# Patient Record
Sex: Male | Born: 1960 | Race: White | Hispanic: No | Smoking: Never smoker
Health system: Southern US, Community
[De-identification: ages and names within clinical notes are randomized; demographics above are authoritative.]

## PROBLEM LIST (undated history)

## (undated) DIAGNOSIS — I10 Essential (primary) hypertension: Secondary | ICD-10-CM

## (undated) DIAGNOSIS — D509 Iron deficiency anemia, unspecified: Secondary | ICD-10-CM

## (undated) DIAGNOSIS — A4902 Methicillin resistant Staphylococcus aureus infection, unspecified site: Secondary | ICD-10-CM

## (undated) DIAGNOSIS — R7303 Prediabetes: Secondary | ICD-10-CM

## (undated) DIAGNOSIS — M1990 Unspecified osteoarthritis, unspecified site: Secondary | ICD-10-CM

## (undated) HISTORY — PX: COLONOSCOPY: SHX174

## (undated) MED FILL — Dexamethasone Sodium Phosphate Inj 100 MG/10ML: INTRAMUSCULAR | Qty: 1 | Status: AC

---

## 2011-12-06 ENCOUNTER — Emergency Department (HOSPITAL_COMMUNITY)
Admission: EM | Admit: 2011-12-06 | Discharge: 2011-12-06 | Disposition: A | Discharge status: Home / Self Care | Payer: BC Managed Care – PPO | Attending: Emergency Medicine | Admitting: Emergency Medicine

## 2011-12-06 ENCOUNTER — Encounter (HOSPITAL_COMMUNITY): Payer: Self-pay | Admitting: *Deleted

## 2011-12-06 ENCOUNTER — Emergency Department (HOSPITAL_COMMUNITY): Payer: BC Managed Care – PPO

## 2011-12-06 DIAGNOSIS — IMO0002 Reserved for concepts with insufficient information to code with codable children: Secondary | ICD-10-CM | POA: Insufficient documentation

## 2011-12-06 DIAGNOSIS — Y929 Unspecified place or not applicable: Secondary | ICD-10-CM | POA: Insufficient documentation

## 2011-12-06 DIAGNOSIS — I10 Essential (primary) hypertension: Secondary | ICD-10-CM | POA: Insufficient documentation

## 2011-12-06 DIAGNOSIS — Y939 Activity, unspecified: Secondary | ICD-10-CM | POA: Insufficient documentation

## 2011-12-06 DIAGNOSIS — S43409A Unspecified sprain of unspecified shoulder joint, initial encounter: Secondary | ICD-10-CM

## 2011-12-06 DIAGNOSIS — R296 Repeated falls: Secondary | ICD-10-CM | POA: Insufficient documentation

## 2011-12-06 HISTORY — DX: Essential (primary) hypertension: I10

## 2011-12-06 MED ORDER — IBUPROFEN 800 MG PO TABS: 800.0000 mg | ORAL_TABLET | Freq: Three times a day (TID) | ORAL | Status: DC

## 2011-12-06 MED ORDER — OXYCODONE-ACETAMINOPHEN 5-325 MG PO TABS
2.0000 | ORAL_TABLET | Freq: Once | ORAL | Status: AC
  Administered 2011-12-06: 2 via ORAL
  Filled 2011-12-06: qty 2

## 2011-12-06 MED ORDER — OXYCODONE-ACETAMINOPHEN 5-325 MG PO TABS: 1.0000 | ORAL_TABLET | ORAL | Status: AC | PRN

## 2011-12-06 MED ORDER — OXYCODONE-ACETAMINOPHEN 5-325 MG PO TABS: ORAL_TABLET | ORAL | Status: DC

## 2011-12-06 NOTE — ED Notes (Signed)
Pt states tripped and fell, landed on rt shoulder, has pain radiating up, states unable to move arm.

## 2011-12-07 NOTE — ED Provider Notes (Signed)
Medical screening examination/treatment/procedure(s) were performed by non-physician practitioner and as supervising physician I was immediately available for consultation/collaboration.   Smaran Gaus L Shanta Dorvil, MD 12/07/11 2321 

## 2011-12-07 NOTE — ED Provider Notes (Signed)
History     CSN: [id number]  Arrival date & time 12/06/11  2001   First MD Initiated Contact with Patient 12/06/11 2024      Chief Complaint  Patient presents with  . Shoulder Injury    (Consider location/radiation/quality/duration/timing/severity/associated sxs/prior treatment) HPI Comments: Patient c/o right shoulder pain that began after a fall, landed on the shoulder.  Patient states he is unable to extend the right arm .  He denies numbness weakness, neck or chest pain.    Patient is a 51 y.o. male presenting with shoulder injury. The history is provided by the patient.  Shoulder Injury This is a new problem. The current episode started today. The problem occurs constantly. The problem has been unchanged. Associated symptoms include arthralgias. Pertinent negatives include no abdominal pain, chest pain, chills, fever, headaches, joint swelling, nausea, neck pain, numbness, rash, sore throat, swollen glands or weakness. Exacerbated by: movement or palpation. He has tried nothing for the symptoms. The treatment provided no relief.    Past Medical History  Diagnosis Date  . Hypertension     History reviewed. No pertinent past surgical history.  History reviewed. No pertinent family history.  History  Substance Use Topics  . Smoking status: Never Smoker   . Smokeless tobacco: Not on file  . Alcohol Use: No      Review of Systems  Constitutional: Negative for fever and chills.  HENT: Negative for sore throat and neck pain.   Respiratory: Negative for shortness of breath.   Cardiovascular: Negative for chest pain.  Gastrointestinal: Negative for nausea and abdominal pain.  Genitourinary: Negative for dysuria and difficulty urinating.  Musculoskeletal: Positive for arthralgias. Negative for back pain and joint swelling.  Skin: Negative for color change, rash and wound.  Neurological: Negative for weakness, numbness and headaches.  All other systems reviewed and are  negative.    Allergies  Codeine  Home Medications   Current Outpatient Rx  Name Route Sig Dispense Refill  . IBUPROFEN 800 MG PO TABS Oral Take 1 tablet (800 mg total) by mouth 3 (three) times daily. With food 21 tablet 0  . OXYCODONE-ACETAMINOPHEN 5-325 MG PO TABS Oral Take 1 tablet by mouth every 4 (four) hours as needed for pain. 6 tablet 0    Dispense as pre-pack  . OXYCODONE-ACETAMINOPHEN 5-325 MG PO TABS  Take one-two tabs po q 4-6 hrs prn pain 20 tablet 0    BP 169/90  Pulse 74  Temp 98.4 F (36.9 C) (Oral)  Resp 18  Ht 5\' 11"  (1.803 m)  Wt 279 lb (126.554 kg)  BMI 38.91 kg/m2  SpO2 98%  Physical Exam  Nursing note and vitals reviewed. Constitutional: He is oriented to person, place, and time. He appears well-developed and well-nourished. No distress.  HENT:  Head: Normocephalic and atraumatic.  Neck: Normal range of motion, full passive range of motion without pain and phonation normal. Neck supple. No thyromegaly present.  Cardiovascular: Normal rate, regular rhythm, normal heart sounds and intact distal pulses.   No murmur heard. Pulmonary/Chest: Effort normal and breath sounds normal. No respiratory distress. He exhibits no tenderness.  Musculoskeletal: He exhibits tenderness. He exhibits no edema.       Right shoulder: He exhibits decreased range of motion, tenderness, bony tenderness and pain. He exhibits no swelling, no effusion, no crepitus, no deformity, no laceration, no spasm, normal pulse and normal strength.       Arms:      ttp of the anterior right  shoulder.  Pain with abduction of the right arm and unable to fully abduct or rotate the shoulder due to level of pain.  Radial pulse is brisk, distal sensation intact, CR< 2 sec.  No abrasions, edema or deformity of the joint. Pt has full ROM of the elbow and wrist, grip strength is strong and equal bilaterally  Lymphadenopathy:    He has no cervical adenopathy.  Neurological: He is alert and oriented to  person, place, and time. He exhibits normal muscle tone. Coordination normal.  Skin: Skin is warm and dry.    ED Course  Procedures (including critical care time)  Labs Reviewed - No data to display Dg Shoulder Right  12/06/2011  *RADIOLOGY REPORT*  Clinical Data: Right shoulder pain/injury  RIGHT SHOULDER - 2+ VIEW  Comparison: None.  Findings: No fracture or dislocation is seen.  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  The joint spaces are preserved.  The visualized soft tissues are unremarkable.  Visualized right lung is clear.  IMPRESSION: No fracture or dislocation is seen.   Original Report Authenticated By: Charline Bills, M.D.      1. Shoulder sprain       MDM    Sling applied by nursing staff, pain improved, remains NV intact  Concerning for rotator cuff injury.  Pt agrees to close ortho f/u.  Referral given for Dr. Romeo Apple.  Prescribed: Percocet #20 ibuprofen     Alsha Meland L. [place], [place] 12/07/11 1637

## 2011-12-09 MED FILL — Oxycodone w/ Acetaminophen Tab 5-325 MG: ORAL | Qty: 6 | Status: AC

## 2013-06-18 ENCOUNTER — Encounter (HOSPITAL_COMMUNITY): Payer: Self-pay | Admitting: Emergency Medicine

## 2013-06-18 ENCOUNTER — Emergency Department (HOSPITAL_COMMUNITY)
Admission: EM | Admit: 2013-06-18 | Discharge: 2013-06-18 | Disposition: A | Discharge status: Home / Self Care | Payer: BC Managed Care – PPO | Attending: Emergency Medicine | Admitting: Emergency Medicine

## 2013-06-18 DIAGNOSIS — Y9389 Activity, other specified: Secondary | ICD-10-CM | POA: Insufficient documentation

## 2013-06-18 DIAGNOSIS — S335XXA Sprain of ligaments of lumbar spine, initial encounter: Secondary | ICD-10-CM | POA: Insufficient documentation

## 2013-06-18 DIAGNOSIS — I10 Essential (primary) hypertension: Secondary | ICD-10-CM | POA: Insufficient documentation

## 2013-06-18 DIAGNOSIS — X500XXA Overexertion from strenuous movement or load, initial encounter: Secondary | ICD-10-CM | POA: Insufficient documentation

## 2013-06-18 DIAGNOSIS — Y929 Unspecified place or not applicable: Secondary | ICD-10-CM | POA: Insufficient documentation

## 2013-06-18 DIAGNOSIS — Z791 Long term (current) use of non-steroidal anti-inflammatories (NSAID): Secondary | ICD-10-CM | POA: Insufficient documentation

## 2013-06-18 DIAGNOSIS — Z79899 Other long term (current) drug therapy: Secondary | ICD-10-CM | POA: Insufficient documentation

## 2013-06-18 DIAGNOSIS — R209 Unspecified disturbances of skin sensation: Secondary | ICD-10-CM | POA: Insufficient documentation

## 2013-06-18 DIAGNOSIS — S39012A Strain of muscle, fascia and tendon of lower back, initial encounter: Secondary | ICD-10-CM

## 2013-06-18 DIAGNOSIS — X503XXA Overexertion from repetitive movements, initial encounter: Secondary | ICD-10-CM | POA: Insufficient documentation

## 2013-06-18 MED ORDER — IBUPROFEN 800 MG PO TABS: 800.0000 mg | ORAL_TABLET | Freq: Three times a day (TID) | ORAL | Status: DC

## 2013-06-18 MED ORDER — METHOCARBAMOL 500 MG PO TABS: 1000.0000 mg | ORAL_TABLET | Freq: Three times a day (TID) | ORAL | Status: DC

## 2013-06-18 NOTE — ED Notes (Signed)
Patient given discharge instruction, verbalized understand. Patient ambulatory out of the department.  

## 2013-06-18 NOTE — Discharge Instructions (Signed)
Lumbosacral Strain Lumbosacral strain is a strain of any of the parts that make up your lumbosacral vertebrae. Your lumbosacral vertebrae are the bones that make up the lower third of your backbone. Your lumbosacral vertebrae are held together by muscles and tough, fibrous tissue (ligaments).  CAUSES  A sudden blow to your back can cause lumbosacral strain. Also, anything that causes an excessive stretch of the muscles in the low back can cause this strain. This is typically seen when people exert themselves strenuously, fall, lift heavy objects, bend, or crouch repeatedly. RISK FACTORS  Physically demanding work.  Participation in pushing or pulling sports or sports that require sudden twist of the back (tennis, golf, baseball).  Weight lifting.  Excessive lower back curvature.  Forward-tilted pelvis.  Weak back or abdominal muscles or both.  Tight hamstrings. SIGNS AND SYMPTOMS  Lumbosacral strain may cause pain in the area of your injury or pain that moves (radiates) down your leg.  DIAGNOSIS Your health care provider can often diagnose lumbosacral strain through a physical exam. In some cases, you may need tests such as X-ray exams.  TREATMENT  Treatment for your lower back injury depends on many factors that your clinician will have to evaluate. However, most treatment will include the use of anti-inflammatory medicines. HOME CARE INSTRUCTIONS   Avoid hard physical activities (tennis, racquetball, waterskiing) if you are not in proper physical condition for it. This may aggravate or create problems.  If you have a back problem, avoid sports requiring sudden body movements. Swimming and walking are generally safer activities.  Maintain good posture.  Maintain a healthy weight.  For acute conditions, you may put ice on the injured area.  Put ice in a plastic bag.  Place a towel between your skin and the bag.  Leave the ice on for 20 minutes, 2 3 times a day.  When the  low back starts healing, stretching and strengthening exercises may be recommended. SEEK MEDICAL CARE IF:  Your back pain is getting worse.  You experience severe back pain not relieved with medicines. SEEK IMMEDIATE MEDICAL CARE IF:   You have numbness, tingling, weakness, or problems with the use of your arms or legs.  There is a change in bowel or bladder control.  You have increasing pain in any area of the body, including your belly (abdomen).  You notice shortness of breath, dizziness, or feel faint.  You feel sick to your stomach (nauseous), are throwing up (vomiting), or become sweaty.  You notice discoloration of your toes or legs, or your feet get very cold. MAKE SURE YOU:   Understand these instructions.  Will watch your condition.  Will get help right away if you are not doing well or get worse. Document Released: 11/07/2004 Document Revised: 11/18/2012 Document Reviewed: 09/16/2012 [employer] Patient Information 2014 [employer].  

## 2013-06-18 NOTE — ED Notes (Signed)
Low back pain for 2 days, does a lot of lifting at work

## 2013-06-20 NOTE — ED Provider Notes (Signed)
Medical screening examination/treatment/procedure(s) were performed by non-physician practitioner and as supervising physician I was immediately available for consultation/collaboration.   EKG Interpretation None        Maudry Diego, MD 06/20/13 2132

## 2013-06-20 NOTE — ED Provider Notes (Signed)
CSN: [id number]     Arrival date & time 06/18/13  1855 History   First MD Initiated Contact with Patient 06/18/13 2038     Chief Complaint  Patient presents with  . Back Pain     (Consider location/radiation/quality/duration/timing/severity/associated sxs/prior Treatment) Patient is a 53 y.o. male presenting with back pain. The history is provided by the patient.  Back Pain Location:  Lumbar spine Quality:  Aching and stiffness Stiffness is present:  All day Radiates to:  Does not radiate Pain severity:  Moderate Pain is:  Same all the time Onset quality:  Gradual Duration:  2 days Timing:  Constant Progression:  Unchanged Chronicity:  New Context: lifting heavy objects and twisting   Relieved by:  Nothing Worsened by:  Bending, twisting, standing and sitting Ineffective treatments:  OTC medications Associated symptoms: paresthesias   Associated symptoms: no abdominal pain, no abdominal swelling, no bladder incontinence, no bowel incontinence, no chest pain, no dysuria, no fever, no headaches, no leg pain, no numbness, no pelvic pain, no perianal numbness, no tingling and no weakness     Past Medical History  Diagnosis Date  . Hypertension    History reviewed. No pertinent past surgical history. History reviewed. No pertinent family history. History  Substance Use Topics  . Smoking status: Never Smoker   . Smokeless tobacco: Not on file  . Alcohol Use: No    Review of Systems  Constitutional: Negative for fever.  Respiratory: Negative for shortness of breath.   Cardiovascular: Negative for chest pain.  Gastrointestinal: Negative for vomiting, abdominal pain, constipation and bowel incontinence.  Genitourinary: Negative for bladder incontinence, dysuria, hematuria, flank pain, decreased urine volume, difficulty urinating and pelvic pain.       No perineal numbness or incontinence of urine or feces  Musculoskeletal: Positive for back pain. Negative for joint swelling.   Skin: Negative for rash.  Neurological: Positive for paresthesias. Negative for tingling, weakness, numbness and headaches.  All other systems reviewed and are negative.     Allergies  Codeine  Home Medications   Prior to Admission medications   Medication Sig Start Date End Date Taking? Authorizing Provider  ibuprofen (ADVIL,MOTRIN) 800 MG tablet Take 1 tablet (800 mg total) by mouth 3 (three) times daily. With food 12/06/11   Breonna Gafford L. Jayzon Taras, PA-C  ibuprofen (ADVIL,MOTRIN) 800 MG tablet Take 1 tablet (800 mg total) by mouth 3 (three) times daily. 06/18/13   Markos Theil L. Vestal Markin, PA-C  methocarbamol (ROBAXIN) 500 MG tablet Take 2 tablets (1,000 mg total) by mouth 3 (three) times daily. 06/18/13   Devonia Farro L. Latia Mataya, PA-C  oxyCODONE-acetaminophen (PERCOCET/ROXICET) 5-325 MG per tablet Take one-two tabs po q 4-6 hrs prn pain 12/06/11   Murdock Jellison L. Arcelia Pals, PA-C   BP 169/85  Pulse 95  Temp(Src) 99.4 F (37.4 C) (Oral)  Resp 18  Ht 5\' 11"  (1.803 m)  Wt 265 lb (120.203 kg)  BMI 36.98 kg/m2  SpO2 96% Physical Exam  Nursing note and vitals reviewed. Constitutional: He is oriented to person, place, and time. He appears well-developed and well-nourished. No distress.  HENT:  Head: Normocephalic and atraumatic.  Neck: Normal range of motion. Neck supple.  Cardiovascular: Normal rate, regular rhythm, normal heart sounds and intact distal pulses.   No murmur heard. Pulmonary/Chest: Effort normal and breath sounds normal. No respiratory distress.  Abdominal: Soft. He exhibits no distension. There is no tenderness. There is no rebound and no guarding.  Musculoskeletal: He exhibits tenderness. He exhibits no edema.  Lumbar back: He exhibits tenderness and pain. He exhibits normal range of motion, no swelling, no deformity, no laceration and normal pulse.  Diffuse ttp of the lumbar paraspinal muscles.  No spinal tenderness.  DP pulses are brisk and symmetrical.  Distal sensation intact.  Hip  Flexors/Extensors are intact.  Pt has normal strength against resistance of bilateral lower extremities.     Neurological: He is alert and oriented to person, place, and time. He has normal strength. No sensory deficit. He exhibits normal muscle tone. Coordination and gait normal.  Reflex Scores:      Patellar reflexes are 2+ on the right side and 2+ on the left side.      Achilles reflexes are 2+ on the right side and 2+ on the left side. Skin: Skin is warm and dry. No rash noted.    ED Course  Procedures (including critical care time) Labs Review Labs Reviewed - No data to display  Imaging Review No results found.   EKG Interpretation None      MDM   Final diagnoses:  Lumbar strain     Pt is well appearing, no focal neuro deficits.  Ambulates with a steady gait.  No concerning sx's for emergent neurological or infectious process.  He agrees to symptomatic treatment with percocet, robaxin and ibuprofen   Hawraa Stambaugh L. Vanessa Chatham, PA-C 06/20/13 2001

## 2014-01-20 ENCOUNTER — Emergency Department (HOSPITAL_COMMUNITY)
Admission: EM | Admit: 2014-01-20 | Discharge: 2014-01-20 | Disposition: A | Discharge status: Home / Self Care | Payer: BC Managed Care – PPO | Attending: Emergency Medicine | Admitting: Emergency Medicine

## 2014-01-20 ENCOUNTER — Encounter (HOSPITAL_COMMUNITY): Payer: Self-pay | Admitting: Emergency Medicine

## 2014-01-20 DIAGNOSIS — J029 Acute pharyngitis, unspecified: Secondary | ICD-10-CM | POA: Diagnosis present

## 2014-01-20 DIAGNOSIS — I10 Essential (primary) hypertension: Secondary | ICD-10-CM | POA: Insufficient documentation

## 2014-01-20 DIAGNOSIS — Z79899 Other long term (current) drug therapy: Secondary | ICD-10-CM | POA: Insufficient documentation

## 2014-01-20 DIAGNOSIS — J069 Acute upper respiratory infection, unspecified: Secondary | ICD-10-CM | POA: Diagnosis not present

## 2014-01-20 MED ORDER — AMOXICILLIN 500 MG PO CAPS: 500.0000 mg | ORAL_CAPSULE | Freq: Three times a day (TID) | ORAL | Status: DC

## 2014-01-20 MED ORDER — PROMETHAZINE-PHENYLEPHRINE 6.25-5 MG/5ML PO SYRP: 10.0000 mL | ORAL_SOLUTION | Freq: Four times a day (QID) | ORAL | Status: DC | PRN

## 2014-01-20 MED ORDER — PREDNISONE 10 MG PO TABS: 20.0000 mg | ORAL_TABLET | Freq: Every day | ORAL | Status: DC

## 2014-01-20 NOTE — Discharge Instructions (Signed)
Please use salt water gargles 3 or 4 times daily. Please use ibuprofen every 6 hours. Please use Amoxil and prednisone daily with food. Use promethazine cough medication every 6 hours as needed for cough and congestion. This medication may cause drowsiness, please use with caution. Pharyngitis Pharyngitis is a sore throat (pharynx). There is redness, pain, and swelling of your throat. HOME CARE   Drink enough fluids to keep your pee (urine) clear or pale yellow.  Only take medicine as told by your doctor.  You may get sick again if you do not take medicine as told. Finish your medicines, even if you start to feel better.  Do not take aspirin.  Rest.  Rinse your mouth (gargle) with salt water ( tsp of salt per 1 qt of water) every 1-2 hours. This will help the pain.  If you are not at risk for choking, you can suck on hard candy or sore throat lozenges. GET HELP IF:  You have large, tender lumps on your neck.  You have a rash.  You cough up green, yellow-brown, or bloody spit. GET HELP RIGHT AWAY IF:   You have a stiff neck.  You drool or cannot swallow liquids.  You throw up (vomit) or are not able to keep medicine or liquids down.  You have very bad pain that does not go away with medicine.  You have problems breathing (not from a stuffy nose). MAKE SURE YOU:   Understand these instructions.  Will watch your condition.  Will get help right away if you are not doing well or get worse. Document Released: 07/17/2007 Document Revised: 11/18/2012 Document Reviewed: 10/05/2012 [facility] Patient Information 2015 [place], [place]. This information is not intended to replace advice given to you by your health care provider. Make sure you discuss any questions you have with your health care provider.  Upper Respiratory Infection, Adult An upper respiratory infection (URI) is also sometimes known as the common cold. The upper respiratory tract includes the nose, sinuses, throat,  trachea, and bronchi. Bronchi are the airways leading to the lungs. Most people improve within 1 week, but symptoms can last up to 2 weeks. A residual cough may last even longer.  CAUSES Many different viruses can infect the tissues lining the upper respiratory tract. The tissues become irritated and inflamed and often become very moist. Mucus production is also common. A cold is contagious. You can easily spread the virus to others by oral contact. This includes kissing, sharing a glass, coughing, or sneezing. Touching your mouth or nose and then touching a surface, which is then touched by another person, can also spread the virus. SYMPTOMS  Symptoms typically develop 1 to 3 days after you come in contact with a cold virus. Symptoms vary from person to person. They may include:  Runny nose.  Sneezing.  Nasal congestion.  Sinus irritation.  Sore throat.  Loss of voice (laryngitis).  Cough.  Fatigue.  Muscle aches.  Loss of appetite.  Headache.  Low-grade fever. DIAGNOSIS  You might diagnose your own cold based on familiar symptoms, since most people get a cold 2 to 3 times a year. Your caregiver can confirm this based on your exam. Most importantly, your caregiver can check that your symptoms are not due to another disease such as strep throat, sinusitis, pneumonia, asthma, or epiglottitis. Blood tests, throat tests, and X-rays are not necessary to diagnose a common cold, but they may sometimes be helpful in excluding other more serious diseases. Your caregiver  will decide if any further tests are required. RISKS AND COMPLICATIONS  You may be at risk for a more severe case of the common cold if you smoke cigarettes, have chronic heart disease (such as heart failure) or lung disease (such as asthma), or if you have a weakened immune system. The very young and very old are also at risk for more serious infections. Bacterial sinusitis, middle ear infections, and bacterial pneumonia can  complicate the common cold. The common cold can worsen asthma and chronic obstructive pulmonary disease (COPD). Sometimes, these complications can require emergency medical care and may be life-threatening. PREVENTION  The best way to protect against getting a cold is to practice good hygiene. Avoid oral or hand contact with people with cold symptoms. Wash your hands often if contact occurs. There is no clear evidence that vitamin C, vitamin E, echinacea, or exercise reduces the chance of developing a cold. However, it is always recommended to get plenty of rest and practice good nutrition. TREATMENT  Treatment is directed at relieving symptoms. There is no cure. Antibiotics are not effective, because the infection is caused by a virus, not by bacteria. Treatment may include:  Increased fluid intake. Sports drinks offer valuable electrolytes, sugars, and fluids.  Breathing heated mist or steam (vaporizer or shower).  Eating chicken soup or other clear broths, and maintaining good nutrition.  Getting plenty of rest.  Using gargles or lozenges for comfort.  Controlling fevers with ibuprofen or acetaminophen as directed by your caregiver.  Increasing usage of your inhaler if you have asthma. Zinc gel and zinc lozenges, taken in the first 24 hours of the common cold, can shorten the duration and lessen the severity of symptoms. Pain medicines may help with fever, muscle aches, and throat pain. A variety of non-prescription medicines are available to treat congestion and runny nose. Your caregiver can make recommendations and may suggest nasal or lung inhalers for other symptoms.  HOME CARE INSTRUCTIONS   Only take over-the-counter or prescription medicines for pain, discomfort, or fever as directed by your caregiver.  Use a warm mist humidifier or inhale steam from a shower to increase air moisture. This may keep secretions moist and make it easier to breathe.  Drink enough water and fluids to  keep your urine clear or pale yellow.  Rest as needed.  Return to work when your temperature has returned to normal or as your caregiver advises. You may need to stay home longer to avoid infecting others. You can also use a face mask and careful hand washing to prevent spread of the virus. SEEK MEDICAL CARE IF:   After the first few days, you feel you are getting worse rather than better.  You need your caregiver's advice about medicines to control symptoms.  You develop chills, worsening shortness of breath, or brown or red sputum. These may be signs of pneumonia.  You develop yellow or brown nasal discharge or pain in the face, especially when you bend forward. These may be signs of sinusitis.  You develop a fever, swollen neck glands, pain with swallowing, or white areas in the back of your throat. These may be signs of strep throat. SEEK IMMEDIATE MEDICAL CARE IF:   You have a fever.  You develop severe or persistent headache, ear pain, sinus pain, or chest pain.  You develop wheezing, a prolonged cough, cough up blood, or have a change in your usual mucus (if you have chronic lung disease).  You develop sore muscles or  a stiff neck. Document Released: 07/24/2000 Document Revised: 04/22/2011 Document Reviewed: 05/05/2013 [facility] Patient Information 2015 [place], [place]. This information is not intended to replace advice given to you by your health care provider. Make sure you discuss any questions you have with your health care provider.

## 2014-01-20 NOTE — ED Notes (Signed)
Pt c/o sore throat x 3 weeks, productive cough-yellow sputum.

## 2014-01-20 NOTE — ED Provider Notes (Signed)
CSN: [id number]     Arrival date & time 01/20/14  8502 History   First MD Initiated Contact with Patient 01/20/14 [phone]     Chief Complaint  Patient presents with  . Sore Throat     (Consider location/radiation/quality/duration/timing/severity/associated sxs/prior Treatment) Patient is a 53 y.o. male presenting with pharyngitis. The history is provided by the patient.  Sore Throat This is a new problem. The current episode started 1 to 4 weeks ago. The problem occurs intermittently. The problem has been gradually worsening. Associated symptoms include congestion, coughing, a rash and a sore throat. Pertinent negatives include no abdominal pain, arthralgias, chest pain, chills, fever, neck pain or vomiting. The symptoms are aggravated by swallowing. He has tried NSAIDs for the symptoms. The treatment provided no relief.    Past Medical History  Diagnosis Date  . Hypertension    History reviewed. No pertinent past surgical history. No family history on file. History  Substance Use Topics  . Smoking status: Never Smoker   . Smokeless tobacco: Not on file  . Alcohol Use: No    Review of Systems  Constitutional: Negative for fever, chills and activity change.       All ROS Neg except as noted in HPI  HENT: Positive for congestion and sore throat. Negative for nosebleeds.   Eyes: Negative for photophobia and discharge.  Respiratory: Positive for cough and wheezing. Negative for shortness of breath.   Cardiovascular: Negative for chest pain and palpitations.  Gastrointestinal: Negative for vomiting, abdominal pain and blood in stool.  Genitourinary: Negative for dysuria, frequency and hematuria.  Musculoskeletal: Negative for back pain, arthralgias and neck pain.  Skin: Positive for rash.  Neurological: Negative for dizziness, seizures and speech difficulty.  Psychiatric/Behavioral: Negative for hallucinations and confusion.      Allergies  Codeine  Home Medications   Prior  to Admission medications   Medication Sig Start Date End Date Taking? Authorizing Provider  ibuprofen (ADVIL,MOTRIN) 800 MG tablet Take 1 tablet (800 mg total) by mouth 3 (three) times daily. With food Patient not taking: Reported on 01/20/2014 12/06/11   Tammy L. Triplett, PA-C  ibuprofen (ADVIL,MOTRIN) 800 MG tablet Take 1 tablet (800 mg total) by mouth 3 (three) times daily. Patient not taking: Reported on 01/20/2014 06/18/13   Tammy L. Triplett, PA-C  methocarbamol (ROBAXIN) 500 MG tablet Take 2 tablets (1,000 mg total) by mouth 3 (three) times daily. Patient not taking: Reported on 01/20/2014 06/18/13   Tammy L. Triplett, PA-C  oxyCODONE-acetaminophen (PERCOCET/ROXICET) 5-325 MG per tablet Take one-two tabs po q 4-6 hrs prn pain Patient not taking: Reported on 01/20/2014 12/06/11   Tammy L. Triplett, PA-C   BP 187/93 mmHg  Pulse 69  Temp(Src) 98.4 F (36.9 C)  Resp 18  Ht 5\' 11"  (1.803 m)  Wt 270 lb (122.471 kg)  BMI 37.67 kg/m2  SpO2 96% Physical Exam  Constitutional: He is oriented to person, place, and time. He appears well-developed and well-nourished.  Non-toxic appearance.  HENT:  Head: Normocephalic.  Right Ear: Tympanic membrane and external ear normal.  Left Ear: Tympanic membrane and external ear normal.  Mouth/Throat: Uvula is midline. Uvula swelling present. Posterior oropharyngeal erythema present. No tonsillar abscesses.  Eyes: EOM and lids are normal. Pupils are equal, round, and reactive to light.  Neck: Normal range of motion. Neck supple. Carotid bruit is not present.  Cardiovascular: Normal rate, regular rhythm, normal heart sounds, intact distal pulses and normal pulses.   Pulmonary/Chest: Breath sounds normal. No  respiratory distress.  Abdominal: Soft. Bowel sounds are normal. There is no tenderness. There is no guarding.  Musculoskeletal: Normal range of motion.  Lymphadenopathy:       Head (right side): No submandibular adenopathy present.       Head (left  side): No submandibular adenopathy present.    He has no cervical adenopathy.  Neurological: He is alert and oriented to person, place, and time. He has normal strength. No cranial nerve deficit or sensory deficit.  Skin: Skin is warm and dry.  Psychiatric: He has a normal mood and affect. His speech is normal.  Nursing note and vitals reviewed.   ED Course  Procedures (including critical care time) Labs Review Labs Reviewed - No data to display  Imaging Review No results found.   EKG Interpretation None      MDM  Hx of sore throat for 3 weeks. Productive cough for the past week. Exam is consistent with pharyngitis and URI. Pt speaks in complete sentences. Pt can mobilize secretions without problem.  Rx for amoxil,prednisone, and promethazine VC cough medication given to the patient.   Final diagnoses:  Pharyngitis  URI (upper respiratory infection)    **I have reviewed nursing notes, vital signs, and all appropriate lab and imaging results for this patient.Lenox Ahr, PA-C 01/21/14 [place], MD 01/22/14 [phone]

## 2014-06-19 ENCOUNTER — Emergency Department (HOSPITAL_COMMUNITY)
Admission: EM | Admit: 2014-06-19 | Discharge: 2014-06-19 | Disposition: A | Discharge status: Home / Self Care | Payer: BC Managed Care – PPO | Attending: Emergency Medicine | Admitting: Emergency Medicine

## 2014-06-19 ENCOUNTER — Encounter (HOSPITAL_COMMUNITY): Payer: Self-pay | Admitting: *Deleted

## 2014-06-19 DIAGNOSIS — Z7952 Long term (current) use of systemic steroids: Secondary | ICD-10-CM | POA: Insufficient documentation

## 2014-06-19 DIAGNOSIS — Y998 Other external cause status: Secondary | ICD-10-CM | POA: Insufficient documentation

## 2014-06-19 DIAGNOSIS — Y9389 Activity, other specified: Secondary | ICD-10-CM | POA: Diagnosis not present

## 2014-06-19 DIAGNOSIS — Z792 Long term (current) use of antibiotics: Secondary | ICD-10-CM | POA: Insufficient documentation

## 2014-06-19 DIAGNOSIS — I10 Essential (primary) hypertension: Secondary | ICD-10-CM | POA: Diagnosis not present

## 2014-06-19 DIAGNOSIS — X58XXXA Exposure to other specified factors, initial encounter: Secondary | ICD-10-CM | POA: Insufficient documentation

## 2014-06-19 DIAGNOSIS — S3992XA Unspecified injury of lower back, initial encounter: Secondary | ICD-10-CM | POA: Diagnosis not present

## 2014-06-19 DIAGNOSIS — Y9289 Other specified places as the place of occurrence of the external cause: Secondary | ICD-10-CM | POA: Insufficient documentation

## 2014-06-19 DIAGNOSIS — M62830 Muscle spasm of back: Secondary | ICD-10-CM | POA: Diagnosis not present

## 2014-06-19 DIAGNOSIS — S39012A Strain of muscle, fascia and tendon of lower back, initial encounter: Secondary | ICD-10-CM

## 2014-06-19 MED ORDER — CYCLOBENZAPRINE HCL 10 MG PO TABS: 10.0000 mg | ORAL_TABLET | Freq: Three times a day (TID) | ORAL | Status: DC | PRN

## 2014-06-19 MED ORDER — OXYCODONE-ACETAMINOPHEN 5-325 MG PO TABS
2.0000 | ORAL_TABLET | Freq: Once | ORAL | Status: AC
  Administered 2014-06-19: 2 via ORAL
  Filled 2014-06-19: qty 2

## 2014-06-19 MED ORDER — CYCLOBENZAPRINE HCL 10 MG PO TABS
10.0000 mg | ORAL_TABLET | Freq: Once | ORAL | Status: AC
  Administered 2014-06-19: 10 mg via ORAL
  Filled 2014-06-19: qty 1

## 2014-06-19 MED ORDER — OXYCODONE-ACETAMINOPHEN 5-325 MG PO TABS: 1.0000 | ORAL_TABLET | ORAL | Status: DC | PRN

## 2014-06-19 NOTE — ED Provider Notes (Signed)
CSN: [id number]     Arrival date & time 06/19/14  2044 History   First MD Initiated Contact with Patient 06/19/14 2136     Chief Complaint  Patient presents with  . Back Pain     (Consider location/radiation/quality/duration/timing/severity/associated sxs/prior Treatment) HPI  Joseph Hogan is a 54 y.o. male who presents to the Emergency Department complaining of mid low back pain for 2 days.  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  He describes "aching" pain to his lower back with intermittent sharp pains that radiates into his left posterior thigh.  Pain is worse with standing upright, bending and twisting.  Pain improves slightly at rest.  He describes occasional "grabbing" pain to his lower back as well.  He denies abdominal pain, fever, chills, urine or bowel changes, numbness or weakness of the lower extremities.    Past Medical History  Diagnosis Date  . Hypertension    History reviewed. No pertinent past surgical history. History reviewed. No pertinent family history. History  Substance Use Topics  . Smoking status: Never Smoker   . Smokeless tobacco: Not on file  . Alcohol Use: No    Review of Systems  Constitutional: Negative for fever.  Respiratory: Negative for shortness of breath.   Gastrointestinal: Negative for vomiting, abdominal pain and constipation.  Genitourinary: Negative for dysuria, hematuria, flank pain, decreased urine volume and difficulty urinating.  Musculoskeletal: Positive for back pain. Negative for joint swelling.  Skin: Negative for rash.  Neurological: Negative for weakness and numbness.  All other systems reviewed and are negative.     Allergies  Codeine  Home Medications   Prior to Admission medications   Medication Sig Start Date End Date Taking? Authorizing Provider  amoxicillin (AMOXIL) 500 MG capsule Take 1 capsule (500 mg total) by mouth 3 (three)  times daily. 01/20/14   Lily Kocher, PA-C  methocarbamol (ROBAXIN) 500 MG tablet Take 2 tablets (1,000 mg total) by mouth 3 (three) times daily. Patient not taking: Reported on 01/20/2014 06/18/13   Blaise Grieshaber, PA-C  oxyCODONE-acetaminophen (PERCOCET/ROXICET) 5-325 MG per tablet Take one-two tabs po q 4-6 hrs prn pain Patient not taking: Reported on 01/20/2014 12/06/11   Karalyn Kadel, PA-C  predniSONE (DELTASONE) 10 MG tablet Take 2 tablets (20 mg total) by mouth daily. 01/20/14   Lily Kocher, PA-C  promethazine-phenylephrine (PROMETHAZINE VC) 6.25-5 MG/5ML SYRP Take 10 mLs by mouth every 6 (six) hours as needed for congestion. 01/20/14   Lily Kocher, PA-C   BP 170/88 mmHg  Pulse 64  Temp(Src) 98.2 F (36.8 C) (Oral)  Ht 5\' 11"  (1.803 m)  Wt 260 lb (117.935 kg)  BMI 36.28 kg/m2  SpO2 95% Physical Exam  Constitutional: He is oriented to person, place, and time. He appears well-developed and well-nourished. No distress.  HENT:  Head: Normocephalic and atraumatic.  Neck: Normal range of motion. Neck supple.  Cardiovascular: Normal rate, regular rhythm, normal heart sounds and intact distal pulses.   No murmur heard. Pulmonary/Chest: Effort normal and breath sounds normal. No respiratory distress.  Abdominal: Soft. He exhibits no distension. There is no tenderness.  Musculoskeletal: He exhibits tenderness. He exhibits no edema.       Lumbar back: He exhibits tenderness and pain. He exhibits normal range of motion, no swelling, no deformity, no laceration and normal pulse.  Diffuse ttp of the bilateral lumbar paraspinal muscles.  No spinal tenderness.  DP pulses  are brisk and symmetrical.  Distal sensation intact.  Hip Flexors/Extensors are intact.  Pt has 5/5 strength against resistance of bilateral lower extremities.     Neurological: He is alert and oriented to person, place, and time. He has normal strength. No sensory deficit. He exhibits normal muscle tone. Coordination and  gait normal.  Reflex Scores:      Patellar reflexes are 2+ on the right side and 2+ on the left side.      Achilles reflexes are 2+ on the right side and 2+ on the left side. Skin: Skin is warm and dry. No rash noted.  Nursing note and vitals reviewed.   ED Course  Procedures (including critical care time) Labs Review Labs Reviewed - No data to display  Imaging Review No results found.   EKG Interpretation None      MDM   Final diagnoses:  Lumbar strain, initial encounter  Muscle spasm of back   Pt is well appearing, non-toxic.  Ambulates with a steady gait.  No concerning sx's for emergent neurological or infectious process.  Pt agrees to symptomatic tx and close f/u with his PMD if needed.  Appears stable for d/c and he agrees to plan  Pt reviewed on the [place] narcotic database.  No recent Rx's filed.  Kem Parkinson, PA-C 06/20/14 1207  Nat Christen, MD 06/20/14 1452

## 2014-06-19 NOTE — ED Notes (Signed)
Pt c/o lower back pain and migraine x 2 days

## 2014-06-19 NOTE — Discharge Instructions (Signed)

## 2014-09-06 ENCOUNTER — Emergency Department (HOSPITAL_COMMUNITY)
Admission: EM | Admit: 2014-09-06 | Discharge: 2014-09-06 | Disposition: A | Discharge status: Home / Self Care | Payer: BC Managed Care – PPO | Attending: Emergency Medicine | Admitting: Emergency Medicine

## 2014-09-06 ENCOUNTER — Encounter (HOSPITAL_COMMUNITY): Payer: Self-pay

## 2014-09-06 DIAGNOSIS — L02414 Cutaneous abscess of left upper limb: Secondary | ICD-10-CM | POA: Diagnosis not present

## 2014-09-06 DIAGNOSIS — Z792 Long term (current) use of antibiotics: Secondary | ICD-10-CM | POA: Insufficient documentation

## 2014-09-06 DIAGNOSIS — R110 Nausea: Secondary | ICD-10-CM | POA: Diagnosis not present

## 2014-09-06 DIAGNOSIS — L02213 Cutaneous abscess of chest wall: Secondary | ICD-10-CM | POA: Diagnosis not present

## 2014-09-06 DIAGNOSIS — L02413 Cutaneous abscess of right upper limb: Secondary | ICD-10-CM | POA: Diagnosis not present

## 2014-09-06 DIAGNOSIS — Z7952 Long term (current) use of systemic steroids: Secondary | ICD-10-CM | POA: Insufficient documentation

## 2014-09-06 DIAGNOSIS — I10 Essential (primary) hypertension: Secondary | ICD-10-CM | POA: Insufficient documentation

## 2014-09-06 DIAGNOSIS — L0291 Cutaneous abscess, unspecified: Secondary | ICD-10-CM

## 2014-09-06 DIAGNOSIS — R5383 Other fatigue: Secondary | ICD-10-CM | POA: Insufficient documentation

## 2014-09-06 DIAGNOSIS — L02411 Cutaneous abscess of right axilla: Secondary | ICD-10-CM | POA: Diagnosis not present

## 2014-09-06 MED ORDER — HYDROCODONE-ACETAMINOPHEN 5-325 MG PO TABS: ORAL_TABLET | ORAL | Status: DC

## 2014-09-06 MED ORDER — SULFAMETHOXAZOLE-TRIMETHOPRIM 800-160 MG PO TABS
1.0000 | ORAL_TABLET | Freq: Once | ORAL | Status: AC
  Administered 2014-09-06: 1 via ORAL
  Filled 2014-09-06: qty 1

## 2014-09-06 MED ORDER — SULFAMETHOXAZOLE-TRIMETHOPRIM 800-160 MG PO TABS: 1.0000 | ORAL_TABLET | Freq: Two times a day (BID) | ORAL | Status: AC

## 2014-09-06 MED ORDER — TRAMADOL HCL 50 MG PO TABS
50.0000 mg | ORAL_TABLET | Freq: Once | ORAL | Status: AC
Start: 2014-09-06 — End: 2014-09-06
  Administered 2014-09-06: 50 mg via ORAL
  Filled 2014-09-06: qty 1

## 2014-09-06 MED ORDER — MUPIROCIN 2 % EX OINT: TOPICAL_OINTMENT | CUTANEOUS | Status: DC

## 2014-09-06 NOTE — ED Notes (Signed)
Pt reports multiple abscesses to both arm and under r arm x 4 weeks.

## 2014-09-06 NOTE — Discharge Instructions (Signed)
Abscess °An abscess (boil or furuncle) is an infected area on or under the skin. This area is filled with yellowish-white fluid (pus) and other material (debris). °HOME CARE  °· Only take medicines as told by your doctor. °· If you were given antibiotic medicine, take it as directed. Finish the medicine even if you start to feel better. °· If gauze is used, follow your doctor's directions for changing the gauze. °· To avoid spreading the infection: °¨ Keep your abscess covered with a bandage. °¨ Wash your hands well. °¨ Do not share personal care items, towels, or whirlpools with others. °¨ Avoid skin contact with others. °· Keep your skin and clothes clean around the abscess. °· Keep all doctor visits as told. °GET HELP RIGHT AWAY IF:  °· You have more pain, puffiness (swelling), or redness in the wound site. °· You have more fluid or blood coming from the wound site. °· You have muscle aches, chills, or you feel sick. °· You have a fever. °MAKE SURE YOU:  °· Understand these instructions. °· Will watch your condition. °· Will get help right away if you are not doing well or get worse. °Document Released: 07/17/2007 Document Revised: 07/30/2011 Document Reviewed: 04/12/2011 °Exit[employer]® Patient Information ©2015 [employer]. This information is not intended to replace advice given to you by your health care provider. Make sure you discuss any questions you have with your health care provider. ° °

## 2014-09-07 NOTE — ED Provider Notes (Signed)
CSN: [id number]     Arrival date & time 09/06/14  1719 History   First MD Initiated Contact with Patient 09/06/14 1733     Chief Complaint  Patient presents with  . Abscess     (Consider location/radiation/quality/duration/timing/severity/associated sxs/prior Treatment) HPI  Joseph Hogan is a 54 y.o. male who presents to the Emergency Department complaining of multiple abscesses to his chest and bilateral forearms.  He states this is a recurring problem and the areas will typically begin to spontaneously drain and heal then begin in another location.  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  He also complains of generalized fatigue and nausea.  He denies abd pain, fever, vomiting, edema or tick bites.  He has tried multiple home therapies without relief.  He denies diabetes or immunocomprised state  Past Medical History  Diagnosis Date  . Hypertension    History reviewed. No pertinent past surgical history. No family history on file. History  Substance Use Topics  . Smoking status: Never Smoker   . Smokeless tobacco: Not on file  . Alcohol Use: No    Review of Systems  Constitutional: Positive for fatigue. Negative for fever and chills.  Gastrointestinal: Positive for nausea. Negative for vomiting.  Musculoskeletal: Negative for joint swelling and arthralgias.  Skin: Positive for color change.       Abscess   Neurological: Negative for dizziness, weakness and numbness.  Hematological: Negative for adenopathy.  All other systems reviewed and are negative.     Allergies  Codeine  Home Medications   Prior to Admission medications   Medication Sig Start Date End Date Taking? Authorizing Provider  amoxicillin (AMOXIL) 500 MG capsule Take 1 capsule (500 mg total) by mouth 3 (three) times daily. 01/20/14   Lily Kocher, PA-C  cyclobenzaprine (FLEXERIL) 10 MG tablet Take 1 tablet (10 mg total) by mouth 3  (three) times daily as needed. 06/19/14   Krysti Hickling, PA-C  HYDROcodone-acetaminophen (NORCO/VICODIN) 5-325 MG per tablet Take one-two tabs po q 4-6 hrs prn pain 09/06/14   Gerene Nedd, PA-C  methocarbamol (ROBAXIN) 500 MG tablet Take 2 tablets (1,000 mg total) by mouth 3 (three) times daily. Patient not taking: Reported on 01/20/2014 06/18/13   Tonisha Silvey, PA-C  mupirocin ointment (BACTROBAN) 2 % Apply to the affected areas TID x 10 days 09/06/14   Arlow Spiers, PA-C  oxyCODONE-acetaminophen (PERCOCET/ROXICET) 5-325 MG per tablet Take 1 tablet by mouth every 4 (four) hours as needed. 06/19/14   Bret Vanessen, PA-C  predniSONE (DELTASONE) 10 MG tablet Take 2 tablets (20 mg total) by mouth daily. 01/20/14   Lily Kocher, PA-C  promethazine-phenylephrine (PROMETHAZINE VC) 6.25-5 MG/5ML SYRP Take 10 mLs by mouth every 6 (six) hours as needed for congestion. 01/20/14   Lily Kocher, PA-C  sulfamethoxazole-trimethoprim (BACTRIM DS,SEPTRA DS) 800-160 MG per tablet Take 1 tablet by mouth 2 (two) times daily. For 10 days 09/06/14 09/13/14  Aneth Schlagel, PA-C   BP 172/89 mmHg  Pulse 65  Temp(Src) 98.6 F (37 C) (Oral)  Resp 20  Ht 5\' 11"  (1.803 m)  Wt 265 lb (120.203 kg)  BMI 36.98 kg/m2  SpO2 97% Physical Exam  Constitutional: He is oriented to person, place, and time. He appears well-developed and well-nourished. No distress.  HENT:  Head: Normocephalic and atraumatic.  Cardiovascular: Normal rate, regular rhythm and normal heart sounds.   No murmur heard. Pulmonary/Chest: Effort normal and breath sounds  normal. No respiratory distress.  Neurological: He is alert and oriented to person, place, and time. He exhibits normal muscle tone. Coordination normal.  Skin: Skin is warm and dry. There is erythema.  Multiple small abscesses in various stages of healing to the chest and bilateral forearms with one 2 cm fluctuant abscess to the right axilla.  No active drainage  Nursing note and vitals  reviewed.   ED Course  Procedures (including critical care time) Labs Review Labs Reviewed  CULTURE, ROUTINE-ABSCESS    Imaging Review No results found.   EKG Interpretation None       Small pustule to the right forearm was cleaned with alcohol pads, then de-roofed using an 18g needle, abscess culture obtained  MDM   Final diagnoses:  Abscess of multiple sites    abscess culture pending.  Sx's likely related to MRSA.  Pt is well appearing, non-toxic.  Vitals stable.  He has multiple pustules to the chest and bilateral forearms in various stages of healing   2 cm abscess to right axilla that I recommended I&D, but patient prefers to try compresses and antibiotics.  I have advised him of risks of worsening sx's and he verbalized understanding, he agrees to return in 2-3 days if not improving.       Kem Parkinson, PA-C 09/07/14 1249  Veryl Speak, MD 09/07/14 2206

## 2014-09-09 ENCOUNTER — Telehealth (HOSPITAL_BASED_OUTPATIENT_CLINIC_OR_DEPARTMENT_OTHER): Payer: Self-pay

## 2014-09-11 LAB — CULTURE, ROUTINE-ABSCESS: Gram Stain: NONE SEEN

## 2014-09-12 ENCOUNTER — Telehealth (HOSPITAL_COMMUNITY): Payer: Self-pay

## 2014-09-13 ENCOUNTER — Telehealth: Payer: Self-pay | Admitting: *Deleted

## 2014-09-13 ENCOUNTER — Telehealth (HOSPITAL_COMMUNITY): Payer: Self-pay

## 2014-09-13 NOTE — ED Notes (Signed)
(+)  wound culture, treated with Sulfamethoxazole, OK per Andres Shad, Pharm

## 2014-09-14 ENCOUNTER — Telehealth (HOSPITAL_COMMUNITY): Payer: Self-pay

## 2014-09-14 NOTE — Telephone Encounter (Signed)
Unable to reach  X 3.  Letter sent to [facility] address.

## 2014-09-26 ENCOUNTER — Emergency Department (HOSPITAL_COMMUNITY)
Admission: EM | Admit: 2014-09-26 | Discharge: 2014-09-26 | Disposition: A | Discharge status: Home / Self Care | Payer: BC Managed Care – PPO | Attending: Emergency Medicine | Admitting: Emergency Medicine

## 2014-09-26 ENCOUNTER — Encounter (HOSPITAL_COMMUNITY): Payer: Self-pay | Admitting: Emergency Medicine

## 2014-09-26 DIAGNOSIS — Z8614 Personal history of Methicillin resistant Staphylococcus aureus infection: Secondary | ICD-10-CM | POA: Diagnosis not present

## 2014-09-26 DIAGNOSIS — L02211 Cutaneous abscess of abdominal wall: Secondary | ICD-10-CM | POA: Insufficient documentation

## 2014-09-26 DIAGNOSIS — I10 Essential (primary) hypertension: Secondary | ICD-10-CM | POA: Insufficient documentation

## 2014-09-26 HISTORY — DX: Methicillin resistant Staphylococcus aureus infection, unspecified site: A49.02

## 2014-09-26 LAB — COMPREHENSIVE METABOLIC PANEL
ALK PHOS: 49 U/L (ref 38–126)
ALT: 24 U/L (ref 17–63)
AST: 22 U/L (ref 15–41)
Albumin: 4.5 g/dL (ref 3.5–5.0)
Anion gap: 9 (ref 5–15)
BUN: 12 mg/dL (ref 6–20)
CALCIUM: 9.1 mg/dL (ref 8.9–10.3)
CO2: 26 mmol/L (ref 22–32)
CREATININE: 0.78 mg/dL (ref 0.61–1.24)
Chloride: 106 mmol/L (ref 101–111)
Glucose, Bld: 99 mg/dL (ref 65–99)
Potassium: 3.7 mmol/L (ref 3.5–5.1)
Sodium: 141 mmol/L (ref 135–145)
Total Bilirubin: 0.8 mg/dL (ref 0.3–1.2)
Total Protein: 7.6 g/dL (ref 6.5–8.1)

## 2014-09-26 LAB — CBC WITH DIFFERENTIAL/PLATELET
Basophils Absolute: 0 10*3/uL (ref 0.0–0.1)
Basophils Relative: 0 % (ref 0–1)
Eosinophils Absolute: 0 10*3/uL (ref 0.0–0.7)
Eosinophils Relative: 0 % (ref 0–5)
HEMATOCRIT: 40.3 % (ref 39.0–52.0)
HEMOGLOBIN: 13.7 g/dL (ref 13.0–17.0)
LYMPHS ABS: 1.2 10*3/uL (ref 0.7–4.0)
LYMPHS PCT: 18 % (ref 12–46)
MCH: 29.6 pg (ref 26.0–34.0)
MCHC: 34 g/dL (ref 30.0–36.0)
MCV: 87 fL (ref 78.0–100.0)
Monocytes Absolute: 0.5 10*3/uL (ref 0.1–1.0)
Monocytes Relative: 7 % (ref 3–12)
NEUTROS ABS: 5 10*3/uL (ref 1.7–7.7)
NEUTROS PCT: 75 % (ref 43–77)
Platelets: 200 10*3/uL (ref 150–400)
RBC: 4.63 MIL/uL (ref 4.22–5.81)
RDW: 12.5 % (ref 11.5–15.5)
WBC: 6.7 10*3/uL (ref 4.0–10.5)

## 2014-09-26 MED ORDER — SULFAMETHOXAZOLE-TRIMETHOPRIM 800-160 MG PO TABS: 1.0000 | ORAL_TABLET | Freq: Two times a day (BID) | ORAL | Status: AC

## 2014-09-26 MED ORDER — OXYCODONE-ACETAMINOPHEN 5-325 MG PO TABS: 1.0000 | ORAL_TABLET | ORAL | Status: DC | PRN

## 2014-09-26 MED ORDER — MUPIROCIN 2 % EX OINT: TOPICAL_OINTMENT | CUTANEOUS | Status: DC

## 2014-09-26 NOTE — ED Notes (Addendum)
Abscess to left upper abd x 3 days. States has h/s mrsa. Pt c/o "generally feeling bad"/nausea with this abscess. Pt also c/o some cp.

## 2014-09-26 NOTE — ED Notes (Signed)
MD at bedside. 

## 2014-09-26 NOTE — Discharge Instructions (Signed)
MRSA FAQs  What is MRSA?  Staphylococcus aureus (pronounced staff-ill-oh-KOK-us AW-ree-us), or "Staph" is a very common germ that about 1 out of every 3 people have on their skin or in their nose. This germ does not cause any problems for most people who have it on their skin. But sometimes it can cause serious infections such as skin or wound infections, pneumonia, or infections of the blood.   Antibiotics are given to kill Staph germs when they cause infections. Some Staph are resistant, meaning they cannot be killed by some antibiotics. "Methicillin-resistant Staphylococcus aureus" or "MRSA" is a type of Staph that is resistant to some of the antibiotics that are often used to treat Staph infections.  Who is most likely to get an MRSA infection?  In the hospital, people who are more likely to get an MRSA infection are people who:  · have other health conditions making them sick  · have been in the hospital or a nursing home  · have been treated with antibiotics.  People who are healthy and who have not been in the hospital or a nursing home can also get MRSA infections. These infections usually involve the skin. More information about this type of MRSA infection, known as "community-associated MRSA" infection, is available from the Centers for Disease Control and Prevention (CDC). http://www.cdc.gov/mrsa  How do I get an MRSA infection?  People who have MRSA germs on their skin or who are infected with MRSA may be able to spread the germ to other people. MRSA can be passed on to bed linens, bed rails, bathroom fixtures, and medical equipment. It can spread to other people on contaminated equipment and on the hands of doctors, nurses, other healthcare providers and visitors.  Can MRSA infections be treated?  Yes, there are antibiotics that can kill MRSA germs. Some patients with MRSA abscesses may need surgery to drain the infection. Your healthcare provider will determine which treatments are best for you.  What  are some of the things that hospitals are doing to prevent MRSA infections?  To prevent MRSA infections, doctors, nurses and other healthcare providers:  · Clean their hands with soap and water or an alcohol-based hand rub before and after caring for every patient.  · Carefully clean hospital rooms and medical equipment.  · Use Contact Precautions when caring for patients with MRSA. Contact Precautions mean:  ¨ Whenever possible, patients with MRSA will have a single room or will share a room only with someone else who also has MRSA.  ¨ Healthcare providers will put on gloves and wear a gown over their clothing while taking care of patients with MRSA.  ¨ Visitors may also be asked to wear a gown and gloves.  ¨ When leaving the room, hospital providers and visitors remove their gown and gloves and clean their hands.  ¨ Patients on Contact Precautions are asked to stay in their hospital rooms as much as possible. They should not go to common areas, such as the gift shop or cafeteria. They may go to other areas of the hospital for treatments and tests.  · May test some patients to see if they have MRSA on their skin. This test involves rubbing a cotton-tipped swab in the patient's nostrils or on the skin.  What can I do to help prevent MRSA infections?  In the hospital  · Make sure that all doctors, nurses, and other healthcare providers clean their hands with soap and water or an alcohol-based hand rub before   you have wounds or an intravascular device (such as a catheter or dialysis port) make sure that you know how to take care of them. Can my friends and family get MRSA when they visit me? The chance of getting MRSA while visiting a person who has MRSA is very low. To decrease the chance of getting MRSA your family and friends should:  Clean their hands before they enter your room and  when they leave.  Ask a healthcare provider if they need to wear protective gowns and gloves when they visit you. What do I need to do when I go home from the hospital? To prevent another MRSA infection and to prevent spreading MRSA to others:  Keep taking any antibiotics prescribed by your doctor. Don't take half-doses or stop before you complete your prescribed course.  Clean your hands often, especially before and after changing your wound dressing or bandage.  People who live with you should clean their hands often as well.  Keep any wounds clean and change bandages as instructed until healed.  Avoid sharing personal items such as towels or razors.  Wash and dry your clothes and bed linens in the warmest temperatures recommended on the labels.  Tell your healthcare providers that you have MRSA. This includes home health nurses and aides, therapists, and personnel in doctors' offices.  Your doctor may have more instructions for you. If you have questions, please ask your doctor or nurse. Developed and co-sponsored by [employer] for [place] [phone]); Infectious Diseases Society of [place] (IDSA); [place]; Association for Professionals in Infection Control and Epidemiology (APIC); Centers for Disease Control and Prevention (CDC); and [employer]. Document Released: 02/02/2013 Document Reviewed: 02/02/2013 [facility] Patient Information 2015 [place], [place]. This information is not intended to replace advice given to you by your health care provider. Make sure you discuss any questions you have with your health care provider.

## 2014-09-26 NOTE — ED Provider Notes (Signed)
CSN: [id number]     Arrival date & time 09/26/14  1536 History   First MD Initiated Contact with Patient 09/26/14 1710     Chief Complaint  Patient presents with  . Abscess      HPI Abscess to left upper abd x 3 days. States has h/s mrsa. Pt c/o "generally feeling bad" with this abscess.  Past Medical History  Diagnosis Date  . Hypertension   . MRSA (methicillin resistant Staphylococcus aureus)    History reviewed. No pertinent past surgical history. No family history on file. Social History  Substance Use Topics  . Smoking status: Never Smoker   . Smokeless tobacco: None  . Alcohol Use: No    Review of Systems  Constitutional: Negative for fever and chills.  All other systems reviewed and are negative.     Allergies  Codeine  Home Medications   Prior to Admission medications   Medication Sig Start Date End Date Taking? Authorizing Provider  mupirocin ointment (BACTROBAN) 2 % Apply to the nasal turbinates as directed BID x 5-10 days 09/26/14   Leonard Schwartz, MD  oxyCODONE-acetaminophen (PERCOCET/ROXICET) 5-325 MG per tablet Take 1 tablet by mouth every 4 (four) hours as needed. 09/26/14   Leonard Schwartz, MD  sulfamethoxazole-trimethoprim (BACTRIM DS,SEPTRA DS) 800-160 MG per tablet Take 1 tablet by mouth 2 (two) times daily. 09/26/14 10/03/14  Leonard Schwartz, MD   BP 173/88 mmHg  Pulse 67  Temp(Src) 98.1 F (36.7 C) (Oral)  Resp 18  Ht 5\' 11"  (1.803 m)  Wt 265 lb (120.203 kg)  BMI 36.98 kg/m2  SpO2 98% Physical Exam  Constitutional: He is oriented to person, place, and time. He appears well-developed and well-nourished. No distress.  HENT:  Head: Normocephalic and atraumatic.  Eyes: Pupils are equal, round, and reactive to light.  Neck: Normal range of motion.  Cardiovascular: Normal rate and intact distal pulses.   Pulmonary/Chest: No respiratory distress.  Abdominal: Normal appearance. He exhibits no distension.    Musculoskeletal: Normal range of motion.    Neurological: He is alert and oriented to person, place, and time. No cranial nerve deficit.  Skin: Skin is warm and dry. No rash noted.  Psychiatric: He has a normal mood and affect. His behavior is normal.  Nursing note and vitals reviewed.   ED Course  Procedures (including critical care time) Labs Review Labs Reviewed  CBC WITH DIFFERENTIAL/PLATELET  COMPREHENSIVE METABOLIC PANEL    Imaging Review No results found. I, [place], personally reviewed and evaluated these images and lab results as part of my medical decision-making.   EKG Interpretation   Date/Time:  Monday September 26 2014 15:53:24 EDT Ventricular Rate:  86 PR Interval:  162 QRS Duration: 90 QT Interval:  372 QTC Calculation: 445 R Axis:   -28 Text Interpretation:  Normal sinus rhythm Possible Left atrial enlargement  Borderline ECG Confirmed by Milia Warth  MD, Johnell Bas ([id number]) on 09/26/2014  5:10:39 PM      MDM   Final diagnoses:  Cutaneous abscess of abdominal wall        Leonard Schwartz, MD 09/26/14 1902

## 2014-10-03 ENCOUNTER — Telehealth (HOSPITAL_COMMUNITY): Payer: Self-pay

## 2014-10-03 NOTE — Telephone Encounter (Signed)
Unable to contact pt by mail or telephone. Unable to communicate lab results or treatment changes. 

## 2014-11-20 ENCOUNTER — Emergency Department (HOSPITAL_COMMUNITY)
Admission: EM | Admit: 2014-11-20 | Discharge: 2014-11-20 | Disposition: A | Discharge status: Home / Self Care | Payer: BC Managed Care – PPO | Attending: Emergency Medicine | Admitting: Emergency Medicine

## 2014-11-20 ENCOUNTER — Emergency Department (HOSPITAL_COMMUNITY): Payer: BC Managed Care – PPO

## 2014-11-20 ENCOUNTER — Encounter (HOSPITAL_COMMUNITY): Payer: Self-pay | Admitting: Emergency Medicine

## 2014-11-20 DIAGNOSIS — R42 Dizziness and giddiness: Secondary | ICD-10-CM | POA: Insufficient documentation

## 2014-11-20 DIAGNOSIS — R002 Palpitations: Secondary | ICD-10-CM

## 2014-11-20 DIAGNOSIS — R079 Chest pain, unspecified: Secondary | ICD-10-CM | POA: Insufficient documentation

## 2014-11-20 DIAGNOSIS — Z8614 Personal history of Methicillin resistant Staphylococcus aureus infection: Secondary | ICD-10-CM | POA: Insufficient documentation

## 2014-11-20 DIAGNOSIS — I10 Essential (primary) hypertension: Secondary | ICD-10-CM | POA: Insufficient documentation

## 2014-11-20 DIAGNOSIS — R51 Headache: Secondary | ICD-10-CM | POA: Insufficient documentation

## 2014-11-20 LAB — COMPREHENSIVE METABOLIC PANEL
ALT: 31 U/L (ref 17–63)
AST: 21 U/L (ref 15–41)
Albumin: 4.4 g/dL (ref 3.5–5.0)
Alkaline Phosphatase: 40 U/L (ref 38–126)
Anion gap: 8 (ref 5–15)
BUN: 17 mg/dL (ref 6–20)
CHLORIDE: 104 mmol/L (ref 101–111)
CO2: 26 mmol/L (ref 22–32)
Calcium: 8.8 mg/dL — ABNORMAL LOW (ref 8.9–10.3)
Creatinine, Ser: 0.66 mg/dL (ref 0.61–1.24)
GFR calc Af Amer: >60 mL/min (ref >60)
Glucose, Bld: 131 mg/dL — ABNORMAL HIGH (ref 65–99)
POTASSIUM: 3.9 mmol/L (ref 3.5–5.1)
SODIUM: 138 mmol/L (ref 135–145)
Total Bilirubin: 1 mg/dL (ref 0.3–1.2)
Total Protein: 7.5 g/dL (ref 6.5–8.1)

## 2014-11-20 LAB — CBC WITH DIFFERENTIAL/PLATELET
BASOS ABS: 0 10*3/uL (ref 0.0–0.1)
BASOS PCT: 0 %
EOS ABS: 0.1 10*3/uL (ref 0.0–0.7)
EOS PCT: 1 %
HCT: 43.1 % (ref 39.0–52.0)
Hemoglobin: 15.2 g/dL (ref 13.0–17.0)
LYMPHS PCT: 30 %
Lymphs Abs: 1.7 10*3/uL (ref 0.7–4.0)
MCH: 30.4 pg (ref 26.0–34.0)
MCHC: 35.3 g/dL (ref 30.0–36.0)
MCV: 86.2 fL (ref 78.0–100.0)
MONO ABS: 0.6 10*3/uL (ref 0.1–1.0)
Monocytes Relative: 10 %
Neutro Abs: 3.4 10*3/uL (ref 1.7–7.7)
Neutrophils Relative %: 59 %
PLATELETS: 189 10*3/uL (ref 150–400)
RBC: 5 MIL/uL (ref 4.22–5.81)
RDW: 12.6 % (ref 11.5–15.5)
WBC: 5.7 10*3/uL (ref 4.0–10.5)

## 2014-11-20 LAB — I-STAT TROPONIN, ED: TROPONIN I, POC: 0.01 ng/mL (ref 0.00–0.08)

## 2014-11-20 MED ORDER — OXYCODONE-ACETAMINOPHEN 5-325 MG PO TABS
1.0000 | ORAL_TABLET | Freq: Once | ORAL | Status: AC
  Administered 2014-11-20: 1 via ORAL
  Filled 2014-11-20: qty 1

## 2014-11-20 MED ORDER — ASPIRIN 325 MG PO TABS
325.0000 mg | ORAL_TABLET | Freq: Once | ORAL | Status: AC
  Administered 2014-11-20: 325 mg via ORAL
  Filled 2014-11-20: qty 1

## 2014-11-20 NOTE — ED Notes (Signed)
Family reports pt has been under a lot of stress and depression for past month due to recent divorce.

## 2014-11-20 NOTE — ED Provider Notes (Signed)
CSN: [id number]     Arrival date & time 11/20/14  2694 History  By signing my name below, I, Joseph Hogan, attest that this documentation has been prepared under the direction and in the presence of Milton Ferguson, MD. Electronically Signed: Hilda Hogan, ED Scribe. 11/20/2014. 8:46 AM.    Chief Complaint  Patient presents with  . Chest Pain  . Dizziness     Patient is a 54 y.o. male presenting with chest pain and dizziness. The history is provided by the patient. No language interpreter was used.  Chest Pain Pain location:  Unable to specify Pain radiates to:  Does not radiate Pain radiates to the back: no   Timing:  Intermittent Progression:  Unchanged Chronicity:  New Associated symptoms: dizziness, headache and palpitations   Associated symptoms: no abdominal pain, no back pain, no cough and no fatigue   Dizziness Associated symptoms: chest pain, headaches and palpitations   Associated symptoms: no diarrhea    HPI Comments: Joseph Hogan is a 54 y.o. male who presents to the Emergency Department complaining of intermittent, recurrent chest pain with associated palpitations, headache and dizziness that has been present for about a week. Pt states that his intermittent palpitations have been present for a week, which is longer than his dizziness and headache have been present. Pt states he just began taking blood pressure medication prescribed by his PCP two days ago. Prior to taking his blood pressure medication, he had palpitations, and the past two days once he took his medication, he began to have dizziness and a headache associated with his palpitations. Pt states he did not take his blood pressure medication this morning. Pt describes his heart beats as "fluttering". Pt describes his dizziness as more of a lightheaded feeling. Pt denies syncope and any other symptoms at this time.    Past Medical History  Diagnosis Date  . Hypertension   . MRSA (methicillin resistant  Staphylococcus aureus)    History reviewed. No pertinent past surgical history. History reviewed. No pertinent family history. Social History  Substance Use Topics  . Smoking status: Never Smoker   . Smokeless tobacco: None  . Alcohol Use: No    Review of Systems  Constitutional: Negative for appetite change and fatigue.  HENT: Negative for congestion, ear discharge and sinus pressure.   Eyes: Negative for discharge.  Respiratory: Negative for cough.   Cardiovascular: Positive for chest pain and palpitations.  Gastrointestinal: Negative for abdominal pain and diarrhea.  Genitourinary: Negative for frequency and hematuria.  Musculoskeletal: Negative for back pain.  Skin: Negative for rash.  Neurological: Positive for dizziness, light-headedness and headaches. Negative for seizures and syncope.  Psychiatric/Behavioral: Negative for hallucinations.      Allergies  Codeine  Home Medications   Prior to Admission medications   Medication Sig Start Date End Date Taking? Authorizing Provider  mupirocin ointment (BACTROBAN) 2 % Apply to the nasal turbinates as directed BID x 5-10 days 09/26/14   Leonard Schwartz, MD  oxyCODONE-acetaminophen (PERCOCET/ROXICET) 5-325 MG per tablet Take 1 tablet by mouth every 4 (four) hours as needed. 09/26/14   Leonard Schwartz, MD   Pulse 59  Temp(Src) 98.4 F (36.9 C) (Oral)  Resp 17  Ht 5\' 11"  (1.803 m)  Wt 255 lb (115.667 kg)  BMI 35.58 kg/m2  SpO2 99% Physical Exam  Constitutional: He is oriented to person, place, and time. He appears well-developed.  HENT:  Head: Normocephalic.  Eyes: Conjunctivae and EOM are normal. No scleral icterus.  Neck: Neck supple. No thyromegaly present.  Cardiovascular: Normal rate and regular rhythm.  Exam reveals no gallop and no friction rub.   No murmur heard. Pulmonary/Chest: No stridor. He has no wheezes. He has no rales. He exhibits no tenderness.  Abdominal: He exhibits no distension. There is no  tenderness. There is no rebound.  Musculoskeletal: Normal range of motion. He exhibits no edema.  Lymphadenopathy:    He has no cervical adenopathy.  Neurological: He is oriented to person, place, and time. He exhibits normal muscle tone. Coordination normal.  Skin: No rash noted. No erythema.  Psychiatric: He has a normal mood and affect. His behavior is normal.    ED Course  Procedures (including critical care time)  DIAGNOSTIC STUDIES: Oxygen Saturation is 99% on room air, normal by my interpretation.    COORDINATION OF CARE: 9:01 AM Discussed treatment plan with pt at bedside and pt agreed to plan.   Labs Review Labs Reviewed - No data to display  Imaging Review No results found. I have personally reviewed and evaluated these images and lab results as part of my medical decision-making.   EKG Interpretation   Date/Time:  Sunday November 20 2014 08:30:42 EDT Ventricular Rate:  62 PR Interval:  166 QRS Duration: 97 QT Interval:  399 QTC Calculation: 405 R Axis:   -34 Text Interpretation:  Sinus rhythm Left axis deviation Borderline T  abnormalities, lateral leads Confirmed by Sherrine Salberg  MD, Senai Ramnath ([id number]) on  11/20/2014 11:31:18 AM      MDM   Final diagnoses:  None    Patient presents with dizziness that started recently after he started his blood pressure medicine. He also presents with some palpitations that has been going on prior to taking his blood pressure medicine. Patient's EKG and labs and chest x-ray are unremarkable patient is not orthostatic. CT scan of the head shows a small lesion unsure if it is a stroke. Radiology suggests MRI. MRI not available here today. Patient feels better at discharge. I have instructed him to stop taking his blood pressure medicine for right now. Start a baby aspirin a day. He is to follow-up tomorrow to get an MRI of his brain. The results should be discussed with him in the emergency department tomorrow. If the MRI is negative he  can follow-up with his family doctor for the palpitations. And he can follow-up to discuss his blood pressure medicine. If the MRI shows he does have a recent stroke. he will need further workup  The chart was scribed for me under my direct supervision.  I personally performed the history, physical, and medical decision making and all procedures in the evaluation of this patient.Milton Ferguson, MD 11/20/14 [phone]

## 2014-11-20 NOTE — ED Notes (Addendum)
Pt C/O chest pain accompanied by dizziness and H/A x2 days. Pt reports pain comes and goes quickly in the right center chest area. Pt denies pain movement and sob. Pt diagnosed with hypertension 1 week ago and now is taking lisinopril

## 2014-11-20 NOTE — Discharge Instructions (Signed)
Take one baby aspirin a day.   Follow up tomorrow at the radiology department for mri brain,  Return to ER to discuss the results

## 2014-11-22 ENCOUNTER — Ambulatory Visit (HOSPITAL_COMMUNITY)
Admission: RE | Admit: 2014-11-22 | Discharge: 2014-11-22 | Disposition: A | Discharge status: Home / Self Care | Payer: Self-pay | Source: Ambulatory Visit | Attending: Emergency Medicine | Admitting: Emergency Medicine

## 2014-11-22 DIAGNOSIS — M542 Cervicalgia: Secondary | ICD-10-CM | POA: Insufficient documentation

## 2014-11-22 DIAGNOSIS — R51 Headache: Secondary | ICD-10-CM | POA: Insufficient documentation

## 2014-11-22 NOTE — ED Provider Notes (Signed)
Disc MRI brain results with patient.  He has primary care follow up.  Nat Christen, MD 11/22/14 [phone]

## 2015-01-10 ENCOUNTER — Ambulatory Visit: Payer: Self-pay | Admitting: Physician Assistant

## 2015-01-10 ENCOUNTER — Encounter: Payer: Self-pay | Admitting: Physician Assistant

## 2015-01-10 VITALS — BP 150/80 | HR 67 | Temp 98.1°F | Ht 69.0 in | Wt 256.9 lb

## 2015-01-10 DIAGNOSIS — I10 Essential (primary) hypertension: Secondary | ICD-10-CM

## 2015-01-10 DIAGNOSIS — L0291 Cutaneous abscess, unspecified: Secondary | ICD-10-CM

## 2015-01-10 DIAGNOSIS — Z131 Encounter for screening for diabetes mellitus: Secondary | ICD-10-CM

## 2015-01-10 DIAGNOSIS — E669 Obesity, unspecified: Secondary | ICD-10-CM

## 2015-01-10 LAB — GLUCOSE, POCT (MANUAL RESULT ENTRY): POC GLUCOSE: 151 mg/dL — AB (ref 70–99)

## 2015-01-10 MED ORDER — LISINOPRIL-HYDROCHLOROTHIAZIDE 20-12.5 MG PO TABS: 1.0000 | ORAL_TABLET | Freq: Every day | ORAL | Status: DC

## 2015-01-10 NOTE — Patient Instructions (Signed)
Warm compresses to early abscess on leg.   Return to office if worsens

## 2015-01-10 NOTE — Progress Notes (Signed)
BP 150/80 mmHg  Pulse 67  Temp(Src) 98.1 F (36.7 C)  Ht 5\' 9"  (1.753 m)  Wt 256 lb 14.4 oz (116.529 kg)  BMI 37.92 kg/m2  SpO2 95%   Subjective:    Patient ID: Joseph Hogan, male    DOB: [birth year], 54 y.o.   MRN: [id number]  HPI: Joseph Hogan is a 55 y.o. male presenting on 01/10/2015 for New Patient (Initial Visit)   HPI   Pt recently lost his insurance.  He was previously followed by Dr Wende Neighbors.  Pt states bump on R lower leg for past few days that he is concerned about.  He has had abscesses in the past. He says that he has never had to have I&D.  Relevant past medical, surgical, family and social history reviewed and updated as indicated. Interim medical history since our last visit reviewed. Allergies and medications reviewed and updated.  Current outpatient prescriptions:  .  Ascorbic Acid (VITAMIN C PO), Take 1 tablet by mouth daily., Disp: , Rfl:  .  Cholecalciferol (VITAMIN D PO), Take 1 tablet by mouth daily., Disp: , Rfl:  .  GARLIC PO, Take 1 tablet by mouth daily., Disp: , Rfl:  .  lisinopril-hydrochlorothiazide (PRINZIDE,ZESTORETIC) 10-12.5 MG tablet, Take 1 tablet by mouth daily., Disp: , Rfl:    Review of Systems  Constitutional: Negative for fever, chills, diaphoresis, appetite change, fatigue and unexpected weight change.  HENT: Positive for dental problem. Negative for congestion, drooling, ear pain, facial swelling, hearing loss, mouth sores, sneezing, sore throat, trouble swallowing and voice change.   Eyes: Negative for pain, discharge, redness, itching and visual disturbance.  Respiratory: Negative for cough, choking, shortness of breath and wheezing.   Cardiovascular: Negative for chest pain, palpitations and leg swelling.  Gastrointestinal: Negative for vomiting, abdominal pain, diarrhea, constipation and blood in stool.  Endocrine: Negative for cold intolerance, heat intolerance and polydipsia.  Genitourinary: Negative for dysuria,  hematuria and decreased urine volume.  Musculoskeletal: Negative for back pain, arthralgias and gait problem.  Skin: Negative for rash.  Allergic/Immunologic: Negative for environmental allergies.  Neurological: Negative for seizures, syncope, light-headedness and headaches.  Hematological: Negative for adenopathy.  Psychiatric/Behavioral: Negative for suicidal ideas, dysphoric mood and agitation. The patient is not nervous/anxious.     Per HPI unless specifically indicated above     Objective:    BP 150/80 mmHg  Pulse 67  Temp(Src) 98.1 F (36.7 C)  Ht 5\' 9"  (1.753 m)  Wt 256 lb 14.4 oz (116.529 kg)  BMI 37.92 kg/m2  SpO2 95%  Wt Readings from Last 3 Encounters:  01/10/15 256 lb 14.4 oz (116.529 kg)  11/20/14 255 lb (115.667 kg)  09/26/14 265 lb (120.203 kg)    Physical Exam  Constitutional: He is oriented to person, place, and time. He appears well-developed and well-nourished.  HENT:  Head: Normocephalic and atraumatic.  Mouth/Throat: Oropharynx is clear and moist. No oropharyngeal exudate.  Eyes: Conjunctivae and EOM are normal. Pupils are equal, round, and reactive to light.  Neck: Neck supple. No thyromegaly present.  Cardiovascular: Normal rate and regular rhythm.   Pulmonary/Chest: Effort normal and breath sounds normal. He has no wheezes. He has no rales.  Abdominal: Soft. Bowel sounds are normal. He exhibits no mass. There is no tenderness.  obese  Musculoskeletal: He exhibits no edema.  Lymphadenopathy:    He has no cervical adenopathy.  Neurological: He is alert and oriented to person, place, and time.  Skin: Skin is warm  and dry. Lesion noted. No rash noted.     Small bump lateral aspect of R lower leg. Bump is reddish and lightly raised. There is no fluctuance.    Psychiatric: He has a normal mood and affect. His behavior is normal. Thought content normal.  Vitals reviewed.   Results for orders placed or performed in visit on 01/10/15  POCT Glucose  (CBG)  Result Value Ref Range   POC Glucose 151 (A) 70 - 99 mg/dl      Assessment & Plan:   Encounter Diagnoses  Name Primary?  . Essential hypertension, benign Yes  . Screening for diabetes mellitus   . Abscess   . Obesity, unspecified      -Requested most recent labs from Dr Juel Burrow office.  If more are needed, will contact pt to go to lab -increase bp med -recommended pt to use warm compresses to early abscess on lower leg.  Pt is counseled to RTO if abscess worsens. No I&D needed at this point -f/u 1 mo to recheck bp

## 2015-02-09 ENCOUNTER — Ambulatory Visit: Payer: Self-pay | Admitting: Physician Assistant

## 2015-03-03 ENCOUNTER — Encounter: Payer: Self-pay | Admitting: Physician Assistant

## 2015-03-07 ENCOUNTER — Ambulatory Visit: Payer: Self-pay | Admitting: Physician Assistant

## 2015-03-07 ENCOUNTER — Encounter: Payer: Self-pay | Admitting: Physician Assistant

## 2015-03-07 VITALS — BP 154/80 | HR 66 | Temp 98.4°F | Ht 69.0 in | Wt 264.2 lb

## 2015-03-07 DIAGNOSIS — Z125 Encounter for screening for malignant neoplasm of prostate: Secondary | ICD-10-CM

## 2015-03-07 DIAGNOSIS — Z131 Encounter for screening for diabetes mellitus: Secondary | ICD-10-CM

## 2015-03-07 DIAGNOSIS — Z22322 Carrier or suspected carrier of Methicillin resistant Staphylococcus aureus: Secondary | ICD-10-CM

## 2015-03-07 DIAGNOSIS — E669 Obesity, unspecified: Secondary | ICD-10-CM

## 2015-03-07 DIAGNOSIS — I10 Essential (primary) hypertension: Secondary | ICD-10-CM

## 2015-03-07 DIAGNOSIS — Z13220 Encounter for screening for lipoid disorders: Secondary | ICD-10-CM

## 2015-03-07 MED ORDER — METOPROLOL TARTRATE 50 MG PO TABS: 50.0000 mg | ORAL_TABLET | Freq: Two times a day (BID) | ORAL | Status: DC

## 2015-03-07 MED ORDER — MUPIROCIN 2 % EX OINT: 1.0000 "application " | TOPICAL_OINTMENT | Freq: Two times a day (BID) | CUTANEOUS | Status: DC

## 2015-03-07 NOTE — Patient Instructions (Signed)
Continue lisinopril/hctz Add metoprolol Get blood drawn at [facility] in nose as discussed

## 2015-03-07 NOTE — Progress Notes (Signed)
BP 154/80 mmHg  Pulse 66  Temp(Src) 98.4 F (36.9 C)  Ht 5\' 9"  (1.753 m)  Wt 264 lb 3.2 oz (119.84 kg)  BMI 39.00 kg/m2  SpO2 94%   Subjective:    Patient ID: Joseph Hogan, male    DOB: 02/12/1960, 55 y.o.   MRN: [id number]  HPI: Joseph Hogan is a 55 y.o. male presenting on 03/07/2015 for Hypertension   HPI   Pt c/o anxiety about having MRSA  Pt states he checks his bp at home- states usually in the 150's occasionally in the upper 140's.  He denies CP and HA.    Never got labs as requested from Dr Juel Burrow office  Relevant past medical, surgical, family and social history reviewed and updated as indicated. Interim medical history since our last visit reviewed. Allergies and medications reviewed and updated.   Current outpatient prescriptions:  .  Ascorbic Acid (VITAMIN C PO), Take 1 tablet by mouth daily., Disp: , Rfl:  .  Cholecalciferol (VITAMIN D PO), Take 1 tablet by mouth daily., Disp: , Rfl:  .  GARLIC PO, Take 1 tablet by mouth daily., Disp: , Rfl:  .  lisinopril-hydrochlorothiazide (ZESTORETIC) 20-12.5 MG tablet, Take 1 tablet by mouth daily., Disp: 30 tablet, Rfl: 3 .  Multiple Vitamin (MULTI VITAMIN PO), Take by mouth daily., Disp: , Rfl:    Review of Systems  Constitutional: Negative for fever, chills, diaphoresis, appetite change, fatigue and unexpected weight change.  HENT: Positive for dental problem. Negative for congestion, drooling, ear pain, facial swelling, hearing loss, mouth sores, sneezing, sore throat, trouble swallowing and voice change.   Eyes: Negative for pain, discharge, redness, itching and visual disturbance.  Respiratory: Negative for cough, choking, shortness of breath and wheezing.   Cardiovascular: Negative for chest pain, palpitations and leg swelling.  Gastrointestinal: Negative for vomiting, abdominal pain, diarrhea, constipation and blood in stool.  Endocrine: Negative for cold intolerance, heat intolerance and polydipsia.   Genitourinary: Negative for dysuria, hematuria and decreased urine volume.  Musculoskeletal: Negative for back pain, arthralgias and gait problem.  Skin: Negative for rash.  Allergic/Immunologic: Negative for environmental allergies.  Neurological: Negative for seizures, syncope, light-headedness and headaches.  Hematological: Negative for adenopathy.  Psychiatric/Behavioral: Negative for suicidal ideas, dysphoric mood and agitation. The patient is nervous/anxious.     Per HPI unless specifically indicated above     Objective:    BP 154/80 mmHg  Pulse 66  Temp(Src) 98.4 F (36.9 C)  Ht 5\' 9"  (1.753 m)  Wt 264 lb 3.2 oz (119.84 kg)  BMI 39.00 kg/m2  SpO2 94%  Wt Readings from Last 3 Encounters:  03/07/15 264 lb 3.2 oz (119.84 kg)  01/10/15 256 lb 14.4 oz (116.529 kg)  11/20/14 255 lb (115.667 kg)    Physical Exam  Constitutional: He is oriented to person, place, and time. He appears well-developed and well-nourished.  HENT:  Head: Normocephalic and atraumatic.  Neck: Neck supple.  Cardiovascular: Normal rate and regular rhythm.   Pulmonary/Chest: Effort normal and breath sounds normal. He has no wheezes.  Musculoskeletal: He exhibits no edema.  Lymphadenopathy:    He has no cervical adenopathy.  Neurological: He is alert and oriented to person, place, and time.  Skin: Skin is warm and dry.  Small area L ant torso- no redness, no fluctuance. 0.5 cm area with thickened skin- looks like old scar tissue.  Psychiatric: He has a normal mood and affect. His behavior is normal.  Vitals reviewed.  Assessment & Plan:   Encounter Diagnoses  Name Primary?  . Essential hypertension, benign Yes  . Screening cholesterol level   . Screening for prostate cancer   . Screening for diabetes mellitus   . MRSA carrier   . Obesity, unspecified     -Continue lisinopril/hctz -Add metoprolol -Get blood drawn at [facility] in nose as discussed for mrsa  carrier -F/u 1 mo to recheck bp and review labs

## 2015-03-11 ENCOUNTER — Emergency Department (HOSPITAL_COMMUNITY)
Admission: EM | Admit: 2015-03-11 | Discharge: 2015-03-11 | Disposition: A | Discharge status: Home / Self Care | Payer: BC Managed Care – PPO | Attending: Emergency Medicine | Admitting: Emergency Medicine

## 2015-03-11 ENCOUNTER — Encounter (HOSPITAL_COMMUNITY): Payer: Self-pay | Admitting: Emergency Medicine

## 2015-03-11 DIAGNOSIS — Y93E5 Activity, floor mopping and cleaning: Secondary | ICD-10-CM | POA: Insufficient documentation

## 2015-03-11 DIAGNOSIS — W57XXXA Bitten or stung by nonvenomous insect and other nonvenomous arthropods, initial encounter: Secondary | ICD-10-CM | POA: Insufficient documentation

## 2015-03-11 DIAGNOSIS — Z79899 Other long term (current) drug therapy: Secondary | ICD-10-CM | POA: Insufficient documentation

## 2015-03-11 DIAGNOSIS — S20469A Insect bite (nonvenomous) of unspecified back wall of thorax, initial encounter: Secondary | ICD-10-CM | POA: Insufficient documentation

## 2015-03-11 DIAGNOSIS — Y92038 Other place in apartment as the place of occurrence of the external cause: Secondary | ICD-10-CM | POA: Insufficient documentation

## 2015-03-11 DIAGNOSIS — Y990 Civilian activity done for income or pay: Secondary | ICD-10-CM | POA: Insufficient documentation

## 2015-03-11 DIAGNOSIS — S80862A Insect bite (nonvenomous), left lower leg, initial encounter: Secondary | ICD-10-CM | POA: Insufficient documentation

## 2015-03-11 DIAGNOSIS — S80861A Insect bite (nonvenomous), right lower leg, initial encounter: Secondary | ICD-10-CM | POA: Insufficient documentation

## 2015-03-11 DIAGNOSIS — I10 Essential (primary) hypertension: Secondary | ICD-10-CM | POA: Insufficient documentation

## 2015-03-11 DIAGNOSIS — S1086XA Insect bite of other specified part of neck, initial encounter: Secondary | ICD-10-CM | POA: Insufficient documentation

## 2015-03-11 DIAGNOSIS — Z792 Long term (current) use of antibiotics: Secondary | ICD-10-CM | POA: Insufficient documentation

## 2015-03-11 DIAGNOSIS — Z8614 Personal history of Methicillin resistant Staphylococcus aureus infection: Secondary | ICD-10-CM | POA: Insufficient documentation

## 2015-03-11 MED ORDER — HYDROXYZINE HCL 25 MG PO TABS: 25.0000 mg | ORAL_TABLET | Freq: Four times a day (QID) | ORAL | Status: DC | PRN

## 2015-03-11 NOTE — ED Notes (Signed)
Patient c/o multiple ?bug bites on legs, arms, and back. Per patient area itch. Patient states he was cleaning out an extremely dirty apartment and feeling like "something was biting him." Per patient never saw anything.

## 2015-03-11 NOTE — ED Notes (Signed)
PA Julie Idol at bedside. 

## 2015-03-11 NOTE — ED Provider Notes (Signed)
CSN: [id number]     Arrival date & time 03/11/15  1654 History   First MD Initiated Contact with Patient 03/11/15 1713     Chief Complaint  Patient presents with  . Insect Bite     (Consider location/radiation/quality/duration/timing/severity/associated sxs/prior Treatment) The history is provided by the patient.   Joseph Hogan is a 55 y.o. male presenting with multiple itchy red lesions which he believes are insect bites, but has a history of mrsa and wants to rule out infection as the source.  He was cleaning out a very dirty apartment yesterday and during this job started feeling like something was biting him, but never saw any insects.  He has found no alleviators for his itching and resists taking benadryl as it makes him very sleepy.  Soaking his hands in hot water briefly helped the bites on his hand. There has been no drainage from the lesions.  He reports undergoing complete mrsa treatment including nasal antibiotics last year.      Past Medical History  Diagnosis Date  . Hypertension   . MRSA (methicillin resistant Staphylococcus aureus)    History reviewed. No pertinent past surgical history. Family History  Problem Relation Age of Onset  . Stroke Mother   . Hypertension Mother   . Heart disease Father   . Alzheimer's disease Father   . Hypertension Brother   . Alcohol abuse Brother   . Cancer Maternal Aunt   . Heart disease Maternal Aunt   . Cancer Maternal Uncle   . Heart disease Maternal Uncle   . Cancer Paternal Aunt   . Heart disease Paternal Aunt   . Cancer Paternal Uncle   . Heart disease Paternal Uncle   . Heart disease Paternal Grandmother   . Alzheimer's disease Paternal Grandfather    Social History  Substance Use Topics  . Smoking status: Never Smoker   . Smokeless tobacco: Never Used  . Alcohol Use: No    Review of Systems  Constitutional: Negative for fever and chills.  Respiratory: Negative for shortness of breath and wheezing.   Skin:  Positive for rash.  Neurological: Negative for numbness.      Allergies  Codeine  Home Medications   Prior to Admission medications   Medication Sig Start Date End Date Taking? Authorizing Provider  Ascorbic Acid (VITAMIN C PO) Take 1 tablet by mouth daily.    Historical Provider, MD  Cholecalciferol (VITAMIN D PO) Take 1 tablet by mouth daily.    Historical Provider, MD  GARLIC PO Take 1 tablet by mouth daily.    Historical Provider, MD  hydrOXYzine (ATARAX/VISTARIL) 25 MG tablet Take 1-2 tablets (25-50 mg total) by mouth every 6 (six) hours as needed for itching. 03/11/15   Evalee Jefferson, PA-C  lisinopril-hydrochlorothiazide (ZESTORETIC) 20-12.5 MG tablet Take 1 tablet by mouth daily. 01/10/15   Soyla Dryer, PA-C  metoprolol (LOPRESSOR) 50 MG tablet Take 1 tablet (50 mg total) by mouth 2 (two) times daily. 03/07/15   Soyla Dryer, PA-C  Multiple Vitamin (MULTI VITAMIN PO) Take by mouth daily.    Historical Provider, MD  mupirocin ointment (BACTROBAN) 2 % Place 1 application into the nose 2 (two) times daily. 03/07/15   Soyla Dryer, PA-C   BP 144/75 mmHg  Pulse 58  Temp(Src) 98.5 F (36.9 C) (Tympanic)  Resp 16  Ht 5\' 11"  (1.803 m)  Wt 119.75 kg  BMI 36.84 kg/m2  SpO2 99% Physical Exam  Constitutional: He appears well-developed and well-nourished.  HENT:  Head: Atraumatic.  Neck: Normal range of motion.  Cardiovascular:  Pulses equal bilaterally  Musculoskeletal: He exhibits no tenderness.  Neurological: He is alert. He has normal strength. He displays normal reflexes. No sensory deficit.  Skin: Skin is warm and dry. Rash noted. Rash is papular.  Scattered raised erythematous papules on back, arms, legs and neck c/w insect bites.  No pustules, no drainage.  Psychiatric: He has a normal mood and affect.    ED Course  Procedures (including critical care time) Labs Review Labs Reviewed - No data to display  Imaging Review No results found. I have personally  reviewed and evaluated these images and lab results as part of my medical decision-making.   EKG Interpretation None      MDM   Final diagnoses:  Multiple insect bites    Atarax, otc anti itch cream.  Prn f/u anticipated.    Evalee Jefferson, PA-C 03/11/15 [place], MD 03/12/15 [phone]

## 2015-04-06 ENCOUNTER — Ambulatory Visit: Payer: Self-pay | Admitting: Physician Assistant

## 2015-04-08 ENCOUNTER — Other Ambulatory Visit: Payer: Self-pay | Admitting: Physician Assistant

## 2015-04-08 LAB — COMPLETE METABOLIC PANEL WITH GFR
ALBUMIN: 4.1 g/dL (ref 3.6–5.1)
ALT: 33 U/L (ref 9–46)
AST: 17 U/L (ref 10–35)
Alkaline Phosphatase: 35 U/L — ABNORMAL LOW (ref 40–115)
BILIRUBIN TOTAL: 0.7 mg/dL (ref 0.2–1.2)
BUN: 14 mg/dL (ref 7–25)
CALCIUM: 8.9 mg/dL (ref 8.6–10.3)
CHLORIDE: 104 mmol/L (ref 98–110)
CO2: 23 mmol/L (ref 20–31)
CREATININE: 0.77 mg/dL (ref 0.70–1.33)
GFR, Est Non African American: >89 mL/min (ref >=60)
Glucose, Bld: 123 mg/dL — ABNORMAL HIGH (ref 65–99)
Potassium: 4.1 mmol/L (ref 3.5–5.3)
Sodium: 139 mmol/L (ref 135–146)
TOTAL PROTEIN: 6.4 g/dL (ref 6.1–8.1)

## 2015-04-08 LAB — LIPID PANEL
CHOLESTEROL: 169 mg/dL (ref 125–200)
HDL: 37 mg/dL — ABNORMAL LOW (ref >=40)
LDL Cholesterol: 109 mg/dL (ref <130)
TRIGLYCERIDES: 113 mg/dL (ref <150)
Total CHOL/HDL Ratio: 4.6 Ratio (ref <=5.0)
VLDL: 23 mg/dL (ref <30)

## 2015-04-08 LAB — HEMOGLOBIN A1C
Hgb A1c MFr Bld: 6.2 % — ABNORMAL HIGH (ref <5.7)
MEAN PLASMA GLUCOSE: 131 mg/dL — AB (ref <117)

## 2015-04-10 LAB — PSA: PSA: 1.43 ng/mL (ref <=4.00)

## 2015-04-11 ENCOUNTER — Ambulatory Visit: Payer: Self-pay | Admitting: Physician Assistant

## 2015-04-11 ENCOUNTER — Encounter: Payer: Self-pay | Admitting: Physician Assistant

## 2015-04-11 VITALS — BP 128/76 | HR 58 | Temp 97.9°F | Ht 69.0 in | Wt 264.4 lb

## 2015-04-11 DIAGNOSIS — R7303 Prediabetes: Secondary | ICD-10-CM

## 2015-04-11 DIAGNOSIS — E669 Obesity, unspecified: Secondary | ICD-10-CM

## 2015-04-11 DIAGNOSIS — E119 Type 2 diabetes mellitus without complications: Secondary | ICD-10-CM | POA: Insufficient documentation

## 2015-04-11 DIAGNOSIS — I10 Essential (primary) hypertension: Secondary | ICD-10-CM

## 2015-04-11 MED ORDER — METOPROLOL TARTRATE 25 MG PO TABS: 25.0000 mg | ORAL_TABLET | Freq: Two times a day (BID) | ORAL | Status: DC

## 2015-04-11 NOTE — Patient Instructions (Signed)
Prediabetes Eating Plan Prediabetes--also called impaired glucose tolerance or impaired fasting glucose--is a condition that causes blood sugar (blood glucose) levels to be higher than normal. Following a healthy diet can help to keep prediabetes under control. It can also help to lower the risk of type 2 diabetes and heart disease, which are increased in people who have prediabetes. Along with regular exercise, a healthy diet:  Promotes weight loss.  Helps to control blood sugar levels.  Helps to improve the way that the body uses insulin. WHAT DO I NEED TO KNOW ABOUT THIS EATING PLAN?  Use the glycemic index (GI) to plan your meals. The index tells you how quickly a food will raise your blood sugar. Choose low-GI foods. These foods take a longer time to raise blood sugar.  Pay close attention to the amount of carbohydrates in the food that you eat. Carbohydrates increase blood sugar levels.  Keep track of how many calories you take in. Eating the right amount of calories will help you to achieve a healthy weight. Losing about 7 percent of your starting weight can help to prevent type 2 diabetes.  You may want to follow a Mediterranean diet. This diet includes a lot of vegetables, lean meats or fish, whole grains, fruits, and healthy oils and fats. WHAT FOODS CAN I EAT? Grains Whole grains, such as whole-wheat or whole-grain breads, crackers, cereals, and pasta. Unsweetened oatmeal. Bulgur. Barley. Quinoa. Brown rice. Corn or whole-wheat flour tortillas or taco shells. Vegetables Lettuce. Spinach. Peas. Beets. Cauliflower. Cabbage. Broccoli. Carrots. Tomatoes. Squash. Eggplant. Herbs. Peppers. Onions. Cucumbers. Brussels sprouts. Fruits Berries. Bananas. Apples. Oranges. Grapes. Papaya. Mango. Pomegranate. Kiwi. Grapefruit. Cherries. Meats and Other Protein Sources Seafood. Lean meats, such as chicken and turkey or lean cuts of pork and beef. Tofu. Eggs. Nuts. Beans. Dairy Low-fat or  fat-free dairy products, such as yogurt, cottage cheese, and cheese. Beverages Water. Tea. Coffee. Sugar-free or diet soda. Seltzer water. Milk. Milk alternatives, such as soy or almond milk. Condiments Mustard. Relish. Low-fat, low-sugar ketchup. Low-fat, low-sugar barbecue sauce. Low-fat or fat-free mayonnaise. Sweets and Desserts Sugar-free or low-fat pudding. Sugar-free or low-fat ice cream and other frozen treats. Fats and Oils Avocado. Walnuts. Olive oil. The items listed above may not be a complete list of recommended foods or beverages. Contact your dietitian for more options.  WHAT FOODS ARE NOT RECOMMENDED? Grains Refined white flour and flour products, such as bread, pasta, snack foods, and cereals. Beverages Sweetened drinks, such as sweet iced tea and soda. Sweets and Desserts Baked goods, such as cake, cupcakes, pastries, cookies, and cheesecake. The items listed above may not be a complete list of foods and beverages to avoid. Contact your dietitian for more information.   This information is not intended to replace advice given to you by your health care provider. Make sure you discuss any questions you have with your health care provider.   Document Released: 06/14/2014 Document Reviewed: 06/14/2014 Elsevier Interactive Patient Education 2016 [employer]  

## 2015-04-11 NOTE — Progress Notes (Signed)
BP 128/76 mmHg  Pulse 58  Temp(Src) 97.9 F (36.6 C)  Ht 5\' 9"  (1.753 m)  Wt 264 lb 6.4 oz (119.931 kg)  BMI 39.03 kg/m2  SpO2 97%   Subjective:    Patient ID: Joseph Hogan, male    DOB: [birth year], 55 y.o.   MRN: [id number]  HPI: Joseph Hogan is a 55 y.o. 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 Interim medical history since our last visit reviewed. Allergies and medications reviewed and updated.  Current outpatient prescriptions:  .  Ascorbic Acid (VITAMIN C PO), Take 1 tablet by mouth daily., Disp: , Rfl:  .  Cholecalciferol (VITAMIN D PO), Take 1 tablet by mouth daily., Disp: , Rfl:  .  GARLIC PO, Take 1 tablet by mouth daily., Disp: , Rfl:  .  lisinopril-hydrochlorothiazide (ZESTORETIC) 20-12.5 MG tablet, Take 1 tablet by mouth daily., Disp: 30 tablet, Rfl: 3 .  metoprolol (LOPRESSOR) 50 MG tablet, Take 1 tablet (50 mg total) by mouth 2 (two) times daily., Disp: 60 tablet, Rfl: 3 .  Multiple Vitamin (MULTI VITAMIN PO), Take by mouth daily., Disp: , Rfl:  .  mupirocin ointment (BACTROBAN) 2 %, Place 1 application into the nose 2 (two) times daily., Disp: 22 g, Rfl: 0   Review of Systems  Constitutional: Negative for fever, chills, diaphoresis, appetite change, fatigue and unexpected weight change.  HENT: Positive for dental problem. Negative for congestion, drooling, ear pain, facial swelling, hearing loss, mouth sores, sneezing, sore throat, trouble swallowing and voice change.   Eyes: Negative for pain, discharge, redness, itching and visual disturbance.  Respiratory: Negative for cough, choking, shortness of breath and wheezing.   Cardiovascular: Negative for chest pain, palpitations and leg swelling.  Gastrointestinal: Negative for vomiting, abdominal pain, diarrhea,  constipation and blood in stool.  Endocrine: Negative for cold intolerance, heat intolerance and polydipsia.  Genitourinary: Negative for dysuria, hematuria and decreased urine volume.  Musculoskeletal: Negative for back pain, arthralgias and gait problem.  Skin: Negative for rash.  Allergic/Immunologic: Negative for environmental allergies.  Neurological: Negative for seizures, syncope, light-headedness and headaches.  Hematological: Negative for adenopathy.  Psychiatric/Behavioral: Negative for suicidal ideas, dysphoric mood and agitation. The patient is nervous/anxious.     Per HPI unless specifically indicated above     Objective:    BP 128/76 mmHg  Pulse 58  Temp(Src) 97.9 F (36.6 C)  Ht 5\' 9"  (1.753 m)  Wt 264 lb 6.4 oz (119.931 kg)  BMI 39.03 kg/m2  SpO2 97%  Wt Readings from Last 3 Encounters:  04/11/15 264 lb 6.4 oz (119.931 kg)  03/11/15 264 lb (119.75 kg)  03/07/15 264 lb 3.2 oz (119.84 kg)    Physical Exam  Constitutional: He is oriented to person, place, and time. He appears well-developed and well-nourished.  HENT:  Head: Normocephalic and atraumatic.  Neck: Neck supple.  Cardiovascular: Normal rate and regular rhythm.   Pulmonary/Chest: Effort normal and breath sounds normal. He has no wheezes.  Abdominal: Soft. Bowel sounds are normal. There is no hepatosplenomegaly. There is no tenderness.  obese  Musculoskeletal: He exhibits no edema.  Lymphadenopathy:    He has no cervical adenopathy.  Neurological: He is alert and oriented to person, place, and time.  Skin: Skin is warm and dry.  Psychiatric:  He has a normal mood and affect. His behavior is normal.  Vitals reviewed.   Results for orders placed or performed in visit on 04/08/15  COMPLETE METABOLIC PANEL WITH GFR  Result Value Ref Range   Sodium 139 135 - 146 mmol/L   Potassium 4.1 3.5 - 5.3 mmol/L   Chloride 104 98 - 110 mmol/L   CO2 23 20 - 31 mmol/L   Glucose, Bld 123 (H) 65 - 99 mg/dL    BUN 14 7 - 25 mg/dL   Creat 0.77 0.70 - 1.33 mg/dL   Total Bilirubin 0.7 0.2 - 1.2 mg/dL   Alkaline Phosphatase 35 (L) 40 - 115 U/L   AST 17 10 - 35 U/L   ALT 33 9 - 46 U/L   Total Protein 6.4 6.1 - 8.1 g/dL   Albumin 4.1 3.6 - 5.1 g/dL   Calcium 8.9 8.6 - 10.3 mg/dL   GFR, Est African American >89 >=60 mL/min   GFR, Est Non African American >89 >=60 mL/min  Lipid panel  Result Value Ref Range   Cholesterol 169 125 - 200 mg/dL   Triglycerides 113 <150 mg/dL   HDL 37 (L) >=40 mg/dL   Total CHOL/HDL Ratio 4.6 <=5.0 Ratio   VLDL 23 <30 mg/dL   LDL Cholesterol 109 <130 mg/dL  Hemoglobin A1c  Result Value Ref Range   Hgb A1c MFr Bld 6.2 (H) <5.7 %   Mean Plasma Glucose 131 (H) <117 mg/dL  PSA  Result Value Ref Range   PSA 1.43 <=4.00 ng/mL      Assessment & Plan:   Encounter Diagnoses  Name Primary?  . Essential hypertension, benign Yes  . Prediabetes   . Obesity, unspecified     -Reviewed labs with pt -Discussed that Needs metoprolol bid.  Decrease to 25mg  and take it bid. Pt states understanding -Discussed and counseled on prediabetes- recommend diet and exercise. Gave handouts on prediabetes and prediabetes diet. -f/u 3 months for bp.  Discussed with pt that we will recheck his a1c in one year.

## 2015-04-20 ENCOUNTER — Other Ambulatory Visit: Payer: Self-pay | Admitting: Physician Assistant

## 2015-04-20 MED ORDER — LISINOPRIL-HYDROCHLOROTHIAZIDE 20-12.5 MG PO TABS: 1.0000 | ORAL_TABLET | Freq: Every day | ORAL | Status: DC

## 2015-04-27 ENCOUNTER — Encounter: Payer: Self-pay | Admitting: Physician Assistant

## 2015-04-27 ENCOUNTER — Ambulatory Visit: Payer: Self-pay | Admitting: Physician Assistant

## 2015-04-27 VITALS — BP 148/84 | HR 52 | Temp 97.9°F | Ht 69.0 in | Wt 266.6 lb

## 2015-04-27 DIAGNOSIS — R05 Cough: Secondary | ICD-10-CM

## 2015-04-27 DIAGNOSIS — R059 Cough, unspecified: Secondary | ICD-10-CM

## 2015-04-27 DIAGNOSIS — I10 Essential (primary) hypertension: Secondary | ICD-10-CM

## 2015-04-27 MED ORDER — METOPROLOL TARTRATE 50 MG PO TABS: 50.0000 mg | ORAL_TABLET | Freq: Two times a day (BID) | ORAL | Status: DC

## 2015-04-27 NOTE — Progress Notes (Signed)
BP 148/84 mmHg  Pulse 52  Temp(Src) 97.9 F (36.6 C)  Ht 5\' 9"  (1.753 m)  Wt 266 lb 9.6 oz (120.929 kg)  BMI 39.35 kg/m2  SpO2 96%   Subjective:    Patient ID: Joseph Hogan, male    DOB: 05/05/1960, 55 y.o.   MRN: [id number]  HPI: Joseph Hogan is a 55 y.o. male presenting on 04/27/2015 for Cough   HPI Chief Complaint  Patient presents with  . Cough    pt believes it is due to lisinopril. pt states last time he took lisinopril was sunday and his cough has improved    Relevant past medical, surgical, family and social history reviewed and updated as indicated. Interim medical history since our last visit reviewed. Allergies and medications reviewed and updated.   Current outpatient prescriptions:  .  Ascorbic Acid (VITAMIN C PO), Take 1 tablet by mouth daily., Disp: , Rfl:  .  B Complex Vitamins (VITAMIN B COMPLEX PO), Take 1 tablet by mouth daily., Disp: , Rfl:  .  Cholecalciferol (VITAMIN D PO), Take 1 tablet by mouth daily., Disp: , Rfl:  .  GARLIC PO, Take 1 tablet by mouth daily., Disp: , Rfl:  .  metoprolol tartrate (LOPRESSOR) 25 MG tablet, Take 1 tablet (25 mg total) by mouth 2 (two) times daily., Disp: 60 tablet, Rfl: 3 .  Multiple Vitamin (MULTI VITAMIN PO), Take by mouth daily., Disp: , Rfl:  .  mupirocin ointment (BACTROBAN) 2 %, Place 1 application into the nose 2 (two) times daily., Disp: 22 g, Rfl: 0 .  lisinopril-hydrochlorothiazide (ZESTORETIC) 20-12.5 MG tablet, Take 1 tablet by mouth daily. (Patient not taking: Reported on 04/27/2015), Disp: 30 tablet, Rfl: 3   Review of Systems  Constitutional: Negative for fever, chills, diaphoresis, appetite change, fatigue and unexpected weight change.  HENT: Negative for congestion, dental problem, drooling, ear pain, facial swelling, hearing loss, mouth sores, sneezing, sore throat, trouble swallowing and voice change.   Eyes: Negative for pain, discharge, redness, itching and visual disturbance.  Respiratory:  Positive for cough. Negative for choking, shortness of breath and wheezing.   Cardiovascular: Negative for chest pain, palpitations and leg swelling.  Gastrointestinal: Negative for vomiting, abdominal pain, diarrhea, constipation and blood in stool.  Endocrine: Negative for cold intolerance, heat intolerance and polydipsia.  Genitourinary: Negative for dysuria, hematuria and decreased urine volume.  Musculoskeletal: Negative for back pain, arthralgias and gait problem.  Skin: Negative for rash.  Allergic/Immunologic: Negative for environmental allergies.  Neurological: Negative for seizures, syncope, light-headedness and headaches.  Hematological: Negative for adenopathy.  Psychiatric/Behavioral: Negative for suicidal ideas, dysphoric mood and agitation. The patient is not nervous/anxious.     Per HPI unless specifically indicated above     Objective:    BP 148/84 mmHg  Pulse 52  Temp(Src) 97.9 F (36.6 C)  Ht 5\' 9"  (1.753 m)  Wt 266 lb 9.6 oz (120.929 kg)  BMI 39.35 kg/m2  SpO2 96%  Wt Readings from Last 3 Encounters:  04/27/15 266 lb 9.6 oz (120.929 kg)  04/11/15 264 lb 6.4 oz (119.931 kg)  03/11/15 264 lb (119.75 kg)    Physical Exam  Constitutional: He is oriented to person, place, and time. He appears well-developed and well-nourished.  HENT:  Head: Normocephalic and atraumatic.  Neck: Neck supple.  Cardiovascular: Normal rate and regular rhythm.   Pulmonary/Chest: Effort normal and breath sounds normal. He has no wheezes.  Abdominal: Soft. Bowel sounds are normal. There is no  hepatosplenomegaly. There is no tenderness.  Musculoskeletal: He exhibits no edema.  Lymphadenopathy:    He has no cervical adenopathy.  Neurological: He is alert and oriented to person, place, and time.  Skin: Skin is warm and dry.  Psychiatric: He has a normal mood and affect. His behavior is normal.  Vitals reviewed.       Assessment & Plan:     Encounter Diagnoses  Name Primary?   . Essential hypertension, benign Yes  . Cough    Stop the lisinopril.  rx metoprolol.  F/u 71month to check bp

## 2015-05-30 ENCOUNTER — Ambulatory Visit: Payer: Self-pay | Admitting: Physician Assistant

## 2015-05-30 ENCOUNTER — Encounter: Payer: Self-pay | Admitting: Physician Assistant

## 2015-05-30 VITALS — BP 128/64 | HR 45 | Temp 97.7°F | Ht 69.0 in | Wt 262.8 lb

## 2015-05-30 DIAGNOSIS — M25561 Pain in right knee: Secondary | ICD-10-CM

## 2015-05-30 DIAGNOSIS — M25562 Pain in left knee: Secondary | ICD-10-CM

## 2015-05-30 DIAGNOSIS — R001 Bradycardia, unspecified: Secondary | ICD-10-CM

## 2015-05-30 DIAGNOSIS — I10 Essential (primary) hypertension: Secondary | ICD-10-CM

## 2015-05-30 NOTE — Progress Notes (Signed)
BP 128/64 mmHg  Pulse 45  Temp(Src) 97.7 F (36.5 C)  Ht 5\' 9"  (1.753 m)  Wt 262 lb 12.8 oz (119.205 kg)  BMI 38.79 kg/m2  SpO2 97%   Subjective:    Patient ID: Joseph Hogan, male    DOB: 1960-07-13, 55 y.o.   MRN: [id number]  HPI: Joseph Hogan is a 55 y.o. male presenting on 05/30/2015 for Hypertension   HPI   just started taking celery powder and olive leaf extract from [facility] this week  Pt states completely cough is gone.  Denies dizziness, not light-headed.  Pt c/o both knees hurting  Relevant past medical, surgical, family and social history reviewed and updated as indicated. Interim medical history since our last visit reviewed. Allergies and medications reviewed and updated.   Current outpatient prescriptions:  .  Ascorbic Acid (VITAMIN C PO), Take 1 tablet by mouth daily., Disp: , Rfl:  .  B Complex Vitamins (VITAMIN B COMPLEX PO), Take 1 tablet by mouth daily., Disp: , Rfl:  .  Cholecalciferol (VITAMIN D PO), Take 1 tablet by mouth daily., Disp: , Rfl:  .  GARLIC PO, Take 1 tablet by mouth daily., Disp: , Rfl:  .  metoprolol (LOPRESSOR) 50 MG tablet, Take 1 tablet (50 mg total) by mouth 2 (two) times daily., Disp: 60 tablet, Rfl: 1 .  Multiple Vitamin (MULTI VITAMIN PO), Take by mouth daily., Disp: , Rfl:  .  mupirocin ointment (BACTROBAN) 2 %, Place 1 application into the nose 2 (two) times daily., Disp: 22 g, Rfl: 0 .  vitamin E 200 UNIT capsule, Take 200 Units by mouth daily., Disp: , Rfl:    Review of Systems  Constitutional: Negative for fever, chills, diaphoresis, appetite change, fatigue and unexpected weight change.  HENT: Negative for congestion, dental problem, drooling, ear pain, facial swelling, hearing loss, mouth sores, sneezing, sore throat, trouble swallowing and voice change.   Eyes: Negative for pain, discharge, redness, itching and visual disturbance.  Respiratory: Negative for cough, choking, shortness of breath and wheezing.    Cardiovascular: Negative for chest pain, palpitations and leg swelling.  Gastrointestinal: Negative for vomiting, abdominal pain, diarrhea, constipation and blood in stool.  Endocrine: Negative for cold intolerance, heat intolerance and polydipsia.  Genitourinary: Negative for dysuria, hematuria and decreased urine volume.  Musculoskeletal: Positive for arthralgias and gait problem. Negative for back pain.  Skin: Negative for rash.  Allergic/Immunologic: Negative for environmental allergies.  Neurological: Negative for seizures, syncope, light-headedness and headaches.  Hematological: Negative for adenopathy.  Psychiatric/Behavioral: Negative for suicidal ideas, dysphoric mood and agitation. The patient is not nervous/anxious.     Per HPI unless specifically indicated above     Objective:    BP 128/64 mmHg  Pulse 45  Temp(Src) 97.7 F (36.5 C)  Ht 5\' 9"  (1.753 m)  Wt 262 lb 12.8 oz (119.205 kg)  BMI 38.79 kg/m2  SpO2 97%  Wt Readings from Last 3 Encounters:  05/30/15 262 lb 12.8 oz (119.205 kg)  04/27/15 266 lb 9.6 oz (120.929 kg)  04/11/15 264 lb 6.4 oz (119.931 kg)    Physical Exam  Constitutional: He is oriented to person, place, and time. He appears well-developed and well-nourished.  HENT:  Head: Normocephalic and atraumatic.  Neck: Neck supple.  Cardiovascular: Normal rate and regular rhythm.   Pulmonary/Chest: Effort normal and breath sounds normal. He has no wheezes.  Abdominal: Soft. Bowel sounds are normal. There is no hepatosplenomegaly. There is no tenderness.  Musculoskeletal: He exhibits no edema.       Right knee: He exhibits normal range of motion, no swelling, no effusion and no LCL laxity. Tenderness found.       Left knee: He exhibits normal range of motion, no swelling, no effusion and no LCL laxity. Tenderness found.  Lymphadenopathy:    He has no cervical adenopathy.  Neurological: He is alert and oriented to person, place, and time.  Skin: Skin is  warm and dry.  Psychiatric: He has a normal mood and affect. His behavior is normal.  Vitals reviewed.       Assessment & Plan:   Encounter Diagnoses  Name Primary?  . Essential hypertension, benign Yes  . Bradycardia   . Arthralgia of both knees     F/u 1 month recheck pulse/bp, knees.  Order xrays, gave [employer] discount application

## 2015-06-01 ENCOUNTER — Ambulatory Visit (HOSPITAL_COMMUNITY)
Admission: RE | Admit: 2015-06-01 | Discharge: 2015-06-01 | Disposition: A | Discharge status: Home / Self Care | Payer: BC Managed Care – PPO | Source: Ambulatory Visit | Attending: Physician Assistant | Admitting: Physician Assistant

## 2015-06-01 DIAGNOSIS — M25561 Pain in right knee: Secondary | ICD-10-CM | POA: Insufficient documentation

## 2015-06-01 DIAGNOSIS — M1711 Unilateral primary osteoarthritis, right knee: Secondary | ICD-10-CM | POA: Insufficient documentation

## 2015-06-01 DIAGNOSIS — M25562 Pain in left knee: Secondary | ICD-10-CM | POA: Insufficient documentation

## 2015-06-04 ENCOUNTER — Other Ambulatory Visit: Payer: Self-pay | Admitting: Physician Assistant

## 2015-06-04 DIAGNOSIS — M170 Bilateral primary osteoarthritis of knee: Secondary | ICD-10-CM

## 2015-06-04 DIAGNOSIS — M25561 Pain in right knee: Secondary | ICD-10-CM

## 2015-06-04 DIAGNOSIS — M25562 Pain in left knee: Principal | ICD-10-CM

## 2015-06-05 ENCOUNTER — Telehealth: Payer: Self-pay | Admitting: Student

## 2015-06-05 NOTE — Telephone Encounter (Signed)
-----   Message from Soyla Dryer, [place] sent at 06/04/2015 12:19 PM EDT ----- Please let pt know that xray shows significant arthritis both knees.  He needs to let us know when he gets approved for [employer] discount so we can get him appointment with orthopedics. Referral ordered.

## 2015-06-05 NOTE — Telephone Encounter (Signed)
Pt called and was requesting appt for his knee pain. Pt was informed of xray results. Pt was informed that PA ordered a referral to orthopedics and he should turn in [employer] discount application ASAP. Pt understood. Pt requested something for pain. PA advised he take OTC aleve or ibuprofen. Pt understood. Pt stated he would turn application in tomorrow (06-06-15)

## 2015-06-16 ENCOUNTER — Other Ambulatory Visit: Payer: Self-pay | Admitting: Physician Assistant

## 2015-06-16 ENCOUNTER — Telehealth: Payer: Self-pay | Admitting: Student

## 2015-06-16 MED ORDER — DICLOFENAC SODIUM 75 MG PO TBEC: 75.0000 mg | DELAYED_RELEASE_TABLET | Freq: Two times a day (BID) | ORAL | Status: DC | PRN

## 2015-06-16 NOTE — Telephone Encounter (Signed)
-----   Message from Soyla Dryer, [place] sent at 06/15/2015  4:28 PM EDT ----- We can call in some diclofenac for his pain until he can get in with orthopedics.  He is to avoid ibuprofen and aleve while taking this medication.  He is okay to take tylenol while on it however.  I will send rx when you let me know what pharmacy. thanks

## 2015-06-16 NOTE — Telephone Encounter (Signed)
Pt called and left voicemail on office's answering machine c/o severe pain from arthritis, and only being able to walk for parts of the day.   Pt stated he had tried taking ibuprofen and aleve as was recommended on 06-05-15 by PA, but neither hav been effective. Pt was requesting something else to relive pain.  Pt was called today 06-16-15 and was informed that PA would call him in some diclofenac for pain until he could get scheduled with orthopedics. Pt was advised to avoid taking ibuprofen and aleve while taking this med, but could however take with tylenol. Pt requested Rx to be sent to [facility]. Pt was advised to call the pharmacy to see if Rx had been sent considering that today is Friday (office is closed on Fridays) and it may not get done until the office reopens on Monday. Pt understood.

## 2015-06-29 ENCOUNTER — Encounter: Payer: Self-pay | Admitting: Physician Assistant

## 2015-06-29 ENCOUNTER — Ambulatory Visit: Payer: Self-pay | Admitting: Physician Assistant

## 2015-06-29 VITALS — BP 142/86 | HR 55 | Temp 97.9°F | Ht 69.0 in | Wt 267.2 lb

## 2015-06-29 DIAGNOSIS — M25562 Pain in left knee: Principal | ICD-10-CM

## 2015-06-29 DIAGNOSIS — I10 Essential (primary) hypertension: Secondary | ICD-10-CM

## 2015-06-29 DIAGNOSIS — M170 Bilateral primary osteoarthritis of knee: Secondary | ICD-10-CM

## 2015-06-29 DIAGNOSIS — M1990 Unspecified osteoarthritis, unspecified site: Secondary | ICD-10-CM | POA: Insufficient documentation

## 2015-06-29 DIAGNOSIS — M25561 Pain in right knee: Secondary | ICD-10-CM | POA: Insufficient documentation

## 2015-06-29 NOTE — Progress Notes (Signed)
BP 142/86 mmHg  Pulse 55  Temp(Src) 97.9 F (36.6 C)  Ht 5\' 9"  (1.753 m)  Wt 267 lb 3.2 oz (121.201 kg)  BMI 39.44 kg/m2  SpO2 94%   Subjective:    Patient ID: XAIDEN LEARN, male    DOB: Feb 04, 1961, 55 y.o.   MRN: [id number]  HPI: JAVARI NORDINE is a 55 y.o. male presenting on 57/18/2017 for Hypertension and Joint Swelling   HPI   Pt says he turned in his [employer] discount application in April  Pt states the diclofenac is working great for his knee pain  Pt says he is feeling great  Relevant past medical, surgical, family and social history reviewed and updated as indicated. Interim medical history since our last visit reviewed. Allergies and medications reviewed and updated.   Current outpatient prescriptions:  .  Ascorbic Acid (VITAMIN C PO), Take 1 tablet by mouth daily., Disp: , Rfl:  .  B Complex Vitamins (VITAMIN B COMPLEX PO), Take 1 tablet by mouth daily., Disp: , Rfl:  .  Cholecalciferol (VITAMIN D PO), Take 1 tablet by mouth daily., Disp: , Rfl:  .  diclofenac (VOLTAREN) 75 MG EC tablet, Take 1 tablet (75 mg total) by mouth every 12 (twelve) hours as needed (pain)., Disp: 30 tablet, Rfl: 1 .  GARLIC PO, Take 1 tablet by mouth daily., Disp: , Rfl:  .  metoprolol (LOPRESSOR) 50 MG tablet, Take 1 tablet (50 mg total) by mouth 2 (two) times daily., Disp: 60 tablet, Rfl: 1 .  Multiple Vitamin (MULTI VITAMIN PO), Take by mouth daily., Disp: , Rfl:  .  mupirocin ointment (BACTROBAN) 2 %, Place 1 application into the nose 2 (two) times daily., Disp: 22 g, Rfl: 0 .  vitamin E 200 UNIT capsule, Take 200 Units by mouth daily., Disp: , Rfl:    Review of Systems  Constitutional: Negative for fever, chills, diaphoresis, appetite change, fatigue and unexpected weight change.  HENT: Positive for dental problem. Negative for congestion, drooling, ear pain, facial swelling, hearing loss, mouth sores, sneezing, sore throat, trouble swallowing and voice change.   Eyes: Negative for  pain, discharge, redness, itching and visual disturbance.  Respiratory: Negative for cough, choking, shortness of breath and wheezing.   Cardiovascular: Negative for chest pain, palpitations and leg swelling.  Gastrointestinal: Negative for vomiting, abdominal pain, diarrhea, constipation and blood in stool.  Endocrine: Negative for cold intolerance, heat intolerance and polydipsia.  Genitourinary: Negative for dysuria, hematuria and decreased urine volume.  Musculoskeletal: Negative for back pain, arthralgias and gait problem.  Skin: Negative for rash.  Allergic/Immunologic: Negative for environmental allergies.  Neurological: Negative for seizures, syncope, light-headedness and headaches.  Hematological: Negative for adenopathy.  Psychiatric/Behavioral: Negative for suicidal ideas, dysphoric mood and agitation. The patient is not nervous/anxious.     Per HPI unless specifically indicated above     Objective:    BP 142/86 mmHg  Pulse 55  Temp(Src) 97.9 F (36.6 C)  Ht 5\' 9"  (1.753 m)  Wt 267 lb 3.2 oz (121.201 kg)  BMI 39.44 kg/m2  SpO2 94%  Wt Readings from Last 3 Encounters:  06/29/15 267 lb 3.2 oz (121.201 kg)  05/30/15 262 lb 12.8 oz (119.205 kg)  04/27/15 266 lb 9.6 oz (120.929 kg)    Physical Exam  Constitutional: He is oriented to person, place, and time. He appears well-developed and well-nourished.  HENT:  Head: Normocephalic and atraumatic.  Neck: Neck supple.  Cardiovascular: Normal rate and regular rhythm.  Pulmonary/Chest: Effort normal and breath sounds normal. He has no wheezes.  Abdominal: Soft. Bowel sounds are normal. There is no hepatosplenomegaly. There is no tenderness.  Musculoskeletal: He exhibits no edema.  Lymphadenopathy:    He has no cervical adenopathy.  Neurological: He is alert and oriented to person, place, and time.  Skin: Skin is warm and dry.  Psychiatric: He has a normal mood and affect. His behavior is normal.  Vitals  reviewed.       Assessment & Plan:    Encounter Diagnoses  Name Primary?  . Bilateral knee pain Yes  . Osteoarthritis of both knees, unspecified osteoarthritis type   . Essential hypertension, benign     -Pt awaiting appointment with orthopedics for his knees.  He will continue to use diclofenac prn.  Discussed risks of diclofenac and encouraged him to take it with food and only if needed -pulse better today but bp is also higher.  No med changes today -f/u 1 month to make sure pt has gotten in to see orthopedist

## 2015-07-11 ENCOUNTER — Ambulatory Visit: Payer: Self-pay | Admitting: Physician Assistant

## 2015-08-03 ENCOUNTER — Encounter: Payer: Self-pay | Admitting: Physician Assistant

## 2015-08-03 ENCOUNTER — Ambulatory Visit: Payer: Self-pay | Admitting: Physician Assistant

## 2015-08-03 VITALS — BP 140/82 | HR 52 | Temp 97.9°F | Ht 69.0 in | Wt 269.0 lb

## 2015-08-03 DIAGNOSIS — M25561 Pain in right knee: Secondary | ICD-10-CM

## 2015-08-03 DIAGNOSIS — M25562 Pain in left knee: Secondary | ICD-10-CM

## 2015-08-03 DIAGNOSIS — I10 Essential (primary) hypertension: Secondary | ICD-10-CM

## 2015-08-03 NOTE — Progress Notes (Signed)
BP 140/82 mmHg  Pulse 52  Temp(Src) 97.9 F (36.6 C)  Ht 5\' 9"  (1.753 m)  Wt 269 lb (122.018 kg)  BMI 39.71 kg/m2  SpO2 96%   Subjective:    Patient ID: Joseph Hogan, male    DOB: 01-Aug-1960, 55 y.o.   MRN: [id number]  HPI: Joseph Hogan is a 55 y.o. male presenting on 08/03/2015 for Hypertension and Knee Pain   HPI   Pt still with knee pain. He hasn't heard anything on his [facility] application yet.  Relevant past medical, surgical, family and social history reviewed and updated as indicated. Interim medical history since our last visit reviewed. Allergies and medications reviewed and updated.   Current outpatient prescriptions:  .  Ascorbic Acid (VITAMIN C PO), Take 1 tablet by mouth daily., Disp: , Rfl:  .  B Complex Vitamins (VITAMIN B COMPLEX PO), Take 1 tablet by mouth daily., Disp: , Rfl:  .  Cholecalciferol (VITAMIN D PO), Take 1 tablet by mouth daily., Disp: , Rfl:  .  diclofenac (VOLTAREN) 75 MG EC tablet, Take 1 tablet (75 mg total) by mouth every 12 (twelve) hours as needed (pain)., Disp: 30 tablet, Rfl: 1 .  GARLIC PO, Take 1 tablet by mouth daily., Disp: , Rfl:  .  metoprolol (LOPRESSOR) 50 MG tablet, Take 1 tablet (50 mg total) by mouth 2 (two) times daily., Disp: 60 tablet, Rfl: 1 .  Multiple Vitamin (MULTI VITAMIN PO), Take by mouth daily., Disp: , Rfl:  .  mupirocin ointment (BACTROBAN) 2 %, Place 1 application into the nose 2 (two) times daily., Disp: 22 g, Rfl: 0 .  vitamin E 200 UNIT capsule, Take 200 Units by mouth daily., Disp: , Rfl:    Review of Systems  Constitutional: Negative for fever, chills, diaphoresis, appetite change, fatigue and unexpected weight change.  HENT: Negative for congestion, dental problem, drooling, ear pain, facial swelling, hearing loss, mouth sores, sneezing, sore throat, trouble swallowing and voice change.   Eyes: Negative for pain, discharge, redness, itching and visual disturbance.  Respiratory: Negative for cough,  choking, shortness of breath and wheezing.   Cardiovascular: Negative for chest pain, palpitations and leg swelling.  Gastrointestinal: Negative for vomiting, abdominal pain, diarrhea, constipation and blood in stool.  Endocrine: Negative for cold intolerance, heat intolerance and polydipsia.  Genitourinary: Negative for dysuria, hematuria and decreased urine volume.  Musculoskeletal: Positive for arthralgias and gait problem. Negative for back pain.  Skin: Negative for rash.  Allergic/Immunologic: Negative for environmental allergies.  Neurological: Negative for seizures, syncope, light-headedness and headaches.  Hematological: Negative for adenopathy.  Psychiatric/Behavioral: Negative for suicidal ideas, dysphoric mood and agitation. The patient is not nervous/anxious.     Per HPI unless specifically indicated above     Objective:    BP 140/82 mmHg  Pulse 52  Temp(Src) 97.9 F (36.6 C)  Ht 5\' 9"  (1.753 m)  Wt 269 lb (122.018 kg)  BMI 39.71 kg/m2  SpO2 96%  Wt Readings from Last 3 Encounters:  08/03/15 269 lb (122.018 kg)  06/29/15 267 lb 3.2 oz (121.201 kg)  05/30/15 262 lb 12.8 oz (119.205 kg)    Physical Exam  Constitutional: He is oriented to person, place, and time. He appears well-developed and well-nourished.  HENT:  Head: Normocephalic and atraumatic.  Neck: Neck supple.  Cardiovascular: Normal rate and regular rhythm.   Pulmonary/Chest: Effort normal and breath sounds normal. He has no wheezes.  Musculoskeletal: He exhibits no edema.  Lymphadenopathy:  He has no cervical adenopathy.  Neurological: He is alert and oriented to person, place, and time.  Skin: Skin is warm and dry.  Psychiatric: He has a normal mood and affect. His behavior is normal.  Vitals reviewed.       Assessment & Plan:   Encounter Diagnoses  Name Primary?  . Essential hypertension, benign Yes  . Bilateral knee pain      Discussed [facility]. Nurse heard from [place] that  they started processing last week. Pt should get letter soon. He is to notify us when he gets it so we can get him in with orthopedics  Monitor bp. No changes today  F/u 1 month.  RTO sooner prn

## 2015-08-24 ENCOUNTER — Encounter: Payer: Self-pay | Admitting: Physician Assistant

## 2015-08-24 ENCOUNTER — Ambulatory Visit: Payer: Self-pay | Admitting: Physician Assistant

## 2015-08-24 VITALS — BP 144/68 | HR 56 | Temp 97.3°F | Ht 69.0 in | Wt 273.2 lb

## 2015-08-24 DIAGNOSIS — M25562 Pain in left knee: Secondary | ICD-10-CM

## 2015-08-24 DIAGNOSIS — M25561 Pain in right knee: Secondary | ICD-10-CM

## 2015-08-24 DIAGNOSIS — I10 Essential (primary) hypertension: Secondary | ICD-10-CM

## 2015-08-24 MED ORDER — MUPIROCIN 2 % EX OINT: 1.0000 "application " | TOPICAL_OINTMENT | Freq: Two times a day (BID) | CUTANEOUS | Status: DC

## 2015-08-24 NOTE — Progress Notes (Signed)
BP 144/68 mmHg  Pulse 56  Temp(Src) 97.3 F (36.3 C)  Ht 5\' 9"  (1.753 m)  Wt 273 lb 3.2 oz (123.923 kg)  BMI 40.33 kg/m2  SpO2 94%   Subjective:    Patient ID: Joseph Hogan, male    DOB: 03/11/1960, 55 y.o.   MRN: [id number]  HPI: Joseph Hogan is a 55 y.o. male presenting on 08/24/2015 for Hypertension and Knee Pain   HPI   Pt hasn't heard from ortho yet.  He has contacted them several times but hasn't gotten an appt yet.  Pt turned in his [facility] discount application several months ago- May he thinks.  He is doing well except for his knees.  Relevant past medical, surgical, family and social history reviewed and updated as indicated. Interim medical history since our last visit reviewed. Allergies and medications reviewed and updated.  Current outpatient prescriptions:  .  Ascorbic Acid (VITAMIN C PO), Take 1 tablet by mouth daily., Disp: , Rfl:  .  B Complex Vitamins (VITAMIN B COMPLEX PO), Take 1 tablet by mouth daily., Disp: , Rfl:  .  Cholecalciferol (VITAMIN D PO), Take 1 tablet by mouth daily., Disp: , Rfl:  .  diclofenac (VOLTAREN) 75 MG EC tablet, Take 1 tablet (75 mg total) by mouth every 12 (twelve) hours as needed (pain)., Disp: 30 tablet, Rfl: 1 .  GARLIC PO, Take 1 tablet by mouth daily., Disp: , Rfl:  .  metoprolol (LOPRESSOR) 50 MG tablet, Take 1 tablet (50 mg total) by mouth 2 (two) times daily., Disp: 60 tablet, Rfl: 1 .  Multiple Vitamin (MULTI VITAMIN PO), Take by mouth daily., Disp: , Rfl:  .  mupirocin ointment (BACTROBAN) 2 %, Place 1 application into the nose 2 (two) times daily., Disp: 22 g, Rfl: 0 .  vitamin E 200 UNIT capsule, Take 200 Units by mouth daily., Disp: , Rfl:    Review of Systems  Constitutional: Negative for fever, chills, diaphoresis, appetite change, fatigue and unexpected weight change.  HENT: Negative for congestion, dental problem, drooling, ear pain, facial swelling, hearing loss, mouth sores, sneezing, sore throat, trouble  swallowing and voice change.   Eyes: Negative for pain, discharge, redness, itching and visual disturbance.  Respiratory: Negative for cough, choking, shortness of breath and wheezing.   Cardiovascular: Negative for chest pain, palpitations and leg swelling.  Gastrointestinal: Negative for vomiting, abdominal pain, diarrhea, constipation and blood in stool.  Endocrine: Negative for cold intolerance, heat intolerance and polydipsia.  Genitourinary: Negative for dysuria, hematuria and decreased urine volume.  Musculoskeletal: Positive for arthralgias and gait problem. Negative for back pain.  Skin: Negative for rash.  Allergic/Immunologic: Negative for environmental allergies.  Neurological: Negative for seizures, syncope, light-headedness and headaches.  Hematological: Negative for adenopathy.  Psychiatric/Behavioral: Negative for suicidal ideas, dysphoric mood and agitation. The patient is not nervous/anxious.     Per HPI unless specifically indicated above     Objective:    BP 144/68 mmHg  Pulse 56  Temp(Src) 97.3 F (36.3 C)  Ht 5\' 9"  (1.753 m)  Wt 273 lb 3.2 oz (123.923 kg)  BMI 40.33 kg/m2  SpO2 94%  Wt Readings from Last 3 Encounters:  08/24/15 273 lb 3.2 oz (123.923 kg)  08/03/15 269 lb (122.018 kg)  06/29/15 267 lb 3.2 oz (121.201 kg)    Physical Exam  Constitutional: He is oriented to person, place, and time. He appears well-developed and well-nourished.  HENT:  Head: Normocephalic and atraumatic.  Neck: Neck  supple.  Cardiovascular: Normal rate and regular rhythm.   Pulmonary/Chest: Effort normal and breath sounds normal. He has no wheezes.  Abdominal: Soft. Bowel sounds are normal. There is no hepatosplenomegaly. There is no tenderness.  Musculoskeletal: He exhibits no edema.  Lymphadenopathy:    He has no cervical adenopathy.  Neurological: He is alert and oriented to person, place, and time.  Skin: Skin is warm and dry.  Psychiatric: He has a normal mood and  affect. His behavior is normal.  Vitals reviewed.       Assessment & Plan:   Encounter Diagnoses  Name Primary?  . Essential hypertension, benign Yes  . Bilateral knee pain      -We will call ortho office. Nurse called and got pt appointment scheduled -No change in bp med- needs knee fixed- improvement in pain likely improve bp -F/u 6 wk. RTO sooner prn

## 2015-08-31 ENCOUNTER — Ambulatory Visit: Payer: Self-pay | Admitting: Physician Assistant

## 2015-09-11 ENCOUNTER — Ambulatory Visit: Payer: Self-pay | Admitting: Orthopedic Surgery

## 2015-09-20 ENCOUNTER — Encounter: Payer: Self-pay | Admitting: Orthopedic Surgery

## 2015-09-20 ENCOUNTER — Ambulatory Visit (INDEPENDENT_AMBULATORY_CARE_PROVIDER_SITE_OTHER): Payer: Self-pay | Admitting: Orthopedic Surgery

## 2015-09-20 VITALS — BP 133/75 | HR 56 | Ht 70.0 in | Wt 272.6 lb

## 2015-09-20 DIAGNOSIS — M170 Bilateral primary osteoarthritis of knee: Secondary | ICD-10-CM

## 2015-09-20 NOTE — Patient Instructions (Addendum)
Brace   Take medication every day x 1 month    Total Knee Replacement Total knee replacement is a procedure to replace your knee joint with an artificial knee joint (prosthetic knee joint). The purpose of this surgery is to reduce pain and improve your knee function. LET [facility] CARE PROVIDER KNOW ABOUT:   Any allergies you have.  All medicines you are taking, including vitamins, herbs, eye drops, creams, and over-the-counter medicines.  Previous problems you or members of your family have had with the use of anesthetics.  Any blood disorders you have.  Previous surgeries you have had.  Medical conditions you have. RISKS AND COMPLICATIONS  Generally, total knee replacement is a safe procedure. However, problems can occur, including:  Loss of range of motion of the knee or instability of the knee.  Loosening of the prosthesis.  Infection.  Persistent pain. BEFORE THE PROCEDURE   Plan to have someone take you home after the procedure.  Do not eat or drink anything after midnight on the night before the procedure or as directed by your health care provider.  Ask your health care provider about:  Changing or stopping your regular medicines. This is especially important if you are taking diabetes medicines or blood thinners.  Taking medicines such as aspirin and ibuprofen. These medicines can thin your blood. Do not take these medicines before your procedure if your health care provider asks you not to.  Ask your health care provider about how your surgical site will be marked or identified.  You may be given antibiotic medicines to help prevent infection. PROCEDURE   To reduce your risk of infection:  Your health care team will wash or sanitize their hands.  Your skin will be washed with soap.  An IV tube will be inserted into one of your veins. You will be given one or more of the following:  A medicine that makes you drowsy (sedative).  A medicine that makes  you fall asleep (general anesthetic).  A medicine injected into your spine that numbs your body below the waist (spinal anesthetic).  A medicine to block feeling in your leg (nerve block) to help ease pain after surgery.  An incision will be made in your knee. Your surgeon will take out any damaged cartilage and bone by sawing off the damaged surfaces.  The surgeon will then put a new metal liner over the sawed-off portion of your thigh bone (femur) and a plastic liner over the sawed-off portion of one of the bones of your lower leg (tibia). This is to restore alignment and function to your knee. A plastic piece is often used to restore the surface of your knee cap. AFTER THE PROCEDURE   You will stay in a recovery area until your medicines wear off.  You may have drainage tubes to drain excess fluid from your knee. These tubes attach to a device that removes these fluids.  Once you are awake, stable, and taking fluids well, you will be taken to your hospital room.  You may be directed to take actions to help prevent blood clots. These may include:  Walking shortly after surgery, with someone assisting you. Moving around after surgery helps to improve blood flow.  Wearing compression stockings or using different types of devices.  You will receive physical therapy as prescribed by your health care provider.   This information is not intended to replace advice given to you by your health care provider. Make sure you discuss any  questions you have with your health care provider.   Document Released: 05/06/2000 Document Revised: 10/19/2014 Document Reviewed: 03/10/2011 Elsevier Interactive Patient Education [employer].

## 2015-09-20 NOTE — Progress Notes (Signed)
Chief Complaint  Patient presents with  . 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 He is currently on diclofenac 75 mg twice a day which she takes when he thinks he's not have a bad day. He has not had any other treatment such as injection therapy surgery or weight loss.  He complains of constant pain with increased burning and aching on the medial side of the right and left knee with occasional locking catching and giving way on the left side. He is artery had his x-rays today show grade 4 arthritis of the medial compartment with compensatory bone changes of cyst formation sclerosis and malalignment with varus alignment noted.  Review of Systems  Musculoskeletal: Positive for joint pain.  Neurological: Positive for tingling.  All other systems reviewed and are negative.   Past Medical History:  Diagnosis Date  . Hypertension   . MRSA (methicillin resistant Staphylococcus aureus)     History reviewed. No pertinent surgical history.  No history of surgery reported by the patient   Family History  Problem Relation Age of Onset  . Stroke Mother   . Hypertension Mother   . Heart disease Father   . Alzheimer's disease Father   . Hypertension Brother   . Alcohol abuse Brother   . Cancer Maternal Aunt   . Heart disease Maternal Aunt   . Cancer Maternal Uncle   . Heart disease Maternal Uncle   . Cancer Paternal Aunt   . Heart disease Paternal Aunt   . Cancer Paternal Uncle   . Heart disease Paternal Uncle   . Heart disease Paternal Grandmother   . Alzheimer's disease Paternal Grandfather    Social History  Substance Use Topics  . Smoking status: Never Smoker  . Smokeless tobacco: Never Used   . Alcohol use No    Current Outpatient Prescriptions:  .  Ascorbic Acid (VITAMIN C PO), Take 1 tablet by mouth daily., Disp: , Rfl:  .  B Complex Vitamins (VITAMIN B COMPLEX PO), Take 1 tablet by mouth daily., Disp: , Rfl:  .  Cholecalciferol (VITAMIN D PO), Take 1 tablet by mouth daily., Disp: , Rfl:  .  diclofenac (VOLTAREN) 75 MG EC tablet, Take 1 tablet (75 mg total) by mouth every 12 (twelve) hours as needed (pain)., Disp: 30 tablet, Rfl: 1 .  GARLIC PO, Take 1 tablet by mouth daily., Disp: , Rfl:  .  metoprolol (LOPRESSOR) 50 MG tablet, Take 1 tablet (50 mg total) by mouth 2 (two) times daily., Disp: 60 tablet, Rfl: 1 .  Multiple Vitamin (MULTI VITAMIN PO), Take by mouth daily., Disp: , Rfl:  .  mupirocin ointment (BACTROBAN) 2 %, Place 1 application into the nose 2 (two) times daily., Disp: 22 g, Rfl: 0 .  vitamin E 200 UNIT capsule, Take 200 Units by mouth daily., Disp: , Rfl:   BP 133/75   Pulse (!) 56   Ht 5\' 10"  (1.778 m)   Wt 272 lb 9.6 oz (123.7 kg)   BMI 39.11 kg/m   Physical Exam  Constitutional: He is oriented to person, place, and time. He appears well-developed and well-nourished. No distress.  Cardiovascular: Normal rate and  intact distal pulses.   Neurological: He is alert and oriented to person, place, and time.  Skin: Skin is warm and dry. No rash noted. He is not diaphoretic. No erythema. No pallor.  Psychiatric: He has a normal mood and affect. His behavior is normal. Judgment and thought content normal.    Ortho Exam Left knee medial joint line tenderness knee flexion measured 110 with a goniometer. Is 5 flexion contracture. All ligaments were stable. Muscle tone and strength are normal. There are no skin lesions. Had mild peripheral edema and normal sensation in the left leg  Right knee had a 10 flexion contracture and 112 12 range of motion by goniometer with no evidence of ligament instability. Muscle tone and strength was normal no atrophy. There  were no skin lesions. There is mild peripheral edema as well on this side and sensation in the leg was normal  ASSESSMENT: My personal interpretation of the images:  Right knee shows a varus knee with bone-on-bone changes of the medial side peripheral osteophytes are noted medial and lateral tibia and femur with subchondral sclerosis and cyst formation  We see a similar picture on the left side  Diagnosis osteoarthritis bilateral knees   PLAN  Advices to try taking the medicine daily Brace left knee for the instability Try weight loss Return 1 month if no improvement or options at that point are cortisone injection or knee replacement    Arther Abbott, MD 09/20/2015 3:05 PM

## 2015-09-30 ENCOUNTER — Other Ambulatory Visit: Payer: Self-pay | Admitting: Physician Assistant

## 2015-09-30 MED ORDER — DICLOFENAC SODIUM 75 MG PO TBEC: 75.0000 mg | DELAYED_RELEASE_TABLET | Freq: Two times a day (BID) | ORAL | 1 refills | Status: DC | PRN

## 2015-10-18 ENCOUNTER — Ambulatory Visit: Payer: Self-pay | Admitting: Physician Assistant

## 2015-10-25 ENCOUNTER — Ambulatory Visit: Payer: Self-pay | Admitting: Orthopedic Surgery

## 2015-11-01 ENCOUNTER — Encounter: Payer: Self-pay | Admitting: Physician Assistant

## 2015-11-01 ENCOUNTER — Ambulatory Visit: Payer: Self-pay | Admitting: Physician Assistant

## 2015-11-01 VITALS — BP 142/86 | HR 54 | Temp 97.3°F | Ht 69.0 in | Wt 280.0 lb

## 2015-11-01 DIAGNOSIS — I10 Essential (primary) hypertension: Secondary | ICD-10-CM

## 2015-11-01 DIAGNOSIS — M25561 Pain in right knee: Secondary | ICD-10-CM

## 2015-11-01 DIAGNOSIS — M25562 Pain in left knee: Secondary | ICD-10-CM

## 2015-11-01 MED ORDER — HYDROCHLOROTHIAZIDE 12.5 MG PO CAPS: 12.5000 mg | ORAL_CAPSULE | Freq: Every day | ORAL | 3 refills | Status: DC

## 2015-11-01 NOTE — Progress Notes (Signed)
BP (!) 156/86 (BP Location: Left Arm, Patient Position: Sitting, Cuff Size: Large)   Pulse (!) 54   Temp 97.3 F (36.3 C) (Other (Comment))   Ht 5\' 9"  (1.753 m)   Wt 280 lb (127 kg)   SpO2 98%   BMI 41.35 kg/m    Subjective:    Patient ID: Joseph Hogan, male    DOB: 1960-03-22, 55 y.o.   MRN: [id number]  HPI: BAYLAN RETTERER is a 55 y.o. male presenting on 11/01/2015 for Hypertension and Knee Pain (bilateral)   HPI   Pt went to orthopedist.  He says he is having problems with his [facility].    He says the diclofenac isn't handling his knee pain any longer.   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 Says is better in afternoon when knee pain decreases- usually runs 144-ish.  Recheck today 142/86  Relevant past medical, surgical, family and social history reviewed and updated as indicated. Interim medical history since our last visit reviewed. Allergies and medications reviewed and updated.   Current Outpatient Prescriptions:  .  Ascorbic Acid (VITAMIN C PO), Take 1 tablet by mouth daily., Disp: , Rfl:  .  B Complex Vitamins (VITAMIN B COMPLEX PO), Take 1 tablet by mouth daily., Disp: , Rfl:  .  Cholecalciferol (VITAMIN D PO), Take 1 tablet by mouth daily., Disp: , Rfl:  .  diclofenac (VOLTAREN) 75 MG EC tablet, Take 1 tablet (75 mg total) by mouth every 12 (twelve) hours as needed (pain)., Disp: 30 tablet, Rfl: 1 .  GARLIC PO, Take 1 tablet by mouth daily., Disp: , Rfl:  .  metoprolol (LOPRESSOR) 50 MG tablet, Take 1 tablet (50 mg total) by mouth 2 (two) times daily., Disp: 60 tablet, Rfl: 1 .  Multiple Vitamin (MULTI VITAMIN PO), Take by mouth daily., Disp: , Rfl:  .  mupirocin ointment (BACTROBAN) 2 %, Place 1 application into the nose 2 (two) times daily., Disp: 22 g, Rfl: 0 .  vitamin E 200 UNIT capsule, Take 200 Units by mouth daily., Disp: , Rfl:    Review of Systems  Constitutional: Negative for appetite change, chills,  diaphoresis, fatigue, fever and unexpected weight change.  HENT: Negative for congestion, dental problem, drooling, ear pain, facial swelling, hearing loss, mouth sores, sneezing, sore throat, trouble swallowing and voice change.   Eyes: Negative for pain, discharge, redness, itching and visual disturbance.  Respiratory: Negative for cough, choking, shortness of breath and wheezing.   Cardiovascular: Negative for chest pain, palpitations and leg swelling.  Gastrointestinal: Negative for abdominal pain, blood in stool, constipation, diarrhea and vomiting.  Endocrine: Negative for cold intolerance, heat intolerance and polydipsia.  Genitourinary: Negative for decreased urine volume, dysuria and hematuria.  Musculoskeletal: Positive for arthralgias. Negative for back pain and gait problem.  Skin: Negative for rash.  Allergic/Immunologic: Negative for environmental allergies.  Neurological: Negative for seizures, syncope, light-headedness and headaches.  Hematological: Negative for adenopathy.  Psychiatric/Behavioral: Negative for agitation, dysphoric mood and suicidal ideas. The patient is not nervous/anxious.     Per HPI unless specifically indicated above     Objective:    BP (!) 156/86 (BP Location: Left Arm, Patient Position: Sitting, Cuff Size: Large)   Pulse (!) 54   Temp 97.3 F (36.3 C) (Other (Comment))   Ht 5\' 9"  (1.753 m)   Wt 280 lb (127 kg)   SpO2 98%   BMI 41.35 kg/m   Wt  Readings from Last 3 Encounters:  11/01/15 280 lb (127 kg)  09/20/15 272 lb 9.6 oz (123.7 kg)  08/24/15 273 lb 3.2 oz (123.9 kg)    Physical Exam  Constitutional: He is oriented to person, place, and time. He appears well-developed and well-nourished.  HENT:  Head: Normocephalic and atraumatic.  Neck: Neck supple.  Cardiovascular: Normal rate and regular rhythm.   Pulmonary/Chest: Effort normal and breath sounds normal. He has no wheezes.  Abdominal: Soft. Bowel sounds are normal. There is no  hepatosplenomegaly. There is no tenderness.  Musculoskeletal: He exhibits no edema.  Lymphadenopathy:    He has no cervical adenopathy.  Neurological: He is alert and oriented to person, place, and time.  Skin: Skin is warm and dry.  Psychiatric: He has a normal mood and affect. His behavior is normal.  Vitals reviewed.       Assessment & Plan:   Encounter Diagnoses  Name Primary?  . Essential hypertension, benign Yes  . Bilateral knee pain   . Morbid obesity, unspecified obesity type ([place])     -counseled pt on checking on [facility] -Add hctz 12.5mg  daily for BP -Check bmp- today -F/u 1 month recheck bp

## 2015-11-29 ENCOUNTER — Ambulatory Visit: Payer: Self-pay | Admitting: Physician Assistant

## 2015-11-30 ENCOUNTER — Other Ambulatory Visit: Payer: Self-pay | Admitting: Physician Assistant

## 2015-12-07 ENCOUNTER — Encounter: Payer: Self-pay | Admitting: Physician Assistant

## 2015-12-12 LAB — BASIC METABOLIC PANEL
BUN: 16 mg/dL (ref 7–25)
CHLORIDE: 102 mmol/L (ref 98–110)
CO2: 26 mmol/L (ref 20–31)
Calcium: 9 mg/dL (ref 8.6–10.3)
Creat: 0.78 mg/dL (ref 0.70–1.33)
Glucose, Bld: 101 mg/dL — ABNORMAL HIGH (ref 65–99)
POTASSIUM: 4.5 mmol/L (ref 3.5–5.3)
Sodium: 137 mmol/L (ref 135–146)

## 2016-01-01 ENCOUNTER — Other Ambulatory Visit: Payer: Self-pay | Admitting: Physician Assistant

## 2016-01-05 ENCOUNTER — Other Ambulatory Visit: Payer: Self-pay | Admitting: Physician Assistant

## 2016-01-10 ENCOUNTER — Ambulatory Visit: Payer: Self-pay | Admitting: Physician Assistant

## 2016-01-10 ENCOUNTER — Encounter: Payer: Self-pay | Admitting: Physician Assistant

## 2016-01-10 VITALS — BP 144/86 | HR 47 | Temp 97.9°F | Ht 69.0 in | Wt 279.0 lb

## 2016-01-10 DIAGNOSIS — I10 Essential (primary) hypertension: Secondary | ICD-10-CM

## 2016-01-10 DIAGNOSIS — M170 Bilateral primary osteoarthritis of knee: Secondary | ICD-10-CM

## 2016-01-10 MED ORDER — METOPROLOL TARTRATE 50 MG PO TABS: 50.0000 mg | ORAL_TABLET | Freq: Two times a day (BID) | ORAL | 2 refills | Status: DC

## 2016-01-10 MED ORDER — AMLODIPINE BESYLATE 5 MG PO TABS: 5.0000 mg | ORAL_TABLET | Freq: Every day | ORAL | 1 refills | Status: DC

## 2016-01-10 NOTE — Progress Notes (Signed)
BP (!) 144/86 (BP Location: Left Arm, Patient Position: Sitting, Cuff Size: Large)   Pulse (!) 47   Temp 97.9 F (36.6 C)   Ht 5\' 9"  (1.753 m)   Wt 279 lb (126.6 kg)   SpO2 97%   BMI 41.20 kg/m    Subjective:    Patient ID: Joseph Hogan, male    DOB: [birth year], 55 y.o.   MRN: [id number]  HPI: Joseph Hogan is a 55 y.o. male presenting on 01/10/2016 for Follow-up (pt stopped taking HCTZ due to it making him feel nauseous. pt self d/c 2 weeks ago)   HPI   Pt in today for recheck bp.  Stays 128-130 when not in pain.  He has "papers"   He has pain in knees and L thumbs.  Throbs and can't move it.   Diclofenac helps a lot   Pt turned in [employer] application in september- pt says he has turned in 3 times.    Relevant past medical, surgical, family and social history reviewed and updated as indicated. Interim medical history since our last visit reviewed. Allergies and medications reviewed and updated.   Current Outpatient Prescriptions:  .  Ascorbic Acid (VITAMIN C PO), Take 1 tablet by mouth daily., Disp: , Rfl:  .  B Complex Vitamins (VITAMIN B COMPLEX PO), Take 1 tablet by mouth daily., Disp: , Rfl:  .  Cholecalciferol (VITAMIN D PO), Take 1 tablet by mouth daily., Disp: , Rfl:  .  diclofenac (VOLTAREN) 75 MG EC tablet, Take 1 tablet (75 mg total) by mouth every 12 (twelve) hours as needed (pain)., Disp: 30 tablet, Rfl: 1 .  GARLIC PO, Take 1 tablet by mouth daily., Disp: , Rfl:  .  metoprolol (LOPRESSOR) 50 MG tablet, TAKE ONE TABLET BY MOUTH TWICE DAILY, Disp: 60 tablet, Rfl: 0 .  Multiple Vitamin (MULTI VITAMIN PO), Take by mouth daily., Disp: , Rfl:  .  mupirocin ointment (BACTROBAN) 2 %, Place 1 application into the nose 2 (two) times daily., Disp: 22 g, Rfl: 0 .  vitamin E 200 UNIT capsule, Take 200 Units by mouth daily., Disp: , Rfl:  .  hydrochlorothiazide (MICROZIDE) 12.5 MG capsule, Take 1 capsule (12.5 mg total) by mouth daily. (Patient not taking: Reported  on 01/10/2016), Disp: 30 capsule, Rfl: 3   Review of Systems  Constitutional: Negative for appetite change, chills, diaphoresis, fatigue, fever and unexpected weight change.  HENT: Negative for congestion, dental problem, drooling, ear pain, facial swelling, hearing loss, mouth sores, sneezing, sore throat, trouble swallowing and voice change.   Eyes: Negative for pain, discharge, redness, itching and visual disturbance.  Respiratory: Negative for cough, choking, shortness of breath and wheezing.   Cardiovascular: Negative for chest pain, palpitations and leg swelling.  Gastrointestinal: Negative for abdominal pain, blood in stool, constipation, diarrhea and vomiting.  Endocrine: Negative for cold intolerance, heat intolerance and polydipsia.  Genitourinary: Negative for decreased urine volume, dysuria and hematuria.  Musculoskeletal: Positive for arthralgias and gait problem. Negative for back pain.  Skin: Negative for rash.  Allergic/Immunologic: Negative for environmental allergies.  Neurological: Negative for seizures, syncope, light-headedness and headaches.  Hematological: Negative for adenopathy.  Psychiatric/Behavioral: Negative for agitation, dysphoric mood and suicidal ideas. The patient is not nervous/anxious.     Per HPI unless specifically indicated above     Objective:    BP (!) 144/86 (BP Location: Left Arm, Patient Position: Sitting, Cuff Size: Large)   Pulse (!) 47   Temp 97.9  F (36.6 C)   Ht 5\' 9"  (1.753 m)   Wt 279 lb (126.6 kg)   SpO2 97%   BMI 41.20 kg/m   Wt Readings from Last 3 Encounters:  01/10/16 279 lb (126.6 kg)  11/01/15 280 lb (127 kg)  09/20/15 272 lb 9.6 oz (123.7 kg)    Physical Exam  Constitutional: He is oriented to person, place, and time. He appears well-developed and well-nourished.  HENT:  Head: Normocephalic and atraumatic.  Neck: Neck supple.  Cardiovascular: Normal rate and regular rhythm.   Pulmonary/Chest: Effort normal and  breath sounds normal. He has no wheezes.  Abdominal: Soft. Bowel sounds are normal. There is no hepatosplenomegaly. There is no tenderness.  Musculoskeletal: He exhibits no edema.       Right knee: He exhibits no swelling.       Left knee: He exhibits no swelling.  Lymphadenopathy:    He has no cervical adenopathy.  Neurological: He is alert and oriented to person, place, and time.  Skin: Skin is warm and dry.  Psychiatric: He has a normal mood and affect. His behavior is normal.  Vitals reviewed.     Results for orders placed or performed in visit on [id number]  Basic Metabolic Panel (BMET)  Result Value Ref Range   Sodium 137 135 - 146 mmol/L   Potassium 4.5 3.5 - 5.3 mmol/L   Chloride 102 98 - 110 mmol/L   CO2 26 20 - 31 mmol/L   Glucose, Bld 101 (H) 65 - 99 mg/dL   BUN 16 7 - 25 mg/dL   Creat 0.78 0.70 - 1.33 mg/dL   Calcium 9.0 8.6 - 10.3 mg/dL      Assessment & Plan:   Encounter Diagnoses  Name Primary?  . Essential hypertension, benign Yes  . Osteoarthritis of both knees, unspecified osteoarthritis type   . Morbid obesity, unspecified obesity type ([place])      -add amlodipine for bp -reviewed labs with pt -Explained knee xrays/terminology to pt -will check on [employer] application since he needs to return to orthopedics for definitive treatment of his knees -he is working on weight loss to help in the even that he gets knees replaced -follow up one month to recheck blood pressure   (The duration of this appointment visit was 25 minutes of face-to-face time with the patient.  Greater than 50% of this time was spent in counseling, explanation of diagnosis, planning of further management, and coordination of care.)

## 2016-01-23 ENCOUNTER — Telehealth: Payer: Self-pay

## 2016-01-23 NOTE — Telephone Encounter (Signed)
Returned call to Client who had called inquiring about dental services. Client did not answer so message left.

## 2016-02-09 ENCOUNTER — Encounter (HOSPITAL_COMMUNITY): Payer: Self-pay | Admitting: Emergency Medicine

## 2016-02-09 ENCOUNTER — Emergency Department (HOSPITAL_COMMUNITY): Payer: Self-pay

## 2016-02-09 ENCOUNTER — Emergency Department (HOSPITAL_COMMUNITY)
Admission: EM | Admit: 2016-02-09 | Discharge: 2016-02-09 | Disposition: A | Discharge status: Home / Self Care | Payer: Self-pay | Attending: Emergency Medicine | Admitting: Emergency Medicine

## 2016-02-09 DIAGNOSIS — Z79899 Other long term (current) drug therapy: Secondary | ICD-10-CM | POA: Insufficient documentation

## 2016-02-09 DIAGNOSIS — R002 Palpitations: Secondary | ICD-10-CM

## 2016-02-09 DIAGNOSIS — F419 Anxiety disorder, unspecified: Secondary | ICD-10-CM | POA: Insufficient documentation

## 2016-02-09 DIAGNOSIS — I10 Essential (primary) hypertension: Secondary | ICD-10-CM | POA: Insufficient documentation

## 2016-02-09 LAB — CBC
HEMATOCRIT: 40.6 % (ref 39.0–52.0)
HEMOGLOBIN: 14 g/dL (ref 13.0–17.0)
MCH: 30.4 pg (ref 26.0–34.0)
MCHC: 34.5 g/dL (ref 30.0–36.0)
MCV: 88.3 fL (ref 78.0–100.0)
Platelets: 197 10*3/uL (ref 150–400)
RBC: 4.6 MIL/uL (ref 4.22–5.81)
RDW: 12.4 % (ref 11.5–15.5)
WBC: 6.4 10*3/uL (ref 4.0–10.5)

## 2016-02-09 LAB — BASIC METABOLIC PANEL
ANION GAP: 7 (ref 5–15)
BUN: 15 mg/dL (ref 6–20)
CALCIUM: 8.7 mg/dL — AB (ref 8.9–10.3)
CHLORIDE: 102 mmol/L (ref 101–111)
CO2: 25 mmol/L (ref 22–32)
Creatinine, Ser: 0.66 mg/dL (ref 0.61–1.24)
GFR calc Af Amer: >60 mL/min (ref >60)
GFR calc non Af Amer: >60 mL/min (ref >60)
GLUCOSE: 113 mg/dL — AB (ref 65–99)
Potassium: 3.5 mmol/L (ref 3.5–5.1)
Sodium: 134 mmol/L — ABNORMAL LOW (ref 135–145)

## 2016-02-09 LAB — TROPONIN I

## 2016-02-09 MED ORDER — ALPRAZOLAM 0.25 MG PO TABS: 0.2500 mg | ORAL_TABLET | Freq: Three times a day (TID) | ORAL | 0 refills | Status: DC | PRN

## 2016-02-09 NOTE — ED Notes (Signed)
Pt verbalized understanding of no driving within 4 hours of taking anxiety medication

## 2016-02-09 NOTE — ED Triage Notes (Signed)
Pt states he has been having a fluttering sensation without pain for the last three days.  Received a shock from a 220V live line last week at work.  Was evaluated then.

## 2016-02-09 NOTE — ED Notes (Signed)
ED Provider at bedside. 

## 2016-02-09 NOTE — ED Notes (Signed)
Patient transported to X-ray 

## 2016-02-09 NOTE — ED Provider Notes (Signed)
[facility] Provider Note   CSN: [id number] Arrival date & time: 02/09/16  1855     History   Chief Complaint Chief Complaint  Patient presents with  . Palpitations    HPI Joseph Hogan is a 55 y.o. male.  HPI Patient states he's had increased stress at work over the last few days and is experiencing palpitations, anxiety, difficulty sleeping, decreased appetite and intermittent crying. Denies any chest pain or shortness of breath. No fever or chills. No new lower extremity swelling or pain. No previous history of mental illness. Recently started on amlodipine. Patient states he's been compliant with medications other than he has not taken his metoprolol this evening.  Past Medical History:  Diagnosis Date  . Hypertension   . MRSA (methicillin resistant Staphylococcus aureus)     Patient Active Problem List   Diagnosis Date Noted  . Bilateral knee pain 06/29/2015  . 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 Prediabetes 04/11/2015  . Obesity, unspecified 01/12/2015  . Essential hypertension, benign 01/10/2015    History reviewed. No pertinent surgical history.     Home Medications    Prior to Admission medications   Medication Sig Start Date End Date Taking? Authorizing Provider  amLODipine (NORVASC) 5 MG tablet Take 1 tablet (5 mg total) by mouth daily. 01/10/16  Yes Soyla Dryer, PA-C  Ascorbic Acid (VITAMIN C PO) Take 1 tablet by mouth daily.   Yes Historical Provider, MD  B Complex Vitamins (VITAMIN B COMPLEX PO) Take 1 tablet by mouth daily.   Yes Historical Provider, MD  cholecalciferol (VITAMIN D) 1000 units tablet Take 1,000 Units by mouth daily.   Yes Historical Provider, MD  GARLIC PO Take 1 tablet by mouth daily.   Yes Historical Provider, MD  metoprolol (LOPRESSOR) 50 MG tablet Take 1 tablet (50 mg total) by mouth 2 (two) times daily. 01/10/16  Yes Soyla Dryer, PA-C  Multiple Vitamin (MULTIVITAMIN WITH MINERALS) TABS tablet Take 1 tablet by mouth  daily.   Yes Historical Provider, MD  OVER THE COUNTER MEDICATION Take 1 capsule by mouth daily. BEET ROOT   Yes Historical Provider, MD  vitamin E 200 UNIT capsule Take 200 Units by mouth daily.   Yes Historical Provider, MD  ALPRAZolam (XANAX) 0.25 MG tablet Take 1 tablet (0.25 mg total) by mouth 3 (three) times daily as needed for anxiety. 02/09/16   Julianne [facility], MD    Family History Family History  Problem Relation Age of Onset  . Stroke Mother   . Hypertension Mother   . Heart disease Father   . Alzheimer's disease Father   . Hypertension Brother   . Alcohol abuse Brother   . Cancer Maternal Aunt   . Heart disease Maternal Aunt   . Cancer Maternal Uncle   . Heart disease Maternal Uncle   . Cancer Paternal Aunt   . Heart disease Paternal Aunt   . Cancer Paternal Uncle   . Heart disease Paternal Uncle   . Heart disease Paternal Grandmother   . Alzheimer's disease Paternal Grandfather     Social History Social History  Substance Use Topics  . Smoking status: Never Smoker  . Smokeless tobacco: Never Used  . Alcohol use No     Allergies   Codeine and Lisinopril   Review of Systems Review of Systems  Constitutional: Positive for appetite change. Negative for chills and fever.  HENT: Negative for sore throat and trouble swallowing.   Eyes: Negative for visual disturbance.  Respiratory: Negative for  cough, shortness of breath and wheezing.   Cardiovascular: Positive for palpitations. Negative for chest pain and leg swelling.  Gastrointestinal: Negative for abdominal pain, diarrhea, nausea and vomiting.  Musculoskeletal: Negative for back pain, myalgias, neck pain and neck stiffness.  Skin: Negative for rash and wound.  Neurological: Negative for dizziness, syncope, weakness, light-headedness, numbness and headaches.  Psychiatric/Behavioral: Positive for sleep disturbance. The patient is nervous/anxious.   All other systems reviewed and are  negative.    Physical Exam Updated Vital Signs BP 154/82   Pulse 62   Temp 97.6 F (36.4 C) (Oral)   Resp 15   Ht 5\' 10"  (1.778 m)   Wt 270 lb (122.5 kg)   SpO2 97%   BMI 38.74 kg/m   Physical Exam  Constitutional: He is oriented to person, place, and time. He appears well-developed and well-nourished. No distress.  HENT:  Head: Normocephalic and atraumatic.  Mouth/Throat: Oropharynx is clear and moist. No oropharyngeal exudate.  Eyes: EOM are normal. Pupils are equal, round, and reactive to light.  Neck: Normal range of motion. Neck supple. No thyromegaly present.  Cardiovascular: Normal rate and regular rhythm.  Exam reveals no gallop and no friction rub.   No murmur heard. Pulmonary/Chest: Effort normal and breath sounds normal. No respiratory distress. He has no wheezes. He has no rales. He exhibits no tenderness.  Abdominal: Soft. Bowel sounds are normal. There is no tenderness. There is no rebound and no guarding.  Musculoskeletal: Normal range of motion. He exhibits no edema or tenderness.  No lower extremity swelling, asymmetry or tenderness.  Neurological: He is alert and oriented to person, place, and time.  Moving all extremity is without deficit. Sensation intact. No tremor noted.  Skin: Skin is warm and dry. Capillary refill takes less than 2 seconds. No rash noted. He is not diaphoretic. No erythema.  Psychiatric:  Mildly anxious  Nursing note and vitals reviewed.    ED Treatments / Results  Labs (all labs ordered are listed, but only abnormal results are displayed) Labs Reviewed  BASIC METABOLIC PANEL - Abnormal; Notable for the following:       Result Value   Sodium 134 (*)    Glucose, Bld 113 (*)    Calcium 8.7 (*)    All other components within normal limits  CBC  TROPONIN I    EKG  EKG Interpretation  Date/Time:  Friday February 09 2016 19:00:33 EST Ventricular Rate:  72 PR Interval:  168 QRS Duration: 106 QT Interval:  402 QTC  Calculation: 440 R Axis:   -31 Text Interpretation:  Normal sinus rhythm Left axis deviation Abnormal ECG Confirmed by Lita Mains  MD, Chasya Zenz ([id number]) on 02/09/2016 7:40:10 PM       Radiology Dg Chest 2 View  Result Date: 02/09/2016 CLINICAL DATA:  Palpitations started yesterday. Patient was electrocuted by touching a wire at work last week. History of hypertension, MRSA. EXAM: CHEST  2 VIEW COMPARISON:  11/20/2014 FINDINGS: The heart size and mediastinal contours are within normal limits. Both lungs are clear. The visualized skeletal structures are unremarkable. IMPRESSION: No active cardiopulmonary disease. Electronically Signed   By: Nolon Nations M.D.   On: 02/09/2016 19:53    Procedures Procedures (including critical care time)  Medications Ordered in ED Medications - No data to display   Initial Impression / Assessment and Plan / ED Course  I have reviewed the triage vital signs and the nursing notes.  Pertinent labs & imaging results that were available during  my care of the patient were reviewed by me and considered in my medical decision making (see chart for details).  Clinical Course    Normal sinus rhythm while in the emergency department. Laboratory workup is normal. Symptoms likely related to stress and anxiety. Given small supply of Xanax and advised to follow-up with his primary physician for possible psychiatry referral should his symptoms persist.   Final Clinical Impressions(s) / ED Diagnoses   Final diagnoses:  Anxiety  Palpitations    New Prescriptions Discharge Medication List as of 02/09/2016  9:14 PM    START taking these medications   Details  ALPRAZolam (XANAX) 0.25 MG tablet Take 1 tablet (0.25 mg total) by mouth 3 (three) times daily as needed for anxiety., Starting Fri 02/09/2016, Print         Julianne [facility], MD 02/09/16 2140

## 2016-02-13 ENCOUNTER — Encounter: Payer: Self-pay | Admitting: Physician Assistant

## 2016-02-13 ENCOUNTER — Ambulatory Visit: Payer: Self-pay | Admitting: Physician Assistant

## 2016-02-13 VITALS — BP 142/74 | HR 59 | Temp 99.3°F | Ht 69.0 in | Wt 278.8 lb

## 2016-02-13 DIAGNOSIS — J111 Influenza due to unidentified influenza virus with other respiratory manifestations: Secondary | ICD-10-CM

## 2016-02-13 DIAGNOSIS — F419 Anxiety disorder, unspecified: Secondary | ICD-10-CM

## 2016-02-13 DIAGNOSIS — I10 Essential (primary) hypertension: Secondary | ICD-10-CM

## 2016-02-13 DIAGNOSIS — R109 Unspecified abdominal pain: Secondary | ICD-10-CM

## 2016-02-13 DIAGNOSIS — R69 Illness, unspecified: Secondary | ICD-10-CM

## 2016-02-13 LAB — POCT URINALYSIS DIPSTICK
GLUCOSE UA: NEGATIVE
Leukocytes, UA: NEGATIVE
Nitrite, UA: NEGATIVE
Protein, UA: 30
UROBILINOGEN UA: 0.2
pH, UA: 5.5

## 2016-02-13 MED ORDER — AMLODIPINE BESYLATE 10 MG PO TABS: 10.0000 mg | ORAL_TABLET | Freq: Every day | ORAL | 1 refills | Status: DC

## 2016-02-13 NOTE — Patient Instructions (Signed)

## 2016-02-13 NOTE — Progress Notes (Signed)
BP (!) 142/74   Pulse (!) 59   Temp 99.3 F (37.4 C)   Ht 5\' 9"  (1.753 m)   Wt 278 lb 12 oz (126.4 kg)   SpO2 96%   BMI 41.16 kg/m    Subjective:    Patient ID: Joseph Hogan, male    DOB: [birth year], 58 y.o.   MRN: [id number]  HPI: Joseph Hogan is a 56 y.o. male presenting on 02/13/2016 for Hypertension   HPI   Pt is hurting today- left side of his back.  Pt states it started about 2 hours ago.   No dysuria.   No hx kidney stone.   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   Pt says he got shocked at work on 01/29/16.    He says he is all good from that   He says he is very stressed.  Says there is stress at work - there is one person who is creating a lot of problems.    Pt says he knew it was anxiety when he went to the ER on 02/09/16.  He says he is still having a little bit of the anxiety.  He says he was told to go to therapy but he hasn't called anywhere yet b/c everywhere has been closed for the holidays.   He says he has only used a little bit of the benzo that he was given because it makes him very sleepy and unmotivated.  Pt feeling nausea for 2 day and sick.  He says he just feels bad all over.  No emesis or diarrhea.  Not eating due to no appetite.  He does have some cough that started last night.  Also wheezing.   No EA or ST.   Pt is a never smoker.  His 35 yo son is at home "quite sick" with suspected flu.  Relevant past medical, surgical, family and social history reviewed and updated as indicated. Interim medical history since our last visit reviewed. Allergies and medications reviewed and updated.   Current Outpatient Prescriptions:  .  ALPRAZolam (XANAX) 0.25 MG tablet, Take 1 tablet (0.25 mg total) by mouth 3 (three) times daily as needed for anxiety., Disp: 10 tablet, Rfl: 0 .  amLODipine (NORVASC) 5 MG tablet, Take 1 tablet (5 mg total) by mouth daily., Disp: 30 tablet, Rfl: 1 .  Ascorbic Acid (VITAMIN C PO), Take 1 tablet by mouth  daily., Disp: , Rfl:  .  B Complex Vitamins (VITAMIN B COMPLEX PO), Take 1 tablet by mouth daily., Disp: , Rfl:  .  cholecalciferol (VITAMIN D) 1000 units tablet, Take 1,000 Units by mouth daily., Disp: , Rfl:  .  diclofenac (VOLTAREN) 75 MG EC tablet, Take 75 mg by mouth 2 (two) times daily as needed for mild pain., Disp: , Rfl:  .  GARLIC PO, Take 1 tablet by mouth daily., Disp: , Rfl:  .  metoprolol (LOPRESSOR) 50 MG tablet, Take 1 tablet (50 mg total) by mouth 2 (two) times daily., Disp: 60 tablet, Rfl: 2 .  Multiple Vitamin (MULTIVITAMIN WITH MINERALS) TABS tablet, Take 1 tablet by mouth daily., Disp: , Rfl:  .  OVER THE COUNTER MEDICATION, Take 1 capsule by mouth daily. BEET ROOT, Disp: , Rfl:  .  vitamin E 200 UNIT capsule, Take 200 Units by mouth daily., Disp: , Rfl:    Review of Systems  Constitutional: Positive for appetite change.  Respiratory: Positive for cough.   Gastrointestinal: Positive for abdominal  pain.  Musculoskeletal: Positive for back pain.  Psychiatric/Behavioral: The patient is nervous/anxious.     Per HPI unless specifically indicated above     Objective:    BP (!) 142/74   Pulse (!) 59   Temp 99.3 F (37.4 C)   Ht 5\' 9"  (1.753 m)   Wt 278 lb 12 oz (126.4 kg)   SpO2 96%   BMI 41.16 kg/m   Wt Readings from Last 3 Encounters:  02/13/16 278 lb 12 oz (126.4 kg)  02/09/16 270 lb (122.5 kg)  01/10/16 279 lb (126.6 kg)    Physical Exam  Constitutional: He is oriented to person, place, and time. He appears well-developed and well-nourished.  HENT:  Head: Normocephalic and atraumatic.  Right Ear: Hearing, tympanic membrane, external ear and ear canal normal.  Left Ear: Hearing, tympanic membrane, external ear and ear canal normal.  Nose: Nose normal.  Mouth/Throat: Uvula is midline and oropharynx is clear and moist. No uvula swelling. No oropharyngeal exudate, posterior oropharyngeal edema, posterior oropharyngeal erythema or tonsillar abscesses.  Neck:  Neck supple.  Cardiovascular: Normal rate and regular rhythm.   Pulmonary/Chest: Effort normal and breath sounds normal. He has no wheezes.  Abdominal: Soft. Bowel sounds are normal. He exhibits no distension. There is no hepatosplenomegaly. There is no tenderness. There is no rigidity, no rebound, no guarding and no CVA tenderness.  Musculoskeletal: He exhibits no edema.  Lymphadenopathy:    He has no cervical adenopathy.  Neurological: He is alert and oriented to person, place, and time.  Skin: Skin is warm and dry.  Psychiatric: He has a normal mood and affect. His behavior is normal.  Vitals reviewed.  UA reviewed     Assessment & Plan:   Encounter Diagnoses  Name Primary?  . Essential hypertension, benign Yes  . Influenza-like illness   . Anxiety   . Flank pain      -Increase amlodipine for blood pressure -discussed with pt. Likely flu- rest fluids, ibu or apap prn (no IBU with diclofenac). gaven handout on flu and gave work note to be out 3 days. -will have Check on [employer].  She did and says he was only approved for one visit so she gave him a new application to complete and turn in -encouraged pt to Call for counseling- gave cards for [facility] -F/u 1 month to recheck BP.  RTO sooner prn

## 2016-02-14 ENCOUNTER — Ambulatory Visit: Payer: Self-pay | Admitting: Physician Assistant

## 2016-02-22 ENCOUNTER — Encounter (HOSPITAL_COMMUNITY): Payer: Self-pay | Admitting: Emergency Medicine

## 2016-02-22 ENCOUNTER — Emergency Department (HOSPITAL_COMMUNITY): Payer: Self-pay

## 2016-02-22 ENCOUNTER — Ambulatory Visit: Payer: Self-pay | Admitting: Physician Assistant

## 2016-02-22 ENCOUNTER — Encounter: Payer: Self-pay | Admitting: Physician Assistant

## 2016-02-22 ENCOUNTER — Emergency Department (HOSPITAL_COMMUNITY)
Admission: EM | Admit: 2016-02-22 | Discharge: 2016-02-22 | Disposition: A | Discharge status: Home / Self Care | Payer: Self-pay | Attending: Emergency Medicine | Admitting: Emergency Medicine

## 2016-02-22 VITALS — BP 170/82 | HR 61 | Temp 98.1°F | Ht 69.0 in | Wt 275.5 lb

## 2016-02-22 DIAGNOSIS — R002 Palpitations: Secondary | ICD-10-CM

## 2016-02-22 DIAGNOSIS — R001 Bradycardia, unspecified: Secondary | ICD-10-CM | POA: Insufficient documentation

## 2016-02-22 DIAGNOSIS — R9431 Abnormal electrocardiogram [ECG] [EKG]: Secondary | ICD-10-CM

## 2016-02-22 DIAGNOSIS — I10 Essential (primary) hypertension: Secondary | ICD-10-CM | POA: Insufficient documentation

## 2016-02-22 DIAGNOSIS — Z79899 Other long term (current) drug therapy: Secondary | ICD-10-CM | POA: Insufficient documentation

## 2016-02-22 LAB — COMPREHENSIVE METABOLIC PANEL
ALT: 47 U/L (ref 17–63)
AST: 31 U/L (ref 15–41)
Albumin: 4.3 g/dL (ref 3.5–5.0)
Alkaline Phosphatase: 44 U/L (ref 38–126)
Anion gap: 9 (ref 5–15)
BUN: 13 mg/dL (ref 6–20)
CHLORIDE: 103 mmol/L (ref 101–111)
CO2: 24 mmol/L (ref 22–32)
Calcium: 8.8 mg/dL — ABNORMAL LOW (ref 8.9–10.3)
Creatinine, Ser: 0.67 mg/dL (ref 0.61–1.24)
Glucose, Bld: 109 mg/dL — ABNORMAL HIGH (ref 65–99)
POTASSIUM: 3.7 mmol/L (ref 3.5–5.1)
Sodium: 136 mmol/L (ref 135–145)
Total Bilirubin: 0.9 mg/dL (ref 0.3–1.2)
Total Protein: 7.6 g/dL (ref 6.5–8.1)

## 2016-02-22 LAB — CBC
HEMATOCRIT: 41 % (ref 39.0–52.0)
Hemoglobin: 14.4 g/dL (ref 13.0–17.0)
MCH: 30.4 pg (ref 26.0–34.0)
MCHC: 35.1 g/dL (ref 30.0–36.0)
MCV: 86.5 fL (ref 78.0–100.0)
Platelets: 253 10*3/uL (ref 150–400)
RBC: 4.74 MIL/uL (ref 4.22–5.81)
RDW: 12.1 % (ref 11.5–15.5)
WBC: 7 10*3/uL (ref 4.0–10.5)

## 2016-02-22 LAB — TROPONIN I: Troponin I: <0.03 ng/mL (ref <0.03)

## 2016-02-22 MED ORDER — ASPIRIN 81 MG PO CHEW
405.0000 mg | CHEWABLE_TABLET | Freq: Once | ORAL | Status: AC
  Administered 2016-02-22: 405 mg via ORAL

## 2016-02-22 NOTE — ED Triage Notes (Signed)
Patient states he was sent by PCP for abnormal EKG. Denies chest pain. States he has had intermittent palpitations and anxiety and states today symptoms started this morning at work.

## 2016-02-22 NOTE — Discharge Instructions (Signed)
The testing today in the ER confirms that you DO have an abnormal EKG but the other testing was normal - you MUST see the cardiologist - I have called them and they will call you to arrange follow up in the office.  You are to return immediately to the ER for severe or worsening palpitations / chest pain / shortness of breath or swelling of the legs.

## 2016-02-22 NOTE — ED Provider Notes (Signed)
[name]   CSN: [id number] Arrival date & time: 02/22/16  1431     History   Chief Complaint Chief Complaint  Patient presents with  . Palpitations    HPI Joseph Hogan is a 56 y.o. male.  HPI  The patient is a 56 year old male, he has a known history of high blood pressure and has been taking metoprolol, recently started on amlodipine as well. He states that approximately one month ago he had an accident at work where he was shocked by a high-voltage cable, he had some palpitations but this resolved quickly. He then reports over the last couple of weeks he has had several incidents where he has had an anxiety provoking event which caused him to have some palpitations which were short-lived.He went to the urgent care, he also came to the emergency department. He had during this process noted to be hypertensive and was given additional medications which is why he is now taking amlodipine as well as metoprolol. He reports that he did not sleep well last night because of an anxiety provoking event yesterday at work, this occurred again today at work and he reports that he became very upset, he had significant palpitations again and went to the urgent care again. The EKG there was abnormal and had changed since prior EKG and he was referred to the emergency department. He denies any symptoms at this time including no chest pain or palpitations. Specifically the patient has no history of exertional symptoms whatsoever, no lower strumming swelling, no history of coronary disease, anginal symptoms or exertional problems. He does not smoke cigarettes, drink alcohol or use any drugs of abuse  Past Medical History:  Diagnosis Date  . Hypertension   . MRSA (methicillin resistant Staphylococcus aureus)     Patient Active Problem List   Diagnosis Date Noted  . Bilateral knee pain 06/29/2015  . 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 Prediabetes 04/11/2015  . Obesity,  unspecified 01/12/2015  . Essential hypertension, benign 01/10/2015    History reviewed. No pertinent surgical history.    Home Medications    Prior to Admission medications   Medication Sig Start Date End Date Taking? Authorizing Provider  amLODipine (NORVASC) 10 MG tablet Take 1 tablet (10 mg total) by mouth daily. 02/13/16  Yes Soyla Dryer, PA-C  Ascorbic Acid (VITAMIN C PO) Take 1 tablet by mouth daily.   Yes Historical Provider, MD  B Complex Vitamins (VITAMIN B COMPLEX PO) Take 1 tablet by mouth daily.   Yes Historical Provider, MD  cholecalciferol (VITAMIN D) 1000 units tablet Take 1,000 Units by mouth daily.   Yes Historical Provider, MD  diclofenac (VOLTAREN) 75 MG EC tablet Take 75 mg by mouth 2 (two) times daily as needed for mild pain.   Yes Historical Provider, MD  GARLIC PO Take 1 tablet by mouth daily.   Yes Historical Provider, MD  metoprolol (LOPRESSOR) 50 MG tablet Take 1 tablet (50 mg total) by mouth 2 (two) times daily. 01/10/16  Yes Soyla Dryer, PA-C  Multiple Vitamin (MULTIVITAMIN WITH MINERALS) TABS tablet Take 1 tablet by mouth daily.   Yes Historical Provider, MD  OVER THE COUNTER MEDICATION Take 1 capsule by mouth daily. BEET ROOT   Yes Historical Provider, MD  vitamin E 200 UNIT capsule Take 200 Units by mouth daily.   Yes Historical Provider, MD    Family History Family History  Problem Relation Age of Onset  . Stroke Mother   . Hypertension Mother   .  Heart disease Father   . Alzheimer's disease Father   . Hypertension Brother   . Alcohol abuse Brother   . Cancer Maternal Aunt   . Heart disease Maternal Aunt   . Cancer Maternal Uncle   . Heart disease Maternal Uncle   . Cancer Paternal Aunt   . Heart disease Paternal Aunt   . Cancer Paternal Uncle   . Heart disease Paternal Uncle   . Heart disease Paternal Grandmother   . Alzheimer's disease Paternal Grandfather     Social History Social History  Substance Use Topics  . Smoking  status: Never Smoker  . Smokeless tobacco: Never Used  . Alcohol use No     Allergies   Codeine and Lisinopril   Review of Systems Review of Systems  All other systems reviewed and are negative.    Physical Exam Updated Vital Signs BP 140/82   Pulse (!) 51   Temp 98.1 F (36.7 C) (Oral)   Resp 22   Ht 5\' 10"  (1.778 m)   Wt 270 lb (122.5 kg)   SpO2 98%   BMI 38.74 kg/m   Physical Exam  Constitutional: He appears well-developed and well-nourished. No distress.  HENT:  Head: Normocephalic and atraumatic.  Mouth/Throat: Oropharynx is clear and moist. No oropharyngeal exudate.  Eyes: Conjunctivae and EOM are normal. Pupils are equal, round, and reactive to light. Right eye exhibits no discharge. Left eye exhibits no discharge. No scleral icterus.  Neck: Normal range of motion. Neck supple. No JVD present. No thyromegaly present.  Cardiovascular: Regular rhythm, normal heart sounds and intact distal pulses.  Exam reveals no gallop and no friction rub.   No murmur heard. Bradycardic to 45-50 bpm, strong pulses at the radial arteries, no JVD  Pulmonary/Chest: Effort normal and breath sounds normal. No respiratory distress. He has no wheezes. He has no rales.  Abdominal: Soft. Bowel sounds are normal. He exhibits no distension and no mass. There is no tenderness.  Musculoskeletal: Normal range of motion. He exhibits no edema or tenderness.  Lymphadenopathy:    He has no cervical adenopathy.  Neurological: He is alert. Coordination normal.  Skin: Skin is warm and dry. No rash noted. No erythema.  Psychiatric: He has a normal mood and affect. His behavior is normal.  Nursing note and vitals reviewed.    ED Treatments / Results  Labs (all labs ordered are listed, but only abnormal results are displayed) Labs Reviewed  COMPREHENSIVE METABOLIC PANEL - Abnormal; Notable for the following:       Result Value   Glucose, Bld 109 (*)    Calcium 8.8 (*)    All other components  within normal limits  CBC  TROPONIN I    EKG  EKG Interpretation  Date/Time:  Thursday February 22 2016 14:33:09 EST Ventricular Rate:  58 PR Interval:  176 QRS Duration: 106 QT Interval:  414 QTC Calculation: 406 R Axis:     Text Interpretation:  Sinus bradycardia Nonspecific T wave abnormality Abnormal ECG Since last tracing rate slower and T wave abnormality now prsent Confirmed by Sabra Heck  MD, Tekela Garguilo ([id number]) on 02/22/2016 5:25:25 PM       Radiology Dg Chest 2 View  Result Date: 02/22/2016 CLINICAL DATA:  Heart palpitations. EXAM: CHEST  2 VIEW COMPARISON:  February 09, 2016 FINDINGS: The heart size and mediastinal contours are within normal limits. There is no focal infiltrate, pulmonary edema, or pleural effusion. Degenerative joint changes of the spine are noted. IMPRESSION: No active  cardiopulmonary disease. Electronically Signed   By: Abelardo Diesel M.D.   On: 02/22/2016 14:50    Procedures Procedures (including critical care time)  Medications Ordered in ED Medications - No data to display   Initial Impression / Assessment and Plan / ED Course  I have reviewed the triage vital signs and the nursing notes.  Pertinent labs & imaging results that were available during my care of the patient were reviewed by me and considered in my medical decision making (see chart for details).  Clinical Course      lab results are unremarkable, there is no electrolyte disturbance, his EKG does show some T-wave inversions, this is new and changed from prior EKG performed 2 weeks ago where there were normal upright T waves. Will d/w Caridiolgy.  D/w caridology - they will arrange follow up.  Final Clinical Impressions(s) / ED Diagnoses   Final diagnoses:  Palpitations    New Prescriptions Current Discharge Medication List       Noemi Chapel, MD 02/22/16 [phone]

## 2016-02-22 NOTE — Progress Notes (Signed)
BP (!) 170/82 (BP Location: Left Arm, Patient Position: Sitting, Cuff Size: Normal)   Pulse 61   Temp 98.1 F (36.7 C)   Ht 5\' 9"  (1.753 m)   Wt 275 lb 8 oz (125 kg)   SpO2 98%   BMI 40.68 kg/m    Subjective:    Patient ID: Joseph Hogan, male    DOB: [birth year], 58 y.o.   MRN: [id number]  HPI: Joseph Hogan is a 56 y.o. male presenting on 02/22/2016 for Palpitations   HPI  Pt states "shit hit fan at work" as related last week stress at work.   Pt complains of palpitations that started today.  He believes this related to the stress of work.    BP much higher today than at OV last week.  Pt denies chest pain, SOB.  Relevant past medical, surgical, family and social history reviewed and updated as indicated. Interim medical history since our last visit reviewed. Allergies and medications reviewed and updated.   Current Outpatient Prescriptions:  .  amLODipine (NORVASC) 10 MG tablet, Take 1 tablet (10 mg total) by mouth daily., Disp: 30 tablet, Rfl: 1 .  metoprolol (LOPRESSOR) 50 MG tablet, Take 1 tablet (50 mg total) by mouth 2 (two) times daily., Disp: 60 tablet, Rfl: 2 .  ALPRAZolam (XANAX) 0.25 MG tablet, Take 1 tablet (0.25 mg total) by mouth 3 (three) times daily as needed for anxiety. (Patient not taking: Reported on 02/22/2016), Disp: 10 tablet, Rfl: 0 .  Ascorbic Acid (VITAMIN C PO), Take 1 tablet by mouth daily., Disp: , Rfl:  .  B Complex Vitamins (VITAMIN B COMPLEX PO), Take 1 tablet by mouth daily., Disp: , Rfl:  .  cholecalciferol (VITAMIN D) 1000 units tablet, Take 1,000 Units by mouth daily., Disp: , Rfl:  .  diclofenac (VOLTAREN) 75 MG EC tablet, Take 75 mg by mouth 2 (two) times daily as needed for mild pain., Disp: , Rfl:  .  GARLIC PO, Take 1 tablet by mouth daily., Disp: , Rfl:  .  Multiple Vitamin (MULTIVITAMIN WITH MINERALS) TABS tablet, Take 1 tablet by mouth daily., Disp: , Rfl:  .  OVER THE COUNTER MEDICATION, Take 1 capsule by mouth daily. BEET ROOT,  Disp: , Rfl:  .  vitamin E 200 UNIT capsule, Take 200 Units by mouth daily., Disp: , Rfl:   Review of Systems  Constitutional: Positive for appetite change. Negative for chills, diaphoresis, fatigue, fever and unexpected weight change.  HENT: Negative for congestion, dental problem, drooling, ear pain, facial swelling, hearing loss, mouth sores, sneezing, sore throat, trouble swallowing and voice change.   Eyes: Negative for pain, discharge, redness, itching and visual disturbance.  Respiratory: Negative for cough, choking, shortness of breath and wheezing.   Cardiovascular: Negative for chest pain, palpitations and leg swelling.  Gastrointestinal: Negative for abdominal pain, blood in stool, constipation, diarrhea and vomiting.  Endocrine: Negative for cold intolerance, heat intolerance and polydipsia.  Genitourinary: Negative for decreased urine volume, dysuria and hematuria.  Musculoskeletal: Negative for arthralgias, back pain and gait problem.  Skin: Negative for rash.  Allergic/Immunologic: Negative for environmental allergies.  Neurological: Negative for seizures, syncope, light-headedness and headaches.  Hematological: Negative for adenopathy.  Psychiatric/Behavioral: Negative for agitation, dysphoric mood and suicidal ideas. The patient is nervous/anxious.     Per HPI unless specifically indicated above     Objective:    BP (!) 170/82 (BP Location: Left Arm, Patient Position: Sitting, Cuff Size: Normal)   Pulse  61   Temp 98.1 F (36.7 C)   Ht 5\' 9"  (1.753 m)   Wt 275 lb 8 oz (125 kg)   SpO2 98%   BMI 40.68 kg/m   Wt Readings from Last 3 Encounters:  02/22/16 275 lb 8 oz (125 kg)  02/13/16 278 lb 12 oz (126.4 kg)  02/09/16 270 lb (122.5 kg)    Physical Exam  Constitutional: He is oriented to person, place, and time. He appears well-developed and well-nourished.  HENT:  Head: Normocephalic and atraumatic.  Neck: Neck supple.  Cardiovascular: Normal rate and regular  rhythm.   Pulmonary/Chest: Effort normal and breath sounds normal. He has no wheezes.  Abdominal: Soft. Bowel sounds are normal. There is no hepatosplenomegaly. There is no tenderness.  Musculoskeletal: He exhibits no edema.  Lymphadenopathy:    He has no cervical adenopathy.  Neurological: He is alert and oriented to person, place, and time.  Skin: Skin is warm and dry.  Psychiatric: He has a normal mood and affect. His behavior is normal.  Vitals reviewed.  EKG-  Sinus bradycardia at 55 bpm.  t wave inversion lateral leads which is new compared with EKG from last week  2:26pm asa 81mg  5 chewable PO    Assessment & Plan:    Encounter Diagnoses  Name Primary?  . Palpitations Yes  . Essential hypertension, benign   . Abnormal EKG     -Discussed with pt that due to his elevated BP and his new EKG changes, he should go To ER where a more complete evaluation can be done.  Pt is given aspirin before leaving the office to go to the ER.   -pt has regular OV scheduled for later this month

## 2016-03-06 ENCOUNTER — Other Ambulatory Visit: Payer: Self-pay | Admitting: Physician Assistant

## 2016-03-12 ENCOUNTER — Ambulatory Visit: Payer: Self-pay | Admitting: Physician Assistant

## 2016-03-13 ENCOUNTER — Ambulatory Visit: Payer: Self-pay | Admitting: Physician Assistant

## 2016-03-13 ENCOUNTER — Encounter: Payer: Self-pay | Admitting: Physician Assistant

## 2016-03-13 VITALS — BP 128/64 | HR 46 | Temp 97.7°F | Ht 69.0 in | Wt 275.0 lb

## 2016-03-13 DIAGNOSIS — R9431 Abnormal electrocardiogram [ECG] [EKG]: Secondary | ICD-10-CM

## 2016-03-13 DIAGNOSIS — I10 Essential (primary) hypertension: Secondary | ICD-10-CM

## 2016-03-13 NOTE — Progress Notes (Signed)
BP 128/64 (BP Location: Left Arm, Patient Position: Sitting, Cuff Size: Large)   Pulse (!) 46   Temp 97.7 F (36.5 C)   Ht 5\' 9"  (1.753 m)   Wt 275 lb (124.7 kg)   SpO2 95%   BMI 40.61 kg/m    Subjective:    Patient ID: Joseph Hogan, male    DOB: 1960/05/22, 56 y.o.   MRN: [id number]  HPI: Joseph Hogan is a 56 y.o. male presenting on 03/13/2016 for Hypertension   HPI   Pt seen here 02/22/16.  Pt was  sent to ER for palpitations and new EKG changes.  ER evaluatied and discussed with cardiology. Cardiology was to arrange f/u.   Did have event about a month ago received shock by high voltage line while at work.   Pt has been checking bp at home and says it has mostly stayed in te 130s.  Pt states problems at work all better now so much less stress.  Pt has joined a gym and goal is to lose 25 pounds.  He says he rode on the stationary bike and had no chest pains.  Relevant past medical, surgical, family and social history reviewed and updated as indicated. Interim medical history since our last visit reviewed. Allergies and medications reviewed and updated.   Current Outpatient Prescriptions:  .  amLODipine (NORVASC) 10 MG tablet, Take 1 tablet (10 mg total) by mouth daily., Disp: 30 tablet, Rfl: 1 .  Ascorbic Acid (VITAMIN C PO), Take 1 tablet by mouth daily., Disp: , Rfl:  .  B Complex Vitamins (VITAMIN B COMPLEX PO), Take 1 tablet by mouth daily., Disp: , Rfl:  .  cholecalciferol (VITAMIN D) 1000 units tablet, Take 1,000 Units by mouth daily., Disp: , Rfl:  .  diclofenac (VOLTAREN) 75 MG EC tablet, TAKE ONE TABLET BY MOUTH EVERY 12 HOURS AS NEEDED FOR PAIN, Disp: 30 tablet, Rfl: 1 .  GARLIC PO, Take 1 tablet by mouth daily., Disp: , Rfl:  .  metoprolol (LOPRESSOR) 50 MG tablet, Take 1 tablet (50 mg total) by mouth 2 (two) times daily., Disp: 60 tablet, Rfl: 2 .  Multiple Vitamin (MULTIVITAMIN WITH MINERALS) TABS tablet, Take 1 tablet by mouth daily., Disp: , Rfl:  .  OVER  THE COUNTER MEDICATION, Take 1 capsule by mouth daily. BEET ROOT, Disp: , Rfl:  .  vitamin E 200 UNIT capsule, Take 200 Units by mouth daily., Disp: , Rfl:    Review of Systems  Constitutional: Negative for appetite change, chills, diaphoresis, fatigue, fever and unexpected weight change.  HENT: Negative for congestion, dental problem, drooling, ear pain, facial swelling, hearing loss, mouth sores, sneezing, sore throat, trouble swallowing and voice change.   Eyes: Negative for pain, discharge, redness, itching and visual disturbance.  Respiratory: Negative for cough, choking, shortness of breath and wheezing.   Cardiovascular: Negative for chest pain, palpitations and leg swelling.  Gastrointestinal: Negative for abdominal pain, blood in stool, constipation, diarrhea and vomiting.  Endocrine: Negative for cold intolerance, heat intolerance and polydipsia.  Genitourinary: Negative for decreased urine volume, dysuria and hematuria.  Musculoskeletal: Negative for arthralgias, back pain and gait problem.  Skin: Negative for rash.  Allergic/Immunologic: Negative for environmental allergies.  Neurological: Negative for seizures, syncope, light-headedness and headaches.  Hematological: Negative for adenopathy.  Psychiatric/Behavioral: Negative for agitation, dysphoric mood and suicidal ideas. The patient is not nervous/anxious.     Per HPI unless specifically indicated above     Objective:  BP 128/64 (BP Location: Left Arm, Patient Position: Sitting, Cuff Size: Large)   Pulse (!) 46   Temp 97.7 F (36.5 C)   Ht 5\' 9"  (1.753 m)   Wt 275 lb (124.7 kg)   SpO2 95%   BMI 40.61 kg/m   Wt Readings from Last 3 Encounters:  03/13/16 275 lb (124.7 kg)  02/22/16 270 lb (122.5 kg)  02/22/16 275 lb 8 oz (125 kg)    Physical Exam  Constitutional: He is oriented to person, place, and time. He appears well-developed and well-nourished.  HENT:  Head: Normocephalic and atraumatic.  Neck: Neck  supple.  Cardiovascular: Regular rhythm.  Bradycardia present.   Pulmonary/Chest: Effort normal and breath sounds normal. He has no wheezes.  Abdominal: Soft. Bowel sounds are normal. There is no hepatosplenomegaly. There is no tenderness.  Musculoskeletal: He exhibits no edema.  Lymphadenopathy:    He has no cervical adenopathy.  Neurological: He is alert and oriented to person, place, and time.  Skin: Skin is warm and dry.  Psychiatric: He has a normal mood and affect. His behavior is normal.  Vitals reviewed.    EKG-  Sinus bradycardia.  New T waves still present.  Will refer to cardiology.    Results for orders placed or performed during the hospital encounter of 02/22/16  Comprehensive metabolic panel  Result Value Ref Range   Sodium 136 135 - 145 mmol/L   Potassium 3.7 3.5 - 5.1 mmol/L   Chloride 103 101 - 111 mmol/L   CO2 24 22 - 32 mmol/L   Glucose, Bld 109 (H) 65 - 99 mg/dL   BUN 13 6 - 20 mg/dL   Creatinine, Ser 0.67 0.61 - 1.24 mg/dL   Calcium 8.8 (L) 8.9 - 10.3 mg/dL   Total Protein 7.6 6.5 - 8.1 g/dL   Albumin 4.3 3.5 - 5.0 g/dL   AST 31 15 - 41 U/L   ALT 47 17 - 63 U/L   Alkaline Phosphatase 44 38 - 126 U/L   Total Bilirubin 0.9 0.3 - 1.2 mg/dL   GFR calc non Af Amer >60 >60 mL/min   GFR calc Af Amer >60 >60 mL/min   Anion gap 9 5 - 15  CBC  Result Value Ref Range   WBC 7.0 4.0 - 10.5 K/uL   RBC 4.74 4.22 - 5.81 MIL/uL   Hemoglobin 14.4 13.0 - 17.0 g/dL   HCT 41.0 39.0 - 52.0 %   MCV 86.5 78.0 - 100.0 fL   MCH 30.4 26.0 - 34.0 pg   MCHC 35.1 30.0 - 36.0 g/dL   RDW 12.1 11.5 - 15.5 %   Platelets 253 150 - 400 K/uL  Troponin I  Result Value Ref Range   Troponin I <0.03 <0.03 ng/mL      Assessment & Plan:   Encounter Diagnoses  Name Primary?  . Essential hypertension, benign Yes  . Abnormal EKG      -Refer cardiology -Gave [facility] application -pt to Continue current meds -F/u 6 weeks.  RTO sooner for any problems

## 2016-04-09 ENCOUNTER — Encounter: Payer: Self-pay | Admitting: Cardiology

## 2016-04-14 ENCOUNTER — Other Ambulatory Visit: Payer: Self-pay | Admitting: Physician Assistant

## 2016-04-16 ENCOUNTER — Other Ambulatory Visit: Payer: Self-pay | Admitting: Physician Assistant

## 2016-04-17 ENCOUNTER — Encounter: Payer: Self-pay | Admitting: Cardiovascular Disease

## 2016-04-24 ENCOUNTER — Ambulatory Visit: Payer: Self-pay | Admitting: Physician Assistant

## 2016-05-01 ENCOUNTER — Ambulatory Visit: Payer: Self-pay | Admitting: Physician Assistant

## 2016-05-01 ENCOUNTER — Encounter: Payer: Self-pay | Admitting: Physician Assistant

## 2016-05-01 ENCOUNTER — Other Ambulatory Visit: Payer: Self-pay | Admitting: Physician Assistant

## 2016-05-01 VITALS — BP 152/88 | HR 58 | Temp 97.9°F | Ht 69.0 in | Wt 274.8 lb

## 2016-05-01 DIAGNOSIS — F419 Anxiety disorder, unspecified: Secondary | ICD-10-CM

## 2016-05-01 DIAGNOSIS — Z125 Encounter for screening for malignant neoplasm of prostate: Secondary | ICD-10-CM

## 2016-05-01 DIAGNOSIS — Z1211 Encounter for screening for malignant neoplasm of colon: Secondary | ICD-10-CM

## 2016-05-01 DIAGNOSIS — R9431 Abnormal electrocardiogram [ECG] [EKG]: Secondary | ICD-10-CM

## 2016-05-01 DIAGNOSIS — R002 Palpitations: Secondary | ICD-10-CM

## 2016-05-01 DIAGNOSIS — I10 Essential (primary) hypertension: Secondary | ICD-10-CM

## 2016-05-01 DIAGNOSIS — R7303 Prediabetes: Secondary | ICD-10-CM

## 2016-05-01 NOTE — Progress Notes (Signed)
BP (!) 150/74 (BP Location: Left Arm, Patient Position: Sitting, Cuff Size: Normal)   Pulse (!) 58   Temp 97.9 F (36.6 C)   Ht 5\' 9"  (1.753 m)   Wt 274 lb 12 oz (124.6 kg)   SpO2 97%   BMI 40.57 kg/m    Subjective:    Patient ID: Joseph Hogan, male    DOB: [birth year], 56 y.o.   MRN: [id number]  HPI: Joseph Hogan is a 56 y.o. male presenting on 05/01/2016 for Follow-up   HPI   States bp at home usually 130/60.  He says he is stressed aabout the [facility] [employer] problems (having trouble b/c he turned in his paperwork and now he can't get in touch with anyone because the position has been vacated)  He says the diclofenac doesn't work any more so he is using ice and IBU which helps some.   Pt states he gets the palps whenever he is feeling stressed.    Pt states he was on some anxeity/depression med about 2 years ago.  Pharmacy states it was zoloft.  Pt states he didn't like it- it made him stop eating and not leave the house.    Pt feels fine with climbing 3 flights of stairs at work- no CP or SOB.   Relevant past medical, surgical, family and social history reviewed and updated as indicated. Interim medical history since our last visit reviewed. Allergies and medications reviewed and updated.   Current Outpatient Prescriptions:  .  amLODipine (NORVASC) 10 MG tablet, TAKE 1 TABLET BY MOUTH DAILY, Disp: 30 tablet, Rfl: 3 .  Ascorbic Acid (VITAMIN C PO), Take 1 tablet by mouth daily., Disp: , Rfl:  .  B Complex Vitamins (VITAMIN B COMPLEX PO), Take 1 tablet by mouth daily., Disp: , Rfl:  .  cholecalciferol (VITAMIN D) 1000 units tablet, Take 1,000 Units by mouth daily., Disp: , Rfl:  .  GARLIC PO, Take 1 tablet by mouth daily., Disp: , Rfl:  .  ibuprofen (ADVIL,MOTRIN) 200 MG tablet, Take 200 mg by mouth every 6 (six) hours as needed., Disp: , Rfl:  .  metoprolol (LOPRESSOR) 50 MG tablet, TAKE ONE TABLET BY MOUTH TWICE DAILY, Disp: 60 tablet, Rfl: 2 .  Multiple Vitamin  (MULTIVITAMIN WITH MINERALS) TABS tablet, Take 1 tablet by mouth daily., Disp: , Rfl:  .  OVER THE COUNTER MEDICATION, Take 1 capsule by mouth daily. BEET ROOT, Disp: , Rfl:  .  vitamin E 200 UNIT capsule, Take 200 Units by mouth daily., Disp: , Rfl:  .  diclofenac (VOLTAREN) 75 MG EC tablet, TAKE ONE TABLET BY MOUTH EVERY 12 HOURS AS NEEDED FOR PAIN (Patient not taking: Reported on 05/01/2016), Disp: 30 tablet, Rfl: 1   Review of Systems  Constitutional: Negative for appetite change, chills, diaphoresis, fatigue, fever and unexpected weight change.  HENT: Negative for congestion, dental problem, drooling, ear pain, facial swelling, hearing loss, mouth sores, sneezing, sore throat, trouble swallowing and voice change.   Eyes: Negative for pain, discharge, redness, itching and visual disturbance.  Respiratory: Negative for cough, choking, shortness of breath and wheezing.   Cardiovascular: Negative for chest pain, palpitations and leg swelling.  Gastrointestinal: Negative for abdominal pain, blood in stool, constipation, diarrhea and vomiting.  Endocrine: Negative for cold intolerance, heat intolerance and polydipsia.  Genitourinary: Negative for decreased urine volume, dysuria and hematuria.  Musculoskeletal: Positive for arthralgias. Negative for back pain and gait problem.  Skin: Negative for rash.  Allergic/Immunologic:  Negative for environmental allergies.  Neurological: Negative for seizures, syncope, light-headedness and headaches.  Hematological: Negative for adenopathy.  Psychiatric/Behavioral: Negative for agitation, dysphoric mood and suicidal ideas. The patient is not nervous/anxious.     Per HPI unless specifically indicated above     Objective:    BP (!) 150/74 (BP Location: Left Arm, Patient Position: Sitting, Cuff Size: Normal)   Pulse (!) 58   Temp 97.9 F (36.6 C)   Ht 5\' 9"  (1.753 m)   Wt 274 lb 12 oz (124.6 kg)   SpO2 97%   BMI 40.57 kg/m   Wt Readings from Last  3 Encounters:  05/01/16 274 lb 12 oz (124.6 kg)  03/13/16 275 lb (124.7 kg)  02/22/16 270 lb (122.5 kg)    Physical Exam  Constitutional: He is oriented to person, place, and time. He appears well-developed and well-nourished.  HENT:  Head: Normocephalic and atraumatic.  Neck: Neck supple.  Cardiovascular: Normal rate and regular rhythm.   Pulmonary/Chest: Effort normal and breath sounds normal. He has no wheezes.  Abdominal: Soft. Bowel sounds are normal. There is no hepatosplenomegaly. There is no tenderness.  Musculoskeletal: He exhibits no edema.  Lymphadenopathy:    He has no cervical adenopathy.  Neurological: He is alert and oriented to person, place, and time.  Skin: Skin is warm and dry.  Psychiatric: He has a normal mood and affect. His behavior is normal.  Vitals reviewed.        Assessment & Plan:   Encounter Diagnoses  Name Primary?  . Essential hypertension, benign Yes  . Abnormal EKG   . Palpitations   . Anxiety   . Morbid obesity, unspecified obesity type ([place])   . Special screening for malignant neoplasm of colon   . Screening for prostate cancer   . Prediabetes      -Counseled pt on stress reduction  -Continue current medications -he will continue to monitor bp at home -pt given iFOBT for colon cancer screening -pt given [facility] Customer Service number so he can get assistance with his [facility] [employer] application.  His referral to cardiology is already in the system -follow up 6 weeks.  RTO sooner for bp elevations (higher than 130) or new symptoms

## 2016-05-01 NOTE — Patient Instructions (Addendum)
Customer Service [phone] 

## 2016-05-09 LAB — IFOBT (OCCULT BLOOD): IFOBT: NEGATIVE

## 2016-06-04 ENCOUNTER — Other Ambulatory Visit: Payer: Self-pay | Admitting: Physician Assistant

## 2016-06-04 DIAGNOSIS — R7303 Prediabetes: Secondary | ICD-10-CM

## 2016-06-04 DIAGNOSIS — I10 Essential (primary) hypertension: Secondary | ICD-10-CM

## 2016-06-04 DIAGNOSIS — Z125 Encounter for screening for malignant neoplasm of prostate: Secondary | ICD-10-CM

## 2016-06-04 DIAGNOSIS — Z1159 Encounter for screening for other viral diseases: Secondary | ICD-10-CM

## 2016-06-05 LAB — BASIC METABOLIC PANEL
BUN: 11 mg/dL (ref 7–25)
CALCIUM: 9.1 mg/dL (ref 8.6–10.3)
CO2: 23 mmol/L (ref 20–31)
CREATININE: 0.77 mg/dL (ref 0.70–1.33)
Chloride: 104 mmol/L (ref 98–110)
Glucose, Bld: 111 mg/dL — ABNORMAL HIGH (ref 65–99)
Potassium: 4.2 mmol/L (ref 3.5–5.3)
Sodium: 138 mmol/L (ref 135–146)

## 2016-06-06 LAB — HEMOGLOBIN A1C
HEMOGLOBIN A1C: 6.6 % — AB (ref <5.7)
MEAN PLASMA GLUCOSE: 143 mg/dL

## 2016-06-06 LAB — HEPATITIS C ANTIBODY: HCV Ab: NEGATIVE

## 2016-06-06 LAB — PSA: PSA: 0.8 ng/mL (ref <=4.0)

## 2016-06-12 ENCOUNTER — Ambulatory Visit: Payer: Self-pay | Admitting: Physician Assistant

## 2016-06-13 ENCOUNTER — Encounter: Payer: Self-pay | Admitting: Physician Assistant

## 2016-06-13 ENCOUNTER — Ambulatory Visit: Payer: Self-pay | Admitting: Physician Assistant

## 2016-06-13 VITALS — BP 140/70 | HR 48 | Temp 97.9°F | Ht 69.0 in | Wt 273.2 lb

## 2016-06-13 DIAGNOSIS — M170 Bilateral primary osteoarthritis of knee: Secondary | ICD-10-CM

## 2016-06-13 DIAGNOSIS — E118 Type 2 diabetes mellitus with unspecified complications: Secondary | ICD-10-CM

## 2016-06-13 DIAGNOSIS — I10 Essential (primary) hypertension: Secondary | ICD-10-CM

## 2016-06-13 DIAGNOSIS — R002 Palpitations: Secondary | ICD-10-CM

## 2016-06-13 MED ORDER — HYDROCHLOROTHIAZIDE 12.5 MG PO CAPS: 12.5000 mg | ORAL_CAPSULE | Freq: Every day | ORAL | 3 refills | Status: DC

## 2016-06-13 MED ORDER — METFORMIN HCL ER 500 MG PO TB24: 500.0000 mg | ORAL_TABLET | Freq: Every day | ORAL | 3 refills | Status: DC

## 2016-06-13 NOTE — Progress Notes (Signed)
BP 140/70 (BP Location: Left Arm, Patient Position: Sitting, Cuff Size: Normal)   Pulse (!) 48   Temp 97.9 F (36.6 C)   Ht 5\' 9"  (1.753 m)   Wt 273 lb 4 oz (123.9 kg)   SpO2 96%   BMI 40.35 kg/m    Subjective:    Patient ID: Joseph Hogan, male    DOB: 1960-07-31, 56 y.o.   MRN: [id number]  HPI: Joseph Hogan is a 56 y.o. male presenting on 06/13/2016 for Hypertension   HPI   Pt still strying to get to [facility] so he can get knee replacement  Relevant past medical, surgical, family and social history reviewed and updated as indicated. Interim medical history since our last visit reviewed. Allergies and medications reviewed and updated.  Review of Systems  Constitutional: Negative for appetite change, chills, diaphoresis, fatigue, fever and unexpected weight change.  HENT: Negative for congestion, dental problem, drooling, ear pain, facial swelling, hearing loss, mouth sores, sneezing, sore throat, trouble swallowing and voice change.   Eyes: Negative for pain, discharge, redness, itching and visual disturbance.  Respiratory: Negative for cough, choking, shortness of breath and wheezing.   Cardiovascular: Negative for chest pain, palpitations and leg swelling.  Gastrointestinal: Negative for abdominal pain, blood in stool, constipation, diarrhea and vomiting.  Endocrine: Negative for cold intolerance, heat intolerance and polydipsia.  Genitourinary: Negative for decreased urine volume, dysuria and hematuria.  Musculoskeletal: Positive for arthralgias. Negative for back pain and gait problem.  Skin: Negative for rash.  Allergic/Immunologic: Negative for environmental allergies.  Neurological: Negative for seizures, syncope, light-headedness and headaches.  Hematological: Negative for adenopathy.  Psychiatric/Behavioral: Negative for agitation, dysphoric mood and suicidal ideas. The patient is not nervous/anxious.     Per HPI unless specifically indicated above      Objective:    BP 140/70 (BP Location: Left Arm, Patient Position: Sitting, Cuff Size: Normal)   Pulse (!) 48   Temp 97.9 F (36.6 C)   Ht 5\' 9"  (1.753 m)   Wt 273 lb 4 oz (123.9 kg)   SpO2 96%   BMI 40.35 kg/m   Wt Readings from Last 3 Encounters:  06/13/16 273 lb 4 oz (123.9 kg)  05/01/16 274 lb 12 oz (124.6 kg)  03/13/16 275 lb (124.7 kg)    Physical Exam  Constitutional: He is oriented to person, place, and time. He appears well-developed and well-nourished.  HENT:  Head: Normocephalic and atraumatic.  Neck: Neck supple.  Cardiovascular: Normal rate and regular rhythm.   Pulmonary/Chest: Effort normal and breath sounds normal. He has no wheezes.  Abdominal: Soft. Bowel sounds are normal. There is no hepatosplenomegaly. There is no tenderness.  Musculoskeletal: He exhibits no edema.  Lymphadenopathy:    He has no cervical adenopathy.  Neurological: He is alert and oriented to person, place, and time.  Skin: Skin is warm and dry.  Psychiatric: He has a normal mood and affect. His behavior is normal.  Vitals reviewed.   Results for orders placed or performed in visit on 06/04/16  HgB A1c  Result Value Ref Range   Hgb A1c MFr Bld 6.6 (H) <5.7 %   Mean Plasma Glucose 143 mg/dL  PSA  Result Value Ref Range   PSA 0.8 <=4.0 ng/mL  Basic Metabolic Panel (BMET)  Result Value Ref Range   Sodium 138 135 - 146 mmol/L   Potassium 4.2 3.5 - 5.3 mmol/L   Chloride 104 98 - 110 mmol/L   CO2 23  20 - 31 mmol/L   Glucose, Bld 111 (H) 65 - 99 mg/dL   BUN 11 7 - 25 mg/dL   Creat 0.77 0.70 - 1.33 mg/dL   Calcium 9.1 8.6 - 10.3 mg/dL  Hepatitis C Antibody  Result Value Ref Range   HCV Ab NEGATIVE NEGATIVE      Assessment & Plan:   Encounter Diagnoses  Name Primary?  . Essential hypertension, benign Yes  . Palpitations   . Morbid obesity, unspecified obesity type ([place])   . Controlled diabetes mellitus type 2 with complications, unspecified whether long term insulin use ([place])    . Osteoarthritis of both knees, unspecified osteoarthritis type      -reviewed labs with pt -pt to Go to DM educ class. Start metformin. Counseled on diabetic diet and gave handout -Cut back metoprolol to 25mg  twice daily due to bradycardia -Continue amlodipine 10mg  daily -Start hctz 12.5mg  daily -counseled pt to get his [facility] in order so he can get back to orthopedics.  He is given phone number for customer service -follow up one month to recheck the blood pressure, do foot exam

## 2016-06-13 NOTE — Patient Instructions (Addendum)
Go to DM education class. Start metformin Cut back metoprolol to 25mg  twice daily Continue amlodipine 10mg  daily Start hctz 12.5mg  daily [facility]- [phone]       Diabetes Mellitus and Food It is important for you to manage your blood sugar (glucose) level. Your blood glucose level can be greatly affected by what you eat. Eating healthier foods in the appropriate amounts throughout the day at about the same time each day will help you control your blood glucose level. It can also help slow or prevent worsening of your diabetes mellitus. Healthy eating may even help you improve the level of your blood pressure and reach or maintain a healthy weight. General recommendations for healthful eating and cooking habits include:  Eating meals and snacks regularly. Avoid going long periods of time without eating to lose weight.  Eating a diet that consists mainly of plant-based foods, such as fruits, vegetables, nuts, legumes, and whole grains.  Using low-heat cooking methods, such as baking, instead of high-heat cooking methods, such as deep frying. Work with your dietitian to make sure you understand how to use the Nutrition Facts information on food labels. How can food affect me? Carbohydrates  Carbohydrates affect your blood glucose level more than any other type of food. Your dietitian will help you determine how many carbohydrates to eat at each meal and teach you how to count carbohydrates. Counting carbohydrates is important to keep your blood glucose at a healthy level, especially if you are using insulin or taking certain medicines for diabetes mellitus. Alcohol  Alcohol can cause sudden decreases in blood glucose (hypoglycemia), especially if you use insulin or take certain medicines for diabetes mellitus. Hypoglycemia can be a life-threatening condition. Symptoms of hypoglycemia (sleepiness, dizziness, and disorientation) are similar to symptoms of having too much  alcohol. If your health care provider has given you approval to drink alcohol, do so in moderation and use the following guidelines:  Women should not have more than one drink per day, and men should not have more than two drinks per day. One drink is equal to:  12 oz of beer.  5 oz of wine.  1 oz of hard liquor.  Do not drink on an empty stomach.  Keep yourself hydrated. Have water, diet soda, or unsweetened iced tea.  Regular soda, juice, and other mixers might contain a lot of carbohydrates and should be counted. What foods are not recommended? As you make food choices, it is important to remember that all foods are not the same. Some foods have fewer nutrients per serving than other foods, even though they might have the same number of calories or carbohydrates. It is difficult to get your body what it needs when you eat foods with fewer nutrients. Examples of foods that you should avoid that are high in calories and carbohydrates but low in nutrients include:  Trans fats (most processed foods list trans fats on the Nutrition Facts label).  Regular soda.  Juice.  Candy.  Sweets, such as cake, pie, doughnuts, and cookies.  Fried foods. What foods can I eat? Eat nutrient-rich foods, which will nourish your body and keep you healthy. The food you should eat also will depend on several factors, including:  The calories you need.  The medicines you take.  Your weight.  Your blood glucose level.  Your blood pressure level.  Your cholesterol level. You should eat a variety of foods, including:  Protein.  Lean cuts of meat.  Proteins low in  saturated fats, such as fish, egg whites, and beans. Avoid processed meats.  Fruits and vegetables.  Fruits and vegetables that may help control blood glucose levels, such as apples, mangoes, and yams.  Dairy products.  Choose fat-free or low-fat dairy products, such as milk, yogurt, and cheese.  Grains, bread, pasta, and  rice.  Choose whole grain products, such as multigrain bread, whole oats, and brown rice. These foods may help control blood pressure.  Fats.  Foods containing healthful fats, such as nuts, avocado, olive oil, canola oil, and fish. Does everyone with diabetes mellitus have the same meal plan? Because every person with diabetes mellitus is different, there is not one meal plan that works for everyone. It is very important that you meet with a dietitian who will help you create a meal plan that is just right for you. This information is not intended to replace advice given to you by your health care provider. Make sure you discuss any questions you have with your health care provider. Document Released: 10/25/2004 Document Revised: 07/06/2015 Document Reviewed: 12/25/2012 Elsevier Interactive Patient Education  2017 [employer].

## 2016-06-17 ENCOUNTER — Other Ambulatory Visit: Payer: Self-pay | Admitting: Physician Assistant

## 2016-06-18 ENCOUNTER — Other Ambulatory Visit: Payer: Self-pay | Admitting: Physician Assistant

## 2016-06-18 MED ORDER — AMLODIPINE BESYLATE 10 MG PO TABS
10.0000 mg | ORAL_TABLET | Freq: Every day | ORAL | 0 refills | Status: DC
Start: 2016-06-18 — End: 2016-09-17

## 2016-06-30 ENCOUNTER — Encounter (HOSPITAL_COMMUNITY): Payer: Self-pay | Admitting: Emergency Medicine

## 2016-06-30 ENCOUNTER — Emergency Department (HOSPITAL_COMMUNITY)
Admission: EM | Admit: 2016-06-30 | Discharge: 2016-06-30 | Disposition: A | Discharge status: Home / Self Care | Payer: Self-pay | Attending: Emergency Medicine | Admitting: Emergency Medicine

## 2016-06-30 DIAGNOSIS — Z7984 Long term (current) use of oral hypoglycemic drugs: Secondary | ICD-10-CM | POA: Insufficient documentation

## 2016-06-30 DIAGNOSIS — Z79899 Other long term (current) drug therapy: Secondary | ICD-10-CM | POA: Insufficient documentation

## 2016-06-30 DIAGNOSIS — L299 Pruritus, unspecified: Secondary | ICD-10-CM | POA: Insufficient documentation

## 2016-06-30 DIAGNOSIS — I10 Essential (primary) hypertension: Secondary | ICD-10-CM | POA: Insufficient documentation

## 2016-06-30 MED ORDER — TRIAMCINOLONE ACETONIDE 0.1 % EX CREA: 1.0000 "application " | TOPICAL_CREAM | Freq: Two times a day (BID) | CUTANEOUS | 0 refills | Status: DC

## 2016-06-30 MED ORDER — DEXAMETHASONE SODIUM PHOSPHATE 4 MG/ML IJ SOLN
8.0000 mg | Freq: Once | INTRAMUSCULAR | Status: AC
  Administered 2016-06-30: 8 mg via INTRAMUSCULAR
  Filled 2016-06-30: qty 2

## 2016-06-30 MED ORDER — DEXAMETHASONE 4 MG PO TABS: 4.0000 mg | ORAL_TABLET | Freq: Two times a day (BID) | ORAL | 0 refills | Status: DC

## 2016-06-30 MED ORDER — HYDROXYZINE PAMOATE 25 MG PO CAPS: 25.0000 mg | ORAL_CAPSULE | Freq: Four times a day (QID) | ORAL | 1 refills | Status: DC | PRN

## 2016-06-30 NOTE — ED Provider Notes (Signed)
[facility] DEPT Provider Note   CSN: [id number] Arrival date & time: 06/30/16  0814     History   Chief Complaint Chief Complaint  Patient presents with  . Pruritis    HPI Joseph Hogan is a 56 y.o. male.  Patient is a 56 year old male who presents to the emergency department with a complaint of itching.  The patient states that he was in [place] helping a relative, when he later found out that this relative had scabies. When he came back to [place] his children had to be treated for scabies, and one of them for secondary infection. He now has itching and several areas but has not seen a rash. He states the itching is not responding to Benadryl, and Most of the night. No fever or chills reported. No nausea vomiting reported. The patient denies any exposure to any new chemicals, foods, or clothing.      Past Medical History:  Diagnosis Date  . Hypertension   . MRSA (methicillin resistant Staphylococcus aureus)     Patient Active Problem List   Diagnosis Date Noted  . Bilateral knee pain 06/29/2015  . 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 Prediabetes 04/11/2015  . Obesity, unspecified 01/12/2015  . Essential hypertension, benign 01/10/2015    History reviewed. No pertinent surgical history.     Home Medications    Prior to Admission medications   Medication Sig Start Date End Date Taking? Authorizing Provider  amLODipine (NORVASC) 10 MG tablet Take 1 tablet (10 mg total) by mouth daily. 06/18/16   Soyla Dryer, PA-C  Ascorbic Acid (VITAMIN C PO) Take 1 tablet by mouth daily.    [provider]  B Complex Vitamins (VITAMIN B COMPLEX PO) Take 1 tablet by mouth daily.    [provider]  cholecalciferol (VITAMIN D) 1000 units tablet Take 1,000 Units by mouth daily.    [provider]  dexamethasone (DECADRON) 4 MG tablet Take 1 tablet (4 mg total) by mouth 2 (two) times daily with a meal. 06/30/16   Lily Kocher, PA-C    diclofenac (VOLTAREN) 75 MG EC tablet TAKE ONE TABLET BY MOUTH EVERY 12 HOURS AS NEEDED FOR PAIN Patient not taking: Reported on 05/01/2016 03/06/16   Soyla Dryer, PA-C  GARLIC PO Take 1 tablet by mouth daily.    [provider]  hydrochlorothiazide (MICROZIDE) 12.5 MG capsule Take 1 capsule (12.5 mg total) by mouth daily. 06/13/16   Soyla Dryer, PA-C  hydrOXYzine (VISTARIL) 25 MG capsule Take 1 capsule (25 mg total) by mouth every 6 (six) hours as needed for itching. 06/30/16   Lily Kocher, PA-C  ibuprofen (ADVIL,MOTRIN) 200 MG tablet Take 200 mg by mouth every 6 (six) hours as needed.    [provider]  metFORMIN (GLUCOPHAGE-XR) 500 MG 24 hr tablet Take 1 tablet (500 mg total) by mouth daily with breakfast. 06/13/16   Soyla Dryer, PA-C  metoprolol (LOPRESSOR) 50 MG tablet TAKE ONE TABLET BY MOUTH TWICE DAILY 04/15/16   Soyla Dryer, PA-C  Multiple Vitamin (MULTIVITAMIN WITH MINERALS) TABS tablet Take 1 tablet by mouth daily.    [provider]  OVER THE COUNTER MEDICATION Take 1 capsule by mouth daily. BEET ROOT    [provider]  triamcinolone cream (KENALOG) 0.1 % Apply 1 application topically 2 (two) times daily. 06/30/16   Lily Kocher, PA-C  vitamin E 200 UNIT capsule Take 200 Units by mouth daily.    [provider]    Family History Family  History  Problem Relation Age of Onset  . Stroke Mother   . Hypertension Mother   . Heart disease Father   . Alzheimer's disease Father   . Hypertension Brother   . Alcohol abuse Brother   . Cancer Maternal Aunt   . Heart disease Maternal Aunt   . Cancer Maternal Uncle   . Heart disease Maternal Uncle   . Cancer Paternal Aunt   . Heart disease Paternal Aunt   . Cancer Paternal Uncle   . Heart disease Paternal Uncle   . Heart disease Paternal Grandmother   . Alzheimer's disease Paternal Grandfather     Social History Social History  Substance Use Topics  . Smoking status:  Never Smoker  . Smokeless tobacco: Never Used  . Alcohol use No     Allergies   Codeine and Lisinopril   Review of Systems Review of Systems  Constitutional: Negative for activity change.       All ROS Neg except as noted in HPI  HENT: Negative for nosebleeds.   Eyes: Negative for photophobia and discharge.  Respiratory: Negative for cough, shortness of breath and wheezing.   Cardiovascular: Negative for chest pain and palpitations.  Gastrointestinal: Negative for abdominal pain and blood in stool.  Genitourinary: Negative for dysuria, frequency and hematuria.  Musculoskeletal: Negative for arthralgias, back pain and neck pain.  Skin: Negative.        itching  Neurological: Negative for dizziness, seizures and speech difficulty.  Psychiatric/Behavioral: Negative for confusion and hallucinations.     Physical Exam Updated Vital Signs BP (!) 159/80 (BP Location: Right Arm)   Pulse (!) 51   Temp 98.1 F (36.7 C) (Oral)   Resp 18   Ht 5\' 11"  (1.803 m)   Wt 271 lb (122.9 kg)   SpO2 100%   BMI 37.80 kg/m   Physical Exam  Constitutional: He is oriented to person, place, and time. He appears well-developed and well-nourished.  Non-toxic appearance.  HENT:  Head: Normocephalic.  Right Ear: Tympanic membrane and external ear normal.  Left Ear: Tympanic membrane and external ear normal.  Eyes: EOM and lids are normal. Pupils are equal, round, and reactive to light.  Neck: Normal range of motion. Neck supple. Carotid bruit is not present.  Cardiovascular: Normal rate, regular rhythm, normal heart sounds, intact distal pulses and normal pulses.   Pulmonary/Chest: Breath sounds normal. No respiratory distress.  Abdominal: Soft. Bowel sounds are normal. There is no tenderness. There is no guarding.  Musculoskeletal: Normal range of motion.  Lymphadenopathy:       Head (right side): No submandibular adenopathy present.       Head (left side): No submandibular adenopathy  present.    He has no cervical adenopathy.  Neurological: He is alert and oriented to person, place, and time. He has normal strength. No cranial nerve deficit or sensory deficit.  Skin: Skin is warm and dry.  Multiple scratched areas on the upper and lower extremities and abd. No actual rash noted.  Psychiatric: He has a normal mood and affect. His speech is normal.  Nursing note and vitals reviewed.    ED Treatments / Results  Labs (all labs ordered are listed, but only abnormal results are displayed) Labs Reviewed - No data to display  EKG  EKG Interpretation None       Radiology No results found.  Procedures Procedures (including critical care time)  Medications Ordered in ED Medications  dexamethasone (DECADRON) injection 8 mg (8 mg  Intramuscular Given 06/30/16 0900)     Initial Impression / Assessment and Plan / ED Course  I have reviewed the triage vital signs and the nursing notes.  Pertinent labs & imaging results that were available during my care of the patient were reviewed by me and considered in my medical decision making (see chart for details).       Final Clinical Impressions(s) / ED Diagnoses Pt reports exposure to scabies. 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 No actual rash noted.  Pt doing extra cleaning for mites and bed bugs. Rx for vistaril, triamcinolone, and decadron given to the patient. Pt to see Mr. McElroy for additional evaluation if not improving.   Final diagnoses:  Pruritus    New Prescriptions New Prescriptions   DEXAMETHASONE (DECADRON) 4 MG TABLET    Take 1 tablet (4 mg total) by mouth 2 (two) times daily with a meal.   HYDROXYZINE (VISTARIL) 25 MG CAPSULE    Take 1 capsule (25 mg total) by mouth every 6 (six) hours as needed for itching.   TRIAMCINOLONE CREAM (KENALOG) 0.1 %    Apply 1 application topically 2 (two) times daily.     Lily Kocher, PA-C 06/30/16 [id number]    Fredia Sorrow, MD 06/30/16  [phone]

## 2016-06-30 NOTE — Discharge Instructions (Signed)
Please use decadron 2 times daily with food. Use vistaril every 6 hours for itching. This medication may cause drowsiness. Please do not drink, drive, or participate in activity that requires concentration while taking this medication. Use triamcinolone 2 or 3 times daily to itching areas.

## 2016-06-30 NOTE — ED Triage Notes (Signed)
Pt reports being exposed to scabies while at a relatives house.  Itching but no rash.

## 2016-07-17 ENCOUNTER — Ambulatory Visit: Payer: Self-pay | Admitting: Physician Assistant

## 2016-07-18 ENCOUNTER — Ambulatory Visit: Payer: Self-pay | Admitting: Physician Assistant

## 2016-08-07 ENCOUNTER — Encounter (HOSPITAL_COMMUNITY): Payer: Self-pay | Admitting: Emergency Medicine

## 2016-08-07 ENCOUNTER — Emergency Department (HOSPITAL_COMMUNITY)
Admission: EM | Admit: 2016-08-07 | Discharge: 2016-08-07 | Disposition: A | Discharge status: Home Health | Payer: Self-pay | Attending: Emergency Medicine | Admitting: Emergency Medicine

## 2016-08-07 DIAGNOSIS — Z79899 Other long term (current) drug therapy: Secondary | ICD-10-CM | POA: Insufficient documentation

## 2016-08-07 DIAGNOSIS — R21 Rash and other nonspecific skin eruption: Secondary | ICD-10-CM | POA: Insufficient documentation

## 2016-08-07 DIAGNOSIS — I10 Essential (primary) hypertension: Secondary | ICD-10-CM | POA: Insufficient documentation

## 2016-08-07 DIAGNOSIS — R001 Bradycardia, unspecified: Secondary | ICD-10-CM | POA: Insufficient documentation

## 2016-08-07 MED ORDER — TRIAMCINOLONE ACETONIDE 0.1 % EX CREA: 1.0000 "application " | TOPICAL_CREAM | Freq: Two times a day (BID) | CUTANEOUS | 1 refills | Status: DC

## 2016-08-07 MED ORDER — DEXAMETHASONE 4 MG PO TABS: 4.0000 mg | ORAL_TABLET | Freq: Two times a day (BID) | ORAL | 0 refills | Status: DC

## 2016-08-07 MED ORDER — CETIRIZINE HCL 10 MG PO TABS: 10.0000 mg | ORAL_TABLET | Freq: Every day | ORAL | 1 refills | Status: DC

## 2016-08-07 NOTE — ED Provider Notes (Signed)
[facility] DEPT Provider Note   CSN: [id number] Arrival date & time: 08/07/16  1604     History   Chief Complaint Chief Complaint  Patient presents with  . Rash    HPI Joseph Hogan is a 56 y.o. male.  Patient is a 56 year old male who presents to the emergency department with a complaint of a rash.  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 He has been noticing some itching for a few weeks now on. The problem is getting worse, and bothering his ability to rest, particularly at night. The patient states that his apartment complex has been treated by a pest control agency. They have not found any problems with bedbugs. He states that he has sprained his own apartment, and he has not seen any bedbugs. He states that he is concerned for possible scabies. It is of note that the patient had a similar rash approximately a month ago. He states that the medications given only helped for a few days. He has not had any fever or chills to be reported. He presents now for evaluation and assistance.      Past Medical History:  Diagnosis Date  . Hypertension   . MRSA (methicillin resistant Staphylococcus aureus)     Patient Active Problem List   Diagnosis Date Noted  . Bilateral knee pain 06/29/2015  . 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 Prediabetes 04/11/2015  . Obesity, unspecified 01/12/2015  . Essential hypertension, benign 01/10/2015    History reviewed. No pertinent surgical history.     Home Medications    Prior to Admission medications   Medication Sig Start Date End Date Taking? Authorizing Provider  amLODipine (NORVASC) 10 MG tablet Take 1 tablet (10 mg total) by mouth daily. 06/18/16   Soyla Dryer, PA-C  Ascorbic Acid (VITAMIN C PO) Take 1 tablet by mouth daily.    [provider]  B Complex Vitamins (VITAMIN B COMPLEX PO) Take 1 tablet by mouth daily.    [provider]  cholecalciferol (VITAMIN D) 1000 units tablet Take  1,000 Units by mouth daily.    [provider]  dexamethasone (DECADRON) 4 MG tablet Take 1 tablet (4 mg total) by mouth 2 (two) times daily with a meal. 06/30/16   Lily Kocher, PA-C  diclofenac (VOLTAREN) 75 MG EC tablet TAKE ONE TABLET BY MOUTH EVERY 12 HOURS AS NEEDED FOR PAIN Patient not taking: Reported on 05/01/2016 03/06/16   Soyla Dryer, PA-C  GARLIC PO Take 1 tablet by mouth daily.    [provider]  hydrochlorothiazide (MICROZIDE) 12.5 MG capsule Take 1 capsule (12.5 mg total) by mouth daily. 06/13/16   Soyla Dryer, PA-C  hydrOXYzine (VISTARIL) 25 MG capsule Take 1 capsule (25 mg total) by mouth every 6 (six) hours as needed for itching. 06/30/16   Lily Kocher, PA-C  ibuprofen (ADVIL,MOTRIN) 200 MG tablet Take 200 mg by mouth every 6 (six) hours as needed.    [provider]  metFORMIN (GLUCOPHAGE-XR) 500 MG 24 hr tablet Take 1 tablet (500 mg total) by mouth daily with breakfast. 06/13/16   Soyla Dryer, PA-C  metoprolol (LOPRESSOR) 50 MG tablet TAKE ONE TABLET BY MOUTH TWICE DAILY 04/15/16   Soyla Dryer, PA-C  Multiple Vitamin (MULTIVITAMIN WITH MINERALS) TABS tablet Take 1 tablet by mouth daily.    [provider]  OVER THE COUNTER MEDICATION Take 1 capsule by mouth daily. BEET ROOT    [provider]  triamcinolone cream (KENALOG) 0.1 %  Apply 1 application topically 2 (two) times daily. 06/30/16   Lily Kocher, PA-C  vitamin E 200 UNIT capsule Take 200 Units by mouth daily.    [provider]    Family History Family History  Problem Relation Age of Onset  . Stroke Mother   . Hypertension Mother   . Heart disease Father   . Alzheimer's disease Father   . Hypertension Brother   . Alcohol abuse Brother   . Cancer Maternal Aunt   . Heart disease Maternal Aunt   . Cancer Maternal Uncle   . Heart disease Maternal Uncle   . Cancer Paternal Aunt   . Heart disease Paternal Aunt   . Cancer Paternal Uncle   .  Heart disease Paternal Uncle   . Heart disease Paternal Grandmother   . Alzheimer's disease Paternal Grandfather     Social History Social History  Substance Use Topics  . Smoking status: Never Smoker  . Smokeless tobacco: Never Used  . Alcohol use No     Allergies   Codeine and Lisinopril   Review of Systems Review of Systems  Constitutional: Negative for activity change and appetite change.  HENT: Negative for congestion, ear discharge, ear pain, facial swelling, nosebleeds, rhinorrhea, sneezing and tinnitus.   Eyes: Negative for photophobia, pain and discharge.  Respiratory: Negative for cough, choking, shortness of breath and wheezing.   Cardiovascular: Negative for chest pain, palpitations and leg swelling.  Gastrointestinal: Negative for abdominal pain, blood in stool, constipation, diarrhea, nausea and vomiting.  Genitourinary: Negative for difficulty urinating, dysuria, flank pain, frequency and hematuria.  Musculoskeletal: Negative for back pain, gait problem, myalgias and neck pain.  Skin: Positive for rash. Negative for color change and wound.       itching  Neurological: Negative for dizziness, seizures, syncope, facial asymmetry, speech difficulty, weakness and numbness.  Hematological: Negative for adenopathy. Does not bruise/bleed easily.  Psychiatric/Behavioral: Negative for agitation, confusion, hallucinations, self-injury and suicidal ideas. The patient is not nervous/anxious.      Physical Exam Updated Vital Signs BP 134/87   Pulse (!) 49   Temp 98.3 F (36.8 C) (Oral)   Resp 17   SpO2 100%   Physical Exam  Constitutional: He appears well-developed and well-nourished. No distress.  HENT:  Head: Normocephalic and atraumatic.  Right Ear: External ear normal.  Left Ear: External ear normal.  Eyes: Conjunctivae are normal. Right eye exhibits no discharge. Left eye exhibits no discharge. No scleral icterus.  Neck: Neck supple. No tracheal deviation  present.  Cardiovascular: Regular rhythm and intact distal pulses.  Bradycardia present.   Pulmonary/Chest: Effort normal and breath sounds normal. No stridor. No respiratory distress. He has no wheezes. He has no rales.  Abdominal: Soft. Bowel sounds are normal. He exhibits no distension. There is no tenderness. There is no rebound and no guarding.  Musculoskeletal: He exhibits no edema or tenderness.  Neurological: He is alert. He has normal strength. No cranial nerve deficit (no facial droop, extraocular movements intact, no slurred speech) or sensory deficit. He exhibits normal muscle tone. He displays no seizure activity. Coordination normal.  Skin: Skin is warm and dry. No rash noted.  There are dry skin areas at the web spaces of the hands. They're dry skin areas on the outer surface of the elbows and that the anterior portion of the knees.  There are small red raised areas on the arms, the abdomen, and the lower legs. No red streaks appreciated. No drainage appreciated. No  hot areas appreciated.  Psychiatric: He has a normal mood and affect.  Nursing note and vitals reviewed.    ED Treatments / Results  Labs (all labs ordered are listed, but only abnormal results are displayed) Labs Reviewed - No data to display  EKG  EKG Interpretation None       Radiology No results found.  Procedures Procedures (including critical care time)  Medications Ordered in ED Medications - No data to display   Initial Impression / Assessment and Plan / ED Course  I have reviewed the triage vital signs and the nursing notes.  Pertinent labs & imaging results that were available during my care of the patient were reviewed by me and considered in my medical decision making (see chart for details).       Final Clinical Impressions(s) / ED Diagnoses MDM Vital signs reviewed. Patient has a rash at multiple sites. He states that he has sprained his apartment for bedbugs and other mites and  past. He works in an apartment complex and he states that this area has been treated by a he does not recall any new changes in his soaps, or dryer sheets, detergents, etc.  At this point we will treat the patient for his symptoms. Prescription for Zyrtec, Decadron, and triamcinolone cream given to the patient. Patient is referred to dermatology for assistance with this issue.    Final diagnoses:  Rash and nonspecific skin eruption    New Prescriptions Discharge Medication List as of 08/07/2016  5:06 PM    START taking these medications   Details  cetirizine (ZYRTEC) 10 MG tablet Take 1 tablet (10 mg total) by mouth daily., Starting Wed 08/07/2016, Print         Lily Kocher, PA-C 08/07/16 1719    Fredia Sorrow, MD 08/11/16 [phone]

## 2016-08-07 NOTE — Discharge Instructions (Signed)
Please see Mrs. Register or member of her team concerning your dermatology issue soon as possible. Apply triamcinolone to all areas of rash 2 times daily. Use Zyrtec daily for itching. May use Benadryl at bedtime as needed for continued itching. Use Decadron 2 times daily with food.

## 2016-08-07 NOTE — ED Triage Notes (Signed)
Pt cleans hotel rooms for a living. Pt states he thinks he has scabies.l pt has some rashy areas noted to lower legs.

## 2016-09-06 ENCOUNTER — Emergency Department (HOSPITAL_COMMUNITY)
Admission: EM | Admit: 2016-09-06 | Discharge: 2016-09-06 | Disposition: A | Discharge status: Home / Self Care | Payer: Self-pay | Attending: Emergency Medicine | Admitting: Emergency Medicine

## 2016-09-06 ENCOUNTER — Encounter (HOSPITAL_COMMUNITY): Payer: Self-pay | Admitting: *Deleted

## 2016-09-06 DIAGNOSIS — Y929 Unspecified place or not applicable: Secondary | ICD-10-CM | POA: Insufficient documentation

## 2016-09-06 DIAGNOSIS — Z7984 Long term (current) use of oral hypoglycemic drugs: Secondary | ICD-10-CM | POA: Insufficient documentation

## 2016-09-06 DIAGNOSIS — T63461A Toxic effect of venom of wasps, accidental (unintentional), initial encounter: Secondary | ICD-10-CM | POA: Insufficient documentation

## 2016-09-06 DIAGNOSIS — Z79899 Other long term (current) drug therapy: Secondary | ICD-10-CM | POA: Insufficient documentation

## 2016-09-06 DIAGNOSIS — Y999 Unspecified external cause status: Secondary | ICD-10-CM | POA: Insufficient documentation

## 2016-09-06 DIAGNOSIS — L538 Other specified erythematous conditions: Secondary | ICD-10-CM | POA: Insufficient documentation

## 2016-09-06 DIAGNOSIS — I10 Essential (primary) hypertension: Secondary | ICD-10-CM | POA: Insufficient documentation

## 2016-09-06 DIAGNOSIS — Y9389 Activity, other specified: Secondary | ICD-10-CM | POA: Insufficient documentation

## 2016-09-06 MED ORDER — IBUPROFEN 800 MG PO TABS: 800.0000 mg | ORAL_TABLET | Freq: Three times a day (TID) | ORAL | 0 refills | Status: DC

## 2016-09-06 MED ORDER — HYDROCODONE-ACETAMINOPHEN 5-325 MG PO TABS
1.0000 | ORAL_TABLET | Freq: Once | ORAL | Status: AC
  Administered 2016-09-06: 1 via ORAL
  Filled 2016-09-06: qty 1

## 2016-09-06 MED ORDER — HYDROCODONE-ACETAMINOPHEN 5-325 MG PO TABS: 1.0000 | ORAL_TABLET | Freq: Four times a day (QID) | ORAL | 0 refills | Status: DC | PRN

## 2016-09-06 MED ORDER — IBUPROFEN 800 MG PO TABS
800.0000 mg | ORAL_TABLET | Freq: Once | ORAL | Status: AC
  Administered 2016-09-06: 800 mg via ORAL
  Filled 2016-09-06: qty 1

## 2016-09-06 NOTE — Discharge Instructions (Signed)
You may want to try either spray benadryl or [place] anti itch cream (or the stores generic version) for itch relief.  Ice packs can also help with pain and swelling and itching. You may take the hydrocodone prescribed for pain relief.  This will make you drowsy - do not drive within 4 hours of taking this medication.

## 2016-09-06 NOTE — ED Triage Notes (Signed)
Pt reports sticking his hand in a wasp's nest yesterday and had several bites on his right hand and 1 on his neck. Pt reports today he was stung on his right wrist. Pt also c/o headache.

## 2016-09-06 NOTE — ED Notes (Signed)
Pt took 1 25mg  benadryl this morning without relief.

## 2016-09-07 NOTE — ED Provider Notes (Signed)
[facility] DEPT Provider Note   CSN: [id number] Arrival date & time: 09/06/16  [place]     History   Chief Complaint Chief Complaint  Patient presents with  . Insect Bite    HPI Joseph Hogan is a 56 y.o. male presenting with complaint of persistent pain in his right hand after being stung by multiple wasps yesterday.  He reached beneath a shelf, unaware there was a wasp nest hanging, disturbed the insects and sustained at least 4 stings on his right dorsal hand, wrist and and another on his right lateral neck.  He denies sob, cp,  mouth or throat swelling, wheezing or any other respiratory complaint. He has applied ice, used elevation, benadryl but continues to have increased pain.  He obtained little sleep last night due to pain and woke with a generalized headache today. He denies n/v, dizziness, focal weakness.  The history is provided by the patient.    Past Medical History:  Diagnosis Date  . Hypertension   . MRSA (methicillin resistant Staphylococcus aureus)     Patient Active Problem List   Diagnosis Date Noted  . Bilateral knee pain 06/29/2015  . 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 Prediabetes 04/11/2015  . Obesity, unspecified 01/12/2015  . Essential hypertension, benign 01/10/2015    History reviewed. No pertinent surgical history.     Home Medications    Prior to Admission medications   Medication Sig Start Date End Date Taking? Authorizing Provider  amLODipine (NORVASC) 10 MG tablet Take 1 tablet (10 mg total) by mouth daily. 06/18/16   Soyla Dryer, PA-C  Ascorbic Acid (VITAMIN C PO) Take 1 tablet by mouth daily.    [provider]  B Complex Vitamins (VITAMIN B COMPLEX PO) Take 1 tablet by mouth daily.    [provider]  cetirizine (ZYRTEC) 10 MG tablet Take 1 tablet (10 mg total) by mouth daily. 08/07/16   Lily Kocher, PA-C  cholecalciferol (VITAMIN D) 1000 units tablet Take 1,000 Units by mouth daily.    [provider]  dexamethasone (DECADRON) 4 MG tablet Take 1 tablet (4 mg total) by mouth 2 (two) times daily with a meal. 08/07/16   Lily Kocher, PA-C  diclofenac (VOLTAREN) 75 MG EC tablet TAKE ONE TABLET BY MOUTH EVERY 12 HOURS AS NEEDED FOR PAIN Patient not taking: Reported on 05/01/2016 03/06/16   Soyla Dryer, PA-C  GARLIC PO Take 1 tablet by mouth daily.    [provider]  hydrochlorothiazide (MICROZIDE) 12.5 MG capsule Take 1 capsule (12.5 mg total) by mouth daily. 06/13/16   Soyla Dryer, PA-C  HYDROcodone-acetaminophen (NORCO/VICODIN) 5-325 MG tablet Take 1 tablet by mouth every 6 (six) hours as needed for severe pain. 09/06/16   Evalee Jefferson, PA-C  hydrOXYzine (VISTARIL) 25 MG capsule Take 1 capsule (25 mg total) by mouth every 6 (six) hours as needed for itching. 06/30/16   Lily Kocher, PA-C  ibuprofen (ADVIL,MOTRIN) 800 MG tablet Take 1 tablet (800 mg total) by mouth 3 (three) times daily. 09/06/16   Evalee Jefferson, PA-C  metFORMIN (GLUCOPHAGE-XR) 500 MG 24 hr tablet Take 1 tablet (500 mg total) by mouth daily with breakfast. 06/13/16   Soyla Dryer, PA-C  metoprolol (LOPRESSOR) 50 MG tablet TAKE ONE TABLET BY MOUTH TWICE DAILY 04/15/16   Soyla Dryer, PA-C  Multiple Vitamin (MULTIVITAMIN WITH MINERALS) TABS tablet Take 1 tablet by mouth daily.    [provider]  OVER THE COUNTER MEDICATION Take 1 capsule by mouth daily. BEET ROOT  [provider]  triamcinolone cream (KENALOG) 0.1 % Apply 1 application topically 2 (two) times daily. 08/07/16   Lily Kocher, PA-C  vitamin E 200 UNIT capsule Take 200 Units by mouth daily.    [provider]    Family History Family History  Problem Relation Age of Onset  . Stroke Mother   . Hypertension Mother   . Heart disease Father   . Alzheimer's disease Father   . Hypertension Brother   . Alcohol abuse Brother   . Cancer Maternal Aunt   . Heart disease Maternal Aunt   . Cancer Maternal Uncle   . Heart  disease Maternal Uncle   . Cancer Paternal Aunt   . Heart disease Paternal Aunt   . Cancer Paternal Uncle   . Heart disease Paternal Uncle   . Heart disease Paternal Grandmother   . Alzheimer's disease Paternal Grandfather     Social History Social History  Substance Use Topics  . Smoking status: Never Smoker  . Smokeless tobacco: Never Used  . Alcohol use No     Allergies   Codeine and Lisinopril   Review of Systems Review of Systems  Constitutional: Negative for chills and fever.  HENT: Negative.  Negative for facial swelling.   Respiratory: Negative for shortness of breath, wheezing and stridor.   Cardiovascular: Negative for chest pain.  Skin: Positive for color change.  Neurological: Positive for headaches. Negative for weakness and numbness.     Physical Exam Updated Vital Signs BP (!) 179/99   Pulse (!) 54   Temp 98.1 F (36.7 C) (Oral)   Resp 18   Ht 5\' 11"  (1.803 m)   Wt 122.9 kg (271 lb)   SpO2 97%   BMI 37.80 kg/m   Physical Exam  Constitutional: He appears well-developed and well-nourished.  HENT:  Head: Normocephalic and atraumatic.  Eyes: Conjunctivae are normal.  Neck: Normal range of motion.  Cardiovascular: Normal rate, regular rhythm, normal heart sounds and intact distal pulses.   Pulmonary/Chest: Effort normal and breath sounds normal. No stridor. He has no wheezes.  Musculoskeletal: Normal range of motion.  Neurological: He is alert. No cranial nerve deficit or sensory deficit.  Skin: Skin is warm and dry. There is erythema.  Erythema and edema localized to right dorsal hand. No wounds present. Distal sensation intact, less than 2 sec distal cap refill.  Psychiatric: He has a normal mood and affect.  Nursing note and vitals reviewed.    ED Treatments / Results  Labs (all labs ordered are listed, but only abnormal results are displayed) Labs Reviewed - No data to display  EKG  EKG Interpretation None       Radiology No  results found.  Procedures Procedures (including critical care time)  Medications Ordered in ED Medications  ibuprofen (ADVIL,MOTRIN) tablet 800 mg (800 mg Oral Given 09/06/16 2029)  HYDROcodone-acetaminophen (NORCO/VICODIN) 5-325 MG per tablet 1 tablet (1 tablet Oral Given 09/06/16 2029)     Initial Impression / Assessment and Plan / ED Course  I have reviewed the triage vital signs and the nursing notes.  Pertinent labs & imaging results that were available during my care of the patient were reviewed by me and considered in my medical decision making (see chart for details).     Pt advised continued benadryl, but might consider spray benadryl given increased sedation if taken with hydrocodone, ice, elevation. Hydrocodone,dose given here. No signs of anaphylactic reaction. BP is elevated today, discussed need for recheck  bp once pain is improved.  Final Clinical Impressions(s) / ED Diagnoses   Final diagnoses:  Wasp sting, accidental or unintentional, initial encounter    New Prescriptions Discharge Medication List as of 09/06/2016  8:30 PM    START taking these medications   Details  HYDROcodone-acetaminophen (NORCO/VICODIN) 5-325 MG tablet Take 1 tablet by mouth every 6 (six) hours as needed for severe pain., Starting Fri 09/06/2016, Print         Evalee Jefferson, PA-C 09/07/16 [place]    Nat Christen, MD 09/12/16 1344

## 2016-09-17 ENCOUNTER — Other Ambulatory Visit: Payer: Self-pay | Admitting: Physician Assistant

## 2016-11-11 ENCOUNTER — Encounter (HOSPITAL_COMMUNITY): Payer: Self-pay | Admitting: Emergency Medicine

## 2016-11-11 ENCOUNTER — Emergency Department (HOSPITAL_COMMUNITY)
Admission: EM | Admit: 2016-11-11 | Discharge: 2016-11-11 | Disposition: A | Discharge status: Home / Self Care | Payer: Medicaid Other | Attending: Emergency Medicine | Admitting: Emergency Medicine

## 2016-11-11 DIAGNOSIS — K0889 Other specified disorders of teeth and supporting structures: Secondary | ICD-10-CM | POA: Diagnosis present

## 2016-11-11 DIAGNOSIS — K047 Periapical abscess without sinus: Secondary | ICD-10-CM | POA: Insufficient documentation

## 2016-11-11 DIAGNOSIS — I10 Essential (primary) hypertension: Secondary | ICD-10-CM | POA: Insufficient documentation

## 2016-11-11 DIAGNOSIS — Z79899 Other long term (current) drug therapy: Secondary | ICD-10-CM | POA: Insufficient documentation

## 2016-11-11 DIAGNOSIS — Z7984 Long term (current) use of oral hypoglycemic drugs: Secondary | ICD-10-CM | POA: Diagnosis not present

## 2016-11-11 MED ORDER — TRAMADOL HCL 50 MG PO TABS: 50.0000 mg | ORAL_TABLET | Freq: Four times a day (QID) | ORAL | 0 refills | Status: DC | PRN

## 2016-11-11 MED ORDER — TRAMADOL HCL 50 MG PO TABS
50.0000 mg | ORAL_TABLET | Freq: Once | ORAL | Status: AC
  Administered 2016-11-11: 50 mg via ORAL
  Filled 2016-11-11: qty 1

## 2016-11-11 MED ORDER — AMOXICILLIN 500 MG PO CAPS: 500.0000 mg | ORAL_CAPSULE | Freq: Three times a day (TID) | ORAL | 0 refills | Status: DC

## 2016-11-11 MED ORDER — AMOXICILLIN 250 MG PO CAPS
500.0000 mg | ORAL_CAPSULE | Freq: Once | ORAL | Status: AC
  Administered 2016-11-11: 500 mg via ORAL
  Filled 2016-11-11: qty 2

## 2016-11-11 NOTE — ED Triage Notes (Signed)
C/o toothache, right lower x 4 days. Dentist stated he needed an abx before seeing him. Nad. No obvious swelling noted.

## 2016-11-11 NOTE — ED Notes (Signed)
Has not taken bp meds today

## 2016-11-11 NOTE — Discharge Instructions (Signed)
Complete your entire course of antibiotics as prescribed.  You  may use the tramadol for pain relief but do not drive within 4 hours of taking as this will make you drowsy.  Avoid applying heat or ice to this abscess area which can worsen your symptoms.  You may use warm salt water swish and spit treatment or half peroxide and water swish and spit after meals to keep this area clean as discussed.  Call the dentist listed above for further management of your symptoms.

## 2016-11-12 NOTE — ED Provider Notes (Signed)
[facility] DEPT Provider Note   CSN: [id number] Arrival date & time: 11/11/16  1043     History   Chief Complaint Chief Complaint  Patient presents with  . Dental Pain    HPI Joseph Hogan is a 56 y.o. male presenting with a several day history of dental pain and gingival swelling.   The patient has a history of decay in the tooth involved which has recently started to cause increased  pain.  There has been no fevers, chills, nausea or vomiting, also no complaint of difficulty swallowing, although chewing makes pain worse. he is arranging care with Dr. Andree Elk for extraction of this tooth but was advised to come in for antibiotics.  He has used salt water swish and spit tx without relief of symptoms.    .  The history is provided by the patient.    Past Medical History:  Diagnosis Date  . Hypertension   . MRSA (methicillin resistant Staphylococcus aureus)     Patient Active Problem List   Diagnosis Date Noted  . Bilateral knee pain 06/29/2015  . 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 Prediabetes 04/11/2015  . Obesity, unspecified 01/12/2015  . Essential hypertension, benign 01/10/2015    History reviewed. No pertinent surgical history.     Home Medications    Prior to Admission medications   Medication Sig Start Date End Date Taking? Authorizing Provider  amLODipine (NORVASC) 10 MG tablet TAKE 1 TABLET BY MOUTH EVERY DAY 09/17/16   Soyla Dryer, PA-C  amoxicillin (AMOXIL) 500 MG capsule Take 1 capsule (500 mg total) by mouth 3 (three) times daily. 11/11/16   Evalee Jefferson, PA-C  Ascorbic Acid (VITAMIN C PO) Take 1 tablet by mouth daily.    [provider]  B Complex Vitamins (VITAMIN B COMPLEX PO) Take 1 tablet by mouth daily.    [provider]  cetirizine (ZYRTEC) 10 MG tablet Take 1 tablet (10 mg total) by mouth daily. 08/07/16   Lily Kocher, PA-C  cholecalciferol (VITAMIN D) 1000 units tablet Take 1,000 Units by mouth daily.    [provider]  dexamethasone (DECADRON) 4 MG tablet Take 1 tablet (4 mg total) by mouth 2 (two) times daily with a meal. 08/07/16   Lily Kocher, PA-C  diclofenac (VOLTAREN) 75 MG EC tablet TAKE ONE TABLET BY MOUTH EVERY 12 HOURS AS NEEDED FOR PAIN Patient not taking: Reported on 05/01/2016 03/06/16   Soyla Dryer, PA-C  GARLIC PO Take 1 tablet by mouth daily.    [provider]  hydrochlorothiazide (MICROZIDE) 12.5 MG capsule Take 1 capsule (12.5 mg total) by mouth daily. 06/13/16   Soyla Dryer, PA-C  HYDROcodone-acetaminophen (NORCO/VICODIN) 5-325 MG tablet Take 1 tablet by mouth every 6 (six) hours as needed for severe pain. 09/06/16   Evalee Jefferson, PA-C  hydrOXYzine (VISTARIL) 25 MG capsule Take 1 capsule (25 mg total) by mouth every 6 (six) hours as needed for itching. 06/30/16   Lily Kocher, PA-C  ibuprofen (ADVIL,MOTRIN) 800 MG tablet Take 1 tablet (800 mg total) by mouth 3 (three) times daily. 09/06/16   Evalee Jefferson, PA-C  metFORMIN (GLUCOPHAGE-XR) 500 MG 24 hr tablet Take 1 tablet (500 mg total) by mouth daily with breakfast. 06/13/16   Soyla Dryer, PA-C  metoprolol (LOPRESSOR) 50 MG tablet TAKE ONE TABLET BY MOUTH TWICE DAILY 04/15/16   Soyla Dryer, PA-C  Multiple Vitamin (MULTIVITAMIN WITH MINERALS) TABS tablet Take 1 tablet by mouth daily.    [provider]  OVER THE  COUNTER MEDICATION Take 1 capsule by mouth daily. BEET ROOT    [provider]  traMADol (ULTRAM) 50 MG tablet Take 1 tablet (50 mg total) by mouth every 6 (six) hours as needed. 11/11/16   Evalee Jefferson, PA-C  triamcinolone cream (KENALOG) 0.1 % Apply 1 application topically 2 (two) times daily. 08/07/16   Lily Kocher, PA-C  vitamin E 200 UNIT capsule Take 200 Units by mouth daily.    [provider]    Family History Family History  Problem Relation Age of Onset  . Stroke Mother   . Hypertension Mother   . Heart disease Father   . Alzheimer's disease Father   .  Hypertension Brother   . Alcohol abuse Brother   . Cancer Maternal Aunt   . Heart disease Maternal Aunt   . Cancer Maternal Uncle   . Heart disease Maternal Uncle   . Cancer Paternal Aunt   . Heart disease Paternal Aunt   . Cancer Paternal Uncle   . Heart disease Paternal Uncle   . Heart disease Paternal Grandmother   . Alzheimer's disease Paternal Grandfather     Social History Social History  Substance Use Topics  . Smoking status: Never Smoker  . Smokeless tobacco: Never Used  . Alcohol use No     Allergies   Codeine and Lisinopril   Review of Systems Review of Systems  Constitutional: Negative for fever.  HENT: Positive for dental problem. Negative for facial swelling and sore throat.   Respiratory: Negative for shortness of breath.   Musculoskeletal: Negative for neck pain and neck stiffness.     Physical Exam Updated Vital Signs BP (!) 170/89 (BP Location: Left Arm)   Pulse 64   Temp 98 F (36.7 C) (Oral)   Resp 17   Ht 5\' 11"  (1.803 m)   Wt 120.2 kg (265 lb)   SpO2 99%   BMI 36.96 kg/m   Physical Exam  Constitutional: He is oriented to person, place, and time. He appears well-developed and well-nourished. No distress.  HENT:  Head: Normocephalic and atraumatic.  Right Ear: Tympanic membrane and external ear normal.  Left Ear: Tympanic membrane and external ear normal.  Mouth/Throat: Oropharynx is clear and moist and mucous membranes are normal. No oral lesions. No trismus in the jaw. Dental abscesses present.    Eyes: Conjunctivae are normal.  Neck: Normal range of motion. Neck supple.  Cardiovascular: Normal rate and normal heart sounds.   Pulmonary/Chest: Effort normal.  Abdominal: He exhibits no distension.  Musculoskeletal: Normal range of motion.  Lymphadenopathy:    He has no cervical adenopathy.  Neurological: He is alert and oriented to person, place, and time.  Skin: Skin is warm and dry. No erythema.  Psychiatric: He has a normal  mood and affect.     ED Treatments / Results  Labs (all labs ordered are listed, but only abnormal results are displayed) Labs Reviewed - No data to display  EKG  EKG Interpretation None       Radiology No results found.  Procedures Procedures (including critical care time)  Medications Ordered in ED Medications  amoxicillin (AMOXIL) capsule 500 mg (500 mg Oral Given 11/11/16 1212)  traMADol (ULTRAM) tablet 50 mg (50 mg Oral Given 11/11/16 1212)     Initial Impression / Assessment and Plan / ED Course  I have reviewed the triage vital signs and the nursing notes.  Pertinent labs & imaging results that were available during my care of the  patient were reviewed by me and considered in my medical decision making (see chart for details).     No acute findings of drainage abscess pocket, no sig mouth or throat edema, no suggestion of sublingual space infection.  Final Clinical Impressions(s) / ED Diagnoses   Final diagnoses:  Dental abscess    New Prescriptions Discharge Medication List as of 11/11/2016 12:09 PM    START taking these medications   Details  amoxicillin (AMOXIL) 500 MG capsule Take 1 capsule (500 mg total) by mouth 3 (three) times daily., Starting Mon 11/11/2016, Print    traMADol (ULTRAM) 50 MG tablet Take 1 tablet (50 mg total) by mouth every 6 (six) hours as needed., Starting Mon 11/11/2016, Print         Evalee Jefferson, PA-C 11/12/16 1144    Mesner, Corene Cornea, MD 11/12/16 1658

## 2016-11-18 ENCOUNTER — Encounter: Payer: Self-pay | Admitting: Physician Assistant

## 2016-11-18 ENCOUNTER — Ambulatory Visit: Payer: Self-pay | Admitting: Physician Assistant

## 2016-11-18 VITALS — BP 170/110 | HR 64 | Temp 97.7°F

## 2016-11-18 DIAGNOSIS — M170 Bilateral primary osteoarthritis of knee: Secondary | ICD-10-CM

## 2016-11-18 DIAGNOSIS — G8929 Other chronic pain: Secondary | ICD-10-CM

## 2016-11-18 DIAGNOSIS — E119 Type 2 diabetes mellitus without complications: Secondary | ICD-10-CM

## 2016-11-18 DIAGNOSIS — F329 Major depressive disorder, single episode, unspecified: Secondary | ICD-10-CM

## 2016-11-18 DIAGNOSIS — Z9119 Patient's noncompliance with other medical treatment and regimen: Secondary | ICD-10-CM

## 2016-11-18 DIAGNOSIS — I10 Essential (primary) hypertension: Secondary | ICD-10-CM

## 2016-11-18 DIAGNOSIS — M25562 Pain in left knee: Secondary | ICD-10-CM

## 2016-11-18 DIAGNOSIS — M25561 Pain in right knee: Secondary | ICD-10-CM

## 2016-11-18 DIAGNOSIS — F32A Depression, unspecified: Secondary | ICD-10-CM

## 2016-11-18 DIAGNOSIS — Z91199 Patient's noncompliance with other medical treatment and regimen due to unspecified reason: Secondary | ICD-10-CM

## 2016-11-18 MED ORDER — METFORMIN HCL ER 500 MG PO TB24: 500.0000 mg | ORAL_TABLET | Freq: Every day | ORAL | 1 refills | Status: DC

## 2016-11-18 MED ORDER — HYDROCHLOROTHIAZIDE 12.5 MG PO CAPS: 12.5000 mg | ORAL_CAPSULE | Freq: Every day | ORAL | 1 refills | Status: DC

## 2016-11-18 MED ORDER — METOPROLOL TARTRATE 50 MG PO TABS: 50.0000 mg | ORAL_TABLET | Freq: Two times a day (BID) | ORAL | 1 refills | Status: DC

## 2016-11-18 MED ORDER — AMLODIPINE BESYLATE 5 MG PO TABS: 5.0000 mg | ORAL_TABLET | Freq: Every day | ORAL | 1 refills | Status: DC

## 2016-11-18 NOTE — Progress Notes (Signed)
There were no vitals taken for this visit.   Subjective:    Patient ID: Joseph Hogan, male    DOB: 09-Nov-1960, 56 y.o.   MRN: [id number]  HPI: Joseph Hogan is a 56 y.o. male presenting on 11/18/2016 for No chief complaint on file.   HPI   Pt was due for follow up early June but he has cancelled multiple appt since that time.   Pt says he was depressed.  He is no longer working.  He says he was depressed about his knees hurting.   He says depresion not as bad now as it was a few months ago. He didn't call [facility] but he went there in the past.  Pt Went to orthopedist aug 2017 and needs knee replacement.  He [facility] now at 60[facility]   Relevant past medical, surgical, family and social history reviewed and updated as indicated. Interim medical history since our last visit reviewed. Allergies and medications reviewed and updated.  CURRENT MEDS: amoxil  Review of Systems  Constitutional: Negative for appetite change, chills, diaphoresis, fatigue, fever and unexpected weight change.  HENT: Positive for dental problem. Negative for congestion, drooling, ear pain, facial swelling, hearing loss, mouth sores, sneezing, sore throat, trouble swallowing and voice change.   Eyes: Negative for pain, discharge, redness, itching and visual disturbance.  Respiratory: Negative for cough, choking, shortness of breath and wheezing.   Cardiovascular: Negative for chest pain, palpitations and leg swelling.  Gastrointestinal: Negative for abdominal pain, blood in stool, constipation, diarrhea and vomiting.  Endocrine: Negative for cold intolerance, heat intolerance and polydipsia.  Genitourinary: Negative for decreased urine volume, dysuria and hematuria.  Musculoskeletal: Positive for arthralgias and gait problem. Negative for back pain.  Skin: Negative for rash.  Allergic/Immunologic: Negative for environmental allergies.  Neurological: Negative for seizures, syncope, light-headedness and  headaches.  Hematological: Negative for adenopathy.  Psychiatric/Behavioral: Positive for dysphoric mood. Negative for agitation and suicidal ideas. The patient is nervous/anxious.     Per HPI unless specifically indicated above     Objective:    There were no vitals taken for this visit.  Wt Readings from Last 3 Encounters:  11/11/16 265 lb (120.2 kg)  09/06/16 271 lb (122.9 kg)  06/30/16 271 lb (122.9 kg)    Physical Exam  Constitutional: He is oriented to person, place, and time. He appears well-developed and well-nourished.  HENT:  Head: Normocephalic and atraumatic.  Neck: Neck supple.  Cardiovascular: Normal rate and regular rhythm.   Pulmonary/Chest: Effort normal and breath sounds normal. He has no wheezes.  Abdominal: Soft. Bowel sounds are normal. There is no hepatosplenomegaly. There is no tenderness.  Musculoskeletal: He exhibits no edema.  Lymphadenopathy:    He has no cervical adenopathy.  Neurological: He is alert and oriented to person, place, and time.  Skin: Skin is warm and dry.  Psychiatric: He has a normal mood and affect. His behavior is normal.  Vitals reviewed.       Assessment & Plan:    Encounter Diagnoses  Name Primary?  . Essential hypertension, benign Yes  . Diabetes mellitus without complication ([place])   . Personal history of noncompliance with medical treatment, presenting hazards to health   . Depression, unspecified depression type   . Chronic pain of both knees   . Osteoarthritis of both knees, unspecified osteoarthritis type     -counseled pt on need to keep appointments to improve his health -will Re-refer to orthopedist for discuss knee replacement  options. -pt to get Fasting labs tomorrow morning -Pt to call [facility] for depression -renewed Rx -pt will follow up 1 month.  RTO sooner prn

## 2016-11-19 ENCOUNTER — Other Ambulatory Visit (HOSPITAL_COMMUNITY)
Admission: RE | Admit: 2016-11-19 | Discharge: 2016-11-19 | Disposition: A | Discharge status: Home / Self Care | Payer: Self-pay | Source: Ambulatory Visit | Attending: Physician Assistant | Admitting: Physician Assistant

## 2016-11-19 DIAGNOSIS — I10 Essential (primary) hypertension: Secondary | ICD-10-CM | POA: Insufficient documentation

## 2016-11-19 DIAGNOSIS — E119 Type 2 diabetes mellitus without complications: Secondary | ICD-10-CM | POA: Insufficient documentation

## 2016-11-19 LAB — COMPREHENSIVE METABOLIC PANEL
ALK PHOS: 53 U/L (ref 38–126)
ALT: 36 U/L (ref 17–63)
AST: 24 U/L (ref 15–41)
Albumin: 4.4 g/dL (ref 3.5–5.0)
Anion gap: 9 (ref 5–15)
BILIRUBIN TOTAL: 0.6 mg/dL (ref 0.3–1.2)
BUN: 17 mg/dL (ref 6–20)
CALCIUM: 8.9 mg/dL (ref 8.9–10.3)
CO2: 25 mmol/L (ref 22–32)
CREATININE: 0.78 mg/dL (ref 0.61–1.24)
Chloride: 102 mmol/L (ref 101–111)
Glucose, Bld: 158 mg/dL — ABNORMAL HIGH (ref 65–99)
Potassium: 4 mmol/L (ref 3.5–5.1)
Sodium: 136 mmol/L (ref 135–145)
Total Protein: 7.5 g/dL (ref 6.5–8.1)

## 2016-11-19 LAB — LIPID PANEL
CHOLESTEROL: 195 mg/dL (ref 0–200)
HDL: 36 mg/dL — ABNORMAL LOW (ref >40)
LDL Cholesterol: 130 mg/dL — ABNORMAL HIGH (ref 0–99)
Total CHOL/HDL Ratio: 5.4 RATIO
Triglycerides: 145 mg/dL (ref <150)
VLDL: 29 mg/dL (ref 0–40)

## 2016-11-19 LAB — HEMOGLOBIN A1C
Hgb A1c MFr Bld: 6.4 % — ABNORMAL HIGH (ref 4.8–5.6)
Mean Plasma Glucose: 136.98 mg/dL

## 2016-11-25 ENCOUNTER — Ambulatory Visit: Payer: Self-pay | Admitting: Physician Assistant

## 2016-12-11 ENCOUNTER — Ambulatory Visit: Payer: Self-pay | Admitting: Orthopedic Surgery

## 2016-12-23 ENCOUNTER — Encounter: Payer: Self-pay | Admitting: Physician Assistant

## 2016-12-23 ENCOUNTER — Ambulatory Visit: Payer: Self-pay | Admitting: Physician Assistant

## 2016-12-23 VITALS — BP 126/80 | HR 47 | Temp 97.7°F | Ht 69.0 in | Wt 274.8 lb

## 2016-12-23 DIAGNOSIS — M170 Bilateral primary osteoarthritis of knee: Secondary | ICD-10-CM

## 2016-12-23 DIAGNOSIS — R001 Bradycardia, unspecified: Secondary | ICD-10-CM

## 2016-12-23 DIAGNOSIS — E785 Hyperlipidemia, unspecified: Secondary | ICD-10-CM | POA: Insufficient documentation

## 2016-12-23 DIAGNOSIS — E119 Type 2 diabetes mellitus without complications: Secondary | ICD-10-CM

## 2016-12-23 DIAGNOSIS — F419 Anxiety disorder, unspecified: Secondary | ICD-10-CM

## 2016-12-23 DIAGNOSIS — I10 Essential (primary) hypertension: Secondary | ICD-10-CM

## 2016-12-23 MED ORDER — METOPROLOL TARTRATE 25 MG PO TABS: 25.0000 mg | ORAL_TABLET | Freq: Two times a day (BID) | ORAL | 1 refills | Status: DC

## 2016-12-23 MED ORDER — AMLODIPINE BESYLATE 10 MG PO TABS: 10.0000 mg | ORAL_TABLET | Freq: Every day | ORAL | 1 refills | Status: DC

## 2016-12-23 MED ORDER — SIMVASTATIN 20 MG PO TABS: 20.0000 mg | ORAL_TABLET | Freq: Every day | ORAL | 4 refills | Status: DC

## 2016-12-23 NOTE — Progress Notes (Signed)
BP 126/80 (BP Location: Left Arm, Patient Position: Sitting, Cuff Size: Normal)   Pulse (!) 47   Temp 97.7 F (36.5 C)   Ht 5\' 9"  (1.753 m)   Wt 274 lb 12 oz (124.6 kg)   SpO2 95%   BMI 40.57 kg/m    Subjective:    Patient ID: Joseph Hogan, male    DOB: [birth year], 56 y.o.   MRN: [id number]  HPI: Joseph Hogan is a 56 y.o. male presenting on 12/23/2016 for Follow-up   HPI  Pt is having more anxiety now than depression.  He did not call [facility] but is planning to.   He feels tired and other than that just pains in the legs/knees.   Pt has [facility] 75%.  He has appt with orthopedics on Wednesday.   Relevant past medical, surgical, family and social history reviewed and updated as indicated. Interim medical history since our last visit reviewed. Allergies and medications reviewed and updated.   Current Outpatient Medications:  .  amLODipine (NORVASC) 5 MG tablet, Take 1 tablet (5 mg total) by mouth daily., Disp: 30 tablet, Rfl: 1 .  cholecalciferol (VITAMIN D) 1000 units tablet, Take 1,000 Units by mouth daily., Disp: , Rfl:  .  hydrochlorothiazide (MICROZIDE) 12.5 MG capsule, Take 1 capsule (12.5 mg total) by mouth daily., Disp: 30 capsule, Rfl: 1 .  ibuprofen (ADVIL,MOTRIN) 800 MG tablet, Take 1 tablet (800 mg total) by mouth 3 (three) times daily., Disp: 21 tablet, Rfl: 0 .  metFORMIN (GLUCOPHAGE-XR) 500 MG 24 hr tablet, Take 1 tablet (500 mg total) by mouth daily with breakfast., Disp: 30 tablet, Rfl: 1 .  metoprolol tartrate (LOPRESSOR) 50 MG tablet, Take 1 tablet (50 mg total) by mouth 2 (two) times daily., Disp: 60 tablet, Rfl: 1 .  Multiple Vitamin (MULTIVITAMIN WITH MINERALS) TABS tablet, Take 1 tablet by mouth daily., Disp: , Rfl:    Review of Systems  Constitutional: Negative for appetite change, chills, diaphoresis, fatigue, fever and unexpected weight change.  HENT: Negative for congestion, dental problem, drooling, ear pain, facial swelling, hearing loss,  mouth sores, sneezing, sore throat, trouble swallowing and voice change.   Eyes: Negative for pain, discharge, redness, itching and visual disturbance.  Respiratory: Negative for cough, choking, shortness of breath and wheezing.   Cardiovascular: Negative for chest pain, palpitations and leg swelling.  Gastrointestinal: Negative for abdominal pain, blood in stool, constipation, diarrhea and vomiting.  Endocrine: Negative for cold intolerance, heat intolerance and polydipsia.  Genitourinary: Negative for decreased urine volume, dysuria and hematuria.  Musculoskeletal: Positive for arthralgias and gait problem. Negative for back pain.  Skin: Negative for rash.  Allergic/Immunologic: Negative for environmental allergies.  Neurological: Negative for seizures, syncope, light-headedness and headaches.  Hematological: Negative for adenopathy.  Psychiatric/Behavioral: Negative for agitation, dysphoric mood and suicidal ideas. The patient is nervous/anxious.     Per HPI unless specifically indicated above     Objective:    BP 126/80 (BP Location: Left Arm, Patient Position: Sitting, Cuff Size: Normal)   Pulse (!) 47   Temp 97.7 F (36.5 C)   Ht 5\' 9"  (1.753 m)   Wt 274 lb 12 oz (124.6 kg)   SpO2 95%   BMI 40.57 kg/m   Wt Readings from Last 3 Encounters:  12/23/16 274 lb 12 oz (124.6 kg)  11/11/16 265 lb (120.2 kg)  09/06/16 271 lb (122.9 kg)    Physical Exam  Constitutional: He is oriented to person, place, and time.  He appears well-developed and well-nourished.  HENT:  Head: Normocephalic and atraumatic.  Neck: Neck supple.  Cardiovascular: Normal rate and regular rhythm.  Pulmonary/Chest: Effort normal and breath sounds normal. He has no wheezes.  Abdominal: Soft. Bowel sounds are normal. There is no hepatosplenomegaly. There is no tenderness.  Musculoskeletal: He exhibits no edema.  Lymphadenopathy:    He has no cervical adenopathy.  Neurological: He is alert and oriented to  person, place, and time.  Skin: Skin is warm and dry.  Psychiatric: He has a normal mood and affect. His behavior is normal.  Vitals reviewed.   Results for orders placed or performed during the hospital encounter of 11/19/16  Hemoglobin A1c  Result Value Ref Range   Hgb A1c MFr Bld 6.4 (H) 4.8 - 5.6 %   Mean Plasma Glucose 136.98 mg/dL  Comprehensive metabolic panel  Result Value Ref Range   Sodium 136 135 - 145 mmol/L   Potassium 4.0 3.5 - 5.1 mmol/L   Chloride 102 101 - 111 mmol/L   CO2 25 22 - 32 mmol/L   Glucose, Bld 158 (H) 65 - 99 mg/dL   BUN 17 6 - 20 mg/dL   Creatinine, Ser 0.78 0.61 - 1.24 mg/dL   Calcium 8.9 8.9 - 10.3 mg/dL   Total Protein 7.5 6.5 - 8.1 g/dL   Albumin 4.4 3.5 - 5.0 g/dL   AST 24 15 - 41 U/L   ALT 36 17 - 63 U/L   Alkaline Phosphatase 53 38 - 126 U/L   Total Bilirubin 0.6 0.3 - 1.2 mg/dL   GFR calc non Af Amer >60 >60 mL/min   GFR calc Af Amer >60 >60 mL/min   Anion gap 9 5 - 15  Lipid panel  Result Value Ref Range   Cholesterol 195 0 - 200 mg/dL   Triglycerides 145 <150 mg/dL   HDL 36 (L) >40 mg/dL   Total CHOL/HDL Ratio 5.4 RATIO   VLDL 29 0 - 40 mg/dL   LDL Cholesterol 130 (H) 0 - 99 mg/dL      Assessment & Plan:   Encounter Diagnoses  Name Primary?  . Essential hypertension, benign Yes  . Diabetes mellitus without complication ([place])   . Hyperlipidemia, unspecified hyperlipidemia type   . Anxiety   . Bradycardia   . Osteoarthritis of both knees, unspecified osteoarthritis type   . Morbid obesity, unspecified obesity type ([place])     -reviewed labs with pt -will reduce metoprolol to 25mg  bid and increase amlodipine to 10mg  due to bradycardia. -rx simvastatin for lipids and counseled on low-fat diet -pt urged to go to orthopedics on Wednesday -encouraged pt to contact [facility] for counseling -pt to follow up 1 month to recheck bp and pulse on new medication regimine.  RTO sooner prn

## 2016-12-25 ENCOUNTER — Other Ambulatory Visit (HOSPITAL_COMMUNITY)
Admission: RE | Admit: 2016-12-25 | Discharge: 2016-12-25 | Disposition: A | Discharge status: Home / Self Care | Payer: Self-pay | Source: Ambulatory Visit | Attending: Physician Assistant | Admitting: Physician Assistant

## 2016-12-25 ENCOUNTER — Ambulatory Visit: Payer: Self-pay | Admitting: Orthopedic Surgery

## 2016-12-25 ENCOUNTER — Inpatient Hospital Stay (HOSPITAL_COMMUNITY): Admit: 2016-12-25 | Payer: Self-pay

## 2016-12-25 DIAGNOSIS — R69 Illness, unspecified: Secondary | ICD-10-CM | POA: Insufficient documentation

## 2016-12-26 LAB — MICROALBUMIN, URINE: MICROALB UR: 95.8 ug/mL — AB

## 2017-01-03 ENCOUNTER — Other Ambulatory Visit: Payer: Self-pay

## 2017-01-03 ENCOUNTER — Emergency Department (HOSPITAL_COMMUNITY)
Admission: EM | Admit: 2017-01-03 | Discharge: 2017-01-03 | Disposition: A | Discharge status: Home / Self Care | Payer: Medicaid Other | Attending: Emergency Medicine | Admitting: Emergency Medicine

## 2017-01-03 ENCOUNTER — Encounter (HOSPITAL_COMMUNITY): Payer: Self-pay | Admitting: Emergency Medicine

## 2017-01-03 DIAGNOSIS — K0889 Other specified disorders of teeth and supporting structures: Secondary | ICD-10-CM

## 2017-01-03 DIAGNOSIS — Z7984 Long term (current) use of oral hypoglycemic drugs: Secondary | ICD-10-CM | POA: Insufficient documentation

## 2017-01-03 DIAGNOSIS — Z79899 Other long term (current) drug therapy: Secondary | ICD-10-CM | POA: Insufficient documentation

## 2017-01-03 DIAGNOSIS — I10 Essential (primary) hypertension: Secondary | ICD-10-CM | POA: Insufficient documentation

## 2017-01-03 DIAGNOSIS — E119 Type 2 diabetes mellitus without complications: Secondary | ICD-10-CM | POA: Insufficient documentation

## 2017-01-03 MED ORDER — CLINDAMYCIN HCL 150 MG PO CAPS
300.0000 mg | ORAL_CAPSULE | Freq: Once | ORAL | Status: AC
  Administered 2017-01-03: 300 mg via ORAL
  Filled 2017-01-03: qty 2

## 2017-01-03 MED ORDER — CLINDAMYCIN HCL 150 MG PO CAPS: 300.0000 mg | ORAL_CAPSULE | Freq: Four times a day (QID) | ORAL | 0 refills | Status: DC

## 2017-01-03 MED ORDER — TRAMADOL HCL 50 MG PO TABS
50.0000 mg | ORAL_TABLET | Freq: Once | ORAL | Status: AC
  Administered 2017-01-03: 50 mg via ORAL
  Filled 2017-01-03: qty 1

## 2017-01-03 MED ORDER — TRAMADOL HCL 50 MG PO TABS: 50.0000 mg | ORAL_TABLET | Freq: Four times a day (QID) | ORAL | 0 refills | Status: DC | PRN

## 2017-01-03 NOTE — Discharge Instructions (Signed)
Follow up with your dentist as soon as possible.

## 2017-01-03 NOTE — ED Triage Notes (Signed)
Pt c/o right side dental pain x 4 days. Nad.

## 2017-01-03 NOTE — ED Provider Notes (Signed)
[facility] EMERGENCY DEPARTMENT Provider Note   CSN: [id number] Arrival date & time: 01/03/17  1717     History   Chief Complaint Chief Complaint  Patient presents with  . Dental Pain    HPI Joseph Hogan is a 56 y.o. male.  HPI  Joseph Hogan is a 56 y.o. male who presents to the Emergency Department complaining of dental pain for 4 days.  Describes a throbbing, constant pain to his right lower teeth.  Pain radiates to his chin.  Pain worse with chewing.  Denies facial swelling, fever, neck pain, difficulty swallowing or breathing.  Has tried OTC pain relievers without relief.   Past Medical History:  Diagnosis Date  . Hypertension   . MRSA (methicillin resistant Staphylococcus aureus)     Patient Active Problem List   Diagnosis Date Noted  . Hyperlipidemia 12/23/2016  . Bilateral knee pain 06/29/2015  . 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 Diabetes mellitus without complication ([place]) 99/24/2683  . Morbid obesity ([place]) 01/12/2015  . Essential hypertension, benign 01/10/2015    History reviewed. No pertinent surgical history.     Home Medications    Prior to Admission medications   Medication Sig Start Date End Date Taking? Authorizing Provider  amLODipine (NORVASC) 10 MG tablet Take 1 tablet (10 mg total) daily by mouth. 12/23/16   Soyla Dryer, PA-C  cholecalciferol (VITAMIN D) 1000 units tablet Take 1,000 Units by mouth daily.    [provider]  hydrochlorothiazide (MICROZIDE) 12.5 MG capsule Take 1 capsule (12.5 mg total) by mouth daily. 11/18/16   Soyla Dryer, PA-C  ibuprofen (ADVIL,MOTRIN) 800 MG tablet Take 1 tablet (800 mg total) by mouth 3 (three) times daily. 09/06/16   Evalee Jefferson, PA-C  metFORMIN (GLUCOPHAGE-XR) 500 MG 24 hr tablet Take 1 tablet (500 mg total) by mouth daily with breakfast. 11/18/16   Soyla Dryer, PA-C  metoprolol tartrate (LOPRESSOR) 25 MG tablet Take 1 tablet (25 mg total) 2 (two) times daily by mouth.  12/23/16   Soyla Dryer, PA-C  Multiple Vitamin (MULTIVITAMIN WITH MINERALS) TABS tablet Take 1 tablet by mouth daily.    [provider]  simvastatin (ZOCOR) 20 MG tablet Take 1 tablet (20 mg total) at bedtime by mouth. 12/23/16   Soyla Dryer, PA-C    Family History Family History  Problem Relation Age of Onset  . Stroke Mother   . Hypertension Mother   . Heart disease Father   . Alzheimer's disease Father   . Hypertension Brother   . Alcohol abuse Brother   . Cancer Maternal Aunt   . Heart disease Maternal Aunt   . Cancer Maternal Uncle   . Heart disease Maternal Uncle   . Cancer Paternal Aunt   . Heart disease Paternal Aunt   . Cancer Paternal Uncle   . Heart disease Paternal Uncle   . Heart disease Paternal Grandmother   . Alzheimer's disease Paternal Grandfather     Social History Social History   Tobacco Use  . Smoking status: Never Smoker  . Smokeless tobacco: Never Used  Substance Use Topics  . Alcohol use: No  . Drug use: No     Allergies   Codeine and Lisinopril   Review of Systems Review of Systems  Constitutional: Negative for appetite change and fever.  HENT: Positive for dental problem. Negative for congestion, facial swelling, sore throat and trouble swallowing.   Eyes: Negative for pain and visual disturbance.  Musculoskeletal: Negative for neck pain and neck stiffness.  Neurological: Negative for dizziness, facial asymmetry and headaches.  Hematological: Negative for adenopathy.  All other systems reviewed and are negative.    Physical Exam Updated Vital Signs BP (!) 150/83 (BP Location: Right Arm)   Pulse 74   Temp 98.4 F (36.9 C) (Oral)   Resp 18   Ht 5\' 11"  (1.803 m)   Wt 119.7 kg (264 lb)   SpO2 97%   BMI 36.82 kg/m   Physical Exam  Constitutional: He is oriented to person, place, and time. He appears well-developed and well-nourished. No distress.  HENT:  Head: Normocephalic and atraumatic.  Right Ear:  Tympanic membrane and ear canal normal.  Left Ear: Tympanic membrane and ear canal normal.  Mouth/Throat: Uvula is midline, oropharynx is clear and moist and mucous membranes are normal. No trismus in the jaw. Dental caries present. No dental abscesses or uvula swelling.  ttp and dental caries of the right lower first molar.  No facial swelling, obvious dental abscess, trismus, or sublingual abnml.    Neck: Normal range of motion. Neck supple.  Cardiovascular: Normal rate, regular rhythm and normal heart sounds.  No murmur heard. Pulmonary/Chest: Effort normal and breath sounds normal.  Musculoskeletal: Normal range of motion.  Lymphadenopathy:    He has no cervical adenopathy.  Neurological: He is alert and oriented to person, place, and time. He exhibits normal muscle tone. Coordination normal.  Skin: Skin is warm and dry.  Nursing note and vitals reviewed.    ED Treatments / Results  Labs (all labs ordered are listed, but only abnormal results are displayed) Labs Reviewed - No data to display  EKG  EKG Interpretation None       Radiology No results found.  Procedures Procedures (including critical care time)  Medications Ordered in ED Medications - No data to display   Initial Impression / Assessment and Plan / ED Course  I have reviewed the triage vital signs and the nursing notes.  Pertinent labs & imaging results that were available during my care of the patient were reviewed by me and considered in my medical decision making (see chart for details).     Pt with recurrent dental pain.  No concerning sx's for dental abscess or Ludwig's angina.  Pt agrees to arrange dental f/u  Final Clinical Impressions(s) / ED Diagnoses   Final diagnoses:  Pain, dental    ED Discharge Orders    None       Bufford Lope 01/05/17 2224    Isla Pence, MD 01/05/17 2306

## 2017-01-22 ENCOUNTER — Ambulatory Visit: Payer: Self-pay | Admitting: Physician Assistant

## 2017-01-27 ENCOUNTER — Encounter: Payer: Self-pay | Admitting: Physician Assistant

## 2017-02-25 ENCOUNTER — Other Ambulatory Visit: Payer: Self-pay | Admitting: Physician Assistant

## 2017-05-20 ENCOUNTER — Encounter (HOSPITAL_COMMUNITY): Payer: Self-pay | Admitting: *Deleted

## 2017-05-20 ENCOUNTER — Other Ambulatory Visit: Payer: Self-pay

## 2017-05-20 ENCOUNTER — Emergency Department (HOSPITAL_COMMUNITY)
Admission: EM | Admit: 2017-05-20 | Discharge: 2017-05-20 | Disposition: A | Discharge status: Home / Self Care | Payer: Medicaid Other | Attending: Emergency Medicine | Admitting: Emergency Medicine

## 2017-05-20 DIAGNOSIS — Z7984 Long term (current) use of oral hypoglycemic drugs: Secondary | ICD-10-CM | POA: Diagnosis not present

## 2017-05-20 DIAGNOSIS — K0889 Other specified disorders of teeth and supporting structures: Secondary | ICD-10-CM | POA: Diagnosis not present

## 2017-05-20 DIAGNOSIS — E119 Type 2 diabetes mellitus without complications: Secondary | ICD-10-CM | POA: Diagnosis not present

## 2017-05-20 DIAGNOSIS — I10 Essential (primary) hypertension: Secondary | ICD-10-CM | POA: Diagnosis not present

## 2017-05-20 DIAGNOSIS — Z79899 Other long term (current) drug therapy: Secondary | ICD-10-CM | POA: Diagnosis not present

## 2017-05-20 HISTORY — DX: Prediabetes: R73.03

## 2017-05-20 MED ORDER — CLINDAMYCIN HCL 150 MG PO CAPS: 300.0000 mg | ORAL_CAPSULE | Freq: Four times a day (QID) | ORAL | 0 refills | Status: DC

## 2017-05-20 MED ORDER — TRAMADOL HCL 50 MG PO TABS: 50.0000 mg | ORAL_TABLET | Freq: Four times a day (QID) | ORAL | 0 refills | Status: DC | PRN

## 2017-05-20 NOTE — Discharge Instructions (Addendum)
Follow-up with your dentist soon.

## 2017-05-20 NOTE — ED Provider Notes (Signed)
[facility] EMERGENCY DEPARTMENT Provider Note   CSN: [id number] Arrival date & time: 05/20/17  1453     History   Chief Complaint Chief Complaint  Patient presents with  . Dental Pain    HPI GARRIE WOODIN is a 57 y.o. male.  HPI   RAYDAN SCHLABACH is a 57 y.o. male who presents to the Emergency Department complaining of left lower dental pain for 1 week.  States that he noticed mild swelling of his left lower face that began several days ago.  He describes pain that is associated with chewing.  Pain radiates from his left jaw into his ear.  He states that he contacted his dentist but cannot get an appointment until the end of the month.  He denies fever, chills, neck pain or difficulty swallowing.  He is tried multiple over-the-counter remedies and Tylenol and ibuprofen without relief.   Past Medical History:  Diagnosis Date  . Hypertension   . MRSA (methicillin resistant Staphylococcus aureus)   . Pre-diabetes     Patient Active Problem List   Diagnosis Date Noted  . Hyperlipidemia 12/23/2016  . Bilateral knee pain 06/29/2015  . 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 Diabetes mellitus without complication ([place]) 26/71/2458  . Morbid obesity ([place]) 01/12/2015  . Essential hypertension, benign 01/10/2015    History reviewed. No pertinent surgical history.      Home Medications    Prior to Admission medications   Medication Sig Start Date End Date Taking? Authorizing Provider  amLODipine (NORVASC) 10 MG tablet TAKE 1 TABLET BY MOUTH EVERY DAY 02/25/17   Soyla Dryer, PA-C  cholecalciferol (VITAMIN D) 1000 units tablet Take 1,000 Units by mouth daily.    [provider]  clindamycin (CLEOCIN) 150 MG capsule Take 2 capsules (300 mg total) by mouth 4 (four) times daily. 01/03/17   Atonya Templer, PA-C  hydrochlorothiazide (MICROZIDE) 12.5 MG capsule Take 1 capsule (12.5 mg total) by mouth daily. 11/18/16   Soyla Dryer, PA-C  ibuprofen (ADVIL,MOTRIN) 800 MG  tablet Take 1 tablet (800 mg total) by mouth 3 (three) times daily. 09/06/16   Evalee Jefferson, PA-C  metFORMIN (GLUCOPHAGE-XR) 500 MG 24 hr tablet Take 1 tablet (500 mg total) by mouth daily with breakfast. 11/18/16   Soyla Dryer, PA-C  metoprolol tartrate (LOPRESSOR) 25 MG tablet Take 1 tablet (25 mg total) 2 (two) times daily by mouth. 12/23/16   Soyla Dryer, PA-C  Multiple Vitamin (MULTIVITAMIN WITH MINERALS) TABS tablet Take 1 tablet by mouth daily.    [provider]  simvastatin (ZOCOR) 20 MG tablet Take 1 tablet (20 mg total) at bedtime by mouth. 12/23/16   Soyla Dryer, PA-C  traMADol (ULTRAM) 50 MG tablet Take 1 tablet (50 mg total) by mouth every 6 (six) hours as needed. 01/03/17   Kem Parkinson, PA-C    Family History Family History  Problem Relation Age of Onset  . Stroke Mother   . Hypertension Mother   . Heart disease Father   . Alzheimer's disease Father   . Hypertension Brother   . Alcohol abuse Brother   . Cancer Maternal Aunt   . Heart disease Maternal Aunt   . Cancer Maternal Uncle   . Heart disease Maternal Uncle   . Cancer Paternal Aunt   . Heart disease Paternal Aunt   . Cancer Paternal Uncle   . Heart disease Paternal Uncle   . Heart disease Paternal Grandmother   . Alzheimer's disease Paternal Grandfather     Social  History Social History   Tobacco Use  . Smoking status: Never Smoker  . Smokeless tobacco: Never Used  Substance Use Topics  . Alcohol use: No  . Drug use: No     Allergies   Codeine and Lisinopril   Review of Systems Review of Systems  Constitutional: Negative for appetite change and fever.  HENT: Positive for dental problem and facial swelling. Negative for congestion, sore throat and trouble swallowing.   Eyes: Negative for pain and visual disturbance.  Musculoskeletal: Negative for neck pain and neck stiffness.  Neurological: Negative for dizziness, facial asymmetry and headaches.  Hematological: Negative  for adenopathy.  All other systems reviewed and are negative.    Physical Exam Updated Vital Signs BP (!) 158/89 (BP Location: Right Arm)   Pulse 64   Temp 99.5 F (37.5 C) (Oral)   Resp 18   Ht 5\' 11"  (1.803 m)   Wt 115.7 kg (255 lb)   SpO2 96%   BMI 35.57 kg/m   Physical Exam  Constitutional: He is oriented to person, place, and time. He appears well-developed and well-nourished. No distress.  HENT:  Head: Normocephalic and atraumatic.  Right Ear: Tympanic membrane and ear canal normal.  Left Ear: Tympanic membrane and ear canal normal.  Mouth/Throat: Uvula is midline, oropharynx is clear and moist and mucous membranes are normal. No trismus in the jaw. Dental caries present. No dental abscesses or uvula swelling.  ttp and dental caries of the left lower first molar.  No facial swelling, obvious dental abscess, trismus, or sublingual abnml.    Neck: Normal range of motion. Neck supple.  Cardiovascular: Normal rate, regular rhythm and normal heart sounds.  No murmur heard. Pulmonary/Chest: Effort normal and breath sounds normal.  Musculoskeletal: Normal range of motion.  Lymphadenopathy:    He has no cervical adenopathy.  Neurological: He is alert and oriented to person, place, and time. He exhibits normal muscle tone. Coordination normal.  Skin: Skin is warm and dry.  Nursing note and vitals reviewed.    ED Treatments / Results  Labs (all labs ordered are listed, but only abnormal results are displayed) Labs Reviewed - No data to display  EKG None  Radiology No results found.  Procedures Procedures (including critical care time)  Medications Ordered in ED Medications - No data to display   Initial Impression / Assessment and Plan / ED Course  I have reviewed the triage vital signs and the nursing notes.  Pertinent labs & imaging results that were available during my care of the patient were reviewed by me and considered in my medical decision making (see  chart for details).     Controlled Substance Prescriptions [place] Controlled Substance Registry consulted? Yes.  No recent rx's on file.    Patient with left lower dental pain without obvious signs of abscess.  There is mild localized swelling along the mid left lower jaw.  Patient is otherwise well-appearing.  No concerning symptoms for Ludwig's angina.  He agrees to treatment plan with warm salt water rinses, prescription for clindamycin and tramadol for pain.  He agrees to keep his upcoming dental appointment.  Final Clinical Impressions(s) / ED Diagnoses   Final diagnoses:  Pain, dental    ED Discharge Orders    None       Kem Parkinson, PA-C 05/20/17 1731    Orlie Dakin, MD 05/20/17 2334

## 2017-05-20 NOTE — ED Triage Notes (Signed)
Pt c/o left lower dental pain that started last week. Pt has dentist appt at the end of the month. Denies fever.

## 2017-07-04 ENCOUNTER — Encounter: Payer: Self-pay | Admitting: Orthopedic Surgery

## 2017-07-28 ENCOUNTER — Other Ambulatory Visit: Payer: Self-pay | Admitting: Physician Assistant

## 2019-10-13 ENCOUNTER — Emergency Department (HOSPITAL_COMMUNITY)
Admission: EM | Admit: 2019-10-13 | Discharge: 2019-10-13 | Disposition: A | Discharge status: Home / Self Care | Payer: Medicare Other | Attending: Emergency Medicine | Admitting: Emergency Medicine

## 2019-10-13 ENCOUNTER — Other Ambulatory Visit: Payer: Self-pay

## 2019-10-13 ENCOUNTER — Encounter (HOSPITAL_COMMUNITY): Payer: Self-pay

## 2019-10-13 ENCOUNTER — Emergency Department (HOSPITAL_COMMUNITY): Payer: Medicare Other

## 2019-10-13 DIAGNOSIS — R0789 Other chest pain: Secondary | ICD-10-CM

## 2019-10-13 DIAGNOSIS — E119 Type 2 diabetes mellitus without complications: Secondary | ICD-10-CM | POA: Insufficient documentation

## 2019-10-13 DIAGNOSIS — Z7984 Long term (current) use of oral hypoglycemic drugs: Secondary | ICD-10-CM | POA: Insufficient documentation

## 2019-10-13 DIAGNOSIS — Z79899 Other long term (current) drug therapy: Secondary | ICD-10-CM | POA: Diagnosis not present

## 2019-10-13 DIAGNOSIS — I10 Essential (primary) hypertension: Secondary | ICD-10-CM | POA: Insufficient documentation

## 2019-10-13 LAB — BASIC METABOLIC PANEL
Anion gap: 11 (ref 5–15)
BUN: 17 mg/dL (ref 6–20)
CO2: 25 mmol/L (ref 22–32)
Calcium: 9.3 mg/dL (ref 8.9–10.3)
Chloride: 99 mmol/L (ref 98–111)
Creatinine, Ser: 0.65 mg/dL (ref 0.61–1.24)
GFR calc Af Amer: >60 mL/min (ref >60)
GFR calc non Af Amer: >60 mL/min (ref >60)
Glucose, Bld: 272 mg/dL — ABNORMAL HIGH (ref 70–99)
Potassium: 3.9 mmol/L (ref 3.5–5.1)
Sodium: 135 mmol/L (ref 135–145)

## 2019-10-13 LAB — CBC
HCT: 45.6 % (ref 39.0–52.0)
Hemoglobin: 15.6 g/dL (ref 13.0–17.0)
MCH: 29.9 pg (ref 26.0–34.0)
MCHC: 34.2 g/dL (ref 30.0–36.0)
MCV: 87.5 fL (ref 80.0–100.0)
Platelets: 208 10*3/uL (ref 150–400)
RBC: 5.21 MIL/uL (ref 4.22–5.81)
RDW: 11.9 % (ref 11.5–15.5)
WBC: 5.8 10*3/uL (ref 4.0–10.5)
nRBC: 0 % (ref 0.0–0.2)

## 2019-10-13 LAB — TROPONIN I (HIGH SENSITIVITY)
Troponin I (High Sensitivity): 9 ng/L (ref <18)
Troponin I (High Sensitivity): 9 ng/L (ref <18)

## 2019-10-13 NOTE — ED Triage Notes (Signed)
Pt presents to ED with complaints of left sided dull chest pain intermittent x 3 days. Pt states he has a hx of anxiety and unsure if it is related. Pt denies SOB, nausea, dizziness

## 2019-10-13 NOTE — Discharge Instructions (Signed)
Start taking a baby aspirin a day.  Follow-up with your family doctor next week for your elevated sugar.  And follow-up with cardiology in the next couple weeks

## 2019-10-13 NOTE — ED Provider Notes (Signed)
[facility] Provider Note   CSN: [id number] Arrival date & time: 10/13/19  1118     History Chief Complaint  Patient presents with  . Chest Pain    KARSTON HYLAND is a 59 y.o. male.  Patient states she had some minor chest discomfort off and on today.  Only lasting for a few minutes  The history is provided by the patient. No language interpreter was used.  Chest Pain Pain location:  L chest Pain quality: aching   Pain radiates to:  Does not radiate Pain severity:  Mild Onset quality:  Sudden Timing:  Intermittent Chronicity:  New Context: not breathing   Relieved by:  Nothing Associated symptoms: no abdominal pain, no back pain, no cough, no fatigue and no headache        Past Medical History:  Diagnosis Date  . Hypertension   . MRSA (methicillin resistant Staphylococcus aureus)   . Pre-diabetes     Patient Active Problem List   Diagnosis Date Noted  . Hyperlipidemia 12/23/2016  . Bilateral knee pain 06/29/2015  . 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 Diabetes mellitus without complication ([place]) 27/78/2423  . Morbid obesity ([place]) 01/12/2015  . Essential hypertension, benign 01/10/2015    History reviewed. No pertinent surgical history.     Family History  Problem Relation Age of Onset  . Stroke Mother   . Hypertension Mother   . Heart disease Father   . Alzheimer's disease Father   . Hypertension Brother   . Alcohol abuse Brother   . Cancer Maternal Aunt   . Heart disease Maternal Aunt   . Cancer Maternal Uncle   . Heart disease Maternal Uncle   . Cancer Paternal Aunt   . Heart disease Paternal Aunt   . Cancer Paternal Uncle   . Heart disease Paternal Uncle   . Heart disease Paternal Grandmother   . Alzheimer's disease Paternal Grandfather     Social History   Tobacco Use  . Smoking status: Never Smoker  . Smokeless tobacco: Never Used  Vaping Use  . Vaping Use: Never used  Substance Use Topics  . Alcohol use: No  .  Drug use: No    Home Medications Prior to Admission medications   Medication Sig Start Date End Date Taking? Authorizing Provider  amLODipine (NORVASC) 10 MG tablet TAKE 1 TABLET BY MOUTH EVERY DAY 02/25/17   Soyla Dryer, PA-C  cholecalciferol (VITAMIN D) 1000 units tablet Take 1,000 Units by mouth daily.    [provider]  clindamycin (CLEOCIN) 150 MG capsule Take 2 capsules (300 mg total) by mouth 4 (four) times daily. 05/20/17   Triplett, Tammy, PA-C  hydrochlorothiazide (MICROZIDE) 12.5 MG capsule Take 1 capsule (12.5 mg total) by mouth daily. 11/18/16   Soyla Dryer, PA-C  ibuprofen (ADVIL,MOTRIN) 800 MG tablet Take 1 tablet (800 mg total) by mouth 3 (three) times daily. 09/06/16   Evalee Jefferson, PA-C  metFORMIN (GLUCOPHAGE-XR) 500 MG 24 hr tablet Take 1 tablet (500 mg total) by mouth daily with breakfast. 11/18/16   Soyla Dryer, PA-C  metoprolol tartrate (LOPRESSOR) 25 MG tablet Take 1 tablet (25 mg total) 2 (two) times daily by mouth. 12/23/16   Soyla Dryer, PA-C  Multiple Vitamin (MULTIVITAMIN WITH MINERALS) TABS tablet Take 1 tablet by mouth daily.    [provider]  simvastatin (ZOCOR) 20 MG tablet Take 1 tablet (20 mg total) at bedtime by mouth. 12/23/16   Soyla Dryer, PA-C  traMADol (ULTRAM) 50 MG tablet  Take 1 tablet (50 mg total) by mouth every 6 (six) hours as needed. 05/20/17   Triplett, Tammy, PA-C    Allergies    Codeine and Lisinopril  Review of Systems   Review of Systems  Constitutional: Negative for appetite change and fatigue.  HENT: Negative for congestion, ear discharge and sinus pressure.   Eyes: Negative for discharge.  Respiratory: Negative for cough.   Cardiovascular: Positive for chest pain.  Gastrointestinal: Negative for abdominal pain and diarrhea.  Genitourinary: Negative for frequency and hematuria.  Musculoskeletal: Negative for back pain.  Skin: Negative for rash.  Neurological: Negative for seizures and headaches.   Psychiatric/Behavioral: Negative for hallucinations.    Physical Exam Updated Vital Signs BP (!) 168/88 (BP Location: Right Arm)   Pulse 72   Temp 98.5 F (36.9 C) (Oral)   Resp 18   Ht 5\' 11"  (1.803 m)   Wt 117.9 kg   SpO2 96%   BMI 36.26 kg/m   Physical Exam Vitals and nursing note reviewed.  Constitutional:      Appearance: He is well-developed.  HENT:     Head: Normocephalic.     Nose: Nose normal.  Eyes:     General: No scleral icterus.    Conjunctiva/sclera: Conjunctivae normal.  Neck:     Thyroid: No thyromegaly.  Cardiovascular:     Rate and Rhythm: Normal rate and regular rhythm.     Heart sounds: No murmur heard.  No friction rub. No gallop.   Pulmonary:     Breath sounds: No stridor. No wheezing or rales.  Chest:     Chest wall: No tenderness.  Abdominal:     General: There is no distension.     Tenderness: There is no abdominal tenderness. There is no rebound.  Musculoskeletal:        General: Normal range of motion.     Cervical back: Neck supple.  Lymphadenopathy:     Cervical: No cervical adenopathy.  Skin:    Findings: No erythema or rash.  Neurological:     Mental Status: He is oriented to person, place, and time.     Motor: No abnormal muscle tone.     Coordination: Coordination normal.  Psychiatric:        Behavior: Behavior normal.     ED Results / Procedures / Treatments   Labs (all labs ordered are listed, but only abnormal results are displayed) Labs Reviewed  BASIC METABOLIC PANEL - Abnormal; Notable for the following components:      Result Value   Glucose, Bld 272 (*)    All other components within normal limits  CBC  TROPONIN I (HIGH SENSITIVITY)  TROPONIN I (HIGH SENSITIVITY)    EKG EKG Interpretation  Date/Time:  Wednesday October 13 2019 11:56:11 EDT Ventricular Rate:  69 PR Interval:  174 QRS Duration: 104 QT Interval:  400 QTC Calculation: 428 R Axis:   -49 Text Interpretation: Normal sinus rhythm Left  anterior fascicular block Minimal voltage criteria for LVH, may be normal variant ( Cornell product ) Abnormal ECG No STEMI Confirmed by Nanda Quinton [phone]) on 10/13/2019 12:40:58 PM Also confirmed by Milton Ferguson [phone])  on 10/13/2019 4:31:10 PM   Radiology DG Chest 2 View  Result Date: 10/13/2019 CLINICAL DATA:  Chest pain EXAM: CHEST - 2 VIEW COMPARISON:  02/22/2016 FINDINGS: The heart size and mediastinal contours are within normal limits. No focal airspace consolidation, pleural effusion, or pneumothorax. The visualized skeletal structures are unremarkable. IMPRESSION: No active cardiopulmonary  disease. Electronically Signed   By: Davina Poke D.O.   On: 10/13/2019 12:35    Procedures Procedures (including critical care time)  Medications Ordered in ED Medications - No data to display  ED Course  I have reviewed the triage vital signs and the nursing notes.  Pertinent labs & imaging results that were available during my care of the patient were reviewed by me and considered in my medical decision making (see chart for details).    MDM Rules/Calculators/A&P                          Patient with atypical chest pain.  Labs unremarkable except for elevated glucose.  He will be placed on baby aspirin a day and referred to cardiology and also follow-up with primary care doctor for his elevated glucose       This patient presents to the ED for concern of chest pain, this involves an extensive number of treatment options, and is a complaint that carries with it a high risk of complications and morbidity.  The differential diagnosis includes MI.  Atypical chest pain   Lab Tests:   I Ordered, reviewed, and interpreted labs, which included CBC chemistries and troponins.  Patient troponins were normal but glucose was elevated  Medicines ordered:   I ordered medication no medicines given in the emergency department but he was told to start a baby aspirin a day  Imaging Studies  ordered:   I ordered imaging studies which included chest x-ray  I independently visualized and interpreted imaging which showed unremarkable  Additional history obtained:   Additional history obtained from record  Previous records obtained and reviewed.  Consultations Obtained:   Reevaluation:  After the interventions stated above, I reevaluated the patient and found improved  Critical Interventions:  .   Final Clinical Impression(s) / ED Diagnoses Final diagnoses:  Atypical chest pain    Rx / DC Orders ED Discharge Orders    None       Milton Ferguson, MD 10/13/19 1702

## 2020-01-09 ENCOUNTER — Emergency Department (HOSPITAL_COMMUNITY)
Admission: EM | Admit: 2020-01-09 | Discharge: 2020-01-09 | Disposition: A | Discharge status: Home / Self Care | Payer: Medicare Other | Attending: Emergency Medicine | Admitting: Emergency Medicine

## 2020-01-09 ENCOUNTER — Other Ambulatory Visit: Payer: Self-pay

## 2020-01-09 ENCOUNTER — Emergency Department (HOSPITAL_COMMUNITY): Payer: Medicare Other

## 2020-01-09 ENCOUNTER — Encounter (HOSPITAL_COMMUNITY): Payer: Self-pay

## 2020-01-09 DIAGNOSIS — S29012A Strain of muscle and tendon of back wall of thorax, initial encounter: Secondary | ICD-10-CM | POA: Diagnosis not present

## 2020-01-09 DIAGNOSIS — M542 Cervicalgia: Secondary | ICD-10-CM | POA: Diagnosis not present

## 2020-01-09 DIAGNOSIS — Z7984 Long term (current) use of oral hypoglycemic drugs: Secondary | ICD-10-CM | POA: Insufficient documentation

## 2020-01-09 DIAGNOSIS — M25512 Pain in left shoulder: Secondary | ICD-10-CM | POA: Insufficient documentation

## 2020-01-09 DIAGNOSIS — Y93J2 Activity, drum and other percussion instrument playing: Secondary | ICD-10-CM | POA: Insufficient documentation

## 2020-01-09 DIAGNOSIS — Z79899 Other long term (current) drug therapy: Secondary | ICD-10-CM | POA: Insufficient documentation

## 2020-01-09 DIAGNOSIS — S3992XA Unspecified injury of lower back, initial encounter: Secondary | ICD-10-CM | POA: Diagnosis present

## 2020-01-09 DIAGNOSIS — E785 Hyperlipidemia, unspecified: Secondary | ICD-10-CM | POA: Insufficient documentation

## 2020-01-09 DIAGNOSIS — E1169 Type 2 diabetes mellitus with other specified complication: Secondary | ICD-10-CM | POA: Diagnosis not present

## 2020-01-09 DIAGNOSIS — X503XXA Overexertion from repetitive movements, initial encounter: Secondary | ICD-10-CM | POA: Insufficient documentation

## 2020-01-09 DIAGNOSIS — I10 Essential (primary) hypertension: Secondary | ICD-10-CM | POA: Diagnosis not present

## 2020-01-09 MED ORDER — HYDROCODONE-ACETAMINOPHEN 5-325 MG PO TABS: ORAL_TABLET | ORAL | 0 refills | Status: DC

## 2020-01-09 MED ORDER — METHOCARBAMOL 500 MG PO TABS: 500.0000 mg | ORAL_TABLET | Freq: Three times a day (TID) | ORAL | 0 refills | Status: DC

## 2020-01-09 NOTE — ED Triage Notes (Signed)
Pt to er, pt ambulatory to triage, pt states that three days ago he woke up with a stiff neck/neck pain, states that it hurts to put his shirt on or move his head. States that he has never hand anything like this before, denies trauma or any lifting.

## 2020-01-09 NOTE — ED Notes (Signed)
Patient transported to X-ray 

## 2020-01-09 NOTE — Discharge Instructions (Addendum)
Alternate ice and heat to your left upper back.  Continue taking ibuprofen, 600 mg 3 times a day with food.  Follow-up with your primary care provider for recheck if not improving.  Return emergency department for any worsening symptoms.

## 2020-01-09 NOTE — ED Notes (Signed)
Pt states at times pin and needle type pain to upper back .  Has tried motrin and epsom salt baths.

## 2020-01-09 NOTE — ED Notes (Signed)
Pt verbalized understanding of no driving and to use caution within 4 hours of taking pain meds due to meds cause drowsiness 

## 2020-01-09 NOTE — ED Provider Notes (Signed)
[facility] EMERGENCY DEPARTMENT Provider Note   CSN: [id number] Arrival date & time: 01/09/20  1403     History Chief Complaint  Patient presents with  . Neck Pain    Joseph Hogan is a 59 y.o. male.  HPI      Joseph Hogan is a 59 y.o. male who presents to the Emergency Department complaining of left upper back and shoulder pain x3 days.  He states pain began after playing drums for several days.  He describes a "bee stinging" sensation along the left scapular border.  He has tried over-the-counter medications and heat without relief.  He states the pain is reproduced with he raises his left arm.  He also endorses mild tenderness of the left side of his neck.  His symptoms are intermittent and improved at rest. He denies any chest pain, shortness of breath, headaches, dizziness, jaw pain, and pain, numbness or weakness of the left upper extremity.  Past Medical History:  Diagnosis Date  . Hypertension   . MRSA (methicillin resistant Staphylococcus aureus)   . Pre-diabetes     Patient Active Problem List   Diagnosis Date Noted  . Hyperlipidemia 12/23/2016  . Bilateral knee pain 06/29/2015  . 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 Diabetes mellitus without complication ([place]) 09/98/3382  . Morbid obesity ([place]) 01/12/2015  . Essential hypertension, benign 01/10/2015    History reviewed. No pertinent surgical history.     Family History  Problem Relation Age of Onset  . Stroke Mother   . Hypertension Mother   . Heart disease Father   . Alzheimer's disease Father   . Hypertension Brother   . Alcohol abuse Brother   . Cancer Maternal Aunt   . Heart disease Maternal Aunt   . Cancer Maternal Uncle   . Heart disease Maternal Uncle   . Cancer Paternal Aunt   . Heart disease Paternal Aunt   . Cancer Paternal Uncle   . Heart disease Paternal Uncle   . Heart disease Paternal Grandmother   . Alzheimer's disease Paternal Grandfather     Social History   Tobacco Use    . Smoking status: Never Smoker  . Smokeless tobacco: Never Used  Vaping Use  . Vaping Use: Never used  Substance Use Topics  . Alcohol use: No  . Drug use: No    Home Medications Prior to Admission medications   Medication Sig Start Date End Date Taking? Authorizing Provider  amLODipine (NORVASC) 10 MG tablet TAKE 1 TABLET BY MOUTH EVERY DAY 02/25/17   Soyla Dryer, PA-C  cholecalciferol (VITAMIN D) 1000 units tablet Take 1,000 Units by mouth daily.    [provider]  clindamycin (CLEOCIN) 150 MG capsule Take 2 capsules (300 mg total) by mouth 4 (four) times daily. 05/20/17   Miriam Liles, PA-C  hydrochlorothiazide (MICROZIDE) 12.5 MG capsule Take 1 capsule (12.5 mg total) by mouth daily. 11/18/16   Soyla Dryer, PA-C  ibuprofen (ADVIL,MOTRIN) 800 MG tablet Take 1 tablet (800 mg total) by mouth 3 (three) times daily. 09/06/16   Evalee Jefferson, PA-C  metFORMIN (GLUCOPHAGE-XR) 500 MG 24 hr tablet Take 1 tablet (500 mg total) by mouth daily with breakfast. 11/18/16   Soyla Dryer, PA-C  metoprolol tartrate (LOPRESSOR) 25 MG tablet Take 1 tablet (25 mg total) 2 (two) times daily by mouth. 12/23/16   Soyla Dryer, PA-C  Multiple Vitamin (MULTIVITAMIN WITH MINERALS) TABS tablet Take 1 tablet by mouth daily.    [provider]  simvastatin (ZOCOR)  20 MG tablet Take 1 tablet (20 mg total) at bedtime by mouth. 12/23/16   Soyla Dryer, PA-C  traMADol (ULTRAM) 50 MG tablet Take 1 tablet (50 mg total) by mouth every 6 (six) hours as needed. 05/20/17   Brenyn Petrey, PA-C    Allergies    Codeine and Lisinopril  Review of Systems   Review of Systems  Constitutional: Negative for chills and fever.  Eyes: Negative for visual disturbance.  Respiratory: Negative for chest tightness and shortness of breath.   Cardiovascular: Negative for chest pain.  Gastrointestinal: Negative for abdominal pain, nausea and vomiting.  Genitourinary: Negative for difficulty urinating,  dysuria and flank pain.  Musculoskeletal: Positive for myalgias (left upper back pain) and neck pain. Negative for arthralgias and joint swelling.  Skin: Negative for color change and wound.  Neurological: Negative for dizziness, weakness, numbness and headaches.    Physical Exam Updated Vital Signs BP (!) 170/82 (BP Location: Right Arm)   Pulse (!) 56   Temp 98.3 F (36.8 C) (Oral)   Resp 18   Ht 5\' 11"  (1.803 m)   Wt 115.7 kg   SpO2 98%   BMI 35.57 kg/m   Physical Exam Vitals and nursing note reviewed.  Constitutional:      Appearance: Normal appearance.  HENT:     Head: Atraumatic.     Mouth/Throat:     Mouth: Mucous membranes are moist.  Neck:     Comments: Mild tenderness to palpation along the left cervical paraspinal muscles.  No midline tenderness.  No nuchal rigidity. Cardiovascular:     Rate and Rhythm: Normal rate and regular rhythm.     Pulses: Normal pulses.  Pulmonary:     Effort: Pulmonary effort is normal.     Breath sounds: Normal breath sounds.  Abdominal:     Palpations: Abdomen is soft.     Tenderness: There is no abdominal tenderness.  Musculoskeletal:        General: Tenderness present. No swelling or deformity.     Left shoulder: Tenderness present. No swelling, bony tenderness or crepitus. Normal range of motion. Normal strength. Normal pulse.       Arms:     Cervical back: Normal range of motion.     Comments: Patient has localized tenderness to palpation along the left trapezius and rhomboid muscles, mostly along the left scapular border.  No skin changes or midline tenderness of the spine.  Patient has full range of motion of the left shoulder.  Grip strength is strong and symmetrical  Skin:    General: Skin is warm.     Capillary Refill: Capillary refill takes less than 2 seconds.     Findings: No rash.  Neurological:     General: No focal deficit present.     Mental Status: He is alert.     Sensory: No sensory deficit.     Motor: No  weakness.     ED Results / Procedures / Treatments   Labs (all labs ordered are listed, but only abnormal results are displayed) Labs Reviewed - No data to display  EKG None  Radiology No results found.  Procedures Procedures (including critical care time)  Medications Ordered in ED Medications - No data to display  ED Course  I have reviewed the triage vital signs and the nursing notes.  Pertinent labs & imaging results that were available during my care of the patient were reviewed by me and considered in my medical decision making (see chart  for details).    MDM Rules/Calculators/A&P                          Patient here with 3-day history of left upper back pain after playing drums.  No history of fall or other trauma.  Pain is reproduced with palpation and UE resistance.  Exam is consistent with musculoskeletal injury. NV intact.    Vital signs reviewed, patient well-appearing. No distress.  He clearly has reproducible pain of the left upper back mainly within the trapezius and rhomboid muscle areas.  No concerning symptoms for cardiac process, or dissection.  No history of trauma to suggest need for imaging  Patient agreeable to tx plan, will also continue NSAID and out pt f/u.  Return precautions discussed.    Final Clinical Impression(s) / ED Diagnoses Final diagnoses:  Muscle strain of left upper back, initial encounter    Rx / DC Orders ED Discharge Orders    None       Kem Parkinson, PA-C 01/09/20 1657    Milton Ferguson, MD 01/11/20 0830

## 2020-01-14 ENCOUNTER — Other Ambulatory Visit: Payer: Self-pay

## 2020-01-14 ENCOUNTER — Ambulatory Visit
Admission: EM | Admit: 2020-01-14 | Discharge: 2020-01-14 | Disposition: A | Discharge status: Home / Self Care | Payer: Medicare Other

## 2020-01-14 DIAGNOSIS — I10 Essential (primary) hypertension: Secondary | ICD-10-CM | POA: Diagnosis not present

## 2020-01-14 MED ORDER — AMLODIPINE BESYLATE 10 MG PO TABS
10.0000 mg | ORAL_TABLET | Freq: Every day | ORAL | 0 refills | Status: DC
Start: 2020-01-14 — End: 2021-12-22

## 2020-01-14 MED ORDER — CLONIDINE HCL 0.1 MG PO TABS
0.1000 mg | ORAL_TABLET | Freq: Once | ORAL | Status: AC
  Administered 2020-01-14: 0.1 mg via ORAL

## 2020-01-14 MED ORDER — HYDROCHLOROTHIAZIDE 12.5 MG PO TABS
12.5000 mg | ORAL_TABLET | Freq: Every day | ORAL | 0 refills | Status: DC
Start: 2020-01-14 — End: 2021-12-22

## 2020-01-14 NOTE — Discharge Instructions (Signed)
Please continue to monitor blood pressure at home and keep a log Eat a well balanced diet of fruits, vegetables and lean meats.  Avoid foods high in fat and salt Drink water.  At least half your body weight in ounces Exercise for at least 30 minutes daily Continue to take Norvasc 10 mg daily as prescribed HCTZ 12.5 mg daily was added Return or go to the ED if you have any new or worsening symptoms such as vision changes, fatigue, dizziness, chest pain, shortness of breath, nausea, swelling in your hands or feet, urinary symptoms, etc..Marland Kitchen

## 2020-01-14 NOTE — ED Provider Notes (Addendum)
[facility]   [id number] 01/14/20 Arrival Time: 1137  Chief Complaint  Patient presents with  . Hypertension     SUBJECTIVE: History from: patient and family.  Joseph Hogan is a 59 y.o. male who presented to the urgent care for complaint of high blood pressure that started  today .  Hx of HTN x few years.  States blood pressure on average is 130/80.  Takes Norvasc 10 mg daily.  Does have a PCP but was unable to get an appointment today.  Denies HA, vision changes, dizziness, lightheadedness, chest pain, shortness of breath, numbness or tingling in extremities, abdominal pain, changes in bowel or bladder habits.    ROS: As per HPI.  All other pertinent ROS negative.     Past Medical History:  Diagnosis Date  . Hypertension   . MRSA (methicillin resistant Staphylococcus aureus)   . Pre-diabetes    History reviewed. No pertinent surgical history. Allergies  Allergen Reactions  . Codeine Nausea And Vomiting  . Lisinopril Cough   No current facility-administered medications on file prior to encounter.   Current Outpatient Medications on File Prior to Encounter  Medication Sig Dispense Refill  . Ascorbic Acid (VITAMIN C) 100 MG tablet Take 100 mg by mouth daily.    . cholecalciferol (VITAMIN D) 1000 units tablet Take 1,000 Units by mouth daily.    Marland Kitchen ibuprofen (ADVIL,MOTRIN) 800 MG tablet Take 1 tablet (800 mg total) by mouth 3 (three) times daily. 21 tablet 0  . methocarbamol (ROBAXIN) 500 MG tablet Take 1 tablet (500 mg total) by mouth 3 (three) times daily. 21 tablet 0  . Multiple Vitamin (MULTIVITAMIN WITH MINERALS) TABS tablet Take 1 tablet by mouth daily.    . Multiple Vitamins-Minerals (ZINC PO) Take by mouth.    Marland Kitchen VITAMIN E PO Take by mouth.    . clindamycin (CLEOCIN) 150 MG capsule Take 2 capsules (300 mg total) by mouth 4 (four) times daily. 56 capsule 0  . HYDROcodone-acetaminophen (NORCO/VICODIN) 5-325 MG tablet Take one tab po q 4 hrs prn pain 8 tablet 0    . metFORMIN (GLUCOPHAGE-XR) 500 MG 24 hr tablet Take 1 tablet (500 mg total) by mouth daily with breakfast. 30 tablet 1  . metoprolol tartrate (LOPRESSOR) 25 MG tablet Take 1 tablet (25 mg total) 2 (two) times daily by mouth. 60 tablet 1  . simvastatin (ZOCOR) 20 MG tablet Take 1 tablet (20 mg total) at bedtime by mouth. 30 tablet 4   Social History   Socioeconomic History  . Marital status: Divorced    Spouse name: Not on file  . Number of children: Not on file  . Years of education: Not on file  . Highest education level: Not on file  Occupational History  . Not on file  Tobacco Use  . Smoking status: Never Smoker  . Smokeless tobacco: Never Used  Vaping Use  . Vaping Use: Never used  Substance and Sexual Activity  . Alcohol use: No  . Drug use: No  . Sexual activity: Not on file  Other Topics Concern  . Not on file  Social History Narrative  . Not on file   Social Determinants of Health   Financial Resource Strain:   . Difficulty of Paying Living Expenses: Not on file  Food Insecurity:   . Worried About Charity fundraiser in the Last Year: Not on file  . Ran Out of Food in the Last Year: Not on file  Transportation Needs:   .  Lack of Transportation (Medical): Not on file  . Lack of Transportation (Non-Medical): Not on file  Physical Activity:   . Days of Exercise per Week: Not on file  . Minutes of Exercise per Session: Not on file  Stress:   . Feeling of Stress : Not on file  Social Connections:   . Frequency of Communication with Friends and Family: Not on file  . Frequency of Social Gatherings with Friends and Family: Not on file  . Attends Religious Services: Not on file  . Active Member of Clubs or Organizations: Not on file  . Attends Archivist Meetings: Not on file  . Marital Status: Not on file  Intimate Partner Violence:   . Fear of Current or Ex-Partner: Not on file  . Emotionally Abused: Not on file  . Physically Abused: Not on file  .  Sexually Abused: Not on file   Family History  Problem Relation Age of Onset  . Stroke Mother   . Hypertension Mother   . Heart disease Father   . Alzheimer's disease Father   . Hypertension Brother   . Alcohol abuse Brother   . Cancer Maternal Aunt   . Heart disease Maternal Aunt   . Cancer Maternal Uncle   . Heart disease Maternal Uncle   . Cancer Paternal Aunt   . Heart disease Paternal Aunt   . Cancer Paternal Uncle   . Heart disease Paternal Uncle   . Heart disease Paternal Grandmother   . Alzheimer's disease Paternal Grandfather     OBJECTIVE:  Vitals:   01/14/20 1239 01/14/20 1302  BP: (!) 178/83 (!) 167/80  Pulse: 63   Resp: 18   Temp: 97.9 F (36.6 C)   TempSrc: Oral   SpO2: 97%      Physical Exam Vitals and nursing note reviewed.  Constitutional:      General: He is not in acute distress.    Appearance: Normal appearance. He is normal weight. He is not ill-appearing, toxic-appearing or diaphoretic.  HENT:     Head: Normocephalic.     Right Ear: Tympanic membrane, ear canal and external ear normal.     Left Ear: Tympanic membrane, ear canal and external ear normal.  Neck:     Vascular: No carotid bruit.  Cardiovascular:     Rate and Rhythm: Normal rate and regular rhythm.     Pulses: Normal pulses.     Heart sounds: Normal heart sounds. No murmur heard.  No friction rub. No gallop.   Pulmonary:     Effort: Pulmonary effort is normal. No respiratory distress.     Breath sounds: Normal breath sounds. No stridor. No wheezing, rhonchi or rales.  Chest:     Chest wall: No tenderness.  Musculoskeletal:     Cervical back: Normal range of motion.  Neurological:     General: No focal deficit present.     Mental Status: He is alert and oriented to person, place, and time.     GCS: GCS eye subscore is 4. GCS verbal subscore is 5. GCS motor subscore is 6.     Cranial Nerves: Cranial nerves are intact.     Sensory: Sensation is intact.     Motor: Motor  function is intact.     Coordination: Coordination is intact.     Gait: Gait is intact.     LABS:  No results found for this or any previous visit (from the past 24 hour(s)).   ASSESSMENT & PLAN:  No diagnosis found.  Meds ordered this encounter  Medications  . cloNIDine (CATAPRES) tablet 0.1 mg  . amLODipine (NORVASC) 10 MG tablet    Sig: Take 1 tablet (10 mg total) by mouth daily.    Dispense:  30 tablet    Refill:  0  . hydrochlorothiazide (HYDRODIURIL) 12.5 MG tablet    Sig: Take 1 tablet (12.5 mg total) by mouth daily.    Dispense:  10 tablet    Refill:  0    Discharge instructions  Please continue to monitor blood pressure at home and keep a log Eat a well balanced diet of fruits, vegetables and lean meats.  Avoid foods high in fat and salt Drink water.  At least half your body weight in ounces Exercise for at least 30 minutes daily Continue to take Norvasc 10 mg daily as prescribed HCTZ 12.5 mg daily was added Return or go to the ED if you have any new or worsening symptoms such as vision changes, fatigue, dizziness, chest pain, shortness of breath, nausea, swelling in your hands or feet, urinary symptoms, etc...  Reviewed expectations re: course of current medical issues. Questions answered. Outlined signs and symptoms indicating need for more acute intervention. Patient verbalized understanding. After Visit Summary given.         Emerson Monte, FNP 01/14/20 1314    Emerson Monte, FNP 01/14/20 1324

## 2020-01-14 NOTE — ED Triage Notes (Signed)
Pt went to chiropractor today and was told his BP was 170s/80.  Was told to go to [facility] and have it checked out.  178/83 in triage.  Denies HA, CP, dizziness.

## 2020-01-26 ENCOUNTER — Other Ambulatory Visit: Payer: Self-pay | Admitting: Sports Medicine

## 2020-01-26 ENCOUNTER — Other Ambulatory Visit (HOSPITAL_COMMUNITY): Payer: Self-pay | Admitting: Family Medicine

## 2020-01-26 ENCOUNTER — Other Ambulatory Visit (HOSPITAL_COMMUNITY): Payer: Self-pay | Admitting: Sports Medicine

## 2020-01-26 DIAGNOSIS — M25512 Pain in left shoulder: Secondary | ICD-10-CM

## 2020-01-27 ENCOUNTER — Ambulatory Visit (HOSPITAL_COMMUNITY)
Admission: RE | Admit: 2020-01-27 | Discharge: 2020-01-27 | Disposition: A | Discharge status: Home / Self Care | Payer: Medicare Other | Source: Ambulatory Visit | Attending: Family Medicine | Admitting: Family Medicine

## 2020-01-27 ENCOUNTER — Other Ambulatory Visit (HOSPITAL_COMMUNITY): Payer: Self-pay | Admitting: Family Medicine

## 2020-01-27 ENCOUNTER — Other Ambulatory Visit: Payer: Self-pay

## 2020-01-27 DIAGNOSIS — M25512 Pain in left shoulder: Secondary | ICD-10-CM | POA: Diagnosis not present

## 2020-01-31 ENCOUNTER — Ambulatory Visit (HOSPITAL_COMMUNITY)
Admission: RE | Admit: 2020-01-31 | Discharge: 2020-01-31 | Disposition: A | Discharge status: Home / Self Care | Payer: Medicare Other | Source: Ambulatory Visit | Attending: Sports Medicine | Admitting: Sports Medicine

## 2020-01-31 ENCOUNTER — Other Ambulatory Visit (HOSPITAL_COMMUNITY): Payer: Self-pay | Admitting: Sports Medicine

## 2020-01-31 ENCOUNTER — Other Ambulatory Visit: Payer: Self-pay

## 2020-01-31 DIAGNOSIS — M25512 Pain in left shoulder: Secondary | ICD-10-CM | POA: Diagnosis not present

## 2020-02-15 DIAGNOSIS — M9902 Segmental and somatic dysfunction of thoracic region: Secondary | ICD-10-CM | POA: Diagnosis not present

## 2020-02-15 DIAGNOSIS — S233XXA Sprain of ligaments of thoracic spine, initial encounter: Secondary | ICD-10-CM | POA: Diagnosis not present

## 2020-02-15 DIAGNOSIS — S134XXA Sprain of ligaments of cervical spine, initial encounter: Secondary | ICD-10-CM | POA: Diagnosis not present

## 2020-02-15 DIAGNOSIS — M9901 Segmental and somatic dysfunction of cervical region: Secondary | ICD-10-CM | POA: Diagnosis not present

## 2020-02-25 DIAGNOSIS — M9902 Segmental and somatic dysfunction of thoracic region: Secondary | ICD-10-CM | POA: Diagnosis not present

## 2020-02-25 DIAGNOSIS — S233XXA Sprain of ligaments of thoracic spine, initial encounter: Secondary | ICD-10-CM | POA: Diagnosis not present

## 2020-02-25 DIAGNOSIS — M9901 Segmental and somatic dysfunction of cervical region: Secondary | ICD-10-CM | POA: Diagnosis not present

## 2020-02-25 DIAGNOSIS — S134XXA Sprain of ligaments of cervical spine, initial encounter: Secondary | ICD-10-CM | POA: Diagnosis not present

## 2020-03-09 DIAGNOSIS — S233XXA Sprain of ligaments of thoracic spine, initial encounter: Secondary | ICD-10-CM | POA: Diagnosis not present

## 2020-03-09 DIAGNOSIS — M9901 Segmental and somatic dysfunction of cervical region: Secondary | ICD-10-CM | POA: Diagnosis not present

## 2020-03-09 DIAGNOSIS — M9902 Segmental and somatic dysfunction of thoracic region: Secondary | ICD-10-CM | POA: Diagnosis not present

## 2020-03-09 DIAGNOSIS — S134XXA Sprain of ligaments of cervical spine, initial encounter: Secondary | ICD-10-CM | POA: Diagnosis not present

## 2020-03-14 DIAGNOSIS — M9901 Segmental and somatic dysfunction of cervical region: Secondary | ICD-10-CM | POA: Diagnosis not present

## 2020-03-14 DIAGNOSIS — M9902 Segmental and somatic dysfunction of thoracic region: Secondary | ICD-10-CM | POA: Diagnosis not present

## 2020-03-14 DIAGNOSIS — S134XXA Sprain of ligaments of cervical spine, initial encounter: Secondary | ICD-10-CM | POA: Diagnosis not present

## 2020-03-14 DIAGNOSIS — S233XXA Sprain of ligaments of thoracic spine, initial encounter: Secondary | ICD-10-CM | POA: Diagnosis not present

## 2020-03-30 DIAGNOSIS — M9901 Segmental and somatic dysfunction of cervical region: Secondary | ICD-10-CM | POA: Diagnosis not present

## 2020-03-30 DIAGNOSIS — S233XXA Sprain of ligaments of thoracic spine, initial encounter: Secondary | ICD-10-CM | POA: Diagnosis not present

## 2020-03-30 DIAGNOSIS — M9902 Segmental and somatic dysfunction of thoracic region: Secondary | ICD-10-CM | POA: Diagnosis not present

## 2020-03-30 DIAGNOSIS — S134XXA Sprain of ligaments of cervical spine, initial encounter: Secondary | ICD-10-CM | POA: Diagnosis not present

## 2020-04-25 DIAGNOSIS — M9901 Segmental and somatic dysfunction of cervical region: Secondary | ICD-10-CM | POA: Diagnosis not present

## 2020-04-25 DIAGNOSIS — S134XXA Sprain of ligaments of cervical spine, initial encounter: Secondary | ICD-10-CM | POA: Diagnosis not present

## 2020-04-25 DIAGNOSIS — S233XXA Sprain of ligaments of thoracic spine, initial encounter: Secondary | ICD-10-CM | POA: Diagnosis not present

## 2020-04-25 DIAGNOSIS — M9902 Segmental and somatic dysfunction of thoracic region: Secondary | ICD-10-CM | POA: Diagnosis not present

## 2020-05-03 DIAGNOSIS — S233XXA Sprain of ligaments of thoracic spine, initial encounter: Secondary | ICD-10-CM | POA: Diagnosis not present

## 2020-05-03 DIAGNOSIS — M9901 Segmental and somatic dysfunction of cervical region: Secondary | ICD-10-CM | POA: Diagnosis not present

## 2020-05-03 DIAGNOSIS — M9902 Segmental and somatic dysfunction of thoracic region: Secondary | ICD-10-CM | POA: Diagnosis not present

## 2020-05-03 DIAGNOSIS — S134XXA Sprain of ligaments of cervical spine, initial encounter: Secondary | ICD-10-CM | POA: Diagnosis not present

## 2020-05-24 DIAGNOSIS — S233XXA Sprain of ligaments of thoracic spine, initial encounter: Secondary | ICD-10-CM | POA: Diagnosis not present

## 2020-05-24 DIAGNOSIS — S134XXA Sprain of ligaments of cervical spine, initial encounter: Secondary | ICD-10-CM | POA: Diagnosis not present

## 2020-05-24 DIAGNOSIS — M9902 Segmental and somatic dysfunction of thoracic region: Secondary | ICD-10-CM | POA: Diagnosis not present

## 2020-05-24 DIAGNOSIS — M9901 Segmental and somatic dysfunction of cervical region: Secondary | ICD-10-CM | POA: Diagnosis not present

## 2020-06-05 DIAGNOSIS — S134XXA Sprain of ligaments of cervical spine, initial encounter: Secondary | ICD-10-CM | POA: Diagnosis not present

## 2020-06-05 DIAGNOSIS — M9902 Segmental and somatic dysfunction of thoracic region: Secondary | ICD-10-CM | POA: Diagnosis not present

## 2020-06-05 DIAGNOSIS — M9901 Segmental and somatic dysfunction of cervical region: Secondary | ICD-10-CM | POA: Diagnosis not present

## 2020-06-05 DIAGNOSIS — S233XXA Sprain of ligaments of thoracic spine, initial encounter: Secondary | ICD-10-CM | POA: Diagnosis not present

## 2020-06-26 DIAGNOSIS — S233XXA Sprain of ligaments of thoracic spine, initial encounter: Secondary | ICD-10-CM | POA: Diagnosis not present

## 2020-06-26 DIAGNOSIS — M9901 Segmental and somatic dysfunction of cervical region: Secondary | ICD-10-CM | POA: Diagnosis not present

## 2020-06-26 DIAGNOSIS — M9902 Segmental and somatic dysfunction of thoracic region: Secondary | ICD-10-CM | POA: Diagnosis not present

## 2020-06-26 DIAGNOSIS — S134XXA Sprain of ligaments of cervical spine, initial encounter: Secondary | ICD-10-CM | POA: Diagnosis not present

## 2020-07-17 DIAGNOSIS — M9902 Segmental and somatic dysfunction of thoracic region: Secondary | ICD-10-CM | POA: Diagnosis not present

## 2020-07-17 DIAGNOSIS — M9901 Segmental and somatic dysfunction of cervical region: Secondary | ICD-10-CM | POA: Diagnosis not present

## 2020-07-17 DIAGNOSIS — S233XXA Sprain of ligaments of thoracic spine, initial encounter: Secondary | ICD-10-CM | POA: Diagnosis not present

## 2020-07-17 DIAGNOSIS — S134XXA Sprain of ligaments of cervical spine, initial encounter: Secondary | ICD-10-CM | POA: Diagnosis not present

## 2020-08-04 DIAGNOSIS — S134XXA Sprain of ligaments of cervical spine, initial encounter: Secondary | ICD-10-CM | POA: Diagnosis not present

## 2020-08-04 DIAGNOSIS — M9902 Segmental and somatic dysfunction of thoracic region: Secondary | ICD-10-CM | POA: Diagnosis not present

## 2020-08-04 DIAGNOSIS — M9901 Segmental and somatic dysfunction of cervical region: Secondary | ICD-10-CM | POA: Diagnosis not present

## 2020-08-04 DIAGNOSIS — S233XXA Sprain of ligaments of thoracic spine, initial encounter: Secondary | ICD-10-CM | POA: Diagnosis not present

## 2020-08-28 DIAGNOSIS — S233XXA Sprain of ligaments of thoracic spine, initial encounter: Secondary | ICD-10-CM | POA: Diagnosis not present

## 2020-08-28 DIAGNOSIS — M9901 Segmental and somatic dysfunction of cervical region: Secondary | ICD-10-CM | POA: Diagnosis not present

## 2020-08-28 DIAGNOSIS — M9902 Segmental and somatic dysfunction of thoracic region: Secondary | ICD-10-CM | POA: Diagnosis not present

## 2020-08-28 DIAGNOSIS — S134XXA Sprain of ligaments of cervical spine, initial encounter: Secondary | ICD-10-CM | POA: Diagnosis not present

## 2020-09-20 DIAGNOSIS — S233XXA Sprain of ligaments of thoracic spine, initial encounter: Secondary | ICD-10-CM | POA: Diagnosis not present

## 2020-09-20 DIAGNOSIS — M9902 Segmental and somatic dysfunction of thoracic region: Secondary | ICD-10-CM | POA: Diagnosis not present

## 2020-09-20 DIAGNOSIS — S134XXA Sprain of ligaments of cervical spine, initial encounter: Secondary | ICD-10-CM | POA: Diagnosis not present

## 2020-09-20 DIAGNOSIS — M9901 Segmental and somatic dysfunction of cervical region: Secondary | ICD-10-CM | POA: Diagnosis not present

## 2020-10-30 DIAGNOSIS — S134XXA Sprain of ligaments of cervical spine, initial encounter: Secondary | ICD-10-CM | POA: Diagnosis not present

## 2020-10-30 DIAGNOSIS — M9902 Segmental and somatic dysfunction of thoracic region: Secondary | ICD-10-CM | POA: Diagnosis not present

## 2020-10-30 DIAGNOSIS — M9901 Segmental and somatic dysfunction of cervical region: Secondary | ICD-10-CM | POA: Diagnosis not present

## 2020-10-30 DIAGNOSIS — S233XXA Sprain of ligaments of thoracic spine, initial encounter: Secondary | ICD-10-CM | POA: Diagnosis not present

## 2020-11-27 ENCOUNTER — Emergency Department (HOSPITAL_COMMUNITY): Payer: Medicare HMO

## 2020-11-27 ENCOUNTER — Other Ambulatory Visit: Payer: Self-pay

## 2020-11-27 ENCOUNTER — Encounter (HOSPITAL_COMMUNITY): Payer: Self-pay | Admitting: Emergency Medicine

## 2020-11-27 ENCOUNTER — Emergency Department (HOSPITAL_COMMUNITY)
Admission: EM | Admit: 2020-11-27 | Discharge: 2020-11-27 | Disposition: A | Discharge status: Home / Self Care | Payer: Medicare HMO | Attending: Emergency Medicine | Admitting: Emergency Medicine

## 2020-11-27 DIAGNOSIS — I10 Essential (primary) hypertension: Secondary | ICD-10-CM | POA: Insufficient documentation

## 2020-11-27 DIAGNOSIS — F4322 Adjustment disorder with anxiety: Secondary | ICD-10-CM | POA: Insufficient documentation

## 2020-11-27 DIAGNOSIS — Z7984 Long term (current) use of oral hypoglycemic drugs: Secondary | ICD-10-CM | POA: Insufficient documentation

## 2020-11-27 DIAGNOSIS — F4321 Adjustment disorder with depressed mood: Secondary | ICD-10-CM | POA: Diagnosis not present

## 2020-11-27 DIAGNOSIS — E119 Type 2 diabetes mellitus without complications: Secondary | ICD-10-CM | POA: Insufficient documentation

## 2020-11-27 DIAGNOSIS — Z79899 Other long term (current) drug therapy: Secondary | ICD-10-CM | POA: Diagnosis not present

## 2020-11-27 DIAGNOSIS — F419 Anxiety disorder, unspecified: Secondary | ICD-10-CM | POA: Insufficient documentation

## 2020-11-27 DIAGNOSIS — R030 Elevated blood-pressure reading, without diagnosis of hypertension: Secondary | ICD-10-CM

## 2020-11-27 DIAGNOSIS — R0602 Shortness of breath: Secondary | ICD-10-CM | POA: Diagnosis not present

## 2020-11-27 DIAGNOSIS — R55 Syncope and collapse: Secondary | ICD-10-CM | POA: Diagnosis not present

## 2020-11-27 DIAGNOSIS — R079 Chest pain, unspecified: Secondary | ICD-10-CM | POA: Diagnosis not present

## 2020-11-27 DIAGNOSIS — R42 Dizziness and giddiness: Secondary | ICD-10-CM | POA: Diagnosis not present

## 2020-11-27 LAB — CBC WITH DIFFERENTIAL/PLATELET
Abs Immature Granulocytes: 0.03 10*3/uL (ref 0.00–0.07)
Basophils Absolute: 0.1 10*3/uL (ref 0.0–0.1)
Basophils Relative: 1 %
Eosinophils Absolute: 0.1 10*3/uL (ref 0.0–0.5)
Eosinophils Relative: 1 %
HCT: 42.8 % (ref 39.0–52.0)
Hemoglobin: 14.9 g/dL (ref 13.0–17.0)
Immature Granulocytes: 0 %
Lymphocytes Relative: 13 %
Lymphs Abs: 1.2 10*3/uL (ref 0.7–4.0)
MCH: 30.4 pg (ref 26.0–34.0)
MCHC: 34.8 g/dL (ref 30.0–36.0)
MCV: 87.3 fL (ref 80.0–100.0)
Monocytes Absolute: 0.6 10*3/uL (ref 0.1–1.0)
Monocytes Relative: 7 %
Neutro Abs: 7.3 10*3/uL (ref 1.7–7.7)
Neutrophils Relative %: 78 %
Platelets: 239 10*3/uL (ref 150–400)
RBC: 4.9 MIL/uL (ref 4.22–5.81)
RDW: 12.6 % (ref 11.5–15.5)
WBC: 9.2 10*3/uL (ref 4.0–10.5)
nRBC: 0 % (ref 0.0–0.2)

## 2020-11-27 LAB — COMPREHENSIVE METABOLIC PANEL
ALT: 29 U/L (ref 0–44)
AST: 23 U/L (ref 15–41)
Albumin: 4.6 g/dL (ref 3.5–5.0)
Alkaline Phosphatase: 54 U/L (ref 38–126)
Anion gap: 8 (ref 5–15)
BUN: 19 mg/dL (ref 8–23)
CO2: 23 mmol/L (ref 22–32)
Calcium: 9.3 mg/dL (ref 8.9–10.3)
Chloride: 106 mmol/L (ref 98–111)
Creatinine, Ser: 0.64 mg/dL (ref 0.61–1.24)
GFR, Estimated: >60 mL/min (ref >60)
Glucose, Bld: 163 mg/dL — ABNORMAL HIGH (ref 70–99)
Potassium: 3.6 mmol/L (ref 3.5–5.1)
Sodium: 137 mmol/L (ref 135–145)
Total Bilirubin: 0.7 mg/dL (ref 0.3–1.2)
Total Protein: 7.8 g/dL (ref 6.5–8.1)

## 2020-11-27 LAB — TROPONIN I (HIGH SENSITIVITY)
Troponin I (High Sensitivity): 15 ng/L (ref <18)
Troponin I (High Sensitivity): 16 ng/L (ref <18)

## 2020-11-27 LAB — LIPASE, BLOOD: Lipase: 38 U/L (ref 11–51)

## 2020-11-27 MED ORDER — HYDROXYZINE HCL 25 MG PO TABS
25.0000 mg | ORAL_TABLET | Freq: Once | ORAL | Status: AC
  Administered 2020-11-27: 25 mg via ORAL
  Filled 2020-11-27: qty 1

## 2020-11-27 MED ORDER — LORAZEPAM 0.5 MG PO TABS: 0.5000 mg | ORAL_TABLET | Freq: Three times a day (TID) | ORAL | 0 refills | Status: DC | PRN

## 2020-11-27 MED ORDER — LORAZEPAM 1 MG PO TABS
1.0000 mg | ORAL_TABLET | Freq: Once | ORAL | Status: AC
  Administered 2020-11-27: 1 mg via ORAL
  Filled 2020-11-27: qty 1

## 2020-11-27 NOTE — ED Notes (Signed)
Pt ambulatory to bathroom w/ no complaints at this time

## 2020-11-27 NOTE — ED Triage Notes (Signed)
Pt brought in by his pastor after he just received the news that his son and his sons girlfried died of a Fentanyl overdose. Pt states he became "weak in the knees and dizzy and wanted to be checked out".

## 2020-11-27 NOTE — Discharge Instructions (Addendum)
Your blood work is reassuring.  Follow-up with your primary doctor.  Take the anxiety medication as needed.  It may make you sleepy so do not take it if you are working or driving.  Return to the ED with chest pain, shortness of breath, unilateral weakness, numbness, tingling, difficulty speaking or difficulty swallowing or any other concerns.

## 2020-11-27 NOTE — ED Notes (Signed)
Pt returned from CT scan.  Pt has been ambulating in room w/o difficulty. MD aware.

## 2020-11-27 NOTE — ED Provider Notes (Signed)
[facility] EMERGENCY DEPARTMENT Provider Note   CSN: [id number] Arrival date & time: 11/27/20  0316     History Chief Complaint  Patient presents with   Panic Attack    Joseph Hogan is a 60 y.o. male.  Patient with a history of hypertension on amlodipine only presenting with weakness and dizziness.  He states he received news this evening that his son and son's girlfriend died of a fentanyl overdose around midnight.  He was feeling well prior to this.  Since getting the news, patient has had intermittent dizziness,, "weak in the knees, lightheadedness.  The symptoms come and go.  He denies any focal weakness, numbness or tingling.  No difficulty speaking or difficulty swallowing.  No chest pain or shortness of breath. Says he feels both lightheaded and room spinning dizziness that comes and goes lasting for several minutes at a time only when he is trying to talk to somebody about the news.  No weakness in his arms or legs.  No difficulty speaking or difficulty swallowing.  No headache or visual changes. No prior history of anxiety.  Was feeling fine prior to receiving the news of his son's death. States his blood pressure is normally well controlled on amlodipine.  The history is provided by the patient.      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 No pertinent surgical history.     Family History  Problem Relation Age of Onset   Stroke Mother    Hypertension Mother    Heart disease Father    Alzheimer's disease Father    Hypertension Brother    Alcohol abuse Brother     Cancer Maternal Aunt    Heart disease Maternal Aunt    Cancer Maternal Uncle    Heart disease Maternal Uncle    Cancer Paternal Aunt    Heart disease Paternal Aunt    Cancer Paternal Uncle    Heart disease Paternal Uncle    Heart disease Paternal Grandmother    Alzheimer's disease Paternal Grandfather     Social History   Tobacco Use   Smoking status: Never   Smokeless tobacco: Never  Vaping Use   Vaping Use: Never used  Substance Use Topics   Alcohol use: No   Drug use: No    Home Medications Prior to Admission medications   Medication Sig Start Date End Date Taking? Authorizing Provider  amLODipine (NORVASC) 10 MG tablet Take 1 tablet (10 mg total) by mouth daily. 01/14/20   Avegno, Darrelyn Hillock, FNP  Ascorbic Acid (VITAMIN C) 100 MG tablet Take 100 mg by mouth daily.    [provider]  cholecalciferol (VITAMIN D) 1000 units tablet Take 1,000 Units by mouth daily.    [provider]  clindamycin (CLEOCIN) 150 MG capsule Take 2 capsules (300 mg total) by mouth 4 (four) times daily. 05/20/17   Triplett, Tammy, PA-C  hydrochlorothiazide (HYDRODIURIL) 12.5 MG tablet Take 1 tablet (12.5 mg total) by mouth daily. 01/14/20   Avegno, Darrelyn Hillock, FNP  HYDROcodone-acetaminophen (NORCO/VICODIN) 5-325 MG tablet Take one tab po q 4 hrs prn pain  01/09/20   Triplett, Tammy, PA-C  ibuprofen (ADVIL,MOTRIN) 800 MG tablet Take 1 tablet (800 mg total) by mouth 3 (three) times daily. 09/06/16   Evalee Jefferson, PA-C  metFORMIN (GLUCOPHAGE-XR) 500 MG 24 hr tablet Take 1 tablet (500 mg total) by mouth daily with breakfast. 11/18/16   Soyla Dryer, PA-C  methocarbamol (ROBAXIN) 500 MG tablet Take 1 tablet (500 mg total) by mouth 3 (three) times daily. 01/09/20   Triplett, Tammy, PA-C  metoprolol tartrate (LOPRESSOR) 25 MG tablet Take 1 tablet (25 mg total) 2 (two) times daily by mouth. 12/23/16   Soyla Dryer, PA-C  Multiple Vitamin (MULTIVITAMIN WITH MINERALS) TABS tablet Take 1 tablet  by mouth daily.    [provider]  Multiple Vitamins-Minerals (ZINC PO) Take by mouth.    [provider]  simvastatin (ZOCOR) 20 MG tablet Take 1 tablet (20 mg total) at bedtime by mouth. 12/23/16   Soyla Dryer, PA-C  VITAMIN E PO Take by mouth.    [provider]    Allergies    Codeine and Lisinopril  Review of Systems   Review of Systems  Constitutional:  Negative for activity change, appetite change, fatigue and fever.  HENT:  Negative for congestion and rhinorrhea.   Respiratory:  Negative for chest tightness and shortness of breath.   Cardiovascular:  Negative for chest pain.  Gastrointestinal:  Negative for abdominal pain, nausea and vomiting.  Genitourinary:  Negative for dysuria and hematuria.  Musculoskeletal:  Negative for arthralgias and myalgias.  Skin:  Negative for rash.  Neurological:  Positive for dizziness, weakness and light-headedness. Negative for syncope.   all other systems are negative except as noted in the HPI and PMH.   Physical Exam Updated Vital Signs BP (!) 163/88 (BP Location: Right Arm)   Pulse 78   Temp 98.3 F (36.8 C) (Temporal)   Resp 20   Ht 5\' 11"  (1.803 m)   Wt 106.6 kg   SpO2 100%   BMI 32.78 kg/m   Physical Exam Vitals and nursing note reviewed.  Constitutional:      General: He is not in acute distress.    Appearance: He is well-developed.     Comments: tearful  HENT:     Head: Normocephalic and atraumatic.     Mouth/Throat:     Pharynx: No oropharyngeal exudate.  Eyes:     Conjunctiva/sclera: Conjunctivae normal.     Pupils: Pupils are equal, round, and reactive to light.  Neck:     Comments: No meningismus. Cardiovascular:     Rate and Rhythm: Normal rate and regular rhythm.     Heart sounds: Normal heart sounds. No murmur heard. Pulmonary:     Effort: Pulmonary effort is normal. No respiratory distress.     Breath sounds: Normal breath sounds.  Abdominal:     Palpations: Abdomen is  soft.     Tenderness: There is no abdominal tenderness. There is no guarding or rebound.  Musculoskeletal:        General: No tenderness. Normal range of motion.     Cervical back: Normal range of motion and neck supple.  Skin:    General: Skin is warm.  Neurological:     Mental Status: He is alert and oriented to person, place, and time.     Cranial Nerves: No cranial nerve deficit.     Motor: No abnormal muscle tone.     Coordination: Coordination normal.     Comments: CN 2-12 intact, no ataxia on  finger to nose, no nystagmus, 5/5 strength throughout, no pronator drift, Romberg negative, normal gait.  No nystagmus, no ataxia on finger-to-nose.  Psychiatric:        Behavior: Behavior normal.    ED Results / Procedures / Treatments   Labs (all labs ordered are listed, but only abnormal results are displayed) Labs Reviewed  COMPREHENSIVE METABOLIC PANEL - Abnormal; Notable for the following components:      Result Value   Glucose, Bld 163 (*)    All other components within normal limits  CBC WITH DIFFERENTIAL/PLATELET  LIPASE, BLOOD  TROPONIN I (HIGH SENSITIVITY)  TROPONIN I (HIGH SENSITIVITY)    EKG EKG Interpretation  Date/Time:  Monday November 27 2020 03:51:11 EDT Ventricular Rate:  83 PR Interval:  184 QRS Duration: 101 QT Interval:  380 QTC Calculation: 447 R Axis:   -51 Text Interpretation: Sinus rhythm Incomplete RBBB and LAFB Anteroseptal infarct, age indeterminate new RBBB Confirmed by Ezequiel Essex [phone]) on 11/27/2020 3:56:46 AM  Radiology DG Chest 2 View  Result Date: 11/27/2020 CLINICAL DATA:  The patient states feeling chest pain, SOB, and near syncope when finding out his son and his son's girlfriend passed away tonight. Hx of HTN. Not being tested for covid-19. EXAM: CHEST - 2 VIEW COMPARISON:  10/13/2019 FINDINGS: Lungs are clear. Heart size and mediastinal contours are within normal limits. No effusion. Anterior vertebral endplate spurring at  multiple levels in the mid thoracic spine. IMPRESSION: No acute cardiopulmonary disease. Electronically Signed   By: Lucrezia Europe M.D.   On: 11/27/2020 05:23   CT Head Wo Contrast  Result Date: 11/27/2020 CLINICAL DATA:  Dizziness EXAM: CT HEAD WITHOUT CONTRAST TECHNIQUE: Contiguous axial images were obtained from the base of the skull through the vertex without intravenous contrast. COMPARISON:  11/20/2014 FINDINGS: Brain: Stable hypoattenuation in right parietal white matter. No acute intracranial hemorrhage, midline shift, acute infarct, hydrocephalus, or mass. Vascular: No hyperdense vessel or unexpected calcification. Skull: Normal. Negative for fracture or focal lesion. Sinuses/Orbits: No acute finding. Other: None IMPRESSION: No acute findings. Stable right parietal white matter hypoattenuation. Electronically Signed   By: Lucrezia Europe M.D.   On: 11/27/2020 06:29    Procedures Procedures   Medications Ordered in ED Medications - No data to display  ED Course  I have reviewed the triage vital signs and the nursing notes.  Pertinent labs & imaging results that were available during my care of the patient were reviewed by me and considered in my medical decision making (see chart for details).    MDM Rules/Calculators/A&P                           Weakness and dizziness after receiving news of his son's death.  He was fine prior to this.  Neurological exam is nonfocal.  No ataxia or nystagmus.  EKG shows sinus rhythm without acute ST changes but does show new right bundle branch block.  Blood pressure elevated.  Patient states compliance with his amlodipine.  He has several reasons to have elevated blood pressure tonight states his blood pressure is normally well controlled.  He denies any chest pain or shortness of breath.  Neurological exam is nonfocal.  CT head is stable. Labs are reassuring.  Troponin negative.  Advised to keep a record of his blood pressure and follow-up with his  doctor.  Small course of Ativan given for his anxiety over the next several days.  Cautioned to not  drive or operate heavy machinery while taking this medication.  Did feel improved with Ativan in the ED.  Discussed PCP follow-up for blood pressure recheck.  Low suspicion for ACS, PE or TIA/CVA. Second troponin pending at shift change Return precautions discussed Final Clinical Impression(s) / ED Diagnoses Final diagnoses:  Anxiety  Grief reaction    Rx / DC Orders ED Discharge Orders     None        Graeme Menees, Annie Main, MD 11/27/20 [phone]

## 2020-11-27 NOTE — ED Notes (Signed)
Patient transported to X-ray 

## 2020-12-27 DIAGNOSIS — M9901 Segmental and somatic dysfunction of cervical region: Secondary | ICD-10-CM | POA: Diagnosis not present

## 2020-12-27 DIAGNOSIS — M9902 Segmental and somatic dysfunction of thoracic region: Secondary | ICD-10-CM | POA: Diagnosis not present

## 2020-12-27 DIAGNOSIS — S134XXA Sprain of ligaments of cervical spine, initial encounter: Secondary | ICD-10-CM | POA: Diagnosis not present

## 2020-12-27 DIAGNOSIS — S233XXA Sprain of ligaments of thoracic spine, initial encounter: Secondary | ICD-10-CM | POA: Diagnosis not present

## 2021-02-15 DIAGNOSIS — M9902 Segmental and somatic dysfunction of thoracic region: Secondary | ICD-10-CM | POA: Diagnosis not present

## 2021-02-15 DIAGNOSIS — S134XXA Sprain of ligaments of cervical spine, initial encounter: Secondary | ICD-10-CM | POA: Diagnosis not present

## 2021-02-15 DIAGNOSIS — M9901 Segmental and somatic dysfunction of cervical region: Secondary | ICD-10-CM | POA: Diagnosis not present

## 2021-02-15 DIAGNOSIS — S233XXA Sprain of ligaments of thoracic spine, initial encounter: Secondary | ICD-10-CM | POA: Diagnosis not present

## 2021-02-19 DIAGNOSIS — M9901 Segmental and somatic dysfunction of cervical region: Secondary | ICD-10-CM | POA: Diagnosis not present

## 2021-02-19 DIAGNOSIS — M9902 Segmental and somatic dysfunction of thoracic region: Secondary | ICD-10-CM | POA: Diagnosis not present

## 2021-02-19 DIAGNOSIS — S134XXA Sprain of ligaments of cervical spine, initial encounter: Secondary | ICD-10-CM | POA: Diagnosis not present

## 2021-02-19 DIAGNOSIS — S233XXA Sprain of ligaments of thoracic spine, initial encounter: Secondary | ICD-10-CM | POA: Diagnosis not present

## 2021-03-19 DIAGNOSIS — S134XXA Sprain of ligaments of cervical spine, initial encounter: Secondary | ICD-10-CM | POA: Diagnosis not present

## 2021-03-19 DIAGNOSIS — M9901 Segmental and somatic dysfunction of cervical region: Secondary | ICD-10-CM | POA: Diagnosis not present

## 2021-03-19 DIAGNOSIS — S233XXA Sprain of ligaments of thoracic spine, initial encounter: Secondary | ICD-10-CM | POA: Diagnosis not present

## 2021-03-19 DIAGNOSIS — M9902 Segmental and somatic dysfunction of thoracic region: Secondary | ICD-10-CM | POA: Diagnosis not present

## 2021-03-26 DIAGNOSIS — F4323 Adjustment disorder with mixed anxiety and depressed mood: Secondary | ICD-10-CM | POA: Diagnosis not present

## 2021-12-22 ENCOUNTER — Emergency Department (HOSPITAL_COMMUNITY): Payer: Medicare HMO

## 2021-12-22 ENCOUNTER — Other Ambulatory Visit: Payer: Self-pay

## 2021-12-22 ENCOUNTER — Encounter (HOSPITAL_COMMUNITY): Payer: Self-pay | Admitting: Emergency Medicine

## 2021-12-22 ENCOUNTER — Emergency Department (HOSPITAL_COMMUNITY)
Admission: EM | Admit: 2021-12-22 | Discharge: 2021-12-22 | Disposition: A | Discharge status: Home / Self Care | Payer: Medicare HMO | Attending: Emergency Medicine | Admitting: Emergency Medicine

## 2021-12-22 DIAGNOSIS — M79605 Pain in left leg: Secondary | ICD-10-CM | POA: Diagnosis not present

## 2021-12-22 DIAGNOSIS — I10 Essential (primary) hypertension: Secondary | ICD-10-CM | POA: Insufficient documentation

## 2021-12-22 DIAGNOSIS — M79652 Pain in left thigh: Secondary | ICD-10-CM | POA: Diagnosis not present

## 2021-12-22 DIAGNOSIS — M1712 Unilateral primary osteoarthritis, left knee: Secondary | ICD-10-CM | POA: Diagnosis not present

## 2021-12-22 MED ORDER — METHYLPREDNISOLONE 4 MG PO TBPK: ORAL_TABLET | ORAL | 0 refills | Status: DC

## 2021-12-22 NOTE — ED Provider Notes (Signed)
[facility] EMERGENCY DEPARTMENT Provider Note   CSN: [id number] Arrival date & time: 12/22/21  1212     History  Chief Complaint  Patient presents with   Leg Pain    Joseph Hogan is a 61 y.o. male with history of hypertension and prediabetes who presents the emergency department complaining of left thigh pain for the past 2 weeks.  Patient states that pain is made worse with movement and weightbearing, has no pain at rest.  He has been taking 400 mg of ibuprofen, and states this completely relieves his pain, but he is concerned when it returns.  History of arthritis in both knees.  Recently had a right lower leg injury, and thinks he may have been favoring the other leg while walking.  He has been having to use assistive devices for the past 2 days because of the pain.  Denies any numbness or tingling.  Denies any weakness.  Was sick with a URI several weeks ago, but has been feeling better.   Leg Pain Associated symptoms: no back pain        Home Medications Prior to Admission medications   Medication Sig Start Date End Date Taking? Authorizing Provider  Ascorbic Acid (VITAMIN C) 100 MG tablet Take 100 mg by mouth daily.   Yes [provider]  B Complex-C (B-COMPLEX WITH VITAMIN C) tablet Take 1 tablet by mouth daily.   Yes [provider]  cholecalciferol (VITAMIN D) 1000 units tablet Take 1,000 Units by mouth daily.   Yes [provider]  methylPREDNISolone (MEDROL DOSEPAK) 4 MG TBPK tablet Take per package instructions 12/22/21  Yes Carnelius Hammitt T, PA-C  Multiple Vitamin (MULTIVITAMIN WITH MINERALS) TABS tablet Take 1 tablet by mouth daily.   Yes [provider]  Multiple Vitamins-Minerals (ZINC PO) Take 1 tablet by mouth daily.   Yes [provider]  VITAMIN E PO Take 1 capsule by mouth daily.   Yes [provider]      Allergies    Codeine and Lisinopril    Review of Systems   Review of Systems   Musculoskeletal:  Positive for arthralgias. Negative for back pain.  Neurological:  Negative for weakness and numbness.  All other systems reviewed and are negative.   Physical Exam Updated Vital Signs BP (!) 173/81 (BP Location: Left Arm)   Pulse 73   Temp 97.9 F (36.6 C) (Oral)   Resp 17   Ht '5\' 11"'$  (1.803 m)   Wt 106.6 kg   SpO2 99%   BMI 32.78 kg/m  Physical Exam Vitals and nursing note reviewed.  Constitutional:      Appearance: Normal appearance.  HENT:     Head: Normocephalic and atraumatic.  Eyes:     Conjunctiva/sclera: Conjunctivae normal.  Cardiovascular:     Pulses:          Posterior tibial pulses are 2+ on the right side and 2+ on the left side.  Pulmonary:     Effort: Pulmonary effort is normal. No respiratory distress.  Musculoskeletal:     Right lower leg: No edema.     Left lower leg: No edema.     Comments: Normal range of motion of the left knee to flexion/extension, pain with left hip flexion.  No overlying skin changes to the entirety of the left leg, no increased warmth or breaks in the skin.  No calf tenderness or swelling.  Sensation intact in bilateral lower extremities.  Skin:    General:  Skin is warm and dry.     Comments: Superficial partially healed abrasion to the right shin  Neurological:     Mental Status: He is alert.  Psychiatric:        Mood and Affect: Mood normal.        Behavior: Behavior normal.     ED Results / Procedures / Treatments   Labs (all labs ordered are listed, but only abnormal results are displayed) Labs Reviewed - No data to display  EKG None  Radiology DG FEMUR MIN 2 VIEWS LEFT  Result Date: 12/22/2021 CLINICAL DATA:  Left thigh pain for 2 weeks, worse with movement and weight-bearing. EXAM: LEFT FEMUR 2 VIEWS COMPARISON:  06/01/2015, left knee radiographs. FINDINGS: No fracture.  No bone lesion. Hip joint normally spaced and aligned. Tricompartmental joint space narrowing of the knee with associated  marginal osteophytes, similar to the prior radiographs. No convincing joint effusion. Arterial vascular calcifications along the femoral artery. Soft tissues otherwise unremarkable. IMPRESSION: 1. No fracture or acute finding. 2. Moderate tricompartmental left knee osteoarthritis. Electronically Signed   By: Lajean Manes M.D.   On: 12/22/2021 13:26    Procedures Procedures    Medications Ordered in ED Medications - No data to display  ED Course/ Medical Decision Making/ A&P                           Medical Decision Making Amount and/or Complexity of Data Reviewed Radiology: ordered.  Risk Prescription drug management.   This patient is a 61 y.o. male  who presents to the ED for concern of left leg pain.   Differential diagnoses prior to evaluation: The emergent differential diagnosis includes, but is not limited to, fracture, dislocation, muscle strain, ligamentous injury, DVT, cellulitis, septic joint. This is not an exhaustive differential.   Past Medical History / Co-morbidities: hypertension and prediabetes  Physical Exam: Physical exam performed. The pertinent findings include: Hypertensive, otherwise normal vital signs.  Normal range of motion of the left knee, pain with left hip flexion.  No tenderness to palpation, no overlying skin changes.  No calf pain or swelling.  Neurovascularly intact in bilateral lower extremities.  Lab Tests/Imaging studies: I personally interpreted labs/imaging and the pertinent results include: Left femur x-ray shows tricompartmental osteoarthritis of the left knee, no other acute abnormalities. I agree with the radiologist interpretation.  Disposition: After consideration of the diagnostic results and the patients response to treatment, I feel that emergency department workup does not suggest an emergent condition requiring admission or immediate intervention beyond what has been performed at this time.  Suspect pain is related to muscle  strain in the setting of injury of the opposite leg, compounded with patient's known arthritis of the knee.  Low concern for acute inflammatory process such as bursitis, as patient has no tenderness of the hip joint itself.  Low concern for DVT, very low concern for septic joint.  The plan is: Discharge home with Medrol Dosepak and encourage orthopedic follow-up.  Explained to the patient that he can alternate different anti-inflammatories to avoid taking too much of one. The patient is safe for discharge and has been instructed to return immediately for worsening symptoms, change in symptoms or any other concerns.   Final Clinical Impression(s) / ED Diagnoses Final diagnoses:  Left leg pain    Rx / DC Orders ED Discharge Orders          Ordered    methylPREDNISolone (MEDROL  DOSEPAK) 4 MG TBPK tablet        12/22/21 1350           Portions of this report may have been transcribed using voice recognition software. Every effort was made to ensure accuracy; however, inadvertent computerized transcription errors may be present.    Estill Cotta 12/22/21 1356    Wyvonnia Dusky, MD 12/22/21 1511

## 2021-12-22 NOTE — Discharge Instructions (Signed)
You were seen in the emergency department today for leg pain.  As we discussed your x-ray showed that you have fairly significant arthritis in your left knee.  I think this coupled with the injury you had to your other leg the other day, suspect your change in walking may be contributing to some pain in the upper leg/hip.  I recommend using heat as needed, alternating between ibuprofen and tylenol, and taking the prescribed steroids I sent to your pharmacy.  I have attached the contact information for the orthopedic doctor, for you to make a follow-up appointment.  Continue to monitor how you're doing and return to the ER for new or worsening symptoms.

## 2021-12-22 NOTE — ED Notes (Signed)
Pt returned from radiology.

## 2021-12-22 NOTE — ED Triage Notes (Signed)
Pt via POV c/o left thigh pain x 2 weeks, worse with movement and weight bearing. Pt has treated at home with '400mg'$  ibuprofen with some relief. Reports hx arthritis in both knees. Pain rated 8/10 when standing and pt has a new requirement for assistive devices (cane/walker) x 2 days.

## 2021-12-26 ENCOUNTER — Telehealth: Payer: Self-pay

## 2021-12-26 NOTE — Telephone Encounter (Signed)
     Patient  visit on 11/11  at [place] you been able to follow up with your primary care physician? Yes   The patient was or was not able to obtain any needed medicine or equipment. Yes   Are there diet recommendations that you are having difficulty following? Na   Patient expresses understanding of discharge instructions and education provided has no other needs at this time.  Yes     [place], [facility], Care Management  [phone] [address], [place], [place] [address] Phone: [phone] Email: [email or web address]

## 2021-12-28 ENCOUNTER — Telehealth: Payer: Self-pay

## 2021-12-28 NOTE — Telephone Encounter (Signed)
Patient left message on vm about wanting to schedule an appointment. He wanted to know how before he could be seen, but never said his problem or which doctor I returned his call but had to leave a message on vm to call our office back.

## 2022-01-02 DIAGNOSIS — M5442 Lumbago with sciatica, left side: Secondary | ICD-10-CM | POA: Diagnosis not present

## 2022-01-02 DIAGNOSIS — M9905 Segmental and somatic dysfunction of pelvic region: Secondary | ICD-10-CM | POA: Diagnosis not present

## 2022-01-02 DIAGNOSIS — M9902 Segmental and somatic dysfunction of thoracic region: Secondary | ICD-10-CM | POA: Diagnosis not present

## 2022-01-02 DIAGNOSIS — M9903 Segmental and somatic dysfunction of lumbar region: Secondary | ICD-10-CM | POA: Diagnosis not present

## 2022-01-07 DIAGNOSIS — M9905 Segmental and somatic dysfunction of pelvic region: Secondary | ICD-10-CM | POA: Diagnosis not present

## 2022-01-07 DIAGNOSIS — M5442 Lumbago with sciatica, left side: Secondary | ICD-10-CM | POA: Diagnosis not present

## 2022-01-07 DIAGNOSIS — M9903 Segmental and somatic dysfunction of lumbar region: Secondary | ICD-10-CM | POA: Diagnosis not present

## 2022-01-07 DIAGNOSIS — M9902 Segmental and somatic dysfunction of thoracic region: Secondary | ICD-10-CM | POA: Diagnosis not present

## 2022-01-11 DIAGNOSIS — M9905 Segmental and somatic dysfunction of pelvic region: Secondary | ICD-10-CM | POA: Diagnosis not present

## 2022-01-11 DIAGNOSIS — M9903 Segmental and somatic dysfunction of lumbar region: Secondary | ICD-10-CM | POA: Diagnosis not present

## 2022-01-11 DIAGNOSIS — M5442 Lumbago with sciatica, left side: Secondary | ICD-10-CM | POA: Diagnosis not present

## 2022-01-11 DIAGNOSIS — M9902 Segmental and somatic dysfunction of thoracic region: Secondary | ICD-10-CM | POA: Diagnosis not present

## 2022-01-14 DIAGNOSIS — M9903 Segmental and somatic dysfunction of lumbar region: Secondary | ICD-10-CM | POA: Diagnosis not present

## 2022-01-14 DIAGNOSIS — M5442 Lumbago with sciatica, left side: Secondary | ICD-10-CM | POA: Diagnosis not present

## 2022-01-14 DIAGNOSIS — M9902 Segmental and somatic dysfunction of thoracic region: Secondary | ICD-10-CM | POA: Diagnosis not present

## 2022-01-14 DIAGNOSIS — M9905 Segmental and somatic dysfunction of pelvic region: Secondary | ICD-10-CM | POA: Diagnosis not present

## 2022-01-16 DIAGNOSIS — M9905 Segmental and somatic dysfunction of pelvic region: Secondary | ICD-10-CM | POA: Diagnosis not present

## 2022-01-16 DIAGNOSIS — M9903 Segmental and somatic dysfunction of lumbar region: Secondary | ICD-10-CM | POA: Diagnosis not present

## 2022-01-16 DIAGNOSIS — M5442 Lumbago with sciatica, left side: Secondary | ICD-10-CM | POA: Diagnosis not present

## 2022-01-16 DIAGNOSIS — M9902 Segmental and somatic dysfunction of thoracic region: Secondary | ICD-10-CM | POA: Diagnosis not present

## 2022-01-21 DIAGNOSIS — M5442 Lumbago with sciatica, left side: Secondary | ICD-10-CM | POA: Diagnosis not present

## 2022-01-21 DIAGNOSIS — M9903 Segmental and somatic dysfunction of lumbar region: Secondary | ICD-10-CM | POA: Diagnosis not present

## 2022-01-21 DIAGNOSIS — M9902 Segmental and somatic dysfunction of thoracic region: Secondary | ICD-10-CM | POA: Diagnosis not present

## 2022-01-21 DIAGNOSIS — M9905 Segmental and somatic dysfunction of pelvic region: Secondary | ICD-10-CM | POA: Diagnosis not present

## 2022-01-30 DIAGNOSIS — M5442 Lumbago with sciatica, left side: Secondary | ICD-10-CM | POA: Diagnosis not present

## 2022-01-30 DIAGNOSIS — M9902 Segmental and somatic dysfunction of thoracic region: Secondary | ICD-10-CM | POA: Diagnosis not present

## 2022-01-30 DIAGNOSIS — M9905 Segmental and somatic dysfunction of pelvic region: Secondary | ICD-10-CM | POA: Diagnosis not present

## 2022-01-30 DIAGNOSIS — M9903 Segmental and somatic dysfunction of lumbar region: Secondary | ICD-10-CM | POA: Diagnosis not present

## 2022-02-01 DIAGNOSIS — M9902 Segmental and somatic dysfunction of thoracic region: Secondary | ICD-10-CM | POA: Diagnosis not present

## 2022-02-01 DIAGNOSIS — M9905 Segmental and somatic dysfunction of pelvic region: Secondary | ICD-10-CM | POA: Diagnosis not present

## 2022-02-01 DIAGNOSIS — M5442 Lumbago with sciatica, left side: Secondary | ICD-10-CM | POA: Diagnosis not present

## 2022-02-01 DIAGNOSIS — M9903 Segmental and somatic dysfunction of lumbar region: Secondary | ICD-10-CM | POA: Diagnosis not present

## 2022-02-03 ENCOUNTER — Emergency Department (HOSPITAL_COMMUNITY): Payer: Medicare HMO

## 2022-02-03 ENCOUNTER — Encounter (HOSPITAL_COMMUNITY): Payer: Self-pay | Admitting: Emergency Medicine

## 2022-02-03 ENCOUNTER — Other Ambulatory Visit: Payer: Self-pay

## 2022-02-03 ENCOUNTER — Inpatient Hospital Stay (HOSPITAL_COMMUNITY)
Admission: EM | Admit: 2022-02-03 | Discharge: 2022-02-12 | DRG: 522 | Disposition: A | Discharge status: Home Health | Payer: Medicare HMO | Attending: Internal Medicine | Admitting: Internal Medicine

## 2022-02-03 DIAGNOSIS — Z811 Family history of alcohol abuse and dependence: Secondary | ICD-10-CM

## 2022-02-03 DIAGNOSIS — Z823 Family history of stroke: Secondary | ICD-10-CM | POA: Diagnosis not present

## 2022-02-03 DIAGNOSIS — E119 Type 2 diabetes mellitus without complications: Secondary | ICD-10-CM | POA: Diagnosis not present

## 2022-02-03 DIAGNOSIS — M4726 Other spondylosis with radiculopathy, lumbar region: Secondary | ICD-10-CM | POA: Diagnosis present

## 2022-02-03 DIAGNOSIS — Z6833 Body mass index (BMI) 33.0-33.9, adult: Secondary | ICD-10-CM

## 2022-02-03 DIAGNOSIS — Z471 Aftercare following joint replacement surgery: Secondary | ICD-10-CM | POA: Diagnosis not present

## 2022-02-03 DIAGNOSIS — Z820 Family history of epilepsy and other diseases of the nervous system: Secondary | ICD-10-CM

## 2022-02-03 DIAGNOSIS — Z885 Allergy status to narcotic agent status: Secondary | ICD-10-CM

## 2022-02-03 DIAGNOSIS — C801 Malignant (primary) neoplasm, unspecified: Secondary | ICD-10-CM | POA: Diagnosis not present

## 2022-02-03 DIAGNOSIS — C9000 Multiple myeloma not having achieved remission: Secondary | ICD-10-CM | POA: Diagnosis not present

## 2022-02-03 DIAGNOSIS — S72009A Fracture of unspecified part of neck of unspecified femur, initial encounter for closed fracture: Secondary | ICD-10-CM | POA: Diagnosis present

## 2022-02-03 DIAGNOSIS — Z8249 Family history of ischemic heart disease and other diseases of the circulatory system: Secondary | ICD-10-CM

## 2022-02-03 DIAGNOSIS — M25552 Pain in left hip: Secondary | ICD-10-CM | POA: Diagnosis not present

## 2022-02-03 DIAGNOSIS — S72012A Unspecified intracapsular fracture of left femur, initial encounter for closed fracture: Secondary | ICD-10-CM | POA: Diagnosis not present

## 2022-02-03 DIAGNOSIS — M84552A Pathological fracture in neoplastic disease, left femur, initial encounter for fracture: Secondary | ICD-10-CM | POA: Diagnosis not present

## 2022-02-03 DIAGNOSIS — W1809XA Striking against other object with subsequent fall, initial encounter: Secondary | ICD-10-CM | POA: Diagnosis present

## 2022-02-03 DIAGNOSIS — S8992XA Unspecified injury of left lower leg, initial encounter: Secondary | ICD-10-CM | POA: Diagnosis not present

## 2022-02-03 DIAGNOSIS — Z96642 Presence of left artificial hip joint: Secondary | ICD-10-CM | POA: Diagnosis not present

## 2022-02-03 DIAGNOSIS — Z888 Allergy status to other drugs, medicaments and biological substances status: Secondary | ICD-10-CM

## 2022-02-03 DIAGNOSIS — I10 Essential (primary) hypertension: Secondary | ICD-10-CM | POA: Diagnosis not present

## 2022-02-03 DIAGNOSIS — Z79899 Other long term (current) drug therapy: Secondary | ICD-10-CM | POA: Diagnosis not present

## 2022-02-03 DIAGNOSIS — I444 Left anterior fascicular block: Secondary | ICD-10-CM

## 2022-02-03 DIAGNOSIS — M7989 Other specified soft tissue disorders: Secondary | ICD-10-CM | POA: Diagnosis not present

## 2022-02-03 DIAGNOSIS — Y9301 Activity, walking, marching and hiking: Secondary | ICD-10-CM | POA: Diagnosis present

## 2022-02-03 DIAGNOSIS — M899 Disorder of bone, unspecified: Secondary | ICD-10-CM | POA: Diagnosis not present

## 2022-02-03 DIAGNOSIS — Z809 Family history of malignant neoplasm, unspecified: Secondary | ICD-10-CM | POA: Diagnosis not present

## 2022-02-03 DIAGNOSIS — G8911 Acute pain due to trauma: Secondary | ICD-10-CM | POA: Diagnosis not present

## 2022-02-03 DIAGNOSIS — Y92239 Unspecified place in hospital as the place of occurrence of the external cause: Secondary | ICD-10-CM | POA: Diagnosis not present

## 2022-02-03 DIAGNOSIS — S72002A Fracture of unspecified part of neck of left femur, initial encounter for closed fracture: Secondary | ICD-10-CM | POA: Diagnosis not present

## 2022-02-03 DIAGNOSIS — Z28310 Unvaccinated for covid-19: Secondary | ICD-10-CM

## 2022-02-03 DIAGNOSIS — G8929 Other chronic pain: Secondary | ICD-10-CM | POA: Diagnosis present

## 2022-02-03 DIAGNOSIS — M5416 Radiculopathy, lumbar region: Secondary | ICD-10-CM | POA: Insufficient documentation

## 2022-02-03 DIAGNOSIS — Z8614 Personal history of Methicillin resistant Staphylococcus aureus infection: Secondary | ICD-10-CM | POA: Diagnosis not present

## 2022-02-03 DIAGNOSIS — D630 Anemia in neoplastic disease: Secondary | ICD-10-CM | POA: Diagnosis not present

## 2022-02-03 DIAGNOSIS — J9811 Atelectasis: Secondary | ICD-10-CM | POA: Diagnosis not present

## 2022-02-03 DIAGNOSIS — K5903 Drug induced constipation: Secondary | ICD-10-CM | POA: Diagnosis not present

## 2022-02-03 DIAGNOSIS — E669 Obesity, unspecified: Secondary | ICD-10-CM | POA: Diagnosis present

## 2022-02-03 DIAGNOSIS — C7951 Secondary malignant neoplasm of bone: Secondary | ICD-10-CM | POA: Diagnosis not present

## 2022-02-03 DIAGNOSIS — N281 Cyst of kidney, acquired: Secondary | ICD-10-CM | POA: Diagnosis not present

## 2022-02-03 DIAGNOSIS — K3189 Other diseases of stomach and duodenum: Secondary | ICD-10-CM | POA: Diagnosis not present

## 2022-02-03 DIAGNOSIS — M79605 Pain in left leg: Secondary | ICD-10-CM | POA: Diagnosis not present

## 2022-02-03 DIAGNOSIS — M8458XA Pathological fracture in neoplastic disease, other specified site, initial encounter for fracture: Secondary | ICD-10-CM | POA: Diagnosis not present

## 2022-02-03 DIAGNOSIS — W19XXXA Unspecified fall, initial encounter: Secondary | ICD-10-CM | POA: Diagnosis not present

## 2022-02-03 DIAGNOSIS — D72829 Elevated white blood cell count, unspecified: Secondary | ICD-10-CM | POA: Diagnosis not present

## 2022-02-03 DIAGNOSIS — D649 Anemia, unspecified: Secondary | ICD-10-CM | POA: Diagnosis not present

## 2022-02-03 DIAGNOSIS — M84452A Pathological fracture, left femur, initial encounter for fracture: Secondary | ICD-10-CM | POA: Diagnosis not present

## 2022-02-03 DIAGNOSIS — T40605A Adverse effect of unspecified narcotics, initial encounter: Secondary | ICD-10-CM | POA: Diagnosis not present

## 2022-02-03 DIAGNOSIS — M87852 Other osteonecrosis, left femur: Secondary | ICD-10-CM | POA: Diagnosis not present

## 2022-02-03 HISTORY — DX: Left anterior fascicular block: I44.4

## 2022-02-03 HISTORY — DX: Unspecified osteoarthritis, unspecified site: M19.90

## 2022-02-03 LAB — CBC WITH DIFFERENTIAL/PLATELET
Abs Immature Granulocytes: 0.07 10*3/uL (ref 0.00–0.07)
Basophils Absolute: 0.1 10*3/uL (ref 0.0–0.1)
Basophils Relative: 0 %
Eosinophils Absolute: 0 10*3/uL (ref 0.0–0.5)
Eosinophils Relative: 0 %
HCT: 37.7 % — ABNORMAL LOW (ref 39.0–52.0)
Hemoglobin: 12.8 g/dL — ABNORMAL LOW (ref 13.0–17.0)
Immature Granulocytes: 1 %
Lymphocytes Relative: 5 %
Lymphs Abs: 0.6 10*3/uL — ABNORMAL LOW (ref 0.7–4.0)
MCH: 28.5 pg (ref 26.0–34.0)
MCHC: 34 g/dL (ref 30.0–36.0)
MCV: 84 fL (ref 80.0–100.0)
Monocytes Absolute: 0.6 10*3/uL (ref 0.1–1.0)
Monocytes Relative: 5 %
Neutro Abs: 11.6 10*3/uL — ABNORMAL HIGH (ref 1.7–7.7)
Neutrophils Relative %: 89 %
Platelets: 418 10*3/uL — ABNORMAL HIGH (ref 150–400)
RBC: 4.49 MIL/uL (ref 4.22–5.81)
RDW: 12.6 % (ref 11.5–15.5)
WBC: 13 10*3/uL — ABNORMAL HIGH (ref 4.0–10.5)
nRBC: 0 % (ref 0.0–0.2)

## 2022-02-03 LAB — TYPE AND SCREEN
ABO/RH(D): O POS
Antibody Screen: NEGATIVE

## 2022-02-03 LAB — BASIC METABOLIC PANEL
Anion gap: 12 (ref 5–15)
BUN: 16 mg/dL (ref 8–23)
CO2: 23 mmol/L (ref 22–32)
Calcium: 9 mg/dL (ref 8.9–10.3)
Chloride: 103 mmol/L (ref 98–111)
Creatinine, Ser: 0.82 mg/dL (ref 0.61–1.24)
GFR, Estimated: >60 mL/min (ref >60)
Glucose, Bld: 178 mg/dL — ABNORMAL HIGH (ref 70–99)
Potassium: 4 mmol/L (ref 3.5–5.1)
Sodium: 138 mmol/L (ref 135–145)

## 2022-02-03 LAB — PROTIME-INR
INR: 1.2 (ref 0.8–1.2)
Prothrombin Time: 14.6 seconds (ref 11.4–15.2)

## 2022-02-03 MED ORDER — ONDANSETRON HCL 4 MG PO TABS: 4.0000 mg | ORAL_TABLET | Freq: Four times a day (QID) | ORAL | Status: DC | PRN

## 2022-02-03 MED ORDER — FENTANYL CITRATE (PF) 100 MCG/2ML IJ SOLN
100.0000 ug | Freq: Once | INTRAMUSCULAR | Status: AC
  Administered 2022-02-03: 100 ug via INTRAVENOUS
  Filled 2022-02-03: qty 2

## 2022-02-03 MED ORDER — KETAMINE HCL 50 MG/5ML IJ SOSY
0.3000 mg/kg | PREFILLED_SYRINGE | Freq: Once | INTRAMUSCULAR | Status: AC
  Administered 2022-02-03: 33 mg via INTRAVENOUS
  Filled 2022-02-03: qty 5

## 2022-02-03 MED ORDER — CYCLOBENZAPRINE HCL 10 MG PO TABS
10.0000 mg | ORAL_TABLET | Freq: Every day | ORAL | Status: DC
  Administered 2022-02-03 – 2022-02-04 (×2): 10 mg via ORAL
  Filled 2022-02-03 (×2): qty 1

## 2022-02-03 MED ORDER — ACETAMINOPHEN 325 MG PO TABS
650.0000 mg | ORAL_TABLET | Freq: Four times a day (QID) | ORAL | Status: DC | PRN
  Administered 2022-02-04 – 2022-02-12 (×4): 650 mg via ORAL
  Filled 2022-02-03 (×4): qty 2

## 2022-02-03 MED ORDER — ACETAMINOPHEN 650 MG RE SUPP: 650.0000 mg | Freq: Four times a day (QID) | RECTAL | Status: DC | PRN

## 2022-02-03 MED ORDER — ADULT MULTIVITAMIN W/MINERALS CH
1.0000 | ORAL_TABLET | Freq: Every day | ORAL | Status: DC
  Administered 2022-02-03 – 2022-02-12 (×10): 1 via ORAL
  Filled 2022-02-03 (×10): qty 1

## 2022-02-03 MED ORDER — SENNOSIDES-DOCUSATE SODIUM 8.6-50 MG PO TABS
1.0000 | ORAL_TABLET | Freq: Every evening | ORAL | Status: DC | PRN
  Administered 2022-02-04 – 2022-02-07 (×2): 1 via ORAL
  Filled 2022-02-03 (×2): qty 1

## 2022-02-03 MED ORDER — GABAPENTIN 300 MG PO CAPS
300.0000 mg | ORAL_CAPSULE | Freq: Three times a day (TID) | ORAL | Status: DC
  Administered 2022-02-03 – 2022-02-12 (×25): 300 mg via ORAL
  Filled 2022-02-03 (×26): qty 1

## 2022-02-03 MED ORDER — VITAMIN C 100 MG PO TABS: 100.0000 mg | ORAL_TABLET | Freq: Every day | ORAL | Status: DC

## 2022-02-03 MED ORDER — VITAMIN C 500 MG PO TABS
250.0000 mg | ORAL_TABLET | Freq: Every day | ORAL | Status: DC
  Administered 2022-02-04 – 2022-02-12 (×9): 250 mg via ORAL
  Filled 2022-02-03 (×9): qty 1

## 2022-02-03 MED ORDER — HYDROMORPHONE HCL 1 MG/ML IJ SOLN
1.0000 mg | Freq: Once | INTRAMUSCULAR | Status: AC
  Administered 2022-02-03: 1 mg via INTRAVENOUS
  Filled 2022-02-03: qty 1

## 2022-02-03 MED ORDER — FENTANYL CITRATE PF 50 MCG/ML IJ SOSY
50.0000 ug | PREFILLED_SYRINGE | INTRAMUSCULAR | Status: DC | PRN
  Administered 2022-02-03 – 2022-02-06 (×6): 50 ug via INTRAVENOUS
  Filled 2022-02-03 (×7): qty 1

## 2022-02-03 MED ORDER — HYDROMORPHONE HCL 1 MG/ML IJ SOLN
1.0000 mg | INTRAMUSCULAR | Status: AC | PRN
  Administered 2022-02-03 (×2): 1 mg via INTRAVENOUS
  Filled 2022-02-03 (×2): qty 1

## 2022-02-03 MED ORDER — HEPARIN SODIUM (PORCINE) 5000 UNIT/ML IJ SOLN
5000.0000 [IU] | Freq: Three times a day (TID) | INTRAMUSCULAR | Status: DC
  Administered 2022-02-03 – 2022-02-06 (×6): 5000 [IU] via SUBCUTANEOUS
  Filled 2022-02-03 (×7): qty 1

## 2022-02-03 MED ORDER — KETOROLAC TROMETHAMINE 15 MG/ML IJ SOLN
15.0000 mg | Freq: Once | INTRAMUSCULAR | Status: AC
  Administered 2022-02-03: 15 mg via INTRAVENOUS
  Filled 2022-02-03: qty 1

## 2022-02-03 MED ORDER — CYCLOBENZAPRINE HCL 5 MG PO TABS
7.5000 mg | ORAL_TABLET | Freq: Every day | ORAL | Status: DC
  Filled 2022-02-03: qty 1.5

## 2022-02-03 MED ORDER — B COMPLEX-C PO TABS
1.0000 | ORAL_TABLET | Freq: Every day | ORAL | Status: DC
  Administered 2022-02-03 – 2022-02-12 (×10): 1 via ORAL
  Filled 2022-02-03 (×10): qty 1

## 2022-02-03 MED ORDER — ONDANSETRON HCL 4 MG/2ML IJ SOLN: 4.0000 mg | Freq: Four times a day (QID) | INTRAMUSCULAR | Status: DC | PRN

## 2022-02-03 MED ORDER — VITAMIN D 25 MCG (1000 UNIT) PO TABS
1000.0000 [IU] | ORAL_TABLET | Freq: Every day | ORAL | Status: DC
  Administered 2022-02-03 – 2022-02-12 (×10): 1000 [IU] via ORAL
  Filled 2022-02-03 (×10): qty 1

## 2022-02-03 MED ORDER — OXYCODONE HCL 5 MG PO TABS
5.0000 mg | ORAL_TABLET | ORAL | Status: DC | PRN
  Administered 2022-02-04 (×4): 5 mg via ORAL
  Filled 2022-02-03 (×4): qty 1

## 2022-02-03 NOTE — ED Triage Notes (Signed)
Patient brought in via EMS from home. Alert and oriented. Airway patent. Per patient ripped coming through door of house. Patient c/o left hip pain. Unable to assess for shortening or rotation at this time due to patients pain level and inability to tolerate straitening leg at this time.

## 2022-02-03 NOTE — H&P (Signed)
History and Physical    Patient: Joseph Hogan WIO:[id number] DOB: 1960-07-07 DOA: 02/03/2022 DOS: the patient was seen and examined on 02/03/2022 PCP: Joseph Douglas, MD  Patient coming from: Home  Chief Complaint:  Chief Complaint  Patient presents with   Fall   HPI: Joseph Hogan is a 61 y.o. male with medical history significant of arthritis, lumbar radiculopathy, hypertension, prediabetes, not taking any medications, but instead Taking natural remedies, presents to ED with a chief complaint of hip pain.  Patient reports that he has been ambulating with a walker secondary to lumbar radicular pain.  He was trying to go up into his home with a walker when he had a mechanical slip and fall.  Patient denies any chest pain, shortness of breath, dizziness prior to his fall.  He denies hitting his head.  He denies being on any blood thinners.  He did not lose consciousness.  It was a straightforward simple fall.  He had severe hip pain afterwards.  Pain goes from his groin to his knee.  This is the same distribution as his lumbar radicular pain, so he reports it is a double pain.  His pain has been very hard to control the ED.  He has had multiple doses of Dilaudid, he had a dose of fentanyl, dose of ketamine, dose of Toradol.  He reports nothing is touching his pain, and he is hypertensive, but he appears as though his pain is managed.  Patient has no other complaints at this time.  Patient does not smoke, does not drink, does not use illicit drugs.  He is not vaccinated for COVID.  Patient is full code. Review of Systems: As mentioned in the history of present illness. All other systems reviewed and are negative. Past Medical History:  Diagnosis Date   Arthritis    Hypertension    MRSA (methicillin resistant Staphylococcus aureus)    Pre-diabetes    No past surgical history on file. Social History:  reports that he has never smoked. He has never used smokeless tobacco. He reports that  he does not drink alcohol and does not use drugs.  Allergies  Allergen Reactions   Codeine Nausea And Vomiting   Lisinopril Cough    Family History  Problem Relation Age of Onset   Stroke Mother    Hypertension Mother    Heart disease Father    Alzheimer's disease Father    Hypertension Brother    Alcohol abuse Brother    Cancer Maternal Aunt    Heart disease Maternal Aunt    Cancer Maternal Uncle    Heart disease Maternal Uncle    Cancer Paternal Aunt    Heart disease Paternal Aunt    Cancer Paternal Uncle    Heart disease Paternal Uncle    Heart disease Paternal Grandmother    Alzheimer's disease Paternal Grandfather     Prior to Admission medications   Medication Sig Start Date End Date Taking? Authorizing Provider  Ascorbic Acid (VITAMIN C) 100 MG tablet Take 100 mg by mouth daily.    [provider]  B Complex-C (B-COMPLEX WITH VITAMIN C) tablet Take 1 tablet by mouth daily.    [provider]  cholecalciferol (VITAMIN D) 1000 units tablet Take 1,000 Units by mouth daily.    [provider]  methylPREDNISolone (MEDROL DOSEPAK) 4 MG TBPK tablet Take per package instructions 12/22/21   Roemhildt, Lorin T, PA-C  Multiple Vitamin (MULTIVITAMIN WITH MINERALS) TABS tablet Take 1 tablet by mouth  daily.    [provider]  Multiple Vitamins-Minerals (ZINC PO) Take 1 tablet by mouth daily.    [provider]  VITAMIN E PO Take 1 capsule by mouth daily.    [provider]    Physical Exam: Vitals:   02/03/22 1900 02/03/22 1945 02/03/22 1952 02/03/22 2030  BP: (!) 165/98 (!) 201/108  (!) 179/88  Pulse: 96 (!) 108 97 92  Resp: (!) 24 (!) '21 16 16  '$ Temp:      TempSrc:      SpO2: 95% 98% 98% 97%  Weight:      Height:       1.  General: Patient lying supine in bed,  no acute distress   2. Psychiatric: Alert and oriented x 3, mood and behavior normal for situation, pleasant and cooperative with exam   3.  Neurologic: Speech and language are normal, face is symmetric, moves all 4 extremities voluntarily, at baseline without acute deficits on limited exam   4. HEENMT:  Head is atraumatic, normocephalic, pupils reactive to light, neck is supple, trachea is midline, mucous membranes are moist   5. Respiratory : Lungs are clear to auscultation bilaterally without wheezing, rhonchi, rales, no cyanosis, no increase in work of breathing or accessory muscle use   6. Cardiovascular : Heart rate normal, rhythm is regular, no murmurs, rubs or gallops, no peripheral edema, peripheral pulses palpated   7. Gastrointestinal:  Abdomen is soft, nondistended, nontender to palpation bowel sounds active, no masses or organomegaly palpated   8. Skin:  Skin is warm, dry and intact without rashes, acute lesions, or ulcers on limited exam   9.Musculoskeletal:  Left lower extremity is flexed, externally rotated, and AB ducted  Data Reviewed: In the ED Temp 98.1, heart rate 84-97, respiratory rate 18-26, blood pressure 149/84-165/98, satting 95% Ketamine 50 mg given, multiple doses of Dilaudid given fentanyl given, Toradol given Leukocytosis 13.0, hemoglobin stable 12.8 Patient typed and screened Chemistry is unremarkable aside from a hyperglycemia at 178 EKG shows a heart rate 97, sinus rhythm, QTc 448 Ortho consulted and recommended admission to one of the [facility] campuses for surgical repair Assessment and Plan: * Hip fracture ([facility]) - Mechanical slip and - CT left hip shows acute subcapital left femoral neck fracture - Ortho consulted and recommends admission to [facility] or [facility] - N.p.o. after midnight - Patient typed and screened - Pain control is been difficult for this patient with Dilaudid, fentanyl, Toradol, ketamine not touching his pain in the ER - Will continue with as needed fentanyl and add gabapentin given the radicular pain as well as Flexeril   Leukocytosis - 13.0 - Likely secondary to hip  fracture - No signs of infection - No urinary symptoms - No respiratory symptoms - Will continue to monitor  Lumbar radiculopathy - Patient reports that he is following with a chiropractor for lumbar radiculopathy and supposed to be getting in with pain management/Ortho soon regarding this problem - Start gabapentin - Continue with above-noted pain control      Advance Care Planning:   Code Status: Full Code   Consults: Ortho  Family Communication: Daughters at bedside  Severity of Illness: The appropriate patient status for this patient is INPATIENT. Inpatient status is judged to be reasonable and necessary in order to provide the required intensity of service to ensure the patient's safety. The patient's presenting symptoms, physical exam findings, and initial radiographic and laboratory data in the context of their chronic comorbidities is felt  to place them at high risk for further clinical deterioration. Furthermore, it is not anticipated that the patient will be medically stable for discharge from the hospital within 2 midnights of admission.   * I certify that at the point of admission it is my clinical judgment that the patient will require inpatient hospital care spanning beyond 2 midnights from the point of admission due to high intensity of service, high risk for further deterioration and high frequency of surveillance required.*  Author: Rolla Plate, DO 02/03/2022 9:04 PM  For on call review [email or web address].

## 2022-02-03 NOTE — Assessment & Plan Note (Signed)
-   Patient reports that he is following with a chiropractor for lumbar radiculopathy and supposed to be getting in with pain management/Ortho soon regarding this problem - Start gabapentin - Continue with above-noted pain control

## 2022-02-03 NOTE — ED Provider Notes (Signed)
[facility] EMERGENCY DEPARTMENT Provider Note   CSN: [id number] Arrival date & time: 02/03/22  1527     History  Chief Complaint  Patient presents with   Joseph Hogan is a 61 y.o. male.  HPI 61 year old male with a history of chronic back pain who uses a walker presents after he tripped with his walker.  Now has left hip pain.  Unable to walk and the pain is currently severe.  No numbness or weakness.  Did not hit his head.  No blood thinner use.  No new back pain or abdominal pain.  Home Medications Prior to Admission medications   Medication Sig Start Date End Date Taking? Authorizing Provider  Ascorbic Acid (VITAMIN C) 100 MG tablet Take 100 mg by mouth daily.    [provider]  B Complex-C (B-COMPLEX WITH VITAMIN C) tablet Take 1 tablet by mouth daily.    [provider]  cholecalciferol (VITAMIN D) 1000 units tablet Take 1,000 Units by mouth daily.    [provider]  methylPREDNISolone (MEDROL DOSEPAK) 4 MG TBPK tablet Take per package instructions 12/22/21   Roemhildt, Lorin T, PA-C  Multiple Vitamin (MULTIVITAMIN WITH MINERALS) TABS tablet Take 1 tablet by mouth daily.    [provider]  Multiple Vitamins-Minerals (ZINC PO) Take 1 tablet by mouth daily.    [provider]  VITAMIN E PO Take 1 capsule by mouth daily.    [provider]      Allergies    Codeine and Lisinopril    Review of Systems   Review of Systems  Gastrointestinal:  Negative for abdominal pain.  Musculoskeletal:  Positive for arthralgias. Negative for back pain.  Neurological:  Negative for weakness and numbness.    Physical Exam Updated Vital Signs BP (!) 179/88   Pulse 92   Temp 98.1 F (36.7 C) (Oral)   Resp 16   Ht '5\' 11"'$  (1.803 m)   Wt 108.9 kg   SpO2 97%   BMI 33.47 kg/m  Physical Exam Vitals and nursing note reviewed.  Constitutional:      Appearance: He is well-developed.  HENT:     Head: Normocephalic and  atraumatic.  Cardiovascular:     Rate and Rhythm: Normal rate and regular rhythm.     Pulses:          Dorsalis pedis pulses are 2+ on the left side.  Pulmonary:     Effort: Pulmonary effort is normal.     Breath sounds: Normal breath sounds.  Abdominal:     General: There is no distension.     Palpations: Abdomen is soft.     Tenderness: There is no abdominal tenderness.  Musculoskeletal:     Left hip: Deformity and tenderness present. Decreased range of motion.     Left upper leg: No tenderness.     Left knee: No swelling. No tenderness.     Comments: Normal strength/sensation in left foot  Skin:    General: Skin is warm and dry.  Neurological:     Mental Status: He is alert.     ED Results / Procedures / Treatments   Labs (all labs ordered are listed, but only abnormal results are displayed) Labs Reviewed  BASIC METABOLIC PANEL - Abnormal; Notable for the following components:      Result Value   Glucose, Bld 178 (*)    All other components within normal limits  CBC WITH DIFFERENTIAL/PLATELET - Abnormal; Notable for  the following components:   WBC 13.0 (*)    Hemoglobin 12.8 (*)    HCT 37.7 (*)    Platelets 418 (*)    Neutro Abs 11.6 (*)    Lymphs Abs 0.6 (*)    All other components within normal limits  PROTIME-INR  TYPE AND SCREEN    EKG EKG Interpretation  Date/Time:  Sunday February 03 2022 18:10:03 EST Ventricular Rate:  97 PR Interval:  174 QRS Duration: 102 QT Interval:  352 QTC Calculation: 448 R Axis:   -64 Text Interpretation: Sinus rhythm Probable left atrial enlargement Left anterior fascicular block Abnormal R-wave progression, late transition Confirmed by Sherwood Gambler [phone]) on 02/03/2022 6:44:29 PM  Radiology DG Hip Unilat W or Wo Pelvis 2-3 Views Left  Result Date: 02/03/2022 CLINICAL DATA:  Left hip pain after fall EXAM: DG HIP (WITH OR WITHOUT PELVIS) 2-3V LEFT COMPARISON:  12/22/2021 FINDINGS: Acute displaced subcapital left  femoral neck fracture. The distal femoral shaft is displaced superiorly and laterally. The left femoral head remains seated within the left acetabulum. IMPRESSION: Acute subcapital left femoral neck fracture. Electronically Signed   By: Placido Sou M.D.   On: 02/03/2022 17:31    Procedures Procedures    Medications Ordered in ED Medications  HYDROmorphone (DILAUDID) injection 1 mg (1 mg Intravenous Given 02/03/22 1804)  HYDROmorphone (DILAUDID) injection 1 mg (1 mg Intravenous Given 02/03/22 1546)  fentaNYL (SUBLIMAZE) injection 100 mcg (100 mcg Intravenous Given 02/03/22 1623)  HYDROmorphone (DILAUDID) injection 1 mg (1 mg Intravenous Given 02/03/22 1653)  ketorolac (TORADOL) 15 MG/ML injection 15 mg (15 mg Intravenous Given 02/03/22 1840)  ketamine 50 mg in normal saline 5 mL (10 mg/mL) syringe (33 mg Intravenous Given 02/03/22 1939)    ED Course/ Medical Decision Making/ A&P                           Medical Decision Making Amount and/or Complexity of Data Reviewed Labs: ordered.    Details: Mild hyperglycemia.  Leukocytosis is probably reactive.  Mild anemia. Radiology: ordered and independent interpretation performed.    Details: Closed left hip fracture. ECG/medicine tests: ordered and independent interpretation performed.    Details: No acute ischemia  Risk Prescription drug management. Decision regarding hospitalization.   Patient is neurovascular intact.  He is having a fair amount of pain and so was given multiple different doses of Dilaudid as well as Toradol and then later some IV ketamine.  Discussed case with Ortho on-call, Dr. Kathaleen Bury.  No surgery tonight and his will be a little bit more of a complex case and so orthopedics will need to be reconsulted in the morning to help assist with which provider will do the case.  Keep n.p.o. after midnight.  He will still need to go to either [facility] or [facility].  Discussed with hospitalist, Dr. Clearence Ped for  admission.        Final Clinical Impression(s) / ED Diagnoses Final diagnoses:  Closed fracture of left hip, initial encounter [facility])    Rx / DC Orders ED Discharge Orders     None         Sherwood Gambler, MD 02/03/22 2041

## 2022-02-03 NOTE — Assessment & Plan Note (Signed)
-   13.0 - Likely secondary to hip fracture - No signs of infection - No urinary symptoms - No respiratory symptoms - Will continue to monitor

## 2022-02-03 NOTE — Assessment & Plan Note (Signed)
-   Mechanical slip and - CT left hip shows acute subcapital left femoral neck fracture - Ortho consulted and recommends admission to [facility] or [facility] - N.p.o. after midnight - Patient typed and screened - Pain control is been difficult for this patient with Dilaudid, fentanyl, Toradol, ketamine not touching his pain in the ER - Will continue with as needed fentanyl and add gabapentin given the radicular pain as well as Flexeril

## 2022-02-04 ENCOUNTER — Inpatient Hospital Stay (HOSPITAL_COMMUNITY): Payer: Medicare HMO

## 2022-02-04 DIAGNOSIS — S72002A Fracture of unspecified part of neck of left femur, initial encounter for closed fracture: Secondary | ICD-10-CM | POA: Diagnosis not present

## 2022-02-04 LAB — COMPREHENSIVE METABOLIC PANEL
ALT: 18 U/L (ref 0–44)
AST: 24 U/L (ref 15–41)
Albumin: 3.2 g/dL — ABNORMAL LOW (ref 3.5–5.0)
Alkaline Phosphatase: 100 U/L (ref 38–126)
Anion gap: 8 (ref 5–15)
BUN: 17 mg/dL (ref 8–23)
CO2: 24 mmol/L (ref 22–32)
Calcium: 8.8 mg/dL — ABNORMAL LOW (ref 8.9–10.3)
Chloride: 102 mmol/L (ref 98–111)
Creatinine, Ser: 0.79 mg/dL (ref 0.61–1.24)
GFR, Estimated: >60 mL/min (ref >60)
Glucose, Bld: 145 mg/dL — ABNORMAL HIGH (ref 70–99)
Potassium: 4.1 mmol/L (ref 3.5–5.1)
Sodium: 134 mmol/L — ABNORMAL LOW (ref 135–145)
Total Bilirubin: 0.3 mg/dL (ref 0.3–1.2)
Total Protein: 6.8 g/dL (ref 6.5–8.1)

## 2022-02-04 LAB — CBC WITH DIFFERENTIAL/PLATELET
Abs Immature Granulocytes: 0.04 10*3/uL (ref 0.00–0.07)
Basophils Absolute: 0 10*3/uL (ref 0.0–0.1)
Basophils Relative: 0 %
Eosinophils Absolute: 0 10*3/uL (ref 0.0–0.5)
Eosinophils Relative: 0 %
HCT: 35 % — ABNORMAL LOW (ref 39.0–52.0)
Hemoglobin: 11.9 g/dL — ABNORMAL LOW (ref 13.0–17.0)
Immature Granulocytes: 1 %
Lymphocytes Relative: 8 %
Lymphs Abs: 0.6 10*3/uL — ABNORMAL LOW (ref 0.7–4.0)
MCH: 28.3 pg (ref 26.0–34.0)
MCHC: 34 g/dL (ref 30.0–36.0)
MCV: 83.1 fL (ref 80.0–100.0)
Monocytes Absolute: 0.9 10*3/uL (ref 0.1–1.0)
Monocytes Relative: 11 %
Neutro Abs: 6.7 10*3/uL (ref 1.7–7.7)
Neutrophils Relative %: 80 %
Platelets: 360 10*3/uL (ref 150–400)
RBC: 4.21 MIL/uL — ABNORMAL LOW (ref 4.22–5.81)
RDW: 12.5 % (ref 11.5–15.5)
WBC: 8.3 10*3/uL (ref 4.0–10.5)
nRBC: 0 % (ref 0.0–0.2)

## 2022-02-04 LAB — HIV ANTIBODY (ROUTINE TESTING W REFLEX): HIV Screen 4th Generation wRfx: NONREACTIVE

## 2022-02-04 LAB — MAGNESIUM: Magnesium: 2 mg/dL (ref 1.7–2.4)

## 2022-02-04 LAB — TSH: TSH: 2.026 u[IU]/mL (ref 0.350–4.500)

## 2022-02-04 MED ORDER — OXYCODONE HCL 5 MG PO TABS
10.0000 mg | ORAL_TABLET | Freq: Once | ORAL | Status: AC
  Administered 2022-02-04: 10 mg via ORAL
  Filled 2022-02-04: qty 2

## 2022-02-04 MED ORDER — PROCHLORPERAZINE EDISYLATE 10 MG/2ML IJ SOLN
5.0000 mg | Freq: Once | INTRAMUSCULAR | Status: AC
  Administered 2022-02-04: 5 mg via INTRAVENOUS
  Filled 2022-02-04: qty 2

## 2022-02-04 MED ORDER — MENTHOL 3 MG MT LOZG
1.0000 | LOZENGE | OROMUCOSAL | Status: DC | PRN
  Administered 2022-02-05 – 2022-02-12 (×2): 3 mg via ORAL
  Filled 2022-02-04 (×3): qty 9

## 2022-02-04 MED ORDER — SODIUM CHLORIDE 0.9 % IV SOLN: INTRAVENOUS | Status: AC

## 2022-02-04 MED ORDER — OXYCODONE HCL 5 MG PO TABS
5.0000 mg | ORAL_TABLET | ORAL | Status: DC | PRN
  Administered 2022-02-04: 5 mg via ORAL
  Administered 2022-02-05 (×2): 10 mg via ORAL
  Filled 2022-02-04: qty 1
  Filled 2022-02-04 (×2): qty 2

## 2022-02-04 NOTE — Consult Note (Signed)
Patient ID: Joseph Hogan MRN: [id number] DOB/AGE: [birth year] 61 y.o.  Admit date: 02/03/2022  Admission Diagnoses:  Principal Problem:   Hip fracture [facility]) Active Problems:   Lumbar radiculopathy   Leukocytosis   HPI: Ortho consult for acute displaced subcapital left femoral neck fracture sustained 02/03/22. Per patient, he had some antecedent groin/thigh pain which he attributed to lumbar radiculopathy. He visits chiropractor regularly for the same.  PMH notable for lumbar radiculopathy, hypertension, prediabetes.  The patient is an independent community ambulator without assistive device.  Past Medical History: Past Medical History:  Diagnosis Date   Arthritis    Hypertension    MRSA (methicillin resistant Staphylococcus aureus)    Pre-diabetes     Surgical History: No past surgical history on file.  Family History: Family History  Problem Relation Age of Onset   Stroke Mother    Hypertension Mother    Heart disease Father    Alzheimer's disease Father    Hypertension Brother    Alcohol abuse Brother    Cancer Maternal Aunt    Heart disease Maternal Aunt    Cancer Maternal Uncle    Heart disease Maternal Uncle    Cancer Paternal Aunt    Heart disease Paternal Aunt    Cancer Paternal Uncle    Heart disease Paternal Uncle    Heart disease Paternal Grandmother    Alzheimer's disease Paternal Grandfather     Social History: Social History   Socioeconomic History   Marital status: Divorced    Spouse name: Not on file   Number of children: Not on file   Years of education: Not on file   Highest education level: Not on file  Occupational History   Not on file  Tobacco Use   Smoking status: Never   Smokeless tobacco: Never  Vaping Use   Vaping Use: Never used  Substance and Sexual Activity   Alcohol use: No   Drug use: No   Sexual activity: Not on file  Other Topics Concern   Not on file  Social History Narrative   Not on file   Social  Determinants of Health   Financial Resource Strain: Not on file  Food Insecurity: No Food Insecurity (02/04/2022)   Hunger Vital Sign    Worried About Running Out of Food in the Last Year: Never true    Ran Out of Food in the Last Year: Never true  Transportation Needs: No Transportation Needs (02/04/2022)   PRAPARE - Hydrologist (Medical): No    Lack of Transportation (Non-Medical): No  Physical Activity: Not on file  Stress: Not on file  Social Connections: Not on file  Intimate Partner Violence: Not At Risk (02/04/2022)   Humiliation, Afraid, Rape, and Kick questionnaire    Fear of Current or Ex-Partner: No    Emotionally Abused: No    Physically Abused: No    Sexually Abused: No    Allergies: Codeine and Lisinopril  Medications: I have reviewed the patient's current medications.  Vital Signs: Patient Vitals for the past 24 hrs:  BP Temp Temp src Pulse Resp SpO2 Height Weight  02/04/22 0810 (!) 161/91 98.7 F (37.1 C) Oral 85 18 97 % -- --  02/04/22 0000 (!) 153/88 98 F (36.7 C) Oral 95 16 -- -- --  02/03/22 2234 -- 98.4 F (36.9 C) Oral -- 19 -- -- --  02/03/22 2230 (!) 177/97 -- -- (!) 101 -- 94 % -- --  02/03/22 2130 (!) 171/101 -- -- 92 19 100 % -- --  02/03/22 2030 (!) 179/88 -- -- 92 16 97 % -- --  02/03/22 1952 -- -- -- 97 16 98 % -- --  02/03/22 1945 (!) 201/108 -- -- (!) 108 (!) 21 98 % -- --  02/03/22 1900 (!) 165/98 -- -- 96 (!) 24 95 % -- --  02/03/22 1810 (!) 158/97 -- -- 97 (!) 26 100 % -- --  02/03/22 1640 (!) 149/84 -- -- 85 18 90 % -- --  02/03/22 1540 (!) 162/89 98.1 F (36.7 C) Oral 89 18 99 % -- --  02/03/22 1531 -- -- -- -- -- -- '5\' 11"'$  (1.803 m) 108.9 kg    Radiology: DG Hip Unilat W or Wo Pelvis 2-3 Views Left  Result Date: 02/03/2022 CLINICAL DATA:  Left hip pain after fall EXAM: DG HIP (WITH OR WITHOUT PELVIS) 2-3V LEFT COMPARISON:  12/22/2021 FINDINGS: Acute displaced subcapital left femoral neck  fracture. The distal femoral shaft is displaced superiorly and laterally. The left femoral head remains seated within the left acetabulum. IMPRESSION: Acute subcapital left femoral neck fracture. Electronically Signed   By: Placido Sou M.D.   On: 02/03/2022 17:31    Labs: Recent Labs    02/03/22 1821 02/04/22 0316  WBC 13.0* 8.3  RBC 4.49 4.21*  HCT 37.7* 35.0*  PLT 418* 360   Recent Labs    02/03/22 1821 02/04/22 0316  NA 138 134*  K 4.0 4.1  CL 103 102  CO2 23 24  BUN 16 17  CREATININE 0.82 0.79  GLUCOSE 178* 145*  CALCIUM 9.0 8.8*   Recent Labs    02/03/22 1821  INR 1.2    Review of Systems: ROS as detailed in HPI  Physical Exam: Body mass index is 33.47 kg/m.  Physical Exam  Gen: AAOx3, NAD Comfortable at rest  Left Lower Extremity: Skin intact Shortened, ER+ TTP over left hip ADF/APF/EHL 5/5 SILT throughout DP, PT 2+ to palp CR < 2s   Assessment and Plan: Ortho consult for acute displaced subcapital left femoral neck fracture sustained 02/03/22  -history, exam and imaging reviewed at length with patient -he will require left hip arthroplasty, will review with joints colleagues to determine OR timing -please page anesthesia for nerve block -preop clearance per primary team -PT/OT postop  Armond Hang, MD Orthopaedic Surgeon [facility] [phone]  The risks and benefits were presented and reviewed. The risks due to hardware failure/irritation, new/persistent infection, stiffness, nerve/vessel/tendon injury, nonunion/malunion, wound healing issues, allograft usage, development of arthritis, failure of this surgery, possibility of delayed definitive surgery, need for further surgery, thromboembolic events, anesthesia/medical complications, amputation, death among others were discussed. The patient acknowledged the explanation, agreed to proceed with the plan.

## 2022-02-04 NOTE — Progress Notes (Signed)
Reviewed CT scan images and results.  Pt has lytic lesions in the area of the fracture and the superior pubic ramus with enlarged lymph nodes.  I informed the patient of the CT results and explained the possibility of malignancy and the need for workup.  Spoke with Dr. Marthenia Rolling who will order appropriate CTs and lab work.  Pt will still need surgical treatment of the fracture.  Timing to be determined in the coming days.  OK for pt to eat today.  Pls keep NPO after midnight and hold blood thinners.

## 2022-02-04 NOTE — Progress Notes (Signed)
PROGRESS NOTE    Joseph Hogan  OJJ:[id number] DOB: 1960-08-21 DOA: 02/03/2022 PCP: Leonie Douglas, MD  Outpatient Specialists:     Brief Narrative:  As per H&P done on admission: "Joseph Hogan is a 61 y.o. male with medical history significant of arthritis, lumbar radiculopathy, hypertension, prediabetes, not taking any medications, but instead Taking natural remedies, presents to ED with a chief complaint of hip pain.  Patient reports that he has been ambulating with a walker secondary to lumbar radicular pain.  He was trying to go up into his home with a walker when he had a mechanical slip and fall.  Patient denies any chest pain, shortness of breath, dizziness prior to his fall.  He denies hitting his head.  He denies being on any blood thinners.  He did not lose consciousness.  It was a straightforward simple fall.  He had severe hip pain afterwards.  Pain goes from his groin to his knee.  This is the same distribution as his lumbar radicular pain, so he reports it is a double pain.  His pain has been very hard to control the ED.  He has had multiple doses of Dilaudid, he had a dose of fentanyl, dose of ketamine, dose of Toradol.  He reports nothing is touching his pain, and he is hypertensive, but he appears as though his pain is managed.  Patient has no other complaints at this time.   Patient does not smoke, does not drink, does not use illicit drugs.  He is not vaccinated for COVID.  Patient is full code".  CT scan of the left hip revealed: Acute pathologic fracture of the left femoral neck with displacement and angulation, with underlying lytic lesions in the femoral head and neck.   Additional lytic destructive lesion of the left parasymphyseal pubic bone extending throughout the superior pubic ramus to the puboacetabular junction, with pathologic fracture of the superior pubic ramus. Adjacent hypoattenuation in the pelvic component of the obturator internus muscle,  suspicious for tumor involvement. Probable small lytic lesion in the inferior pubic ramus.   Prominent left external iliac lymph nodes measuring up to 1.2 cm.   Recommend malignancy workup including SPEP/UPEP and CT of the chest, abdomen, and pelvis.  02/04/2022: Patient seen.  No new complaints.  Available records reviewed.  I also discussed with the orthopedic surgery team.  Orthopedic surgery plans to proceed with surgery.  Patient will be worked up for possible metastatic disease.  Will have low threshold to consult the oncology team.   Assessment & Plan:   Principal Problem:   Hip fracture [facility]) Active Problems:   Lumbar radiculopathy   Leukocytosis   Hip fracture (HCC) - Mechanical slip and - CT left hip shows acute subcapital left femoral neck fracture - Ortho consulted and recommends admission to [facility] or [facility] - N.p.o. after midnight - Patient typed and screened - Pain control is been difficult for this patient with Dilaudid, fentanyl, Toradol, ketamine not touching his pain in the ER - Will continue with as needed fentanyl and add gabapentin given the radicular pain as well as Flexeril 02/04/2022: Orthopedic team plans to proceed with surgery.  Further management as per orthopedic team.     Leukocytosis - 13.0 - Likely secondary to hip fracture - No signs of infection - No urinary symptoms - No respiratory symptoms - Will continue to monitor 02/04/2022: Leukocytosis has resolved.  WBCs 8.3 today.   Lumbar radiculopathy - Patient reports that he is following with  a chiropractor for lumbar radiculopathy and supposed to be getting in with pain management/Ortho soon regarding this problem - Start gabapentin - Continue with above-noted pain control   Anemia: -Normocytic. -Hemoglobin today is 11.9 g/dL.   Lytic bone lesions: -Check PSA, SPEP, UPEP, serum free light chains. -CT chest, abdomen and pelvis with contrast. -Rule out metastatic disease to the  bone.   DVT prophylaxis: Subcutaneous heparin Code Status: Full code Family Communication:  Disposition Plan: This will depend on hospital course   Consultants:  Orthopedic consult. Low threshold to consult the oncology team.    Procedures:  None follow-up.  Antimicrobials:  None   Subjective: Patient continues to report pain, though, improved.  Objective: Vitals:   02/03/22 2130 02/03/22 2230 02/03/22 2234 02/04/22 0000  BP: (!) 171/101 (!) 177/97  (!) 153/88  Pulse: 92 (!) 101  95  Resp: '19  19 16  '$ Temp:   98.4 F (36.9 C) 98 F (36.7 C)  TempSrc:   Oral Oral  SpO2: 100% 94%    Weight:      Height:       No intake or output data in the 24 hours ending 02/04/22 0750 Filed Weights   02/03/22 1531  Weight: 108.9 kg    Examination:  General exam: Appears calm and comfortable.  Patient is obese.  Patient is pale. Respiratory system: Clear to auscultation.  Cardiovascular system: S1 & S2 heard, systolic murmur with increased intensity of S2 component of the heart sound. Gastrointestinal system: Abdomen is obese, soft and nontender.   Central nervous system: Alert and oriented. No focal neurological deficits. Extremities: Fullness of the ankle.  Data Reviewed: I have personally reviewed following labs and imaging studies  CBC: Recent Labs  Lab 02/03/22 1821 02/04/22 0316  WBC 13.0* 8.3  NEUTROABS 11.6* 6.7  HGB 12.8* 11.9*  HCT 37.7* 35.0*  MCV 84.0 83.1  PLT 418* 989   Basic Metabolic Panel: Recent Labs  Lab 02/03/22 1821 02/04/22 0316  NA 138 134*  K 4.0 4.1  CL 103 102  CO2 23 24  GLUCOSE 178* 145*  BUN 16 17  CREATININE 0.82 0.79  CALCIUM 9.0 8.8*  MG  --  2.0   GFR: Estimated Creatinine Clearance: 118.6 mL/min (by C-G formula based on SCr of 0.79 mg/dL). Liver Function Tests: Recent Labs  Lab 02/04/22 0316  AST 24  ALT 18  ALKPHOS 100  BILITOT 0.3  PROT 6.8  ALBUMIN 3.2*   No results for input(s): "LIPASE", "AMYLASE" in the  last 168 hours. No results for input(s): "AMMONIA" in the last 168 hours. Coagulation Profile: Recent Labs  Lab 02/03/22 1821  INR 1.2   Cardiac Enzymes: No results for input(s): "CKTOTAL", "CKMB", "CKMBINDEX", "TROPONINI" in the last 168 hours. BNP (last 3 results) No results for input(s): "PROBNP" in the last 8760 hours. HbA1C: No results for input(s): "HGBA1C" in the last 72 hours. CBG: No results for input(s): "GLUCAP" in the last 168 hours. Lipid Profile: No results for input(s): "CHOL", "HDL", "LDLCALC", "TRIG", "CHOLHDL", "LDLDIRECT" in the last 72 hours. Thyroid Function Tests: Recent Labs    02/04/22 0316  TSH 2.026   Anemia Panel: No results for input(s): "VITAMINB12", "FOLATE", "FERRITIN", "TIBC", "IRON", "RETICCTPCT" in the last 72 hours. Urine analysis:    Component Value Date/Time   BILIRUBINUR SMALL 02/13/2016 1437   PROTEINUR 30 02/13/2016 1437   UROBILINOGEN 0.2 02/13/2016 1437   NITRITE NEG 02/13/2016 1437   LEUKOCYTESUR Negative 02/13/2016 1437  Sepsis Labs: '@LABRCNTIP'$ (procalcitonin:4,lacticidven:4)  )No results found for this or any previous visit (from the past 240 hour(s)).       Radiology Studies: DG Hip Unilat W or Wo Pelvis 2-3 Views Left  Result Date: 02/03/2022 CLINICAL DATA:  Left hip pain after fall EXAM: DG HIP (WITH OR WITHOUT PELVIS) 2-3V LEFT COMPARISON:  12/22/2021 FINDINGS: Acute displaced subcapital left femoral neck fracture. The distal femoral shaft is displaced superiorly and laterally. The left femoral head remains seated within the left acetabulum. IMPRESSION: Acute subcapital left femoral neck fracture. Electronically Signed   By: Placido Sou M.D.   On: 02/03/2022 17:31        Scheduled Meds:  ascorbic acid  250 mg Oral Daily   B-complex with vitamin C  1 tablet Oral Daily   cholecalciferol  1,000 Units Oral Daily   cyclobenzaprine  10 mg Oral QHS   gabapentin  300 mg Oral TID   heparin  5,000 Units  Subcutaneous Q8H   multivitamin with minerals  1 tablet Oral Daily   Continuous Infusions:   LOS: 1 day    Time spent: 55 minutes.    Dana Allan, MD  [facility] Pager #: [phone] 7PM-7AM contact night coverage as above

## 2022-02-04 NOTE — Progress Notes (Signed)
Spoke to MD about pts pain Pt states fentanyl does not work for him. MD gave one time order for '10mg'$  oxy in addition to his current PRNs. No other changes made. Will continue to monitor

## 2022-02-05 ENCOUNTER — Inpatient Hospital Stay (HOSPITAL_COMMUNITY): Payer: Medicare HMO | Admitting: Anesthesiology

## 2022-02-05 ENCOUNTER — Inpatient Hospital Stay (HOSPITAL_COMMUNITY): Payer: Medicare HMO

## 2022-02-05 DIAGNOSIS — S72002A Fracture of unspecified part of neck of left femur, initial encounter for closed fracture: Secondary | ICD-10-CM | POA: Diagnosis not present

## 2022-02-05 MED ORDER — TRANEXAMIC ACID-NACL 1000-0.7 MG/100ML-% IV SOLN
1000.0000 mg | INTRAVENOUS | Status: DC
  Filled 2022-02-05: qty 100

## 2022-02-05 MED ORDER — CYCLOBENZAPRINE HCL 10 MG PO TABS
10.0000 mg | ORAL_TABLET | Freq: Three times a day (TID) | ORAL | Status: DC | PRN
  Administered 2022-02-05 – 2022-02-06 (×3): 10 mg via ORAL
  Filled 2022-02-05 (×3): qty 1

## 2022-02-05 MED ORDER — IOHEXOL 9 MG/ML PO SOLN
500.0000 mL | ORAL | Status: AC
  Administered 2022-02-05 (×2): 500 mL via ORAL

## 2022-02-05 MED ORDER — ROPIVACAINE HCL 5 MG/ML IJ SOLN
INTRAMUSCULAR | Status: DC | PRN
  Administered 2022-02-05: 30 mL via PERINEURAL

## 2022-02-05 MED ORDER — OXYCODONE HCL 5 MG PO TABS
10.0000 mg | ORAL_TABLET | ORAL | Status: DC | PRN
  Administered 2022-02-05 – 2022-02-06 (×4): 20 mg via ORAL
  Administered 2022-02-06: 15 mg via ORAL
  Administered 2022-02-06: 20 mg via ORAL
  Administered 2022-02-07: 15 mg via ORAL
  Filled 2022-02-05: qty 3
  Filled 2022-02-05: qty 4
  Filled 2022-02-05: qty 3
  Filled 2022-02-05 (×4): qty 4

## 2022-02-05 MED ORDER — FENTANYL CITRATE (PF) 100 MCG/2ML IJ SOLN
INTRAMUSCULAR | Status: AC
  Administered 2022-02-05: 50 ug
  Filled 2022-02-05: qty 2

## 2022-02-05 MED ORDER — IOHEXOL 350 MG/ML SOLN
75.0000 mL | Freq: Once | INTRAVENOUS | Status: AC | PRN
  Administered 2022-02-05: 75 mL via INTRAVENOUS

## 2022-02-05 MED ORDER — CEFAZOLIN SODIUM-DEXTROSE 2-4 GM/100ML-% IV SOLN
2.0000 g | INTRAVENOUS | Status: AC
  Administered 2022-02-06: 2 g via INTRAVENOUS
  Filled 2022-02-05: qty 100

## 2022-02-05 MED ORDER — POVIDONE-IODINE 10 % EX SWAB
2.0000 | Freq: Once | CUTANEOUS | Status: AC
  Administered 2022-02-06: 2 via TOPICAL

## 2022-02-05 MED ORDER — MIDAZOLAM HCL 2 MG/2ML IJ SOLN
INTRAMUSCULAR | Status: AC
  Administered 2022-02-05: 1 mg
  Filled 2022-02-05: qty 2

## 2022-02-05 MED ORDER — CLONIDINE HCL (ANALGESIA) 100 MCG/ML EP SOLN
EPIDURAL | Status: DC | PRN
  Administered 2022-02-05: 50 ug

## 2022-02-05 MED ORDER — CHLORHEXIDINE GLUCONATE 4 % EX LIQD: 60.0000 mL | Freq: Once | CUTANEOUS | Status: DC

## 2022-02-05 NOTE — Progress Notes (Signed)
Spoke with Maudie Mercury to Set up transport for patient to come to [facility] for surgery tomorrow via [facility]. Pt to arrive to short stay by 1345 for a  scheduled start time of  1615.

## 2022-02-05 NOTE — Progress Notes (Signed)
Gave report to [place] from [place] about pt for [employer]. [facility] should be here between 1230-1pm to pick him up.

## 2022-02-05 NOTE — H&P (View-Only) (Signed)
ORTHOPAEDIC CONSULTATION  REQUESTING PHYSICIAN: Bonnell Public, MD  Chief Complaint: Left hip fracture  HPI: Joseph Hogan is a 61 y.o. male who has been having progressively worsening pain in the left groin and knee for the last 5 to 6 weeks.  He was being treated by his chiropractor.  2 days ago sustained a fall while walking in his home.  Unable to ambulate.  Was brought to the emergency room where imaging demonstrated a displaced femoral neck fracture.  He reports that he has been using a walker for the past 3 weeks.  He reports that greater than 2 months ago did not have any pain or problems with the hip.  He does report a history of chronic back pain however.  Denies distal numbness and tingling.  Denies pain in other joints or extremities at this time. Past Medical History:  Diagnosis Date   Arthritis    Hypertension    MRSA (methicillin resistant Staphylococcus aureus)    Pre-diabetes    No past surgical history on file. Social History   Socioeconomic History   Marital status: Divorced    Spouse name: Not on file   Number of children: Not on file   Years of education: Not on file   Highest education level: Not on file  Occupational History   Not on file  Tobacco Use   Smoking status: Never   Smokeless tobacco: Never  Vaping Use   Vaping Use: Never used  Substance and Sexual Activity   Alcohol use: No   Drug use: No   Sexual activity: Not on file  Other Topics Concern   Not on file  Social History Narrative   Not on file   Social Determinants of Health   Financial Resource Strain: Not on file  Food Insecurity: No Food Insecurity (02/04/2022)   Hunger Vital Sign    Worried About Running Out of Food in the Last Year: Never true    Ran Out of Food in the Last Year: Never true  Transportation Needs: No Transportation Needs (02/04/2022)   PRAPARE - Hydrologist (Medical): No    Lack of Transportation (Non-Medical): No  Physical  Activity: Not on file  Stress: Not on file  Social Connections: Not on file   Family History  Problem Relation Age of Onset   Stroke Mother    Hypertension Mother    Heart disease Father    Alzheimer's disease Father    Hypertension Brother    Alcohol abuse Brother    Cancer Maternal Aunt    Heart disease Maternal Aunt    Cancer Maternal Uncle    Heart disease Maternal Uncle    Cancer Paternal Aunt    Heart disease Paternal Aunt    Cancer Paternal Uncle    Heart disease Paternal Uncle    Heart disease Paternal Grandmother    Alzheimer's disease Paternal Grandfather    Allergies  Allergen Reactions   Codeine Nausea And Vomiting   Lisinopril Cough     Positive ROS: All other systems have been reviewed and were otherwise negative with the exception of those mentioned in the HPI and as above.  Physical Exam: General: Alert, no acute distress Cardiovascular: No pedal edema Respiratory: No cyanosis, no use of accessory musculature Skin: No lesions in the area of chief complaint Neurologic: Sensation intact distally Psychiatric: Patient is competent for consent with normal mood and affect  MUSCULOSKELETAL:  LLE No traumatic wounds, ecchymosis, or rash  Nontender  Hip ROM deferred given known fx  No knee or ankle effusion  Sens DPN, SPN, TN intact  Motor EHL, ext, flex 5/5  DP 2+, PT 2+, No significant edema  RLE No traumatic wounds, ecchymosis, or rash  Nontender  No groin pain with log roll  No knee or ankle effusion  Knee stable to varus/ valgus stress  Sens DPN, SPN, TN intact  Motor EHL, ext, flex 5/5  DP 2+, PT 2+, No significant edema    IMAGING: X-rays and CT scan were reviewed demonstrate displaced left femoral neck fracture.  Small lytic lesions in the femoral neck.  Assessment: Principal Problem:   Hip fracture (HCC) Active Problems:   Lumbar radiculopathy   Leukocytosis  Left hip pathologic femoral neck fracture  Plan: Discussed with the  patient that his symptoms and findings are concerning for pathologic fracture.  Awaiting input from oncology.  Given the fracture ultimately will need a total hip arthroplasty.  Risk benefits of follow-up arthroplasty surgery were discussed with the patient at bedside and he is agreeable to proceeding with surgery.    [name], MD  Contact information:   [id number] 7am-5pm epic message Dr. Zachery Dakins, or call office for patient follow up: [phone] After hours and holidays please check [email or web address] for group call information for [facility]

## 2022-02-05 NOTE — Consult Note (Signed)
ORTHOPAEDIC CONSULTATION  REQUESTING PHYSICIAN: Bonnell Public, MD  Chief Complaint: Left hip fracture  HPI: Joseph Hogan is a 61 y.o. male who has been having progressively worsening pain in the left groin and knee for the last 5 to 6 weeks.  He was being treated by his chiropractor.  2 days ago sustained a fall while walking in his home.  Unable to ambulate.  Was brought to the emergency room where imaging demonstrated a displaced femoral neck fracture.  He reports that he has been using a walker for the past 3 weeks.  He reports that greater than 2 months ago did not have any pain or problems with the hip.  He does report a history of chronic back pain however.  Denies distal numbness and tingling.  Denies pain in other joints or extremities at this time. Past Medical History:  Diagnosis Date   Arthritis    Hypertension    MRSA (methicillin resistant Staphylococcus aureus)    Pre-diabetes    No past surgical history on file. Social History   Socioeconomic History   Marital status: Divorced    Spouse name: Not on file   Number of children: Not on file   Years of education: Not on file   Highest education level: Not on file  Occupational History   Not on file  Tobacco Use   Smoking status: Never   Smokeless tobacco: Never  Vaping Use   Vaping Use: Never used  Substance and Sexual Activity   Alcohol use: No   Drug use: No   Sexual activity: Not on file  Other Topics Concern   Not on file  Social History Narrative   Not on file   Social Determinants of Health   Financial Resource Strain: Not on file  Food Insecurity: No Food Insecurity (02/04/2022)   Hunger Vital Sign    Worried About Running Out of Food in the Last Year: Never true    Ran Out of Food in the Last Year: Never true  Transportation Needs: No Transportation Needs (02/04/2022)   PRAPARE - Hydrologist (Medical): No    Lack of Transportation (Non-Medical): No  Physical  Activity: Not on file  Stress: Not on file  Social Connections: Not on file   Family History  Problem Relation Age of Onset   Stroke Mother    Hypertension Mother    Heart disease Father    Alzheimer's disease Father    Hypertension Brother    Alcohol abuse Brother    Cancer Maternal Aunt    Heart disease Maternal Aunt    Cancer Maternal Uncle    Heart disease Maternal Uncle    Cancer Paternal Aunt    Heart disease Paternal Aunt    Cancer Paternal Uncle    Heart disease Paternal Uncle    Heart disease Paternal Grandmother    Alzheimer's disease Paternal Grandfather    Allergies  Allergen Reactions   Codeine Nausea And Vomiting   Lisinopril Cough     Positive ROS: All other systems have been reviewed and were otherwise negative with the exception of those mentioned in the HPI and as above.  Physical Exam: General: Alert, no acute distress Cardiovascular: No pedal edema Respiratory: No cyanosis, no use of accessory musculature Skin: No lesions in the area of chief complaint Neurologic: Sensation intact distally Psychiatric: Patient is competent for consent with normal mood and affect  MUSCULOSKELETAL:  LLE No traumatic wounds, ecchymosis, or rash  Nontender  Hip ROM deferred given known fx  No knee or ankle effusion  Sens DPN, SPN, TN intact  Motor EHL, ext, flex 5/5  DP 2+, PT 2+, No significant edema  RLE No traumatic wounds, ecchymosis, or rash  Nontender  No groin pain with log roll  No knee or ankle effusion  Knee stable to varus/ valgus stress  Sens DPN, SPN, TN intact  Motor EHL, ext, flex 5/5  DP 2+, PT 2+, No significant edema    IMAGING: X-rays and CT scan were reviewed demonstrate displaced left femoral neck fracture.  Small lytic lesions in the femoral neck.  Assessment: Principal Problem:   Hip fracture (HCC) Active Problems:   Lumbar radiculopathy   Leukocytosis  Left hip pathologic femoral neck fracture  Plan: Discussed with the  patient that his symptoms and findings are concerning for pathologic fracture.  Awaiting input from oncology.  Given the fracture ultimately will need a total hip arthroplasty.  Risk benefits of follow-up arthroplasty surgery were discussed with the patient at bedside and he is agreeable to proceeding with surgery.    [name], MD  Contact information:   [id number] 7am-5pm epic message Dr. Zachery Dakins, or call office for patient follow up: [phone] After hours and holidays please check [email or web address] for group call information for [facility]

## 2022-02-05 NOTE — Progress Notes (Signed)
PACU reported pt having a fever of 101.1 Gave tylenol on his return to unit from the nerve block . MD informed

## 2022-02-05 NOTE — Anesthesia Procedure Notes (Signed)
Anesthesia Regional Block: Peng block   Pre-Anesthetic Checklist: , timeout performed,  Correct Patient, Correct Site, Correct Laterality,  Correct Procedure, Correct Position, site marked,  Risks and benefits discussed,  Surgical consent,  Pre-op evaluation,  At surgeon's request and post-op pain management  Laterality: Left  Prep: chloraprep       Needles:  Injection technique: Single-shot  Needle Type: Echogenic Needle     Needle Length: 9cm  Needle Gauge: 21     Additional Needles:   Procedures:,,,, ultrasound used (permanent image in chart),,    Narrative:  Start time: 02/05/2022 6:00 PM End time: 02/05/2022 6:06 PM Injection made incrementally with aspirations every 5 mL.  Performed by: Personally  Anesthesiologist: Suzette Battiest, MD

## 2022-02-05 NOTE — Anesthesia Pain Management Evaluation Note (Signed)
  Anesthesia Pain Consult Note  Patient: Joseph Hogan, 61 y.o., male  Consult Requested by: Bonnell Public, MD  Reason for Consult: hip fracture  Level of Consciousness: alert  Pain: moderate   Last Vitals:  Vitals:   02/05/22 0842 02/05/22 1318  BP: (!) 164/82 (!) 156/80  Pulse: 91 98  Resp:  18  Temp: 37.7 C 37.2 C  SpO2: 91% 94%    Plan: Peripheral nerve block for pain control  Consent:Risks of procedure as well as the alternatives and risks of each were explained to the (patient/caregiver).  Consent for procedure obtained.  Allergies  Allergen Reactions   Codeine Nausea And Vomiting   Lisinopril Cough    Physical exam: PULM normal and clear to auscultation  CARDIO Heart sounds are normal.  Regular rate and rhythm without murmur, gallop or rub.  OTHER    I have reviewed the patient's medications listed below.  ascorbic acid  250 mg Oral Daily   B-complex with vitamin C  1 tablet Oral Daily   chlorhexidine  60 mL Topical Once   cholecalciferol  1,000 Units Oral Daily   fentaNYL       gabapentin  300 mg Oral TID   heparin  5,000 Units Subcutaneous Q8H   midazolam       multivitamin with minerals  1 tablet Oral Daily   povidone-iodine  2 Application Topical Once    [START ON 02/06/2022]  ceFAZolin (ANCEF) IV     tranexamic acid     acetaminophen **OR** acetaminophen, cyclobenzaprine, fentaNYL, fentaNYL (SUBLIMAZE) injection, menthol-cetylpyridinium, midazolam, ondansetron **OR** ondansetron (ZOFRAN) IV, oxyCODONE, senna-docusate  Past Medical History:  Diagnosis Date   Arthritis    Hypertension    MRSA (methicillin resistant Staphylococcus aureus)    Pre-diabetes    No past surgical history on file.  reports that he has never smoked. He has never used smokeless tobacco. He reports that he does not drink alcohol and does not use drugs.    Tiajuana Amass 02/05/2022

## 2022-02-05 NOTE — Consult Note (Signed)
Hematology/Oncology Consult Note  Clinical Summary: [name] is a 61 year old male who presented on 02/03/2022 with left hip pain and was found to have lytic lesions and concern for a pathological fracture.   Reason for Consult: Pathological fracture/lytic bone lesions.   HPI: [name] is a 61 year old male with medical history significant for hypertension, type 2 diabetes, morbid obesity, and chronic back pain who presents for evaluation of left hip pain following a fall.  The patient presented to the emergency department on 02/03/2022 after he tripped on his walker and developed left hip pain.  He was unable to walk due to the severity of the pain.  In the emergency department he underwent an x-ray which showed an acute subcapital left femoral neck fracture.  CT scan of the left hip was performed on 02/04/2022 which showed acute pathological fracture of the left femoral neck with displacement and angulation with underlying lytic lesions in the femoral neck and head.  There is also a lytic obstructive lesion of the left pubic bone extending to the superior pubic ramus.  There are also prominent iliac lymph nodes measuring up to 1.2 cm.  Due to concern for these findings oncology was consulted for further evaluation and management.  On exam today [name] reports that he tripped over his rug on 02/03/2022 which caused his fall.  He reports he fell straight on his left hip and developed immediate pain.  He was not able to ambulate and had to be taken to the hospital via ambulance.  He reports his pain is currently under good control.  He notes he had been otherwise at his baseline level of health until he had this fall.  He had no recent weight loss, fevers, chills, sweats, nausea, vomiting or diarrhea.  He notes that he is a never smoker and has no prior history of malignancy.  A full 10 point ROS is otherwise negative.  O:  Vitals:   02/04/22 2000 02/05/22 0842  BP: (!) 159/82  (!) 164/82  Pulse: 93 91  Resp:    Temp: (!) 100.7 F (38.2 C) 99.9 F (37.7 C)  SpO2: 92% 91%      Latest Ref Rng & Units 02/04/2022    3:16 AM 02/03/2022    6:21 PM 11/27/2020    3:50 AM  CMP  Glucose 70 - 99 mg/dL 145  178  163   BUN 8 - 23 mg/dL _0 Creatinine 0.61 - 1.24 mg/dL 0.79  0.82  0.64   Sodium 135 - 145 mmol/L 134  138  137   Potassium 3.5 - 5.1 mmol/L 4.1  4.0  3.6   Chloride 98 - 111 mmol/L 102  103  106   CO2 22 - 32 mmol/L _1 Calcium 8.9 - 10.3 mg/dL 8.8  9.0  9.3   Total Protein 6.5 - 8.1 g/dL 6.8   7.8   Total Bilirubin 0.3 - 1.2 mg/dL 0.3   0.7   Alkaline Phos 38 - 126 U/L 100   54   AST 15 - 41 U/L 24   23   ALT 0 - 44 U/L 18   29       Latest Ref Rng & Units 02/04/2022    3:16 AM 02/03/2022    6:21 PM 11/27/2020    3:50 AM  CBC  WBC 4.0 - 10.5 K/uL 8.3  13.0  9.2   Hemoglobin 13.0 - 17.0 g/dL 11.9  12.8  14.9   Hematocrit 39.0 - 52.0 % 35.0  37.7  42.8   Platelets 150 - 400 K/uL 360  418  239       GENERAL: well appearing middle aged Caucasian male in NAD  SKIN: skin color, texture, turgor are normal, no rashes or significant lesions EYES: conjunctiva are pink and non-injected, sclera clear LUNGS: clear to auscultation and percussion with normal breathing effort HEART: regular rate & rhythm and no murmurs and no lower extremity edema Musculoskeletal: no cyanosis of digits and no clubbing  PSYCH: alert & oriented x 3, fluent speech NEURO: no focal motor/sensory deficits  Assessment/Plan:  [name] is a 61 year old male who presented on 02/03/2022 with left hip pain and was found to have lytic lesions and concern for a pathological fracture.   At this time findings are concerning for malignancy, either multiple myeloma with lytic lesions or metastatic spread of a solid tumor.  CT chest abdomen pelvis did not reveal a clear primary.  Most likely etiologies include multiple myeloma or prostate cancer at this time.   We have ordered initial serological studies and tissue for pathological review will be obtained during his hip surgery.  The patient voiced understanding of our findings so far and the plan moving forward.  #Pathological Left Femoral Fracture # Lytic Lesions of the Bone # Lymphadenopathy --will order PSA and SPEP/SFLC to evaluate for possible cause of lytic lesions --if feasible during hip surgery please obtain tissue from pathological fracture. Management of fracture per primary team.  --agree with CT C/A/P W contrast to assess for a primary --Oncology will continue to follow. Workup can continue in outpatient setting, our workup is not a barrier to discharge.    Ledell Peoples, MD Department of Hematology/Oncology [place] [facility] Phone: [phone] Pager: [phone] Email: [email or web address]

## 2022-02-05 NOTE — Progress Notes (Signed)
PROGRESS NOTE    Joseph Hogan  ZOX:[id number] DOB: 10-22-1960 DOA: 02/03/2022 PCP: Leonie Douglas, MD  Outpatient Specialists:     Brief Narrative:  Patient is a 61 year old male male with medical history significant of arthritis, lumbar radiculopathy, hypertension, prediabetes, not taking any medications, but instead Taking natural remedies.  Patient was admitted with left hip pain following a mechanical fall..  Apparently, patient was ambulating with a walker secondary to lumbar radicular pain.  Patient was trying to go up into his home with a walker when he had a mechanical slip and fall.  Patient's pain was very difficult to control the ED. patient required multiple doses of Dilaudid, a dose of fentanyl, dose of ketamine, dose of Toradol.  Blood pressure was also significantly elevated.  CT scan of the left hip done revealed acute pathological fracture of the left femoral neck with displacement and angulation, with underlying lytic lesions in the femoral head and neck.  Patient is also being worked up for possible metastasis to the bones.  CT scan of the chest, abdomen and pelvis is pending.  I suspect this CT scan was not done due to significant pain.  Myeloma panel is pending (currently follow-up ordered workup).  Please have low threshold to consult oncology team.  Orthopedic team is directing patient's pain management.  At some point earlier today, orthopedic team suggested possible nerve block by anesthesia.  Patient was ambulatory prior to the fall.  No reported chest pain or dyspnea on exertion prior to the fall.  Orthopedic team has advised the patient to be transferred to [facility] for possible surgery tomorrow, Wednesday, February 06, 2022.  CT scan of the left hip revealed: Acute pathologic fracture of the left femoral neck with displacement and angulation, with underlying lytic lesions in the femoral head and neck.   Additional lytic destructive lesion of the left  parasymphyseal pubic bone extending throughout the superior pubic ramus to the puboacetabular junction, with pathologic fracture of the superior pubic ramus. Adjacent hypoattenuation in the pelvic component of the obturator internus muscle, suspicious for tumor involvement. Probable small lytic lesion in the inferior pubic ramus.   Prominent left external iliac lymph nodes measuring up to 1.2 cm.   Recommend malignancy workup including SPEP/UPEP and CT of the chest, abdomen, and pelvis.  02/05/2022: Patient seen.  Patient continues to report pain.   Assessment & Plan:   Principal Problem:   Hip fracture ([place]) Active Problems:   Lumbar radiculopathy   Leukocytosis   Hip fracture ([facility]) - Mechanical slip and - CT left hip shows acute subcapital left femoral neck fracture, as well as, lytic bone lesions in the pelvic area. - Ortho consulted. - Patient be transferred to [facility] for possible surgery tomorrow, 02/06/2022.   -Continue to optimize pain control.  Orthopedic team is managing.   -Orthopedic team has recommended possible nerve block.   -Workup for likely metastatic bone disease is also in progress.   Leukocytosis - Initially 13. -Leukocytosis has resolved.  Repeat WBC was 8.   -No constitutional symptoms.   Lumbar radiculopathy - Apparently, chiropractor has been managing patient's lumbar radiculopathy.  A -Continue gabapentin. -Continue to optimize pain control.   Anemia: -Normocytic. -Last hemoglobin was 11.9 g/dL.     Lytic bone lesions: -Worrisome for metastatic bone disease. -Follow myeloma panel and PSA.   -Follow CT chest, abdomen and pelvis with contrast.   DVT prophylaxis: Subcutaneous heparin Code Status: Full code Family Communication:  Disposition Plan:  This will depend on hospital course   Consultants:  Orthopedic consult. Low threshold to consult the oncology team.    Procedures:  None follow-up.  Antimicrobials:   None   Subjective: Patient continues to report pain (left hip region).    Objective: Vitals:   02/04/22 0810 02/04/22 2000 02/05/22 0842 02/05/22 1318  BP: (!) 161/91 (!) 159/82 (!) 164/82 (!) 156/80  Pulse: 85 93 91 98  Resp: 18   18  Temp: 98.7 F (37.1 C) (!) 100.7 F (38.2 C) 99.9 F (37.7 C) 99 F (37.2 C)  TempSrc: Oral Oral Oral Oral  SpO2: 97% 92% 91% 94%  Weight:      Height:       No intake or output data in the 24 hours ending 02/05/22 1700 Filed Weights   02/03/22 1531  Weight: 108.9 kg    Examination:  General exam: Appears calm and comfortable.  Patient is obese.  Patient is pale. Respiratory system: Clear to auscultation.  Cardiovascular system: S1 & S2 heard, systolic murmur with increased intensity of S2 component of the heart sound. Gastrointestinal system: Abdomen is obese, soft and nontender.   Central nervous system: Alert and oriented. No focal neurological deficits. Extremities: Fullness of the ankle.  Data Reviewed: I have personally reviewed following labs and imaging studies  CBC: Recent Labs  Lab 02/03/22 1821 02/04/22 0316  WBC 13.0* 8.3  NEUTROABS 11.6* 6.7  HGB 12.8* 11.9*  HCT 37.7* 35.0*  MCV 84.0 83.1  PLT 418* 161    Basic Metabolic Panel: Recent Labs  Lab 02/03/22 1821 02/04/22 0316  NA 138 134*  K 4.0 4.1  CL 103 102  CO2 23 24  GLUCOSE 178* 145*  BUN 16 17  CREATININE 0.82 0.79  CALCIUM 9.0 8.8*  MG  --  2.0    GFR: Estimated Creatinine Clearance: 118.6 mL/min (by C-G formula based on SCr of 0.79 mg/dL). Liver Function Tests: Recent Labs  Lab 02/04/22 0316  AST 24  ALT 18  ALKPHOS 100  BILITOT 0.3  PROT 6.8  ALBUMIN 3.2*    No results for input(s): "LIPASE", "AMYLASE" in the last 168 hours. No results for input(s): "AMMONIA" in the last 168 hours. Coagulation Profile: Recent Labs  Lab 02/03/22 1821  INR 1.2    Cardiac Enzymes: No results for input(s): "CKTOTAL", "CKMB", "CKMBINDEX",  "TROPONINI" in the last 168 hours. BNP (last 3 results) No results for input(s): "PROBNP" in the last 8760 hours. HbA1C: No results for input(s): "HGBA1C" in the last 72 hours. CBG: No results for input(s): "GLUCAP" in the last 168 hours. Lipid Profile: No results for input(s): "CHOL", "HDL", "LDLCALC", "TRIG", "CHOLHDL", "LDLDIRECT" in the last 72 hours. Thyroid Function Tests: Recent Labs    02/04/22 0316  TSH 2.026    Anemia Panel: No results for input(s): "VITAMINB12", "FOLATE", "FERRITIN", "TIBC", "IRON", "RETICCTPCT" in the last 72 hours. Urine analysis:    Component Value Date/Time   BILIRUBINUR SMALL 02/13/2016 1437   PROTEINUR 30 02/13/2016 1437   UROBILINOGEN 0.2 02/13/2016 1437   NITRITE NEG 02/13/2016 1437   LEUKOCYTESUR Negative 02/13/2016 1437   Sepsis Labs: '@LABRCNTIP'$ (procalcitonin:4,lacticidven:4)  )No results found for this or any previous visit (from the past 240 hour(s)).       Radiology Studies: CT HIP LEFT WO CONTRAST  Result Date: 02/04/2022 CLINICAL DATA:  Hip trauma, fracture suspected, xray done EXAM: CT OF THE LEFT HIP WITHOUT CONTRAST TECHNIQUE: Multidetector CT imaging of the left hip was performed according  to the standard protocol. Multiplanar CT image reconstructions were also generated. RADIATION DOSE REDUCTION: This exam was performed according to the departmental dose-optimization program which includes automated exposure control, adjustment of the mA and/or kV according to patient size and/or use of iterative reconstruction technique. COMPARISON:  Hip radiograph 02/03/2022 FINDINGS: Bones/Joint/Cartilage There is an acute displaced subcapital left femoral neck fracture with 9 mm displacement and angulation. There are underlying lucencies at the fracture site and cortical thinning of the anterior posterior aspect of the femoral head. Additionally, there is a lytic destructive lesion of the left parasymphyseal pubic bone with pathologic  fracture of the superior pubic ramus. Lytic lesion appears to extend throughout the superior pubic ramus to the puboacetabular junction (series 5, image 39). Possible small lesion in the left inferior pubic ramus (series 5, image 64) and in the posterior acetabulum (series 5, image 28). Ligaments Suboptimally assessed by CT. Muscles and Tendons There is hypoattenuation in the obturator internus muscle, pelvic component, the more anterior of which may be reflective of tumor involvement or posttraumatic sequelae. Soft tissues Left hip joint effusion. Soft tissue swelling along the hip. No focal fluid collection. Sigmoid diverticulosis noted. Prominent left external iliac lymph node measuring 1.2 cm (series 5, image 12). Other external iliac lymph node measures 1.2 cm (series 5, image 15). IMPRESSION: Acute pathologic fracture of the left femoral neck with displacement and angulation, with underlying lytic lesions in the femoral head and neck. Additional lytic destructive lesion of the left parasymphyseal pubic bone extending throughout the superior pubic ramus to the puboacetabular junction, with pathologic fracture of the superior pubic ramus. Adjacent hypoattenuation in the pelvic component of the obturator internus muscle, suspicious for tumor involvement. Probable small lytic lesion in the inferior pubic ramus. Prominent left external iliac lymph nodes measuring up to 1.2 cm. Recommend malignancy workup including SPEP/UPEP and CT of the chest, abdomen, and pelvis. These results were called by telephone at the time of interpretation on 02/04/2022 at 10:49 am to provider Mechele Claude, PA-C, who verbally acknowledged these results. Electronically Signed   By: Maurine Simmering M.D.   On: 02/04/2022 10:56   DG Hip Unilat W or Wo Pelvis 2-3 Views Left  Result Date: 02/03/2022 CLINICAL DATA:  Left hip pain after fall EXAM: DG HIP (WITH OR WITHOUT PELVIS) 2-3V LEFT COMPARISON:  12/22/2021 FINDINGS: Acute displaced  subcapital left femoral neck fracture. The distal femoral shaft is displaced superiorly and laterally. The left femoral head remains seated within the left acetabulum. IMPRESSION: Acute subcapital left femoral neck fracture. Electronically Signed   By: Placido Sou M.D.   On: 02/03/2022 17:31        Scheduled Meds:  ascorbic acid  250 mg Oral Daily   B-complex with vitamin C  1 tablet Oral Daily   chlorhexidine  60 mL Topical Once   cholecalciferol  1,000 Units Oral Daily   gabapentin  300 mg Oral TID   heparin  5,000 Units Subcutaneous Q8H   multivitamin with minerals  1 tablet Oral Daily   povidone-iodine  2 Application Topical Once   Continuous Infusions:  [START ON 02/06/2022]  ceFAZolin (ANCEF) IV     tranexamic acid       LOS: 2 days    Time spent: 55 minutes.    Dana Allan, MD  [facility] Pager #: [phone] 7PM-7AM contact night coverage as above

## 2022-02-05 NOTE — Progress Notes (Signed)
Subjective:     Patient reports pain as moderate. He has not had nerve block yet. His CT left hip notable for lytic lesions in the area of the fracture and the superior pubic ramus with enlarged lymph nodes which is consistent with his antecedent hip pain. He is getting a more comprehensive workup for systemic disease by primary team. CT bony pelvis and left femur ordered as well for preoperative planning.  Objective:   VITALS:  Temp:  [99.9 F (37.7 C)-100.7 F (38.2 C)] 99.9 F (37.7 C) (12/26 0842) Pulse Rate:  [91-93] 91 (12/26 0842) BP: (159-164)/(82) 164/82 (12/26 0842) SpO2:  [91 %-92 %] 91 % (12/26 0842)  Physical Exam  Gen: AAOx3, NAD Comfortable at rest  Left Lower Extremity: Skin intact Shortened, ER+ TTP over left hip ADF/APF/EHL 5/5 SILT throughout DP, PT 2+ to palp CR < 2s   LABS Recent Labs    02/03/22 1821 02/04/22 0316  HGB 12.8* 11.9*  WBC 13.0* 8.3  PLT 418* 360   Recent Labs    02/03/22 1821 02/04/22 0316  NA 138 134*  K 4.0 4.1  CL 103 102  CO2 23 24  BUN 16 17  CREATININE 0.82 0.79  GLUCOSE 178* 145*   Recent Labs    02/03/22 1821  INR 1.2     Assessment/Plan: Ortho consult for acute displaced subcapital left femoral neck fracture sustained 02/03/22, with imaging concerning for pathologic fx   -history, exam and imaging reviewed at length with patient -he will require left hip arthroplasty, will review with joints colleagues to determine OR timing - likely 12/27 with Dr. Zachery Dakins -please page anesthesia for nerve block to be done asap -preop clearance/workup per primary team -CT bony pelvis and CT left femur for preop planning -PT/OT postop  Joseph Hogan 02/05/2022, 11:25 AM

## 2022-02-06 ENCOUNTER — Other Ambulatory Visit: Payer: Self-pay

## 2022-02-06 ENCOUNTER — Inpatient Hospital Stay (HOSPITAL_COMMUNITY): Admission: RE | Admit: 2022-02-06 | Payer: Medicare HMO | Source: Home / Self Care | Admitting: Orthopedic Surgery

## 2022-02-06 ENCOUNTER — Encounter (HOSPITAL_COMMUNITY)
Admission: EM | Disposition: A | Discharge status: Home Health | Payer: Self-pay | Source: Home / Self Care | Attending: Internal Medicine

## 2022-02-06 ENCOUNTER — Inpatient Hospital Stay (HOSPITAL_COMMUNITY): Payer: Medicare HMO

## 2022-02-06 ENCOUNTER — Inpatient Hospital Stay (HOSPITAL_COMMUNITY): Payer: Medicare HMO | Admitting: Anesthesiology

## 2022-02-06 ENCOUNTER — Encounter (HOSPITAL_COMMUNITY): Payer: Self-pay | Admitting: Family Medicine

## 2022-02-06 DIAGNOSIS — M899 Disorder of bone, unspecified: Secondary | ICD-10-CM

## 2022-02-06 DIAGNOSIS — M84452A Pathological fracture, left femur, initial encounter for fracture: Secondary | ICD-10-CM | POA: Diagnosis not present

## 2022-02-06 DIAGNOSIS — I10 Essential (primary) hypertension: Secondary | ICD-10-CM

## 2022-02-06 DIAGNOSIS — D649 Anemia, unspecified: Secondary | ICD-10-CM

## 2022-02-06 DIAGNOSIS — S72002A Fracture of unspecified part of neck of left femur, initial encounter for closed fracture: Secondary | ICD-10-CM | POA: Diagnosis not present

## 2022-02-06 DIAGNOSIS — E119 Type 2 diabetes mellitus without complications: Secondary | ICD-10-CM

## 2022-02-06 HISTORY — PX: TOTAL HIP ARTHROPLASTY: SHX124

## 2022-02-06 LAB — KAPPA/LAMBDA LIGHT CHAINS
Kappa free light chain: 28.6 mg/L — ABNORMAL HIGH (ref 3.3–19.4)
Kappa, lambda light chain ratio: 1.48 (ref 0.26–1.65)
Lambda free light chains: 19.3 mg/L (ref 5.7–26.3)

## 2022-02-06 LAB — CBC WITH DIFFERENTIAL/PLATELET
Abs Immature Granulocytes: 0.08 10*3/uL — ABNORMAL HIGH (ref 0.00–0.07)
Basophils Absolute: 0.1 10*3/uL (ref 0.0–0.1)
Basophils Relative: 1 %
Eosinophils Absolute: 0.1 10*3/uL (ref 0.0–0.5)
Eosinophils Relative: 1 %
HCT: 38.8 % — ABNORMAL LOW (ref 39.0–52.0)
Hemoglobin: 12.9 g/dL — ABNORMAL LOW (ref 13.0–17.0)
Immature Granulocytes: 1 %
Lymphocytes Relative: 8 %
Lymphs Abs: 0.9 10*3/uL (ref 0.7–4.0)
MCH: 28.3 pg (ref 26.0–34.0)
MCHC: 33.2 g/dL (ref 30.0–36.0)
MCV: 85.1 fL (ref 80.0–100.0)
Monocytes Absolute: 1.3 10*3/uL — ABNORMAL HIGH (ref 0.1–1.0)
Monocytes Relative: 12 %
Neutro Abs: 8.7 10*3/uL — ABNORMAL HIGH (ref 1.7–7.7)
Neutrophils Relative %: 77 %
Platelets: 372 10*3/uL (ref 150–400)
RBC: 4.56 MIL/uL (ref 4.22–5.81)
RDW: 12.6 % (ref 11.5–15.5)
WBC: 11.2 10*3/uL — ABNORMAL HIGH (ref 4.0–10.5)
nRBC: 0 % (ref 0.0–0.2)

## 2022-02-06 LAB — RENAL FUNCTION PANEL
Albumin: 2.9 g/dL — ABNORMAL LOW (ref 3.5–5.0)
Anion gap: 10 (ref 5–15)
BUN: 22 mg/dL (ref 8–23)
CO2: 25 mmol/L (ref 22–32)
Calcium: 8.8 mg/dL — ABNORMAL LOW (ref 8.9–10.3)
Chloride: 100 mmol/L (ref 98–111)
Creatinine, Ser: 1.07 mg/dL (ref 0.61–1.24)
GFR, Estimated: >60 mL/min (ref >60)
Glucose, Bld: 131 mg/dL — ABNORMAL HIGH (ref 70–99)
Phosphorus: 5.3 mg/dL — ABNORMAL HIGH (ref 2.5–4.6)
Potassium: 4.2 mmol/L (ref 3.5–5.1)
Sodium: 135 mmol/L (ref 135–145)

## 2022-02-06 LAB — TYPE AND SCREEN
ABO/RH(D): O POS
Antibody Screen: NEGATIVE

## 2022-02-06 LAB — PSA (REFLEX TO FREE) (SERIAL): Prostate Specific Ag, Serum: 1.8 ng/mL (ref 0.0–4.0)

## 2022-02-06 LAB — MRSA NEXT GEN BY PCR, NASAL: MRSA by PCR Next Gen: NOT DETECTED

## 2022-02-06 LAB — MAGNESIUM: Magnesium: 1.9 mg/dL (ref 1.7–2.4)

## 2022-02-06 SURGERY — ARTHROPLASTY, HIP, TOTAL,POSTERIOR APPROACH
Anesthesia: General | Site: Hip | Laterality: Left

## 2022-02-06 MED ORDER — ONDANSETRON HCL 4 MG/2ML IJ SOLN: 4.0000 mg | Freq: Once | INTRAMUSCULAR | Status: DC | PRN

## 2022-02-06 MED ORDER — ONDANSETRON HCL 4 MG/2ML IJ SOLN
INTRAMUSCULAR | Status: DC | PRN
  Administered 2022-02-06: 4 mg via INTRAVENOUS

## 2022-02-06 MED ORDER — CEFAZOLIN SODIUM-DEXTROSE 2-4 GM/100ML-% IV SOLN
2.0000 g | Freq: Three times a day (TID) | INTRAVENOUS | Status: AC
  Administered 2022-02-07 (×2): 2 g via INTRAVENOUS
  Filled 2022-02-06 (×2): qty 100

## 2022-02-06 MED ORDER — ACETAMINOPHEN 10 MG/ML IV SOLN
INTRAVENOUS | Status: DC | PRN
  Administered 2022-02-06: 1000 mg via INTRAVENOUS

## 2022-02-06 MED ORDER — MIDAZOLAM HCL 2 MG/2ML IJ SOLN
INTRAMUSCULAR | Status: DC | PRN
  Administered 2022-02-06: 2 mg via INTRAVENOUS

## 2022-02-06 MED ORDER — ISOPROPYL ALCOHOL 70 % SOLN
Status: DC | PRN
  Administered 2022-02-06: 1 via TOPICAL

## 2022-02-06 MED ORDER — PROPOFOL 10 MG/ML IV BOLUS
INTRAVENOUS | Status: DC | PRN
  Administered 2022-02-06: 180 mg via INTRAVENOUS

## 2022-02-06 MED ORDER — DEXAMETHASONE SODIUM PHOSPHATE 10 MG/ML IJ SOLN
INTRAMUSCULAR | Status: AC
  Filled 2022-02-06: qty 1

## 2022-02-06 MED ORDER — SUGAMMADEX SODIUM 200 MG/2ML IV SOLN
INTRAVENOUS | Status: DC | PRN
  Administered 2022-02-06: 200 mg via INTRAVENOUS

## 2022-02-06 MED ORDER — SODIUM CHLORIDE (PF) 0.9 % IJ SOLN
INTRAMUSCULAR | Status: AC
  Filled 2022-02-06: qty 10

## 2022-02-06 MED ORDER — 0.9 % SODIUM CHLORIDE (POUR BTL) OPTIME
TOPICAL | Status: DC | PRN
  Administered 2022-02-06: 1000 mL

## 2022-02-06 MED ORDER — SODIUM CHLORIDE 0.9 % IR SOLN
Status: DC | PRN
  Administered 2022-02-06: 1000 mL

## 2022-02-06 MED ORDER — LACTATED RINGERS IV SOLN: INTRAVENOUS | Status: DC

## 2022-02-06 MED ORDER — ISOPROPYL ALCOHOL 70 % SOLN
Status: AC
  Filled 2022-02-06: qty 480

## 2022-02-06 MED ORDER — FENTANYL CITRATE (PF) 100 MCG/2ML IJ SOLN
INTRAMUSCULAR | Status: AC
  Filled 2022-02-06: qty 2

## 2022-02-06 MED ORDER — ACETAMINOPHEN 10 MG/ML IV SOLN
INTRAVENOUS | Status: AC
  Filled 2022-02-06: qty 100

## 2022-02-06 MED ORDER — MIDAZOLAM HCL 2 MG/2ML IJ SOLN
INTRAMUSCULAR | Status: AC
  Filled 2022-02-06: qty 2

## 2022-02-06 MED ORDER — SODIUM CHLORIDE (PF) 0.9 % IJ SOLN
INTRAMUSCULAR | Status: DC | PRN
  Administered 2022-02-06: 60 mL

## 2022-02-06 MED ORDER — FENTANYL CITRATE PF 50 MCG/ML IJ SOSY
100.0000 ug | PREFILLED_SYRINGE | Freq: Once | INTRAMUSCULAR | Status: AC
  Administered 2022-02-06: 100 ug via INTRAVENOUS
  Filled 2022-02-06: qty 2

## 2022-02-06 MED ORDER — ACETAMINOPHEN 10 MG/ML IV SOLN: 1000.0000 mg | Freq: Once | INTRAVENOUS | Status: DC | PRN

## 2022-02-06 MED ORDER — FENTANYL CITRATE (PF) 100 MCG/2ML IJ SOLN
INTRAMUSCULAR | Status: DC | PRN
  Administered 2022-02-06: 50 ug via INTRAVENOUS
  Administered 2022-02-06: 100 ug via INTRAVENOUS
  Administered 2022-02-06: 50 ug via INTRAVENOUS
  Administered 2022-02-06 (×3): 100 ug via INTRAVENOUS
  Administered 2022-02-06 (×2): 50 ug via INTRAVENOUS

## 2022-02-06 MED ORDER — CHLORHEXIDINE GLUCONATE 0.12 % MT SOLN
15.0000 mL | Freq: Once | OROMUCOSAL | Status: AC
  Administered 2022-02-06: 15 mL via OROMUCOSAL

## 2022-02-06 MED ORDER — OXYCODONE HCL 5 MG PO TABS: 5.0000 mg | ORAL_TABLET | Freq: Once | ORAL | Status: DC | PRN

## 2022-02-06 MED ORDER — OXYCODONE HCL 5 MG/5ML PO SOLN: 5.0000 mg | Freq: Once | ORAL | Status: DC | PRN

## 2022-02-06 MED ORDER — WATER FOR IRRIGATION, STERILE IR SOLN
Status: DC | PRN
  Administered 2022-02-06: 2000 mL

## 2022-02-06 MED ORDER — HYDROMORPHONE HCL 1 MG/ML IJ SOLN: 0.2500 mg | INTRAMUSCULAR | Status: DC | PRN

## 2022-02-06 MED ORDER — ROCURONIUM BROMIDE 10 MG/ML (PF) SYRINGE
PREFILLED_SYRINGE | INTRAVENOUS | Status: AC
  Filled 2022-02-06: qty 10

## 2022-02-06 MED ORDER — HYDROMORPHONE HCL 1 MG/ML IJ SOLN
INTRAMUSCULAR | Status: AC
  Filled 2022-02-06: qty 2

## 2022-02-06 MED ORDER — SODIUM CHLORIDE (PF) 0.9 % IJ SOLN
INTRAMUSCULAR | Status: AC
  Filled 2022-02-06: qty 50

## 2022-02-06 MED ORDER — ASPIRIN 81 MG PO TBEC
81.0000 mg | DELAYED_RELEASE_TABLET | Freq: Two times a day (BID) | ORAL | Status: DC
  Administered 2022-02-07 – 2022-02-12 (×11): 81 mg via ORAL
  Filled 2022-02-06 (×11): qty 1

## 2022-02-06 MED ORDER — ONDANSETRON HCL 4 MG/2ML IJ SOLN
INTRAMUSCULAR | Status: AC
  Filled 2022-02-06: qty 2

## 2022-02-06 MED ORDER — BUPIVACAINE LIPOSOME 1.3 % IJ SUSP
INTRAMUSCULAR | Status: DC | PRN
  Administered 2022-02-06: 20 mL

## 2022-02-06 MED ORDER — ROCURONIUM BROMIDE 10 MG/ML (PF) SYRINGE
PREFILLED_SYRINGE | INTRAVENOUS | Status: DC | PRN
  Administered 2022-02-06: 80 mg via INTRAVENOUS
  Administered 2022-02-06: 20 mg via INTRAVENOUS

## 2022-02-06 MED ORDER — FENTANYL CITRATE (PF) 250 MCG/5ML IJ SOLN
INTRAMUSCULAR | Status: AC
  Filled 2022-02-06: qty 5

## 2022-02-06 MED ORDER — LIDOCAINE HCL (PF) 2 % IJ SOLN
INTRAMUSCULAR | Status: AC
  Filled 2022-02-06: qty 5

## 2022-02-06 MED ORDER — TRANEXAMIC ACID-NACL 1000-0.7 MG/100ML-% IV SOLN
INTRAVENOUS | Status: DC | PRN
  Administered 2022-02-06: 1000 mg via INTRAVENOUS

## 2022-02-06 MED ORDER — PROPOFOL 10 MG/ML IV BOLUS
INTRAVENOUS | Status: AC
  Filled 2022-02-06: qty 20

## 2022-02-06 MED ORDER — DEXAMETHASONE SODIUM PHOSPHATE 10 MG/ML IJ SOLN
INTRAMUSCULAR | Status: DC | PRN
  Administered 2022-02-06: 5 mg via INTRAVENOUS

## 2022-02-06 MED ORDER — BUPIVACAINE LIPOSOME 1.3 % IJ SUSP
INTRAMUSCULAR | Status: AC
  Filled 2022-02-06: qty 20

## 2022-02-06 MED ORDER — LIDOCAINE HCL (CARDIAC) PF 100 MG/5ML IV SOSY
PREFILLED_SYRINGE | INTRAVENOUS | Status: DC | PRN
  Administered 2022-02-06: 100 mg via INTRAVENOUS

## 2022-02-06 SURGICAL SUPPLY — 66 items
BAG COUNTER SPONGE SURGICOUNT (BAG) IMPLANT
BAG DECANTER FOR FLEXI CONT (MISCELLANEOUS) ×1 IMPLANT
BAG ZIPLOCK 12X15 (MISCELLANEOUS) ×1 IMPLANT
BLADE SAW SAG 25X90X1.19 (BLADE) ×1 IMPLANT
CHLORAPREP W/TINT 26 (MISCELLANEOUS) ×2 IMPLANT
COVER SURGICAL LIGHT HANDLE (MISCELLANEOUS) ×1 IMPLANT
DERMABOND ADVANCED .7 DNX12 (GAUZE/BANDAGES/DRESSINGS) ×1 IMPLANT
DRAPE HIP W/POCKET STRL (MISCELLANEOUS) ×1 IMPLANT
DRAPE INCISE IOBAN 66X45 STRL (DRAPES) ×1 IMPLANT
DRAPE INCISE IOBAN 85X60 (DRAPES) ×1 IMPLANT
DRAPE POUCH INSTRU U-SHP 10X18 (DRAPES) ×1 IMPLANT
DRAPE SHEET LG 3/4 BI-LAMINATE (DRAPES) ×3 IMPLANT
DRAPE SURG 17X11 SM STRL (DRAPES) ×1 IMPLANT
DRAPE U-SHAPE 47X51 STRL (DRAPES) ×2 IMPLANT
DRESSING AQUACEL AG SP 3.5X10 (GAUZE/BANDAGES/DRESSINGS) ×1 IMPLANT
DRSG AQUACEL AG ADV 3.5X10 (GAUZE/BANDAGES/DRESSINGS) IMPLANT
DRSG AQUACEL AG SP 3.5X10 (GAUZE/BANDAGES/DRESSINGS) ×1
ELECT BLADE TIP CTD 4 INCH (ELECTRODE) ×1 IMPLANT
ELECT REM PT RETURN 15FT ADLT (MISCELLANEOUS) ×1 IMPLANT
GLOVE BIO SURGEON STRL SZ 6.5 (GLOVE) ×2 IMPLANT
GLOVE BIOGEL PI IND STRL 6.5 (GLOVE) ×1 IMPLANT
GLOVE BIOGEL PI IND STRL 8 (GLOVE) ×1 IMPLANT
GLOVE SURG ORTHO 8.0 STRL STRW (GLOVE) ×2 IMPLANT
GOWN STRL REUS W/ TWL XL LVL3 (GOWN DISPOSABLE) ×2 IMPLANT
GOWN STRL REUS W/TWL XL LVL3 (GOWN DISPOSABLE) ×2
HANDPIECE INTERPULSE COAX TIP (DISPOSABLE)
HEAD CERAMIC FEMORAL 36MM (Head) IMPLANT
HOLDER FOLEY CATH W/STRAP (MISCELLANEOUS) ×1 IMPLANT
HOOD PEEL AWAY T7 (MISCELLANEOUS) ×3 IMPLANT
INSERT 0 DEG POLY  36 F (Miscellaneous) ×1 IMPLANT
INSERT 0 DEG POLY 36 F (Miscellaneous) IMPLANT
JET LAVAGE IRRISEPT WOUND (IRRIGATION / IRRIGATOR)
KIT BASIN OR (CUSTOM PROCEDURE TRAY) ×1 IMPLANT
KIT TURNOVER KIT A (KITS) IMPLANT
LAVAGE JET IRRISEPT WOUND (IRRIGATION / IRRIGATOR) IMPLANT
MANIFOLD NEPTUNE II (INSTRUMENTS) ×1 IMPLANT
MARKER SKIN DUAL TIP RULER LAB (MISCELLANEOUS) ×1 IMPLANT
NEEDLE HYPO 22GX1.5 SAFETY (NEEDLE) IMPLANT
NS IRRIG 1000ML POUR BTL (IV SOLUTION) ×1 IMPLANT
PACK TOTAL JOINT (CUSTOM PROCEDURE TRAY) ×1 IMPLANT
PRESSURIZER FEMORAL UNIV (MISCELLANEOUS) IMPLANT
PROTECTOR NERVE ULNAR (MISCELLANEOUS) ×1 IMPLANT
RETRIEVER SUT HEWSON (MISCELLANEOUS) ×1 IMPLANT
SCREW HEX LP 6.5X20 (Screw) IMPLANT
SCREW HEX LP 6.5X30 (Screw) IMPLANT
SEALER BIPOLAR AQUA 6.0 (INSTRUMENTS) ×1 IMPLANT
SET HNDPC FAN SPRY TIP SCT (DISPOSABLE) IMPLANT
SHELL TRIDENT II CLUST SZ 56MM (Shell) IMPLANT
SPIKE FLUID TRANSFER (MISCELLANEOUS) ×3 IMPLANT
STEM ACCOLADE SZ 6 (Hips) IMPLANT
SUCTION FRAZIER HANDLE 12FR (TUBING) ×1
SUCTION TUBE FRAZIER 12FR DISP (TUBING) ×1 IMPLANT
SUT BONE WAX W31G (SUTURE) ×1 IMPLANT
SUT ETHIBOND #5 BRAIDED 30INL (SUTURE) ×1 IMPLANT
SUT MNCRL AB 3-0 PS2 18 (SUTURE) ×1 IMPLANT
SUT STRATAFIX 0 PDS 27 VIOLET (SUTURE) ×1
SUT STRATAFIX PDO 1 14 VIOLET (SUTURE) ×1
SUT STRATFX PDO 1 14 VIOLET (SUTURE) ×1
SUT VIC AB 2-0 CT2 27 (SUTURE) ×2 IMPLANT
SUTURE STRATFX 0 PDS 27 VIOLET (SUTURE) ×1 IMPLANT
SUTURE STRATFX PDO 1 14 VIOLET (SUTURE) ×1 IMPLANT
SYR 20ML LL LF (SYRINGE) ×2 IMPLANT
TOWEL OR 17X26 10 PK STRL BLUE (TOWEL DISPOSABLE) ×1 IMPLANT
TRAY FOLEY MTR SLVR 16FR STAT (SET/KITS/TRAYS/PACK) ×1 IMPLANT
UNDERPAD 30X36 HEAVY ABSORB (UNDERPADS AND DIAPERS) ×1 IMPLANT
WATER STERILE IRR 1000ML POUR (IV SOLUTION) ×2 IMPLANT

## 2022-02-06 NOTE — Transfer of Care (Signed)
Immediate Anesthesia Transfer of Care Note  Patient: Joseph Hogan  Procedure(s) Performed: TOTAL HIP ARTHROPLASTY (Left: Hip)  Patient Location: PACU  Anesthesia Type:General  Level of Consciousness: drowsy  Airway & Oxygen Therapy: Patient Spontanous Breathing and Patient connected to face mask oxygen  Post-op Assessment: Report given to RN and Post -op Vital signs reviewed and stable  Post vital signs: Reviewed and stable  Last Vitals:  Vitals Value Taken Time  BP 137/86 02/06/22 1931  Temp    Pulse 75 02/06/22 1935  Resp 10 02/06/22 1935  SpO2 90 % 02/06/22 1935  Vitals shown include unvalidated device data.  Last Pain:  Vitals:   02/06/22 1527  TempSrc:   PainSc: 1       Patients Stated Pain Goal: 0 (26/20/35 5974)  Complications: No notable events documented.

## 2022-02-06 NOTE — Anesthesia Procedure Notes (Signed)
Procedure Name: Intubation Date/Time: 02/06/2022 5:10 PM  Performed by: Raenette Rover, CRNAPre-anesthesia Checklist: Patient identified, Emergency Drugs available, Suction available and Patient being monitored Patient Re-evaluated:Patient Re-evaluated prior to induction Oxygen Delivery Method: Circle system utilized Preoxygenation: Pre-oxygenation with 100% oxygen Induction Type: IV induction Ventilation: Mask ventilation without difficulty Laryngoscope Size: Mac and 4 Grade View: Grade II Tube type: Oral Tube size: 7.5 mm Number of attempts: 1 Airway Equipment and Method: Stylet Placement Confirmation: ETT inserted through vocal cords under direct vision, positive ETCO2 and breath sounds checked- equal and bilateral Secured at: 24 cm Tube secured with: Tape Dental Injury: Teeth and Oropharynx as per pre-operative assessment

## 2022-02-06 NOTE — Plan of Care (Signed)
  Problem: Education: Goal: Knowledge of General Education information will improve Description: Including pain rating scale, medication(s)/side effects and non-pharmacologic comfort measures Outcome: Progressing   Problem: Safety: Goal: Ability to remain free from injury will improve Outcome: Progressing   Problem: Education: Goal: Knowledge of the prescribed therapeutic regimen will improve Outcome: Progressing   Problem: Activity: Goal: Ability to tolerate increased activity will improve Outcome: Progressing   Problem: Pain Management: Goal: Pain level will decrease with appropriate interventions Outcome: Progressing

## 2022-02-06 NOTE — Progress Notes (Signed)
PROGRESS NOTE    Joseph Hogan  FWY:[id number] DOB: March 16, 1960 DOA: 02/03/2022 PCP: Leonie Douglas, MD  Outpatient Specialists:     Brief Narrative:  Patient is a 61 year old male male with medical history significant of arthritis, lumbar radiculopathy, hypertension, prediabetes, not taking any medications, but instead Taking natural remedies.  Patient was admitted with left hip pain following a mechanical fall..  Apparently, patient was ambulating with a walker secondary to lumbar radicular pain.  Patient was trying to go up into his home with a walker when he had a mechanical slip and fall.  Patient's pain was very difficult to control the ED. patient required multiple doses of Dilaudid, a dose of fentanyl, dose of ketamine, dose of Toradol.  Blood pressure was also significantly elevated.  CT scan of the left hip done revealed acute pathological fracture of the left femoral neck with displacement and angulation, with underlying lytic lesions in the femoral head and neck.  Patient is also being worked up for possible metastasis to the bones.  CT scan of the chest, abdomen and pelvis is pending.  I suspect this CT scan was not done due to significant pain.  Myeloma panel is pending (currently follow-up ordered workup).  Please have low threshold to consult oncology team.  Orthopedic team is directing patient's pain management.  At some point earlier today, orthopedic team suggested possible nerve block by anesthesia.  Patient was ambulatory prior to the fall.  No reported chest pain or dyspnea on exertion prior to the fall.  Orthopedic team has advised the patient to be transferred to [facility] for possible surgery tomorrow, Wednesday, February 06, 2022.  CT scan of the left hip revealed: Acute pathologic fracture of the left femoral neck with displacement and angulation, with underlying lytic lesions in the femoral head and neck.   Additional lytic destructive lesion of the left  parasymphyseal pubic bone extending throughout the superior pubic ramus to the puboacetabular junction, with pathologic fracture of the superior pubic ramus. Adjacent hypoattenuation in the pelvic component of the obturator internus muscle, suspicious for tumor involvement. Probable small lytic lesion in the inferior pubic ramus.   Prominent left external iliac lymph nodes measuring up to 1.2 cm.   Recommend malignancy workup including SPEP/UPEP and CT of the chest, abdomen, and pelvis.  02/05/2022: Patient seen.  Patient continues to report pain.  02/06/2022: Patient seen.  Patient is awaiting to be transferred to [facility] to full surgery.  Patient underwent nerve block last evening.  Awaiting myeloma workup.  Have a low threshold to consult oncology team to assist in patient's workup.  CT chest/abdomen/pelvis revealed: 1. Pathologic retroperitoneal and bilateral pelvic adenopathy, with lytic destructive lesions of the left superior and inferior pubic ramus and pubic body as well as a pathologic fracture through the left femoral neck. Pathologic fracture anteriorly in the right second rib. Deformity anteriorly in the right third rib probably from early pathologic fracture. Metastatic disease or myeloma strongly favored over multifocal osteomyelitis as a cause. 2. Mildly enlarged left supraclavicular lymph node and lower thoracic periaortic lymph node. Pathologic retroperitoneal and pelvic adenopathy. 3. 10 cm long segment of rectal wall thickening, possibly inflammatory although rectal tumor is a differential diagnostic consideration. 4. 1.1 cm exophytic lesion from the left mid kidney posterolaterally, nonspecific for complex cyst versus tumor. This could be further assessed with dedicated renal protocol MRI with and without contrast if clinically warranted, although such a small lesion is highly unlikely to be the  cause of the adenopathy and bony lesions. 5.  Bandlike atelectasis in both lungs. 6. Small type 1 hiatal hernia. 7. Lumbar spondylosis and degenerative disc disease causing generally mild multilevel impingement. 8. Sigmoid colon diverticulosis. 9. Wall thickening in the proximal stomach body, probably secondary to nondistention.       Assessment & Plan:   Principal Problem:   Hip fracture ([place]) Active Problems:   Lumbar radiculopathy   Leukocytosis   Lytic bone lesion of femur   Hip fracture ([facility]) - Mechanical slip and - CT left hip shows acute subcapital left femoral neck fracture, as well as, lytic bone lesions in the pelvic area. - Ortho consulted. - Patient will be transferred to [facility] for possible surgery on 02/06/2022.   -Continue to optimize pain control.  Orthopedic team is managing.   -S/p nerve block.     -Workup for likely metastatic bone disease is in progress.   Leukocytosis -Leukocytosis has resolved.  Repeat WBC was 8.   -No constitutional symptoms.   Lumbar radiculopathy - Apparently, chiropractor has been managing patient's lumbar radiculopathy.  A -Continue gabapentin. -Continue to optimize pain control.   Anemia: -Normocytic. -Last hemoglobin was 12.9 g/dL.     Lytic bone lesions: -Worrisome for metastatic bone disease. -Follow myeloma panel and PSA.   -Follow CT chest, abdomen and pelvis with contrast.   DVT prophylaxis: Subcutaneous heparin Code Status: Full code Family Communication:  Disposition Plan: This will depend on hospital course   Consultants:  Orthopedic consult. Low threshold to consult the oncology team.    Procedures:  None follow-up.  Antimicrobials:  None   Subjective: Left hip pain is better controlled. No other constitutional symptoms endorsed.      Objective: Vitals:   02/05/22 1815 02/05/22 2019 02/06/22 0509 02/06/22 0724  BP: (!) 143/74 (!) 164/88 (!) 170/91 (!) 150/88  Pulse:  86 92 85  Resp: '18 18 18 17  '$ Temp: (!) 101.1 F  (38.4 C) 98.2 F (36.8 C) 99.8 F (37.7 C) 98.2 F (36.8 C)  TempSrc:  Oral Oral   SpO2: 94% 93% 95% 95%  Weight:      Height:        Intake/Output Summary (Last 24 hours) at 02/06/2022 1233 Last data filed at 02/06/2022 1100 Gross per 24 hour  Intake 0 ml  Output 650 ml  Net -650 ml   Filed Weights   02/03/22 1531  Weight: 108.9 kg    Examination:  General exam: Appears calm and comfortable.  Patient is obese.  Patient is pale. Respiratory system: Clear to auscultation.  Cardiovascular system: S1 & S2 heard, systolic murmur with increased intensity of S2 component of the heart sound. Gastrointestinal system: Abdomen is obese, soft and nontender.   Central nervous system: Alert and oriented. No focal neurological deficits. Extremities: Fullness of the ankle.  Data Reviewed: I have personally reviewed following labs and imaging studies  CBC: Recent Labs  Lab 02/03/22 1821 02/04/22 0316 02/06/22 0311  WBC 13.0* 8.3 11.2*  NEUTROABS 11.6* 6.7 8.7*  HGB 12.8* 11.9* 12.9*  HCT 37.7* 35.0* 38.8*  MCV 84.0 83.1 85.1  PLT 418* 360 235    Basic Metabolic Panel: Recent Labs  Lab 02/03/22 1821 02/04/22 0316 02/06/22 0311  NA 138 134* 135  K 4.0 4.1 4.2  CL 103 102 100  CO2 '23 24 25  '$ GLUCOSE 178* 145* 131*  BUN '16 17 22  '$ CREATININE 0.82 0.79 1.07  CALCIUM 9.0 8.8* 8.8*  MG  --  2.0 1.9  PHOS  --   --  5.3*    GFR: Estimated Creatinine Clearance: 88.7 mL/min (by C-G formula based on SCr of 1.07 mg/dL). Liver Function Tests: Recent Labs  Lab 02/04/22 0316 02/06/22 0311  AST 24  --   ALT 18  --   ALKPHOS 100  --   BILITOT 0.3  --   PROT 6.8  --   ALBUMIN 3.2* 2.9*    No results for input(s): "LIPASE", "AMYLASE" in the last 168 hours. No results for input(s): "AMMONIA" in the last 168 hours. Coagulation Profile: Recent Labs  Lab 02/03/22 1821  INR 1.2    Cardiac Enzymes: No results for input(s): "CKTOTAL", "CKMB", "CKMBINDEX", "TROPONINI" in  the last 168 hours. BNP (last 3 results) No results for input(s): "PROBNP" in the last 8760 hours. HbA1C: No results for input(s): "HGBA1C" in the last 72 hours. CBG: No results for input(s): "GLUCAP" in the last 168 hours. Lipid Profile: No results for input(s): "CHOL", "HDL", "LDLCALC", "TRIG", "CHOLHDL", "LDLDIRECT" in the last 72 hours. Thyroid Function Tests: Recent Labs    02/04/22 0316  TSH 2.026    Anemia Panel: No results for input(s): "VITAMINB12", "FOLATE", "FERRITIN", "TIBC", "IRON", "RETICCTPCT" in the last 72 hours. Urine analysis:    Component Value Date/Time   BILIRUBINUR SMALL 02/13/2016 1437   PROTEINUR 30 02/13/2016 1437   UROBILINOGEN 0.2 02/13/2016 1437   NITRITE NEG 02/13/2016 1437   LEUKOCYTESUR Negative 02/13/2016 1437   Sepsis Labs: '@LABRCNTIP'$ (procalcitonin:4,lacticidven:4)  ) Recent Results (from the past 240 hour(s))  MRSA Next Gen by PCR, Nasal     Status: None   Collection Time: 02/05/22 10:42 PM   Specimen: Nasal Mucosa; Nasal Swab  Result Value Ref Range Status   MRSA by PCR Next Gen NOT DETECTED NOT DETECTED Final    Comment: (NOTE) The GeneXpert MRSA Assay (FDA approved for NASAL specimens only), is one component of a comprehensive MRSA colonization surveillance program. It is not intended to diagnose MRSA infection nor to guide or monitor treatment for MRSA infections. Test performance is not FDA approved in patients less than 44 years old. Performed at [facility], [place] [address] [place],  [address]          Radiology Studies: CT CHEST ABDOMEN PELVIS W CONTRAST  Result Date: 02/05/2022 CLINICAL DATA:  Lytic osseous metastatic lesions in the pelvis and left hip, workup for primary tumor and other metastatic disease. * Tracking Code: BO * EXAM: CT CHEST, ABDOMEN, AND PELVIS WITH CONTRAST TECHNIQUE: Multidetector CT imaging of the chest, abdomen and pelvis was performed following the standard protocol during bolus  administration of intravenous contrast. RADIATION DOSE REDUCTION: This exam was performed according to the departmental dose-optimization program which includes automated exposure control, adjustment of the mA and/or kV according to patient size and/or use of iterative reconstruction technique. CONTRAST:  46m OMNIPAQUE IOHEXOL 350 MG/ML SOLN COMPARISON:  CT left hip 02/04/2022 FINDINGS: CT CHEST FINDINGS Cardiovascular: Unremarkable Mediastinum/Nodes: Left supraclavicular node 1.2 cm in short axis, image 3 series 4. Small type 1 hiatal hernia. Lower thoracic periaortic lymph node 0.8 cm in short axis on image 51 series 4. Lungs/Pleura: Bandlike atelectasis especially dependently in both lower lobes and to a lesser degree in the lingula and posteriorly in the right upper lobe. No well-defined pulmonary nodules. Musculoskeletal: Pathologic fracture anteriorly in the fourth rib, image 52 series 6. Deformity anteriorly in the right second rib probably from early pathologic fracture, image 33 series 6.  Marrow heterogeneity in the third rib anteriorly on image 44 series 6 without visible fracture. Thoracic spondylosis. Small lucency inferiorly in the T7 vertebral body, image 80 series 8, probably a Schmorl's node. CT ABDOMEN PELVIS FINDINGS Hepatobiliary: No well-defined focal hepatic lesion identified. Gallbladder unremarkable. No biliary dilatation. Pancreas: Unremarkable Spleen: Unremarkable Adrenals/Urinary Tract: No adrenal mass identified. Hypodense right renal lesions favor cysts and measure below 20 Hounsfield units. No further imaging workup of these lesions is indicated. Urinary bladder unremarkable. 1.1 cm exophytic lesion from the left mid kidney posterolaterally internal density 34 Hounsfield units on image 89 series 7, nonspecific for complex cyst versus tumor. Stomach/Bowel: Wall thickening in the proximal stomach body is likely secondary to nondistention. Wall thickening in the rectum as on image 114  series 4 nonspecific and could be inflammatory, with tumor is a less likely differential diagnostic consideration given the fairly long segment of involvement measuring about 10 cm in length. Sigmoid colon diverticulosis. Normal appendix. Redundant transverse colon. Vascular/Lymphatic: Pathologic retroperitoneal adenopathy observed, index periaortic lymph node 1.9 cm in short axis on image 70 series 4. Right common iliac node 1.4 cm in short axis, image 89 series 4. Right external iliac adenopathy noted with index lymph node 1.6 cm in short axis, image 107 series 4. Left external iliac lymph node 1.3 cm in short axis, image 105 series 4. Reproductive: Unremarkable Other: Substantial presacral edema noted, with some trace fluid in lower paracolic gutters bilaterally. Perirectal stranding noted with multiple small perirectal lymph nodes including a 0.8 cm node on image 102 series 4. Musculoskeletal: As noted on dedicated exams, lytic destructive lesions of the left superior and inferior pubic ramus and pubic body are noted along with a pathologic fracture through the left femoral neck 1, and adjacent edema along fascia planes. Left hip joint effusion. Thickening/edema along the left obturator internus muscle. Edema tracks along the left hip adductor musculature. Lumbar spondylosis and degenerative disc disease causing generally mild multilevel impingement. IMPRESSION: 1. Pathologic retroperitoneal and bilateral pelvic adenopathy, with lytic destructive lesions of the left superior and inferior pubic ramus and pubic body as well as a pathologic fracture through the left femoral neck. Pathologic fracture anteriorly in the right second rib. Deformity anteriorly in the right third rib probably from early pathologic fracture. Metastatic disease or myeloma strongly favored over multifocal osteomyelitis as a cause. 2. Mildly enlarged left supraclavicular lymph node and lower thoracic periaortic lymph node. Pathologic  retroperitoneal and pelvic adenopathy. 3. 10 cm long segment of rectal wall thickening, possibly inflammatory although rectal tumor is a differential diagnostic consideration. 4. 1.1 cm exophytic lesion from the left mid kidney posterolaterally, nonspecific for complex cyst versus tumor. This could be further assessed with dedicated renal protocol MRI with and without contrast if clinically warranted, although such a small lesion is highly unlikely to be the cause of the adenopathy and bony lesions. 5. Bandlike atelectasis in both lungs. 6. Small type 1 hiatal hernia. 7. Lumbar spondylosis and degenerative disc disease causing generally mild multilevel impingement. 8. Sigmoid colon diverticulosis. 9. Wall thickening in the proximal stomach body, probably secondary to nondistention. Electronically Signed   By: Van Clines M.D.   On: 02/05/2022 18:36   CT FEMUR LEFT WO CONTRAST  Result Date: 02/05/2022 CLINICAL DATA:  Metastatic disease to the skeleton and left hip EXAM: CT OF THE LOWER LEFT EXTREMITY WITHOUT CONTRAST TECHNIQUE: Multidetector CT imaging of the lower left extremity was performed according to the standard protocol. RADIATION DOSE REDUCTION: This exam was  performed according to the departmental dose-optimization program which includes automated exposure control, adjustment of the mA and/or kV according to patient size and/or use of iterative reconstruction technique. COMPARISON:  Left hip CT 02/04/2022 FINDINGS: Bones/Joint/Cartilage Lytic metastatic involvement in the left superior pubic ramus, left pubic body, and extending into the anterior aspect of the right inferior pubic ramus. Cortical destruction along the medial margin of the left pubis adjacent to the pubic symphysis on image 50 of series 3, along with a pathologic fracture of the superior pubic ramus in the vicinity of the lesion Intra-articular left femoral neck fracture associated with some indistinct lucency in the femoral neck  compatible with a pathologic fracture. Varus angulation noted along with about 0.6 cm medial displacement of the femoral head fragment with respect to the distal fragment. Prominent osteoarthritis of the knee. Extensive tricompartmental spurring and loss of articular space. 8 mm free osteochondral fragment posteriorly in the left knee joint. Moderate degenerative chondral thinning in the left hip joint. No obvious lytic destructive findings in the left femur distal to the femoral neck. Ligaments Suboptimally assessed by CT. Muscles and Tendons Mild edema noted along fascia planes in the vicinity of the femoral neck fracture. Otherwise unremarkable. Soft tissues Presacral edema is partially included. Superficial femoral artery atherosclerotic vascular calcification is present. IMPRESSION: 1. 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 There is a pathologic fracture of the left superior pubic ramus adjacent to the pubic symphysis. 2. Pathologic intra-articular fracture of the left femoral neck with about 0.6 cm of medial displacement of the femoral head fragment with respect to the distal fragment. 3. 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 4. 8 mm free osteochondral fragment posteriorly in the left knee joint. 5. SFA atherosclerotic vascular calcification. 6. Presacral edema is partially included. Electronically Signed   By: Van Clines M.D.   On: 02/05/2022 18:16        Scheduled Meds:  ascorbic acid  250 mg Oral Daily   B-complex with vitamin C  1 tablet Oral Daily   chlorhexidine  60 mL Topical Once   cholecalciferol  1,000 Units Oral Daily   gabapentin  300 mg Oral TID   multivitamin with minerals  1 tablet Oral Daily   povidone-iodine  2 Application Topical Once   Continuous Infusions:   ceFAZolin (ANCEF) IV     tranexamic acid       LOS: 3 days    Time spent: 55  minutes.    Dana Allan, MD  [facility] Pager #: [phone] 7PM-7AM contact night coverage as above

## 2022-02-06 NOTE — Anesthesia Preprocedure Evaluation (Signed)
Anesthesia Evaluation  Patient identified by MRN, date of birth, ID band Patient awake    Reviewed: Allergy & Precautions, H&P , NPO status , Patient's Chart, lab work & pertinent test results  Airway Mallampati: II  TM Distance: >3 FB Neck ROM: Full    Dental no notable dental hx.    Pulmonary neg pulmonary ROS   Pulmonary exam normal breath sounds clear to auscultation       Cardiovascular hypertension, Normal cardiovascular exam Rhythm:Regular Rate:Normal  untreated   Neuro/Psych negative neurological ROS  negative psych ROS   GI/Hepatic negative GI ROS, Neg liver ROS,,,  Endo/Other  diabetes, Type 2  obesity  Renal/GU negative Renal ROS  negative genitourinary   Musculoskeletal negative musculoskeletal ROS (+)    Abdominal   Peds negative pediatric ROS (+)  Hematology  (+) Blood dyscrasia, anemia   Anesthesia Other Findings   Reproductive/Obstetrics negative OB ROS                             Anesthesia Physical Anesthesia Plan  ASA: 3  Anesthesia Plan: General   Post-op Pain Management: Toradol IV (intra-op)*   Induction: Intravenous  PONV Risk Score and Plan: 2 and Ondansetron, Dexamethasone and Treatment may vary due to age or medical condition  Airway Management Planned: Oral ETT  Additional Equipment:   Intra-op Plan:   Post-operative Plan: Extubation in OR  Informed Consent: I have reviewed the patients History and Physical, chart, labs and discussed the procedure including the risks, benefits and alternatives for the proposed anesthesia with the patient or authorized representative who has indicated his/her understanding and acceptance.     Dental advisory given  Plan Discussed with: CRNA and Surgeon  Anesthesia Plan Comments:        Anesthesia Quick Evaluation

## 2022-02-06 NOTE — Discharge Instructions (Signed)

## 2022-02-06 NOTE — Progress Notes (Signed)
Patient on telemetry.

## 2022-02-06 NOTE — Anesthesia Postprocedure Evaluation (Signed)
Anesthesia Post Note  Patient: Joseph Hogan  Procedure(s) Performed: TOTAL HIP ARTHROPLASTY (Left: Hip)     Patient location during evaluation: PACU Anesthesia Type: General Level of consciousness: awake and alert Pain management: pain level controlled Vital Signs Assessment: post-procedure vital signs reviewed and stable Respiratory status: spontaneous breathing, nonlabored ventilation, respiratory function stable and patient connected to nasal cannula oxygen Cardiovascular status: blood pressure returned to baseline and stable Postop Assessment: no apparent nausea or vomiting Anesthetic complications: no  No notable events documented.  Last Vitals:  Vitals:   02/06/22 2015 02/06/22 2030  BP: (!) 143/94 (!) 158/87  Pulse: 76 81  Resp: 16 (!) 21  Temp:    SpO2: 91% (!) 89%    Last Pain:  Vitals:   02/06/22 1930  TempSrc:   PainSc: Asleep                 Farron Watrous S

## 2022-02-06 NOTE — Interval H&P Note (Signed)
The patient has been re-examined, and the chart reviewed, and there have been no interval changes to the documented history and physical.    Plan for L THA for left pathologic femoral neck fx. Oncology following. Will send head for pathology.  The operative side was examined and the patient was confirmed to have sensation to DPN, SPN, TN intact, Motor EHL, ext, flex 5/5, and DP 2+, PT 2+, No significant edema.   The risks, benefits, and alternatives have been discussed at length with patient, and the patient is willing to proceed.  Left hip marked. Consent has been signed.

## 2022-02-06 NOTE — Care Management Important Message (Signed)
Important Message  Patient Details  Name: Joseph Hogan MRN: [id number] Date of Birth: 05-Aug-1960   Medicare Important Message Given:        Hannah Beat 02/06/2022, 11:49 AM

## 2022-02-06 NOTE — Op Note (Signed)
02/03/2022 - 02/06/2022  7:07 PM  PATIENT:  Joseph Hogan   MRN: [id number]  PRE-OPERATIVE DIAGNOSIS: Left pathologic femoral neck fracture  POST-OPERATIVE DIAGNOSIS:  same  PROCEDURE:  Procedure(s): Left total hip arthroplasty posterior approach  PREOPERATIVE INDICATIONS:    Joseph Hogan is an 61 y.o. male who has a diagnosis of pathologic left femoral neck fracture.  He has had prodromal pain for about 6 weeks.  Preop workup demonstrated lymphadenopathy and lytic lesion in the femoral neck at the site of the fracture.  Per oncology, no additional workup are needed prior to proceeding with surgery..  The risks benefits and alternatives were discussed with the patient including but not limited to the risks of nonoperative treatment, versus surgical intervention including infection, bleeding, nerve injury, periprosthetic fracture, the need for revision surgery, dislocation, leg length discrepancy, blood clots, cardiopulmonary complications, morbidity, mortality, among others, and they were willing to proceed.     OPERATIVE REPORT     SURGEON:  Charlies Constable, MD    ASSISTANT: Izola Price, RNFA, (Present throughout the entire procedure,  necessary for completion of procedure in a timely manner, assisting with retraction, instrumentation, and closure)     ANESTHESIA: General  ESTIMATED BLOOD LOSS: 619JK    COMPLICATIONS:  None.     UNIQUE ASPECTS OF THE CASE: Left femoral neck fracture and femoral head sent for pathology given pathologic nature of the fracture.  No gross abnormalities noted intraoperatively.  COMPONENTS:   Stryker Trident 256 mm acetabular shell, 6.5 hex screws x 2, Trident X.3 neutral liner, Accolade 2 with 127 degree neck angle size #6, 36+0 ceramic head Implant Name Type Inv. Item Serial No. Manufacturer Lot No. LRB No. Used Action  SHELL TRIDENT II CLUST SZ 56MM - [id number] Shell SHELL TRIDENT II CLUST SZ 56MM  [employer] [id number] Left 1 Implanted   SCREW HEX LP 6.5X20 - [id number] Screw SCREW HEX LP 6.5X20  [employer] [id number] Left 1 Implanted  INSERT 0 DEG POLY  36 F - [id number] Miscellaneous INSERT 0 DEG POLY  36 F  [employer] [id number] Left 1 Implanted  SCREW HEX LP 6.5X30 - [id number] Screw SCREW HEX LP 6.5X30  [employer] [id number] Left 1 Implanted  STEM ACCOLADE SZ 6 - [id number] Hips STEM ACCOLADE SZ 6  [employer] [id number] Left 1 Implanted  HEAD CERAMIC FEMORAL 36MM - [id number] Head HEAD CERAMIC FEMORAL 36MM  [employer] [id number] Left 1 Implanted      PROCEDURE IN DETAIL:   The patient was met in the holding area and  identified.  The appropriate hip was identified and marked at the operative site.  The patient was then transported to the OR  and  placed under anesthesia.  At that point, the patient was  placed in the lateral decubitus position with the operative side up and  secured to the operating room table  and all bony prominences padded. A subaxillary role was also placed.    The operative lower extremity was prepped from the iliac crest to the distal leg.  Sterile draping was performed.  Preoperative antibiotics, 2 gm of ancef,1 gm of Tranexamic Acid administered. Time out was performed prior to incision.      A routine posterolateral approach was utilized via sharp dissection  carried down to the subcutaneous tissue.  Gross bleeders were Bovie coagulated.  The iliotibial band was identified and incised along the length of the skin incision through the glute max fascia.  Charnley retractor was placed with  care to protect the sciatic nerve posteriorly.  With the hip internally rotated, the piriformis tendon was identified and released from the femoral insertion and tagged with a #5 Ethibond.  A capsulotomy was then performed off the femoral insertion and also tagged with a #5 Ethibond.    The femoral neck was exposed, and I resected the femoral neck distal to the femoral neck fracture. Femoral  head was removed with a corkscrew. Femoral neck and femoral head were collected and sent to pathology.    I then exposed the deep acetabulum, cleared out any tissue including the ligamentum teres.  After adequate visualization, I excised the labrum.  I then started reaming with a 48 mm reamer, first medializing to the floor of the cotyloid fossa, and then in the position of the cup aiming towards the greater sciatic notch, matching the version of the transverse acetabular ligament and tucked under the anterior wall. I reamed up to 56 mm reamer with good bony bed preparation and a 56 mm cup was chosen.  The real cup was then impacted into place.  Appropriate version and inclination was confirmed clinically matching their bony anatomy, and also with the use of the jig.  I placed 2 screws in the posterior superior quadrant to augment fixation.  A neutral liner was placed and impacted. It was confirmed to be appropriately seated and the acetabular retractors were removed.    I then prepared the proximal femur using the box cutter, Charnley awl, and then sequentially broached starting with 0 up to a size 6.  A trial broach, neck, and head was utilized, and I reduced the hip and it was found to have excellent stability.  There was no impingement with full extension and 90 degrees external rotation.  The hip was stable at the position of sleep and with 90 degrees flexion and 80 degrees of internal rotation.  Leg lengths were also clinically assessed in the lateral position and felt to be equal. Intra-Op flatplate was obtained and confirmed appropriate component positions.  Good fill of the femur with the size 6 broach.  And restoration of leg length and offset. No evidence or concern for fracture.  A final femoral prosthesis size 6 was selected. I then impacted the real femoral prosthesis into place.I again trialed and selected a 36+ 14m ball. The hip was then reduced and taken through a range of motion. There  was no impingement with full extension and 90 degrees external rotation.  The hip was stable at the position of sleep and with 90 degrees flexion and 80 degrees of internal rotation. Leg lengths were  again assessed and felt to be restored.  We then opened, and I impacted the real head ball into place.  The posterior capsule was then closed with #5 Ethibond.  The piriformis was repaired through the base of the abductor tendon using a [place] suture passer.  I then irrigated the hip copiously with normal saline pulse lavage. Periarticular injection was then performed with Exparel.   We repaired the fascia #1 barbed suture, followed by 0 barbed suture for the subcutaneous fat.  Skin was closed with 2-0 Vicryl and 3-0 Monocryl.  Dermabond and Aquacel dressing were applied. The patient was then awakened and returned to PACU in stable and satisfactory condition.  Leg lengths in the supine position were assessed and felt to be clinically equal. There were no complications.  Post op recs: WB: WBAT LLE, No formal hip precautions Abx: ancef Imaging: PACU pelvis Xray  Dressing: Aquacell, keep intact until follow up DVT prophylaxis: Aspirin 81BID starting POD1 Follow up: 2 weeks after surgery for a wound check with Dr. Zachery Dakins at [facility].  Address: [address], [place], [place] [address]  Office Phone: [phone]   Charlies Constable, MD Orthopedic Surgeon

## 2022-02-07 DIAGNOSIS — S72002A Fracture of unspecified part of neck of left femur, initial encounter for closed fracture: Secondary | ICD-10-CM | POA: Diagnosis not present

## 2022-02-07 LAB — CBC
HCT: 35 % — ABNORMAL LOW (ref 39.0–52.0)
Hemoglobin: 11.5 g/dL — ABNORMAL LOW (ref 13.0–17.0)
MCH: 28.4 pg (ref 26.0–34.0)
MCHC: 32.9 g/dL (ref 30.0–36.0)
MCV: 86.4 fL (ref 80.0–100.0)
Platelets: 427 10*3/uL — ABNORMAL HIGH (ref 150–400)
RBC: 4.05 MIL/uL — ABNORMAL LOW (ref 4.22–5.81)
RDW: 12.4 % (ref 11.5–15.5)
WBC: 12.3 10*3/uL — ABNORMAL HIGH (ref 4.0–10.5)
nRBC: 0 % (ref 0.0–0.2)

## 2022-02-07 LAB — BASIC METABOLIC PANEL
Anion gap: 10 (ref 5–15)
BUN: 31 mg/dL — ABNORMAL HIGH (ref 8–23)
CO2: 27 mmol/L (ref 22–32)
Calcium: 8.6 mg/dL — ABNORMAL LOW (ref 8.9–10.3)
Chloride: 101 mmol/L (ref 98–111)
Creatinine, Ser: 0.98 mg/dL (ref 0.61–1.24)
GFR, Estimated: >60 mL/min (ref >60)
Glucose, Bld: 209 mg/dL — ABNORMAL HIGH (ref 70–99)
Potassium: 5.1 mmol/L (ref 3.5–5.1)
Sodium: 138 mmol/L (ref 135–145)

## 2022-02-07 MED ORDER — METHOCARBAMOL 500 MG PO TABS
500.0000 mg | ORAL_TABLET | Freq: Four times a day (QID) | ORAL | Status: DC | PRN
  Administered 2022-02-07 – 2022-02-12 (×9): 500 mg via ORAL
  Filled 2022-02-07 (×9): qty 1

## 2022-02-07 MED ORDER — KETOROLAC TROMETHAMINE 15 MG/ML IJ SOLN
15.0000 mg | Freq: Three times a day (TID) | INTRAMUSCULAR | Status: AC
  Administered 2022-02-07 – 2022-02-09 (×9): 15 mg via INTRAVENOUS
  Filled 2022-02-07 (×9): qty 1

## 2022-02-07 MED ORDER — TAMSULOSIN HCL 0.4 MG PO CAPS
0.4000 mg | ORAL_CAPSULE | Freq: Every day | ORAL | Status: DC
  Administered 2022-02-07 – 2022-02-12 (×6): 0.4 mg via ORAL
  Filled 2022-02-07 (×6): qty 1

## 2022-02-07 MED ORDER — OXYCODONE HCL 5 MG PO TABS
5.0000 mg | ORAL_TABLET | ORAL | Status: DC | PRN
  Administered 2022-02-07 (×2): 15 mg via ORAL
  Administered 2022-02-08 – 2022-02-12 (×17): 10 mg via ORAL
  Filled 2022-02-07 (×3): qty 2
  Filled 2022-02-07: qty 3
  Filled 2022-02-07 (×2): qty 2
  Filled 2022-02-07: qty 3
  Filled 2022-02-07 (×3): qty 2
  Filled 2022-02-07: qty 3
  Filled 2022-02-07 (×8): qty 2

## 2022-02-07 NOTE — Progress Notes (Signed)
Physical Therapy Treatment Patient Details Name: Joseph Hogan MRN: [id number] DOB: 09-16-1960 Today's Date: 02/07/2022   History of Present Illness Pt s/p fall 02/03/22 with L hip pathologic fx and now s/p THR by posterior approach on 02/06/22.    PT Comments    Pt continues apprehensive but very cooperative.  Therex program initiated and pt up to EOB sitting, standing and step pvt to recliner.  Pt limited by c/o dizziness with position change - BP 135/77 in sitting.   Recommendations for follow up therapy are one component of a multi-disciplinary discharge planning process, led by the attending physician.  Recommendations may be updated based on patient status, additional functional criteria and insurance authorization.  Follow Up Recommendations  Home health PT     Assistance Recommended at Discharge Frequent or constant Supervision/Assistance  Patient can return home with the following A lot of help with walking and/or transfers;A little help with bathing/dressing/bathroom;Assistance with cooking/housework;Assist for transportation;Help with stairs or ramp for entrance   Equipment Recommendations  Rolling walker (2 wheels)    Recommendations for Other Services OT consult     Precautions / Restrictions Precautions Precautions: Fall;Other (comment) Precaution Comments: No formal THP Restrictions Weight Bearing Restrictions: No LLE Weight Bearing: Weight bearing as tolerated     Mobility  Bed Mobility Overal bed mobility: Needs Assistance Bed Mobility: Supine to Sit     Supine to sit: Min assist, Mod assist, HOB elevated     General bed mobility comments: increased time with use of bed rail and cues for sequence and use of R LE to self assist    Transfers Overall transfer level: Needs assistance Equipment used: Rolling walker (2 wheels) Transfers: Sit to/from Stand, Bed to chair/wheelchair/BSC Sit to Stand: Min assist, Mod assist, +2 safety/equipment, From  elevated surface   Step pivot transfers: Min assist, Mod assist, +2 physical assistance, +2 safety/equipment, From elevated surface       General transfer comment: cues for LE management and use of UEs to self assist; step pvt bed to chair    Ambulation/Gait               General Gait Details: step pvt bed to chair only   Stairs             Wheelchair Mobility    Modified Rankin (Stroke Patients Only)       Balance Overall balance assessment: Needs assistance Sitting-balance support: No upper extremity supported, Feet supported Sitting balance-Leahy Scale: Fair     Standing balance support: Bilateral upper extremity supported Standing balance-Leahy Scale: Poor                              Cognition Arousal/Alertness: Awake/alert Behavior During Therapy: WFL for tasks assessed/performed Overall Cognitive Status: Within Functional Limits for tasks assessed                                          Exercises Total Joint Exercises Ankle Circles/Pumps: AROM, Both, 15 reps, Supine Quad Sets: AROM, Both, 10 reps, Supine Heel Slides: AAROM, Left, 15 reps, Supine Hip ABduction/ADduction: AAROM, Left, 15 reps, Supine    General Comments        Pertinent Vitals/Pain Pain Assessment Pain Assessment: 0-10 Pain Score: 4  Pain Location: L hip and groin Pain Descriptors / Indicators: Aching, Grimacing, Guarding, Sore  Pain Intervention(s): Limited activity within patient's tolerance, Monitored during session, Premedicated before session, Ice applied    Home Living Family/patient expects to be discharged to:: Private residence Living Arrangements: Alone Available Help at Discharge: Friend(s);Family;Available 24 hours/day Type of Home: House Home Access: Stairs to enter Entrance Stairs-Rails: None Entrance Stairs-Number of Steps: 2+1 (pt may have ramp in place at time of dc)   Home Layout: One level Home Equipment: Standard  Walker;Rollator (4 wheels)      Prior Function            PT Goals (current goals can now be found in the care plan section) Acute Rehab PT Goals Patient Stated Goal: Regain IND PT Goal Formulation: With patient Time For Goal Achievement: 02/21/22 Potential to Achieve Goals: Good Progress towards PT goals: Progressing toward goals    Frequency    7X/week      PT Plan Current plan remains appropriate    Co-evaluation              AM-PAC PT "6 Clicks" Mobility   Outcome Measure  Help needed turning from your back to your side while in a flat bed without using bedrails?: A Lot Help needed moving from lying on your back to sitting on the side of a flat bed without using bedrails?: A Lot Help needed moving to and from a bed to a chair (including a wheelchair)?: A Lot Help needed standing up from a chair using your arms (e.g., wheelchair or bedside chair)?: A Lot Help needed to walk in hospital room?: Total Help needed climbing 3-5 steps with a railing? : Total 6 Click Score: 10    End of Session Equipment Utilized During Treatment: Gait belt Activity Tolerance: Patient tolerated treatment well;Patient limited by fatigue;Other (comment) (c/o dizziness - BP WNL) Patient left: in bed;with call bell/phone within reach Nurse Communication: Mobility status PT Visit Diagnosis: Difficulty in walking, not elsewhere classified (R26.2);History of falling (Z91.81)     Time: 0071-2197 PT Time Calculation (min) (ACUTE ONLY): 32 min  Charges:  $Therapeutic Exercise: 8-22 mins $Therapeutic Activity: 8-22 mins                     Debe Coder PT Acute Rehabilitation Services Pager [phone] Office [phone]    Fumiye Lubben 02/07/2022, 12:56 PM

## 2022-02-07 NOTE — Progress Notes (Signed)
     Subjective:  Patient reports pain as mild.  Better controlled pain than preop. Discussed intra-op findings. Plan for PT today. Denies distal n/t.  Objective:   VITALS:   Vitals:   02/06/22 2100 02/06/22 2113 02/06/22 2320 02/07/22 0600  BP: (!) 178/94 (!) 169/96 (!) 143/85 139/63  Pulse: 82 81 80 78  Resp: '19 18 17 18  '$ Temp:  98 F (36.7 C) 98 F (36.7 C) (!) 97.2 F (36.2 C)  TempSrc:  Oral Oral Oral  SpO2: 97% 100% 96% 97%  Weight:      Height:        Neurovascular intact Sensation intact distally Intact pulses distally Dorsiflexion/Plantar flexion intact Incision: dressing C/D/I Compartment soft   Lab Results  Component Value Date   WBC 11.2 (H) 02/06/2022   HGB 12.9 (L) 02/06/2022   HCT 38.8 (L) 02/06/2022   MCV 85.1 02/06/2022   PLT 372 02/06/2022   BMET    Component Value Date/Time   NA 135 02/06/2022 0311   K 4.2 02/06/2022 0311   CL 100 02/06/2022 0311   CO2 25 02/06/2022 0311   GLUCOSE 131 (H) 02/06/2022 0311   BUN 22 02/06/2022 0311   CREATININE 1.07 02/06/2022 0311   CREATININE 0.77 06/04/2016 1259   CALCIUM 8.8 (L) 02/06/2022 0311   GFRNONAA >60 02/06/2022 0311   GFRNONAA >89 04/08/2015 0837      Xray: post op xrays show THA components in good position, no adverse features  Assessment/Plan: 1 Day Post-Op   Principal Problem:   Hip fracture (HCC) Active Problems:   Lumbar radiculopathy   Leukocytosis   Lytic bone lesion of femur  S/p L THA for FN fx Left femoral head and neck sent for pathology  Post op recs: WB: WBAT LLE, No formal hip precautions Abx: ancef Imaging: PACU pelvis Xray Dressing: Aquacell, keep intact until follow up DVT prophylaxis: Aspirin 81BID starting POD1 Follow up: 2 weeks after surgery for a wound check with Dr. Z[facility] Dakins at [facility].  Address: [address] [place], [place], [place] [address]  Office Phone: [phone]   [name] 02/07/2022, 7:02 AM   Charlies Constable, MD  Contact information:   [phone] 7am-5pm epic message Dr. Zachery Dakins, or call office for patient follow up: [phone] After hours and holidays please check [email or web address] for group call information for [facility]

## 2022-02-07 NOTE — Evaluation (Signed)
Physical Therapy Evaluation Patient Details Name: Joseph Hogan MRN: [id number] DOB: Sep 21, 1960 Today's Date: 02/07/2022  History of Present Illness  Pt s/p fall 02/03/22 with L hip pathologic fx and now s/p THR by posterior approach on 02/06/22.  Clinical Impression  Pt admitted as above and presenting with functional mobility limitations 2* decreased L LE strength/ROM and post op pain.  This am, attempted to mobilize pt but with increased pain level and pt requesting additional pain meds - RN aware. Will complete eval later this am.     Recommendations for follow up therapy are one component of a multi-disciplinary discharge planning process, led by the attending physician.  Recommendations may be updated based on patient status, additional functional criteria and insurance authorization.  Follow Up Recommendations Home health PT      Assistance Recommended at Discharge Frequent or constant Supervision/Assistance  Patient can return home with the following  A lot of help with walking and/or transfers;A little help with bathing/dressing/bathroom;Assistance with cooking/housework;Assist for transportation;Help with stairs or ramp for entrance    Equipment Recommendations Rolling walker (2 wheels)  Recommendations for Other Services  OT consult    Functional Status Assessment Patient has had a recent decline in their functional status and demonstrates the ability to make significant improvements in function in a reasonable and predictable amount of time.     Precautions / Restrictions Precautions Precautions: Fall;Other (comment) Precaution Comments: No formal THP Restrictions Weight Bearing Restrictions: No LLE Weight Bearing: Weight bearing as tolerated      Mobility  Bed Mobility               General bed mobility comments: Pain increased with attempts to mobilize and pt requesting additional pain meds    Transfers                         Ambulation/Gait                  Stairs            Wheelchair Mobility    Modified Rankin (Stroke Patients Only)       Balance                                             Pertinent Vitals/Pain Pain Assessment Pain Assessment: 0-10 Pain Score: 7  Pain Location: L hip and groin Pain Descriptors / Indicators: Aching, Grimacing, Guarding, Sore Pain Intervention(s): Limited activity within patient's tolerance, Monitored during session, Patient requesting pain meds-RN notified, Ice applied    Home Living Family/patient expects to be discharged to:: Private residence Living Arrangements: Alone Available Help at Discharge: Friend(s);Family;Available 24 hours/day Type of Home: House Home Access: Stairs to enter Entrance Stairs-Rails: None Entrance Stairs-Number of Steps: 2+1 (pt may have ramp in place at time of dc)   Home Layout: One level Home Equipment: Standard Walker;Rollator (4 wheels)      Prior Function Prior Level of Function : Independent/Modified Independent             Mobility Comments: had been using rollator 2* hip pain prior to fall       Hand Dominance        Extremity/Trunk Assessment   Upper Extremity Assessment Upper Extremity Assessment: Overall WFL for tasks assessed    Lower Extremity Assessment Lower Extremity Assessment: LLE deficits/detail  Cervical / Trunk Assessment Cervical / Trunk Assessment: Normal  Communication   Communication: No difficulties  Cognition Arousal/Alertness: Awake/alert Behavior During Therapy: WFL for tasks assessed/performed Overall Cognitive Status: Within Functional Limits for tasks assessed                                          General Comments      Exercises Total Joint Exercises Ankle Circles/Pumps: AROM, Both, 15 reps, Supine   Assessment/Plan    PT Assessment Patient needs continued PT services  PT Problem List Decreased  strength;Decreased range of motion;Decreased activity tolerance;Decreased balance;Decreased mobility;Decreased knowledge of use of DME;Pain;Obesity       PT Treatment Interventions DME instruction;Gait training;Stair training;Functional mobility training;Therapeutic activities;Therapeutic exercise;Balance training;Patient/family education    PT Goals (Current goals can be found in the Care Plan section)  Acute Rehab PT Goals Patient Stated Goal: Regain IND PT Goal Formulation: With patient Time For Goal Achievement: 02/21/22 Potential to Achieve Goals: Good    Frequency 7X/week     Co-evaluation               AM-PAC PT "6 Clicks" Mobility  Outcome Measure Help needed turning from your back to your side while in a flat bed without using bedrails?: A Lot Help needed moving from lying on your back to sitting on the side of a flat bed without using bedrails?: A Lot Help needed moving to and from a bed to a chair (including a wheelchair)?: A Lot Help needed standing up from a chair using your arms (e.g., wheelchair or bedside chair)?: A Lot Help needed to walk in hospital room?: Total Help needed climbing 3-5 steps with a railing? : Total 6 Click Score: 10    End of Session   Activity Tolerance: Patient limited by pain Patient left: in bed;with call bell/phone within reach Nurse Communication: Mobility status PT Visit Diagnosis: Difficulty in walking, not elsewhere classified (R26.2);History of falling (Z91.81)    Time: 7510-2585 PT Time Calculation (min) (ACUTE ONLY): 13 min   Charges:   PT Evaluation $PT Eval Low Complexity: 1 Low          [place] Pager [phone] Office [phone]   Ambri Miltner 02/07/2022, 12:48 PM

## 2022-02-07 NOTE — Progress Notes (Signed)
PROGRESS NOTE    Joseph Hogan  QIW:[id number] DOB: 1960/11/10 DOA: 02/03/2022 PCP: Leonie Douglas, MD     Brief Narrative:  Joseph Hogan is a 61 year old male with medical history significant of arthritis, lumbar radiculopathy, hypertension, prediabetes, not taking any medications, but instead taking natural remedies.  Patient was admitted with left hip pain following a mechanical fall. Apparently, patient was ambulating with a walker secondary to lumbar radicular pain.  Patient was trying to go up into his home with a walker when he had a mechanical slip and fall.  Patient's pain was very difficult to control. In the ED. patient required multiple doses of Dilaudid, a dose of fentanyl, dose of ketamine, dose of Toradol.  Blood pressure was also significantly elevated.  CT scan of the left hip done revealed acute pathological fracture of the left femoral neck with displacement and angulation, with underlying lytic lesions in the femoral head and neck.  Patient is also being worked up for possible metastasis to the bones. Oncology was consulted for work up. Orthopedic surgery consulted for hip fracture. Patient transferred to [facility] for surgical repair, underwent left hip arthroplasty 12/27.   New events last 24 hours / Subjective: Continues to have pain in his left hip.  Much better after surgery.  Eager for PT OT.  Assessment & Plan:  Principal Problem:   Hip fracture ([place]) Active Problems:   Lumbar radiculopathy   Leukocytosis   Lytic bone lesion of femur   Left hip fracture post mechanical fall -Status post left total hip arthroplasty by Dr. Zachery Dakins  -Pain control -PT OT, weight-bear as tolerated -Aspirin 81 mg twice daily for DVT prophylaxis -Follow-up in 2 weeks outpatient  Lytic bone lesions -Appreciate Dr. [name], hematology/oncology -Follow-up outpatient for completion of workup  Lumbar radiculopathy -Gabapentin   DVT prophylaxis:  SCDs Start: 02/03/22 2043  Code  Status: Full code Family Communication: No family at bedside Disposition Plan:  Status is: Inpatient Remains inpatient appropriate because: PT OT post-op   Antimicrobials:  Anti-infectives (From admission, onward)    Start     Dose/Rate Route Frequency Ordered Stop   02/07/22 0100  ceFAZolin (ANCEF) IVPB 2g/100 mL premix        2 g 200 mL/hr over 30 Minutes Intravenous Every 8 hours 02/06/22 2130 02/07/22 0854   02/06/22 0600  ceFAZolin (ANCEF) IVPB 2g/100 mL premix        2 g 200 mL/hr over 30 Minutes Intravenous On call to O.R. 02/05/22 1347 02/06/22 1746        Objective: Vitals:   02/06/22 2320 02/07/22 0600 02/07/22 0807 02/07/22 1324  BP: (!) 143/85 139/63 (!) 152/95 (!) 151/89  Pulse: 80 78 77 85  Resp: '17 18 20 18  '$ Temp: 98 F (36.7 C) (!) 97.2 F (36.2 C) 99.3 F (37.4 C) 98.2 F (36.8 C)  TempSrc: Oral Oral Oral   SpO2: 96% 97% 92% 96%  Weight:      Height:        Intake/Output Summary (Last 24 hours) at 02/07/2022 1453 Last data filed at 02/07/2022 0900 Gross per 24 hour  Intake 3500 ml  Output 1350 ml  Net 2150 ml   Filed Weights   02/03/22 1531 02/06/22 1333  Weight: 108.9 kg 108 kg    Examination:  General exam: Appears calm and comfortable  Respiratory system: Clear to auscultation. Respiratory effort normal. No respiratory distress. No conversational dyspnea.  Cardiovascular system: S1 & S2 heard, RRR. No murmurs. No  pedal edema. Gastrointestinal system: Abdomen is nondistended, soft and nontender. Normal bowel sounds heard. Central nervous system: Alert and oriented. No focal neurological deficits. Speech clear.  Extremities: Symmetric in appearance  Skin: No rashes, lesions or ulcers on exposed skin  Psychiatry: Judgement and insight appear normal. Mood & affect appropriate.   Data Reviewed: I have personally reviewed following labs and imaging studies  CBC: Recent Labs  Lab 02/03/22 1821 02/04/22 0316 02/06/22 0311 02/07/22 0853   WBC 13.0* 8.3 11.2* 12.3*  NEUTROABS 11.6* 6.7 8.7*  --   HGB 12.8* 11.9* 12.9* 11.5*  HCT 37.7* 35.0* 38.8* 35.0*  MCV 84.0 83.1 85.1 86.4  PLT 418* 360 372 937*   Basic Metabolic Panel: Recent Labs  Lab 02/03/22 1821 02/04/22 0316 02/06/22 0311 02/07/22 0853  NA 138 134* 135 138  K 4.0 4.1 4.2 5.1  CL 103 102 100 101  CO2 '23 24 25 27  '$ GLUCOSE 178* 145* 131* 209*  BUN '16 17 22 '$ 31*  CREATININE 0.82 0.79 1.07 0.98  CALCIUM 9.0 8.8* 8.8* 8.6*  MG  --  2.0 1.9  --   PHOS  --   --  5.3*  --    GFR: Estimated Creatinine Clearance: 96.5 mL/min (by C-G formula based on SCr of 0.98 mg/dL). Liver Function Tests: Recent Labs  Lab 02/04/22 0316 02/06/22 0311  AST 24  --   ALT 18  --   ALKPHOS 100  --   BILITOT 0.3  --   PROT 6.8  --   ALBUMIN 3.2* 2.9*   No results for input(s): "LIPASE", "AMYLASE" in the last 168 hours. No results for input(s): "AMMONIA" in the last 168 hours. Coagulation Profile: Recent Labs  Lab 02/03/22 1821  INR 1.2   Cardiac Enzymes: No results for input(s): "CKTOTAL", "CKMB", "CKMBINDEX", "TROPONINI" in the last 168 hours. BNP (last 3 results) No results for input(s): "PROBNP" in the last 8760 hours. HbA1C: No results for input(s): "HGBA1C" in the last 72 hours. CBG: No results for input(s): "GLUCAP" in the last 168 hours. Lipid Profile: No results for input(s): "CHOL", "HDL", "LDLCALC", "TRIG", "CHOLHDL", "LDLDIRECT" in the last 72 hours. Thyroid Function Tests: No results for input(s): "TSH", "T4TOTAL", "FREET4", "T3FREE", "THYROIDAB" in the last 72 hours. Anemia Panel: No results for input(s): "VITAMINB12", "FOLATE", "FERRITIN", "TIBC", "IRON", "RETICCTPCT" in the last 72 hours. Sepsis Labs: No results for input(s): "PROCALCITON", "LATICACIDVEN" in the last 168 hours.  Recent Results (from the past 240 hour(s))  MRSA Next Gen by PCR, Nasal     Status: None   Collection Time: 02/05/22 10:42 PM   Specimen: Nasal Mucosa; Nasal Swab   Result Value Ref Range Status   MRSA by PCR Next Gen NOT DETECTED NOT DETECTED Final    Comment: (NOTE) The GeneXpert MRSA Assay (FDA approved for NASAL specimens only), is one component of a comprehensive MRSA colonization surveillance program. It is not intended to diagnose MRSA infection nor to guide or monitor treatment for MRSA infections. Test performance is not FDA approved in patients less than 26 years old. Performed at [facility], [place] [address] [place], [place] [address]       Radiology Studies: DG HIP UNILAT W OR W/O PELVIS 2-3 VIEWS LEFT  Result Date: 02/06/2022 CLINICAL DATA:  Status post total left hip arthroplasty EXAM: DG HIP (WITH OR WITHOUT PELVIS) 2-3V LEFT COMPARISON:  02/03/2022 FINDINGS: Postoperative change of left THA. No radiographic evidence of loosening. No acute abnormality. IMPRESSION: Expected postoperative  change of left THA. Electronically Signed   By: Placido Sou M.D.   On: 02/06/2022 20:14   DG Pelvis Portable  Result Date: 02/06/2022 CLINICAL DATA:  Left hip arthroplasty EXAM: PORTABLE PELVIS 1-2 VIEWS COMPARISON:  02/05/2022 FINDINGS: There is interval left hip arthroplasty. No fracture is seen. In the previous study, comminuted fracture was seen in the neck of left femur. Cortical margins of left pubic bone appear less distinct. A lytic lesion was seen in the same region in the previous CT. IMPRESSION: Status post left hip arthroplasty. Electronically Signed   By: Elmer Picker M.D.   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 * Tracking Code: BO * EXAM: CT CHEST, ABDOMEN, AND PELVIS WITH CONTRAST TECHNIQUE: Multidetector CT imaging of the chest, abdomen and pelvis was performed following the standard protocol during bolus administration of intravenous contrast.  RADIATION DOSE REDUCTION: This exam was performed according to the departmental dose-optimization program which includes automated exposure control, adjustment of the mA and/or kV according to patient size and/or use of iterative reconstruction technique. CONTRAST:  40m OMNIPAQUE IOHEXOL 350 MG/ML SOLN COMPARISON:  CT left hip 02/04/2022 FINDINGS: CT CHEST FINDINGS Cardiovascular: Unremarkable Mediastinum/Nodes: Left supraclavicular node 1.2 cm in short axis, image 3 series 4. Small type 1 hiatal hernia. Lower thoracic periaortic lymph node 0.8 cm in short axis on image 51 series 4. Lungs/Pleura: Bandlike atelectasis especially dependently in both lower lobes and to a lesser degree in the lingula and posteriorly in the right upper lobe. No well-defined pulmonary nodules. Musculoskeletal: Pathologic fracture anteriorly in the fourth rib, image 52 series 6. Deformity anteriorly in the right second rib probably from early pathologic fracture, image 33 series 6. Marrow heterogeneity in the third rib anteriorly on image 44 series 6 without visible fracture. Thoracic spondylosis. Small lucency inferiorly in the T7 vertebral body, image 80 series 8, probably a Schmorl's node. CT ABDOMEN PELVIS FINDINGS Hepatobiliary: No well-defined focal hepatic lesion identified. Gallbladder unremarkable. No biliary dilatation. Pancreas: Unremarkable Spleen: Unremarkable Adrenals/Urinary Tract: No adrenal mass identified. Hypodense right renal lesions favor cysts and measure below 20 Hounsfield units. No further imaging workup of these lesions is indicated. Urinary bladder unremarkable. 1.1 cm exophytic lesion from the left mid kidney posterolaterally internal density 34 Hounsfield units on image 89 series 7, nonspecific for complex cyst versus tumor. Stomach/Bowel: Wall thickening in the proximal stomach body is likely secondary to nondistention. Wall thickening in the rectum as on image 114 series 4 nonspecific and could be  inflammatory, with tumor is a less likely differential diagnostic consideration given the fairly long segment of involvement measuring about 10 cm in length. Sigmoid colon diverticulosis. Normal appendix. Redundant transverse colon. Vascular/Lymphatic: Pathologic retroperitoneal adenopathy observed, index periaortic lymph node 1.9 cm in short axis on image 70 series 4. Right common iliac node 1.4 cm in short axis, image 89 series 4. Right external iliac adenopathy noted with index lymph node 1.6 cm in short axis, image 107 series 4. Left external iliac lymph node 1.3 cm in short axis, image 105 series 4. Reproductive: Unremarkable Other: Substantial presacral edema noted, with some trace fluid in lower paracolic gutters bilaterally. Perirectal stranding noted with multiple small perirectal lymph nodes including a 0.8 cm node on image 102 series 4. Musculoskeletal: As noted on dedicated exams, lytic destructive lesions of the left  superior and inferior pubic ramus and pubic body are noted along with a pathologic fracture through the left femoral neck 1, and adjacent edema along fascia planes. Left hip joint effusion. Thickening/edema along the left obturator internus muscle. Edema tracks along the left hip adductor musculature. Lumbar spondylosis and degenerative disc disease causing generally mild multilevel impingement. IMPRESSION: 1. Pathologic retroperitoneal and bilateral pelvic adenopathy, with lytic destructive lesions of the left superior and inferior pubic ramus and pubic body as well as a pathologic fracture through the left femoral neck. Pathologic fracture anteriorly in the right second rib. Deformity anteriorly in the right third rib probably from early pathologic fracture. Metastatic disease or myeloma strongly favored over multifocal osteomyelitis as a cause. 2. Mildly enlarged left supraclavicular lymph node and lower thoracic periaortic lymph node. Pathologic retroperitoneal and pelvic adenopathy. 3.  10 cm long segment of rectal wall thickening, possibly inflammatory although rectal tumor is a differential diagnostic consideration. 4. 1.1 cm exophytic lesion from the left mid kidney posterolaterally, nonspecific for complex cyst versus tumor. This could be further assessed with dedicated renal protocol MRI with and without contrast if clinically warranted, although such a small lesion is highly unlikely to be the cause of the adenopathy and bony lesions. 5. Bandlike atelectasis in both lungs. 6. Small type 1 hiatal hernia. 7. Lumbar spondylosis and degenerative disc disease causing generally mild multilevel impingement. 8. Sigmoid colon diverticulosis. 9. Wall thickening in the proximal stomach body, probably secondary to nondistention. Electronically Signed   By: Van Clines M.D.   On: 02/05/2022 18:36   CT FEMUR LEFT WO CONTRAST  Result Date: 02/05/2022 CLINICAL DATA:  Metastatic disease to the skeleton and left hip EXAM: CT OF THE LOWER LEFT EXTREMITY WITHOUT CONTRAST TECHNIQUE: Multidetector CT imaging of the lower left extremity was performed according to the standard protocol. RADIATION DOSE REDUCTION: This exam was performed according to the departmental dose-optimization program which includes automated exposure control, adjustment of the mA and/or kV according to patient size and/or use of iterative reconstruction technique. COMPARISON:  Left hip CT 02/04/2022 FINDINGS: Bones/Joint/Cartilage Lytic metastatic involvement in the left superior pubic ramus, left pubic body, and extending into the anterior aspect of the right inferior pubic ramus. Cortical destruction along the medial margin of the left pubis adjacent to the pubic symphysis on image 50 of series 3, along with a pathologic fracture of the superior pubic ramus in the vicinity of the lesion Intra-articular left femoral neck fracture associated with some indistinct lucency in the femoral neck compatible with a pathologic fracture.  Varus angulation noted along with about 0.6 cm medial displacement of the femoral head fragment with respect to the distal fragment. Prominent osteoarthritis of the knee. Extensive tricompartmental spurring and loss of articular space. 8 mm free osteochondral fragment posteriorly in the left knee joint. Moderate degenerative chondral thinning in the left hip joint. No obvious lytic destructive findings in the left femur distal to the femoral neck. Ligaments Suboptimally assessed by CT. Muscles and Tendons Mild edema noted along fascia planes in the vicinity of the femoral neck fracture. Otherwise unremarkable. Soft tissues Presacral edema is partially included. Superficial femoral artery atherosclerotic vascular calcification is present. IMPRESSION: 1. 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 There is a pathologic fracture of the left superior pubic ramus adjacent to the pubic symphysis. 2. Pathologic intra-articular fracture of the left femoral neck with about 0.6 cm of medial displacement of  the femoral head fragment with respect to the distal fragment. 3. 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 4. 8 mm free osteochondral fragment posteriorly in the left knee joint. 5. SFA atherosclerotic vascular calcification. 6. Presacral edema is partially included. Electronically Signed   By: Van Clines M.D.   On: 02/05/2022 18:16      Scheduled Meds:  ascorbic acid  250 mg Oral Daily   aspirin EC  81 mg Oral BID   B-complex with vitamin C  1 tablet Oral Daily   cholecalciferol  1,000 Units Oral Daily   gabapentin  300 mg Oral TID   ketorolac  15 mg Intravenous Q8H   multivitamin with minerals  1 tablet Oral Daily   tamsulosin  0.4 mg Oral Daily   Continuous Infusions:   LOS: 4 days   Time spent: 20 minutes   Dessa Phi, DO [facility] 02/07/2022, 2:53 PM   Available via  Epic secure chat 7am-7pm After these hours, please refer to coverage provider listed on [email or web address]

## 2022-02-07 NOTE — Progress Notes (Signed)
Physical Therapy Treatment Patient Details Name: Joseph Hogan MRN: [id number] DOB: Aug 28, 1960 Today's Date: 02/07/2022   History of Present Illness Pt s/p fall 02/03/22 with L hip pathologic fx and now s/p THR by posterior approach on 02/06/22.    PT Comments    Pt continues very agreeable and up from chair with min assist but struggling to comprehend WB on UEs and with noted buckling at knees requiring increased assist to step pvt to sit EOB. Mod assist to return to bed.  Recommendations for follow up therapy are one component of a multi-disciplinary discharge planning process, led by the attending physician.  Recommendations may be updated based on patient status, additional functional criteria and insurance authorization.  Follow Up Recommendations  Home health PT     Assistance Recommended at Discharge Frequent or constant Supervision/Assistance  Patient can return home with the following A lot of help with walking and/or transfers;A little help with bathing/dressing/bathroom;Assistance with cooking/housework;Assist for transportation;Help with stairs or ramp for entrance   Equipment Recommendations  Rolling walker (2 wheels)    Recommendations for Other Services OT consult     Precautions / Restrictions Precautions Precautions: Fall;Other (comment) Precaution Comments: No formal THP Restrictions Weight Bearing Restrictions: No LLE Weight Bearing: Weight bearing as tolerated     Mobility  Bed Mobility Overal bed mobility: Needs Assistance Bed Mobility: Sit to Supine     Supine to sit: Min assist, Mod assist, +2 for physical assistance, +2 for safety/equipment     General bed mobility comments: increased time with cues for sequence and use of R LE and UEs to assist    Transfers Overall transfer level: Needs assistance Equipment used: Rolling walker (2 wheels) Transfers: Sit to/from Stand, Bed to chair/wheelchair/BSC Sit to Stand: Min assist, Mod assist, +2  safety/equipment, From elevated surface   Step pivot transfers: Min assist, Mod assist, +2 physical assistance, +2 safety/equipment, From elevated surface       General transfer comment: cues for LE management and use of UEs to self assist; step pvt chair to bed    Ambulation/Gait               General Gait Details: step pvt chair to bed only with noted buckling at knees and pt having difficulty comprehending WB on UEs   Stairs             Wheelchair Mobility    Modified Rankin (Stroke Patients Only)       Balance Overall balance assessment: Needs assistance Sitting-balance support: No upper extremity supported, Feet supported Sitting balance-Leahy Scale: Fair     Standing balance support: Bilateral upper extremity supported Standing balance-Leahy Scale: Poor                              Cognition Arousal/Alertness: Awake/alert Behavior During Therapy: WFL for tasks assessed/performed Overall Cognitive Status: Within Functional Limits for tasks assessed                                          Exercises Total Joint Exercises Ankle Circles/Pumps: AROM, Both, 15 reps, Supine Quad Sets: AROM, Both, 10 reps, Supine Heel Slides: AAROM, Left, 15 reps, Supine Hip ABduction/ADduction: AAROM, Left, 15 reps, Supine    General Comments        Pertinent Vitals/Pain Pain Assessment Pain Assessment: 0-10 Pain Score:  4  Pain Location: L hip and groin Pain Descriptors / Indicators: Aching, Grimacing, Guarding, Sore Pain Intervention(s): Limited activity within patient's tolerance, Monitored during session, Premedicated before session, Ice applied    Home Living Family/patient expects to be discharged to:: Private residence Living Arrangements: Alone Available Help at Discharge: Friend(s);Family;Available 24 hours/day Type of Home: House Home Access: Stairs to enter Entrance Stairs-Rails: None Entrance Stairs-Number of Steps:  2+1 (pt may have ramp in place at time of dc)   Home Layout: One level Home Equipment: Standard Walker;Rollator (4 wheels)      Prior Function            PT Goals (current goals can now be found in the care plan section) Acute Rehab PT Goals Patient Stated Goal: Regain IND PT Goal Formulation: With patient Time For Goal Achievement: 02/21/22 Potential to Achieve Goals: Good Progress towards PT goals: Progressing toward goals    Frequency    7X/week      PT Plan Current plan remains appropriate    Co-evaluation              AM-PAC PT "6 Clicks" Mobility   Outcome Measure  Help needed turning from your back to your side while in a flat bed without using bedrails?: A Lot Help needed moving from lying on your back to sitting on the side of a flat bed without using bedrails?: A Lot Help needed moving to and from a bed to a chair (including a wheelchair)?: A Lot Help needed standing up from a chair using your arms (e.g., wheelchair or bedside chair)?: A Lot Help needed to walk in hospital room?: Total Help needed climbing 3-5 steps with a railing? : Total 6 Click Score: 10    End of Session Equipment Utilized During Treatment: Gait belt Activity Tolerance: Patient limited by fatigue Patient left: in bed;with call bell/phone within reach Nurse Communication: Mobility status PT Visit Diagnosis: Difficulty in walking, not elsewhere classified (R26.2);History of falling (Z91.81)     Time: 9924-2683 PT Time Calculation (min) (ACUTE ONLY): 19 min  Charges:  $Therapeutic Exercise: 8-22 mins $Therapeutic Activity: 8-22 mins                     Debe Coder PT Acute Rehabilitation Services Pager [phone] Office [phone]    Joseph Hogan 02/07/2022, 3:59 PM

## 2022-02-07 NOTE — Care Management Important Message (Signed)
Important Message  Patient Details IM Letter given. Name: Joseph Hogan MRN: [id number] Date of Birth: 12/02/1960   Medicare Important Message Given:  Yes     Kerin Salen 02/07/2022, 2:54 PM

## 2022-02-08 ENCOUNTER — Encounter (HOSPITAL_COMMUNITY): Payer: Self-pay | Admitting: Orthopedic Surgery

## 2022-02-08 DIAGNOSIS — S72002A Fracture of unspecified part of neck of left femur, initial encounter for closed fracture: Secondary | ICD-10-CM | POA: Diagnosis not present

## 2022-02-08 DIAGNOSIS — M899 Disorder of bone, unspecified: Secondary | ICD-10-CM | POA: Diagnosis not present

## 2022-02-08 LAB — PROTEIN ELECTROPHORESIS, SERUM
A/G Ratio: 0.9 (ref 0.7–1.7)
Albumin ELP: 3 g/dL (ref 2.9–4.4)
Alpha-1-Globulin: 0.4 g/dL (ref 0.0–0.4)
Alpha-2-Globulin: 1 g/dL (ref 0.4–1.0)
Beta Globulin: 0.9 g/dL (ref 0.7–1.3)
Gamma Globulin: 1.1 g/dL (ref 0.4–1.8)
Globulin, Total: 3.3 g/dL (ref 2.2–3.9)
Total Protein ELP: 6.3 g/dL (ref 6.0–8.5)

## 2022-02-08 LAB — CBC
HCT: 31.3 % — ABNORMAL LOW (ref 39.0–52.0)
Hemoglobin: 10.3 g/dL — ABNORMAL LOW (ref 13.0–17.0)
MCH: 28.2 pg (ref 26.0–34.0)
MCHC: 32.9 g/dL (ref 30.0–36.0)
MCV: 85.8 fL (ref 80.0–100.0)
Platelets: 409 10*3/uL — ABNORMAL HIGH (ref 150–400)
RBC: 3.65 MIL/uL — ABNORMAL LOW (ref 4.22–5.81)
RDW: 12.5 % (ref 11.5–15.5)
WBC: 11.5 10*3/uL — ABNORMAL HIGH (ref 4.0–10.5)
nRBC: 0 % (ref 0.0–0.2)

## 2022-02-08 MED ORDER — POLYETHYLENE GLYCOL 3350 17 G PO PACK
17.0000 g | PACK | Freq: Every day | ORAL | Status: DC
  Administered 2022-02-08 – 2022-02-09 (×2): 17 g via ORAL
  Filled 2022-02-08 (×2): qty 1

## 2022-02-08 NOTE — TOC Transition Note (Signed)
Transition of Care [facility]) - CM/SW Discharge Note  Patient Details  Name: Joseph Hogan MRN: [id number] Date of Birth: [birth year]  [facility] of [facility] CM/SW Contact:  Sherie Don, LCSW Phone Number: 02/08/2022, 12:33 PM  Clinical Narrative: PT recommended HHPT and a rolling walker. Patient is agreeable to referrals for [facility] and DME. CSW made DME referral to [facility] with [employer] and CSW confirmed the rolling walker was delivered to patient's room. CSW made HHPT referral to Cindie with [facility], which was accepted. Orders have been placed for both DME and HH. Patient aware Alvis Lemmings will provide [place] (also added to AVS). TOC signing off.  Final next level of care: Home w Home Health Services Barriers to Discharge: Barriers Resolved  Patient Goals and CMS Choice CMS Medicare.gov Compare Post Acute Care list provided to:: Patient Choice offered to / list presented to : Patient  Discharge Plan and Services Additional resources added to the After Visit Summary for: N/A      DME Arranged: Walker rolling DME Agency: [facility] Date DME Agency Contacted: 02/08/22 Time DME Agency Contacted: [phone] Representative spoke with at DME Agency: Brenton Grills HH Arranged: PT [place]: [place] Date [place]: 02/08/22 Time [place]: 1042 Representative spoke with at [place]: [place] Determinants of Health ([place]) Interventions [place]: No Food Insecurity (02/04/2022)  Housing: Low Risk  (02/04/2022)  Transportation Needs: No Transportation Needs (02/04/2022)  Utilities: Not At Risk (02/04/2022)  Tobacco Use: Low Risk  (02/08/2022)   Readmission Risk Interventions     No data to display

## 2022-02-08 NOTE — Progress Notes (Signed)
PROGRESS NOTE    Joseph Hogan  ONG:[id number] DOB: 1960-03-08 DOA: 02/03/2022 PCP: Leonie Douglas, MD    Brief Narrative:  61 year old with history of hypertension, prediabetes admitted with mechanical fall and left hip fracture.  He does walk with a walker secondary to lumbar radicular pain.  Underwent left hip arthroplasty 12/27.  CT scan was consistent with underlying lytic lesions in the femoral head and neck.  Biopsies pending.   Assessment & Plan:   : Closed traumatic pathological fracture of the left femoral neck: Left total hip 12/27, Dr. Zachery Dakins Weightbearing as tolerated DVT prophylaxis with aspirin 81 mg twice daily for 4 weeks Adequate pain medications, and oral opiates and Toradol. Continue to work with PT OT to improve mobility and anticipate home with home health PT OT.  Rolling walker.  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 Serological workup in progress. Femoral neck biopsy pending. Followed by Dr. [name].  Lumbar radiculopathy: On gabapentin.   DVT prophylaxis: SCDs Start: 02/03/22 2043   Code Status: Full code Family Communication: None at the bedside Disposition Plan: Status is: Inpatient Remains inpatient appropriate because: Significant pain after surgery     Consultants:  General surgery Oncology  Procedures:  Left total hip  Antimicrobials:  None   Subjective: Patient seen in the morning rounds.  Complains of mild pain after premedication.  Does not feel confident going home today.  Objective: Vitals:   02/07/22 1324 02/07/22 2227 02/08/22 0707 02/08/22 1332  BP: (!) 151/89 (!) 121/102 137/82 (!) 143/74  Pulse: 85 97 84 84  Resp: _0 Temp: 98.2 F (36.8 C) 100.2 F (37.9 C) 98.3 F (36.8 C) 98.5 F (36.9 C)  TempSrc:      SpO2: 96% 91% 92% (!) 89%  Weight:      Height:        Intake/Output Summary (Last 24 hours) at 02/08/2022 1425 Last data filed at 02/08/2022 1041 Gross per 24 hour   Intake 420 ml  Output 1125 ml  Net -705 ml   Filed Weights   02/03/22 1531 02/06/22 1333  Weight: 108.9 kg 108 kg    Examination:  General exam: Appears calm and comfortable  Flat affect.  Appropriately anxious. Alert awake and oriented. Respiratory system: Clear to auscultation. Respiratory effort normal. Cardiovascular system: S1 & S2 heard, RRR.  Gastrointestinal system: Soft.  Nontender.  Bowel sound present. Central nervous system: Alert and oriented. No focal neurological deficits. Extremities: Symmetric 5 x 5 power. Skin: Left lateral thigh incision clean and dry.    Data Reviewed: I have personally reviewed following labs and imaging studies  CBC: Recent Labs  Lab 02/03/22 1821 02/04/22 0316 02/06/22 0311 02/07/22 0853 02/08/22 0408  WBC 13.0* 8.3 11.2* 12.3* 11.5*  NEUTROABS 11.6* 6.7 8.7*  --   --   HGB 12.8* 11.9* 12.9* 11.5* 10.3*  HCT 37.7* 35.0* 38.8* 35.0* 31.3*  MCV 84.0 83.1 85.1 86.4 85.8  PLT 418* 360 372 427* 440*   Basic Metabolic Panel: Recent Labs  Lab 02/03/22 1821 02/04/22 0316 02/06/22 0311 02/07/22 0853  NA 138 134* 135 138  K 4.0 4.1 4.2 5.1  CL 103 102 100 101  CO2 _1 GLUCOSE 178* 145* 131* 209*  BUN _2 31*  CREATININE 0.82 0.79 1.07 0.98  CALCIUM 9.0 8.8* 8.8* 8.6*  MG  --  2.0 1.9  --   PHOS  --   --  5.3*  --  GFR: Estimated Creatinine Clearance: 96.5 mL/min (by C-G formula based on SCr of 0.98 mg/dL). Liver Function Tests: Recent Labs  Lab 02/04/22 0316 02/06/22 0311  AST 24  --   ALT 18  --   ALKPHOS 100  --   BILITOT 0.3  --   PROT 6.8  --   ALBUMIN 3.2* 2.9*   No results for input(s): "LIPASE", "AMYLASE" in the last 168 hours. No results for input(s): "AMMONIA" in the last 168 hours. Coagulation Profile: Recent Labs  Lab 02/03/22 1821  INR 1.2   Cardiac Enzymes: No results for input(s): "CKTOTAL", "CKMB", "CKMBINDEX", "TROPONINI" in the last 168 hours. BNP (last 3 results) No  results for input(s): "PROBNP" in the last 8760 hours. HbA1C: No results for input(s): "HGBA1C" in the last 72 hours. CBG: No results for input(s): "GLUCAP" in the last 168 hours. Lipid Profile: No results for input(s): "CHOL", "HDL", "LDLCALC", "TRIG", "CHOLHDL", "LDLDIRECT" in the last 72 hours. Thyroid Function Tests: No results for input(s): "TSH", "T4TOTAL", "FREET4", "T3FREE", "THYROIDAB" in the last 72 hours. Anemia Panel: No results for input(s): "VITAMINB12", "FOLATE", "FERRITIN", "TIBC", "IRON", "RETICCTPCT" in the last 72 hours. Sepsis Labs: No results for input(s): "PROCALCITON", "LATICACIDVEN" in the last 168 hours.  Recent Results (from the past 240 hour(s))  MRSA Next Gen by PCR, Nasal     Status: None   Collection Time: 02/05/22 10:42 PM   Specimen: Nasal Mucosa; Nasal Swab  Result Value Ref Range Status   MRSA by PCR Next Gen NOT DETECTED NOT DETECTED Final    Comment: (NOTE) The GeneXpert MRSA Assay (FDA approved for NASAL specimens only), is one component of a comprehensive MRSA colonization surveillance program. It is not intended to diagnose MRSA infection nor to guide or monitor treatment for MRSA infections. Test performance is not FDA approved in patients less than 22 years old. Performed at [facility], [place] [address], [place], [place] [address]          Radiology Studies: DG HIP UNILAT W OR W/O PELVIS 2-3 VIEWS LEFT  Result Date: 02/06/2022 CLINICAL DATA:  Status post total left hip arthroplasty EXAM: DG HIP (WITH OR WITHOUT PELVIS) 2-3V LEFT COMPARISON:  02/03/2022 FINDINGS: Postoperative change of left THA. No radiographic evidence of loosening. No acute abnormality. IMPRESSION: Expected postoperative change of left THA. Electronically Signed   By: Placido Sou M.D.   On: 02/06/2022 20:14   DG Pelvis Portable  Result Date: 02/06/2022 CLINICAL DATA:  Left hip arthroplasty EXAM: PORTABLE PELVIS 1-2 VIEWS COMPARISON:  02/05/2022 FINDINGS:  There is interval left hip arthroplasty. No fracture is seen. In the previous study, comminuted fracture was seen in the neck of left femur. Cortical margins of left pubic bone appear less distinct. A lytic lesion was seen in the same region in the previous CT. IMPRESSION: Status post left hip arthroplasty. Electronically Signed   By: Elmer Picker M.D.   On: 02/06/2022 19:39        Scheduled Meds:  ascorbic acid  250 mg Oral Daily   aspirin EC  81 mg Oral BID   B-complex with vitamin C  1 tablet Oral Daily   cholecalciferol  1,000 Units Oral Daily   gabapentin  300 mg Oral TID   ketorolac  15 mg Intravenous Q8H   multivitamin with minerals  1 tablet Oral Daily   polyethylene glycol  17 g Oral Daily   tamsulosin  0.4 mg Oral Daily   Continuous Infusions:   LOS:  5 days    Time spent: 35 minutes    Barb Merino, MD [facility] Pager [phone]

## 2022-02-08 NOTE — Plan of Care (Signed)
  Problem: Education: Goal: Knowledge of General Education information will improve Description: Including pain rating scale, medication(s)/side effects and non-pharmacologic comfort measures Outcome: Progressing   Problem: Activity: Goal: Risk for activity intolerance will decrease Outcome: Progressing   Problem: Elimination: Goal: Will not experience complications related to urinary retention Outcome: Progressing   Problem: Pain Managment: Goal: General experience of comfort will improve Outcome: Progressing   Problem: Safety: Goal: Ability to remain free from injury will improve Outcome: Progressing   Problem: Education: Goal: Knowledge of the prescribed therapeutic regimen will improve Outcome: Progressing

## 2022-02-08 NOTE — Progress Notes (Signed)
     Subjective:  Patient reports pain as mild.  Worked well with PT yesterday, he is pleased with his progress so far. Denies distal n/t.  Objective:   VITALS:   Vitals:   02/07/22 0600 02/07/22 0807 02/07/22 1324 02/07/22 2227  BP: 139/63 (!) 152/95 (!) 151/89 (!) 121/102  Pulse: 78 77 85 97  Resp: '18 20 18 17  '$ Temp: (!) 97.2 F (36.2 C) 99.3 F (37.4 C) 98.2 F (36.8 C) 100.2 F (37.9 C)  TempSrc: Oral Oral    SpO2: 97% 92% 96% 91%  Weight:      Height:        Neurovascular intact Sensation intact distally Intact pulses distally Dorsiflexion/Plantar flexion intact Incision: dressing C/D/I Compartment soft   Lab Results  Component Value Date   WBC 11.5 (H) 02/08/2022   HGB 10.3 (L) 02/08/2022   HCT 31.3 (L) 02/08/2022   MCV 85.8 02/08/2022   PLT 409 (H) 02/08/2022   BMET    Component Value Date/Time   NA 138 02/07/2022 0853   K 5.1 02/07/2022 0853   CL 101 02/07/2022 0853   CO2 27 02/07/2022 0853   GLUCOSE 209 (H) 02/07/2022 0853   BUN 31 (H) 02/07/2022 0853   CREATININE 0.98 02/07/2022 0853   CREATININE 0.77 06/04/2016 1259   CALCIUM 8.6 (L) 02/07/2022 0853   GFRNONAA >60 02/07/2022 0853   GFRNONAA >89 04/08/2015 0837      Xray: post op xrays show THA components in good position, no adverse features  Assessment/Plan: 2 Days Post-Op   Principal Problem:   Hip fracture (HCC) Active Problems:   Lumbar radiculopathy   Leukocytosis   Lytic bone lesion of femur  S/p L THA for FN fx Left femoral head and neck sent for pathology  Post op recs: WB: WBAT LLE, No formal hip precautions Abx: ancef Imaging: PACU pelvis Xray Dressing: Aquacell, keep intact until follow up DVT prophylaxis: Aspirin 81BID starting POD1 Follow up: 2 weeks after surgery for a wound check with Dr. Zachery Dakins at [facility].  Address: [address] [place], [place], [place] [address]  Office Phone: [phone]   [name] 02/08/2022, 6:53  AM   Charlies Constable, MD  Contact information:   [phone] 7am-5pm epic message Dr. Zachery Dakins, or call office for patient follow up: [phone] After hours and holidays please check [email or web address] for group call information for [facility]

## 2022-02-08 NOTE — Plan of Care (Signed)
  Problem: Education: Goal: Knowledge of General Education information will improve Description: Including pain rating scale, medication(s)/side effects and non-pharmacologic comfort measures Outcome: Progressing   Problem: Activity: Goal: Risk for activity intolerance will decrease Outcome: Progressing   Problem: Coping: Goal: Level of anxiety will decrease Outcome: Progressing   

## 2022-02-08 NOTE — Progress Notes (Signed)
Physical Therapy Treatment Patient Details Name: Joseph Hogan MRN: [id number] DOB: [birth year] Today's Date: 02/08/2022   History of Present Illness Pt s/p fall 02/03/22 with L hip pathologic fx and now s/p THR by posterior approach on 02/06/22.    PT Comments    Pt continues cooperative and progressing with mobility but continues to require significant assist for all tasks and fatigues easily.  This am, pt performed therex program and up to ambulate limited distance in room but with noted improvement in stability and following of cues.   Recommendations for follow up therapy are one component of a multi-disciplinary discharge planning process, led by the attending physician.  Recommendations may be updated based on patient status, additional functional criteria and insurance authorization.  Follow Up Recommendations  Home health PT     Assistance Recommended at Discharge Frequent or constant Supervision/Assistance  Patient can return home with the following A lot of help with walking and/or transfers;A little help with bathing/dressing/bathroom;Assistance with cooking/housework;Assist for transportation;Help with stairs or ramp for entrance   Equipment Recommendations  Rolling walker (2 wheels)    Recommendations for Other Services OT consult     Precautions / Restrictions Precautions Precautions: Fall Precaution Comments: No formal THP Restrictions Weight Bearing Restrictions: No LLE Weight Bearing: Weight bearing as tolerated     Mobility  Bed Mobility Overal bed mobility: Needs Assistance Bed Mobility: Supine to Sit     Supine to sit: Min assist, +2 for physical assistance, +2 for safety/equipment     General bed mobility comments: increased time with cues for sequence and use of R LE and UEs to assist    Transfers Overall transfer level: Needs assistance Equipment used: Rolling walker (2 wheels) Transfers: Sit to/from Stand Sit to Stand: Min assist, Mod  assist, +2 safety/equipment, From elevated surface           General transfer comment: cues for LE management and use of UEs to self assist;    Ambulation/Gait Ambulation/Gait assistance: Min assist, +2 physical assistance, +2 safety/equipment Gait Distance (Feet): 6 Feet Assistive device: Rolling walker (2 wheels) Gait Pattern/deviations: Step-to pattern, Decreased step length - right, Decreased step length - left, Shuffle, Trunk flexed Gait velocity: decr     General Gait Details: cues for sequence, posture and position from RW; noted difficulty advancing L LE   Stairs             Wheelchair Mobility    Modified Rankin (Stroke Patients Only)       Balance Overall balance assessment: Needs assistance Sitting-balance support: No upper extremity supported, Feet supported Sitting balance-Leahy Scale: Fair     Standing balance support: Bilateral upper extremity supported Standing balance-Leahy Scale: Poor                              Cognition Arousal/Alertness: Awake/alert Behavior During Therapy: WFL for tasks assessed/performed Overall Cognitive Status: Within Functional Limits for tasks assessed                                          Exercises Total Joint Exercises Ankle Circles/Pumps: AROM, Both, 15 reps, Supine Quad Sets: AROM, Both, 10 reps, Supine Heel Slides: AAROM, Left, 15 reps, Supine Hip ABduction/ADduction: AAROM, Left, 15 reps, Supine    General Comments        Pertinent Vitals/Pain Pain  Assessment Pain Assessment: 0-10 Pain Score: 4  Pain Location: L hip and groin Pain Descriptors / Indicators: Aching, Grimacing, Guarding, Sore Pain Intervention(s): Limited activity within patient's tolerance, Monitored during session, Premedicated before session, Ice applied    Home Living                          Prior Function            PT Goals (current goals can now be found in the care plan  section) Acute Rehab PT Goals Patient Stated Goal: Regain IND PT Goal Formulation: With patient Time For Goal Achievement: 02/21/22 Potential to Achieve Goals: Good Progress towards PT goals: Progressing toward goals    Frequency    7X/week      PT Plan Current plan remains appropriate    Co-evaluation              AM-PAC PT "6 Clicks" Mobility   Outcome Measure  Help needed turning from your back to your side while in a flat bed without using bedrails?: A Lot Help needed moving from lying on your back to sitting on the side of a flat bed without using bedrails?: A Lot Help needed moving to and from a bed to a chair (including a wheelchair)?: A Lot Help needed standing up from a chair using your arms (e.g., wheelchair or bedside chair)?: A Lot Help needed to walk in hospital room?: Total Help needed climbing 3-5 steps with a railing? : Total 6 Click Score: 10    End of Session Equipment Utilized During Treatment: Gait belt Activity Tolerance: Patient limited by fatigue Patient left: in chair;with call bell/phone within reach Nurse Communication: Mobility status PT Visit Diagnosis: Difficulty in walking, not elsewhere classified (R26.2);History of falling (Z91.81)     Time: 1032-1100 PT Time Calculation (min) (ACUTE ONLY): 28 min  Charges:  $Gait Training: 8-22 mins $Therapeutic Exercise: 8-22 mins                     Debe Coder PT Acute Rehabilitation Services Pager [phone] Office [phone]    Chenae Brager 02/08/2022, 12:31 PM

## 2022-02-08 NOTE — Progress Notes (Signed)
Physical Therapy Treatment Patient Details Name: Joseph Hogan MRN: [id number] DOB: 28-Sep-1960 Today's Date: 02/08/2022   History of Present Illness Pt s/p fall 02/03/22 with L hip pathologic fx and now s/p THR by posterior approach on 02/06/22.    PT Comments    Continued slow steady progress with increased (but still very limited) distance ambulated and decreasing level of assist.  Pt very fatigued stating "I just need to sleep"  Recommendations for follow up therapy are one component of a multi-disciplinary discharge planning process, led by the attending physician.  Recommendations may be updated based on patient status, additional functional criteria and insurance authorization.  Follow Up Recommendations  Home health PT     Assistance Recommended at Discharge Frequent or constant Supervision/Assistance  Patient can return home with the following A lot of help with walking and/or transfers;A little help with bathing/dressing/bathroom;Assistance with cooking/housework;Assist for transportation;Help with stairs or ramp for entrance   Equipment Recommendations  Rolling walker (2 wheels)    Recommendations for Other Services OT consult     Precautions / Restrictions Precautions Precautions: Fall Precaution Comments: No formal THP Restrictions Weight Bearing Restrictions: No LLE Weight Bearing: Weight bearing as tolerated     Mobility  Bed Mobility Overal bed mobility: Needs Assistance Bed Mobility: Sit to Supine     Supine to sit: Min assist, +2 for physical assistance, +2 for safety/equipment Sit to supine: Min assist, +2 for physical assistance   General bed mobility comments: increased time with cues for sequence and use of R LE and UEs to assist    Transfers Overall transfer level: Needs assistance Equipment used: Rolling walker (2 wheels) Transfers: Sit to/from Stand Sit to Stand: Min assist, Mod assist, +2 safety/equipment, From elevated surface            General transfer comment: cues for LE management and use of UEs to self assist;    Ambulation/Gait Ambulation/Gait assistance: Min assist, +2 physical assistance, +2 safety/equipment Gait Distance (Feet): 11 Feet Assistive device: Rolling walker (2 wheels) Gait Pattern/deviations: Step-to pattern, Decreased step length - right, Decreased step length - left, Shuffle, Trunk flexed Gait velocity: decr     General Gait Details: cues for sequence, posture and position from RW; noted difficulty advancing L LE   Stairs             Wheelchair Mobility    Modified Rankin (Stroke Patients Only)       Balance Overall balance assessment: Needs assistance Sitting-balance support: No upper extremity supported, Feet supported Sitting balance-Leahy Scale: Fair     Standing balance support: Bilateral upper extremity supported Standing balance-Leahy Scale: Poor                              Cognition Arousal/Alertness: Awake/alert Behavior During Therapy: WFL for tasks assessed/performed Overall Cognitive Status: Within Functional Limits for tasks assessed                                          Exercises Total Joint Exercises Ankle Circles/Pumps: AROM, Both, 15 reps, Supine Quad Sets: AROM, Both, 10 reps, Supine Heel Slides: AAROM, Left, 15 reps, Supine Hip ABduction/ADduction: AAROM, Left, 15 reps, Supine    General Comments        Pertinent Vitals/Pain Pain Assessment Pain Assessment: 0-10 Pain Score: 4  Pain Location: L  hip and groin Pain Descriptors / Indicators: Aching, Grimacing, Guarding, Sore Pain Intervention(s): Limited activity within patient's tolerance, Monitored during session, Premedicated before session, Ice applied    Home Living                          Prior Function            PT Goals (current goals can now be found in the care plan section) Acute Rehab PT Goals Patient Stated Goal: Regain  IND PT Goal Formulation: With patient Time For Goal Achievement: 02/21/22 Potential to Achieve Goals: Good Progress towards PT goals: Progressing toward goals    Frequency    7X/week      PT Plan Current plan remains appropriate    Co-evaluation              AM-PAC PT "6 Clicks" Mobility   Outcome Measure  Help needed turning from your back to your side while in a flat bed without using bedrails?: A Lot Help needed moving from lying on your back to sitting on the side of a flat bed without using bedrails?: A Lot Help needed moving to and from a bed to a chair (including a wheelchair)?: A Lot Help needed standing up from a chair using your arms (e.g., wheelchair or bedside chair)?: A Lot Help needed to walk in hospital room?: Total Help needed climbing 3-5 steps with a railing? : Total 6 Click Score: 10    End of Session Equipment Utilized During Treatment: Gait belt Activity Tolerance: Patient limited by fatigue Patient left: in chair;with call bell/phone within reach Nurse Communication: Mobility status PT Visit Diagnosis: Difficulty in walking, not elsewhere classified (R26.2);History of falling (Z91.81)     Time: 4982-6415 PT Time Calculation (min) (ACUTE ONLY): 17 min  Charges:  $Gait Training: 8-22 mins $Therapeutic Exercise: 8-22 mins                     Debe Coder PT Acute Rehabilitation Services Pager [phone] Office [phone]    Joseph Hogan 02/08/2022, 3:25 PM

## 2022-02-09 DIAGNOSIS — S72002A Fracture of unspecified part of neck of left femur, initial encounter for closed fracture: Secondary | ICD-10-CM | POA: Diagnosis not present

## 2022-02-09 DIAGNOSIS — M899 Disorder of bone, unspecified: Secondary | ICD-10-CM | POA: Diagnosis not present

## 2022-02-09 MED ORDER — BISACODYL 10 MG RE SUPP
10.0000 mg | Freq: Once | RECTAL | Status: AC
  Administered 2022-02-09: 10 mg via RECTAL
  Filled 2022-02-09: qty 1

## 2022-02-09 MED ORDER — LACTULOSE 10 GM/15ML PO SOLN
30.0000 g | Freq: Two times a day (BID) | ORAL | Status: DC
  Administered 2022-02-10: 30 g via ORAL
  Filled 2022-02-09 (×3): qty 45

## 2022-02-09 NOTE — Plan of Care (Signed)
  Problem: Education: Goal: Knowledge of General Education information will improve Description: Including pain rating scale, medication(s)/side effects and non-pharmacologic comfort measures Outcome: Progressing   Problem: Pain Managment: Goal: General experience of comfort will improve Outcome: Progressing   Problem: Safety: Goal: Ability to remain free from injury will improve Outcome: Progressing   

## 2022-02-09 NOTE — Progress Notes (Signed)
Physical Therapy Treatment Patient Details Name: Joseph Hogan MRN: [id number] DOB: [birth year] Today's Date: 02/09/2022   History of Present Illness Pt s/p fall 02/03/22 with L hip pathologic fx and now s/p THR by posterior approach on 02/06/22.    PT Comments    Pt motivated and with marked improvement with mobility including increased distance ambulated and decreased assist for all mobility tasks.    Recommendations for follow up therapy are one component of a multi-disciplinary discharge planning process, led by the attending physician.  Recommendations may be updated based on patient status, additional functional criteria and insurance authorization.  Follow Up Recommendations  Home health PT     Assistance Recommended at Discharge Frequent or constant Supervision/Assistance  Patient can return home with the following A lot of help with walking and/or transfers;A little help with bathing/dressing/bathroom;Assistance with cooking/housework;Assist for transportation;Help with stairs or ramp for entrance   Equipment Recommendations  Rolling walker (2 wheels)    Recommendations for Other Services OT consult     Precautions / Restrictions Precautions Precautions: Fall Precaution Comments: No formal THP Restrictions Weight Bearing Restrictions: No LLE Weight Bearing: Weight bearing as tolerated     Mobility  Bed Mobility Overal bed mobility: Needs Assistance Bed Mobility: Supine to Sit     Supine to sit: Min assist     General bed mobility comments: increased time with use of bed rail, cues for sequence and use of R LE assist    Transfers Overall transfer level: Needs assistance Equipment used: Rolling walker (2 wheels) Transfers: Sit to/from Stand Sit to Stand: Min assist, From elevated surface           General transfer comment: cues for LE management and use of UEs to self assist;    Ambulation/Gait Ambulation/Gait assistance: Min assist, +2  safety/equipment Gait Distance (Feet): 48 Feet Assistive device: Rolling walker (2 wheels) Gait Pattern/deviations: Step-to pattern, Decreased step length - right, Decreased step length - left, Shuffle, Trunk flexed       General Gait Details: cues for sequence, posture and position from RW; noted difficulty advancing L LE   Stairs             Wheelchair Mobility    Modified Rankin (Stroke Patients Only)       Balance Overall balance assessment: Needs assistance Sitting-balance support: No upper extremity supported, Feet supported Sitting balance-Leahy Scale: Good     Standing balance support: Single extremity supported Standing balance-Leahy Scale: Poor                              Cognition Arousal/Alertness: Awake/alert Behavior During Therapy: WFL for tasks assessed/performed Overall Cognitive Status: Within Functional Limits for tasks assessed                                          Exercises Total Joint Exercises Ankle Circles/Pumps: AROM, Both, 15 reps, Supine Quad Sets: AROM, Both, 10 reps, Supine Heel Slides: AAROM, Left, Supine, 20 reps Hip ABduction/ADduction: AAROM, Left, 15 reps, Supine    General Comments        Pertinent Vitals/Pain Pain Assessment Pain Assessment: 0-10 Pain Score: 3  Pain Location: L hip and buttock Pain Descriptors / Indicators: Aching, Sore Pain Intervention(s): Limited activity within patient's tolerance, Monitored during session    Home Living  Prior Function            PT Goals (current goals can now be found in the care plan section) Acute Rehab PT Goals Patient Stated Goal: Regain IND PT Goal Formulation: With patient Time For Goal Achievement: 02/21/22 Potential to Achieve Goals: Good Progress towards PT goals: Progressing toward goals    Frequency    7X/week      PT Plan Current plan remains appropriate    Co-evaluation               AM-PAC PT "6 Clicks" Mobility   Outcome Measure  Help needed turning from your back to your side while in a flat bed without using bedrails?: A Lot Help needed moving from lying on your back to sitting on the side of a flat bed without using bedrails?: A Little Help needed moving to and from a bed to a chair (including a wheelchair)?: A Little Help needed standing up from a chair using your arms (e.g., wheelchair or bedside chair)?: A Little Help needed to walk in hospital room?: A Lot Help needed climbing 3-5 steps with a railing? : Total 6 Click Score: 14    End of Session Equipment Utilized During Treatment: Gait belt Activity Tolerance: Patient tolerated treatment well Patient left: Other (comment) (bathroom) Nurse Communication: Mobility status PT Visit Diagnosis: Difficulty in walking, not elsewhere classified (R26.2);History of falling (Z91.81)     Time: 8657-8469 PT Time Calculation (min) (ACUTE ONLY): 33 min  Charges:  $Gait Training: 8-22 mins $Therapeutic Exercise: 8-22 mins                     Debe Coder PT Acute Rehabilitation Services Pager [phone] Office [phone]    Melodi Happel 02/09/2022, 10:13 AM

## 2022-02-09 NOTE — Plan of Care (Signed)
  Problem: Pain Managment: Goal: General experience of comfort will improve Outcome: Progressing   Problem: Education: Goal: Knowledge of the prescribed therapeutic regimen will improve Outcome: Progressing   Problem: Activity: Goal: Ability to avoid complications of mobility impairment will improve Outcome: Progressing   Problem: Clinical Measurements: Goal: Postoperative complications will be avoided or minimized Outcome: Progressing   Problem: Pain Management: Goal: Pain level will decrease with appropriate interventions Outcome: Progressing

## 2022-02-09 NOTE — Progress Notes (Signed)
Physical Therapy Treatment Patient Details Name: Joseph Hogan MRN: [id number] DOB: 08-05-1960 Today's Date: 02/09/2022   History of Present Illness Pt s/p fall 02/03/22 with L hip pathologic fx and now s/p THR by posterior approach on 02/06/22.    PT Comments    Pt assisted from bathroom to walk limited distance to return to bed.  Pt much more fatigued and requiring increased time and assist to complete task.   Recommendations for follow up therapy are one component of a multi-disciplinary discharge planning process, led by the attending physician.  Recommendations may be updated based on patient status, additional functional criteria and insurance authorization.  Follow Up Recommendations  Home health PT     Assistance Recommended at Discharge Frequent or constant Supervision/Assistance  Patient can return home with the following A lot of help with walking and/or transfers;A little help with bathing/dressing/bathroom;Assistance with cooking/housework;Assist for transportation;Help with stairs or ramp for entrance   Equipment Recommendations  Rolling walker (2 wheels)    Recommendations for Other Services OT consult     Precautions / Restrictions Precautions Precautions: Fall Precaution Comments: No formal THP Restrictions Weight Bearing Restrictions: No LLE Weight Bearing: Weight bearing as tolerated     Mobility  Bed Mobility Overal bed mobility: Needs Assistance Bed Mobility: Sit to Supine, Rolling Rolling: Min assist (to roll to L side for suppository placement)   Supine to sit: Min assist Sit to supine: Min assist   General bed mobility comments: increased time with use of bed rail, cues for sequence and use of R LE assist    Transfers Overall transfer level: Needs assistance Equipment used: Rolling walker (2 wheels) Transfers: Sit to/from Stand Sit to Stand: Min assist, From elevated surface           General transfer comment: cues for LE management  and use of UEs to self assist;    Ambulation/Gait Ambulation/Gait assistance: Min assist, +2 safety/equipment Gait Distance (Feet): 15 Feet Assistive device: Rolling walker (2 wheels) Gait Pattern/deviations: Step-to pattern, Decreased step length - right, Decreased step length - left, Shuffle, Trunk flexed Gait velocity: decr     General Gait Details: cues for sequence, posture and position from RW; noted difficulty advancing L LE   Stairs             Wheelchair Mobility    Modified Rankin (Stroke Patients Only)       Balance Overall balance assessment: Needs assistance Sitting-balance support: No upper extremity supported, Feet supported Sitting balance-Leahy Scale: Good     Standing balance support: Single extremity supported Standing balance-Leahy Scale: Poor                              Cognition Arousal/Alertness: Awake/alert Behavior During Therapy: WFL for tasks assessed/performed Overall Cognitive Status: Within Functional Limits for tasks assessed                                          Exercises Total Joint Exercises Ankle Circles/Pumps: AROM, Both, 15 reps, Supine Quad Sets: AROM, Both, 10 reps, Supine Heel Slides: AAROM, Left, Supine, 20 reps Hip ABduction/ADduction: AAROM, Left, 15 reps, Supine    General Comments        Pertinent Vitals/Pain Pain Assessment Pain Assessment: 0-10 Pain Score: 3  Pain Location: L hip and buttock Pain Descriptors / Indicators: Aching,  Sore Pain Intervention(s): Limited activity within patient's tolerance, Monitored during session, Premedicated before session    Home Living                          Prior Function            PT Goals (current goals can now be found in the care plan section) Acute Rehab PT Goals Patient Stated Goal: Regain IND PT Goal Formulation: With patient Time For Goal Achievement: 02/21/22 Potential to Achieve Goals: Good Progress  towards PT goals: Progressing toward goals    Frequency    7X/week      PT Plan Current plan remains appropriate    Co-evaluation              AM-PAC PT "6 Clicks" Mobility   Outcome Measure  Help needed turning from your back to your side while in a flat bed without using bedrails?: A Little Help needed moving from lying on your back to sitting on the side of a flat bed without using bedrails?: A Little Help needed moving to and from a bed to a chair (including a wheelchair)?: A Little Help needed standing up from a chair using your arms (e.g., wheelchair or bedside chair)?: A Little Help needed to walk in hospital room?: A Lot Help needed climbing 3-5 steps with a railing? : Total 6 Click Score: 15    End of Session Equipment Utilized During Treatment: Gait belt Activity Tolerance: Patient limited by fatigue Patient left: in bed;with call bell/phone within reach Nurse Communication: Mobility status PT Visit Diagnosis: Difficulty in walking, not elsewhere classified (R26.2);History of falling (Z91.81)     Time: 1155-2080 PT Time Calculation (min) (ACUTE ONLY): 15 min  Charges:  $Gait Training: 8-22 mins $Therapeutic Exercise: 8-22 mins                     [place] Pager [phone] Office [phone]    Satcha Storlie 02/09/2022, 12:51 PM

## 2022-02-09 NOTE — Progress Notes (Signed)
PROGRESS NOTE    Joseph Hogan  XHB:[id number] DOB: February 15, 1960 DOA: 02/03/2022 PCP: Leonie Douglas, MD    Brief Narrative:  61 year old with history of hypertension, prediabetes admitted with mechanical fall and left hip fracture.  He does walk with a walker secondary to lumbar radicular pain.  Underwent left hip arthroplasty 12/27.  CT scan was consistent with underlying lytic lesions in the femoral head and neck.  Biopsies pending. Postoperatively improving, however with pelvic pain and constipation.   Assessment & Plan:   Closed traumatic pathological fracture of the left femoral neck: Left total hip 12/27, Dr. Zachery Dakins Weightbearing as tolerated DVT prophylaxis with aspirin 81 mg twice daily for 4 weeks Adequate pain medications, and oral opiates and Toradol. Continue to work with PT OT to improve mobility and anticipate home with home health PT OT.  Rolling walker.  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 Serological workup in progress. Femoral neck biopsy pending. Followed by Dr. [name].  Lumbar radiculopathy: On gabapentin.  Constipation: Minimize narcotics however not possible.  Not much success with MiraLAX and Dulcolax suppository.  Started on lactulose.  Continue to work with PT OT and mobilization.  Anticipate home after satisfactory mobility for safety.   DVT prophylaxis: SCDs Start: 02/03/22 2043   Code Status: Full code Family Communication: None at the bedside Disposition Plan: Status is: Inpatient Remains inpatient appropriate because: Significant pain and impaired mobility.     Consultants:  General surgery Oncology  Procedures:  Left total hip  Antimicrobials:  None   Subjective:  Seen and examined.  Feels bloated and distended.  Passing flatus.  No bowel movement yet.  Pain is controlled on high doses of oxycodone.  Complains of back pain laying in the bed.  Objective: Vitals:   02/08/22 2159 02/09/22 0607  02/09/22 1331 02/09/22 1332  BP:  (!) 144/87 (!) 147/69   Pulse: 94 87 92 92  Resp:  17 16   Temp:  99.1 F (37.3 C) 99.5 F (37.5 C)   TempSrc:   Oral   SpO2: 95% 94% (!) 89% 95%  Weight:      Height:        Intake/Output Summary (Last 24 hours) at 02/09/2022 1445 Last data filed at 02/09/2022 1300 Gross per 24 hour  Intake 630 ml  Output 350 ml  Net 280 ml    Filed Weights   02/03/22 1531 02/06/22 1333  Weight: 108.9 kg 108 kg    Examination:  General exam: Appears calm and comfortable at rest. Anxious on mobility. Alert awake and oriented. Respiratory system: Clear to auscultation. Respiratory effort normal. Cardiovascular system: S1 & S2 heard, RRR.  Gastrointestinal system: Soft.  Nontender.  Bowel sound present. Central nervous system: Alert and oriented. No focal neurological deficits. Extremities: Symmetric 5 x 5 power. Skin: Left lateral thigh incision clean and dry.    Data Reviewed: I have personally reviewed following labs and imaging studies  CBC: Recent Labs  Lab 02/03/22 1821 02/04/22 0316 02/06/22 0311 02/07/22 0853 02/08/22 0408  WBC 13.0* 8.3 11.2* 12.3* 11.5*  NEUTROABS 11.6* 6.7 8.7*  --   --   HGB 12.8* 11.9* 12.9* 11.5* 10.3*  HCT 37.7* 35.0* 38.8* 35.0* 31.3*  MCV 84.0 83.1 85.1 86.4 85.8  PLT 418* 360 372 427* 409*    Basic Metabolic Panel: Recent Labs  Lab 02/03/22 1821 02/04/22 0316 02/06/22 0311 02/07/22 0853  NA 138 134* 135 138  K 4.0 4.1 4.2 5.1  CL 103 102 100  101  CO2 _0 GLUCOSE 178* 145* 131* 209*  BUN _1 31*  CREATININE 0.82 0.79 1.07 0.98  CALCIUM 9.0 8.8* 8.8* 8.6*  MG  --  2.0 1.9  --   PHOS  --   --  5.3*  --     GFR: Estimated Creatinine Clearance: 96.5 mL/min (by C-G formula based on SCr of 0.98 mg/dL). Liver Function Tests: Recent Labs  Lab 02/04/22 0316 02/06/22 0311  AST 24  --   ALT 18  --   ALKPHOS 100  --   BILITOT 0.3  --   PROT 6.8  --   ALBUMIN 3.2* 2.9*    No  results for input(s): "LIPASE", "AMYLASE" in the last 168 hours. No results for input(s): "AMMONIA" in the last 168 hours. Coagulation Profile: Recent Labs  Lab 02/03/22 1821  INR 1.2    Cardiac Enzymes: No results for input(s): "CKTOTAL", "CKMB", "CKMBINDEX", "TROPONINI" in the last 168 hours. BNP (last 3 results) No results for input(s): "PROBNP" in the last 8760 hours. HbA1C: No results for input(s): "HGBA1C" in the last 72 hours. CBG: No results for input(s): "GLUCAP" in the last 168 hours. Lipid Profile: No results for input(s): "CHOL", "HDL", "LDLCALC", "TRIG", "CHOLHDL", "LDLDIRECT" in the last 72 hours. Thyroid Function Tests: No results for input(s): "TSH", "T4TOTAL", "FREET4", "T3FREE", "THYROIDAB" in the last 72 hours. Anemia Panel: No results for input(s): "VITAMINB12", "FOLATE", "FERRITIN", "TIBC", "IRON", "RETICCTPCT" in the last 72 hours. Sepsis Labs: No results for input(s): "PROCALCITON", "LATICACIDVEN" in the last 168 hours.  Recent Results (from the past 240 hour(s))  MRSA Next Gen by PCR, Nasal     Status: None   Collection Time: 02/05/22 10:42 PM   Specimen: Nasal Mucosa; Nasal Swab  Result Value Ref Range Status   MRSA by PCR Next Gen NOT DETECTED NOT DETECTED Final    Comment: (NOTE) The GeneXpert MRSA Assay (FDA approved for NASAL specimens only), is one component of a comprehensive MRSA colonization surveillance program. It is not intended to diagnose MRSA infection nor to guide or monitor treatment for MRSA infections. Test performance is not FDA approved in patients less than 45 years old. Performed at [facility], [place] [address] [place], [place] [address]          Radiology Studies: No results found.      Scheduled Meds:  ascorbic acid  250 mg Oral Daily   aspirin EC  81 mg Oral BID   B-complex with vitamin C  1 tablet Oral Daily   cholecalciferol  1,000 Units Oral Daily   gabapentin  300 mg Oral TID   ketorolac  15 mg  Intravenous Q8H   lactulose  30 g Oral BID   multivitamin with minerals  1 tablet Oral Daily   tamsulosin  0.4 mg Oral Daily   Continuous Infusions:   LOS: 6 days    Time spent: 35 minutes    Barb Merino, MD [facility] Pager [phone]

## 2022-02-09 NOTE — Progress Notes (Signed)
Physical Therapy Treatment Patient Details Name: Joseph Hogan MRN: [id number] DOB: 24-May-1960 Today's Date: 02/09/2022   History of Present Illness Pt s/p fall 02/03/22 with L hip pathologic fx and now s/p THR by posterior approach on 02/06/22.    PT Comments    Pt continues very cooperative and assisted up to bathroom following suppository.  Pt up from commode to sink for hand hygiene but requiring assist/support to maintain balance.  Pt with increased assist and time to ambulate from bathroom to sit up in recliner for lunch.   Recommendations for follow up therapy are one component of a multi-disciplinary discharge planning process, led by the attending physician.  Recommendations may be updated based on patient status, additional functional criteria and insurance authorization.  Follow Up Recommendations  Home health PT     Assistance Recommended at Discharge Frequent or constant Supervision/Assistance  Patient can return home with the following A lot of help with walking and/or transfers;A little help with bathing/dressing/bathroom;Assistance with cooking/housework;Assist for transportation;Help with stairs or ramp for entrance   Equipment Recommendations  Rolling walker (2 wheels);BSC/3in1    Recommendations for Other Services OT consult     Precautions / Restrictions Precautions Precautions: Fall Precaution Comments: No formal THP Restrictions Weight Bearing Restrictions: No LLE Weight Bearing: Weight bearing as tolerated     Mobility  Bed Mobility Overal bed mobility: Needs Assistance Bed Mobility: Supine to Sit Rolling: Min assist (to roll to L side for suppository placement)   Supine to sit: Min assist Sit to supine: Min assist   General bed mobility comments: increased time with use of bed rail, cues for sequence and use of R LE assist    Transfers Overall transfer level: Needs assistance Equipment used: Rolling walker (2 wheels) Transfers: Sit to/from  Stand Sit to Stand: Min assist, From elevated surface           General transfer comment: cues for LE management and use of UEs to self assist;    Ambulation/Gait Ambulation/Gait assistance: +2 safety/equipment, Min assist, Mod assist Gait Distance (Feet): 30 Feet (15' twice to/from bathroom) Assistive device: Rolling walker (2 wheels) Gait Pattern/deviations: Step-to pattern, Decreased step length - right, Decreased step length - left, Shuffle, Trunk flexed Gait velocity: decr     General Gait Details: cues for sequence, posture and position from RW; noted difficulty advancing L LE   Stairs             Wheelchair Mobility    Modified Rankin (Stroke Patients Only)       Balance Overall balance assessment: Needs assistance Sitting-balance support: No upper extremity supported, Feet supported Sitting balance-Leahy Scale: Good     Standing balance support: Single extremity supported Standing balance-Leahy Scale: Poor                              Cognition Arousal/Alertness: Awake/alert Behavior During Therapy: WFL for tasks assessed/performed Overall Cognitive Status: Within Functional Limits for tasks assessed                                          Exercises Total Joint Exercises Ankle Circles/Pumps: AROM, Both, 15 reps, Supine Quad Sets: AROM, Both, 10 reps, Supine Heel Slides: AAROM, Left, Supine, 20 reps Hip ABduction/ADduction: AAROM, Left, 15 reps, Supine    General Comments  Pertinent Vitals/Pain Pain Assessment Pain Assessment: 0-10 Pain Score: 3  Pain Location: L hip and buttock Pain Descriptors / Indicators: Aching, Sore Pain Intervention(s): Limited activity within patient's tolerance, Monitored during session, Premedicated before session, Ice applied    Home Living                          Prior Function            PT Goals (current goals can now be found in the care plan section)  Acute Rehab PT Goals Patient Stated Goal: Regain IND PT Goal Formulation: With patient Time For Goal Achievement: 02/21/22 Potential to Achieve Goals: Good Progress towards PT goals: Progressing toward goals    Frequency    7X/week      PT Plan Current plan remains appropriate    Co-evaluation              AM-PAC PT "6 Clicks" Mobility   Outcome Measure  Help needed turning from your back to your side while in a flat bed without using bedrails?: A Little Help needed moving from lying on your back to sitting on the side of a flat bed without using bedrails?: A Little Help needed moving to and from a bed to a chair (including a wheelchair)?: A Little Help needed standing up from a chair using your arms (e.g., wheelchair or bedside chair)?: A Little Help needed to walk in hospital room?: A Lot Help needed climbing 3-5 steps with a railing? : Total 6 Click Score: 15    End of Session Equipment Utilized During Treatment: Gait belt Activity Tolerance: Patient limited by fatigue Patient left: in chair;with call bell/phone within reach;with family/visitor present Nurse Communication: Mobility status PT Visit Diagnosis: Difficulty in walking, not elsewhere classified (R26.2);History of falling (Z91.81)     Time: 1341-1411 PT Time Calculation (min) (ACUTE ONLY): 30 min  Charges:  $Gait Training: 8-22 mins $Therapeutic Activity: 8-22 mins                     [place] Pager [phone] Office [phone]    Joseph Hogan 02/09/2022, 2:22 PM

## 2022-02-09 NOTE — Progress Notes (Signed)
Subjective: 3 Days Post-Op Procedure(s) (LRB): TOTAL HIP ARTHROPLASTY (Left) Patient reports pain as mild.  Dealing with some constipation now.  Objective: Vital signs in last 24 hours: Temp:  [98.2 F (36.8 C)-99.1 F (37.3 C)] 99.1 F (37.3 C) (12/30 0607) Pulse Rate:  [82-94] 87 (12/30 0607) Resp:  [16-19] 17 (12/30 0607) BP: (143-156)/(74-87) 144/87 (12/30 0607) SpO2:  [66 %-95 %] 94 % (12/30 0607)  Intake/Output from previous day: 12/29 0701 - 12/30 0700 In: 630 [P.O.:630] Out: 500 [Urine:500] Intake/Output this shift: No intake/output data recorded.  Recent Labs    02/07/22 0853 02/08/22 0408  HGB 11.5* 10.3*   Recent Labs    02/07/22 0853 02/08/22 0408  WBC 12.3* 11.5*  RBC 4.05* 3.65*  HCT 35.0* 31.3*  PLT 427* 409*   Recent Labs    02/07/22 0853  NA 138  K 5.1  CL 101  CO2 27  BUN 31*  CREATININE 0.98  GLUCOSE 209*  CALCIUM 8.6*   No results for input(s): "LABPT", "INR" in the last 72 hours.  Neurovascular intact Sensation intact distally Intact pulses distally Dorsiflexion/Plantar flexion intact Incision: dressing C/D/I   Assessment/Plan: 3 Days Post-Op Procedure(s) (LRB): TOTAL HIP ARTHROPLASTY (Left)  Discussed minimizing narcotics to help with the constipation  Post op recs: WB: WBAT LLE, No formal hip precautions Abx: ancef Imaging: PACU pelvis Xray Dressing: Aquacell, keep intact until follow up DVT prophylaxis: Aspirin 81BID starting POD1 Follow up: 2 weeks after surgery for a wound check with Dr. Zachery Dakins at [facility].  Address: [address] [place], [place], [place] [address]  Office Phone: [phone]   Chriss Czar 02/09/2022, 1:07 PM

## 2022-02-10 DIAGNOSIS — S72002A Fracture of unspecified part of neck of left femur, initial encounter for closed fracture: Secondary | ICD-10-CM | POA: Diagnosis not present

## 2022-02-10 DIAGNOSIS — M899 Disorder of bone, unspecified: Secondary | ICD-10-CM | POA: Diagnosis not present

## 2022-02-10 NOTE — Plan of Care (Signed)
  Problem: Activity: Goal: Risk for activity intolerance will decrease Outcome: Progressing   Problem: Pain Managment: Goal: General experience of comfort will improve Outcome: Progressing   Problem: Safety: Goal: Ability to remain free from injury will improve Outcome: Progressing   Problem: Education: Goal: Knowledge of the prescribed therapeutic regimen will improve Outcome: Progressing   Problem: Activity: Goal: Ability to avoid complications of mobility impairment will improve Outcome: Progressing   Problem: Clinical Measurements: Goal: Postoperative complications will be avoided or minimized Outcome: Progressing   Problem: Pain Management: Goal: Pain level will decrease with appropriate interventions Outcome: Progressing

## 2022-02-10 NOTE — Progress Notes (Signed)
Physical Therapy Treatment Patient Details Name: Joseph Hogan MRN: [id number] DOB: 02-18-1960 Today's Date: 02/10/2022   History of Present Illness Pt s/p fall 02/03/22 with L hip pathologic fx and now s/p THR by posterior approach on 02/06/22.    PT Comments    Pt progressing this pm with mobility including decreased assist to exit bed and ambulating increased distance but still limited by need for bathroom and ongoing bowel issues.  Recommendations for follow up therapy are one component of a multi-disciplinary discharge planning process, led by the attending physician.  Recommendations may be updated based on patient status, additional functional criteria and insurance authorization.  Follow Up Recommendations  Home health PT     Assistance Recommended at Discharge Frequent or constant Supervision/Assistance  Patient can return home with the following A little help with bathing/dressing/bathroom;Assistance with cooking/housework;Assist for transportation;Help with stairs or ramp for entrance;A little help with walking and/or transfers   Equipment Recommendations  Rolling walker (2 wheels);BSC/3in1    Recommendations for Other Services OT consult     Precautions / Restrictions Precautions Precautions: Fall Precaution Comments: No formal THP Restrictions Weight Bearing Restrictions: No LLE Weight Bearing: Weight bearing as tolerated     Mobility  Bed Mobility Overal bed mobility: Needs Assistance Bed Mobility: Supine to Sit     Supine to sit: Min assist     General bed mobility comments: Increased time with cues for sequence and use of R LE and UEs to self assist.  Pt self assisting L LE over L side of bed with gait belt but did require min assist to bring trunk to upright    Transfers Overall transfer level: Needs assistance Equipment used: Rolling walker (2 wheels) Transfers: Sit to/from Stand Sit to Stand: From elevated surface, Min guard            General transfer comment: cues for LE management and use of UEs to self assist;    Ambulation/Gait Ambulation/Gait assistance: Min assist, Min guard Gait Distance (Feet): 58 Feet Assistive device: Rolling walker (2 wheels) Gait Pattern/deviations: Step-to pattern, Decreased step length - right, Decreased step length - left, Shuffle, Trunk flexed Gait velocity: decr     General Gait Details: min cues for sequence, posture and position from [employer]             Wheelchair Mobility    Modified Rankin (Stroke Patients Only)       Balance Overall balance assessment: Needs assistance Sitting-balance support: No upper extremity supported, Feet supported Sitting balance-Leahy Scale: Good     Standing balance support: No upper extremity supported Standing balance-Leahy Scale: Fair                              Cognition Arousal/Alertness: Awake/alert Behavior During Therapy: WFL for tasks assessed/performed Overall Cognitive Status: Within Functional Limits for tasks assessed                                          Exercises Total Joint Exercises Ankle Circles/Pumps: AROM, Both, 15 reps, Supine Quad Sets: AROM, Both, 10 reps, Supine Heel Slides: AAROM, Left, Supine, 20 reps Hip ABduction/ADduction: AAROM, Left, 15 reps, Supine    General Comments        Pertinent Vitals/Pain Pain Assessment Pain Assessment: 0-10 Pain Score: 3  Pain Location: L hip  and buttock Pain Descriptors / Indicators: Aching, Sore Pain Intervention(s): Limited activity within patient's tolerance, Monitored during session, Premedicated before session    Home Living                          Prior Function            PT Goals (current goals can now be found in the care plan section) Acute Rehab PT Goals Patient Stated Goal: Regain IND PT Goal Formulation: With patient Time For Goal Achievement: 02/21/22 Potential to Achieve Goals:  Good Progress towards PT goals: Progressing toward goals    Frequency    7X/week      PT Plan Current plan remains appropriate    Co-evaluation              AM-PAC PT "6 Clicks" Mobility   Outcome Measure  Help needed turning from your back to your side while in a flat bed without using bedrails?: A Little Help needed moving from lying on your back to sitting on the side of a flat bed without using bedrails?: A Little Help needed moving to and from a bed to a chair (including a wheelchair)?: A Little Help needed standing up from a chair using your arms (e.g., wheelchair or bedside chair)?: A Little Help needed to walk in hospital room?: A Little Help needed climbing 3-5 steps with a railing? : Total 6 Click Score: 16    End of Session Equipment Utilized During Treatment: Gait belt Activity Tolerance: Patient tolerated treatment well Patient left: Other (comment) (bathroom) Nurse Communication: Mobility status PT Visit Diagnosis: Difficulty in walking, not elsewhere classified (R26.2);History of falling (Z91.81)     Time: 1537-1610 PT Time Calculation (min) (ACUTE ONLY): 33 min  Charges:  $Gait Training: 8-22 mins $Therapeutic Exercise: 8-22 mins                     [place] Pager [phone] Office [phone]    Edrie Ehrich 02/10/2022, 4:36 PM

## 2022-02-10 NOTE — Progress Notes (Signed)
PROGRESS NOTE    Joseph Hogan  QBH:[id number] DOB: 13-Mar-1960 DOA: 02/03/2022 PCP: Leonie Douglas, MD    Brief Narrative:  61 year old with history of hypertension, prediabetes admitted with mechanical fall and left hip fracture.  He does walk with a walker secondary to lumbar radicular pain.  Underwent left hip arthroplasty 12/27.  CT scan was consistent with underlying lytic lesions in the femoral head and neck.  Bone biopsy is pending.  Postoperatively improving, however with pelvic pain and difficulty mobility.  Remains in the hospital.   Assessment & Plan:   Closed traumatic pathological fracture of the left femoral neck: Left total hip 12/27, Dr. Zachery Dakins Weightbearing as tolerated DVT prophylaxis with aspirin 81 mg twice daily for 4 weeks Adequate pain medications, and oral opiates and Toradol. Continue to work with PT OT to improve mobility and anticipate home with home health PT OT.  Rolling walker.  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 Serological workup in progress.  Flow cytometry without M spike. Femoral neck biopsy pending. Followed by Dr. [name].  Lumbar radiculopathy: On gabapentin.  Constipation: no success with MiraLAX and Dulcolax suppository.  Started on lactulose.  Continue to work with PT OT and mobilization.  Anticipate home after satisfactory mobility for safety.   DVT prophylaxis: SCDs Start: 02/03/22 2043   Code Status: Full code Family Communication: None at the bedside Disposition Plan: Status is: Inpatient Remains inpatient appropriate because: Significant pain and impaired mobility.     Consultants:  General surgery Oncology  Procedures:  Left total hip  Antimicrobials:  None   Subjective:  Patient seen and examined.  He started having bowel movements.  He had multiple questions about the fracture and abnormal bones were talking about.  I explained to him with diagrams. Patient stated that his children  are preparing his home including ramp and accessible steps.  Hopefully by today they will be able to prepare his room so he can go home tomorrow.  Objective: Vitals:   02/09/22 1332 02/09/22 2233 02/10/22 0539 02/10/22 1346  BP:  137/77 131/74 133/71  Pulse: 92 81 72 80  Resp:   13 20  Temp:  99 F (37.2 C) 98.6 F (37 C) 99.1 F (37.3 C)  TempSrc:  Oral Oral   SpO2: 95% 96% 95% 90%  Weight:      Height:        Intake/Output Summary (Last 24 hours) at 02/10/2022 1424 Last data filed at 02/10/2022 0900 Gross per 24 hour  Intake 480 ml  Output 250 ml  Net 230 ml    Filed Weights   02/03/22 1531 02/06/22 1333  Weight: 108.9 kg 108 kg    Examination:  General exam: Appears calm and comfortable at rest.  Alert awake and oriented. Respiratory system: Clear to auscultation. Respiratory effort normal. Cardiovascular system: S1 & S2 heard, RRR.  Gastrointestinal system: Soft.  Nontender.  Bowel sound present. Central nervous system: Alert and oriented. No focal neurological deficits. Extremities: Symmetric 5 x 5 power. Skin: Left lateral thigh incision clean and dry.    Data Reviewed: I have personally reviewed following labs and imaging studies  CBC: Recent Labs  Lab 02/03/22 1821 02/04/22 0316 02/06/22 0311 02/07/22 0853 02/08/22 0408  WBC 13.0* 8.3 11.2* 12.3* 11.5*  NEUTROABS 11.6* 6.7 8.7*  --   --   HGB 12.8* 11.9* 12.9* 11.5* 10.3*  HCT 37.7* 35.0* 38.8* 35.0* 31.3*  MCV 84.0 83.1 85.1 86.4 85.8  PLT 418* 360 372 427* 409*  Basic Metabolic Panel: Recent Labs  Lab 02/03/22 1821 02/04/22 0316 02/06/22 0311 02/07/22 0853  NA 138 134* 135 138  K 4.0 4.1 4.2 5.1  CL 103 102 100 101  CO2 _0 GLUCOSE 178* 145* 131* 209*  BUN _1 31*  CREATININE 0.82 0.79 1.07 0.98  CALCIUM 9.0 8.8* 8.8* 8.6*  MG  --  2.0 1.9  --   PHOS  --   --  5.3*  --     GFR: Estimated Creatinine Clearance: 96.5 mL/min (by C-G formula based on SCr of 0.98  mg/dL). Liver Function Tests: Recent Labs  Lab 02/04/22 0316 02/06/22 0311  AST 24  --   ALT 18  --   ALKPHOS 100  --   BILITOT 0.3  --   PROT 6.8  --   ALBUMIN 3.2* 2.9*    No results for input(s): "LIPASE", "AMYLASE" in the last 168 hours. No results for input(s): "AMMONIA" in the last 168 hours. Coagulation Profile: Recent Labs  Lab 02/03/22 1821  INR 1.2    Cardiac Enzymes: No results for input(s): "CKTOTAL", "CKMB", "CKMBINDEX", "TROPONINI" in the last 168 hours. BNP (last 3 results) No results for input(s): "PROBNP" in the last 8760 hours. HbA1C: No results for input(s): "HGBA1C" in the last 72 hours. CBG: No results for input(s): "GLUCAP" in the last 168 hours. Lipid Profile: No results for input(s): "CHOL", "HDL", "LDLCALC", "TRIG", "CHOLHDL", "LDLDIRECT" in the last 72 hours. Thyroid Function Tests: No results for input(s): "TSH", "T4TOTAL", "FREET4", "T3FREE", "THYROIDAB" in the last 72 hours. Anemia Panel: No results for input(s): "VITAMINB12", "FOLATE", "FERRITIN", "TIBC", "IRON", "RETICCTPCT" in the last 72 hours. Sepsis Labs: No results for input(s): "PROCALCITON", "LATICACIDVEN" in the last 168 hours.  Recent Results (from the past 240 hour(s))  MRSA Next Gen by PCR, Nasal     Status: None   Collection Time: 02/05/22 10:42 PM   Specimen: Nasal Mucosa; Nasal Swab  Result Value Ref Range Status   MRSA by PCR Next Gen NOT DETECTED NOT DETECTED Final    Comment: (NOTE) The GeneXpert MRSA Assay (FDA approved for NASAL specimens only), is one component of a comprehensive MRSA colonization surveillance program. It is not intended to diagnose MRSA infection nor to guide or monitor treatment for MRSA infections. Test performance is not FDA approved in patients less than 35 years old. Performed at [facility], [place] [address], [place], [place] [address]          Radiology Studies: No results found.      Scheduled Meds:  ascorbic acid   250 mg Oral Daily   aspirin EC  81 mg Oral BID   B-complex with vitamin C  1 tablet Oral Daily   cholecalciferol  1,000 Units Oral Daily   gabapentin  300 mg Oral TID   lactulose  30 g Oral BID   multivitamin with minerals  1 tablet Oral Daily   tamsulosin  0.4 mg Oral Daily   Continuous Infusions:   LOS: 7 days    Time spent: 35 minutes    Barb Merino, MD [facility] Pager [phone]

## 2022-02-10 NOTE — Plan of Care (Signed)
  Problem: Education: Goal: Knowledge of General Education information will improve Description: Including pain rating scale, medication(s)/side effects and non-pharmacologic comfort measures Outcome: Progressing   Problem: Activity: Goal: Risk for activity intolerance will decrease Outcome: Progressing   Problem: Nutrition: Goal: Adequate nutrition will be maintained Outcome: Progressing   

## 2022-02-10 NOTE — Plan of Care (Signed)
Plan of care reviewed and discussed. °

## 2022-02-10 NOTE — Progress Notes (Signed)
Physical Therapy Treatment Patient Details Name: Joseph Hogan MRN: [id number] DOB: 03-10-1960 Today's Date: 02/10/2022   History of Present Illness Pt s/p fall 02/03/22 with L hip pathologic fx and now s/p THR by posterior approach on 02/06/22.    PT Comments    Pt continues very cooperative and with noted improvement in stability with ambulation this am but distance limited by need for bathroom - pt with ongoing bowel issues and states "I will need some time in here".  Recommendations for follow up therapy are one component of a multi-disciplinary discharge planning process, led by the attending physician.  Recommendations may be updated based on patient status, additional functional criteria and insurance authorization.  Follow Up Recommendations  Home health PT     Assistance Recommended at Discharge Frequent or constant Supervision/Assistance  Patient can return home with the following A lot of help with walking and/or transfers;A little help with bathing/dressing/bathroom;Assistance with cooking/housework;Assist for transportation;Help with stairs or ramp for entrance   Equipment Recommendations  Rolling walker (2 wheels);BSC/3in1    Recommendations for Other Services OT consult     Precautions / Restrictions Precautions Precautions: Fall Precaution Comments: No formal THP Restrictions Weight Bearing Restrictions: No LLE Weight Bearing: Weight bearing as tolerated     Mobility  Bed Mobility Overal bed mobility: Needs Assistance Bed Mobility: Supine to Sit     Supine to sit: Min assist     General bed mobility comments: increased time with use of bed rail, cues for sequence and use of R LE assist    Transfers Overall transfer level: Needs assistance Equipment used: Rolling walker (2 wheels) Transfers: Sit to/from Stand Sit to Stand: From elevated surface, Min guard           General transfer comment: cues for LE management and use of UEs to self  assist;    Ambulation/Gait Ambulation/Gait assistance: Min assist Gait Distance (Feet): 16 Feet Assistive device: Rolling walker (2 wheels) Gait Pattern/deviations: Step-to pattern, Decreased step length - right, Decreased step length - left, Shuffle, Trunk flexed Gait velocity: decr     General Gait Details: cues for sequence, posture and position from [employer]             Wheelchair Mobility    Modified Rankin (Stroke Patients Only)       Balance Overall balance assessment: Needs assistance Sitting-balance support: No upper extremity supported, Feet supported Sitting balance-Leahy Scale: Good     Standing balance support: Single extremity supported Standing balance-Leahy Scale: Poor                              Cognition Arousal/Alertness: Awake/alert Behavior During Therapy: WFL for tasks assessed/performed Overall Cognitive Status: Within Functional Limits for tasks assessed                                          Exercises      General Comments        Pertinent Vitals/Pain Pain Assessment Pain Assessment: 0-10 Pain Score: 3  Pain Location: L hip and buttock Pain Descriptors / Indicators: Aching, Sore Pain Intervention(s): Limited activity within patient's tolerance, Monitored during session, Premedicated before session    Home Living  Prior Function            PT Goals (current goals can now be found in the care plan section) Acute Rehab PT Goals Patient Stated Goal: Regain IND PT Goal Formulation: With patient Time For Goal Achievement: 02/21/22 Potential to Achieve Goals: Good Progress towards PT goals: Progressing toward goals    Frequency    7X/week      PT Plan Current plan remains appropriate    Co-evaluation              AM-PAC PT "6 Clicks" Mobility   Outcome Measure  Help needed turning from your back to your side while in a flat bed without  using bedrails?: A Little Help needed moving from lying on your back to sitting on the side of a flat bed without using bedrails?: A Little Help needed moving to and from a bed to a chair (including a wheelchair)?: A Little Help needed standing up from a chair using your arms (e.g., wheelchair or bedside chair)?: A Little Help needed to walk in hospital room?: A Little Help needed climbing 3-5 steps with a railing? : Total 6 Click Score: 16    End of Session Equipment Utilized During Treatment: Gait belt Activity Tolerance: Patient tolerated treatment well Patient left: Other (comment) (bathroom) Nurse Communication: Mobility status PT Visit Diagnosis: Difficulty in walking, not elsewhere classified (R26.2);History of falling (Z91.81)     Time: 3976-7341 PT Time Calculation (min) (ACUTE ONLY): 12 min  Charges:  $Gait Training: 8-22 mins                     [place] Pager [phone] Office [phone]    Joette Schmoker 02/10/2022, 4:30 PM

## 2022-02-11 DIAGNOSIS — S72002A Fracture of unspecified part of neck of left femur, initial encounter for closed fracture: Secondary | ICD-10-CM | POA: Diagnosis not present

## 2022-02-11 DIAGNOSIS — M5416 Radiculopathy, lumbar region: Secondary | ICD-10-CM | POA: Diagnosis not present

## 2022-02-11 DIAGNOSIS — M899 Disorder of bone, unspecified: Secondary | ICD-10-CM | POA: Diagnosis not present

## 2022-02-11 MED ORDER — ASPIRIN 81 MG PO TBEC: 81.0000 mg | DELAYED_RELEASE_TABLET | Freq: Two times a day (BID) | ORAL | 0 refills | Status: AC

## 2022-02-11 MED ORDER — SENNOSIDES-DOCUSATE SODIUM 8.6-50 MG PO TABS
1.0000 | ORAL_TABLET | Freq: Every day | ORAL | Status: DC
  Filled 2022-02-11: qty 1

## 2022-02-11 MED ORDER — METHOCARBAMOL 500 MG PO TABS: 500.0000 mg | ORAL_TABLET | Freq: Four times a day (QID) | ORAL | 0 refills | Status: AC | PRN

## 2022-02-11 MED ORDER — TAMSULOSIN HCL 0.4 MG PO CAPS: 0.4000 mg | ORAL_CAPSULE | Freq: Every day | ORAL | 0 refills | Status: AC

## 2022-02-11 MED ORDER — GABAPENTIN 300 MG PO CAPS: 300.0000 mg | ORAL_CAPSULE | Freq: Three times a day (TID) | ORAL | 0 refills | Status: DC

## 2022-02-11 MED ORDER — SENNOSIDES-DOCUSATE SODIUM 8.6-50 MG PO TABS: 1.0000 | ORAL_TABLET | Freq: Every day | ORAL | 0 refills | Status: AC

## 2022-02-11 MED ORDER — OXYCODONE HCL 10 MG PO TABS: 10.0000 mg | ORAL_TABLET | ORAL | 0 refills | Status: AC | PRN

## 2022-02-11 NOTE — Progress Notes (Signed)
     Subjective: Patient reports pain as mild.  Workign well with PT. Having regular BMs now. Patient is hopeful to go home today or tomorrow.  Objective:   VITALS:   Vitals:   02/10/22 0539 02/10/22 1346 02/10/22 2117 02/11/22 0433  BP: 131/74 133/71 (!) 140/72 (!) 144/58  Pulse: 72 80 80 84  Resp: '13 20 18 16  '$ Temp: 98.6 F (37 C) 99.1 F (37.3 C) (!) 97.4 F (36.3 C) 98 F (36.7 C)  TempSrc: Oral  Oral Oral  SpO2: 95% 90% 99% 99%  Weight:      Height:        Neurovascular intact Sensation intact distally Intact pulses distally Dorsiflexion/Plantar flexion intact Incision: dressing C/D/I Compartment soft   Lab Results  Component Value Date   WBC 11.5 (H) 02/08/2022   HGB 10.3 (L) 02/08/2022   HCT 31.3 (L) 02/08/2022   MCV 85.8 02/08/2022   PLT 409 (H) 02/08/2022   BMET    Component Value Date/Time   NA 138 02/07/2022 0853   K 5.1 02/07/2022 0853   CL 101 02/07/2022 0853   CO2 27 02/07/2022 0853   GLUCOSE 209 (H) 02/07/2022 0853   BUN 31 (H) 02/07/2022 0853   CREATININE 0.98 02/07/2022 0853   CREATININE 0.77 06/04/2016 1259   CALCIUM 8.6 (L) 02/07/2022 0853   GFRNONAA >60 02/07/2022 0853   GFRNONAA >89 04/08/2015 0837      Xray: post op xrays show THA components in good position, no adverse features  Assessment/Plan: 5 Days Post-Op   Principal Problem:   Hip fracture (HCC) Active Problems:   Lumbar radiculopathy   Leukocytosis   Lytic bone lesion of femur  S/p L THA for FN fx Left femoral head and neck sent for pathology  Post op recs: WB: WBAT LLE, No formal hip precautions Abx: ancef Imaging: PACU pelvis Xray Dressing: Aquacell, keep intact until follow up DVT prophylaxis: Aspirin 81BID starting POD1 Follow up: 2 weeks after surgery for a wound check with Dr. Zachery Dakins at [facility].  Address: [address] [place], [place], [place] [address]  Office Phone: [phone]   [name] 02/11/2022, 11:26  AM   Charlies Constable, MD  Contact information:   [phone] 7am-5pm epic message Dr. Zachery Dakins, or call office for patient follow up: [phone] After hours and holidays please check [email or web address] for group call information for [facility]

## 2022-02-11 NOTE — Plan of Care (Signed)
  Problem: Education: Goal: Knowledge of General Education information will improve Description: Including pain rating scale, medication(s)/side effects and non-pharmacologic comfort measures Outcome: Progressing   Problem: Health Behavior/Discharge Planning: Goal: Ability to manage health-related needs will improve Outcome: Progressing   Problem: Clinical Measurements: Goal: Ability to maintain clinical measurements within normal limits will improve Outcome: Progressing Goal: Will remain free from infection Outcome: Progressing Goal: Diagnostic test results will improve Outcome: Progressing Goal: Respiratory complications will improve Outcome: Progressing Goal: Cardiovascular complication will be avoided Outcome: Progressing   Problem: Activity: Goal: Risk for activity intolerance will decrease Outcome: Progressing   Problem: Nutrition: Goal: Adequate nutrition will be maintained Outcome: Progressing   Problem: Coping: Goal: Level of anxiety will decrease Outcome: Progressing   Problem: Pain Managment: Goal: General experience of comfort will improve Outcome: Progressing   Problem: Safety: Goal: Ability to remain free from injury will improve Outcome: Progressing   Problem: Skin Integrity: Goal: Risk for impaired skin integrity will decrease Outcome: Progressing   Problem: Education: Goal: Knowledge of the prescribed therapeutic regimen will improve Outcome: Progressing Goal: Understanding of discharge needs will improve Outcome: Progressing   Problem: Activity: Goal: Ability to avoid complications of mobility impairment will improve Outcome: Progressing Goal: Ability to tolerate increased activity will improve Outcome: Progressing   Problem: Clinical Measurements: Goal: Postoperative complications will be avoided or minimized Outcome: Progressing   Problem: Pain Management: Goal: Pain level will decrease with appropriate interventions Outcome:  Progressing   Problem: Skin Integrity: Goal: Will show signs of wound healing Outcome: Progressing

## 2022-02-11 NOTE — Progress Notes (Signed)
Physical Therapy Treatment Patient Details Name: Joseph Hogan MRN: [id number] DOB: April 22, 1960 Today's Date: 02/11/2022   History of Present Illness Pt s/p fall 02/03/22 with L hip pathologic fx and now s/p THR by posterior approach on 02/06/22.    PT Comments    POD # 5 am session General Comments: AxO x 3 pleasant but slightly "off". Required repeat instructions.  Impaired memory which he appears to be knowledgable so he likes to write everything down. Assisted OOB required increased time.  General bed mobility comments: attempted to teach pt how to use a belt to self assist LE off bed however pt struggled with coordinating and processing the concept. General transfer comment: 75% VC's on proper hand placement and tech as well as safety with turns.  Required repeat instructions.  Also required increased time. General Gait Details: 50% VC's on proper sequencing and proper walker to self distance as well as s afety with turns.  Pt slow/sluggish. Assisted to bathroom.  Left pt in bathroom with instructions to pull call light when finished. Nurse sec also informed.     Recommendations for follow up therapy are one component of a multi-disciplinary discharge planning process, led by the attending physician.  Recommendations may be updated based on patient status, additional functional criteria and insurance authorization.  Follow Up Recommendations  Home health PT     Assistance Recommended at Discharge Frequent or constant Supervision/Assistance  Patient can return home with the following A little help with bathing/dressing/bathroom;Assistance with cooking/housework;Assist for transportation;Help with stairs or ramp for entrance;A little help with walking and/or transfers   Equipment Recommendations  Rolling walker (2 wheels);BSC/3in1    Recommendations for Other Services       Precautions / Restrictions Precautions Precautions: Fall Precaution Comments: No formal  THP Restrictions Weight Bearing Restrictions: No LLE Weight Bearing: Weight bearing as tolerated     Mobility  Bed Mobility Overal bed mobility: Needs Assistance Bed Mobility: Supine to Sit     Supine to sit: Min assist     General bed mobility comments: attempted to teach pt how to use a belt to self assist LE off bed however pt struggled with coordinating and processing the concept.    Transfers Overall transfer level: Needs assistance Equipment used: Rolling walker (2 wheels) Transfers: Sit to/from Stand Sit to Stand: From elevated surface, Min guard, Min assist           General transfer comment: 75% VC's on proper hand placement and tech as well as safety with turns.  Required repeat instructions.  Also required increased time.    Ambulation/Gait Ambulation/Gait assistance: Min assist, Min guard Gait Distance (Feet): 52 Feet Assistive device: Rolling walker (2 wheels) Gait Pattern/deviations: Step-to pattern, Decreased step length - right, Decreased step length - left, Shuffle, Trunk flexed Gait velocity: decr     General Gait Details: 50% VC's on proper sequencing and proper walker to self distance as well as s afety with turns.  Pt slow/sluggish.   Stairs             Wheelchair Mobility    Modified Rankin (Stroke Patients Only)       Balance                                            Cognition Arousal/Alertness: Awake/alert Behavior During Therapy: WFL for tasks assessed/performed Overall Cognitive Status: Within  Functional Limits for tasks assessed                                 General Comments: AxO x 3 pleasant but slightly "off". Required repeat instructions.  Impaired memory which he appears to be knowledgable so he likes to write everything down.        Exercises      General Comments        Pertinent Vitals/Pain Pain Assessment Pain Assessment: 0-10 Pain Score: 7  Pain Location: L hip and  buttock Pain Descriptors / Indicators: Aching, Sore, Operative site guarding, Discomfort, Grimacing Pain Intervention(s): Monitored during session, Premedicated before session, Repositioned, Ice applied    Home Living                          Prior Function            PT Goals (current goals can now be found in the care plan section) Progress towards PT goals: Progressing toward goals    Frequency    7X/week      PT Plan Current plan remains appropriate    Co-evaluation              AM-PAC PT "6 Clicks" Mobility   Outcome Measure  Help needed turning from your back to your side while in a flat bed without using bedrails?: A Little Help needed moving from lying on your back to sitting on the side of a flat bed without using bedrails?: A Little Help needed moving to and from a bed to a chair (including a wheelchair)?: A Little Help needed standing up from a chair using your arms (e.g., wheelchair or bedside chair)?: A Little Help needed to walk in hospital room?: A Little Help needed climbing 3-5 steps with a railing? : A Lot 6 Click Score: 17    End of Session Equipment Utilized During Treatment: Gait belt Activity Tolerance: Patient limited by fatigue Patient left: Other (comment) (bathroom with instructions to pull call light/also notified NT) Nurse Communication: Mobility status PT Visit Diagnosis: Difficulty in walking, not elsewhere classified (R26.2);History of falling (Z91.81)     Time: 2458-0998 PT Time Calculation (min) (ACUTE ONLY): 32 min  Charges:  $Gait Training: 8-22 mins $Therapeutic Activity: 8-22 mins                    Rica Koyanagi  PTA Acute  Rehabilitation Services Office M-F          [phone] Weekend pager [phone]

## 2022-02-11 NOTE — Progress Notes (Signed)
Physical Therapy Treatment Patient Details Name: Joseph Hogan MRN: [id number] DOB: March 09, 1960 Today's Date: 02/11/2022   History of Present Illness Pt s/p fall 02/03/22 with L hip pathologic fx and now s/p THR by posterior approach on 02/06/22.    PT Comments    POD # 5 pm session Pt in recliner just "got back from the bathroom".  So performed/Educated on HEP.   Pt plans to D/C tomorrow.  "They are delivering a recliner chair to my house today" and "my family will be better prepared to take me home tomorrow".  Pt asking about a BSC. Will sent a secure chat to [facility].  A ramp has been installed at his house, stated pt.     Recommendations for follow up therapy are one component of a multi-disciplinary discharge planning process, led by the attending physician.  Recommendations may be updated based on patient status, additional functional criteria and insurance authorization.  Follow Up Recommendations  Home health PT     Assistance Recommended at Discharge Frequent or constant Supervision/Assistance  Patient can return home with the following A little help with bathing/dressing/bathroom;Assistance with cooking/housework;Assist for transportation;Help with stairs or ramp for entrance;A little help with walking and/or transfers   Equipment Recommendations  Rolling walker (2 wheels);BSC/3in1    Recommendations for Other Services       Precautions / Restrictions Precautions Precautions: Fall Precaution Comments: No formal THP Restrictions Weight Bearing Restrictions: No LLE Weight Bearing: Weight bearing as tolerated     Mobility        Balance                                            Cognition Arousal/Alertness: Awake/alert Behavior During Therapy: WFL for tasks assessed/performed Overall Cognitive Status: Within Functional Limits for tasks assessed                                 General Comments: AxO x 3 pleasant but slightly "off".  Required repeat instructions.  Impaired memory which he appears to be knowledgable so he likes to write everything down.        Exercises  20 reps AP 10 reps knee presses 10 reps HS 10 reps ABD/ADd 10 reps LAQ's  Verbally instructed on standing TE's following handout    General Comments        Pertinent Vitals/Pain Pain Assessment Pain Assessment: 0-10 Pain Score: 7  Pain Location: L hip and buttock Pain Descriptors / Indicators: Aching, Sore, Operative site guarding, Discomfort, Grimacing Pain Intervention(s): Monitored during session, Premedicated before session, Repositioned, Ice applied    Home Living                          Prior Function            PT Goals (current goals can now be found in the care plan section) Progress towards PT goals: Progressing toward goals    Frequency    7X/week      PT Plan Current plan remains appropriate    Co-evaluation              AM-PAC PT "6 Clicks" Mobility   Outcome Measure  Help needed turning from your back to your side while in a flat bed without using bedrails?: A  Little Help needed moving from lying on your back to sitting on the side of a flat bed without using bedrails?: A Little Help needed moving to and from a bed to a chair (including a wheelchair)?: A Little Help needed standing up from a chair using your arms (e.g., wheelchair or bedside chair)?: A Little Help needed to walk in hospital room?: A Little Help needed climbing 3-5 steps with a railing? : A Lot 6 Click Score: 17    End of Session Equipment Utilized During Treatment: Gait belt Activity Tolerance: Patient limited by fatigue Patient left: in chair;with call bell/phone within reach;with chair alarm set Nurse Communication: Mobility status PT Visit Diagnosis: Difficulty in walking, not elsewhere classified (R26.2);History of falling (Z91.81)     Time: 0102-7253 PT Time Calculation (min) (ACUTE ONLY): 19 min  Charges:    $Therapeutic Exercise: 8-22 mins                     {Zariel Capano  PTA Acute  Rehabilitation Services Office M-F          [phone] Weekend pager [phone]

## 2022-02-11 NOTE — Discharge Summary (Signed)
Physician Discharge Summary  Joseph Hogan CHE:[id number] DOB: 12-Mar-1960 DOA: 02/03/2022  PCP: Leonie Douglas, MD  Admit date: 02/03/2022 Discharge date: 02/11/2022  Admitted From: Home Disposition: Home with home health therapies  Recommendations for Outpatient Follow-up:  Follow up with PCP in 1-2 weeks Orthopedic to schedule follow-up Oncology to schedule follow-up with biopsy results  Home Health: PT/OT Equipment/Devices: Walker, bedside commode,  Discharge Condition: Stable CODE STATUS: Full code Diet recommendation: Regular diet, nutritional supplements  Discharge summary: 62 year old with history of hypertension, prediabetes admitted with mechanical fall and left hip fracture.  He does walk with a walker secondary to recently worsening lumbar radicular pain and he was seeing chiropractor for same.  Underwent left hip arthroplasty 12/27.  CT scan was consistent with underlying lytic lesions in the femoral head and neck.  Bone biopsy is pending.  Postoperatively improving, however with pelvic pain and difficulty mobility so remained in the hospital before transitioning home. Currently stabilized.  Adequate support system at home.  Closed traumatic pathological fracture of the left femoral neck: Left total hip 12/27, Dr. Zachery Dakins Weightbearing as tolerated DVT prophylaxis with aspirin 81 mg twice daily for 4 weeks Adequate pain medications, and oral opiates and prescribed.  Also should use a stool softener. Continue to work with PT OT to improve mobility and anticipate home with home health PT OT.  Rolling walker.   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 Serological workup in progress.  Flow cytometry without M spike. Femoral neck biopsy pending. Followed by Dr. [name].  Should have outpatient follow-up.   Lumbar radiculopathy: On gabapentin.  Added.  Fall precautions.  Stable for discharge with family support system.   Discharge Diagnoses:   Principal Problem:   Hip fracture [facility]) Active Problems:   Lumbar radiculopathy   Leukocytosis   Lytic bone lesion of femur    Discharge Instructions  Discharge Instructions     Diet general   Complete by: As directed    Increase activity slowly   Complete by: As directed       Allergies as of 02/11/2022       Reactions   Codeine Nausea And Vomiting   Lisinopril Cough        Medication List     TAKE these medications    aspirin EC 81 MG tablet Take 1 tablet (81 mg total) by mouth 2 (two) times daily for 28 days. Swallow whole.   B-complex with vitamin C tablet Take 1 tablet by mouth daily.   cholecalciferol 1000 units tablet Commonly known as: VITAMIN D Take 1,000 Units by mouth daily.   gabapentin 300 MG capsule Commonly known as: NEURONTIN Take 1 capsule (300 mg total) by mouth 3 (three) times daily.   ibuprofen 400 MG tablet Commonly known as: ADVIL Take 600 mg by mouth every 6 (six) hours as needed for mild pain.   methocarbamol 500 MG tablet Commonly known as: ROBAXIN Take 1 tablet (500 mg total) by mouth every 6 (six) hours as needed for muscle spasms.   multivitamin with minerals Tabs tablet Take 1 tablet by mouth daily.   Oxycodone HCl 10 MG Tabs Take 1 tablet (10 mg total) by mouth every 4 (four) hours as needed for up to 5 days for moderate pain or breakthrough pain (5m for mild pain, 150mfor moderate pain, 1538mor severe pain).   senna-docusate 8.6-50 MG tablet Commonly known as: Senokot-S Take 1 tablet by mouth at bedtime.   tamsulosin 0.4 MG Caps capsule Commonly  known as: FLOMAX Take 1 capsule (0.4 mg total) by mouth daily.   vitamin A 25000 UNIT capsule Take 25,000 Units by mouth daily.   vitamin C 100 MG tablet Take 100 mg by mouth daily.   vitamin E 180 MG (400 UNITS) capsule Take 400 Units by mouth daily.   zinc gluconate 50 MG tablet Take 50 mg by mouth daily.               Durable Medical Equipment   (From admission, onward)           Start     Ordered   02/08/22 1136  For home use only DME Walker youth  Once       Question:  Patient needs a walker to treat with the following condition  Answer:  Hip fracture ([place])   02/08/22 1136            Follow-up Information     [facility].   Specialty: Home Health Services Why: Alvis Lemmings will provide PT in the home after discharge. Contact information: [address] [place] [address] [phone]                Allergies  Allergen Reactions   Codeine Nausea And Vomiting   Lisinopril Cough    Consultations: Oncology Orthopedics   Procedures/Studies: DG HIP UNILAT W OR W/O PELVIS 2-3 VIEWS LEFT  Result Date: 02/06/2022 CLINICAL DATA:  Status post total left hip arthroplasty EXAM: DG HIP (WITH OR WITHOUT PELVIS) 2-3V LEFT COMPARISON:  02/03/2022 FINDINGS: Postoperative change of left THA. No radiographic evidence of loosening. No acute abnormality. IMPRESSION: Expected postoperative change of left THA. Electronically Signed   By: Placido Sou M.D.   On: 02/06/2022 20:14   DG Pelvis Portable  Result Date: 02/06/2022 CLINICAL DATA:  Left hip arthroplasty EXAM: PORTABLE PELVIS 1-2 VIEWS COMPARISON:  02/05/2022 FINDINGS: There is interval left hip arthroplasty. No fracture is seen. In the previous study, comminuted fracture was seen in the neck of left femur. Cortical margins of left pubic bone appear less distinct. A lytic lesion was seen in the same region in the previous CT. IMPRESSION: Status post left hip arthroplasty. Electronically Signed   By: Elmer Picker M.D.   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 * Tracking Code: BO * EXAM: CT CHEST, ABDOMEN, AND PELVIS WITH CONTRAST TECHNIQUE:  Multidetector CT imaging of the chest, abdomen and pelvis was performed following the standard protocol during bolus administration of intravenous contrast. RADIATION DOSE REDUCTION: This exam was performed according to the departmental dose-optimization program which includes automated exposure control, adjustment of the mA and/or kV according to patient size and/or use of iterative reconstruction technique. CONTRAST:  19m OMNIPAQUE IOHEXOL 350 MG/ML SOLN COMPARISON:  CT left hip 02/04/2022 FINDINGS: CT CHEST FINDINGS Cardiovascular: Unremarkable Mediastinum/Nodes: Left supraclavicular node 1.2 cm in short axis, image 3 series 4. Small type 1 hiatal hernia. Lower thoracic periaortic lymph node 0.8 cm in short axis on image 51 series 4. Lungs/Pleura: Bandlike atelectasis especially dependently in both lower lobes and to a lesser degree in the lingula and posteriorly in the right upper lobe. No well-defined pulmonary nodules. Musculoskeletal: Pathologic fracture anteriorly in the fourth rib, image 52 series 6. Deformity anteriorly in the right second rib probably from early  pathologic fracture, image 33 series 6. Marrow heterogeneity in the third rib anteriorly on image 44 series 6 without visible fracture. Thoracic spondylosis. Small lucency inferiorly in the T7 vertebral body, image 80 series 8, probably a Schmorl's node. CT ABDOMEN PELVIS FINDINGS Hepatobiliary: No well-defined focal hepatic lesion identified. Gallbladder unremarkable. No biliary dilatation. Pancreas: Unremarkable Spleen: Unremarkable Adrenals/Urinary Tract: No adrenal mass identified. Hypodense right renal lesions favor cysts and measure below 20 Hounsfield units. No further imaging workup of these lesions is indicated. Urinary bladder unremarkable. 1.1 cm exophytic lesion from the left mid kidney posterolaterally internal density 34 Hounsfield units on image 89 series 7, nonspecific for complex cyst versus tumor. Stomach/Bowel: Wall thickening  in the proximal stomach body is likely secondary to nondistention. Wall thickening in the rectum as on image 114 series 4 nonspecific and could be inflammatory, with tumor is a less likely differential diagnostic consideration given the fairly long segment of involvement measuring about 10 cm in length. Sigmoid colon diverticulosis. Normal appendix. Redundant transverse colon. Vascular/Lymphatic: Pathologic retroperitoneal adenopathy observed, index periaortic lymph node 1.9 cm in short axis on image 70 series 4. Right common iliac node 1.4 cm in short axis, image 89 series 4. Right external iliac adenopathy noted with index lymph node 1.6 cm in short axis, image 107 series 4. Left external iliac lymph node 1.3 cm in short axis, image 105 series 4. Reproductive: Unremarkable Other: Substantial presacral edema noted, with some trace fluid in lower paracolic gutters bilaterally. Perirectal stranding noted with multiple small perirectal lymph nodes including a 0.8 cm node on image 102 series 4. Musculoskeletal: As noted on dedicated exams, lytic destructive lesions of the left superior and inferior pubic ramus and pubic body are noted along with a pathologic fracture through the left femoral neck 1, and adjacent edema along fascia planes. Left hip joint effusion. Thickening/edema along the left obturator internus muscle. Edema tracks along the left hip adductor musculature. Lumbar spondylosis and degenerative disc disease causing generally mild multilevel impingement. IMPRESSION: 1. Pathologic retroperitoneal and bilateral pelvic adenopathy, with lytic destructive lesions of the left superior and inferior pubic ramus and pubic body as well as a pathologic fracture through the left femoral neck. Pathologic fracture anteriorly in the right second rib. Deformity anteriorly in the right third rib probably from early pathologic fracture. Metastatic disease or myeloma strongly favored over multifocal osteomyelitis as a  cause. 2. Mildly enlarged left supraclavicular lymph node and lower thoracic periaortic lymph node. Pathologic retroperitoneal and pelvic adenopathy. 3. 10 cm long segment of rectal wall thickening, possibly inflammatory although rectal tumor is a differential diagnostic consideration. 4. 1.1 cm exophytic lesion from the left mid kidney posterolaterally, nonspecific for complex cyst versus tumor. This could be further assessed with dedicated renal protocol MRI with and without contrast if clinically warranted, although such a small lesion is highly unlikely to be the cause of the adenopathy and bony lesions. 5. Bandlike atelectasis in both lungs. 6. Small type 1 hiatal hernia. 7. Lumbar spondylosis and degenerative disc disease causing generally mild multilevel impingement. 8. Sigmoid colon diverticulosis. 9. Wall thickening in the proximal stomach body, probably secondary to nondistention. Electronically Signed   By: Van Clines M.D.   On: 02/05/2022 18:36   CT FEMUR LEFT WO CONTRAST  Result Date: 02/05/2022 CLINICAL DATA:  Metastatic disease to the skeleton and left hip EXAM: CT OF THE LOWER LEFT EXTREMITY WITHOUT CONTRAST TECHNIQUE: Multidetector CT imaging of the lower left extremity was performed according to the standard protocol.  RADIATION DOSE REDUCTION: This exam was performed according to the departmental dose-optimization program which includes automated exposure control, adjustment of the mA and/or kV according to patient size and/or use of iterative reconstruction technique. COMPARISON:  Left hip CT 02/04/2022 FINDINGS: Bones/Joint/Cartilage Lytic metastatic involvement in the left superior pubic ramus, left pubic body, and extending into the anterior aspect of the right inferior pubic ramus. Cortical destruction along the medial margin of the left pubis adjacent to the pubic symphysis on image 50 of series 3, along with a pathologic fracture of the superior pubic ramus in the vicinity of  the lesion Intra-articular left femoral neck fracture associated with some indistinct lucency in the femoral neck compatible with a pathologic fracture. Varus angulation noted along with about 0.6 cm medial displacement of the femoral head fragment with respect to the distal fragment. Prominent osteoarthritis of the knee. Extensive tricompartmental spurring and loss of articular space. 8 mm free osteochondral fragment posteriorly in the left knee joint. Moderate degenerative chondral thinning in the left hip joint. No obvious lytic destructive findings in the left femur distal to the femoral neck. Ligaments Suboptimally assessed by CT. Muscles and Tendons Mild edema noted along fascia planes in the vicinity of the femoral neck fracture. Otherwise unremarkable. Soft tissues Presacral edema is partially included. Superficial femoral artery atherosclerotic vascular calcification is present. IMPRESSION: 1. 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 There is a pathologic fracture of the left superior pubic ramus adjacent to the pubic symphysis. 2. Pathologic intra-articular fracture of the left femoral neck with about 0.6 cm of medial displacement of the femoral head fragment with respect to the distal fragment. 3. 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 4. 8 mm free osteochondral fragment posteriorly in the left knee joint. 5. SFA atherosclerotic vascular calcification. 6. Presacral edema is partially included. Electronically Signed   By: Van Clines M.D.   On: 02/05/2022 18:16   CT HIP LEFT WO CONTRAST  Result Date: 02/04/2022 CLINICAL DATA:  Hip trauma, fracture suspected, xray done EXAM: CT OF THE LEFT HIP WITHOUT CONTRAST TECHNIQUE: Multidetector CT imaging of the left hip was performed according to the standard protocol. Multiplanar CT image reconstructions were also generated. RADIATION DOSE  REDUCTION: This exam was performed according to the departmental dose-optimization program which includes automated exposure control, adjustment of the mA and/or kV according to patient size and/or use of iterative reconstruction technique. COMPARISON:  Hip radiograph 02/03/2022 FINDINGS: Bones/Joint/Cartilage There is an acute displaced subcapital left femoral neck fracture with 9 mm displacement and angulation. There are underlying lucencies at the fracture site and cortical thinning of the anterior posterior aspect of the femoral head. Additionally, there is a lytic destructive lesion of the left parasymphyseal pubic bone with pathologic fracture of the superior pubic ramus. Lytic lesion appears to extend throughout the superior pubic ramus to the puboacetabular junction (series 5, image 39). Possible small lesion in the left inferior pubic ramus (series 5, image 64) and in the posterior acetabulum (series 5, image 28). Ligaments Suboptimally assessed by CT. Muscles and Tendons There is hypoattenuation in the obturator internus muscle, pelvic component, the more anterior of which may be reflective of tumor involvement or posttraumatic sequelae. Soft tissues Left hip joint effusion. Soft tissue swelling along the hip. No focal fluid collection. Sigmoid diverticulosis noted. Prominent left external iliac lymph node measuring 1.2 cm (series 5, image 12). Other external iliac lymph node measures  1.2 cm (series 5, image 15). IMPRESSION: Acute pathologic fracture of the left femoral neck with displacement and angulation, with underlying lytic lesions in the femoral head and neck. Additional lytic destructive lesion of the left parasymphyseal pubic bone extending throughout the superior pubic ramus to the puboacetabular junction, with pathologic fracture of the superior pubic ramus. Adjacent hypoattenuation in the pelvic component of the obturator internus muscle, suspicious for tumor involvement. Probable small lytic  lesion in the inferior pubic ramus. Prominent left external iliac lymph nodes measuring up to 1.2 cm. Recommend malignancy workup including SPEP/UPEP and CT of the chest, abdomen, and pelvis. These results were called by telephone at the time of interpretation on 02/04/2022 at 10:49 am to provider Mechele Claude, PA-C, who verbally acknowledged these results. Electronically Signed   By: Maurine Simmering M.D.   On: 02/04/2022 10:56   DG Hip Unilat W or Wo Pelvis 2-3 Views Left  Result Date: 02/03/2022 CLINICAL DATA:  Left hip pain after fall EXAM: DG HIP (WITH OR WITHOUT PELVIS) 2-3V LEFT COMPARISON:  12/22/2021 FINDINGS: Acute displaced subcapital left femoral neck fracture. The distal femoral shaft is displaced superiorly and laterally. The left femoral head remains seated within the left acetabulum. IMPRESSION: Acute subcapital left femoral neck fracture. Electronically Signed   By: Placido Sou M.D.   On: 02/03/2022 17:31   (Echo, Carotid, EGD, Colonoscopy, ERCP)    Subjective: Patient seen in the morning rounds.  Pain is controlled.  Still has some difficulty walking independent but can use her walker and walk.  Had good bowel movements after lactulose and he thinks now he can go on his own.   Discharge Exam: Vitals:   02/10/22 2117 02/11/22 0433  BP: (!) 140/72 (!) 144/58  Pulse: 80 84  Resp: 18 16  Temp: (!) 97.4 F (36.3 C) 98 F (36.7 C)  SpO2: 99% 99%   Vitals:   02/10/22 0539 02/10/22 1346 02/10/22 2117 02/11/22 0433  BP: 131/74 133/71 (!) 140/72 (!) 144/58  Pulse: 72 80 80 84  Resp: _0 Temp: 98.6 F (37 C) 99.1 F (37.3 C) (!) 97.4 F (36.3 C) 98 F (36.7 C)  TempSrc: Oral  Oral Oral  SpO2: 95% 90% 99% 99%  Weight:      Height:        General: Pt is alert, awake, not in acute distress Cardiovascular: RRR, S1/S2 +, no rubs, no gallops Respiratory: CTA bilaterally, no wheezing, no rhonchi Abdominal: Soft, NT, ND, bowel sounds + Extremities: no edema, no  cyanosis Thigh incisions clean and dry.    The results of significant diagnostics from this hospitalization (including imaging, microbiology, ancillary and laboratory) are listed below for reference.     Microbiology: Recent Results (from the past 240 hour(s))  MRSA Next Gen by PCR, Nasal     Status: None   Collection Time: 02/05/22 10:42 PM   Specimen: Nasal Mucosa; Nasal Swab  Result Value Ref Range Status   MRSA by PCR Next Gen NOT DETECTED NOT DETECTED Final    Comment: (NOTE) The GeneXpert MRSA Assay (FDA approved for NASAL specimens only), is one component of a comprehensive MRSA colonization surveillance program. It is not intended to diagnose MRSA infection nor to guide or monitor treatment for MRSA infections. Test performance is not FDA approved in patients less than 11 years old. Performed at [facility], [place] [address], [place], [place] [address]      Labs: BNP (last 3 results)  No results for input(s): "BNP" in the last 8760 hours. Basic Metabolic Panel: Recent Labs  Lab 02/06/22 0311 02/07/22 0853  NA 135 138  K 4.2 5.1  CL 100 101  CO2 25 27  GLUCOSE 131* 209*  BUN 22 31*  CREATININE 1.07 0.98  CALCIUM 8.8* 8.6*  MG 1.9  --   PHOS 5.3*  --    Liver Function Tests: Recent Labs  Lab 02/06/22 0311  ALBUMIN 2.9*   No results for input(s): "LIPASE", "AMYLASE" in the last 168 hours. No results for input(s): "AMMONIA" in the last 168 hours. CBC: Recent Labs  Lab 02/06/22 0311 02/07/22 0853 02/08/22 0408  WBC 11.2* 12.3* 11.5*  NEUTROABS 8.7*  --   --   HGB 12.9* 11.5* 10.3*  HCT 38.8* 35.0* 31.3*  MCV 85.1 86.4 85.8  PLT 372 427* 409*   Cardiac Enzymes: No results for input(s): "CKTOTAL", "CKMB", "CKMBINDEX", "TROPONINI" in the last 168 hours. BNP: Invalid input(s): "POCBNP" CBG: No results for input(s): "GLUCAP" in the last 168 hours. D-Dimer No results for input(s): "DDIMER" in the last 72 hours. Hgb A1c No results for  input(s): "HGBA1C" in the last 72 hours. Lipid Profile No results for input(s): "CHOL", "HDL", "LDLCALC", "TRIG", "CHOLHDL", "LDLDIRECT" in the last 72 hours. Thyroid function studies No results for input(s): "TSH", "T4TOTAL", "T3FREE", "THYROIDAB" in the last 72 hours.  Invalid input(s): "FREET3" Anemia work up No results for input(s): "VITAMINB12", "FOLATE", "FERRITIN", "TIBC", "IRON", "RETICCTPCT" in the last 72 hours. Urinalysis    Component Value Date/Time   BILIRUBINUR SMALL 02/13/2016 1437   PROTEINUR 30 02/13/2016 1437   UROBILINOGEN 0.2 02/13/2016 1437   NITRITE NEG 02/13/2016 1437   LEUKOCYTESUR Negative 02/13/2016 1437   Sepsis Labs Recent Labs  Lab 02/06/22 0311 02/07/22 0853 02/08/22 0408  WBC 11.2* 12.3* 11.5*   Microbiology Recent Results (from the past 240 hour(s))  MRSA Next Gen by PCR, Nasal     Status: None   Collection Time: 02/05/22 10:42 PM   Specimen: Nasal Mucosa; Nasal Swab  Result Value Ref Range Status   MRSA by PCR Next Gen NOT DETECTED NOT DETECTED Final    Comment: (NOTE) The GeneXpert MRSA Assay (FDA approved for NASAL specimens only), is one component of a comprehensive MRSA colonization surveillance program. It is not intended to diagnose MRSA infection nor to guide or monitor treatment for MRSA infections. Test performance is not FDA approved in patients less than 48 years old. Performed at [facility], [place] [address] [place], [place] [address]      Time coordinating discharge: 35 minutes  SIGNED:   Barb Merino, MD  [facility] 02/11/2022, 8:14 AM

## 2022-02-12 NOTE — Care Management (Signed)
Could not go home yesterday due to inadequate care taker support at home . Able to discharge today

## 2022-02-12 NOTE — TOC Progression Note (Signed)
Transition of Care [facility]) - Progression Note   Patient Details  Name: Joseph Hogan MRN: [id number] Date of Birth: 1961/01/08  Transition of [facility]) CM/SW [place], LCSW Phone Number: 02/12/2022, 10:54 AM  Clinical Narrative: [facility] consulted for a 3N1. Patient's insurance will not cover the 3N1 as patient does not meet criteria (e.g. limited to 1 room, cannot ambulate to bathroom), so the private pay cost is $78.56 through [employer]. Patient prefers to private pay for one in the community. TOC signing off.  Barriers to Discharge: Barriers Resolved  Expected Discharge Plan and Services Expected Discharge Date: 02/12/22               DME Arranged: Gilford Rile rolling DME Agency: [employer] Date DME Agency Contacted: 02/08/22 Time DME Agency Contacted: [phone] Representative spoke with at DME Agency: Brenton Grills HH Arranged: PT [place]: [place] Date [place]: 02/08/22 Time [place]: 1042 Representative spoke with at [place]: Central Determinants of Health ([place]) Interventions [place]: No Food Insecurity (02/04/2022)  Housing: Low Risk  (02/04/2022)  Transportation Needs: No Transportation Needs (02/04/2022)  Utilities: Not At Risk (02/04/2022)  Tobacco Use: Low Risk  (02/08/2022)   Readmission Risk Interventions     No data to display

## 2022-02-12 NOTE — Progress Notes (Signed)
Physical Therapy Treatment Patient Details Name: Joseph Hogan MRN: [id number] DOB: January 07, 1961 Today's Date: 02/12/2022   History of Present Illness Pt s/p fall 02/03/22 with L hip pathologic fx and now s/p THR by posterior approach on 02/06/22.    PT Comments    POD #6 General Comments: AxO x 3 pleasant eager to get home today. Pt decided to purchase a BSC on his own due to cost. Assisted OOB to amb in hallway went well.  General bed mobility comments: using his belt, pt was self able to hook and slide his L LE off bed with increased time.General transfer comment: 25% VC's on proper hand placement to avoid pulling up on walker.  Self able. General Gait Details: 25% VC's on proper sequencing and safety with turns.  Tolerated a functional distance.  decreased speed but safe. Then returned to room to perform some TE's following HEP handout.  Instructed on proper tech, freq as well as use of ICE.   Addressed all mobility questions, discussed appropriate activity, educated on use of ICE.  Pt ready for D/C to home.   Recommendations for follow up therapy are one component of a multi-disciplinary discharge planning process, led by the attending physician.  Recommendations may be updated based on patient status, additional functional criteria and insurance authorization.  Follow Up Recommendations  Follow physician's recommendations for discharge plan and follow up therapies     Assistance Recommended at Discharge Frequent or constant Supervision/Assistance  Patient can return home with the following A little help with bathing/dressing/bathroom;Assistance with cooking/housework;Assist for transportation;Help with stairs or ramp for entrance;A little help with walking and/or transfers   Equipment Recommendations  Rolling walker (2 wheels)    Recommendations for Other Services       Precautions / Restrictions Precautions Precautions: Fall Precaution Comments: No formal  THP Restrictions Weight Bearing Restrictions: No LLE Weight Bearing: Weight bearing as tolerated     Mobility  Bed Mobility Overal bed mobility: Needs Assistance Bed Mobility: Supine to Sit     Supine to sit: Supervision, Min guard     General bed mobility comments: using his belt, pt was self able to hook and slide his L LE off bed with increased time.    Transfers Overall transfer level: Needs assistance Equipment used: Rolling walker (2 wheels) Transfers: Sit to/from Stand Sit to Stand: Supervision, From elevated surface   Step pivot transfers: Supervision       General transfer comment: 25% VC's on proper hand placement to avoid pulling up on walker.  Self able.    Ambulation/Gait Ambulation/Gait assistance: Supervision Gait Distance (Feet): 65 Feet Assistive device: Rolling walker (2 wheels) Gait Pattern/deviations: Step-to pattern, Decreased step length - right, Decreased step length - left, Shuffle, Trunk flexed Gait velocity: decr     General Gait Details: 25% VC's on proper sequencing and safety with turns.  Tolerated a functional distance.  decreased speed but safe.   Stairs Stairs:  (has a newly built RAMP)           Engineer, building services Rankin (Stroke Patients Only)       Balance                                            Cognition Arousal/Alertness: Awake/alert Behavior During Therapy: WFL for tasks assessed/performed Overall Cognitive Status: Within Functional Limits for tasks assessed  General Comments: AxO x 3 pleasant eager to get home today. Pt decided to purchase a BSC on his own due to cost.        Exercises  Total Hip Replacement TE's following HEP Handout 10 reps ankle pumps 05 reps knee presses 05 reps heel slides 05 reps SAQ's 05 reps ABD Instructed how to use a belt loop to assist  Followed by ICE     General Comments         Pertinent Vitals/Pain Pain Assessment Pain Assessment: 0-10 Pain Score: 7  Pain Location: L hip Pain Descriptors / Indicators: Aching, Sore, Operative site guarding, Discomfort, Grimacing Pain Intervention(s): Monitored during session, Premedicated before session, Repositioned, Ice applied    Home Living                          Prior Function            PT Goals (current goals can now be found in the care plan section) Progress towards PT goals: Progressing toward goals    Frequency    7X/week      PT Plan Current plan remains appropriate    Co-evaluation              AM-PAC PT "6 Clicks" Mobility   Outcome Measure  Help needed turning from your back to your side while in a flat bed without using bedrails?: None Help needed moving from lying on your back to sitting on the side of a flat bed without using bedrails?: None Help needed moving to and from a bed to a chair (including a wheelchair)?: None Help needed standing up from a chair using your arms (e.g., wheelchair or bedside chair)?: None Help needed to walk in hospital room?: A Little Help needed climbing 3-5 steps with a railing? : A Little 6 Click Score: 22    End of Session Equipment Utilized During Treatment: Gait belt Activity Tolerance: Patient tolerated treatment well Patient left: in chair;with call bell/phone within reach;with chair alarm set Nurse Communication: Mobility status PT Visit Diagnosis: Difficulty in walking, not elsewhere classified (R26.2);History of falling (Z91.81)     Time: 1937-9024 PT Time Calculation (min) (ACUTE ONLY): 28 min  Charges:  $Gait Training: 8-22 mins $Therapeutic Exercise: 8-22 mins                     Rica Koyanagi  PTA [place] Office M-F          [phone] Weekend pager [phone]

## 2022-02-12 NOTE — Plan of Care (Signed)
Plan of care reviewed and discussed. °

## 2022-02-12 NOTE — Plan of Care (Signed)
Pt ready to DC home with family as soon as they arrive.

## 2022-02-13 LAB — MULTIPLE MYELOMA PANEL, SERUM
Albumin SerPl Elph-Mcnc: 3.1 g/dL (ref 2.9–4.4)
Albumin/Glob SerPl: 0.9 (ref 0.7–1.7)
Alpha 1: 0.5 g/dL — ABNORMAL HIGH (ref 0.0–0.4)
Alpha2 Glob SerPl Elph-Mcnc: 1.1 g/dL — ABNORMAL HIGH (ref 0.4–1.0)
B-Globulin SerPl Elph-Mcnc: 0.8 g/dL (ref 0.7–1.3)
Gamma Glob SerPl Elph-Mcnc: 1.1 g/dL (ref 0.4–1.8)
Globulin, Total: 3.5 g/dL (ref 2.2–3.9)
IgA: 227 mg/dL (ref 61–437)
IgG (Immunoglobin G), Serum: 1049 mg/dL (ref 603–1613)
IgM (Immunoglobulin M), Srm: 47 mg/dL (ref 20–172)
Total Protein ELP: 6.6 g/dL (ref 6.0–8.5)

## 2022-02-15 DIAGNOSIS — M5116 Intervertebral disc disorders with radiculopathy, lumbar region: Secondary | ICD-10-CM | POA: Diagnosis not present

## 2022-02-15 DIAGNOSIS — I10 Essential (primary) hypertension: Secondary | ICD-10-CM | POA: Diagnosis not present

## 2022-02-15 DIAGNOSIS — R7303 Prediabetes: Secondary | ICD-10-CM | POA: Diagnosis not present

## 2022-02-15 DIAGNOSIS — K5730 Diverticulosis of large intestine without perforation or abscess without bleeding: Secondary | ICD-10-CM | POA: Diagnosis not present

## 2022-02-15 DIAGNOSIS — K449 Diaphragmatic hernia without obstruction or gangrene: Secondary | ICD-10-CM | POA: Diagnosis not present

## 2022-02-15 DIAGNOSIS — D72829 Elevated white blood cell count, unspecified: Secondary | ICD-10-CM | POA: Diagnosis not present

## 2022-02-15 DIAGNOSIS — M4726 Other spondylosis with radiculopathy, lumbar region: Secondary | ICD-10-CM | POA: Diagnosis not present

## 2022-02-15 DIAGNOSIS — S72012D Unspecified intracapsular fracture of left femur, subsequent encounter for closed fracture with routine healing: Secondary | ICD-10-CM | POA: Diagnosis not present

## 2022-02-15 DIAGNOSIS — Z96642 Presence of left artificial hip joint: Secondary | ICD-10-CM | POA: Diagnosis not present

## 2022-02-15 LAB — SURGICAL PATHOLOGY

## 2022-02-18 DIAGNOSIS — R7303 Prediabetes: Secondary | ICD-10-CM | POA: Diagnosis not present

## 2022-02-18 DIAGNOSIS — I10 Essential (primary) hypertension: Secondary | ICD-10-CM | POA: Diagnosis not present

## 2022-02-18 DIAGNOSIS — K449 Diaphragmatic hernia without obstruction or gangrene: Secondary | ICD-10-CM | POA: Diagnosis not present

## 2022-02-18 DIAGNOSIS — S72012D Unspecified intracapsular fracture of left femur, subsequent encounter for closed fracture with routine healing: Secondary | ICD-10-CM | POA: Diagnosis not present

## 2022-02-18 DIAGNOSIS — K5730 Diverticulosis of large intestine without perforation or abscess without bleeding: Secondary | ICD-10-CM | POA: Diagnosis not present

## 2022-02-18 DIAGNOSIS — D72829 Elevated white blood cell count, unspecified: Secondary | ICD-10-CM | POA: Diagnosis not present

## 2022-02-18 DIAGNOSIS — M5116 Intervertebral disc disorders with radiculopathy, lumbar region: Secondary | ICD-10-CM | POA: Diagnosis not present

## 2022-02-18 DIAGNOSIS — Z96642 Presence of left artificial hip joint: Secondary | ICD-10-CM | POA: Diagnosis not present

## 2022-02-18 DIAGNOSIS — M4726 Other spondylosis with radiculopathy, lumbar region: Secondary | ICD-10-CM | POA: Diagnosis not present

## 2022-02-19 ENCOUNTER — Other Ambulatory Visit: Payer: Self-pay | Admitting: Physician Assistant

## 2022-02-19 DIAGNOSIS — M899 Disorder of bone, unspecified: Secondary | ICD-10-CM

## 2022-02-20 ENCOUNTER — Inpatient Hospital Stay: Payer: Medicare HMO | Admitting: Physician Assistant

## 2022-02-20 ENCOUNTER — Telehealth: Payer: Self-pay | Admitting: Physician Assistant

## 2022-02-20 ENCOUNTER — Inpatient Hospital Stay: Payer: Medicare HMO

## 2022-02-20 ENCOUNTER — Telehealth: Payer: Self-pay

## 2022-02-20 ENCOUNTER — Other Ambulatory Visit: Payer: Self-pay

## 2022-02-20 DIAGNOSIS — C7951 Secondary malignant neoplasm of bone: Secondary | ICD-10-CM

## 2022-02-20 MED ORDER — PLENVU 140 G PO SOLR: ORAL | 0 refills | Status: DC

## 2022-02-20 NOTE — Telephone Encounter (Signed)
Patient's daughter called to reschedule today's appt to next week. I called back the daughter since we needed to review biopsy results and diagnostic workup. 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 IHC studies suggest gastrointestinal primary so we will need PET scan and EGD/colonoscopy to confirm primary. Patient expressed understanding and requested me to call her sister, Benjamine Mola, to discuss as well. I left a voicemail for Benjamine Mola to call me back.

## 2022-02-20 NOTE — Telephone Encounter (Signed)
  Received a referral from Oncology. 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 His oncologist reached out to GI. IHC studies suggest gastrointestinal primary so they are asking for an EGD/colonoscopy.   Contacted the daughter of the patient, Joseph Hogan. She is the primary contact for the patient. Agrees to a procedure date of 02/25/22 and arrival time of 12:30 pm. Dr Silverio Decamp will do the EGD and colonoscopy. Discussed options for prep and receiving the instructions. Family is located in [place]. She is familiar with colonoscopy preps. Agrees to Plenvu prep and she will review the instructions through My Chart. Agrees to contact me with any questions or concerns. Patient is taking ASA 81 mg BID since the hip surgery to protect against DVT. No other anticoagulation. No oxygen supplementation, diet pills or allergy to soy. No history of difficult intubation that she is aware of , no history in chart. Recent intubation without incident.

## 2022-02-20 NOTE — Telephone Encounter (Signed)
Called to reschedule patient due to limited mobility. Patient r/s and will be notified.

## 2022-02-21 ENCOUNTER — Encounter: Payer: Self-pay | Admitting: Certified Registered Nurse Anesthetist

## 2022-02-21 DIAGNOSIS — S72002D Fracture of unspecified part of neck of left femur, subsequent encounter for closed fracture with routine healing: Secondary | ICD-10-CM | POA: Diagnosis not present

## 2022-02-22 ENCOUNTER — Telehealth: Payer: Self-pay

## 2022-02-22 DIAGNOSIS — S72012D Unspecified intracapsular fracture of left femur, subsequent encounter for closed fracture with routine healing: Secondary | ICD-10-CM | POA: Diagnosis not present

## 2022-02-22 DIAGNOSIS — D72829 Elevated white blood cell count, unspecified: Secondary | ICD-10-CM | POA: Diagnosis not present

## 2022-02-22 DIAGNOSIS — I10 Essential (primary) hypertension: Secondary | ICD-10-CM | POA: Diagnosis not present

## 2022-02-22 DIAGNOSIS — M4726 Other spondylosis with radiculopathy, lumbar region: Secondary | ICD-10-CM | POA: Diagnosis not present

## 2022-02-22 DIAGNOSIS — K449 Diaphragmatic hernia without obstruction or gangrene: Secondary | ICD-10-CM | POA: Diagnosis not present

## 2022-02-22 DIAGNOSIS — Z96642 Presence of left artificial hip joint: Secondary | ICD-10-CM | POA: Diagnosis not present

## 2022-02-22 DIAGNOSIS — R7303 Prediabetes: Secondary | ICD-10-CM | POA: Diagnosis not present

## 2022-02-22 DIAGNOSIS — M5116 Intervertebral disc disorders with radiculopathy, lumbar region: Secondary | ICD-10-CM | POA: Diagnosis not present

## 2022-02-22 DIAGNOSIS — K5730 Diverticulosis of large intestine without perforation or abscess without bleeding: Secondary | ICD-10-CM | POA: Diagnosis not present

## 2022-02-22 NOTE — Telephone Encounter (Signed)
He can continue ASA 81 mg and will not take Eliquis on Sunday or Monday. Thanks

## 2022-02-22 NOTE — Telephone Encounter (Signed)
Patient is advised to hold off starting Eliquis until after the procedure. He will receive instructions on the day of the procedure.

## 2022-02-22 NOTE — Telephone Encounter (Signed)
Inbound call from [facility] stating tjhat patients insurance will not cover his prep medication.  Patients insurnace is requesting Peg prep or Clenpiq be sent in. Please advise.

## 2022-02-22 NOTE — Telephone Encounter (Signed)
Spoke with Suezanne Jacquet, PT in home for the patient helping him with his physical therapy post hip replacement. Suezanne Jacquet has helped the patient access his My Chart account. The patient wants to review his instructions. Explained how to get to the instructions written in the chart. Advised to review the instructions that say "colonoscopy with Plenvu." The patient is going to start Eliquis. He is presently on ASA 81 mg BID. His colonoscopy and EGD are on 02/25/22. I have asked Dr Yetta Numbers, the orthopedic surgeon prescribing the Eliquis, if he can hold the Eliquis until after the procedures.

## 2022-02-22 NOTE — Telephone Encounter (Signed)
Spoke with pharmacy staff. Coupon from [facility] was faxed with the prescription. Pharmacy given the coupon information again. Plenvu will now cost $60 for the patient.

## 2022-02-25 ENCOUNTER — Encounter: Payer: Self-pay | Admitting: Gastroenterology

## 2022-02-25 ENCOUNTER — Ambulatory Visit (AMBULATORY_SURGERY_CENTER): Payer: Medicare HMO | Admitting: Gastroenterology

## 2022-02-25 ENCOUNTER — Telehealth: Payer: Self-pay | Admitting: *Deleted

## 2022-02-25 ENCOUNTER — Other Ambulatory Visit: Payer: Self-pay | Admitting: Physician Assistant

## 2022-02-25 VITALS — BP 137/69 | HR 68 | Temp 99.3°F | Resp 16 | Ht 71.0 in | Wt 240.0 lb

## 2022-02-25 DIAGNOSIS — D649 Anemia, unspecified: Secondary | ICD-10-CM

## 2022-02-25 DIAGNOSIS — C20 Malignant neoplasm of rectum: Secondary | ICD-10-CM

## 2022-02-25 DIAGNOSIS — C7951 Secondary malignant neoplasm of bone: Secondary | ICD-10-CM

## 2022-02-25 DIAGNOSIS — R933 Abnormal findings on diagnostic imaging of other parts of digestive tract: Secondary | ICD-10-CM

## 2022-02-25 DIAGNOSIS — K6289 Other specified diseases of anus and rectum: Secondary | ICD-10-CM | POA: Diagnosis not present

## 2022-02-25 DIAGNOSIS — C801 Malignant (primary) neoplasm, unspecified: Secondary | ICD-10-CM | POA: Diagnosis not present

## 2022-02-25 DIAGNOSIS — I10 Essential (primary) hypertension: Secondary | ICD-10-CM | POA: Diagnosis not present

## 2022-02-25 MED ORDER — SODIUM CHLORIDE 0.9 % IV SOLN: 500.0000 mL | Freq: Once | INTRAVENOUS | Status: DC

## 2022-02-25 NOTE — Op Note (Addendum)
[facility] Patient Name: Joseph Hogan Procedure Date: 02/25/2022 1:32 PM MRN: [id number] Endoscopist: Mauri Pole , MD, [id number] Age: 62 Referring MD:  Date of Birth: 12/13/1960 Gender: Male Account #: [id number] Procedure:                Colonoscopy Indications:              Suspected colorectal cancer, Abnormal CT of the GI                            tract Medicines:                Monitored Anesthesia Care Procedure:                Pre-Anesthesia Assessment:                           - Prior to the procedure, a History and Physical                            was performed, and patient medications and                            allergies were reviewed. The patient's tolerance of                            previous anesthesia was also reviewed. The risks                            and benefits of the procedure and the sedation                            options and risks were discussed with the patient.                            All questions were answered, and informed consent                            was obtained. Prior Anticoagulants: The patient has                            taken no anticoagulant or antiplatelet agents                            except for aspirin. ASA Grade Assessment: II - A                            patient with mild systemic disease. After reviewing                            the risks and benefits, the patient was deemed in                            satisfactory condition to undergo the procedure.  After obtaining informed consent, the colonoscope                            was passed under direct vision. Throughout the                            procedure, the patient's blood pressure, pulse, and                            oxygen saturations were monitored continuously. The                            Olympus Scope [id number] was introduced through the                            anus and advanced to the the  cecum, identified by                            appendiceal orifice and ileocecal valve. The                            colonoscopy was performed without difficulty. The                            patient tolerated the procedure well. The quality                            of the bowel preparation was poor. The ileocecal                            valve, appendiceal orifice, and rectum were                            photographed. Scope In: 1:45:03 PM Scope Out: 1:59:45 PM Scope Withdrawal Time: 0 hours 10 minutes 25 seconds  Total Procedure Duration: 0 hours 14 minutes 42 seconds  Findings:                 The perianal and digital rectal examinations were                            normal.                           An infiltrative, polypoid and ulcerated                            non-obstructing medium-sized mass was found in the                            rectum. The mass was non-circumferential. The mass                            measured four cm in length. Oozing was present.  This was biopsied with a cold forceps for                            histology. Area was tattooed with an injection of 2                            mL of Spot (carbon black).                           A moderate amount of stool was found in the entire                            colon, interfering with visualization. Lavage of                            the area was performed, resulting in incomplete                            clearance with continued poor visualization.                           Non-bleeding external and internal hemorrhoids were                            found during retroflexion. The hemorrhoids were                            medium-sized.                           Scattered small-mouthed diverticula were found in                            the sigmoid colon. Complications:            No immediate complications. Estimated Blood Loss:     Estimated blood loss was  minimal. Impression:               - Preparation of the colon was poor.                           - Likely malignant tumor in the rectum. Biopsied.                            Tattooed.                           - Stool in the entire examined colon.                           - Non-bleeding external and internal hemorrhoids.                           - Diverticulosis in the sigmoid colon. Recommendation:           - Patient has a contact number available for  emergencies. The signs and symptoms of potential                            delayed complications were discussed with the                            patient. Return to normal activities tomorrow.                            Written discharge instructions were provided to the                            patient.                           - Resume previous diet.                           - Continue present medications.                           - Await pathology results.                           - Repeat colonoscopy at the next available                            appointment because the bowel preparation was                            suboptimal.                           - Start Eliquis (apixaban) tomorrow. Refer to                            managing physician for further adjustment of                            therapy. Mauri Pole, MD 02/25/2022 2:12:16 PM This report has been signed electronically.

## 2022-02-25 NOTE — Progress Notes (Signed)
Pt's states no medical or surgical changes since previsit or office visit. 

## 2022-02-25 NOTE — Op Note (Signed)
[facility] Patient Name: Joseph Hogan Procedure Date: 02/25/2022 1:33 PM MRN: [id number] Endoscopist: Mauri Pole , MD, [id number] Age: 62 Referring MD:  Date of Birth: Feb 12, 1960 Gender: Male Account #: [id number] Procedure:                Upper GI endoscopy Indications:              Endoscopy to confirm suspected neoplastic lesion of                            the stomach seen on previous imaging study,                            Abnormal CT of the GI tract Medicines:                Monitored Anesthesia Care Procedure:                Pre-Anesthesia Assessment:                           - Prior to the procedure, a History and Physical                            was performed, and patient medications and                            allergies were reviewed. The patient's tolerance of                            previous anesthesia was also reviewed. The risks                            and benefits of the procedure and the sedation                            options and risks were discussed with the patient.                            All questions were answered, and informed consent                            was obtained. Prior Anticoagulants: The patient has                            taken no anticoagulant or antiplatelet agents. ASA                            Grade Assessment: II - A patient with mild systemic                            disease. After reviewing the risks and benefits,                            the patient was deemed in satisfactory condition to  undergo the procedure.                           After obtaining informed consent, the endoscope was                            passed under direct vision. Throughout the                            procedure, the patient's blood pressure, pulse, and                            oxygen saturations were monitored continuously. The                            Olympus Scope [id number] was  introduced through the                            mouth, and advanced to the second part of duodenum.                            The upper GI endoscopy was accomplished without                            difficulty. The patient tolerated the procedure                            well. Scope In: Scope Out: Findings:                 The Z-line was regular and was found 38 cm from the                            incisors.                           No gross lesions were noted in the entire esophagus.                           A 2 cm hiatal hernia was present.                           The cardia and gastric fundus were normal on                            retroflexion.                           The stomach was normal.                           The examined duodenum was normal. Complications:            No immediate complications. Estimated Blood Loss:     Estimated blood loss was minimal. Impression:               - Z-line regular, 38 cm  from the incisors.                           - No gross lesions in the entire esophagus.                           - 2 cm hiatal hernia.                           - Normal stomach.                           - Normal examined duodenum.                           - No specimens collected. Recommendation:           - Patient has a contact number available for                            emergencies. The signs and symptoms of potential                            delayed complications were discussed with the                            patient. Return to normal activities tomorrow.                            Written discharge instructions were provided to the                            patient.                           - Resume previous diet.                           - Continue present medications.                           - See the other procedure note for documentation of                            additional recommendations. Mauri Pole,  MD 02/25/2022 2:05:30 PM This report has been signed electronically.

## 2022-02-25 NOTE — Progress Notes (Signed)
1315 Robinul 0.1 mg IV given due large amount of secretions upon assessment.  MD made aware, vss  

## 2022-02-25 NOTE — Progress Notes (Deleted)
[place] [phone]   Fax:([phone]) [place] NOTE  Patient Care Team: Leonie Douglas, MD as PCP - General (Family Medicine)  ONCOLOGIC HISTORY: 02/03/2022: Initially presented to ED with left hip pain following a fall. He was unable to walk due to severity of the pain.  Xray showed acute subcapital left fermoral neck fracture 02/04/2022:CT left hip: Acute pathological fracture of the left femoral neck with displacement and angulation with underlying lytic lesions in the femoral neck and head. There is also a lytic obstructive lesion of the left pubic bone extending to the superior pubic ramus. There are also prominent iliac lymph nodes measuring up to 1.2 cm.  02/05/2022: Labs were negative for monoclonal protein and PSA level was normal 02/06/2022: Underwent left total arthroplasty.  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 IHC studies raised possibility of GI primary 02/25/2021: Underwent EGD/colonoscopy. Colonoscopy revealed infiltrative, polypoid and ulcerated non-obstructing medium sized mass found in the rectum. Mass was non-circumferential and measured 4 cm in length. Oozing was present.  Pathology pending.   CHIEF COMPLAINTS/PURPOSE OF CONSULTATION:  Unknown primary with osseous metastases.   HISTORY OF PRESENTING ILLNESS:  Joseph Hogan 62 y.o. male with medical history significant for ***  On review of the previous records ***  On exam today ***  MEDICAL HISTORY:  Past Medical History:  Diagnosis Date   Arthritis    Hypertension    MRSA (methicillin resistant Staphylococcus aureus)    Pre-diabetes     SURGICAL HISTORY: Past Surgical History:  Procedure Laterality Date   TOTAL HIP ARTHROPLASTY Left 02/06/2022   Procedure: TOTAL HIP ARTHROPLASTY;  Surgeon: [name], MD;  Location: [facility];  Service: Orthopedics;  Laterality: Left;    SOCIAL HISTORY: Social History   Socioeconomic  History   Marital status: Divorced    Spouse name: Not on file   Number of children: Not on file   Years of education: Not on file   Highest education level: Not on file  Occupational History   Not on file  Tobacco Use   Smoking status: Never   Smokeless tobacco: Never  Vaping Use   Vaping Use: Never used  Substance and Sexual Activity   Alcohol use: No   Drug use: No   Sexual activity: Not on file  Other Topics Concern   Not on file  Social History Narrative   Not on file   Social Determinants of Health   Financial Resource Strain: Not on file  Food Insecurity: No Food Insecurity (02/04/2022)   Hunger Vital Sign    Worried About Running Out of Food in the Last Year: Never true    Ran Out of Food in the Last Year: Never true  Transportation Needs: No Transportation Needs (02/04/2022)   PRAPARE - Hydrologist (Medical): No    Lack of Transportation (Non-Medical): No  Physical Activity: Not on file  Stress: Not on file  Social Connections: Not on file  Intimate Partner Violence: Not At Risk (02/04/2022)   Humiliation, Afraid, Rape, and Kick questionnaire    Fear of Current or Ex-Partner: No    Emotionally Abused: No    Physically Abused: No    Sexually Abused: No    FAMILY HISTORY: Family History  Problem Relation Age of Onset   Stroke Mother    Hypertension Mother    Heart disease Father    Alzheimer's disease Father    Hypertension Brother  Alcohol abuse Brother    Cancer Maternal Aunt    Heart disease Maternal Aunt    Cancer Maternal Uncle    Heart disease Maternal Uncle    Cancer Paternal Aunt    Heart disease Paternal Aunt    Cancer Paternal Uncle    Heart disease Paternal Uncle    Heart disease Paternal Grandmother    Alzheimer's disease Paternal Grandfather    Stomach cancer Neg Hx    Rectal cancer Neg Hx    Esophageal cancer Neg Hx    Colon cancer Neg Hx     ALLERGIES:  is allergic to codeine and  lisinopril.  MEDICATIONS:  Current Outpatient Medications  Medication Sig Dispense Refill   Ascorbic Acid (VITAMIN C) 100 MG tablet Take 100 mg by mouth daily.     aspirin EC 81 MG tablet Take 1 tablet (81 mg total) by mouth 2 (two) times daily for 28 days. Swallow whole. 56 tablet 0   B Complex-C (B-COMPLEX WITH VITAMIN C) tablet Take 1 tablet by mouth daily.     Beta Carotene (VITAMIN A) 25000 UNIT capsule Take 25,000 Units by mouth daily.     cholecalciferol (VITAMIN D) 1000 units tablet Take 1,000 Units by mouth daily.     ELIQUIS 2.5 MG TABS tablet Take 2.5 mg by mouth 2 (two) times daily. (Patient not taking: Reported on 02/25/2022)     gabapentin (NEURONTIN) 300 MG capsule Take 1 capsule (300 mg total) by mouth 3 (three) times daily. (Patient not taking: Reported on 02/25/2022) 90 capsule 0   methocarbamol (ROBAXIN) 500 MG tablet Take 1 tablet (500 mg total) by mouth every 6 (six) hours as needed for muscle spasms. (Patient not taking: Reported on 02/25/2022) 30 tablet 0   Multiple Vitamin (MULTIVITAMIN WITH MINERALS) TABS tablet Take 1 tablet by mouth daily. (Patient not taking: Reported on 02/25/2022)     oxyCODONE (OXY IR/ROXICODONE) 5 MG immediate release tablet Take 5 mg by mouth every 4 (four) hours as needed.     senna-docusate (SENOKOT-S) 8.6-50 MG tablet Take 1 tablet by mouth at bedtime. 30 tablet 0   tamsulosin (FLOMAX) 0.4 MG CAPS capsule Take 1 capsule (0.4 mg total) by mouth daily. (Patient not taking: Reported on 02/25/2022) 30 capsule 0   vitamin E 180 MG (400 UNITS) capsule Take 400 Units by mouth daily.     zinc gluconate 50 MG tablet Take 50 mg by mouth daily.     No current facility-administered medications for this visit.    REVIEW OF SYSTEMS:   Constitutional: ( - ) fevers, ( - )  chills , ( - ) night sweats Eyes: ( - ) blurriness of vision, ( - ) double vision, ( - ) watery eyes Ears, nose, mouth, throat, and face: ( - ) mucositis, ( - ) sore throat Respiratory: (  - ) cough, ( - ) dyspnea, ( - ) wheezes Cardiovascular: ( - ) palpitation, ( - ) chest discomfort, ( - ) lower extremity swelling Gastrointestinal:  ( - ) nausea, ( - ) heartburn, ( - ) change in bowel habits Skin: ( - ) abnormal skin rashes Lymphatics: ( - ) new lymphadenopathy, ( - ) easy bruising Neurological: ( - ) numbness, ( - ) tingling, ( - ) new weaknesses Behavioral/Psych: ( - ) mood change, ( - ) new changes  All other systems were reviewed with the patient and are negative.  PHYSICAL EXAMINATION: ECOG PERFORMANCE STATUS: {CHL ONC ECOG [id number]  There  were no vitals filed for this visit. There were no vitals filed for this visit.  GENERAL: well appearing *** in NAD  SKIN: skin color, texture, turgor are normal, no rashes or significant lesions EYES: conjunctiva are pink and non-injected, sclera clear OROPHARYNX: no exudate, no erythema; lips, buccal mucosa, and tongue normal  NECK: supple, non-tender LYMPH:  no palpable lymphadenopathy in the cervical, axillary or supraclavicular lymph nodes.  LUNGS: clear to auscultation and percussion with normal breathing effort HEART: regular rate & rhythm and no murmurs and no lower extremity edema ABDOMEN: soft, non-tender, non-distended, normal bowel sounds Musculoskeletal: no cyanosis of digits and no clubbing  PSYCH: alert & oriented x 3, fluent speech NEURO: no focal motor/sensory deficits  LABORATORY DATA:  I have reviewed the data as listed    Latest Ref Rng & Units 02/08/2022    4:08 AM 02/07/2022    8:53 AM 02/06/2022    3:11 AM  CBC  WBC 4.0 - 10.5 K/uL 11.5  12.3  11.2   Hemoglobin 13.0 - 17.0 g/dL 10.3  11.5  12.9   Hematocrit 39.0 - 52.0 % 31.3  35.0  38.8   Platelets 150 - 400 K/uL 409  427  372        Latest Ref Rng & Units 02/07/2022    8:53 AM 02/06/2022    3:11 AM 02/04/2022    3:16 AM  CMP  Glucose 70 - 99 mg/dL 209  131  145   BUN 8 - 23 mg/dL 31  22  17   $ Creatinine 0.61 - 1.24 mg/dL 0.98   1.07  0.79   Sodium 135 - 145 mmol/L 138  135  134   Potassium 3.5 - 5.1 mmol/L 5.1  4.2  4.1   Chloride 98 - 111 mmol/L 101  100  102   CO2 22 - 32 mmol/L 27  25  24   $ Calcium 8.9 - 10.3 mg/dL 8.6  8.8  8.8   Total Protein 6.5 - 8.1 g/dL   6.8   Total Bilirubin 0.3 - 1.2 mg/dL   0.3   Alkaline Phos 38 - 126 U/L   100   AST 15 - 41 U/L   24   ALT 0 - 44 U/L   18      PATHOLOGY: ***  BLOOD FILM: *** Review of the peripheral blood smear showed normal appearing white cells with neutrophils that were appropriately lobated and granulated. There was no predominance of bi-lobed or hyper-segmented neutrophils appreciated. No Dohle bodies were noted. There was no left shifting, immature forms or blasts noted. Lymphocytes remain normal in size without any predominance of large granular lymphocytes. Red cells show no anisopoikilocytosis, macrocytes , microcytes or polychromasia. There were no schistocytes, target cells, echinocytes, acanthocytes, dacrocytes, or stomatocytes.There was no rouleaux formation, nucleated red cells, or intra-cellular inclusions noted. The platelets are normal in size, shape, and color without any clumping evident.  RADIOGRAPHIC STUDIES: I have personally reviewed the radiological images as listed and agreed with the findings in the report. DG HIP UNILAT W OR W/O PELVIS 2-3 VIEWS LEFT  Result Date: 02/06/2022 CLINICAL DATA:  Status post total left hip arthroplasty EXAM: DG HIP (WITH OR WITHOUT PELVIS) 2-3V LEFT COMPARISON:  02/03/2022 FINDINGS: Postoperative change of left THA. No radiographic evidence of loosening. No acute abnormality. IMPRESSION: Expected postoperative change of left THA. Electronically Signed   By: Placido Sou M.D.   On: 02/06/2022 20:14   DG Pelvis Portable  Result  Date: 02/06/2022 CLINICAL DATA:  Left hip arthroplasty EXAM: PORTABLE PELVIS 1-2 VIEWS COMPARISON:  02/05/2022 FINDINGS: There is interval left hip arthroplasty. No fracture is seen.  In the previous study, comminuted fracture was seen in the neck of left femur. Cortical margins of left pubic bone appear less distinct. A lytic lesion was seen in the same region in the previous CT. IMPRESSION: Status post left hip arthroplasty. Electronically Signed   By: Elmer Picker M.D.   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 * Tracking Code: BO * EXAM: CT CHEST, ABDOMEN, AND PELVIS WITH CONTRAST TECHNIQUE: Multidetector CT imaging of the chest, abdomen and pelvis was performed following the standard protocol during bolus administration of intravenous contrast. RADIATION DOSE REDUCTION: This exam was performed according to the departmental dose-optimization program which includes automated exposure control, adjustment of the mA and/or kV according to patient size and/or use of iterative reconstruction technique. CONTRAST:  72m OMNIPAQUE IOHEXOL 350 MG/ML SOLN COMPARISON:  CT left hip 02/04/2022 FINDINGS: CT CHEST FINDINGS Cardiovascular: Unremarkable Mediastinum/Nodes: Left supraclavicular node 1.2 cm in short axis, image 3 series 4. Small type 1 hiatal hernia. Lower thoracic periaortic lymph node 0.8 cm in short axis on image 51 series 4. Lungs/Pleura: Bandlike atelectasis especially dependently in both lower lobes and to a lesser degree in the lingula and posteriorly in the right upper lobe. No well-defined pulmonary nodules. Musculoskeletal: Pathologic fracture anteriorly in the fourth rib, image 52 series 6. Deformity anteriorly in the right second rib probably from early pathologic fracture, image 33 series 6. Marrow heterogeneity in the third rib anteriorly on image 44 series 6 without visible fracture. Thoracic spondylosis. Small lucency inferiorly in the T7 vertebral body, image 80 series 8, probably a Schmorl's  node. CT ABDOMEN PELVIS FINDINGS Hepatobiliary: No well-defined focal hepatic lesion identified. Gallbladder unremarkable. No biliary dilatation. Pancreas: Unremarkable Spleen: Unremarkable Adrenals/Urinary Tract: No adrenal mass identified. Hypodense right renal lesions favor cysts and measure below 20 Hounsfield units. No further imaging workup of these lesions is indicated. Urinary bladder unremarkable. 1.1 cm exophytic lesion from the left mid kidney posterolaterally internal density 34 Hounsfield units on image 89 series 7, nonspecific for complex cyst versus tumor. Stomach/Bowel: Wall thickening in the proximal stomach body is likely secondary to nondistention. Wall thickening in the rectum as on image 114 series 4 nonspecific and could be inflammatory, with tumor is a less likely differential diagnostic consideration given the fairly long segment of involvement measuring about 10 cm in length. Sigmoid colon diverticulosis. Normal appendix. Redundant transverse colon. Vascular/Lymphatic: Pathologic retroperitoneal adenopathy observed, index periaortic lymph node 1.9 cm in short axis on image 70 series 4. Right common iliac node 1.4 cm in short axis, image 89 series 4. Right external iliac adenopathy noted with index lymph node 1.6 cm in short axis, image 107 series 4. Left external iliac lymph node 1.3 cm in short axis, image 105 series 4. Reproductive: Unremarkable Other: Substantial presacral edema noted, with some trace fluid in lower paracolic gutters bilaterally. Perirectal stranding noted with multiple small perirectal lymph nodes including a 0.8 cm node on image 102 series 4. Musculoskeletal: As noted on dedicated exams, lytic destructive lesions of the left superior and inferior pubic ramus and pubic body are noted along with a pathologic fracture through the left femoral neck 1, and adjacent edema along  fascia planes. Left hip joint effusion. Thickening/edema along the left obturator internus muscle.  Edema tracks along the left hip adductor musculature. Lumbar spondylosis and degenerative disc disease causing generally mild multilevel impingement. IMPRESSION: 1. Pathologic retroperitoneal and bilateral pelvic adenopathy, with lytic destructive lesions of the left superior and inferior pubic ramus and pubic body as well as a pathologic fracture through the left femoral neck. Pathologic fracture anteriorly in the right second rib. Deformity anteriorly in the right third rib probably from early pathologic fracture. Metastatic disease or myeloma strongly favored over multifocal osteomyelitis as a cause. 2. Mildly enlarged left supraclavicular lymph node and lower thoracic periaortic lymph node. Pathologic retroperitoneal and pelvic adenopathy. 3. 10 cm long segment of rectal wall thickening, possibly inflammatory although rectal tumor is a differential diagnostic consideration. 4. 1.1 cm exophytic lesion from the left mid kidney posterolaterally, nonspecific for complex cyst versus tumor. This could be further assessed with dedicated renal protocol MRI with and without contrast if clinically warranted, although such a small lesion is highly unlikely to be the cause of the adenopathy and bony lesions. 5. Bandlike atelectasis in both lungs. 6. Small type 1 hiatal hernia. 7. Lumbar spondylosis and degenerative disc disease causing generally mild multilevel impingement. 8. Sigmoid colon diverticulosis. 9. Wall thickening in the proximal stomach body, probably secondary to nondistention. Electronically Signed   By: Van Clines M.D.   On: 02/05/2022 18:36   CT FEMUR LEFT WO CONTRAST  Result Date: 02/05/2022 CLINICAL DATA:  Metastatic disease to the skeleton and left hip EXAM: CT OF THE LOWER LEFT EXTREMITY WITHOUT CONTRAST TECHNIQUE: Multidetector CT imaging of the lower left extremity was performed according to the standard protocol. RADIATION DOSE REDUCTION: This exam was performed according to the  departmental dose-optimization program which includes automated exposure control, adjustment of the mA and/or kV according to patient size and/or use of iterative reconstruction technique. COMPARISON:  Left hip CT 02/04/2022 FINDINGS: Bones/Joint/Cartilage Lytic metastatic involvement in the left superior pubic ramus, left pubic body, and extending into the anterior aspect of the right inferior pubic ramus. Cortical destruction along the medial margin of the left pubis adjacent to the pubic symphysis on image 50 of series 3, along with a pathologic fracture of the superior pubic ramus in the vicinity of the lesion Intra-articular left femoral neck fracture associated with some indistinct lucency in the femoral neck compatible with a pathologic fracture. Varus angulation noted along with about 0.6 cm medial displacement of the femoral head fragment with respect to the distal fragment. Prominent osteoarthritis of the knee. Extensive tricompartmental spurring and loss of articular space. 8 mm free osteochondral fragment posteriorly in the left knee joint. Moderate degenerative chondral thinning in the left hip joint. No obvious lytic destructive findings in the left femur distal to the femoral neck. Ligaments Suboptimally assessed by CT. Muscles and Tendons Mild edema noted along fascia planes in the vicinity of the femoral neck fracture. Otherwise unremarkable. Soft tissues Presacral edema is partially included. Superficial femoral artery atherosclerotic vascular calcification is present. IMPRESSION: 1. 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 There is a pathologic fracture of the left superior pubic ramus adjacent to the pubic symphysis. 2. Pathologic intra-articular fracture of the left femoral neck with about 0.6 cm of medial displacement of the femoral head fragment with respect to the distal fragment. 3. Prominent  tricompartmental osteoarthritis of the left knee. 4. 8 mm free osteochondral fragment  posteriorly in the left knee joint. 5. SFA atherosclerotic vascular calcification. 6. Presacral edema is partially included. Electronically Signed   By: Van Clines M.D.   On: 02/05/2022 18:16   CT HIP LEFT WO CONTRAST  Result Date: 02/04/2022 CLINICAL DATA:  Hip trauma, fracture suspected, xray done EXAM: CT OF THE LEFT HIP WITHOUT CONTRAST TECHNIQUE: Multidetector CT imaging of the left hip was performed according to the standard protocol. Multiplanar CT image reconstructions were also generated. RADIATION DOSE REDUCTION: This exam was performed according to the departmental dose-optimization program which includes automated exposure control, adjustment of the mA and/or kV according to patient size and/or use of iterative reconstruction technique. COMPARISON:  Hip radiograph 02/03/2022 FINDINGS: Bones/Joint/Cartilage There is an acute displaced subcapital left femoral neck fracture with 9 mm displacement and angulation. There are underlying lucencies at the fracture site and cortical thinning of the anterior posterior aspect of the femoral head. Additionally, there is a lytic destructive lesion of the left parasymphyseal pubic bone with pathologic fracture of the superior pubic ramus. Lytic lesion appears to extend throughout the superior pubic ramus to the puboacetabular junction (series 5, image 39). Possible small lesion in the left inferior pubic ramus (series 5, image 64) and in the posterior acetabulum (series 5, image 28). Ligaments Suboptimally assessed by CT. Muscles and Tendons There is hypoattenuation in the obturator internus muscle, pelvic component, the more anterior of which may be reflective of tumor involvement or posttraumatic sequelae. Soft tissues Left hip joint effusion. Soft tissue swelling along the hip. No focal fluid collection. Sigmoid diverticulosis noted. Prominent left external iliac lymph  node measuring 1.2 cm (series 5, image 12). Other external iliac lymph node measures 1.2 cm (series 5, image 15). IMPRESSION: Acute pathologic fracture of the left femoral neck with displacement and angulation, with underlying lytic lesions in the femoral head and neck. Additional lytic destructive lesion of the left parasymphyseal pubic bone extending throughout the superior pubic ramus to the puboacetabular junction, with pathologic fracture of the superior pubic ramus. Adjacent hypoattenuation in the pelvic component of the obturator internus muscle, suspicious for tumor involvement. Probable small lytic lesion in the inferior pubic ramus. Prominent left external iliac lymph nodes measuring up to 1.2 cm. Recommend malignancy workup including SPEP/UPEP and CT of the chest, abdomen, and pelvis. These results were called by telephone at the time of interpretation on 02/04/2022 at 10:49 am to provider Mechele Claude, PA-C, who verbally acknowledged these results. Electronically Signed   By: Maurine Simmering M.D.   On: 02/04/2022 10:56   DG Hip Unilat W or Wo Pelvis 2-3 Views Left  Result Date: 02/03/2022 CLINICAL DATA:  Left hip pain after fall EXAM: DG HIP (WITH OR WITHOUT PELVIS) 2-3V LEFT COMPARISON:  12/22/2021 FINDINGS: Acute displaced subcapital left femoral neck fracture. The distal femoral shaft is displaced superiorly and laterally. The left femoral head remains seated within the left acetabulum. IMPRESSION: Acute subcapital left femoral neck fracture. Electronically Signed   By: Placido Sou M.D.   On: 02/03/2022 17:31    ASSESSMENT & PLAN ***  No orders of the defined types were placed in this encounter.   All questions were answered. The patient knows to call the clinic with any problems, questions or concerns.  I have spent a total of {CHL ONC TIME VISIT - [id number] minutes of face-to-face and non-face-to-face time, preparing to see the patient, obtaining and/or reviewing separately  obtained history, performing a medically appropriate examination, counseling and educating the patient, ordering medications/tests/procedures,  referring and communicating with other health care professionals, documenting clinical information in the electronic health record, independently interpreting results and communicating results to the patient, and care coordination.   Dede Query, PA-C Department of Hematology/Oncology [place] at [facility] Phone: [phone]

## 2022-02-25 NOTE — Progress Notes (Signed)
Report given to PACU, vss 

## 2022-02-25 NOTE — Progress Notes (Signed)
1355 Fentanyl 25 mcg given for comfort and cleared by MD, vss

## 2022-02-25 NOTE — Progress Notes (Signed)
[facility] History and Physical   Primary Care Physician:  Leonie Douglas, MD   Reason for Procedure:  Metastaic malignancy of unknown primary, abnormal CT of GI tract  Plan:    EGD and colonoscopy with possible interventions as needed     HPI: Joseph Hogan is a very pleasant 62 y.o. 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  The risks and benefits as well as alternatives of endoscopic procedure(s) have been discussed and reviewed. All questions answered. The patient agrees to proceed.    Past Medical History:  Diagnosis Date   Arthritis    Hypertension    MRSA (methicillin resistant Staphylococcus aureus)    Pre-diabetes     Past Surgical History:  Procedure Laterality Date   TOTAL HIP ARTHROPLASTY Left 02/06/2022   Procedure: TOTAL HIP ARTHROPLASTY;  Surgeon: [name], MD;  Location: [facility];  Service: Orthopedics;  Laterality: Left;    Prior to Admission medications   Medication Sig Start Date End Date Taking? Authorizing Provider  PEG-KCl-NaCl-NaSulf-Na Asc-C (PLENVU) 140 g SOLR Take as instructed by gastroenterologist 02/20/22   Mauri Pole, MD  Ascorbic Acid (VITAMIN C) 100 MG tablet Take 100 mg by mouth daily.    [provider]  aspirin EC 81 MG tablet Take 1 tablet (81 mg total) by mouth 2 (two) times daily for 28 days. Swallow whole. 02/11/22 03/11/22  Barb Merino, MD  B Complex-C (B-COMPLEX WITH VITAMIN C) tablet Take 1 tablet by mouth daily.    [provider]  Beta Carotene (VITAMIN A) 25000 UNIT capsule Take 25,000 Units by mouth daily.    [provider]  cholecalciferol (VITAMIN D) 1000 units tablet Take 1,000 Units by mouth daily.    [provider]  gabapentin (NEURONTIN) 300 MG capsule Take 1 capsule (300 mg total) by mouth 3 (three) times daily. 02/11/22 03/13/22  Barb Merino, MD  ibuprofen  (ADVIL) 400 MG tablet Take 600 mg by mouth every 6 (six) hours as needed for mild pain.    [provider]  methocarbamol (ROBAXIN) 500 MG tablet Take 1 tablet (500 mg total) by mouth every 6 (six) hours as needed for muscle spasms. 02/11/22 03/13/22  Barb Merino, MD  Multiple Vitamin (MULTIVITAMIN WITH MINERALS) TABS tablet Take 1 tablet by mouth daily.    [provider]  senna-docusate (SENOKOT-S) 8.6-50 MG tablet Take 1 tablet by mouth at bedtime. 02/11/22 03/13/22  Barb Merino, MD  tamsulosin (FLOMAX) 0.4 MG CAPS capsule Take 1 capsule (0.4 mg total) by mouth daily. 02/11/22 03/13/22  Barb Merino, MD  vitamin E 180 MG (400 UNITS) capsule Take 400 Units by mouth daily.    [provider]  zinc gluconate 50 MG tablet Take 50 mg by mouth daily.    [provider]    Current Outpatient Medications  Medication Sig Dispense Refill   PEG-KCl-NaCl-NaSulf-Na Asc-C (PLENVU) 140 g SOLR Take as instructed by gastroenterologist 1 each 0   Ascorbic Acid (VITAMIN C) 100 MG tablet Take 100 mg by mouth daily.     aspirin EC 81 MG tablet Take 1 tablet (81 mg total) by mouth 2 (two) times daily for 28 days. Swallow whole. 56 tablet 0   B Complex-C (B-COMPLEX WITH VITAMIN C) tablet Take 1 tablet by mouth daily.     Beta Carotene (VITAMIN A) 25000 UNIT capsule Take 25,000 Units by mouth daily.  cholecalciferol (VITAMIN D) 1000 units tablet Take 1,000 Units by mouth daily.     gabapentin (NEURONTIN) 300 MG capsule Take 1 capsule (300 mg total) by mouth 3 (three) times daily. 90 capsule 0   ibuprofen (ADVIL) 400 MG tablet Take 600 mg by mouth every 6 (six) hours as needed for mild pain.     methocarbamol (ROBAXIN) 500 MG tablet Take 1 tablet (500 mg total) by mouth every 6 (six) hours as needed for muscle spasms. 30 tablet 0   Multiple Vitamin (MULTIVITAMIN WITH MINERALS) TABS tablet Take 1 tablet by mouth daily.     senna-docusate (SENOKOT-S) 8.6-50 MG tablet Take 1 tablet  by mouth at bedtime. 30 tablet 0   tamsulosin (FLOMAX) 0.4 MG CAPS capsule Take 1 capsule (0.4 mg total) by mouth daily. 30 capsule 0   vitamin E 180 MG (400 UNITS) capsule Take 400 Units by mouth daily.     zinc gluconate 50 MG tablet Take 50 mg by mouth daily.     No current facility-administered medications for this visit.    Allergies as of 02/25/2022 - Review Complete 02/21/2022  Allergen Reaction Noted   Codeine Nausea And Vomiting 12/06/2011   Lisinopril Cough 08/24/2015    Family History  Problem Relation Age of Onset   Stroke Mother    Hypertension Mother    Heart disease Father    Alzheimer's disease Father    Hypertension Brother    Alcohol abuse Brother    Cancer Maternal Aunt    Heart disease Maternal Aunt    Cancer Maternal Uncle    Heart disease Maternal Uncle    Cancer Paternal Aunt    Heart disease Paternal Aunt    Cancer Paternal Uncle    Heart disease Paternal Uncle    Heart disease Paternal Grandmother    Alzheimer's disease Paternal Grandfather     Social History   Socioeconomic History   Marital status: Divorced    Spouse name: Not on file   Number of children: Not on file   Years of education: Not on file   Highest education level: Not on file  Occupational History   Not on file  Tobacco Use   Smoking status: Never   Smokeless tobacco: Never  Vaping Use   Vaping Use: Never used  Substance and Sexual Activity   Alcohol use: No   Drug use: No   Sexual activity: Not on file  Other Topics Concern   Not on file  Social History Narrative   Not on file   Social Determinants of Health   Financial Resource Strain: Not on file  Food Insecurity: No Food Insecurity (02/04/2022)   Hunger Vital Sign    Worried About Running Out of Food in the Last Year: Never true    Ran Out of Food in the Last Year: Never true  Transportation Needs: No Transportation Needs (02/04/2022)   PRAPARE - Hydrologist (Medical): No     Lack of Transportation (Non-Medical): No  Physical Activity: Not on file  Stress: Not on file  Social Connections: Not on file  Intimate Partner Violence: Not At Risk (02/04/2022)   Humiliation, Afraid, Rape, and Kick questionnaire    Fear of Current or Ex-Partner: No    Emotionally Abused: No    Physically Abused: No    Sexually Abused: No    Review of Systems:  All other review of systems negative except as mentioned in the HPI.  Physical Exam:  Vital signs in last 24 hours: There were no vitals taken for this visit. General:   Alert, NAD Lungs:  Clear .   Heart:  Regular rate and rhythm Abdomen:  Soft, nontender and nondistended. Neuro/Psych:  Alert and cooperative. Normal mood and affect. A and O x 3  Reviewed labs, radiology imaging, old records and pertinent past GI work up  Patient is appropriate for planned procedure(s) and anesthesia in an ambulatory setting   K. Denzil Magnuson , MD [phone]

## 2022-02-25 NOTE — Patient Instructions (Addendum)
Recommendation: Patient has a contact number available for                            emergencies. The signs and symptoms of potential                            delayed complications were discussed with the                            patient. Return to normal activities tomorrow.                            Written discharge instructions were provided to the                            patient.                           - Resume previous diet.                           - Continue present medications.                           - Await pathology results.                           - Repeat colonoscopy at the next available                            appointment because the bowel preparation was                            suboptimal.  START ELIQUIS  (APIXABAN) TOMORROW!  REFER TO MANAGING PHYSICIAN FOR FURTHER ADJUSTMENT OF THERAPY.   YOU HAD AN ENDOSCOPIC PROCEDURE TODAY AT [facility]:   Refer to the procedure report that was given to you for any specific questions about what was found during the examination.  If the procedure report does not answer your questions, please call your gastroenterologist to clarify.  If you requested that your care partner not be given the details of your procedure findings, then the procedure report has been included in a sealed envelope for you to review at your convenience later.  YOU SHOULD EXPECT: Some feelings of bloating in the abdomen. Passage of more gas than usual.  Walking can help get rid of the air that was put into your GI tract during the procedure and reduce the bloating. If you had a lower endoscopy (such as a colonoscopy or flexible sigmoidoscopy) you may notice spotting of blood in your stool or on the toilet paper. If you underwent a bowel prep for your procedure, you may not have a normal bowel movement for a few days.  Please Note:  You might notice some irritation and congestion in your nose or some drainage.  This is from the  oxygen used during your procedure.  There is no need for concern and it should clear up in a day or so.  SYMPTOMS TO REPORT IMMEDIATELY:  Following lower endoscopy (  colonoscopy or flexible sigmoidoscopy):  Excessive amounts of blood in the stool  Significant tenderness or worsening of abdominal pains  Swelling of the abdomen that is new, acute  Fever of 100F or higher  Following upper endoscopy (EGD)  Vomiting of blood or coffee ground material  New chest pain or pain under the shoulder blades  Painful or persistently difficult swallowing  New shortness of breath  Black, tarry-looking stools  For urgent or emergent issues, a gastroenterologist can be reached at any hour by calling [phone]. Do not use MyChart messaging for urgent concerns.    DIET:  We do recommend a small meal at first, but then you may proceed to your regular diet.  Drink plenty of fluids but you should avoid alcoholic beverages for 24 hours.  ACTIVITY:  You should plan to take it easy for the rest of today and you should NOT DRIVE or use heavy machinery until tomorrow (because of the sedation medicines used during the test).    FOLLOW UP: Our staff will call the number listed on your records the next business day following your procedure.  We will call around 7:15- 8:00 am to check on you and address any questions or concerns that you may have regarding the information given to you following your procedure. If we do not reach you, we will leave a message.     If any biopsies were taken you will be contacted by phone or by letter within the next 1-3 weeks.  Please call us at [phone] if you have not heard about the biopsies in 3 weeks.    SIGNATURES/CONFIDENTIALITY: You and/or your care partner have signed paperwork which will be entered into your electronic medical record.  These signatures attest to the fact that that the information above on your After Visit Summary has been reviewed and is  understood.  Full responsibility of the confidentiality of this discharge information lies with you and/or your care-partner.

## 2022-02-25 NOTE — Progress Notes (Signed)
Called to room to assist during endoscopic procedure.  Patient ID and intended procedure confirmed with present staff. Received instructions for my participation in the procedure from the performing physician.  

## 2022-02-25 NOTE — Telephone Encounter (Signed)
Prep instructions reviewed with patient. Scheduled for repeat Colonoscopy due to poor prep with Dr. Silverio Decamp on 03/27/22 at 8:30.

## 2022-02-26 ENCOUNTER — Ambulatory Visit: Payer: Medicare HMO | Admitting: Physician Assistant

## 2022-02-26 ENCOUNTER — Inpatient Hospital Stay: Payer: Medicare HMO | Admitting: Physician Assistant

## 2022-02-26 ENCOUNTER — Other Ambulatory Visit: Payer: Medicare HMO

## 2022-02-26 ENCOUNTER — Telehealth: Payer: Self-pay

## 2022-02-26 ENCOUNTER — Inpatient Hospital Stay: Payer: Medicare HMO | Attending: Physician Assistant

## 2022-02-26 DIAGNOSIS — S72042A Displaced fracture of base of neck of left femur, initial encounter for closed fracture: Secondary | ICD-10-CM | POA: Diagnosis not present

## 2022-02-26 NOTE — Telephone Encounter (Signed)
Left message on follow up call. 

## 2022-02-27 ENCOUNTER — Telehealth: Payer: Self-pay | Admitting: Physician Assistant

## 2022-02-27 DIAGNOSIS — M5116 Intervertebral disc disorders with radiculopathy, lumbar region: Secondary | ICD-10-CM | POA: Diagnosis not present

## 2022-02-27 DIAGNOSIS — K449 Diaphragmatic hernia without obstruction or gangrene: Secondary | ICD-10-CM | POA: Diagnosis not present

## 2022-02-27 DIAGNOSIS — Z96642 Presence of left artificial hip joint: Secondary | ICD-10-CM | POA: Diagnosis not present

## 2022-02-27 DIAGNOSIS — S72012D Unspecified intracapsular fracture of left femur, subsequent encounter for closed fracture with routine healing: Secondary | ICD-10-CM | POA: Diagnosis not present

## 2022-02-27 DIAGNOSIS — R7303 Prediabetes: Secondary | ICD-10-CM | POA: Diagnosis not present

## 2022-02-27 DIAGNOSIS — K5730 Diverticulosis of large intestine without perforation or abscess without bleeding: Secondary | ICD-10-CM | POA: Diagnosis not present

## 2022-02-27 DIAGNOSIS — I10 Essential (primary) hypertension: Secondary | ICD-10-CM | POA: Diagnosis not present

## 2022-02-27 DIAGNOSIS — D72829 Elevated white blood cell count, unspecified: Secondary | ICD-10-CM | POA: Diagnosis not present

## 2022-02-27 DIAGNOSIS — M4726 Other spondylosis with radiculopathy, lumbar region: Secondary | ICD-10-CM | POA: Diagnosis not present

## 2022-02-27 NOTE — Telephone Encounter (Signed)
Called patient and patient's daughters to r/s missed appointments. Left voicemail on each number in demographics.

## 2022-02-28 ENCOUNTER — Ambulatory Visit: Payer: Medicare HMO | Admitting: Internal Medicine

## 2022-03-01 ENCOUNTER — Ambulatory Visit: Payer: Medicare HMO | Admitting: Internal Medicine

## 2022-03-01 DIAGNOSIS — D72829 Elevated white blood cell count, unspecified: Secondary | ICD-10-CM | POA: Diagnosis not present

## 2022-03-01 DIAGNOSIS — I10 Essential (primary) hypertension: Secondary | ICD-10-CM | POA: Diagnosis not present

## 2022-03-01 DIAGNOSIS — M4726 Other spondylosis with radiculopathy, lumbar region: Secondary | ICD-10-CM | POA: Diagnosis not present

## 2022-03-01 DIAGNOSIS — Z96642 Presence of left artificial hip joint: Secondary | ICD-10-CM | POA: Diagnosis not present

## 2022-03-01 DIAGNOSIS — S72042A Displaced fracture of base of neck of left femur, initial encounter for closed fracture: Secondary | ICD-10-CM | POA: Diagnosis not present

## 2022-03-01 DIAGNOSIS — K449 Diaphragmatic hernia without obstruction or gangrene: Secondary | ICD-10-CM | POA: Diagnosis not present

## 2022-03-01 DIAGNOSIS — M5116 Intervertebral disc disorders with radiculopathy, lumbar region: Secondary | ICD-10-CM | POA: Diagnosis not present

## 2022-03-01 DIAGNOSIS — K5730 Diverticulosis of large intestine without perforation or abscess without bleeding: Secondary | ICD-10-CM | POA: Diagnosis not present

## 2022-03-01 DIAGNOSIS — R7303 Prediabetes: Secondary | ICD-10-CM | POA: Diagnosis not present

## 2022-03-01 DIAGNOSIS — S72012D Unspecified intracapsular fracture of left femur, subsequent encounter for closed fracture with routine healing: Secondary | ICD-10-CM | POA: Diagnosis not present

## 2022-03-04 ENCOUNTER — Telehealth: Payer: Self-pay | Admitting: Physician Assistant

## 2022-03-04 DIAGNOSIS — S72012D Unspecified intracapsular fracture of left femur, subsequent encounter for closed fracture with routine healing: Secondary | ICD-10-CM | POA: Diagnosis not present

## 2022-03-04 DIAGNOSIS — R7303 Prediabetes: Secondary | ICD-10-CM | POA: Diagnosis not present

## 2022-03-04 DIAGNOSIS — I10 Essential (primary) hypertension: Secondary | ICD-10-CM | POA: Diagnosis not present

## 2022-03-04 DIAGNOSIS — M5116 Intervertebral disc disorders with radiculopathy, lumbar region: Secondary | ICD-10-CM | POA: Diagnosis not present

## 2022-03-04 DIAGNOSIS — K449 Diaphragmatic hernia without obstruction or gangrene: Secondary | ICD-10-CM | POA: Diagnosis not present

## 2022-03-04 DIAGNOSIS — D72829 Elevated white blood cell count, unspecified: Secondary | ICD-10-CM | POA: Diagnosis not present

## 2022-03-04 DIAGNOSIS — K5730 Diverticulosis of large intestine without perforation or abscess without bleeding: Secondary | ICD-10-CM | POA: Diagnosis not present

## 2022-03-04 DIAGNOSIS — M4726 Other spondylosis with radiculopathy, lumbar region: Secondary | ICD-10-CM | POA: Diagnosis not present

## 2022-03-04 DIAGNOSIS — Z96642 Presence of left artificial hip joint: Secondary | ICD-10-CM | POA: Diagnosis not present

## 2022-03-04 NOTE — Telephone Encounter (Signed)
Called to get patient scheduled per 1/22 secure chat. Spoke with patient's daughter who will bring him to appointment. Scheduled and will be notified.

## 2022-03-07 DIAGNOSIS — M5116 Intervertebral disc disorders with radiculopathy, lumbar region: Secondary | ICD-10-CM | POA: Diagnosis not present

## 2022-03-07 DIAGNOSIS — R7303 Prediabetes: Secondary | ICD-10-CM | POA: Diagnosis not present

## 2022-03-07 DIAGNOSIS — D72829 Elevated white blood cell count, unspecified: Secondary | ICD-10-CM | POA: Diagnosis not present

## 2022-03-07 DIAGNOSIS — K449 Diaphragmatic hernia without obstruction or gangrene: Secondary | ICD-10-CM | POA: Diagnosis not present

## 2022-03-07 DIAGNOSIS — S72012D Unspecified intracapsular fracture of left femur, subsequent encounter for closed fracture with routine healing: Secondary | ICD-10-CM | POA: Diagnosis not present

## 2022-03-07 DIAGNOSIS — K5730 Diverticulosis of large intestine without perforation or abscess without bleeding: Secondary | ICD-10-CM | POA: Diagnosis not present

## 2022-03-07 DIAGNOSIS — I10 Essential (primary) hypertension: Secondary | ICD-10-CM | POA: Diagnosis not present

## 2022-03-07 DIAGNOSIS — M4726 Other spondylosis with radiculopathy, lumbar region: Secondary | ICD-10-CM | POA: Diagnosis not present

## 2022-03-07 DIAGNOSIS — Z96642 Presence of left artificial hip joint: Secondary | ICD-10-CM | POA: Diagnosis not present

## 2022-03-13 DIAGNOSIS — D72829 Elevated white blood cell count, unspecified: Secondary | ICD-10-CM | POA: Diagnosis not present

## 2022-03-13 DIAGNOSIS — M4726 Other spondylosis with radiculopathy, lumbar region: Secondary | ICD-10-CM | POA: Diagnosis not present

## 2022-03-13 DIAGNOSIS — Z96642 Presence of left artificial hip joint: Secondary | ICD-10-CM | POA: Diagnosis not present

## 2022-03-13 DIAGNOSIS — R7303 Prediabetes: Secondary | ICD-10-CM | POA: Diagnosis not present

## 2022-03-13 DIAGNOSIS — K5730 Diverticulosis of large intestine without perforation or abscess without bleeding: Secondary | ICD-10-CM | POA: Diagnosis not present

## 2022-03-13 DIAGNOSIS — I10 Essential (primary) hypertension: Secondary | ICD-10-CM | POA: Diagnosis not present

## 2022-03-13 DIAGNOSIS — M5116 Intervertebral disc disorders with radiculopathy, lumbar region: Secondary | ICD-10-CM | POA: Diagnosis not present

## 2022-03-13 DIAGNOSIS — K449 Diaphragmatic hernia without obstruction or gangrene: Secondary | ICD-10-CM | POA: Diagnosis not present

## 2022-03-13 DIAGNOSIS — S72012D Unspecified intracapsular fracture of left femur, subsequent encounter for closed fracture with routine healing: Secondary | ICD-10-CM | POA: Diagnosis not present

## 2022-03-15 ENCOUNTER — Encounter: Payer: Self-pay | Admitting: Physician Assistant

## 2022-03-15 ENCOUNTER — Other Ambulatory Visit: Payer: Self-pay

## 2022-03-15 ENCOUNTER — Inpatient Hospital Stay (HOSPITAL_BASED_OUTPATIENT_CLINIC_OR_DEPARTMENT_OTHER): Payer: Medicare HMO | Admitting: Physician Assistant

## 2022-03-15 ENCOUNTER — Inpatient Hospital Stay: Payer: Medicare HMO | Attending: Physician Assistant

## 2022-03-15 VITALS — BP 157/94 | HR 113 | Temp 98.1°F | Resp 18 | Wt 211.4 lb

## 2022-03-15 DIAGNOSIS — Z5111 Encounter for antineoplastic chemotherapy: Secondary | ICD-10-CM | POA: Diagnosis not present

## 2022-03-15 DIAGNOSIS — C20 Malignant neoplasm of rectum: Secondary | ICD-10-CM | POA: Diagnosis not present

## 2022-03-15 DIAGNOSIS — I10 Essential (primary) hypertension: Secondary | ICD-10-CM | POA: Insufficient documentation

## 2022-03-15 DIAGNOSIS — C7951 Secondary malignant neoplasm of bone: Secondary | ICD-10-CM

## 2022-03-15 DIAGNOSIS — D75839 Thrombocytosis, unspecified: Secondary | ICD-10-CM | POA: Insufficient documentation

## 2022-03-15 DIAGNOSIS — D509 Iron deficiency anemia, unspecified: Secondary | ICD-10-CM | POA: Insufficient documentation

## 2022-03-15 DIAGNOSIS — M899 Disorder of bone, unspecified: Secondary | ICD-10-CM

## 2022-03-15 DIAGNOSIS — C772 Secondary and unspecified malignant neoplasm of intra-abdominal lymph nodes: Secondary | ICD-10-CM | POA: Diagnosis not present

## 2022-03-15 DIAGNOSIS — G893 Neoplasm related pain (acute) (chronic): Secondary | ICD-10-CM | POA: Insufficient documentation

## 2022-03-15 DIAGNOSIS — Z7901 Long term (current) use of anticoagulants: Secondary | ICD-10-CM | POA: Diagnosis not present

## 2022-03-15 DIAGNOSIS — Z79899 Other long term (current) drug therapy: Secondary | ICD-10-CM | POA: Insufficient documentation

## 2022-03-15 DIAGNOSIS — M25511 Pain in right shoulder: Secondary | ICD-10-CM | POA: Insufficient documentation

## 2022-03-15 LAB — CBC WITH DIFFERENTIAL (CANCER CENTER ONLY)
Abs Immature Granulocytes: 0.07 10*3/uL (ref 0.00–0.07)
Basophils Absolute: 0.1 10*3/uL (ref 0.0–0.1)
Basophils Relative: 1 %
Eosinophils Absolute: 0.2 10*3/uL (ref 0.0–0.5)
Eosinophils Relative: 2 %
HCT: 32 % — ABNORMAL LOW (ref 39.0–52.0)
Hemoglobin: 10.2 g/dL — ABNORMAL LOW (ref 13.0–17.0)
Immature Granulocytes: 1 %
Lymphocytes Relative: 12 %
Lymphs Abs: 1.1 10*3/uL (ref 0.7–4.0)
MCH: 26 pg (ref 26.0–34.0)
MCHC: 31.9 g/dL (ref 30.0–36.0)
MCV: 81.4 fL (ref 80.0–100.0)
Monocytes Absolute: 0.8 10*3/uL (ref 0.1–1.0)
Monocytes Relative: 8 %
Neutro Abs: 7 10*3/uL (ref 1.7–7.7)
Neutrophils Relative %: 76 %
Platelet Count: 650 10*3/uL — ABNORMAL HIGH (ref 150–400)
RBC: 3.93 MIL/uL — ABNORMAL LOW (ref 4.22–5.81)
RDW: 13.5 % (ref 11.5–15.5)
WBC Count: 9.2 10*3/uL (ref 4.0–10.5)
nRBC: 0 % (ref 0.0–0.2)

## 2022-03-15 LAB — CMP (CANCER CENTER ONLY)
ALT: 14 U/L (ref 0–44)
AST: 22 U/L (ref 15–41)
Albumin: 3.4 g/dL — ABNORMAL LOW (ref 3.5–5.0)
Alkaline Phosphatase: 180 U/L — ABNORMAL HIGH (ref 38–126)
Anion gap: 10 (ref 5–15)
BUN: 12 mg/dL (ref 8–23)
CO2: 27 mmol/L (ref 22–32)
Calcium: 9.6 mg/dL (ref 8.9–10.3)
Chloride: 100 mmol/L (ref 98–111)
Creatinine: 0.62 mg/dL (ref 0.61–1.24)
GFR, Estimated: >60 mL/min (ref >60)
Glucose, Bld: 148 mg/dL — ABNORMAL HIGH (ref 70–99)
Potassium: 4.5 mmol/L (ref 3.5–5.1)
Sodium: 137 mmol/L (ref 135–145)
Total Bilirubin: 0.5 mg/dL (ref 0.3–1.2)
Total Protein: 7.5 g/dL (ref 6.5–8.1)

## 2022-03-15 LAB — CEA (ACCESS): CEA (CHCC): 11.17 ng/mL — ABNORMAL HIGH (ref 0.00–5.00)

## 2022-03-16 MED ORDER — OXYCODONE HCL 5 MG PO TABS: 5.0000 mg | ORAL_TABLET | ORAL | 0 refills | Status: DC | PRN

## 2022-03-16 MED ORDER — PROCHLORPERAZINE MALEATE 10 MG PO TABS: 10.0000 mg | ORAL_TABLET | Freq: Four times a day (QID) | ORAL | 0 refills | Status: DC | PRN

## 2022-03-16 MED ORDER — ONDANSETRON HCL 8 MG PO TABS: 90.0000 mg | ORAL_TABLET | Freq: Three times a day (TID) | ORAL | 0 refills | Status: DC | PRN

## 2022-03-16 NOTE — Progress Notes (Unsigned)
[facility] [phone]   [phone]  PROGRESS NOTE  Patient Care Team: Leonie Douglas, MD as PCP - General (Family Medicine)  CHIEF COMPLAINTS/PURPOSE OF CONSULTATION:  Metastatic rectal carcinoma  ONCOLOGIC HISTORY: 02/03/2022-02/11/2022: Presented to ED due to left hip pain after a mechanical fall.  02/04/2022: CT left EVO:JJKKX pathologic fracture of the left femoral neck with displacement and angulation, with underlying lytic lesions in the femoral head and neck.Additional lytic destructive lesion of the left parasymphyseal pubic bone extending throughout the superior pubic ramus to the puboacetabular junction, with pathologic fracture of the superior pubic ramus. Adjacent hypoattenuation in the pelvic component of the obturator internus muscle, suspicious for tumor involvement. Probable small lytic lesion in the inferior pubic ramus.Prominent left external iliac lymph nodes measuring up to 1.2 cm. 02/05/2022: CT CAP: Pathologic retroperitoneal and bilateral pelvic adenopathy, with lytic destructive lesions of the left superior and inferior pubic ramus and pubic body as well as a pathologic fracture through the left femoral neck. Pathologic fracture anteriorly in the right second rib. Deformity anteriorly in the right third rib probably from early pathologic fracture. Metastatic disease or myeloma strongly favored over multifocal osteomyelitis as a cause. Mildly enlarged left supraclavicular lymph node and lower thoracic periaortic lymph node. Pathologic retroperitoneal and pelvic adenopathy. 10 cm long segment of rectal wall thickening.1.1 cm exophytic lesion from the left mid kidney posterolaterally, nonspecific for complex cyst versus tumor. Bandlike atelectasis in both lungs.Small type 1 hiatal hernia.Lumbar spondylosis and degenerative disc disease causing generally mild multilevel impingement. Sigmoid colon diverticulosis.Wall thickening in the  proximal stomach body, probably secondary to nondistention. 02/06/2022: Underwent left total hip arthroplasty. Pathology revealed poorly differentiated carcinoma.  SPEP/IFE did not detect monoclonal protein. PSA normal. 02/25/2022: Underwent colonoscopy that showed malignant tumor in the rectum. Pathology confirmed poorly differentiated carcinoma.   HISTORY OF PRESENTING ILLNESS:  Joseph Hogan 62 y.o. male returns for follow-up after completion of workup that confirmed metastatic rectal carcinoma.  Patient is accompanied by his son for this visit.  On exam today, Mr. Brittian reports that his strength and ambulation are slowly improving since hospital discharge.  He is undergoing physical therapy which has greatly improved his strength.  He does complain of knee pain left greater than right.  He does take oxycodone 5 mg as needed with improvement of pain.  He uses a walker to help with ambulation and is starting to use a cane under the direction of his physical therapist.  He denies any recent falls since hospital discharge.  Patient reports swelling in his lower extremities have improved.  He does have pain in his right shoulder and right side of his chest around his ribs.  Patient reports improvement of appetite but has lost approximately 30 pounds since 02/25/2022.  He denies nausea, vomiting or abdominal pain.  His bowel habits are unchanged without recurrent episodes of diarrhea or constipation.  He denies easy bruising or signs of active bleeding including hematochezia or melena.   He denies fevers, chills, night sweats, shortness of breath or cough.  He has no other complaints.  Rest of the 10 point ROS is below.  MEDICAL HISTORY:  Past Medical History:  Diagnosis Date   Arthritis    Hypertension    MRSA (methicillin resistant Staphylococcus aureus)    Pre-diabetes     SURGICAL HISTORY: Past Surgical History:  Procedure Laterality Date   TOTAL HIP ARTHROPLASTY Left 02/06/2022   Procedure:  TOTAL HIP ARTHROPLASTY;  Surgeon: [name],  MD;  Location: [facility];  Service: Orthopedics;  Laterality: Left;    SOCIAL HISTORY: Social History   Socioeconomic History   Marital status: Divorced    Spouse name: Not on file   Number of children: Not on file   Years of education: Not on file   Highest education level: Not on file  Occupational History   Not on file  Tobacco Use   Smoking status: Never   Smokeless tobacco: Never  Vaping Use   Vaping Use: Never used  Substance and Sexual Activity   Alcohol use: No   Drug use: No   Sexual activity: Not on file  Other Topics Concern   Not on file  Social History Narrative   Not on file   Social Determinants of Health   Financial Resource Strain: Not on file  Food Insecurity: No Food Insecurity (02/04/2022)   Hunger Vital Sign    Worried About Running Out of Food in the Last Year: Never true    Ran Out of Food in the Last Year: Never true  Transportation Needs: No Transportation Needs (02/04/2022)   PRAPARE - Hydrologist (Medical): No    Lack of Transportation (Non-Medical): No  Physical Activity: Not on file  Stress: Not on file  Social Connections: Not on file  Intimate Partner Violence: Not At Risk (02/04/2022)   Humiliation, Afraid, Rape, and Kick questionnaire    Fear of Current or Ex-Partner: No    Emotionally Abused: No    Physically Abused: No    Sexually Abused: No    FAMILY HISTORY: Family History  Problem Relation Age of Onset   Stroke Mother    Hypertension Mother    Heart disease Father    Alzheimer's disease Father    Hypertension Brother    Alcohol abuse Brother    Cancer Maternal Aunt    Heart disease Maternal Aunt    Cancer Maternal Uncle    Heart disease Maternal Uncle    Cancer Paternal Aunt    Heart disease Paternal Aunt    Cancer Paternal Uncle    Heart disease Paternal Uncle    Heart disease Paternal Grandmother    Alzheimer's disease Paternal  Grandfather    Stomach cancer Neg Hx    Rectal cancer Neg Hx    Esophageal cancer Neg Hx    Colon cancer Neg Hx     ALLERGIES:  is allergic to codeine and lisinopril.  MEDICATIONS:  Current Outpatient Medications  Medication Sig Dispense Refill   Ascorbic Acid (VITAMIN C) 100 MG tablet Take 100 mg by mouth daily.     B Complex-C (B-COMPLEX WITH VITAMIN C) tablet Take 1 tablet by mouth daily.     Beta Carotene (VITAMIN A) 25000 UNIT capsule Take 25,000 Units by mouth daily.     cholecalciferol (VITAMIN D) 1000 units tablet Take 1,000 Units by mouth daily.     ELIQUIS 2.5 MG TABS tablet Take 2.5 mg by mouth 2 (two) times daily.     Multiple Vitamin (MULTIVITAMIN WITH MINERALS) TABS tablet Take 1 tablet by mouth daily.     ondansetron (ZOFRAN) 8 MG tablet Take 11.5 tablets (92 mg total) by mouth every 8 (eight) hours as needed for nausea or vomiting. 90 tablet 0   prochlorperazine (COMPAZINE) 10 MG tablet Take 1 tablet (10 mg total) by mouth every 6 (six) hours as needed for nausea or vomiting. 90 tablet 0   vitamin E 180 MG (400 UNITS)  capsule Take 400 Units by mouth daily.     zinc gluconate 50 MG tablet Take 50 mg by mouth daily.     gabapentin (NEURONTIN) 300 MG capsule Take 1 capsule (300 mg total) by mouth 3 (three) times daily. (Patient not taking: Reported on 02/25/2022) 90 capsule 0   oxyCODONE (OXY IR/ROXICODONE) 5 MG immediate release tablet Take 1 tablet (5 mg total) by mouth every 4 (four) hours as needed. 90 tablet 0   No current facility-administered medications for this visit.    REVIEW OF SYSTEMS:   Constitutional: ( - ) fevers, ( - )  chills , ( - ) night sweats Eyes: ( - ) blurriness of vision, ( - ) double vision, ( - ) watery eyes Ears, nose, mouth, throat, and face: ( - ) mucositis, ( - ) sore throat Respiratory: ( - ) cough, ( - ) dyspnea, ( - ) wheezes Cardiovascular: ( - ) palpitation, ( - ) chest discomfort, ( - ) lower extremity swelling Gastrointestinal:  ( -  ) nausea, ( - ) heartburn, ( - ) change in bowel habits Skin: ( - ) abnormal skin rashes Lymphatics: ( - ) new lymphadenopathy, ( - ) easy bruising Neurological: ( - ) numbness, ( - ) tingling, ( - ) new weaknesses Behavioral/Psych: ( - ) mood change, ( - ) new changes  All other systems were reviewed with the patient and are negative.  PHYSICAL EXAMINATION: ECOG PERFORMANCE STATUS: 1 - Symptomatic but completely ambulatory  Vitals:   03/15/22 1008  BP: (!) 157/94  Pulse: (!) 113  Resp: 18  Temp: 98.1 F (36.7 C)  SpO2: 100%   Filed Weights   03/15/22 1008  Weight: 211 lb 6.4 oz (95.9 kg)    GENERAL: well appearing male in NAD  SKIN: skin color, texture, turgor are normal, no rashes or significant lesions EYES: conjunctiva are pink and non-injected, sclera clear OROPHARYNX: no exudate, no erythema; lips, buccal mucosa, and tongue normal  NECK: supple, non-tender LYMPH:  no palpable lymphadenopathy in the cervical or supraclavicular lymph nodes.  LUNGS: clear to auscultation and percussion with normal breathing effort HEART: regular rate & rhythm and no murmurs and no lower extremity edema Musculoskeletal: no cyanosis of digits and no clubbing  PSYCH: alert & oriented x 3, fluent speech NEURO: no focal motor/sensory deficits  LABORATORY DATA:  I have reviewed the data as listed    Latest Ref Rng & Units 03/15/2022    9:52 AM 02/08/2022    4:08 AM 02/07/2022    8:53 AM  CBC  WBC 4.0 - 10.5 K/uL 9.2  11.5  12.3   Hemoglobin 13.0 - 17.0 g/dL 10.2  10.3  11.5   Hematocrit 39.0 - 52.0 % 32.0  31.3  35.0   Platelets 150 - 400 K/uL 650  409  427        Latest Ref Rng & Units 03/15/2022    9:52 AM 02/07/2022    8:53 AM 02/06/2022    3:11 AM  CMP  Glucose 70 - 99 mg/dL 148  209  131   BUN 8 - 23 mg/dL '12  31  22   '$ Creatinine 0.61 - 1.24 mg/dL 0.62  0.98  1.07   Sodium 135 - 145 mmol/L 137  138  135   Potassium 3.5 - 5.1 mmol/L 4.5  5.1  4.2   Chloride 98 - 111 mmol/L  100  101  100   CO2 22 - 32 mmol/L  $'27  27  25   'r$ Calcium 8.9 - 10.3 mg/dL 9.6  8.6  8.8   Total Protein 6.5 - 8.1 g/dL 7.5     Total Bilirubin 0.3 - 1.2 mg/dL 0.5     Alkaline Phos 38 - 126 U/L 180     AST 15 - 41 U/L 22     ALT 0 - 44 U/L 14        PATHOLOGY: Rectum, biopsy POORLY DIFFERENTIATED CARCINOMA. SEE NOTE. Diagnosis Note Addendum: Immunohistochemistry shows the carcinoma is positive with MOC-31, cytokeratin 20 and CDX2. Tumor is negative with cytokeratin 7, cytokeratin 5/6, p40, TTF-1, Napsin A, prostate-specific antigen and prostein. The immunophenotype is consistent with primary colorectal adenocarcinoma.  RADIOGRAPHIC STUDIES: I have personally reviewed the radiological images as listed and agreed with the findings in the report. No results found.  ASSESSMENT & PLAN RAYLEE ADAMEC is a 62 y.o. male who presents to the clinic for a follow up for recently diagnosed rectal carcinoma.   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  --We reviewed that his cancer is incurable and mainstay treatment is chemotherapy. --Recommend chemotherapy regimen FOLFOX plus avastin q 2 weeks.  --Reviewed dose, frequency and common side effects including cytopenias, nausea, diarrhea, mucositis, cold sensitivity, bleeding risk and peripheral neuropathy.  --Plan to start chemotherapy after port placement.   #Neoplasm related pain: --Secondary to underlying bone metastases --Currently taking oxycodone 5 mg PO q 4-6 hours as needed. Refill sent.  --Consider palliative radiation if there is worsening pain.  #Supportive Care -- chemotherapy education to be scheduled  -- port placement to be scheduled.  -- zofran '8mg'$  q8H PRN and compazine '10mg'$  PO q6H for nausea -- EMLA cream for port  Orders Placed This Encounter  Procedures   IR  IMAGING GUIDED PORT INSERTION    Standing Status:   Future    Standing Expiration Date:   03/16/2023    Order Specific Question:   Reason for Exam (SYMPTOM  OR DIAGNOSIS REQUIRED)    Answer:   port for chemo    Order Specific Question:   Preferred Imaging Location?    Answer:   [facility]    All questions were answered. The patient knows to call the clinic with any problems, questions or concerns.  I have spent a total of 30 minutes minutes of face-to-face and non-face-to-face time, preparing to see the patient, performing a medically appropriate examination, counseling and educating the patient, ordering medications/tests/procedures,  documenting clinical information in the electronic health record,  and care coordination.   Dede Query, PA-C Department of Hematology/Oncology [place] at [facility] Phone: [phone]  Patient was seen with Dr. [name]  I have read the above note and personally examined the patient. I agree with the assessment and plan as noted above.  Briefly Mr. Keay is a 62 year old male initially presented to the hospital with a femur fracture and was found to have metastatic disease of unclear etiology.  Our workup has revealed with biopsy of the initial site as well as colonoscopy the patient has a metastatic rectal cancer.  At this time would recommend pursuing treatment with FOLFOX plus bevacizumab.  Today we discussed the risks and benefits of this therapy.  We also discussed the incurable nature of his metastatic disease and expected course moving forward.  The patient and his son  voiced understanding of the plan moving forward.  Will plan to have the patient back to start treatment once his port has been placed.   Ledell Peoples, MD Department of Hematology/Oncology [place] at [facility] Phone: [phone] Pager: [phone] Email: [email or web address]

## 2022-03-17 ENCOUNTER — Other Ambulatory Visit: Payer: Self-pay | Admitting: Physician Assistant

## 2022-03-18 ENCOUNTER — Telehealth: Payer: Self-pay | Admitting: Physician Assistant

## 2022-03-18 ENCOUNTER — Encounter: Payer: Self-pay | Admitting: Gastroenterology

## 2022-03-18 DIAGNOSIS — I10 Essential (primary) hypertension: Secondary | ICD-10-CM | POA: Diagnosis not present

## 2022-03-18 DIAGNOSIS — Z96642 Presence of left artificial hip joint: Secondary | ICD-10-CM | POA: Diagnosis not present

## 2022-03-18 DIAGNOSIS — K449 Diaphragmatic hernia without obstruction or gangrene: Secondary | ICD-10-CM | POA: Diagnosis not present

## 2022-03-18 DIAGNOSIS — S72012D Unspecified intracapsular fracture of left femur, subsequent encounter for closed fracture with routine healing: Secondary | ICD-10-CM | POA: Diagnosis not present

## 2022-03-18 DIAGNOSIS — D72829 Elevated white blood cell count, unspecified: Secondary | ICD-10-CM | POA: Diagnosis not present

## 2022-03-18 DIAGNOSIS — M4726 Other spondylosis with radiculopathy, lumbar region: Secondary | ICD-10-CM | POA: Diagnosis not present

## 2022-03-18 DIAGNOSIS — R7303 Prediabetes: Secondary | ICD-10-CM | POA: Diagnosis not present

## 2022-03-18 DIAGNOSIS — M5116 Intervertebral disc disorders with radiculopathy, lumbar region: Secondary | ICD-10-CM | POA: Diagnosis not present

## 2022-03-18 DIAGNOSIS — K5730 Diverticulosis of large intestine without perforation or abscess without bleeding: Secondary | ICD-10-CM | POA: Diagnosis not present

## 2022-03-18 NOTE — Telephone Encounter (Signed)
Called patient and patient's daughter to schedule patient education. Patient's daughter will call back to confirm with patient if time chosen will work.

## 2022-03-19 ENCOUNTER — Encounter: Payer: Self-pay | Admitting: Hematology and Oncology

## 2022-03-19 ENCOUNTER — Telehealth: Payer: Self-pay | Admitting: Hematology and Oncology

## 2022-03-19 ENCOUNTER — Telehealth: Payer: Self-pay

## 2022-03-19 DIAGNOSIS — C20 Malignant neoplasm of rectum: Secondary | ICD-10-CM

## 2022-03-19 HISTORY — DX: Malignant neoplasm of rectum: C20

## 2022-03-19 NOTE — Progress Notes (Signed)
START ON PATHWAY REGIMEN - Colorectal     A cycle is every 14 days:     Bevacizumab-xxxx      Oxaliplatin      Leucovorin      Fluorouracil      Fluorouracil   **Always confirm dose/schedule in your pharmacy ordering system**  Patient Characteristics: Distant Metastases, Nonsurgical Candidate, KRAS/NRAS Mutation Positive/Unknown (BRAF V600 Wild-Type/Unknown), Standard Cytotoxic Therapy, First Line Standard Cytotoxic Therapy, Bevacizumab Eligible, PS = 0,1 Tumor Location: Rectal Therapeutic Status: Distant Metastases Microsatellite/Mismatch Repair Status: Unknown BRAF Mutation Status: Awaiting Test Results KRAS/NRAS Mutation Status: Awaiting Test Results Preferred Therapy Approach: Standard Cytotoxic Therapy Standard Cytotoxic Line of Therapy: First Line Standard Cytotoxic Therapy ECOG Performance Status: 1 Bevacizumab Eligibility: Eligible Intent of Therapy: Non-Curative / Palliative Intent, Discussed with Patient

## 2022-03-19 NOTE — Telephone Encounter (Signed)
Called patient's daughter to get them to return call about port placement. Left voicemail for daughter to call back.

## 2022-03-19 NOTE — Telephone Encounter (Signed)
[employer] message received on 03/16/22 from [place] wanting to clarify directions on pt's RX for Zofran 8 mg #90 tablets.  Instructions currently say for pt to take 11.5 tablets.  Dr Burney Gauze, Dr on Call, advised pharmacy for new directions of 1-2 tablets PO every 8 hours as needed for NV

## 2022-03-20 ENCOUNTER — Other Ambulatory Visit: Payer: Self-pay

## 2022-03-20 ENCOUNTER — Telehealth: Payer: Self-pay | Admitting: Hematology and Oncology

## 2022-03-20 NOTE — Telephone Encounter (Signed)
Called patient to try and confirm new appointments. Left voicemail with appointment information and contact details if needing to reschedule.

## 2022-03-21 ENCOUNTER — Other Ambulatory Visit: Payer: Self-pay

## 2022-03-21 MED ORDER — LIDOCAINE-PRILOCAINE 2.5-2.5 % EX CREA: 1.0000 | TOPICAL_CREAM | CUTANEOUS | 0 refills | Status: DC | PRN

## 2022-03-22 ENCOUNTER — Inpatient Hospital Stay: Payer: Medicare HMO

## 2022-03-22 ENCOUNTER — Other Ambulatory Visit: Payer: Medicare HMO

## 2022-03-25 DIAGNOSIS — M4726 Other spondylosis with radiculopathy, lumbar region: Secondary | ICD-10-CM | POA: Diagnosis not present

## 2022-03-25 DIAGNOSIS — S72012D Unspecified intracapsular fracture of left femur, subsequent encounter for closed fracture with routine healing: Secondary | ICD-10-CM | POA: Diagnosis not present

## 2022-03-25 DIAGNOSIS — Z96642 Presence of left artificial hip joint: Secondary | ICD-10-CM | POA: Diagnosis not present

## 2022-03-25 DIAGNOSIS — M5116 Intervertebral disc disorders with radiculopathy, lumbar region: Secondary | ICD-10-CM | POA: Diagnosis not present

## 2022-03-25 DIAGNOSIS — D72829 Elevated white blood cell count, unspecified: Secondary | ICD-10-CM | POA: Diagnosis not present

## 2022-03-25 DIAGNOSIS — K5730 Diverticulosis of large intestine without perforation or abscess without bleeding: Secondary | ICD-10-CM | POA: Diagnosis not present

## 2022-03-25 DIAGNOSIS — K449 Diaphragmatic hernia without obstruction or gangrene: Secondary | ICD-10-CM | POA: Diagnosis not present

## 2022-03-25 DIAGNOSIS — R7303 Prediabetes: Secondary | ICD-10-CM | POA: Diagnosis not present

## 2022-03-25 DIAGNOSIS — I10 Essential (primary) hypertension: Secondary | ICD-10-CM | POA: Diagnosis not present

## 2022-03-25 NOTE — Progress Notes (Signed)
The following biosimilar Mvasi (bevacizumab-awwb) has been selected for use in this patient per insurance.  Henreitta Leber, PharmD 03/25/2022 @ 1430

## 2022-03-26 ENCOUNTER — Other Ambulatory Visit: Payer: Self-pay | Admitting: Radiology

## 2022-03-27 ENCOUNTER — Encounter: Payer: Medicare HMO | Admitting: Gastroenterology

## 2022-03-27 ENCOUNTER — Telehealth: Payer: Self-pay

## 2022-03-27 NOTE — Telephone Encounter (Signed)
T/C from pt's daughter,Elizabeth, requesting a refill for pt's Eliquis 2.5 mg One PO BID. Per daughter, this was prescribed while the patient was in the hospital and he is completely out

## 2022-03-27 NOTE — Telephone Encounter (Signed)
Pt's daughter advised and she will contact their office for medication refills.

## 2022-03-27 NOTE — Telephone Encounter (Signed)
Looks like Network engineer (Dr. Zachery Dakins at [facility]) prescribed it for DVT prophylaxis. Please request follow up with Dr. Zachery Dakins for additional refills.

## 2022-03-28 ENCOUNTER — Inpatient Hospital Stay (HOSPITAL_COMMUNITY)
Admission: RE | Admit: 2022-03-28 | Discharge: 2022-03-28 | Disposition: A | Discharge status: Home / Self Care | Payer: Medicare HMO | Source: Ambulatory Visit | Attending: Physician Assistant | Admitting: Physician Assistant

## 2022-03-28 ENCOUNTER — Ambulatory Visit (HOSPITAL_COMMUNITY): Payer: Medicare HMO

## 2022-03-29 ENCOUNTER — Other Ambulatory Visit: Payer: Self-pay | Admitting: Physician Assistant

## 2022-03-29 ENCOUNTER — Telehealth: Payer: Self-pay

## 2022-03-29 ENCOUNTER — Inpatient Hospital Stay: Payer: Medicare HMO

## 2022-03-29 ENCOUNTER — Other Ambulatory Visit: Payer: Self-pay

## 2022-03-29 DIAGNOSIS — C7951 Secondary malignant neoplasm of bone: Secondary | ICD-10-CM

## 2022-03-29 DIAGNOSIS — M25511 Pain in right shoulder: Secondary | ICD-10-CM

## 2022-03-29 NOTE — Telephone Encounter (Signed)
PC received today at 11:20 from pt daughter asking if pt can be seen while he is here for his 11:00 chemo teaching.  He has been having rt shoulder pain and his pain meds are not controlling his pain.  I spoke with the daughter and explained I did not retreive the message until his appt was over and asked if he mentioned this while in teaching and she said he did not.  She said his PT said it could be from using his walker but she wants to know if this could be cancer related.  Please advise

## 2022-04-01 ENCOUNTER — Other Ambulatory Visit: Payer: Self-pay | Admitting: Physician Assistant

## 2022-04-01 ENCOUNTER — Other Ambulatory Visit: Payer: Self-pay | Admitting: Hematology and Oncology

## 2022-04-01 ENCOUNTER — Other Ambulatory Visit: Payer: Self-pay

## 2022-04-01 MED ORDER — OXYCODONE HCL 5 MG PO TABS: 5.0000 mg | ORAL_TABLET | ORAL | 0 refills | Status: DC | PRN

## 2022-04-01 NOTE — Progress Notes (Signed)
Pharmacist Chemotherapy Monitoring - Initial Assessment    Anticipated start date: 04/08/22   The following has been reviewed per standard work regarding the patient's treatment regimen: The patient's diagnosis, treatment plan and drug doses, and organ/hematologic function Lab orders and baseline tests specific to treatment regimen  The treatment plan start date, drug sequencing, and pre-medications Prior authorization status  Patient's documented medication list, including drug-drug interaction screen and prescriptions for anti-emetics and supportive care specific to the treatment regimen The drug concentrations, fluid compatibility, administration routes, and timing of the medications to be used The patient's access for treatment and lifetime cumulative dose history, if applicable  The patient's medication allergies and previous infusion related reactions, if applicable   Changes made to treatment plan:  N/A  Follow up needed:  N/A   Joseph Hogan, Joseph Hogan, 04/01/2022  3:43 PM

## 2022-04-02 ENCOUNTER — Other Ambulatory Visit: Payer: Medicare HMO

## 2022-04-02 ENCOUNTER — Ambulatory Visit: Payer: Medicare HMO | Admitting: Hematology and Oncology

## 2022-04-02 ENCOUNTER — Ambulatory Visit: Payer: Medicare HMO

## 2022-04-02 NOTE — Telephone Encounter (Signed)
Pt's daughter advised his MRI of his rt shoulder is scheduled for 04/09/22 at [facility]

## 2022-04-04 ENCOUNTER — Other Ambulatory Visit: Payer: Self-pay | Admitting: Internal Medicine

## 2022-04-04 DIAGNOSIS — M4726 Other spondylosis with radiculopathy, lumbar region: Secondary | ICD-10-CM | POA: Diagnosis not present

## 2022-04-04 DIAGNOSIS — I10 Essential (primary) hypertension: Secondary | ICD-10-CM | POA: Diagnosis not present

## 2022-04-04 DIAGNOSIS — M5116 Intervertebral disc disorders with radiculopathy, lumbar region: Secondary | ICD-10-CM | POA: Diagnosis not present

## 2022-04-04 DIAGNOSIS — K449 Diaphragmatic hernia without obstruction or gangrene: Secondary | ICD-10-CM | POA: Diagnosis not present

## 2022-04-04 DIAGNOSIS — S72012D Unspecified intracapsular fracture of left femur, subsequent encounter for closed fracture with routine healing: Secondary | ICD-10-CM | POA: Diagnosis not present

## 2022-04-04 DIAGNOSIS — Z96642 Presence of left artificial hip joint: Secondary | ICD-10-CM | POA: Diagnosis not present

## 2022-04-04 DIAGNOSIS — K5730 Diverticulosis of large intestine without perforation or abscess without bleeding: Secondary | ICD-10-CM | POA: Diagnosis not present

## 2022-04-04 DIAGNOSIS — R7303 Prediabetes: Secondary | ICD-10-CM | POA: Diagnosis not present

## 2022-04-04 DIAGNOSIS — D72829 Elevated white blood cell count, unspecified: Secondary | ICD-10-CM | POA: Diagnosis not present

## 2022-04-05 ENCOUNTER — Ambulatory Visit (HOSPITAL_COMMUNITY)
Admission: RE | Admit: 2022-04-05 | Discharge: 2022-04-05 | Disposition: A | Discharge status: Home / Self Care | Payer: Medicare HMO | Source: Ambulatory Visit | Attending: Physician Assistant | Admitting: Physician Assistant

## 2022-04-05 ENCOUNTER — Encounter (HOSPITAL_COMMUNITY): Payer: Self-pay

## 2022-04-05 ENCOUNTER — Ambulatory Visit (HOSPITAL_COMMUNITY)
Admission: RE | Admit: 2022-04-05 | Discharge: 2022-04-05 | Disposition: A | Discharge status: Home / Self Care | Payer: Medicare HMO | Source: Ambulatory Visit

## 2022-04-05 ENCOUNTER — Other Ambulatory Visit: Payer: Self-pay

## 2022-04-05 DIAGNOSIS — C20 Malignant neoplasm of rectum: Secondary | ICD-10-CM

## 2022-04-05 DIAGNOSIS — Z452 Encounter for adjustment and management of vascular access device: Secondary | ICD-10-CM | POA: Diagnosis not present

## 2022-04-05 DIAGNOSIS — C19 Malignant neoplasm of rectosigmoid junction: Secondary | ICD-10-CM | POA: Insufficient documentation

## 2022-04-05 DIAGNOSIS — I10 Essential (primary) hypertension: Secondary | ICD-10-CM | POA: Diagnosis not present

## 2022-04-05 HISTORY — PX: IR IMAGING GUIDED PORT INSERTION: IMG5740

## 2022-04-05 MED ORDER — HEPARIN SOD (PORK) LOCK FLUSH 100 UNIT/ML IV SOLN
INTRAVENOUS | Status: AC
  Administered 2022-04-05: 500 [IU]
  Filled 2022-04-05: qty 5

## 2022-04-05 MED ORDER — LIDOCAINE HCL 1 % IJ SOLN
INTRAMUSCULAR | Status: AC
  Administered 2022-04-05: 20 mL
  Filled 2022-04-05: qty 20

## 2022-04-05 MED ORDER — MIDAZOLAM HCL 2 MG/2ML IJ SOLN
INTRAMUSCULAR | Status: AC
  Filled 2022-04-05: qty 2

## 2022-04-05 MED ORDER — SODIUM CHLORIDE 0.9 % IV SOLN: INTRAVENOUS | Status: DC

## 2022-04-05 MED ORDER — MIDAZOLAM HCL 2 MG/2ML IJ SOLN
INTRAMUSCULAR | Status: AC | PRN
  Administered 2022-04-05: 1.5 mg via INTRAVENOUS
  Administered 2022-04-05: 1 mg via INTRAVENOUS
  Administered 2022-04-05: .5 mg via INTRAVENOUS
  Administered 2022-04-05: 1 mg via INTRAVENOUS

## 2022-04-05 MED ORDER — FENTANYL CITRATE (PF) 100 MCG/2ML IJ SOLN
INTRAMUSCULAR | Status: AC
  Filled 2022-04-05: qty 2

## 2022-04-05 MED ORDER — DIPHENHYDRAMINE HCL 50 MG/ML IJ SOLN
INTRAMUSCULAR | Status: AC | PRN
  Administered 2022-04-05: 50 mg via INTRAVENOUS

## 2022-04-05 MED ORDER — DIPHENHYDRAMINE HCL 50 MG/ML IJ SOLN
INTRAMUSCULAR | Status: AC
  Filled 2022-04-05: qty 1

## 2022-04-05 MED ORDER — FENTANYL CITRATE (PF) 100 MCG/2ML IJ SOLN
INTRAMUSCULAR | Status: AC | PRN
  Administered 2022-04-05 (×2): 50 ug via INTRAVENOUS

## 2022-04-05 MED FILL — Dexamethasone Sodium Phosphate Inj 100 MG/10ML: INTRAMUSCULAR | Qty: 1 | Status: AC

## 2022-04-05 NOTE — Procedures (Signed)
Interventional Radiology Procedure Note  Procedure: Single Lumen Power Port Placement    Access:  Right IJ vein.  Findings: Catheter tip positioned at SVC/RA junction. Port is ready for immediate use.   Complications: None  EBL: < 10 mL  Recommendations:  - Ok to shower in 24 hours - Do not submerge for 7 days - Routine line care   Aaniyah Strohm T. Tracey Hermance, M.D Pager:  [phone]   

## 2022-04-05 NOTE — Progress Notes (Signed)
1020 Ice pack applied to right shoulder for pain.  Reported pain  in the right shoulder since he fell and broke his hip in December.

## 2022-04-05 NOTE — H&P (Signed)
Chief Complaint: Patient was seen in consultation today for colorectal cancer  Referring Physician(s): Lincoln Brigham  Supervising Physician: Aletta Edouard  Patient Status: [facility] - Out-pt  History of Present Illness: Joseph Hogan is a 62 y.o. male with past medical history of HTN, arthritis, recently diagnosed with colorectal carcinoma who has plans for upcoming chemotherapy. He is in need of durable venous access.  IR consulted for Port-A-Cath placement.   Joseph Hogan presents to [facility] today in his usual state of health.  He has been NPO.  He does  take blood thinners.  He denies fever, chills, nausea, vomiting, abdominal pain, shortness of breath, dysuria. He does endorse right shoulder pain which has been present for several weeks.  He is aware of the goals of the procedure and is agreeable to proceed. His daughter is available for post-procedure care and transportation today.    Past Medical History:  Diagnosis Date   Arthritis    Hypertension    MRSA (methicillin resistant Staphylococcus aureus)    Pre-diabetes    Rectal carcinoma ([place]) 03/19/2022    Past Surgical History:  Procedure Laterality Date   TOTAL HIP ARTHROPLASTY Left 02/06/2022   Procedure: TOTAL HIP ARTHROPLASTY;  Surgeon: [name], MD;  Location: [facility] ORS;  Service: Orthopedics;  Laterality: Left;    Allergies: Codeine and Lisinopril  Medications: Prior to Admission medications   Medication Sig Start Date End Date Taking? Authorizing Provider  Ascorbic Acid (VITAMIN C) 100 MG tablet Take 100 mg by mouth daily.    [provider]  B Complex-C (B-COMPLEX WITH VITAMIN C) tablet Take 1 tablet by mouth daily.    [provider]  Beta Carotene (VITAMIN A) 25000 UNIT capsule Take 25,000 Units by mouth daily.    [provider]  cholecalciferol (VITAMIN D) 1000 units tablet Take 1,000 Units by mouth daily.    [provider]  ELIQUIS 2.5 MG TABS tablet  Take 2.5 mg by mouth 2 (two) times daily. 02/21/22   [provider]  gabapentin (NEURONTIN) 300 MG capsule Take 1 capsule (300 mg total) by mouth 3 (three) times daily. Patient not taking: Reported on 02/25/2022 02/11/22 03/13/22  Barb Merino, MD  lidocaine-prilocaine (EMLA) cream Apply 1 Application topically as needed. 03/21/22   [name], MD  Multiple Vitamin (MULTIVITAMIN WITH MINERALS) TABS tablet Take 1 tablet by mouth daily.    [provider]  ondansetron (ZOFRAN) 8 MG tablet Take 11.5 tablets (92 mg total) by mouth every 8 (eight) hours as needed for nausea or vomiting. 03/16/22   Dede Query T, PA-C  oxyCODONE (OXY IR/ROXICODONE) 5 MG immediate release tablet Take 1 tablet (5 mg total) by mouth every 4 (four) hours as needed. 04/01/22   Lincoln Brigham, PA-C  prochlorperazine (COMPAZINE) 10 MG tablet Take 1 tablet (10 mg total) by mouth every 6 (six) hours as needed for nausea or vomiting. 03/16/22   Lincoln Brigham, PA-C  vitamin E 180 MG (400 UNITS) capsule Take 400 Units by mouth daily.    [provider]  zinc gluconate 50 MG tablet Take 50 mg by mouth daily.    [provider]     Family History  Problem Relation Age of Onset   Stroke Mother    Hypertension Mother    Heart disease Father    Alzheimer's disease Father    Hypertension Brother    Alcohol abuse Brother    Cancer Maternal Aunt  Heart disease Maternal Aunt    Cancer Maternal Uncle    Heart disease Maternal Uncle    Cancer Paternal Aunt    Heart disease Paternal Aunt    Cancer Paternal Uncle    Heart disease Paternal Uncle    Heart disease Paternal Grandmother    Alzheimer's disease Paternal Grandfather    Stomach cancer Neg Hx    Rectal cancer Neg Hx    Esophageal cancer Neg Hx    Colon cancer Neg Hx     Social History   Socioeconomic History   Marital status: Divorced    Spouse name: Not on file   Number of children: Not on file   Years of education: Not on  file   Highest education level: Not on file  Occupational History   Not on file  Tobacco Use   Smoking status: Never   Smokeless tobacco: Never  Vaping Use   Vaping Use: Never used  Substance and Sexual Activity   Alcohol use: No   Drug use: No   Sexual activity: Not on file  Other Topics Concern   Not on file  Social History Narrative   Not on file   Social Determinants of Health   Financial Resource Strain: Not on file  Food Insecurity: No Food Insecurity (02/04/2022)   Hunger Vital Sign    Worried About Running Out of Food in the Last Year: Never true    Ran Out of Food in the Last Year: Never true  Transportation Needs: No Transportation Needs (02/04/2022)   PRAPARE - Hydrologist (Medical): No    Lack of Transportation (Non-Medical): No  Physical Activity: Not on file  Stress: Not on file  Social Connections: Not on file     Review of Systems: A 12 point ROS discussed and pertinent positives are indicated in the HPI above.  All other systems are negative.  Review of Systems  Constitutional:  Negative for fatigue and fever.  Respiratory:  Negative for cough and shortness of breath.   Cardiovascular:  Negative for chest pain.  Gastrointestinal:  Negative for abdominal pain.  Musculoskeletal:  Negative for back pain.  Psychiatric/Behavioral:  Negative for behavioral problems and confusion.     Vital Signs: BP (!) 154/92   Pulse (!) 105   Temp 98.6 F (37 C) (Oral)   Resp 18   Ht '5\' 10"'$  (1.778 m)   Wt 211 lb (95.7 kg)   SpO2 95%   BMI 30.28 kg/m   Physical Exam Vitals and nursing note reviewed.  Constitutional:      General: He is not in acute distress.    Appearance: Normal appearance. He is normal weight. He is not ill-appearing.  HENT:     Mouth/Throat:     Mouth: Mucous membranes are moist.     Pharynx: Oropharynx is clear.  Cardiovascular:     Rate and Rhythm: Normal rate and regular rhythm.  Pulmonary:      Effort: Pulmonary effort is normal.     Breath sounds: Normal breath sounds.  Abdominal:     General: Abdomen is flat.     Palpations: Abdomen is soft.  Neurological:     General: No focal deficit present.     Mental Status: He is alert and oriented to person, place, and time. Mental status is at baseline.  Psychiatric:        Mood and Affect: Mood normal.        Behavior: Behavior  normal.        Thought Content: Thought content normal.        Judgment: Judgment normal.      MD Evaluation Airway: WNL Heart: WNL Abdomen: WNL Chest/ Lungs: WNL ASA  Classification: 3 Mallampati/Airway Score: Two   Imaging: No results found.  Labs:  CBC: Recent Labs    02/06/22 0311 02/07/22 0853 02/08/22 0408 03/15/22 0952  WBC 11.2* 12.3* 11.5* 9.2  HGB 12.9* 11.5* 10.3* 10.2*  HCT 38.8* 35.0* 31.3* 32.0*  PLT 372 427* 409* 650*    COAGS: Recent Labs    02/03/22 1821  INR 1.2    BMP: Recent Labs    02/04/22 0316 02/06/22 0311 02/07/22 0853 03/15/22 0952  NA 134* 135 138 137  K 4.1 4.2 5.1 4.5  CL 102 100 101 100  CO2 '24 25 27 27  '$ GLUCOSE 145* 131* 209* 148*  BUN 17 22 31* 12  CALCIUM 8.8* 8.8* 8.6* 9.6  CREATININE 0.79 1.07 0.98 0.62  GFRNONAA >60 >60 >60 >60    LIVER FUNCTION TESTS: Recent Labs    02/04/22 0316 02/06/22 0311 03/15/22 0952  BILITOT 0.3  --  0.5  AST 24  --  22  ALT 18  --  14  ALKPHOS 100  --  180*  PROT 6.8  --  7.5  ALBUMIN 3.2* 2.9* 3.4*    TUMOR MARKERS: Recent Labs    03/15/22 [id number]  CEA 11.17*    Assessment and Plan: Patient with past medical history of HTN, arthritis presents with complaint of colorectal carcinoma.  IR consulted for Port-A-Cath placement at the request of Dede Query, PA-C. Case reviewed by Dr. Kathlene Cote who approves patient for procedure.  Patient presents today in their usual state of health.  He has been NPO.   Risks and benefits of image guided port-a-catheter placement was discussed with the  patient including, but not limited to bleeding, infection, pneumothorax, or fibrin sheath development and need for additional procedures.  All of the patient's questions were answered, patient is agreeable to proceed. Consent signed and in chart.   Thank you for this interesting consult.  I greatly enjoyed meeting Joseph Hogan and look forward to participating in their care.  A copy of this report was sent to the requesting provider on this date.  Electronically Signed: Docia Barrier, PA 04/05/2022, 10:45 AM   I spent a total of  30 Minutes   in face to face in clinical consultation, greater than 50% of which was counseling/coordinating care for rectal carcinoma.

## 2022-04-05 NOTE — Discharge Instructions (Signed)
Discharge Instructions:   Please call Interventional Radiology clinic [phone] with any questions or concerns.  You may remove your dressing and shower tomorrow.  Do not use EMLA / Lidocaine cream for 2 weeks post Port Insertion for this will remove the surgical glue.  Moderate Conscious Sedation, Adult, Care After This sheet gives you information about how to care for yourself after your procedure. Your health care provider may also give you more specific instructions. If you have problems or questions, contact your health care provider. What can I expect after the procedure? After the procedure, it is common to have: Sleepiness for several hours. Impaired judgment for several hours. Difficulty with balance. Vomiting if you eat too soon. Follow these instructions at home: For the time period you were told by your health care provider: Rest. Do not participate in activities where you could fall or become injured. Do not drive or use machinery. Do not drink alcohol. Do not take sleeping pills or medicines that cause drowsiness. Do not make important decisions or sign legal documents. Do not take care of children on your own. Eating and drinking  Follow the diet recommended by your health care provider. Drink enough fluid to keep your urine pale yellow. If you vomit: Drink water, juice, or soup when you can drink without vomiting. Make sure you have little or no nausea before eating solid foods. General instructions Take over-the-counter and prescription medicines only as told by your health care provider. Have a responsible adult stay with you for the time you are told. It is important to have someone help care for you until you are awake and alert. Do not smoke. Keep all follow-up visits as told by your health care provider. This is important. Contact a health care provider if: You are still sleepy or having trouble with balance after 24 hours. You feel light-headed. You  keep feeling nauseous or you keep vomiting. You develop a rash. You have a fever. You have redness or swelling around the IV site. Get help right away if: You have trouble breathing. You have new-onset confusion at home. Summary After the procedure, it is common to feel sleepy, have impaired judgment, or feel nauseous if you eat too soon. Rest after you get home. Know the things you should not do after the procedure. Follow the diet recommended by your health care provider and drink enough fluid to keep your urine pale yellow. Get help right away if you have trouble breathing or new-onset confusion at home. This information is not intended to replace advice given to you by your health care provider. Make sure you discuss any questions you have with your health care provider. Document Revised: 05/28/2019 Document Reviewed: 12/24/2018 Elsevier Patient Education  [place] Insertion, Care After The following information offers guidance on how to care for yourself after your procedure. Your health care provider may also give you more specific instructions. If you have problems or questions, contact your health care provider. What can I expect after the procedure? After the procedure, it is common to have: Discomfort at the port insertion site. Bruising on the skin over the port. This should improve over 3-4 days. Follow these instructions at home: [facility] care After your port is placed, you will get a manufacturer's information card. The card has information about your port. Keep this card with you at all times. Take care of the port as told by your health care provider. Ask your health care provider if  you or a family member can get training for taking care of the port at home. A home health care nurse will be be available to help care for the port. Make sure to remember what type of port you have. Incision care     Follow instructions from your health care  provider about how to take care of your port insertion site. Make sure you: Wash your hands with soap and water for at least 20 seconds before and after you change your bandage (dressing). If soap and water are not available, use hand sanitizer. Change your dressing as told by your health care provider. Leave stitches (sutures), skin glue, or adhesive strips in place. These skin closures may need to stay in place for 2 weeks or longer. If adhesive strip edges start to loosen and curl up, you may trim the loose edges. Do not remove adhesive strips completely unless your health care provider tells you to do that. Check your port insertion site every day for signs of infection. Check for: Redness, swelling, or pain. Fluid or blood. Warmth. Pus or a bad smell. Activity Return to your normal activities as told by your health care provider. Ask your health care provider what activities are safe for you. You may have to avoid lifting. Ask your health care provider how much you can safely lift. General instructions Take over-the-counter and prescription medicines only as told by your health care provider. Do not take baths, swim, or use a hot tub until your health care provider approves. Ask your health care provider if you may take showers. You may only be allowed to take sponge baths. If you were given a sedative during the procedure, it can affect you for several hours. Do not drive or operate machinery until your health care provider says that it is safe. Wear a medical alert bracelet in case of an emergency. This will tell any health care providers that you have a port. Keep all follow-up visits. This is important. Contact a health care provider if: You cannot flush your port with saline as directed, or you cannot draw blood from the port. You have a fever or chills. You have redness, swelling, or pain around your port insertion site. You have fluid or blood coming from your port insertion  site. Your port insertion site feels warm to the touch. You have pus or a bad smell coming from the port insertion site. Get help right away if: You have chest pain or shortness of breath. You have bleeding from your port that you cannot control. These symptoms may be an emergency. Get help right away. Call 911. Do not wait to see if the symptoms will go away. Do not drive yourself to the hospital. Summary Take care of the port as told by your health care provider. Keep the manufacturer's information card with you at all times. Change your dressing as told by your health care provider. Contact a health care provider if you have a fever or chills or if you have redness, swelling, or pain around your port insertion site. Keep all follow-up visits. This information is not intended to replace advice given to you by your health care provider. Make sure you discuss any questions you have with your health care provider. Document Revised: 08/01/2020 Document Reviewed: 08/01/2020 Elsevier Patient Education  [place].

## 2022-04-08 ENCOUNTER — Emergency Department (HOSPITAL_COMMUNITY): Payer: Medicare HMO

## 2022-04-08 ENCOUNTER — Inpatient Hospital Stay: Payer: Medicare HMO

## 2022-04-08 ENCOUNTER — Encounter (HOSPITAL_COMMUNITY): Payer: Self-pay | Admitting: Emergency Medicine

## 2022-04-08 ENCOUNTER — Inpatient Hospital Stay (HOSPITAL_BASED_OUTPATIENT_CLINIC_OR_DEPARTMENT_OTHER): Payer: Medicare HMO | Admitting: Physician Assistant

## 2022-04-08 ENCOUNTER — Encounter: Payer: Self-pay | Admitting: Hematology and Oncology

## 2022-04-08 ENCOUNTER — Emergency Department (HOSPITAL_COMMUNITY)
Admission: EM | Admit: 2022-04-08 | Discharge: 2022-04-08 | Disposition: A | Discharge status: Home / Self Care | Payer: Medicare HMO | Attending: Emergency Medicine | Admitting: Emergency Medicine

## 2022-04-08 VITALS — BP 151/90 | HR 108 | Temp 98.1°F | Resp 16

## 2022-04-08 DIAGNOSIS — Z95828 Presence of other vascular implants and grafts: Secondary | ICD-10-CM | POA: Insufficient documentation

## 2022-04-08 DIAGNOSIS — M79603 Pain in arm, unspecified: Secondary | ICD-10-CM | POA: Diagnosis not present

## 2022-04-08 DIAGNOSIS — S42351A Displaced comminuted fracture of shaft of humerus, right arm, initial encounter for closed fracture: Secondary | ICD-10-CM | POA: Diagnosis not present

## 2022-04-08 DIAGNOSIS — C7951 Secondary malignant neoplasm of bone: Secondary | ICD-10-CM | POA: Diagnosis not present

## 2022-04-08 DIAGNOSIS — R000 Tachycardia, unspecified: Secondary | ICD-10-CM | POA: Diagnosis not present

## 2022-04-08 DIAGNOSIS — D649 Anemia, unspecified: Secondary | ICD-10-CM

## 2022-04-08 DIAGNOSIS — R609 Edema, unspecified: Secondary | ICD-10-CM | POA: Diagnosis not present

## 2022-04-08 DIAGNOSIS — M79621 Pain in right upper arm: Secondary | ICD-10-CM | POA: Diagnosis not present

## 2022-04-08 DIAGNOSIS — M25511 Pain in right shoulder: Secondary | ICD-10-CM | POA: Diagnosis not present

## 2022-04-08 DIAGNOSIS — D508 Other iron deficiency anemias: Secondary | ICD-10-CM

## 2022-04-08 DIAGNOSIS — C20 Malignant neoplasm of rectum: Secondary | ICD-10-CM

## 2022-04-08 DIAGNOSIS — S42301A Unspecified fracture of shaft of humerus, right arm, initial encounter for closed fracture: Secondary | ICD-10-CM | POA: Diagnosis not present

## 2022-04-08 DIAGNOSIS — S4991XA Unspecified injury of right shoulder and upper arm, initial encounter: Secondary | ICD-10-CM | POA: Diagnosis present

## 2022-04-08 DIAGNOSIS — I10 Essential (primary) hypertension: Secondary | ICD-10-CM | POA: Diagnosis not present

## 2022-04-08 DIAGNOSIS — C419 Malignant neoplasm of bone and articular cartilage, unspecified: Secondary | ICD-10-CM | POA: Diagnosis not present

## 2022-04-08 DIAGNOSIS — X58XXXA Exposure to other specified factors, initial encounter: Secondary | ICD-10-CM | POA: Insufficient documentation

## 2022-04-08 LAB — CBC WITH DIFFERENTIAL (CANCER CENTER ONLY)
Abs Immature Granulocytes: 0.07 10*3/uL (ref 0.00–0.07)
Basophils Absolute: 0.1 10*3/uL (ref 0.0–0.1)
Basophils Relative: 1 %
Eosinophils Absolute: 0.2 10*3/uL (ref 0.0–0.5)
Eosinophils Relative: 2 %
HCT: 29.5 % — ABNORMAL LOW (ref 39.0–52.0)
Hemoglobin: 9.3 g/dL — ABNORMAL LOW (ref 13.0–17.0)
Immature Granulocytes: 1 %
Lymphocytes Relative: 12 %
Lymphs Abs: 1.1 10*3/uL (ref 0.7–4.0)
MCH: 24.6 pg — ABNORMAL LOW (ref 26.0–34.0)
MCHC: 31.5 g/dL (ref 30.0–36.0)
MCV: 78 fL — ABNORMAL LOW (ref 80.0–100.0)
Monocytes Absolute: 0.8 10*3/uL (ref 0.1–1.0)
Monocytes Relative: 9 %
Neutro Abs: 6.7 10*3/uL (ref 1.7–7.7)
Neutrophils Relative %: 75 %
Platelet Count: 644 10*3/uL — ABNORMAL HIGH (ref 150–400)
RBC: 3.78 MIL/uL — ABNORMAL LOW (ref 4.22–5.81)
RDW: 14.7 % (ref 11.5–15.5)
WBC Count: 8.8 10*3/uL (ref 4.0–10.5)
nRBC: 0 % (ref 0.0–0.2)

## 2022-04-08 LAB — CMP (CANCER CENTER ONLY)
ALT: 28 U/L (ref 0–44)
AST: 23 U/L (ref 15–41)
Albumin: 3.1 g/dL — ABNORMAL LOW (ref 3.5–5.0)
Alkaline Phosphatase: 219 U/L — ABNORMAL HIGH (ref 38–126)
Anion gap: 9 (ref 5–15)
BUN: 11 mg/dL (ref 8–23)
CO2: 28 mmol/L (ref 22–32)
Calcium: 8.6 mg/dL — ABNORMAL LOW (ref 8.9–10.3)
Chloride: 97 mmol/L — ABNORMAL LOW (ref 98–111)
Creatinine: 0.49 mg/dL — ABNORMAL LOW (ref 0.61–1.24)
GFR, Estimated: >60 mL/min (ref >60)
Glucose, Bld: 171 mg/dL — ABNORMAL HIGH (ref 70–99)
Potassium: 4.3 mmol/L (ref 3.5–5.1)
Sodium: 134 mmol/L — ABNORMAL LOW (ref 135–145)
Total Bilirubin: 0.4 mg/dL (ref 0.3–1.2)
Total Protein: 7.4 g/dL (ref 6.5–8.1)

## 2022-04-08 LAB — FERRITIN: Ferritin: 580 ng/mL — ABNORMAL HIGH (ref 24–336)

## 2022-04-08 LAB — IRON AND IRON BINDING CAPACITY (CC-WL,HP ONLY)
Iron: 17 ug/dL — ABNORMAL LOW (ref 45–182)
Saturation Ratios: 9 % — ABNORMAL LOW (ref 17.9–39.5)
TIBC: 197 ug/dL — ABNORMAL LOW (ref 250–450)
UIBC: 180 ug/dL (ref 117–376)

## 2022-04-08 MED ORDER — LEUCOVORIN CALCIUM INJECTION 350 MG
400.0000 mg/m2 | Freq: Once | INTRAVENOUS | Status: AC
  Administered 2022-04-08: 876 mg via INTRAVENOUS
  Filled 2022-04-08: qty 43.8

## 2022-04-08 MED ORDER — FLUOROURACIL CHEMO INJECTION 2.5 GM/50ML
400.0000 mg/m2 | Freq: Once | INTRAVENOUS | Status: AC
  Administered 2022-04-08: 900 mg via INTRAVENOUS
  Filled 2022-04-08: qty 18

## 2022-04-08 MED ORDER — OXALIPLATIN CHEMO INJECTION 100 MG/20ML
85.0000 mg/m2 | Freq: Once | INTRAVENOUS | Status: AC
  Administered 2022-04-08: 200 mg via INTRAVENOUS
  Filled 2022-04-08: qty 40

## 2022-04-08 MED ORDER — PALONOSETRON HCL INJECTION 0.25 MG/5ML
0.2500 mg | Freq: Once | INTRAVENOUS | Status: AC
  Administered 2022-04-08: 0.25 mg via INTRAVENOUS
  Filled 2022-04-08: qty 5

## 2022-04-08 MED ORDER — LIDOCAINE-PRILOCAINE 2.5-2.5 % EX CREA: 1.0000 | TOPICAL_CREAM | CUTANEOUS | 0 refills | Status: DC | PRN

## 2022-04-08 MED ORDER — SODIUM CHLORIDE 0.9 % IV SOLN
2400.0000 mg/m2 | INTRAVENOUS | Status: DC
  Administered 2022-04-08: 5000 mg via INTRAVENOUS
  Filled 2022-04-08: qty 100

## 2022-04-08 MED ORDER — HYDROMORPHONE HCL 1 MG/ML IJ SOLN
2.0000 mg | Freq: Once | INTRAMUSCULAR | Status: AC
  Administered 2022-04-08: 2 mg via INTRAMUSCULAR
  Filled 2022-04-08: qty 2

## 2022-04-08 MED ORDER — SODIUM CHLORIDE 0.9% FLUSH
10.0000 mL | INTRAVENOUS | Status: DC | PRN
  Administered 2022-04-08: 10 mL

## 2022-04-08 MED ORDER — SODIUM CHLORIDE 0.9% FLUSH
10.0000 mL | Freq: Once | INTRAVENOUS | Status: AC
  Administered 2022-04-08: 10 mL

## 2022-04-08 MED ORDER — HEPARIN SOD (PORK) LOCK FLUSH 100 UNIT/ML IV SOLN: 500.0000 [IU] | Freq: Once | INTRAVENOUS | Status: DC | PRN

## 2022-04-08 MED ORDER — SODIUM CHLORIDE 0.9 % IV SOLN
10.0000 mg | Freq: Once | INTRAVENOUS | Status: AC
  Administered 2022-04-08: 10 mg via INTRAVENOUS
  Filled 2022-04-08: qty 10

## 2022-04-08 MED ORDER — DEXTROSE 5 % IV SOLN: Freq: Once | INTRAVENOUS | Status: AC

## 2022-04-08 NOTE — Progress Notes (Signed)
Per Terisa Starr OK to proceed with elevated HR today. HOLD Bevacizumab d/t having his PAC placed yesterday on 04/07/22.

## 2022-04-08 NOTE — ED Provider Notes (Signed)
[facility] Provider Note   CSN: [id number] Arrival date & time: 04/08/22  1726     History  No chief complaint on file.   Joseph Hogan is a 62 y.o. male.  HPI   This patient is an unfortunate 62 year old male who presents to the hospital today after developing acute onset of pain in his mid right arm after trying to turn a doorknob.  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  He then followed up with the cancer clinic and was found to have a rectal cancer on a colonoscopy.  Today was his first day of chemotherapy, he tried to turn the doorknob when he got home and felt a pop and has had significant pain since that time.  He is taking oxycodone 5 mg 3 times a day for his pain and took several earlier today but his pain is still present.  He has no numbness or weakness in the hand  Home Medications Prior to Admission medications   Medication Sig Start Date End Date Taking? Authorizing Provider  Ascorbic Acid (VITAMIN C) 100 MG tablet Take 100 mg by mouth daily.    [provider]  B Complex-C (B-COMPLEX WITH VITAMIN C) tablet Take 1 tablet by mouth daily.    [provider]  Beta Carotene (VITAMIN A) 25000 UNIT capsule Take 25,000 Units by mouth daily.    [provider]  cholecalciferol (VITAMIN D) 1000 units tablet Take 1,000 Units by mouth daily.    [provider]  ELIQUIS 2.5 MG TABS tablet Take 2.5 mg by mouth 2 (two) times daily. Patient not taking: Reported on 04/08/2022 02/21/22   [provider]  gabapentin (NEURONTIN) 300 MG capsule Take 1 capsule (300 mg total) by mouth 3 (three) times daily. Patient not taking: Reported on 02/25/2022 02/11/22 03/13/22  Barb Merino, MD  lidocaine-prilocaine (EMLA) cream Apply 1 Application topically as needed. 04/08/22   Lincoln Brigham, PA-C  Multiple Vitamin (MULTIVITAMIN WITH  MINERALS) TABS tablet Take 1 tablet by mouth daily.    [provider]  ondansetron (ZOFRAN) 8 MG tablet Take 11.5 tablets (92 mg total) by mouth every 8 (eight) hours as needed for nausea or vomiting. 03/16/22   Dede Query T, PA-C  oxyCODONE (OXY IR/ROXICODONE) 5 MG immediate release tablet Take 1 tablet (5 mg total) by mouth every 4 (four) hours as needed. 04/01/22   Lincoln Brigham, PA-C  prochlorperazine (COMPAZINE) 10 MG tablet Take 1 tablet (10 mg total) by mouth every 6 (six) hours as needed for nausea or vomiting. 03/16/22   Lincoln Brigham, PA-C  vitamin E 180 MG (400 UNITS) capsule Take 400 Units by mouth daily.    [provider]  zinc gluconate 50 MG tablet Take 50 mg by mouth daily.    [provider]      Allergies    Codeine and Lisinopril    Review of Systems   Review of Systems  All other systems reviewed and are negative.   Physical Exam Updated Vital Signs BP (!) 147/85 (BP Location: Right Arm)   Pulse (!) 116   Temp (!) 97.2 F (36.2 C) (Tympanic)   Resp 16   SpO2 98%  Physical Exam Vitals and nursing note reviewed.  Constitutional:      General: He is not in acute distress.    Appearance: He is well-developed.  HENT:  Head: Normocephalic and atraumatic.     Mouth/Throat:     Pharynx: No oropharyngeal exudate.  Eyes:     General: No scleral icterus.       Right eye: No discharge.        Left eye: No discharge.     Conjunctiva/sclera: Conjunctivae normal.     Pupils: Pupils are equal, round, and reactive to light.  Neck:     Thyroid: No thyromegaly.     Vascular: No JVD.  Cardiovascular:     Rate and Rhythm: Regular rhythm. Tachycardia present.     Heart sounds: Normal heart sounds. No murmur heard.    No friction rub. No gallop.  Pulmonary:     Effort: Pulmonary effort is normal. No respiratory distress.     Breath sounds: Normal breath sounds. No wheezing or rales.  Abdominal:     General: Bowel sounds are normal.  There is no distension.     Palpations: Abdomen is soft. There is no mass.     Tenderness: There is no abdominal tenderness.  Musculoskeletal:        General: Tenderness and deformity present.     Cervical back: Normal range of motion and neck supple.     Comments: Extremities without significant deformity except for the right upper extremity where there is tenderness in the mid right arm.  There is normal pulses and sensation distally.  He is able to move the elbow but with some pain because of the arm  Lymphadenopathy:     Cervical: No cervical adenopathy.  Skin:    General: Skin is warm and dry.     Findings: No erythema or rash.  Neurological:     Mental Status: He is alert.     Coordination: Coordination normal.  Psychiatric:        Behavior: Behavior normal.     ED Results / Procedures / Treatments   Labs (all labs ordered are listed, but only abnormal results are displayed) Labs Reviewed - No data to display  EKG None  Radiology DG Shoulder Right  Result Date: 04/08/2022 CLINICAL DATA:  Stage IV bone cancer. Pleuritic pop and opening a door. Large swollen knot area in biceps. Patient reports he has had this pain for weeks. EXAM: RIGHT SHOULDER - 2+ VIEW COMPARISON:  Right shoulder radiographs 12/06/2011 FINDINGS: Moderate glenohumeral joint space narrowing and inferior chronic degenerative osteophytosis. Mild acromioclavicular joint space narrowing and peripheral osteophytosis. Moderate lateral downsloping of the acromion. There are mottled lucencies within the proximal to mid humeral diaphysis along an approximate 9 cm length. There appears to be an oblique linear lucency extending in a superomedial to inferolateral orientation within the distal aspect of this permeative destructive lesion suggesting an acute to subacute fracture. There is mild anterior apex angulation of the fracture line on lateral view. Right chest wall porta catheter is partially visualized. IMPRESSION:  Permeative, destructive lesion within the proximal to mid right humeral shaft and associated acute to subacute pathologic fracture without significant displacement but with mild anterior apex angulation. Electronically Signed   By: Yvonne Kendall M.D.   On: 04/08/2022 18:34    Procedures Procedures    Medications Ordered in ED Medications  HYDROmorphone (DILAUDID) injection 2 mg (2 mg Intramuscular Given 04/08/22 2049)    ED Course/ Medical Decision Making/ A&P                             Medical Decision  Making Amount and/or Complexity of Data Reviewed Radiology: ordered.  Risk Prescription drug management.   I personally viewed and interpreted the x-ray of the humerus which appears to have a pathological metastatic lesion in the middle of the humerus, there does appear to be a fracture with some angulation in the middle there.  I discussed this with the patient, he is aware of the findings.  I do not see any results of the PET scan in the patient's chart.  They think that he had 1 when he was at [place] [facility] but I do not see any results.  He has follow-up with orthopedics tomorrow, he will be given intramuscular hydromorphone, he has oxycodone at home, he will follow-up with orthopedics tomorrow, he will be placed in a sling and immobilizer prior to discharge.  Patient is agreeable to the plan, family at the bedside also agreeable  I personally viewed and interpreted the x-ray which shows that the patient does have a fracture of his mid humerus.  He was immobilized given pain medication and improved.  Has follow-up with orthopedics tomorrow, stable for discharge        Final Clinical Impression(s) / ED Diagnoses Final diagnoses:  Closed fracture of shaft of right humerus, unspecified fracture morphology, initial encounter    Rx / DC Orders ED Discharge Orders     None         Noemi Chapel, MD 04/08/22 2119

## 2022-04-08 NOTE — Progress Notes (Signed)
[facility] [phone]   [phone]) [phone]  PROGRESS NOTE  Patient Care Team: Leonie Douglas, MD as PCP - General (Family Medicine)  CHIEF COMPLAINTS/PURPOSE OF CONSULTATION:  Metastatic rectal carcinoma  ONCOLOGIC HISTORY: 02/03/2022-02/11/2022: Presented to ED due to left hip pain after a mechanical fall.  02/04/2022: CT left [id number] pathologic fracture of the left femoral neck with displacement and angulation, with underlying lytic lesions in the femoral head and neck.Additional lytic destructive lesion of the left parasymphyseal pubic bone extending throughout the superior pubic ramus to the puboacetabular junction, with pathologic fracture of the superior pubic ramus. Adjacent hypoattenuation in the pelvic component of the obturator internus muscle, suspicious for tumor involvement. Probable small lytic lesion in the inferior pubic ramus.Prominent left external iliac lymph nodes measuring up to 1.2 cm. 02/05/2022: CT CAP: Pathologic retroperitoneal and bilateral pelvic adenopathy, with lytic destructive lesions of the left superior and inferior pubic ramus and pubic body as well as a pathologic fracture through the left femoral neck. Pathologic fracture anteriorly in the right second rib. Deformity anteriorly in the right third rib probably from early pathologic fracture. Metastatic disease or myeloma strongly favored over multifocal osteomyelitis as a cause. Mildly enlarged left supraclavicular lymph node and lower thoracic periaortic lymph node. Pathologic retroperitoneal and pelvic adenopathy. 10 cm long segment of rectal wall thickening.1.1 cm exophytic lesion from the left mid kidney posterolaterally, nonspecific for complex cyst versus tumor. Bandlike atelectasis in both lungs.Small type 1 hiatal hernia.Lumbar spondylosis and degenerative disc disease causing generally mild multilevel impingement. Sigmoid colon diverticulosis.Wall thickening in the  proximal stomach body, probably secondary to nondistention. 02/06/2022: Underwent left total hip arthroplasty. Pathology revealed poorly differentiated carcinoma.  SPEP/IFE did not detect monoclonal protein. PSA normal. 02/25/2022: Underwent colonoscopy that showed malignant tumor in the rectum. Pathology confirmed poorly differentiated carcinoma.  04/05/2022: Underwent port placement 04/08/2022: Started Cycle 1, Day 1 of FOLFOX (held bevacizumab due to recent port placement)  HISTORY OF PRESENTING ILLNESS:  Joseph Hogan 62 y.o. male returns for follow-up for metastatic rectal cancer. He presents today to start Cycle 1, Day 1 of FOLFOX. He is accompanied by his daughter for this visit.   On exam today, [name] reports that he completed physical therapy last week. He is still working on improving his strength and mobility. He reports neuropathic pain of his right lower extremity has greatly improved with gabapentin. He still has right shoulder pain with decreased mobility. He takes oxycodone 5-10 mg every 4 hours with improvement of right shoulder pain. He reports his appetite is stable. He denies nausea, vomiting or abdominal pain. His bowel habits are unchanged without recurrent episodes of diarrhea or constipation. He denies easy bruising or signs of active bleeding.   He denies fevers, chills, night sweats, shortness of breath or cough.  He has no other complaints.  Rest of the 10 point ROS is below.  MEDICAL HISTORY:  Past Medical History:  Diagnosis Date   Arthritis    Hypertension    MRSA (methicillin resistant Staphylococcus aureus)    Pre-diabetes    Rectal carcinoma ([place]) 03/19/2022    SURGICAL HISTORY: Past Surgical History:  Procedure Laterality Date   IR IMAGING GUIDED PORT INSERTION  04/05/2022   TOTAL HIP ARTHROPLASTY Left 02/06/2022   Procedure: TOTAL HIP ARTHROPLASTY;  Surgeon: [name], MD;  Location: [facility];  Service: Orthopedics;  Laterality: Left;     SOCIAL HISTORY: Social History   Socioeconomic History   Marital status: Divorced  Spouse name: Not on file   Number of children: Not on file   Years of education: Not on file   Highest education level: Not on file  Occupational History   Not on file  Tobacco Use   Smoking status: Never   Smokeless tobacco: Never  Vaping Use   Vaping Use: Never used  Substance and Sexual Activity   Alcohol use: No   Drug use: No   Sexual activity: Not on file  Other Topics Concern   Not on file  Social History Narrative   Not on file   Social Determinants of Health   Financial Resource Strain: Not on file  Food Insecurity: No Food Insecurity (02/04/2022)   Hunger Vital Sign    Worried About Running Out of Food in the Last Year: Never true    Ran Out of Food in the Last Year: Never true  Transportation Needs: No Transportation Needs (02/04/2022)   PRAPARE - Hydrologist (Medical): No    Lack of Transportation (Non-Medical): No  Physical Activity: Not on file  Stress: Not on file  Social Connections: Not on file  Intimate Partner Violence: Not At Risk (02/04/2022)   Humiliation, Afraid, Rape, and Kick questionnaire    Fear of Current or Ex-Partner: No    Emotionally Abused: No    Physically Abused: No    Sexually Abused: No    FAMILY HISTORY: Family History  Problem Relation Age of Onset   Stroke Mother    Hypertension Mother    Heart disease Father    Alzheimer's disease Father    Hypertension Brother    Alcohol abuse Brother    Cancer Maternal Aunt    Heart disease Maternal Aunt    Cancer Maternal Uncle    Heart disease Maternal Uncle    Cancer Paternal Aunt    Heart disease Paternal Aunt    Cancer Paternal Uncle    Heart disease Paternal Uncle    Heart disease Paternal Grandmother    Alzheimer's disease Paternal Grandfather    Stomach cancer Neg Hx    Rectal cancer Neg Hx    Esophageal cancer Neg Hx    Colon cancer Neg Hx      ALLERGIES:  is allergic to codeine and lisinopril.  MEDICATIONS:  Current Outpatient Medications  Medication Sig Dispense Refill   Ascorbic Acid (VITAMIN C) 100 MG tablet Take 100 mg by mouth daily.     B Complex-C (B-COMPLEX WITH VITAMIN C) tablet Take 1 tablet by mouth daily.     Beta Carotene (VITAMIN A) 25000 UNIT capsule Take 25,000 Units by mouth daily.     cholecalciferol (VITAMIN D) 1000 units tablet Take 1,000 Units by mouth daily.     Multiple Vitamin (MULTIVITAMIN WITH MINERALS) TABS tablet Take 1 tablet by mouth daily.     ondansetron (ZOFRAN) 8 MG tablet Take 11.5 tablets (92 mg total) by mouth every 8 (eight) hours as needed for nausea or vomiting. 90 tablet 0   oxyCODONE (OXY IR/ROXICODONE) 5 MG immediate release tablet Take 1 tablet (5 mg total) by mouth every 4 (four) hours as needed. 90 tablet 0   prochlorperazine (COMPAZINE) 10 MG tablet Take 1 tablet (10 mg total) by mouth every 6 (six) hours as needed for nausea or vomiting. 90 tablet 0   vitamin E 180 MG (400 UNITS) capsule Take 400 Units by mouth daily.     zinc gluconate 50 MG tablet Take 50 mg by mouth  daily.     ELIQUIS 2.5 MG TABS tablet Take 2.5 mg by mouth 2 (two) times daily. (Patient not taking: Reported on 04/08/2022)     gabapentin (NEURONTIN) 300 MG capsule Take 1 capsule (300 mg total) by mouth 3 (three) times daily. (Patient not taking: Reported on 02/25/2022) 90 capsule 0   lidocaine-prilocaine (EMLA) cream Apply 1 Application topically as needed. 30 g 0   No current facility-administered medications for this visit.   Facility-Administered Medications Ordered in Other Visits  Medication Dose Route Frequency Provider Last Rate Last Admin   fluorouracil (ADRUCIL) 5,000 mg in sodium chloride 0.9 % 150 mL chemo infusion  2,400 mg/m2 (Treatment Plan Recorded) Intravenous 1 day or 1 dose [name], MD   Infusion Verify at 04/08/22 1536   heparin lock flush 100 unit/mL  500 Units Intracatheter Once  PRN [name], MD       sodium chloride flush (NS) 0.9 % injection 10 mL  10 mL Intracatheter PRN [name], MD   10 mL at 04/08/22 1514    REVIEW OF SYSTEMS:   Constitutional: ( - ) fevers, ( - )  chills , ( - ) night sweats Eyes: ( - ) blurriness of vision, ( - ) double vision, ( - ) watery eyes Ears, nose, mouth, throat, and face: ( - ) mucositis, ( - ) sore throat Respiratory: ( - ) cough, ( - ) dyspnea, ( - ) wheezes Cardiovascular: ( - ) palpitation, ( - ) chest discomfort, ( - ) lower extremity swelling Gastrointestinal:  ( - ) nausea, ( - ) heartburn, ( - ) change in bowel habits Skin: ( - ) abnormal skin rashes Lymphatics: ( - ) new lymphadenopathy, ( - ) easy bruising Neurological: ( - ) numbness, ( - ) tingling, ( - ) new weaknesses Behavioral/Psych: ( - ) mood change, ( - ) new changes  All other systems were reviewed with the patient and are negative.  PHYSICAL EXAMINATION: ECOG PERFORMANCE STATUS: 1 - Symptomatic but completely ambulatory  Vitals:   04/08/22 1053  BP: (!) 151/90  Pulse: (!) 108  Resp: 16  Temp: 98.1 F (36.7 C)  SpO2: 98%   There were no vitals filed for this visit.   GENERAL: well appearing male in NAD. Exam performed in wheelchair.  SKIN: skin color, texture, turgor are normal, no rashes or significant lesions EYES: conjunctiva are pink and non-injected, sclera clear LUNGS: clear to auscultation and percussion with normal breathing effort HEART: regular rate & rhythm and no murmurs and no lower extremity edema Musculoskeletal: no cyanosis of digits and no clubbing  PSYCH: alert & oriented x 3, fluent speech NEURO: no focal motor/sensory deficits  LABORATORY DATA:  I have reviewed the data as listed    Latest Ref Rng & Units 04/08/2022   10:11 AM 03/15/2022    9:52 AM 02/08/2022    4:08 AM  CBC  WBC 4.0 - 10.5 K/uL 8.8  9.2  11.5   Hemoglobin 13.0 - 17.0 g/dL 9.3  10.2  10.3   Hematocrit 39.0 - 52.0 % 29.5  32.0  31.3    Platelets 150 - 400 K/uL 644  650  409        Latest Ref Rng & Units 04/08/2022   10:11 AM 03/15/2022    9:52 AM 02/07/2022    8:53 AM  CMP  Glucose 70 - 99 mg/dL 171  148  209   BUN  8 - 23 mg/dL '11  12  31   '$ Creatinine 0.61 - 1.24 mg/dL 0.49  0.62  0.98   Sodium 135 - 145 mmol/L 134  137  138   Potassium 3.5 - 5.1 mmol/L 4.3  4.5  5.1   Chloride 98 - 111 mmol/L 97  100  101   CO2 22 - 32 mmol/L '28  27  27   '$ Calcium 8.9 - 10.3 mg/dL 8.6  9.6  8.6   Total Protein 6.5 - 8.1 g/dL 7.4  7.5    Total Bilirubin 0.3 - 1.2 mg/dL 0.4  0.5    Alkaline Phos 38 - 126 U/L 219  180    AST 15 - 41 U/L 23  22    ALT 0 - 44 U/L 28  14       PATHOLOGY: Rectum, biopsy POORLY DIFFERENTIATED CARCINOMA. SEE NOTE. Diagnosis Note Addendum: Immunohistochemistry shows the carcinoma is positive with MOC-31, cytokeratin 20 and CDX2. Tumor is negative with cytokeratin 7, cytokeratin 5/6, p40, TTF-1, Napsin A, prostate-specific antigen and prostein. The immunophenotype is consistent with primary colorectal adenocarcinoma.  RADIOGRAPHIC STUDIES: I have personally reviewed the radiological images as listed and agreed with the findings in the report. IR IMAGING GUIDED PORT INSERTION  Result Date: 04/05/2022 CLINICAL DATA:  Metastatic colorectal carcinoma and need for porta cath for chemotherapy. EXAM: IMPLANTED PORT A CATH PLACEMENT WITH ULTRASOUND AND FLUOROSCOPIC GUIDANCE ANESTHESIA/SEDATION: Moderate (conscious) sedation was employed during this procedure. A total of Versed 4.0 mg and Fentanyl 100 mcg was administered intravenously. Moderate Sedation Time: 40 minutes. The patient's level of consciousness and vital signs were monitored continuously by radiology nursing throughout the procedure under my direct supervision. FLUOROSCOPY: 5.0 mGy PROCEDURE: The procedure, risks, benefits, and alternatives were explained to the patient. Questions regarding the procedure were encouraged and answered. The patient  understands and consents to the procedure. A time-out was performed prior to initiating the procedure. Ultrasound was utilized to confirm patency of the right internal jugular vein. The right neck and chest were prepped with chlorhexidine in a sterile fashion, and a sterile drape was applied covering the operative field. Maximum barrier sterile technique with sterile gowns and gloves were used for the procedure. Local anesthesia was provided with 1% lidocaine. After creating a small venotomy incision, a 21 gauge needle was advanced into the right internal jugular vein under direct, real-time ultrasound guidance. Ultrasound image documentation was performed. After securing guidewire access, an 8 Fr dilator was placed. A J-wire was kinked to measure appropriate catheter length. A subcutaneous port pocket was then created along the upper chest wall utilizing sharp and blunt dissection. Portable cautery was utilized. The pocket was irrigated with sterile saline. A single lumen power injectable port was chosen for placement. The 8 Fr catheter was tunneled from the port pocket site to the venotomy incision. The port was placed in the pocket. External catheter was trimmed to appropriate length based on guidewire measurement. At the venotomy, an 8 Fr peel-away sheath was placed over a guidewire. The catheter was then placed through the sheath and the sheath removed. Final catheter positioning was confirmed and documented with a fluoroscopic spot image. The port was accessed with a needle and aspirated and flushed with heparinized saline. The access needle was removed. The venotomy and port pocket incisions were closed with subcutaneous 3-0 Monocryl and subcuticular 4-0 Vicryl. Dermabond was applied to both incisions. COMPLICATIONS: COMPLICATIONS None FINDINGS: After catheter placement, the tip lies at the cavo-atrial junction. The  catheter aspirates normally and is ready for immediate use. IMPRESSION: Placement of single  lumen port a cath via right internal jugular vein. The catheter tip lies at the cavo-atrial junction. A power injectable port a cath was placed and is ready for immediate use. Electronically Signed   By: Aletta Edouard M.D.   On: 04/05/2022 16:46    ASSESSMENT & PLAN OLOF FADELY is a 62 y.o. male who presents to the clinic for a follow up for rectal carcinoma.   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  --We reviewed that his cancer is incurable and mainstay treatment is chemotherapy. --Recommend chemotherapy regimen FOLFOX plus Bevacizumab q 2 weeks.  PLAN: --Due to start Cycle 1, Day 1 of FOLFOX today --Labs from today reviewed and adequate for treatment.  --Proceed with treatment today but hold Bevacizumab due to recent port placement --RTC in 2 weeks for labs and follow up before Cycle 2, Day 1.  --Patient requested to transfer to [facility] in the future since patient lives in [place].  #Microcytic anemia: #Thrombocytosis: --Labs today show Hgb 9.3, MCV 78.0, Plt 644 --Iron panel confirmed iron deficiency.  --We will arrange for IV venofer 200 mg once a week x 5 doses.  #Right shoulder pain: --Etiology unknown but differentials including osseous metastases. --patient is scheduled for MRI right shoulder tomorrow. --Currently taking oxycodone 5 mg PO q 4-6 hours as needed.  --If there is confirmed metastatic disease, we will make referral to palliative radiation.  #Right lower extremity neuropathic pain: --Pain well controlled with gabapentin 200 mg TID  #Supportive Care -- chemotherapy education complete -- port placement complete.  -- zofran '8mg'$  q8H PRN and compazine '10mg'$  PO q6H for nausea -- EMLA cream for port  No orders of the defined types were placed in  this encounter.   All questions were answered. The patient knows to call the clinic with any problems, questions or concerns.  I have spent a total of 30 minutes minutes of face-to-face and non-face-to-face time, preparing to see the patient, performing a medically appropriate examination, counseling and educating the patient, ordering medications/tests/procedures,  documenting clinical information in the electronic health record,  and care coordination.   Dede Query, PA-C Department of Hematology/Oncology [facility] Phone: [phone]

## 2022-04-08 NOTE — Discharge Instructions (Signed)
I would like you to follow-up with your orthopedic doctor tomorrow at your appointment.  If you have increasing pain this evening you may take 2 oxycodone tablets every 6 hours as needed but do not take your next dose until after midnight.  This will likely help the pain but not make it go away.  Emergency department for severe or worsening symptoms

## 2022-04-08 NOTE — Patient Instructions (Addendum)
[facility]  Discharge Instructions: Thank you for choosing [facility] to provide your oncology and hematology care.   If you have a lab appointment with the [facility], please go directly to the [facility] and check in at the registration area.   Wear comfortable clothing and clothing appropriate for easy access to any Portacath or PICC line.   We strive to give you quality time with your provider. You may need to reschedule your appointment if you arrive late (15 or more minutes).  Arriving late affects you and other patients whose appointments are after yours.  Also, if you miss three or more appointments without notifying the office, you may be dismissed from the clinic at the provider's discretion.      For prescription refill requests, have your pharmacy contact our office and allow 72 hours for refills to be completed.    Today you received the following chemotherapy and/or immunotherapy agents Oxaliplatin, Leucovorin, Fluorouracil.      To help prevent nausea and vomiting after your treatment, we encourage you to take your nausea medication as directed.  BELOW ARE SYMPTOMS THAT SHOULD BE REPORTED IMMEDIATELY: *FEVER GREATER THAN 100.4 F (38 C) OR HIGHER *CHILLS OR SWEATING *NAUSEA AND VOMITING THAT IS NOT CONTROLLED WITH YOUR NAUSEA MEDICATION *UNUSUAL SHORTNESS OF BREATH *UNUSUAL BRUISING OR BLEEDING *URINARY PROBLEMS (pain or burning when urinating, or frequent urination) *BOWEL PROBLEMS (unusual diarrhea, constipation, pain near the anus) TENDERNESS IN MOUTH AND THROAT WITH OR WITHOUT PRESENCE OF ULCERS (sore throat, sores in mouth, or a toothache) UNUSUAL RASH, SWELLING OR PAIN  UNUSUAL VAGINAL DISCHARGE OR ITCHING   Items with * indicate a potential emergency and should be followed up as soon as possible or go to the Emergency Department if any problems should occur.  Please show the CHEMOTHERAPY ALERT CARD or  IMMUNOTHERAPY ALERT CARD at check-in to the Emergency Department and triage nurse.  Should you have questions after your visit or need to cancel or reschedule your appointment, please contact [facility]  Dept: [phone]  and follow the prompts.  Office hours are 8:00 a.m. to 4:30 p.m. Monday - Friday. Please note that voicemails left after 4:00 p.m. may not be returned until the following business day.  We are closed weekends and major holidays. You have access to a nurse at all times for urgent questions. Please call the main number to the clinic Dept: [phone] and follow the prompts.   For any non-urgent questions, you may also contact your provider using MyChart. We now offer e-Visits for anyone 73 and older to request care online for non-urgent symptoms. For details visit [email or web address].   Also download the MyChart app! Go to the app store, search "MyChart", open the app, select [facility], and log in with your MyChart username and password.  The chemotherapy medication bag should finish at 46 hours, 96 hours, or 7 days. For example, if your pump is scheduled for 46 hours and it was put on at 4:00 p.m., it should finish at 2:00 p.m. the day it is scheduled to come off regardless of your appointment time.     Estimated time to finish at 1:30pm on April 10, 2022.   If the display on your pump reads "Low Volume" and it is beeping, take the batteries out of the pump and come to the cancer center for it to be taken off.   If the pump  alarms go off prior to the pump reading "Low Volume" then call [phone] and someone can assist you.  If the plunger comes out and the chemotherapy medication is leaking out, please use your home chemo spill kit to clean up the spill. Do NOT use paper towels or other household products.  If you have problems or questions regarding your pump, please call either [phone] (24 hours a day) or the cancer  center Monday-Friday 8:00 a.m.- 4:30 p.m. at the clinic number and we will assist you. If you are unable to get assistance, then go to the nearest Emergency Department and ask the staff to contact the IV team for assistance.

## 2022-04-08 NOTE — ED Triage Notes (Addendum)
Pt has stage 4 bone cancer. Went to open his door and heard a pop. He took at home pain medicine. Receiving chemo treatment currently. R arm has large swollen knot area on bicep. Pt reports that he has had this pain for weeks and has MRI to access tomorrow. He has been taking leftover pain pills from hip break. He states he cannot take pain after this incidence. Pt took 5 mg oxy [id number] but pain is still 10/10.

## 2022-04-09 ENCOUNTER — Encounter: Payer: Self-pay | Admitting: Hematology and Oncology

## 2022-04-09 ENCOUNTER — Ambulatory Visit (HOSPITAL_COMMUNITY): Admission: RE | Admit: 2022-04-09 | Payer: Medicare HMO | Source: Ambulatory Visit

## 2022-04-09 ENCOUNTER — Encounter: Payer: Self-pay | Admitting: Physician Assistant

## 2022-04-09 DIAGNOSIS — S72042A Displaced fracture of base of neck of left femur, initial encounter for closed fracture: Secondary | ICD-10-CM | POA: Diagnosis not present

## 2022-04-09 DIAGNOSIS — M25511 Pain in right shoulder: Secondary | ICD-10-CM | POA: Diagnosis not present

## 2022-04-09 NOTE — Progress Notes (Signed)
Per Dede Query, PA-C request.  [employer] [facility] testing ordered via [facility] online portal.  Order Number: [id number].

## 2022-04-09 NOTE — Telephone Encounter (Signed)
-----   Message from Willis Modena, RN sent at 04/08/2022  3:37 PM EST ----- Regarding: Dr. [name] 1st time Folfox Dr. [name] 1st time FOLFOX Pt tol tx well without incident. Pt call back due.

## 2022-04-09 NOTE — Telephone Encounter (Signed)
Called & left message for pt to return call to let us know how he is doing since start of treatment.

## 2022-04-09 NOTE — Progress Notes (Signed)
Called pt to introduce myself as his Arboriculturist and to discuss the [employer].  Pt would like to apply but wanted to discuss moving his appointments to [facility] since he's 10 mins from that facility so I contacted [facility] in scheduling requesting she call the pt to discuss moving locations.  She sd she would call him.  If he stays at the [facility] location he will bring his proof of income to apply for the grant.  If approved I will give him an expense sheet and my card for any questions or concerns he may have in the future.

## 2022-04-10 ENCOUNTER — Other Ambulatory Visit: Payer: Self-pay | Admitting: Physician Assistant

## 2022-04-10 ENCOUNTER — Encounter (HOSPITAL_COMMUNITY): Payer: Self-pay | Admitting: Orthopaedic Surgery

## 2022-04-10 ENCOUNTER — Telehealth: Payer: Self-pay | Admitting: Physician Assistant

## 2022-04-10 ENCOUNTER — Other Ambulatory Visit: Payer: Self-pay

## 2022-04-10 ENCOUNTER — Inpatient Hospital Stay: Payer: Medicare HMO

## 2022-04-10 VITALS — BP 133/82 | HR 108 | Temp 98.9°F | Resp 20

## 2022-04-10 DIAGNOSIS — I10 Essential (primary) hypertension: Secondary | ICD-10-CM | POA: Diagnosis not present

## 2022-04-10 DIAGNOSIS — D509 Iron deficiency anemia, unspecified: Secondary | ICD-10-CM | POA: Diagnosis not present

## 2022-04-10 DIAGNOSIS — C772 Secondary and unspecified malignant neoplasm of intra-abdominal lymph nodes: Secondary | ICD-10-CM | POA: Diagnosis not present

## 2022-04-10 DIAGNOSIS — G893 Neoplasm related pain (acute) (chronic): Secondary | ICD-10-CM | POA: Diagnosis not present

## 2022-04-10 DIAGNOSIS — C20 Malignant neoplasm of rectum: Secondary | ICD-10-CM

## 2022-04-10 DIAGNOSIS — M25511 Pain in right shoulder: Secondary | ICD-10-CM | POA: Diagnosis not present

## 2022-04-10 DIAGNOSIS — D75839 Thrombocytosis, unspecified: Secondary | ICD-10-CM | POA: Diagnosis not present

## 2022-04-10 DIAGNOSIS — C7951 Secondary malignant neoplasm of bone: Secondary | ICD-10-CM | POA: Diagnosis not present

## 2022-04-10 DIAGNOSIS — Z5111 Encounter for antineoplastic chemotherapy: Secondary | ICD-10-CM | POA: Diagnosis not present

## 2022-04-10 DIAGNOSIS — Z95828 Presence of other vascular implants and grafts: Secondary | ICD-10-CM

## 2022-04-10 MED ORDER — SODIUM CHLORIDE 0.9% FLUSH
10.0000 mL | Freq: Once | INTRAVENOUS | Status: AC
  Administered 2022-04-10: 10 mL

## 2022-04-10 MED ORDER — OXYCODONE HCL ER 10 MG PO T12A: 10.0000 mg | EXTENDED_RELEASE_TABLET | Freq: Two times a day (BID) | ORAL | 0 refills | Status: DC

## 2022-04-10 MED ORDER — HEPARIN SOD (PORK) LOCK FLUSH 100 UNIT/ML IV SOLN
500.0000 [IU] | Freq: Once | INTRAVENOUS | Status: AC
  Administered 2022-04-10: 500 [IU]

## 2022-04-10 NOTE — Telephone Encounter (Signed)
Daughter called to let us know patient would be about 20 min late.

## 2022-04-10 NOTE — Progress Notes (Signed)
For Anesthesia: Oncologist - [name], MD  Cardiologist - N/A  Chest x-ray - greater than 1 year in [facility] CT Chest- 02/05/22 in [facility] EKG - 02/03/22 in [facility] Stress Test - N/A ECHO - N/A Cardiac Cath - N/A Pacemaker/ICD device last checked: N/A Pacemaker orders received: N/A Device Rep notified: N/A  Spinal Cord Stimulator: N/A  Sleep Study - N/A CPAP - N/A  Fasting Blood Sugar - N/A Checks Blood Sugar __N/A___ times a day Date and result of last Hgb A1c-N/A  Last dose of GLP1 agonist- N/A GLP1 instructions: N/A  Last dose of SGLT-2 inhibitors- N/A SGLT-2 instructions:N/A  Blood Thinner Instructions:N/A Aspirin Instructions:N/A Last Dose:N/A  Activity level: Can go up a flight of stairs and activities of daily living without stopping and without chest pain and/or shortness of breath     Anesthesia review: abnormal R wave progression noted on EKG 02/03/22  Patient denies shortness of breath, fever, cough and chest pain at PAT appointment   Patient verbalized understanding of instructions reviewed via telephone.

## 2022-04-11 ENCOUNTER — Telehealth: Payer: Self-pay | Admitting: Physician Assistant

## 2022-04-11 ENCOUNTER — Other Ambulatory Visit: Payer: Self-pay | Admitting: Physician Assistant

## 2022-04-11 ENCOUNTER — Telehealth: Payer: Self-pay

## 2022-04-11 ENCOUNTER — Telehealth: Payer: Self-pay | Admitting: Radiation Oncology

## 2022-04-11 ENCOUNTER — Encounter: Payer: Self-pay | Admitting: Radiology

## 2022-04-11 MED ORDER — XTAMPZA ER 9 MG PO C12A: 9.0000 mg | EXTENDED_RELEASE_CAPSULE | Freq: Two times a day (BID) | ORAL | 0 refills | Status: DC | PRN

## 2022-04-11 NOTE — Telephone Encounter (Signed)
Left message for patient to call back to schedule consult per 2/28 referral.

## 2022-04-11 NOTE — Anesthesia Preprocedure Evaluation (Addendum)
Anesthesia Evaluation  Patient identified by MRN, date of birth, ID band Patient awake    Reviewed: Allergy & Precautions, NPO status , Patient's Chart, lab work & pertinent test results  Airway Mallampati: II  TM Distance: >3 FB Neck ROM: Full    Dental no notable dental hx.    Pulmonary    Pulmonary exam normal breath sounds clear to auscultation       Cardiovascular hypertension, Normal cardiovascular exam Rhythm:Regular Rate:Normal     Neuro/Psych    GI/Hepatic Rectal ca w mets   Endo/Other  diabetes    Renal/GU Lab Results      Component                Value               Date                      CREATININE               0.49 (L)            04/08/2022                 K                        4.3                 04/08/2022                    Musculoskeletal  (+) Arthritis ,    Abdominal   Peds  Hematology  (+) Blood dyscrasia, anemia Lab Results      Component                Value               Date                      WBC                      8.8                 04/08/2022                HGB                      9.3 (L)             04/08/2022                HCT                      29.5 (L)            04/08/2022                   PLT                      644 (H)             04/08/2022              Anesthesia Other Findings All: codeine lisionpril  Reproductive/Obstetrics                             Anesthesia Physical Anesthesia Plan  ASA:  2  Anesthesia Plan: General and Regional   Post-op Pain Management: Regional block* and Minimal or no pain anticipated   Induction: Intravenous  PONV Risk Score and Plan: 3 and Midazolam, Treatment may vary due to age or medical condition and Ondansetron  Airway Management Planned: Oral ETT  Additional Equipment: None  Intra-op Plan:   Post-operative Plan: Extubation in OR  Informed Consent:      Dental advisory  given  Plan Discussed with:   Anesthesia Plan Comments: (GA w R ISB)       Anesthesia Quick Evaluation

## 2022-04-11 NOTE — Telephone Encounter (Signed)
Contacted patient to scheduled appointments. Left message with appointment details and a call back number if patient had any questions or could not accommodate the time we provided.   

## 2022-04-11 NOTE — Telephone Encounter (Signed)
[facility] called & pt's insurance does not cover Oxycontin. Covers Xtampza, morphine, buprenorphine and belbuca.     I will send Xtampza then   IT

## 2022-04-11 NOTE — H&P (Signed)
PREOPERATIVE H&P  Chief Complaint: right humerus fracture  HPI: Joseph Hogan is a 62 y.o. male who is scheduled for, Procedure(s): INTRAMEDULLARY (IM) NAIL HUMERAL.   Patient has a past medical history significant for HTN, RCC.   This patient is an 62 year old male who presented to [facility] ED on 04/08/22 after developing acute onset of pain in his mid right arm after trying to turn a doorknob. 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 He then followed up with the cancer clinic and was found to have a rectal cancer on a colonoscopy. On 2/26, this was his first day of chemotherapy, he tried to turn the doorknob when he got home and felt a pop and has had significant pain since that time.   Symptoms are rated as moderate to severe, and have been worsening.  This is significantly impairing activities of daily living.    Please see clinic note for further details on this patient's care.    He has elected for surgical management.   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   with abnormal R wave progression noted on EKG   MRSA (methicillin resistant Staphylococcus aureus)    Pre-diabetes    Rectal carcinoma ([facility]) 03/19/2022   Past Surgical History:  Procedure Laterality Date   COLONOSCOPY     IR IMAGING GUIDED PORT INSERTION  04/05/2022   TOTAL HIP ARTHROPLASTY Left 02/06/2022   Procedure: TOTAL HIP ARTHROPLASTY;  Surgeon: [name], MD;  Location: [facility];  Service: Orthopedics;  Laterality: Left;   Social History   Socioeconomic History   Marital status: Divorced    Spouse name: Not on file   Number of children: Not on file   Years of education: Not on file   Highest education level: Not on file  Occupational History   Not on file  Tobacco Use   Smoking status: Never   Smokeless tobacco:  Never  Vaping Use   Vaping Use: Never used  Substance and Sexual Activity   Alcohol use: No   Drug use: No   Sexual activity: Not on file  Other Topics Concern   Not on file  Social History Narrative   Not on file   Social Determinants of Health   Financial Resource Strain: Not on file  Food Insecurity: No Food Insecurity (02/04/2022)   Hunger Vital Sign    Worried About Running Out of Food in the Last Year: Never true    Ran Out of Food in the Last Year: Never true  Transportation Needs: No Transportation Needs (02/04/2022)   PRAPARE - Hydrologist (Medical): No    Lack of Transportation (Non-Medical): No  Physical Activity: Not on file  Stress: Not on file  Social Connections: Not on file   Family History  Problem Relation Age of Onset   Stroke Mother    Hypertension Mother    Heart disease Father    Alzheimer's disease Father    Hypertension Brother    Alcohol abuse Brother    Cancer Maternal Aunt    Heart disease Maternal Aunt    Cancer Maternal Uncle    Heart disease Maternal Uncle    Cancer Paternal Aunt    Heart disease Paternal Aunt    Cancer Paternal Uncle  Heart disease Paternal Uncle    Heart disease Paternal Grandmother    Alzheimer's disease Paternal Grandfather    Stomach cancer Neg Hx    Rectal cancer Neg Hx    Esophageal cancer Neg Hx    Colon cancer Neg Hx    Allergies  Allergen Reactions   Codeine Nausea And Vomiting   Lisinopril Cough   Prior to Admission medications   Medication Sig Start Date End Date Taking? Authorizing Provider  b complex vitamins capsule Take 1 capsule by mouth daily.   Yes [provider]  gabapentin (NEURONTIN) 300 MG capsule Take 1 capsule (300 mg total) by mouth 3 (three) times daily. 02/11/22 04/10/22 Yes Barb Merino, MD  HYDROmorphone (DILAUDID) 2 MG tablet Take 2 mg by mouth every 4 (four) hours as needed for severe pain.   Yes [provider]  Multiple Vitamin  (MULTIVITAMIN WITH MINERALS) TABS tablet Take 1 tablet by mouth daily.   Yes [provider]  ondansetron (ZOFRAN) 8 MG tablet Take 11.5 tablets (92 mg total) by mouth every 8 (eight) hours as needed for nausea or vomiting. Patient taking differently: Take 8-16 mg by mouth every 8 (eight) hours as needed for nausea or vomiting. 03/16/22  Yes Lincoln Brigham, PA-C  prochlorperazine (COMPAZINE) 10 MG tablet Take 1 tablet (10 mg total) by mouth every 6 (six) hours as needed for nausea or vomiting. 03/16/22  Yes Dede Query T, PA-C  VITAMIN A PO Take 1 tablet by mouth daily.   Yes [provider]  lidocaine-prilocaine (EMLA) cream Apply 1 Application topically as needed. Patient not taking: Reported on 04/10/2022 04/08/22   Dede Query T, PA-C  oxyCODONE (OXYCONTIN) 10 mg 12 hr tablet Take 1 tablet (10 mg total) by mouth every 12 (twelve) hours. 04/10/22   Dede Query T, PA-C    ROS: All other systems have been reviewed and were otherwise negative with the exception of those mentioned in the HPI and as above.  Physical Exam: General: Alert, no acute distress Cardiovascular: No pedal edema Respiratory: No cyanosis, no use of accessory musculature GI: No organomegaly, abdomen is soft and non-tender Skin: No lesions in the area of chief complaint Neurologic: Sensation intact distally Psychiatric: Patient is competent for consent with normal mood and affect Lymphatic: No axillary or cervical lymphadenopathy  MUSCULOSKELETAL:  RUE: limited ROM due to pain. NVI  Imaging: Xrays of the right shoulder demonstrate destructive lesion within the proximal to mid right humeral shaft and associated acute to subacute pathologic fracture without significant displacement but with mild anterior apex angulation.  Assessment: right humerus fracture  Plan: Plan for Procedure(s): INTRAMEDULLARY (IM) NAIL HUMERAL  The risks benefits and alternatives were discussed with the patient including  but not limited to the risks of nonoperative treatment, versus surgical intervention including infection, bleeding, nerve injury,  blood clots, cardiopulmonary complications, morbidity, mortality, among others, and they were willing to proceed.   The patient acknowledged the explanation, agreed to proceed with the plan and consent was signed.   Operative Plan: IMN right pathologic humerus fracture Discharge Medications: standard DVT Prophylaxis: aspirin Physical Therapy: outpatient PT Special Discharge needs: Sling. IceMan   Ethelda Chick, PA-C  04/11/2022 7:13 AM

## 2022-04-12 ENCOUNTER — Encounter (HOSPITAL_COMMUNITY): Payer: Self-pay | Admitting: Orthopaedic Surgery

## 2022-04-12 ENCOUNTER — Ambulatory Visit (HOSPITAL_COMMUNITY): Payer: Medicare HMO | Admitting: Certified Registered Nurse Anesthetist

## 2022-04-12 ENCOUNTER — Encounter (HOSPITAL_COMMUNITY)
Admission: RE | Disposition: A | Discharge status: Home / Self Care | Payer: Self-pay | Source: Ambulatory Visit | Attending: Orthopaedic Surgery

## 2022-04-12 ENCOUNTER — Ambulatory Visit (HOSPITAL_COMMUNITY): Payer: Medicare HMO

## 2022-04-12 ENCOUNTER — Telehealth: Payer: Self-pay | Admitting: Hematology and Oncology

## 2022-04-12 ENCOUNTER — Ambulatory Visit (HOSPITAL_COMMUNITY)
Admission: RE | Admit: 2022-04-12 | Discharge: 2022-04-12 | Disposition: A | Discharge status: Home / Self Care | Payer: Medicare HMO | Source: Ambulatory Visit | Attending: Orthopaedic Surgery | Admitting: Orthopaedic Surgery

## 2022-04-12 DIAGNOSIS — M84521A Pathological fracture in neoplastic disease, right humerus, initial encounter for fracture: Secondary | ICD-10-CM | POA: Insufficient documentation

## 2022-04-12 DIAGNOSIS — Z9889 Other specified postprocedural states: Secondary | ICD-10-CM | POA: Diagnosis not present

## 2022-04-12 DIAGNOSIS — M84421A Pathological fracture, right humerus, initial encounter for fracture: Secondary | ICD-10-CM | POA: Diagnosis not present

## 2022-04-12 DIAGNOSIS — I10 Essential (primary) hypertension: Secondary | ICD-10-CM | POA: Diagnosis not present

## 2022-04-12 DIAGNOSIS — C7951 Secondary malignant neoplasm of bone: Secondary | ICD-10-CM | POA: Insufficient documentation

## 2022-04-12 DIAGNOSIS — E119 Type 2 diabetes mellitus without complications: Secondary | ICD-10-CM | POA: Diagnosis not present

## 2022-04-12 DIAGNOSIS — C20 Malignant neoplasm of rectum: Secondary | ICD-10-CM | POA: Diagnosis not present

## 2022-04-12 DIAGNOSIS — Z79899 Other long term (current) drug therapy: Secondary | ICD-10-CM | POA: Insufficient documentation

## 2022-04-12 DIAGNOSIS — M1990 Unspecified osteoarthritis, unspecified site: Secondary | ICD-10-CM | POA: Diagnosis not present

## 2022-04-12 DIAGNOSIS — D649 Anemia, unspecified: Secondary | ICD-10-CM

## 2022-04-12 DIAGNOSIS — R7303 Prediabetes: Secondary | ICD-10-CM

## 2022-04-12 DIAGNOSIS — S42301A Unspecified fracture of shaft of humerus, right arm, initial encounter for closed fracture: Secondary | ICD-10-CM | POA: Diagnosis not present

## 2022-04-12 DIAGNOSIS — G8918 Other acute postprocedural pain: Secondary | ICD-10-CM | POA: Diagnosis not present

## 2022-04-12 HISTORY — PX: HUMERUS IM NAIL: SHX1769

## 2022-04-12 HISTORY — DX: Iron deficiency anemia, unspecified: D50.9

## 2022-04-12 LAB — TYPE AND SCREEN
ABO/RH(D): O POS
Antibody Screen: NEGATIVE

## 2022-04-12 LAB — BASIC METABOLIC PANEL
Anion gap: 11 (ref 5–15)
BUN: 17 mg/dL (ref 8–23)
CO2: 24 mmol/L (ref 22–32)
Calcium: 8.4 mg/dL — ABNORMAL LOW (ref 8.9–10.3)
Chloride: 100 mmol/L (ref 98–111)
Creatinine, Ser: 0.59 mg/dL — ABNORMAL LOW (ref 0.61–1.24)
GFR, Estimated: >60 mL/min (ref >60)
Glucose, Bld: 168 mg/dL — ABNORMAL HIGH (ref 70–99)
Potassium: 4.2 mmol/L (ref 3.5–5.1)
Sodium: 135 mmol/L (ref 135–145)

## 2022-04-12 LAB — CBC
HCT: 30.5 % — ABNORMAL LOW (ref 39.0–52.0)
Hemoglobin: 8.9 g/dL — ABNORMAL LOW (ref 13.0–17.0)
MCH: 23.6 pg — ABNORMAL LOW (ref 26.0–34.0)
MCHC: 29.2 g/dL — ABNORMAL LOW (ref 30.0–36.0)
MCV: 80.9 fL (ref 80.0–100.0)
Platelets: 576 10*3/uL — ABNORMAL HIGH (ref 150–400)
RBC: 3.77 MIL/uL — ABNORMAL LOW (ref 4.22–5.81)
RDW: 14.9 % (ref 11.5–15.5)
WBC: 7.4 10*3/uL (ref 4.0–10.5)
nRBC: 0 % (ref 0.0–0.2)

## 2022-04-12 SURGERY — INSERTION, INTRAMEDULLARY ROD, HUMERUS
Anesthesia: Regional | Laterality: Right

## 2022-04-12 MED ORDER — CHLORHEXIDINE GLUCONATE 0.12 % MT SOLN
15.0000 mL | Freq: Once | OROMUCOSAL | Status: AC
  Administered 2022-04-12: 15 mL via OROMUCOSAL

## 2022-04-12 MED ORDER — ONDANSETRON HCL 4 MG/2ML IJ SOLN
INTRAMUSCULAR | Status: AC
  Filled 2022-04-12: qty 2

## 2022-04-12 MED ORDER — VANCOMYCIN HCL 1 G IV SOLR
INTRAVENOUS | Status: DC | PRN
  Administered 2022-04-12: 1000 mg via TOPICAL

## 2022-04-12 MED ORDER — OXYCODONE HCL 5 MG PO TABS
5.0000 mg | ORAL_TABLET | Freq: Once | ORAL | Status: AC | PRN
  Administered 2022-04-12: 5 mg via ORAL

## 2022-04-12 MED ORDER — ONDANSETRON HCL 4 MG/2ML IJ SOLN
INTRAMUSCULAR | Status: DC | PRN
  Administered 2022-04-12: 4 mg via INTRAVENOUS

## 2022-04-12 MED ORDER — KETAMINE HCL 50 MG/5ML IJ SOSY
PREFILLED_SYRINGE | INTRAMUSCULAR | Status: AC
  Filled 2022-04-12: qty 5

## 2022-04-12 MED ORDER — PROPOFOL 10 MG/ML IV BOLUS
INTRAVENOUS | Status: AC
  Filled 2022-04-12: qty 20

## 2022-04-12 MED ORDER — OXYCODONE HCL 5 MG/5ML PO SOLN: 5.0000 mg | Freq: Once | ORAL | Status: AC | PRN

## 2022-04-12 MED ORDER — EPHEDRINE SULFATE-NACL 50-0.9 MG/10ML-% IV SOSY
PREFILLED_SYRINGE | INTRAVENOUS | Status: DC | PRN
  Administered 2022-04-12: 10 mg via INTRAVENOUS

## 2022-04-12 MED ORDER — FENTANYL CITRATE PF 50 MCG/ML IJ SOSY
50.0000 ug | PREFILLED_SYRINGE | INTRAMUSCULAR | Status: DC
  Administered 2022-04-12: 100 ug via INTRAVENOUS
  Filled 2022-04-12: qty 2

## 2022-04-12 MED ORDER — SUGAMMADEX SODIUM 200 MG/2ML IV SOLN
INTRAVENOUS | Status: DC | PRN
  Administered 2022-04-12: 200 mg via INTRAVENOUS

## 2022-04-12 MED ORDER — LACTATED RINGERS IV SOLN: INTRAVENOUS | Status: DC

## 2022-04-12 MED ORDER — BUPIVACAINE HCL (PF) 0.5 % IJ SOLN
INTRAMUSCULAR | Status: DC | PRN
  Administered 2022-04-12: 15 mL via PERINEURAL

## 2022-04-12 MED ORDER — DEXAMETHASONE SODIUM PHOSPHATE 10 MG/ML IJ SOLN
INTRAMUSCULAR | Status: AC
  Filled 2022-04-12: qty 1

## 2022-04-12 MED ORDER — LIDOCAINE 2% (20 MG/ML) 5 ML SYRINGE
INTRAMUSCULAR | Status: DC | PRN
  Administered 2022-04-12: 20 mg via INTRAVENOUS

## 2022-04-12 MED ORDER — AMISULPRIDE (ANTIEMETIC) 5 MG/2ML IV SOLN: 10.0000 mg | Freq: Once | INTRAVENOUS | Status: DC | PRN

## 2022-04-12 MED ORDER — KETAMINE HCL 10 MG/ML IJ SOLN
INTRAMUSCULAR | Status: DC | PRN
  Administered 2022-04-12: 20 mg via INTRAVENOUS
  Administered 2022-04-12: 10 mg via INTRAVENOUS

## 2022-04-12 MED ORDER — BUPIVACAINE LIPOSOME 1.3 % IJ SUSP
INTRAMUSCULAR | Status: DC | PRN
  Administered 2022-04-12: 10 mL via PERINEURAL

## 2022-04-12 MED ORDER — ASPIRIN 81 MG PO CHEW: 81.0000 mg | CHEWABLE_TABLET | Freq: Two times a day (BID) | ORAL | 0 refills | Status: AC

## 2022-04-12 MED ORDER — FENTANYL CITRATE (PF) 100 MCG/2ML IJ SOLN
INTRAMUSCULAR | Status: AC
  Filled 2022-04-12: qty 2

## 2022-04-12 MED ORDER — FENTANYL CITRATE (PF) 100 MCG/2ML IJ SOLN
INTRAMUSCULAR | Status: DC | PRN
  Administered 2022-04-12 (×2): 50 ug via INTRAVENOUS

## 2022-04-12 MED ORDER — HYDROMORPHONE HCL 1 MG/ML IJ SOLN
0.2500 mg | INTRAMUSCULAR | Status: DC | PRN
  Administered 2022-04-12: 0.5 mg via INTRAVENOUS
  Administered 2022-04-12: 0.25 mg via INTRAVENOUS
  Administered 2022-04-12: 0.5 mg via INTRAVENOUS
  Administered 2022-04-12 (×3): 0.25 mg via INTRAVENOUS

## 2022-04-12 MED ORDER — ACETAMINOPHEN 500 MG PO TABS
1000.0000 mg | ORAL_TABLET | Freq: Once | ORAL | Status: AC
  Administered 2022-04-12: 1000 mg via ORAL
  Filled 2022-04-12: qty 2

## 2022-04-12 MED ORDER — ACETAMINOPHEN 500 MG PO TABS: 1000.0000 mg | ORAL_TABLET | Freq: Three times a day (TID) | ORAL | 0 refills | Status: AC

## 2022-04-12 MED ORDER — PROPOFOL 10 MG/ML IV BOLUS
INTRAVENOUS | Status: DC | PRN
  Administered 2022-04-12: 160 mg via INTRAVENOUS

## 2022-04-12 MED ORDER — HYDROMORPHONE HCL 1 MG/ML IJ SOLN
INTRAMUSCULAR | Status: AC
  Filled 2022-04-12: qty 1

## 2022-04-12 MED ORDER — VANCOMYCIN HCL 1000 MG IV SOLR
INTRAVENOUS | Status: AC
  Filled 2022-04-12: qty 20

## 2022-04-12 MED ORDER — PHENYLEPHRINE HCL-NACL 20-0.9 MG/250ML-% IV SOLN
INTRAVENOUS | Status: DC | PRN
  Administered 2022-04-12: 40 ug/min via INTRAVENOUS

## 2022-04-12 MED ORDER — ACETAMINOPHEN 10 MG/ML IV SOLN: 1000.0000 mg | Freq: Once | INTRAVENOUS | Status: DC | PRN

## 2022-04-12 MED ORDER — DEXAMETHASONE SODIUM PHOSPHATE 10 MG/ML IJ SOLN
INTRAMUSCULAR | Status: DC | PRN
  Administered 2022-04-12: 8 mg via INTRAVENOUS

## 2022-04-12 MED ORDER — MIDAZOLAM HCL 2 MG/2ML IJ SOLN
1.0000 mg | INTRAMUSCULAR | Status: DC
  Administered 2022-04-12: 2 mg via INTRAVENOUS
  Filled 2022-04-12: qty 2

## 2022-04-12 MED ORDER — ROCURONIUM BROMIDE 10 MG/ML (PF) SYRINGE
PREFILLED_SYRINGE | INTRAVENOUS | Status: DC | PRN
  Administered 2022-04-12: 50 mg via INTRAVENOUS

## 2022-04-12 MED ORDER — METHOCARBAMOL 500 MG PO TABS: 500.0000 mg | ORAL_TABLET | Freq: Three times a day (TID) | ORAL | 0 refills | Status: DC | PRN

## 2022-04-12 MED ORDER — GABAPENTIN 300 MG PO CAPS
300.0000 mg | ORAL_CAPSULE | Freq: Once | ORAL | Status: AC
  Administered 2022-04-12: 300 mg via ORAL
  Filled 2022-04-12: qty 1

## 2022-04-12 MED ORDER — ONDANSETRON HCL 4 MG/2ML IJ SOLN: 4.0000 mg | Freq: Once | INTRAMUSCULAR | Status: DC | PRN

## 2022-04-12 MED ORDER — OXYCODONE HCL 5 MG PO TABS
ORAL_TABLET | ORAL | Status: AC
  Filled 2022-04-12: qty 1

## 2022-04-12 MED ORDER — ORAL CARE MOUTH RINSE: 15.0000 mL | Freq: Once | OROMUCOSAL | Status: AC

## 2022-04-12 MED ORDER — ACETAMINOPHEN 10 MG/ML IV SOLN
INTRAVENOUS | Status: AC
  Filled 2022-04-12: qty 100

## 2022-04-12 MED ORDER — CEFAZOLIN SODIUM-DEXTROSE 2-4 GM/100ML-% IV SOLN
2.0000 g | INTRAVENOUS | Status: AC
  Administered 2022-04-12: 2 g via INTRAVENOUS
  Filled 2022-04-12: qty 100

## 2022-04-12 SURGICAL SUPPLY — 61 items
AID PSTN UNV HD RSTRNT DISP (MISCELLANEOUS) ×1
APL PRP STRL LF DISP 70% ISPRP (MISCELLANEOUS) ×2
BAG COUNTER SPONGE SURGICOUNT (BAG) IMPLANT
BAG SPEC THK2 15X12 ZIP CLS (MISCELLANEOUS) ×1
BAG SPNG CNTER NS LX DISP (BAG)
BAG ZIPLOCK 12X15 (MISCELLANEOUS) ×1 IMPLANT
BIT DRILL 3.8X270 (BIT) IMPLANT
BIT DRILL CAL 3.2 LONG (BIT) IMPLANT
BIT DRILL SHORT 3.2MM (DRILL) IMPLANT
CHLORAPREP W/TINT 26 (MISCELLANEOUS) ×2 IMPLANT
CLSR STERI-STRIP ANTIMIC 1/2X4 (GAUZE/BANDAGES/DRESSINGS) ×2 IMPLANT
COVER SURGICAL LIGHT HANDLE (MISCELLANEOUS) ×1 IMPLANT
DRAPE C-ARM 42X120 X-RAY (DRAPES) ×1 IMPLANT
DRAPE ORTHO SPLIT 77X108 STRL (DRAPES) ×2
DRAPE SHEET LG 3/4 BI-LAMINATE (DRAPES) ×2 IMPLANT
DRAPE SURG ORHT 6 SPLT 77X108 (DRAPES) ×2 IMPLANT
DRILL SHORT 3.2MM (DRILL) ×1
DRSG ADAPTIC 3X8 NADH LF (GAUZE/BANDAGES/DRESSINGS) IMPLANT
DRSG AQUACEL AG 3.5X4 (GAUZE/BANDAGES/DRESSINGS) IMPLANT
DRSG AQUACEL AG ADV 3.5X 6 (GAUZE/BANDAGES/DRESSINGS) IMPLANT
DRSG AQUACEL AG ADV 3.5X10 (GAUZE/BANDAGES/DRESSINGS) IMPLANT
ELECT REM PT RETURN 15FT ADLT (MISCELLANEOUS) ×1 IMPLANT
GAUZE PAD ABD 8X10 STRL (GAUZE/BANDAGES/DRESSINGS) IMPLANT
GAUZE SPONGE 4X4 12PLY STRL (GAUZE/BANDAGES/DRESSINGS) IMPLANT
GAUZE XEROFORM 1X8 LF (GAUZE/BANDAGES/DRESSINGS) IMPLANT
GLOVE BIO SURGEON STRL SZ 6.5 (GLOVE) ×1 IMPLANT
GLOVE BIOGEL PI IND STRL 6.5 (GLOVE) ×1 IMPLANT
GLOVE BIOGEL PI IND STRL 8 (GLOVE) ×1 IMPLANT
GLOVE ECLIPSE 8.0 STRL XLNG CF (GLOVE) ×2 IMPLANT
GOWN STRL REUS W/ TWL LRG LVL3 (GOWN DISPOSABLE) ×2 IMPLANT
GOWN STRL REUS W/TWL LRG LVL3 (GOWN DISPOSABLE) ×2
K-WIRE TROCAR POINT 2.5X280 (WIRE) ×1
KIT BASIN OR (CUSTOM PROCEDURE TRAY) ×1 IMPLANT
KIT STABILIZATION SHOULDER (MISCELLANEOUS) ×1 IMPLANT
KIT TURNOVER KIT A (KITS) IMPLANT
KWIRE TROCAR POINT 2.5X280 (WIRE) IMPLANT
NAIL CANN HUM MLOCK 7X270 RT (Nail) IMPLANT
NDL SPNL 18GX3.5 QUINCKE PK (NEEDLE) ×1 IMPLANT
NEEDLE SPNL 18GX3.5 QUINCKE PK (NEEDLE) ×1 IMPLANT
NS IRRIG 1000ML POUR BTL (IV SOLUTION) ×1 IMPLANT
PACK SHOULDER (CUSTOM PROCEDURE TRAY) ×1 IMPLANT
PROTECTOR NERVE ULNAR (MISCELLANEOUS) ×1 IMPLANT
RESTRAINT HEAD UNIVERSAL NS (MISCELLANEOUS) ×1 IMPLANT
ROD REAMING 2.5 (INSTRUMENTS) IMPLANT
SCREW LOCK 4.5X38 TI FT (Screw) IMPLANT
SCREW LOCK 4.5X44 (Screw) IMPLANT
SCREW LOCK 4X30 TI (Screw) IMPLANT
SCREW LOCK T25 FT 32X4X3.3X (Screw) IMPLANT
SCREW LOCK TI 4X62 (Screw) IMPLANT
SCREW LOCKING 4X32 (Screw) ×1 IMPLANT
SUT ETHIBOND NAB CT1 #1 30IN (SUTURE) IMPLANT
SUT ETHILON 2 0 PS N (SUTURE) IMPLANT
SUT FIBERWIRE #2 38 T-5 BLUE (SUTURE)
SUT MNCRL AB 4-0 PS2 18 (SUTURE) IMPLANT
SUT VIC AB 0 CT1 36 (SUTURE) ×1 IMPLANT
SUT VIC AB 1 CT1 36 (SUTURE) IMPLANT
SUT VIC AB 2-0 CT1 27 (SUTURE) ×1
SUT VIC AB 2-0 CT1 TAPERPNT 27 (SUTURE) ×1 IMPLANT
SUTURE FIBERWR #2 38 T-5 BLUE (SUTURE) IMPLANT
TOWEL OR 17X26 10 PK STRL BLUE (TOWEL DISPOSABLE) ×1 IMPLANT
WATER STERILE IRR 1000ML POUR (IV SOLUTION) ×1 IMPLANT

## 2022-04-12 NOTE — Anesthesia Procedure Notes (Signed)
Anesthesia Regional Block: Interscalene brachial plexus block   Pre-Anesthetic Checklist: , timeout performed,  Correct Patient, Correct Site, Correct Laterality,  Correct Procedure, Correct Position, site marked,  Risks and benefits discussed,  Surgical consent,  Pre-op evaluation,  At surgeon's request and post-op pain management  Laterality: Upper and Right  Prep: Maximum Sterile Barrier Precautions used, chloraprep       Needles:  Injection technique: Single-shot  Needle Type: Echogenic Needle     Needle Length: 5cm  Needle Gauge: 21     Additional Needles:   Procedures:,,,, ultrasound used (permanent image in chart),,    Narrative:  Start time: 04/12/2022 11:09 AM End time: 04/12/2022 11:18 AM Injection made incrementally with aspirations every 5 mL.  Performed by: Personally  Anesthesiologist: Barnet Glasgow, MD  Additional Notes: Block assessed prior to procedure. Patient tolerated procedure well.

## 2022-04-12 NOTE — Interval H&P Note (Signed)
All questions answered

## 2022-04-12 NOTE — Telephone Encounter (Signed)
Called patient's son after speaking with him in scheduling about moving IV iron appointments to [facility] location. Confirmed with MD OK to move. Sent scheduling high priority message to [facility] to contact patient about moving appointments.

## 2022-04-12 NOTE — Anesthesia Postprocedure Evaluation (Signed)
Anesthesia Post Note  Patient: Joseph Hogan  Procedure(s) Performed: INTRAMEDULLARY (IM) NAIL HUMERAL (Right)     Patient location during evaluation: PACU Anesthesia Type: Regional Level of consciousness: awake and alert Pain management: pain level controlled Vital Signs Assessment: post-procedure vital signs reviewed and stable Respiratory status: spontaneous breathing, nonlabored ventilation, respiratory function stable and patient connected to nasal cannula oxygen Cardiovascular status: blood pressure returned to baseline and stable Postop Assessment: no apparent nausea or vomiting Anesthetic complications: no  No notable events documented.  Last Vitals:  Vitals:   04/12/22 1515 04/12/22 1530  BP: (!) 158/85 (!) 148/84  Pulse: 73 74  Resp: (!) 24 (!) 21  Temp:    SpO2: 98% 96%    Last Pain:  Vitals:   04/12/22 1530  TempSrc:   PainSc: 6                  Barnet Glasgow

## 2022-04-12 NOTE — Op Note (Signed)
Orthopaedic Surgery Operative Note (CSN: [id number])  Joseph Hogan  [birth year] Date of Surgery: 04/12/2022   Diagnoses:  Right pathologic humerus fracture  Procedure: Right humeral nail for open treatment of humeral shaft fracture   Operative Finding Successful completion of the planned procedure.  Patient had clear mobility at the fracture site and a lytic lesion.  We were able to ream and placed a Synthes 7 x 270 mm nail.  Proximal bone was marginal at best and we had 3 interlock screws proximally and 2 distally.  We made a small incision about 3 cm in length and dissected directly to bone on the distal aspect to avoid neurovascular compromise.  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  Post-operative plan: The patient will be weightbearing to tolerance with sling for comfort only.  The patient will be discharged home.  DVT prophylaxis per primary team, no orthopedic contraindications.   Pain control with PRN pain medication preferring oral medicines.  Follow up plan will be scheduled in approximately 7 days for incision check and XR.  Radiation per primary team.  Patient wanted to discharge home and his mobility was not significant change and we felt that this was appropriate based on his minimal blood loss.  Post-Op Diagnosis: Same Surgeons:Primary: [name], MD Assistants:Caroline McBane PA-C Location: [place] 06 Anesthesia: General with regional anesthesia Antibiotics: Ancef 2 g with local vancomycin powder 1 g at the surgical site Tourniquet time:  Estimated Blood Loss: 50 Complications: None Specimens: None Implants: Implant Name Type Inv. Item Serial No. Manufacturer Lot No. LRB No. Used Action  7MM TI MULTILOC HUMERAL NAIL RIGHT     [id number] Right 1 Implanted  SCREW LOCK 4.5X38 TI FT - [id number] Screw SCREW LOCK  4.5X38 TI FT  [employer]  Right 1 Implanted  SCREW LOCK 4.5X44 - [id number] Screw SCREW LOCK 4.5X44  [employer]  Right 1 Implanted  4.0MM TITANIUM LOCKING SCREWS 62MM      Right 1 Implanted  SCREW LOCK 4X30 TI - [id number] Screw SCREW LOCK 4X30 TI  [employer]  Right 1 Implanted  SCREW LOCKING 4X32 - [id number] Screw SCREW LOCKING 4X32  [employer]TH[employer]  Right 1 Implanted    Indications for Surgery:   Joseph Hogan is a 62 y.o. male with metastatic cancer and a lytic right humerus lesion with fracture.  Benefits and risks of operative and nonoperative management were discussed prior to surgery with patient/guardian(s) and informed consent form was completed.  Specific risks including infection, need for additional surgery, nonunion, malunion, loss of fixation, need for further surgery amongst others.   Procedure:   The patient was identified properly. Informed consent was obtained and the surgical site was marked. The patient was taken up to suite where general anesthesia was induced.  The patient was positioned beachchair on [employer] table.  The right shoulder was prepped and draped in the usual sterile fashion.  Timeout was performed before the beginning of the case.  We began with a 18-gauge spinal needle and made a percutaneous type stab just anterior to the [facility] joint.  We placed the arm in relative extension to expose more of the humeral head.  We used orthogonal fluoroscopic images to guide our immediate placement.  We switched the needle for a entry guidewire.  We then made a stab incision around this  taking care to note that we were far medial into the head to avoid damage to the rotator cuff tendon.  We bluntly dissected down with a tonsil directly to the articular surface.  We then used a Arthrex Bio-Tenodesis 10 mm entry reamer on oscillate to carefully inserted through the soft tissues and make an opening canal.  We then had good access to the proximal humerus with  appropriate alignment.  We used orthogonal images to reduce the fracture indirectly until it was adequately reduced.  Fracture was complete however did have some mobility consistent with pathologic fracture.  That point were able to place a guidewire down the fracture and were able to align the fracture fragments.  We then selected a 270x 7 mm mm nail and were able to place it without issue obtaining orthogonal images we progressed.  We used the outrigger to place 3 proximal screws in typical fashion using percutaneously placed incisions and spreading through the deep surfaces down to bone.  We had good fixation of these 3 screws.  We used perfect circle technique and a 3 cm incision distally which dissected bluntly down to bone and placed interlocks by perfect circle technique.  We placed 2 distal interlock in a similar fashion.  [place] films demonstrated no perforation of the joint surface and good fixation of the proximal distal aspects of the nail.   We again irrigated before placing local vancomycin powder and closing the incision in a multilayer fashion.  Sterile dressing was placed and the patient was placed in a sling.  Patient awoken taken to PACU in stable condition.  Noemi Chapel, PA-C, present and scrubbed throughout the case, critical for completion in a timely fashion, and for retraction, instrumentation, closure.

## 2022-04-12 NOTE — Addendum Note (Signed)
Addendum  created 04/12/22 1603 by Barnet Glasgow, MD   Clinical Note Signed

## 2022-04-12 NOTE — Discharge Instructions (Addendum)
Ophelia Charter MD, MPH Noemi Chapel, PA-C [place] [address], [address] [phone] (tel)   [phone] (fax)   [place] may leave the operative dressing in place until your follow-up appointment. KEEP THE INCISIONS CLEAN AND DRY. There may be a small amount of fluid/bleeding leaking at the surgical site. This is normal after surgery.  If it fills with liquid or blood please call us immediately to change it for you. Use the provided ice machine or Ice packs as often as possible for the first 3-4 days, then as needed for pain relief.   Keep a layer of cloth or a shirt between your skin and the cooling unit to prevent frost bite as it can get very cold.  SHOWERING: - You may shower on Post-Op Day #2.  - The dressing is water resistant but do not scrub it as it may start to peel up.   - You may remove the sling for showering - Gently pat the area dry.  - Do not soak the shoulder in water.  - Do not go swimming in the pool or ocean until your incision has completely healed (about 4-6 weeks after surgery) - KEEP THE INCISIONS CLEAN AND DRY.  EXERCISES Wear your sling until your nerve block wears off After that you can use your sling for comfort You are allowed to move your right shoulder, elbow, wrist, hand as much as you feel comfortable Do not lift anything heavy with your operative arm   It is normal for your fingers/hand to become more swollen after surgery and discolored from bruising.   This will resolve over the first few weeks usually after surgery. Please continue to ambulate and do not stay sitting or lying for too long.  Perform foot and wrist pumps to assist in circulation.  REGIONAL ANESTHESIA (NERVE BLOCKS) The anesthesia team may have performed a nerve block for you this is a great tool used to minimize pain.   The block may start wearing off overnight (between 8-24 hours postop) When the  block wears off, your pain may go from nearly zero to the pain you would have had postop without the block. This is an abrupt transition but nothing dangerous is happening.   This can be a challenging period but utilize your as needed pain medications to try and manage this period. We suggest you use the pain medication the first night prior to going to bed, to ease this transition.  You may take an extra dose of narcotic when this happens if needed   POST-OP MEDICATIONS- Multimodal approach to pain control In general your pain will be controlled with a combination of substances.  Prescriptions unless otherwise discussed are electronically sent to your pharmacy.  This is a carefully made plan we use to minimize narcotic use.      Acetaminophen - Non-narcotic pain medicine taken on a scheduled basis  Robaxin - this is a muscle relaxer, take as needed for muscle spasms Aspirin '81mg'$  - This medicine is used to minimize the risk of blood clots after surgery.   You may continue using Dilaudid as needed for severe pain only - you were prescribed this on 2/27 by Dr. Zachery Dakins    FOLLOW-UP If you develop a Fever (>101.5), Redness or Drainage from the surgical incision site, please call our office to arrange for an evaluation. Please call the office to schedule a follow-up appointment for a wound check, 7-10  days post-operatively.  IF YOU HAVE ANY QUESTIONS, PLEASE FEEL FREE TO CALL OUR OFFICE.  HELPFUL INFORMATION  You may be more comfortable sleeping in a semi-seated position the first few nights following surgery.  Keep a pillow propped under the elbow and forearm for comfort.  If you have a recliner type of chair it might be beneficial.  If not that is fine too, but it would be helpful to sleep propped up with pillows behind your operated shoulder as well under your elbow and forearm.  This will reduce pulling on the suture lines.  When dressing, put your operative arm in the sleeve first.   When getting undressed, take your operative arm out last.  Loose fitting, button-down shirts are recommended.  In most states it is against the law to drive while your arm is in a sling. And certainly against the law to drive while taking narcotics.  You may return to work/school in the next couple of days when you feel up to it. Desk work and typing in the sling is fine.  We suggest you use the pain medication the first night prior to going to bed, in order to ease any pain when the anesthesia wears off. You should avoid taking pain medications on an empty stomach as it will make you nauseous.  You should wean off your narcotic medicines as soon as you are able.     Most patients will be off or using minimal narcotics before their first postop appointment.   Do not drink alcoholic beverages or take illicit drugs when taking pain medications.  Pain medication may make you constipated.  Below are a few solutions to try in this order: Decrease the amount of pain medication if you aren't having pain. Drink lots of decaffeinated fluids. Drink prune juice and/or each dried prunes  If the first 3 don't work start with additional solutions Take Colace - an over-the-counter stool softener Take Senokot - an over-the-counter laxative Take Miralax - a stronger over-the-counter laxative   Dental Antibiotics:  In most cases prophylactic antibiotics for Dental procdeures after total joint surgery are not necessary.  Exceptions are as follows:  1. History of prior total joint infection  2. Severely immunocompromised (Organ Transplant, cancer chemotherapy, Rheumatoid biologic meds such as [place])  3. Poorly controlled diabetes (A1C &gt; 8.0, blood glucose over 200)  If you have one of these conditions, contact your surgeon for an antibiotic prescription, prior to your dental procedure.   For more information including helpful videos and documents visit our website:    [email or web address]

## 2022-04-12 NOTE — Anesthesia Postprocedure Evaluation (Signed)
Anesthesia Post Note  Patient: Joseph Hogan  Procedure(s) Performed: INTRAMEDULLARY (IM) NAIL HUMERAL (Right)     Patient location during evaluation: PACU Anesthesia Type: Regional and General Level of consciousness: awake and alert Pain management: pain level controlled Vital Signs Assessment: post-procedure vital signs reviewed and stable Respiratory status: spontaneous breathing, nonlabored ventilation, respiratory function stable and patient connected to nasal cannula oxygen Cardiovascular status: blood pressure returned to baseline and stable Postop Assessment: no apparent nausea or vomiting Anesthetic complications: no  No notable events documented.  Last Vitals:  Vitals:   04/12/22 1515 04/12/22 1530  BP: (!) 158/85 (!) 148/84  Pulse: 73 74  Resp: (!) 24 (!) 21  Temp:    SpO2: 98% 96%    Last Pain:  Vitals:   04/12/22 1530  TempSrc:   PainSc: 6                  Barnet Glasgow

## 2022-04-12 NOTE — Anesthesia Procedure Notes (Signed)
Procedure Name: Intubation Date/Time: 04/12/2022 12:54 PM  Performed by: West Pugh, CRNAPre-anesthesia Checklist: Patient identified, Emergency Drugs available, Suction available, Patient being monitored and Timeout performed Patient Re-evaluated:Patient Re-evaluated prior to induction Oxygen Delivery Method: Circle system utilized Preoxygenation: Pre-oxygenation with 100% oxygen Induction Type: IV induction Ventilation: Mask ventilation without difficulty Laryngoscope Size: Mac and 4 Grade View: Grade II Tube type: Oral Tube size: 7.5 mm Number of attempts: 1 Airway Equipment and Method: Stylet Placement Confirmation: ETT inserted through vocal cords under direct vision, positive ETCO2, CO2 detector and breath sounds checked- equal and bilateral Secured at: 22 cm Tube secured with: Tape Dental Injury: Teeth and Oropharynx as per pre-operative assessment

## 2022-04-12 NOTE — Transfer of Care (Signed)
Immediate Anesthesia Transfer of Care Note  Patient: AVITAJ SCHOLLER  Procedure(s) Performed: INTRAMEDULLARY (IM) NAIL HUMERAL (Right)  Patient Location: PACU  Anesthesia Type:GA combined with regional for post-op pain  Level of Consciousness: drowsy and patient cooperative  Airway & Oxygen Therapy: Patient Spontanous Breathing and Patient connected to face mask oxygen  Post-op Assessment: Report given to RN and Post -op Vital signs reviewed and stable  Post vital signs: Reviewed and stable  Last Vitals:  Vitals Value Taken Time  BP 136/66 04/12/22 1430  Temp    Pulse 72 04/12/22 1431  Resp 27 04/12/22 1431  SpO2 97 % 04/12/22 1431  Vitals shown include unvalidated device data.  Last Pain:  Vitals:   04/12/22 1430  TempSrc:   PainSc: 0-No pain         Complications: No notable events documented.

## 2022-04-13 ENCOUNTER — Inpatient Hospital Stay: Payer: Medicare HMO

## 2022-04-13 LAB — HEMOGLOBIN A1C
Hgb A1c MFr Bld: 7.4 % — ABNORMAL HIGH (ref 4.8–5.6)
Mean Plasma Glucose: 166 mg/dL

## 2022-04-15 ENCOUNTER — Inpatient Hospital Stay: Payer: Medicare HMO

## 2022-04-15 ENCOUNTER — Other Ambulatory Visit: Payer: Medicare HMO

## 2022-04-15 ENCOUNTER — Encounter (HOSPITAL_COMMUNITY): Payer: Self-pay | Admitting: Orthopaedic Surgery

## 2022-04-15 ENCOUNTER — Ambulatory Visit: Payer: Medicare HMO

## 2022-04-15 ENCOUNTER — Ambulatory Visit: Payer: Medicare HMO | Admitting: Physician Assistant

## 2022-04-16 ENCOUNTER — Telehealth: Payer: Self-pay

## 2022-04-16 ENCOUNTER — Telehealth: Payer: Self-pay | Admitting: Hematology and Oncology

## 2022-04-16 ENCOUNTER — Telehealth: Payer: Self-pay | Admitting: Radiation Oncology

## 2022-04-16 NOTE — Telephone Encounter (Signed)
        Patient  visited [facility] on 2/26   Telephone encounter attempt :  1st  A HIPAA compliant voice message was left requesting a return call.  Instructed patient to call back .    [facility] [phone] [address], [place], [place] [address] Phone: [phone] Email: [email or web address]

## 2022-04-16 NOTE — Telephone Encounter (Signed)
Unable to leave message for patient to call back to schedule consult per 2/28 referral due to full voicemail. Will try again later.

## 2022-04-16 NOTE — Telephone Encounter (Signed)
Sent communication to [facility] to have patient's irons moved to that facility. Received [facility] message back that they have not been able to contact patient or relatives. Called patient/patient's son/patient's daughter about calling [facility] back to get IV irons scheduled. Was hung up on. Informed MD of issues. Will continue to f/u.

## 2022-04-17 ENCOUNTER — Other Ambulatory Visit: Payer: Self-pay

## 2022-04-17 ENCOUNTER — Telehealth: Payer: Self-pay

## 2022-04-17 DIAGNOSIS — D500 Iron deficiency anemia secondary to blood loss (chronic): Secondary | ICD-10-CM

## 2022-04-17 NOTE — Telephone Encounter (Signed)
        Patient  visited [facility] on 2/26     Telephone encounter attempt :  2nd  A HIPAA compliant voice message was left requesting a return call.  Instructed patient to call back .   [facility] [phone] [address], [place], [place] [address] Phone: [phone] Email: [email or web address]

## 2022-04-18 ENCOUNTER — Encounter (HOSPITAL_COMMUNITY)
Admission: RE | Admit: 2022-04-18 | Discharge: 2022-04-18 | Disposition: A | Discharge status: Home / Self Care | Payer: Medicare HMO | Source: Ambulatory Visit | Attending: Hematology and Oncology | Admitting: Hematology and Oncology

## 2022-04-18 VITALS — BP 133/86 | HR 107 | Temp 98.2°F | Resp 20

## 2022-04-18 DIAGNOSIS — Z95828 Presence of other vascular implants and grafts: Secondary | ICD-10-CM | POA: Insufficient documentation

## 2022-04-18 DIAGNOSIS — Z5111 Encounter for antineoplastic chemotherapy: Secondary | ICD-10-CM | POA: Insufficient documentation

## 2022-04-18 DIAGNOSIS — C7951 Secondary malignant neoplasm of bone: Secondary | ICD-10-CM | POA: Diagnosis not present

## 2022-04-18 DIAGNOSIS — D509 Iron deficiency anemia, unspecified: Secondary | ICD-10-CM | POA: Insufficient documentation

## 2022-04-18 DIAGNOSIS — I10 Essential (primary) hypertension: Secondary | ICD-10-CM | POA: Diagnosis not present

## 2022-04-18 DIAGNOSIS — Z7982 Long term (current) use of aspirin: Secondary | ICD-10-CM | POA: Diagnosis not present

## 2022-04-18 DIAGNOSIS — C779 Secondary and unspecified malignant neoplasm of lymph node, unspecified: Secondary | ICD-10-CM | POA: Insufficient documentation

## 2022-04-18 DIAGNOSIS — M25511 Pain in right shoulder: Secondary | ICD-10-CM | POA: Diagnosis not present

## 2022-04-18 DIAGNOSIS — D500 Iron deficiency anemia secondary to blood loss (chronic): Secondary | ICD-10-CM | POA: Insufficient documentation

## 2022-04-18 DIAGNOSIS — C20 Malignant neoplasm of rectum: Secondary | ICD-10-CM | POA: Diagnosis not present

## 2022-04-18 DIAGNOSIS — D75839 Thrombocytosis, unspecified: Secondary | ICD-10-CM | POA: Insufficient documentation

## 2022-04-18 DIAGNOSIS — Z5112 Encounter for antineoplastic immunotherapy: Secondary | ICD-10-CM | POA: Diagnosis not present

## 2022-04-18 DIAGNOSIS — Z79899 Other long term (current) drug therapy: Secondary | ICD-10-CM | POA: Insufficient documentation

## 2022-04-18 MED ORDER — ACETAMINOPHEN 325 MG PO TABS
650.0000 mg | ORAL_TABLET | Freq: Once | ORAL | Status: AC
  Administered 2022-04-18: 650 mg via ORAL

## 2022-04-18 MED ORDER — HEPARIN SOD (PORK) LOCK FLUSH 100 UNIT/ML IV SOLN
500.0000 [IU] | Freq: Once | INTRAVENOUS | Status: AC | PRN
  Administered 2022-04-18: 500 [IU]
  Filled 2022-04-18: qty 5

## 2022-04-18 MED ORDER — DIPHENHYDRAMINE HCL 25 MG PO CAPS
25.0000 mg | ORAL_CAPSULE | Freq: Once | ORAL | Status: AC
  Administered 2022-04-18: 25 mg via ORAL

## 2022-04-18 MED ORDER — SODIUM CHLORIDE 0.9 % IV SOLN
200.0000 mg | Freq: Once | INTRAVENOUS | Status: AC
  Administered 2022-04-18: 200 mg via INTRAVENOUS
  Filled 2022-04-18: qty 200

## 2022-04-18 NOTE — Addendum Note (Signed)
Encounter addended by: Baxter Hire, RN on: 04/18/2022 3:10 PM  Actions taken: Therapy plan modified

## 2022-04-18 NOTE — Progress Notes (Signed)
Diagnosis: Iron Deficiency Anemia  Provider:   Narda Rutherford IV MD  Procedure: Infusion  IV Type: Port a Cath, IV Location: R Chest  Venofer (Iron Sucrose), Dose: 200 mg  Infusion Start Time: 1215  Infusion Stop Time: 1234  Post Infusion IV Care: Observation period completed and Port a Cath Deaccessed/Flushed  Discharge: Condition: Good, Destination: Home . AVS Provided  Performed by:  Baxter Hire, RN

## 2022-04-20 ENCOUNTER — Ambulatory Visit: Payer: Medicare HMO

## 2022-04-22 MED FILL — Dexamethasone Sodium Phosphate Inj 100 MG/10ML: INTRAMUSCULAR | Qty: 1 | Status: AC

## 2022-04-22 NOTE — Progress Notes (Signed)
Radiation Oncology         ([phone]) [phone] ________________________________  Name: Joseph Hogan Hogan        MRN: [id number]  Date of Service: 04/24/2022 DOB: [birth year]  [id number], Joseph Hogan Lamb, Hogan  Joseph Slick, Hogan     REFERRING PHYSICIAN: [name], Hogan   Hogan: Joseph Hogan Hogan was Rectal carcinoma [facility]). A Hogan of Secondary malignant neoplasm of Hogan [facility]) was also pertinent to this visit.   HISTORY OF PRESENT ILLNESS: Joseph Hogan Hogan is a 62 y.o. male seen at Joseph Hogan Hogan for a newly diagnosed rectal cancer. Joseph Hogan Hogan originally Hogan due to pain in his Hogan thigh in November 2023, there was no Hogan seen in plain films of Joseph Hogan Hogan.  He fell and had plain films on 02/03/2022 that showed an acute subcapital Hogan femoral Hogan Hogan a CT on 02/04/2022 showed Joseph Hogan Hogan with displacement and Hogan with underlying lytic lesions of Joseph Hogan Hogan, additional lytic destructive changes of Joseph Hogan Hogan extending to Joseph Hogan Hogan and pubic acetabular junction with pathologic Hogan of Joseph Hogan Hogan was also noted.  There was hypoattenuation in Joseph Hogan Hogan concerning for disease and a small lytic lesion in Joseph Hogan Hogan, a 1.2 cm Hogan iliac lymph Hogan, and on 02/05/2022 he underwent a CT of Joseph Hogan Hogan that showed pathologic retroperitoneal and bilateral pelvic adenopathy with destructive lesions in Joseph Hogan pubic Hogan and Hogan as previously described as well as pathologic Hogan through Joseph Hogan Hogan, and right second Hogan with pathologic Hogan.  Deformity anterior to Joseph Hogan Hogan was felt to be an early pathologic Hogan.  There was enlarged supraclavicular adenopathy on Joseph Hogan Hogan and lower thoracic periaortic Hogan, retroperitoneal and pelvic adenopathy and 10 cm of  rectal wall thickening a 1.1 cm exophytic lesion from Joseph Hogan Hogan kidney was also noted.  Wall thickening of Joseph Hogan Hogan was also seen.  Joseph Hogan Hogan on 02/06/2022, and pathology from this showed poorly differentiated carcinoma. On 02/25/2022 he underwent upper endoscopy and colonoscopy with Joseph Hogan Hogan.  EGD was negative for any abnormalities, colonoscopy however revealed a medium size polypoid and ulcerated mass in Joseph Hogan Hogan measuring 4 cm in length.  Biopsies were obtained and were consistent with poorly differentiated adenocarcinoma.  He started cycle 1 day 1 of FOLFOX on 04/08/2022.  Avastin is planned to be added to this regimen for subsequent cycles.  Imaging of Joseph Hogan Hogan due to pain on 04/08/2022 showed a destructive lesion in Joseph Hogan Hogan with associated acute to subacute pathologic Hogan.  He did also undergo intramedullary nail placement in Joseph Hogan humerus on Joseph Hogan right Hogan on 04/12/2022.  He has a PET scan scheduled as well, and due to Joseph Hogan Hogan of his humeral lesion is seen to consider palliative radiation as well.    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   with abnormal R wave progression noted on EKG   MRSA (methicillin resistant Staphylococcus aureus)    Pre-diabetes    Rectal carcinoma ([facility]) 03/19/2022       PAST SURGICAL  HISTORY: Past Surgical History:  Procedure Laterality Date   COLONOSCOPY     HUMERUS IM NAIL Right 04/12/2022   Procedure: INTRAMEDULLARY (IM) NAIL HUMERAL;  Surgeon: Joseph Hogan Hogan;  Location: [facility];  Service: Orthopedics;  Laterality: Right;   IR IMAGING GUIDED PORT INSERTION  04/05/2022   TOTAL HIP Hogan Hogan 02/06/2022   Procedure: TOTAL HIP Hogan;  Surgeon: Joseph Hogan Hogan;   Location: [facility];  Service: Orthopedics;  Laterality: Hogan;     FAMILY HISTORY:  Family History  Problem Relation Age of Onset   Stroke Mother    Hypertension Mother    Heart disease Father    Alzheimer's disease Father    Hypertension Brother    Alcohol abuse Brother    Cancer Maternal Aunt    Heart disease Maternal Aunt    Cancer Maternal Uncle    Heart disease Maternal Uncle    Cancer Paternal Aunt    Heart disease Paternal Aunt    Cancer Paternal Uncle    Heart disease Paternal Uncle    Heart disease Paternal Grandmother    Alzheimer's disease Paternal Grandfather    Stomach cancer Neg Hx    Rectal cancer Neg Hx    Esophageal cancer Neg Hx    Colon cancer Neg Hx      SOCIAL HISTORY:  reports that he has never smoked. He has never used smokeless tobacco. He reports that he does not drink alcohol and does not use drugs.  Joseph Hogan Hogan is divorced and lives in [place].  His daughter Joseph Hogan Hogan accompanies him.    ALLERGIES: Codeine and Lisinopril   MEDICATIONS:  Current Outpatient Medications  Medication Sig Dispense Refill   acetaminophen (TYLENOL) 500 MG tablet Take 2 tablets (1,000 mg total) by mouth every 8 (eight) hours for 14 days. 84 tablet 0   aspirin (ASPIRIN CHILDRENS) 81 MG chewable tablet Chew 1 tablet (81 mg total) by mouth 2 (two) times daily. For 6 weeks for DVT prophylaxis after surgery 84 tablet 0   b complex vitamins capsule Take 1 capsule by mouth daily.     HYDROmorphone (DILAUDID) 2 MG tablet Take 2 mg by mouth every 4 (four) hours as needed for severe pain.     lidocaine-prilocaine (EMLA) cream Apply 1 Application topically as needed. 30 g 0   methocarbamol (ROBAXIN) 500 MG tablet Take 1 tablet (500 mg total) by mouth every 8 (eight) hours as needed for Hogan spasms. 30 tablet 0   Multiple Vitamin (MULTIVITAMIN WITH MINERALS) TABS tablet Take 1 tablet by mouth daily.     ondansetron (ZOFRAN) 8 MG tablet Take 11.5 tablets (92 mg total) by mouth  every 8 (eight) hours as needed for nausea or vomiting. (Hogan taking differently: Take 8-16 mg by mouth every 8 (eight) hours as needed for nausea or vomiting.) 90 tablet 0   oxyCODONE ER (XTAMPZA ER) 9 MG C12A Take 9 mg by mouth every 12 (twelve) hours as needed. 60 capsule 0   prochlorperazine (COMPAZINE) 10 MG tablet Take 1 tablet (10 mg total) by mouth every 6 (six) hours as needed for nausea or vomiting. 90 tablet 0   VITAMIN A PO Take 1 tablet by mouth daily.     No current facility-administered medications for this encounter.     REVIEW OF SYSTEMS: On review of systems, Joseph Hogan Hogan reports that he is having pain in his right arm and Hogan anteriorly. He is taking long and short acting pian medication and this helps  improve this somewhat. He is not able to externally rotate his arm. He denies any pain in his legs, Hogan, hips, or back. He does have arthritis in his knees bilaterally which predate his symptoms in Joseph Hogan leg or arm. No rectal bleeding, pelvic pain, or changes in bowel habits were noted. He reports this colonoscopy to diagnose his cancer was his first colonoscopy ever. No other complaints are verbalized.   PHYSICAL EXAM:  Wt Readings from Last 3 Encounters:  04/24/22 208 lb 3.2 oz (94.4 kg)  04/12/22 211 lb (95.7 kg)  04/05/22 211 lb (95.7 kg)   Temp Readings from Last 3 Encounters:  04/24/22 97.7 F (36.5 C)  04/23/22 98 F (36.7 C) (Oral)  04/18/22 98.2 F (36.8 C) (Oral)   BP Readings from Last 3 Encounters:  04/24/22 (!) 153/86  04/23/22 139/88  04/18/22 133/86   Pulse Readings from Last 3 Encounters:  04/24/22 95  04/23/22 100  04/23/22 (!) 102   Pain Assessment Pain Score: 8 /10  In general this is a well appearing Caucasian male in no acute distress. He's alert and oriented x4 and appropriate throughout Joseph Hogan examination. Cardiopulmonary assessment is negative for acute distress and he exhibits normal effort.     ECOG = 1  0 - Asymptomatic  (Fully active, able to carry on all predisease activities without restriction)  1 - Symptomatic but completely ambulatory (Restricted in physically strenuous activity but ambulatory and able to carry out work of a light or sedentary nature. For example, light housework, office work)  2 - Symptomatic, <50% in bed during Joseph Hogan day (Ambulatory and capable of all self care but unable to carry out any work activities. Up and about more than 50% of waking hours)  3 - Symptomatic, >50% in bed, but not bedbound (Capable of only limited self-care, confined to bed or chair 50% or more of waking hours)  4 - Bedbound (Completely disabled. Cannot carry on any self-care. Totally confined to bed or chair)  5 - Death   Eustace Pen MM, Creech RH, Tormey DC, et al. [phone]). "Toxicity and response criteria of Joseph Hogan [facility]". [place] Oncol. 5 (6): 649-55    LABORATORY DATA:  Lab Results  Component Value Date   WBC 6.7 04/23/2022   HGB 9.9 (L) 04/23/2022   HCT 31.9 (L) 04/23/2022   MCV 79.8 (L) 04/23/2022   PLT 386 04/23/2022   Lab Results  Component Value Date   NA 136 04/23/2022   K 4.1 04/23/2022   CL 101 04/23/2022   CO2 27 04/23/2022   Lab Results  Component Value Date   ALT 13 04/23/2022   AST 29 04/23/2022   ALKPHOS 316 (H) 04/23/2022   BILITOT 0.6 04/23/2022      RADIOGRAPHY: DG Hogan Right  Result Date: 04/12/2022 CLINICAL DATA:  Humerus Hogan. Intraoperative right humerus intramedullary nail. EXAM: RIGHT Hogan - 2+ VIEW COMPARISON:  Right Hogan radiographs 04/08/2022 FINDINGS: Images were performed intraoperatively without Joseph Hogan presence of a radiologist. Permanent lytic humeral lesion is again demonstrated. Joseph Hogan Hogan is undergoing intramedullary nail fixation of Joseph Hogan humeral head through Joseph Hogan distal humeral diaphysis. No hardware complication is seen. Total fluoroscopy images: 6 Total fluoroscopy time: 83 seconds Total dose: Radiation Exposure Index (as  provided by Joseph Hogan fluoroscopic device): 7.2 mGy air Kerma Please see intraoperative findings for further detail. IMPRESSION: Intraoperative images during intramedullary nail fixation of Joseph Hogan right humerus. Electronically Signed   By: Yvonne Kendall M.D.   On: 04/12/2022  14:35   DG C-Arm 1-60 Min-No Report  Result Date: 04/12/2022 Fluoroscopy was utilized by Joseph Hogan requesting physician.  No radiographic interpretation.   DG C-Arm 1-60 Min-No Report  Result Date: 04/12/2022 Fluoroscopy was utilized by Joseph Hogan requesting physician.  No radiographic interpretation.   DG Hogan Right  Result Date: 04/08/2022 CLINICAL DATA:  Stage IV Hogan cancer. Pleuritic pop and opening a door. Large swollen knot area in biceps. Hogan reports he has had this pain for weeks. EXAM: RIGHT Hogan - 2+ VIEW COMPARISON:  Right Hogan radiographs 12/06/2011 FINDINGS: Moderate glenohumeral joint space narrowing and inferior chronic degenerative osteophytosis. Mild acromioclavicular joint space narrowing and peripheral osteophytosis. Moderate lateral downsloping of Joseph Hogan acromion. There are mottled lucencies within Joseph Hogan proximal to mid humeral diaphysis along an approximate 9 cm length. There appears to be an oblique linear lucency extending in a superomedial to inferolateral orientation within Joseph Hogan distal aspect of this permeative destructive lesion suggesting an acute to subacute Hogan. There is mild anterior apex Hogan of Joseph Hogan Hogan line on lateral view. Right chest wall porta catheter is partially visualized. IMPRESSION: Permeative, destructive lesion within Joseph Hogan Hogan and associated acute to subacute pathologic Hogan without significant displacement but with mild anterior apex Hogan. Electronically Signed   By: Yvonne Kendall M.D.   On: 04/08/2022 18:34   IR IMAGING GUIDED PORT INSERTION  Result Date: 04/05/2022 CLINICAL DATA:  Metastatic colorectal carcinoma and need for porta cath for  chemotherapy. EXAM: IMPLANTED PORT A CATH PLACEMENT WITH ULTRASOUND AND FLUOROSCOPIC GUIDANCE ANESTHESIA/SEDATION: Moderate (conscious) sedation was employed during this procedure. A total of Versed 4.0 mg and Fentanyl 100 mcg was administered intravenously. Moderate Sedation Time: 40 minutes. Joseph Hogan Hogan's level of consciousness and vital signs were monitored continuously by radiology nursing throughout Joseph Hogan procedure under my direct supervision. FLUOROSCOPY: 5.0 mGy PROCEDURE: Joseph Hogan procedure, risks, benefits, and alternatives were explained to Joseph Hogan Hogan. Questions regarding Joseph Hogan procedure were encouraged and answered. Joseph Hogan Hogan understands and consents to Joseph Hogan procedure. A time-out was performed prior to initiating Joseph Hogan procedure. Ultrasound was utilized to confirm patency of Joseph Hogan right internal jugular vein. Joseph Hogan right Hogan and chest were prepped with chlorhexidine in a sterile fashion, and a sterile drape was applied covering Joseph Hogan operative field. Maximum barrier sterile technique with sterile gowns and gloves were used for Joseph Hogan procedure. Local anesthesia was provided with 1% lidocaine. After creating a small venotomy incision, a 21 gauge needle was advanced into Joseph Hogan right internal jugular vein under direct, real-time ultrasound guidance. Ultrasound image documentation was performed. After securing guidewire access, an 8 Fr dilator was placed. A J-wire was kinked to measure appropriate catheter length. A subcutaneous port pocket was then created along Joseph Hogan upper chest wall utilizing sharp and blunt dissection. Portable cautery was utilized. Joseph Hogan pocket was irrigated with sterile saline. A single lumen power injectable port was chosen for placement. Joseph Hogan 8 Fr catheter was tunneled from Joseph Hogan port pocket site to Joseph Hogan venotomy incision. Joseph Hogan port was placed in Joseph Hogan pocket. External catheter was trimmed to appropriate length based on guidewire measurement. At Joseph Hogan venotomy, an 8 Fr peel-away sheath was placed over a guidewire.  Joseph Hogan catheter was then placed through Joseph Hogan sheath and Joseph Hogan sheath removed. Final catheter positioning was confirmed and documented with a fluoroscopic spot image. Joseph Hogan port was accessed with a needle and aspirated and flushed with heparinized saline. Joseph Hogan access needle was removed. Joseph Hogan venotomy and port pocket incisions were closed with subcutaneous 3-0 Monocryl and subcuticular 4-0 Vicryl.  Dermabond was applied to both incisions. COMPLICATIONS: COMPLICATIONS None FINDINGS: After catheter placement, Joseph Hogan tip lies at Joseph Hogan cavo-atrial junction. Joseph Hogan catheter aspirates normally and is ready for immediate use. IMPRESSION: Placement of single lumen port a cath via right internal jugular vein. Joseph Hogan catheter tip lies at Joseph Hogan cavo-atrial junction. A power injectable port a cath was placed and is ready for immediate use. Electronically Signed   By: Aletta Edouard M.D.   On: 04/05/2022 16:46       IMPRESSION/PLAN: 1. 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 Dr. Lisbeth Renshaw discusses Joseph Hogan pathology findings and reviews Joseph Hogan workup of his Hogan to date.  He is just getting started on systemic therapy but having PET imaging would improve our ability to consider treatment.  Dr. Lisbeth Renshaw discussed Joseph Hogan applications of radiotherapy in rectal cancer.  At this point, Dr. Lisbeth Renshaw recommends palliative radiotherapy for his right humerus, and Hogan Hogan.  We discussed Joseph Hogan risks, benefits, short, and long term effects of radiotherapy, as well as Joseph Hogan palliative intent, and Joseph Hogan Hogan is interested in proceeding. Dr. Lisbeth Renshaw discusses Joseph Hogan delivery and logistics of radiotherapy and anticipates a course of 3 weeks of radiotherapy given his ongoing systemic chemotherapy. Written consent is obtained and placed in Joseph Hogan chart, a copy was provided to Joseph Hogan Hogan. He will simualte once we know Joseph Hogan date of his PET scan. 2. Painful Hogan disease as per #1.  He will continue his current regimen of pain management and we  anticipate improvement with palliative radiotherapy as above.  In a visit lasting 60 minutes, greater than 50% of Joseph Hogan time was spent face to face discussing Joseph Hogan Hogan's condition, in preparation for Joseph Hogan discussion, and coordinating Joseph Hogan Hogan's care.   Joseph Hogan above documentation reflects my direct findings during this shared Hogan visit. Please see Joseph Hogan separate note by Dr. Lisbeth Renshaw on this date for Joseph Hogan remainder of Joseph Hogan Hogan's plan of care.    Carola Rhine, [facility]   **Disclaimer: This note was dictated with voice recognition software. Similar sounding words can inadvertently be transcribed and this note may contain transcription errors which may not have been corrected upon publication of note.**

## 2022-04-23 ENCOUNTER — Inpatient Hospital Stay (HOSPITAL_BASED_OUTPATIENT_CLINIC_OR_DEPARTMENT_OTHER): Payer: Medicare HMO | Admitting: Physician Assistant

## 2022-04-23 ENCOUNTER — Inpatient Hospital Stay: Payer: Medicare HMO

## 2022-04-23 ENCOUNTER — Inpatient Hospital Stay: Payer: Medicare HMO | Attending: Physician Assistant

## 2022-04-23 VITALS — HR 100

## 2022-04-23 VITALS — BP 139/88 | HR 102 | Temp 98.0°F | Resp 18

## 2022-04-23 DIAGNOSIS — I10 Essential (primary) hypertension: Secondary | ICD-10-CM | POA: Insufficient documentation

## 2022-04-23 DIAGNOSIS — D75839 Thrombocytosis, unspecified: Secondary | ICD-10-CM | POA: Insufficient documentation

## 2022-04-23 DIAGNOSIS — C779 Secondary and unspecified malignant neoplasm of lymph node, unspecified: Secondary | ICD-10-CM | POA: Diagnosis not present

## 2022-04-23 DIAGNOSIS — M25511 Pain in right shoulder: Secondary | ICD-10-CM | POA: Insufficient documentation

## 2022-04-23 DIAGNOSIS — Z5111 Encounter for antineoplastic chemotherapy: Secondary | ICD-10-CM | POA: Diagnosis not present

## 2022-04-23 DIAGNOSIS — C7951 Secondary malignant neoplasm of bone: Secondary | ICD-10-CM | POA: Insufficient documentation

## 2022-04-23 DIAGNOSIS — Z7982 Long term (current) use of aspirin: Secondary | ICD-10-CM | POA: Insufficient documentation

## 2022-04-23 DIAGNOSIS — Z79899 Other long term (current) drug therapy: Secondary | ICD-10-CM | POA: Insufficient documentation

## 2022-04-23 DIAGNOSIS — Z95828 Presence of other vascular implants and grafts: Secondary | ICD-10-CM

## 2022-04-23 DIAGNOSIS — D508 Other iron deficiency anemias: Secondary | ICD-10-CM | POA: Diagnosis not present

## 2022-04-23 DIAGNOSIS — Z5112 Encounter for antineoplastic immunotherapy: Secondary | ICD-10-CM | POA: Diagnosis not present

## 2022-04-23 DIAGNOSIS — D509 Iron deficiency anemia, unspecified: Secondary | ICD-10-CM | POA: Insufficient documentation

## 2022-04-23 DIAGNOSIS — C20 Malignant neoplasm of rectum: Secondary | ICD-10-CM

## 2022-04-23 DIAGNOSIS — D500 Iron deficiency anemia secondary to blood loss (chronic): Secondary | ICD-10-CM | POA: Diagnosis not present

## 2022-04-23 LAB — CBC WITH DIFFERENTIAL (CANCER CENTER ONLY)
Abs Immature Granulocytes: 0.04 10*3/uL (ref 0.00–0.07)
Basophils Absolute: 0.1 10*3/uL (ref 0.0–0.1)
Basophils Relative: 1 %
Eosinophils Absolute: 0.2 10*3/uL (ref 0.0–0.5)
Eosinophils Relative: 2 %
HCT: 31.9 % — ABNORMAL LOW (ref 39.0–52.0)
Hemoglobin: 9.9 g/dL — ABNORMAL LOW (ref 13.0–17.0)
Immature Granulocytes: 1 %
Lymphocytes Relative: 20 %
Lymphs Abs: 1.4 10*3/uL (ref 0.7–4.0)
MCH: 24.8 pg — ABNORMAL LOW (ref 26.0–34.0)
MCHC: 31 g/dL (ref 30.0–36.0)
MCV: 79.8 fL — ABNORMAL LOW (ref 80.0–100.0)
Monocytes Absolute: 0.7 10*3/uL (ref 0.1–1.0)
Monocytes Relative: 11 %
Neutro Abs: 4.4 10*3/uL (ref 1.7–7.7)
Neutrophils Relative %: 65 %
Platelet Count: 386 10*3/uL (ref 150–400)
RBC: 4 MIL/uL — ABNORMAL LOW (ref 4.22–5.81)
RDW: 19.1 % — ABNORMAL HIGH (ref 11.5–15.5)
WBC Count: 6.7 10*3/uL (ref 4.0–10.5)
nRBC: 0 % (ref 0.0–0.2)

## 2022-04-23 LAB — TOTAL PROTEIN, URINE DIPSTICK: Protein, ur: NEGATIVE mg/dL

## 2022-04-23 LAB — CMP (CANCER CENTER ONLY)
ALT: 13 U/L (ref 0–44)
AST: 29 U/L (ref 15–41)
Albumin: 3.5 g/dL (ref 3.5–5.0)
Alkaline Phosphatase: 316 U/L — ABNORMAL HIGH (ref 38–126)
Anion gap: 8 (ref 5–15)
BUN: 15 mg/dL (ref 8–23)
CO2: 27 mmol/L (ref 22–32)
Calcium: 8.9 mg/dL (ref 8.9–10.3)
Chloride: 101 mmol/L (ref 98–111)
Creatinine: 0.49 mg/dL — ABNORMAL LOW (ref 0.61–1.24)
GFR, Estimated: >60 mL/min (ref >60)
Glucose, Bld: 155 mg/dL — ABNORMAL HIGH (ref 70–99)
Potassium: 4.1 mmol/L (ref 3.5–5.1)
Sodium: 136 mmol/L (ref 135–145)
Total Bilirubin: 0.6 mg/dL (ref 0.3–1.2)
Total Protein: 7.3 g/dL (ref 6.5–8.1)

## 2022-04-23 MED ORDER — SODIUM CHLORIDE 0.9 % IV SOLN
5.0000 mg/kg | Freq: Once | INTRAVENOUS | Status: AC
  Administered 2022-04-23: 500 mg via INTRAVENOUS
  Filled 2022-04-23: qty 4

## 2022-04-23 MED ORDER — LEUCOVORIN CALCIUM INJECTION 350 MG
400.0000 mg/m2 | Freq: Once | INTRAVENOUS | Status: AC
  Administered 2022-04-23: 876 mg via INTRAVENOUS
  Filled 2022-04-23: qty 43.8

## 2022-04-23 MED ORDER — PALONOSETRON HCL INJECTION 0.25 MG/5ML
0.2500 mg | Freq: Once | INTRAVENOUS | Status: AC
  Administered 2022-04-23: 0.25 mg via INTRAVENOUS
  Filled 2022-04-23: qty 5

## 2022-04-23 MED ORDER — SODIUM CHLORIDE 0.9% FLUSH
10.0000 mL | Freq: Once | INTRAVENOUS | Status: AC
  Administered 2022-04-23: 10 mL

## 2022-04-23 MED ORDER — HEPARIN SOD (PORK) LOCK FLUSH 100 UNIT/ML IV SOLN: 500.0000 [IU] | Freq: Once | INTRAVENOUS | Status: DC | PRN

## 2022-04-23 MED ORDER — SODIUM CHLORIDE 0.9 % IV SOLN: Freq: Once | INTRAVENOUS | Status: AC

## 2022-04-23 MED ORDER — FLUOROURACIL CHEMO INJECTION 2.5 GM/50ML
400.0000 mg/m2 | Freq: Once | INTRAVENOUS | Status: AC
  Administered 2022-04-23: 900 mg via INTRAVENOUS
  Filled 2022-04-23: qty 18

## 2022-04-23 MED ORDER — OXALIPLATIN CHEMO INJECTION 100 MG/20ML
85.0000 mg/m2 | Freq: Once | INTRAVENOUS | Status: AC
  Administered 2022-04-23: 200 mg via INTRAVENOUS
  Filled 2022-04-23: qty 40

## 2022-04-23 MED ORDER — SODIUM CHLORIDE 0.9 % IV SOLN
10.0000 mg | Freq: Once | INTRAVENOUS | Status: AC
  Administered 2022-04-23: 10 mg via INTRAVENOUS
  Filled 2022-04-23: qty 10

## 2022-04-23 MED ORDER — DEXTROSE 5 % IV SOLN: Freq: Once | INTRAVENOUS | Status: AC

## 2022-04-23 MED ORDER — SODIUM CHLORIDE 0.9% FLUSH
10.0000 mL | INTRAVENOUS | Status: DC | PRN
  Administered 2022-04-23: 10 mL

## 2022-04-23 MED ORDER — SODIUM CHLORIDE 0.9 % IV SOLN
2400.0000 mg/m2 | INTRAVENOUS | Status: DC
  Administered 2022-04-23: 5000 mg via INTRAVENOUS
  Filled 2022-04-23: qty 100

## 2022-04-23 NOTE — Patient Instructions (Addendum)
[facility]  Discharge Instructions: Thank you for choosing [facility] to provide your oncology and hematology care.   If you have a lab appointment with the [facility], please go directly to the [facility] and check in at the registration area.   Wear comfortable clothing and clothing appropriate for easy access to any Portacath or PICC line.   We strive to give you quality time with your provider. You may need to reschedule your appointment if you arrive late (15 or more minutes).  Arriving late affects you and other patients whose appointments are after yours.  Also, if you miss three or more appointments without notifying the office, you may be dismissed from the clinic at the provider's discretion.      For prescription refill requests, have your pharmacy contact our office and allow 72 hours for refills to be completed.    Today you received the following chemotherapy and/or immunotherapy agents: Oxaliplatin/Leucovorin/Bevacizumab/Fluorouracil    To help prevent nausea and vomiting after your treatment, we encourage you to take your nausea medication as directed.  BELOW ARE SYMPTOMS THAT SHOULD BE REPORTED IMMEDIATELY: *FEVER GREATER THAN 100.4 F (38 C) OR HIGHER *CHILLS OR SWEATING *NAUSEA AND VOMITING THAT IS NOT CONTROLLED WITH YOUR NAUSEA MEDICATION *UNUSUAL SHORTNESS OF BREATH *UNUSUAL BRUISING OR BLEEDING *URINARY PROBLEMS (pain or burning when urinating, or frequent urination) *BOWEL PROBLEMS (unusual diarrhea, constipation, pain near the anus) TENDERNESS IN MOUTH AND THROAT WITH OR WITHOUT PRESENCE OF ULCERS (sore throat, sores in mouth, or a toothache) UNUSUAL RASH, SWELLING OR PAIN  UNUSUAL VAGINAL DISCHARGE OR ITCHING   Items with * indicate a potential emergency and should be followed up as soon as possible or go to the Emergency Department if any problems should occur.  Please show the CHEMOTHERAPY ALERT CARD  or IMMUNOTHERAPY ALERT CARD at check-in to the Emergency Department and triage nurse.  Should you have questions after your visit or need to cancel or reschedule your appointment, please contact [facility]  Dept: [phone]  and follow the prompts.  Office hours are 8:00 a.m. to 4:30 p.m. Monday - Friday. Please note that voicemails left after 4:00 p.m. may not be returned until the following business day.  We are closed weekends and major holidays. You have access to a nurse at all times for urgent questions. Please call the main number to the clinic Dept: [phone] and follow the prompts.   For any non-urgent questions, you may also contact your provider using MyChart. We now offer e-Visits for anyone 39 and older to request care online for non-urgent symptoms. For details visit [email or web address].   Also download the MyChart app! Go to the app store, search "MyChart", open the app, select [facility], and log in with your MyChart username and password.  The chemotherapy medication bag should finish at 46 hours, 96 hours, or 7 days. For example, if your pump is scheduled for 46 hours and it was put on at 4:00 p.m., it should finish at 2:00 p.m. the day it is scheduled to come off regardless of your appointment time.     Estimated time to finish at approximately 3:00 PM on 04/25/2022.   If the display on your pump reads "Low Volume" and it is beeping, take the batteries out of the pump and come to the cancer center for it to be taken off.   If the pump alarms go off prior  to the pump reading "Low Volume" then call [phone] and someone can assist you.  If the plunger comes out and the chemotherapy medication is leaking out, please use your home chemo spill kit to clean up the spill. Do NOT use paper towels or other household products.  If you have problems or questions regarding your pump, please call either [phone] (24 hours a day) or  the cancer center Monday-Friday 8:00 a.m.- 4:30 p.m. at the clinic number and we will assist you. If you are unable to get assistance, then go to the nearest Emergency Department and ask the staff to contact the IV team for assistance.

## 2022-04-23 NOTE — Progress Notes (Signed)
GI Location of Tumor / Histology: Rectal Cancer with bone mets  Joseph Hogan presented to the ED in November with complaints of left thigh pain, no injury noted on his plain films.  He presented again in December 2023 after a fall and imaging was obtained that showed an acute subcapital left femoral neck fracture.  Location(s) of Symptomatic Metastases: Right proximal/mid humerus  Right Shoulder Xray 04/08/2022: Permeative, destructive lesion within the proximal to mid right humeral shaft and associated acute to subacute pathologic fracture without significant displacement but with mild anterior apex angulation.  Colonoscopy 02/25/2022: Medium sized polypoid and ulcerated mass in the rectum measuring 4 cm in length.  EGD 02/25/2022:    Biopsies of Rectal Mass 02/25/2022  Biopsies of Femoral Head 02/06/2022    Past/Anticipated interventions by surgeon, if any:  Dr. Griffin Basil- Right Intramedullary Humeral Nail 04/12/2022 -Dr. Zachery Dakins- Total Left Hip Arthroplasty 02/06/2022   Past/Anticipated interventions by medical oncology, if any:  Dede Query PA 04/08/2022 -Cycle 1 Chemo start 04/08/2022 FOLFOX -Patient requested to transfer to [facility] in the future since patient lives in [place].  -If there is confirmed metastatic disease, we will make referral to palliative radiation.    Weight changes, if any: He reports 25 pound weight loss in the last month+  Bowel/Bladder complaints, if any: Denies changes to his bowels or bladder.  Nausea / Vomiting, if any: Denies nausea or vomiting  Pain issues, if any:  Right arm pain.   If Spine Met(s), symptoms, if any, include: Numbness or weakness in extremities (please describe): Tingling in the fingers that started last night.   Ambulatory status? Walker? Wheelchair?: Ambulatory at home, uses wheelchair for longer distances.  SAFETY ISSUES: Prior radiation? No Pacemaker/ICD? No Possible current pregnancy? N/a Is the  patient on methotrexate? No  Current Complaints/Details: Port Inserted

## 2022-04-23 NOTE — Progress Notes (Signed)
Per Murray Hodgkins PA-C HOLD Bevaciuzumab infusion until baseline urine protein obtained; OK to proceed with Oxaliplatin and Leucovorin infusion. This RN confirmed with Ginna RPH OK to administer Bevacizumab after Oxaliplatin and Leucovorin infusion today once urine protein has resulted.

## 2022-04-24 ENCOUNTER — Other Ambulatory Visit: Payer: Self-pay

## 2022-04-24 ENCOUNTER — Ambulatory Visit: Admission: RE | Admit: 2022-04-24 | Payer: Medicare HMO | Source: Ambulatory Visit | Admitting: Radiation Oncology

## 2022-04-24 ENCOUNTER — Ambulatory Visit
Admission: RE | Admit: 2022-04-24 | Discharge: 2022-04-24 | Disposition: A | Discharge status: Home / Self Care | Payer: Medicare HMO | Source: Ambulatory Visit | Attending: Radiation Oncology | Admitting: Radiation Oncology

## 2022-04-24 ENCOUNTER — Encounter: Payer: Self-pay | Admitting: Radiation Oncology

## 2022-04-24 ENCOUNTER — Other Ambulatory Visit: Payer: Self-pay | Admitting: Physician Assistant

## 2022-04-24 ENCOUNTER — Other Ambulatory Visit: Payer: Self-pay | Admitting: Radiation Oncology

## 2022-04-24 VITALS — BP 153/86 | HR 95 | Temp 97.7°F | Resp 20 | Ht 70.0 in | Wt 208.2 lb

## 2022-04-24 DIAGNOSIS — D509 Iron deficiency anemia, unspecified: Secondary | ICD-10-CM | POA: Insufficient documentation

## 2022-04-24 DIAGNOSIS — C7951 Secondary malignant neoplasm of bone: Secondary | ICD-10-CM

## 2022-04-24 DIAGNOSIS — C20 Malignant neoplasm of rectum: Secondary | ICD-10-CM

## 2022-04-24 DIAGNOSIS — Z809 Family history of malignant neoplasm, unspecified: Secondary | ICD-10-CM | POA: Diagnosis not present

## 2022-04-24 DIAGNOSIS — I10 Essential (primary) hypertension: Secondary | ICD-10-CM | POA: Diagnosis not present

## 2022-04-24 DIAGNOSIS — M129 Arthropathy, unspecified: Secondary | ICD-10-CM | POA: Diagnosis not present

## 2022-04-24 DIAGNOSIS — Z79899 Other long term (current) drug therapy: Secondary | ICD-10-CM | POA: Diagnosis not present

## 2022-04-25 ENCOUNTER — Encounter (HOSPITAL_COMMUNITY)
Admission: RE | Admit: 2022-04-25 | Discharge: 2022-04-25 | Disposition: A | Discharge status: Home / Self Care | Payer: Medicare HMO | Source: Ambulatory Visit | Attending: Hematology and Oncology | Admitting: Hematology and Oncology

## 2022-04-25 ENCOUNTER — Inpatient Hospital Stay: Payer: Medicare HMO

## 2022-04-25 ENCOUNTER — Other Ambulatory Visit: Payer: Self-pay | Admitting: Hematology and Oncology

## 2022-04-25 VITALS — BP 135/76 | HR 88 | Temp 99.5°F | Resp 24

## 2022-04-25 DIAGNOSIS — D500 Iron deficiency anemia secondary to blood loss (chronic): Secondary | ICD-10-CM

## 2022-04-25 DIAGNOSIS — M25511 Pain in right shoulder: Secondary | ICD-10-CM | POA: Diagnosis not present

## 2022-04-25 DIAGNOSIS — Z5111 Encounter for antineoplastic chemotherapy: Secondary | ICD-10-CM | POA: Diagnosis not present

## 2022-04-25 DIAGNOSIS — C20 Malignant neoplasm of rectum: Secondary | ICD-10-CM | POA: Diagnosis not present

## 2022-04-25 DIAGNOSIS — D75839 Thrombocytosis, unspecified: Secondary | ICD-10-CM | POA: Diagnosis not present

## 2022-04-25 DIAGNOSIS — Z5112 Encounter for antineoplastic immunotherapy: Secondary | ICD-10-CM | POA: Diagnosis not present

## 2022-04-25 DIAGNOSIS — I10 Essential (primary) hypertension: Secondary | ICD-10-CM | POA: Diagnosis not present

## 2022-04-25 DIAGNOSIS — C7951 Secondary malignant neoplasm of bone: Secondary | ICD-10-CM | POA: Diagnosis not present

## 2022-04-25 DIAGNOSIS — C779 Secondary and unspecified malignant neoplasm of lymph node, unspecified: Secondary | ICD-10-CM | POA: Diagnosis not present

## 2022-04-25 MED ORDER — DIPHENHYDRAMINE HCL 25 MG PO CAPS: 25.0000 mg | ORAL_CAPSULE | Freq: Once | ORAL | Status: DC

## 2022-04-25 MED ORDER — HEPARIN SOD (PORK) LOCK FLUSH 100 UNIT/ML IV SOLN
500.0000 [IU] | Freq: Once | INTRAVENOUS | Status: AC | PRN
  Administered 2022-04-25: 500 [IU]

## 2022-04-25 MED ORDER — HYDROMORPHONE HCL 2 MG PO TABS: 2.0000 mg | ORAL_TABLET | ORAL | 0 refills | Status: DC | PRN

## 2022-04-25 MED ORDER — SODIUM CHLORIDE 0.9% FLUSH
10.0000 mL | INTRAVENOUS | Status: DC | PRN
  Administered 2022-04-25: 10 mL

## 2022-04-25 MED ORDER — SODIUM CHLORIDE 0.9 % IV SOLN
200.0000 mg | Freq: Once | INTRAVENOUS | Status: AC
  Administered 2022-04-25: 200 mg via INTRAVENOUS
  Filled 2022-04-25: qty 10

## 2022-04-25 MED ORDER — ACETAMINOPHEN 325 MG PO TABS: 650.0000 mg | ORAL_TABLET | Freq: Once | ORAL | Status: DC

## 2022-04-25 MED ORDER — HEPARIN SOD (PORK) LOCK FLUSH 100 UNIT/ML IV SOLN: 500.0000 [IU] | Freq: Once | INTRAVENOUS | Status: DC | PRN

## 2022-04-25 MED ORDER — XTAMPZA ER 13.5 MG PO C12A: 13.5000 mg | EXTENDED_RELEASE_CAPSULE | Freq: Three times a day (TID) | ORAL | 0 refills | Status: DC | PRN

## 2022-04-25 NOTE — Progress Notes (Signed)
Diagnosis: Iron Deficiency Anemia  Provider:   Narda Rutherford IV MD  Procedure: Infusion  IV Type: Port a Cath, IV Location: R Chest  Venofer (Iron Sucrose), Dose: 200 mg  Infusion Start Time: 1156  Infusion Stop Time: 1212  Post Infusion IV Care: Observation period completed and Port a Cath Deaccessed/Flushed  Discharge: Condition: Good, Destination: Home . AVS Provided  Performed by:  Baxter Hire, RN   Patient arrived to infusion clinic with chemo medication infusing via pump through [facility]. After discussion between infusion clinic manager and [facility] PA Dede Query, [place] authorized iron to be infused via second hub on huber needle/PORT access. Once infusion was complete, tubing was flushed generously with 0.9% NaCl. Patient stayed in infusion clinic for observation and for completion of chemotherapy. Port de-accessed and flushed by [employer] from [facility]. Temp noted to be 99.5, up from 97.3 at arrival to clinic. Dede Query PA-C notified. Per PA, ok to discharge patient with education to recheck temperature at home or any other concerns. Patient verbalized understanding and agreement.

## 2022-04-25 NOTE — Progress Notes (Signed)
Joseph Hogan presents to have home infusion pump d/c'd and for port-a-cath deaccess with flush.  Portacath located right chest wall accessed with  H 20 needle.  Good blood return present. Portacath flushed with NS and 500U/82m Heparin, and needle removed intact.  Procedure tolerated well and without incident.

## 2022-04-26 ENCOUNTER — Telehealth: Payer: Self-pay

## 2022-04-26 ENCOUNTER — Other Ambulatory Visit: Payer: Self-pay | Admitting: Hematology and Oncology

## 2022-04-26 ENCOUNTER — Encounter (HOSPITAL_COMMUNITY): Payer: Self-pay | Admitting: Hematology and Oncology

## 2022-04-26 ENCOUNTER — Encounter: Payer: Self-pay | Admitting: Hematology and Oncology

## 2022-04-26 ENCOUNTER — Other Ambulatory Visit: Payer: Self-pay

## 2022-04-26 MED ORDER — HYDROMORPHONE HCL 2 MG PO TABS
2.0000 mg | ORAL_TABLET | ORAL | 0 refills | Status: DC | PRN
  Filled 2022-04-26: qty 90, 15d supply, fill #0

## 2022-04-26 NOTE — Telephone Encounter (Signed)
Notified Patient by voicemail of prior authorization approval for Xtampza 13.5mg  Capsules quantity of 90. Medication is approved through 02/11/2023. No other needs or concerns noted at this time.

## 2022-04-27 ENCOUNTER — Ambulatory Visit: Payer: Medicare HMO

## 2022-04-28 ENCOUNTER — Encounter: Payer: Self-pay | Admitting: Hematology and Oncology

## 2022-04-28 ENCOUNTER — Encounter (HOSPITAL_COMMUNITY): Payer: Self-pay | Admitting: Hematology and Oncology

## 2022-04-28 NOTE — Progress Notes (Signed)
[place] Telephone:([phone]) [phone]   [phone]  PROGRESS NOTE  Patient Care Team: [name], MD as PCP - General (Hematology and Oncology)  CHIEF COMPLAINTS/PURPOSE OF CONSULTATION:  Metastatic rectal carcinoma  ONCOLOGIC HISTORY: 02/03/2022-02/11/2022: Presented to ED due to left hip pain after a mechanical fall.  02/04/2022: CT left [id number] pathologic fracture of the left femoral neck with displacement and angulation, with underlying lytic lesions in the femoral head and neck.Additional lytic destructive lesion of the left parasymphyseal pubic bone extending throughout the superior pubic ramus to the puboacetabular junction, with pathologic fracture of the superior pubic ramus. Adjacent hypoattenuation in the pelvic component of the obturator internus muscle, suspicious for tumor involvement. Probable small lytic lesion in the inferior pubic ramus.Prominent left external iliac lymph nodes measuring up to 1.2 cm. 02/05/2022: CT CAP: Pathologic retroperitoneal and bilateral pelvic adenopathy, with lytic destructive lesions of the left superior and inferior pubic ramus and pubic body as well as a pathologic fracture through the left femoral neck. Pathologic fracture anteriorly in the right second rib. Deformity anteriorly in the right third rib probably from early pathologic fracture. Metastatic disease or myeloma strongly favored over multifocal osteomyelitis as a cause. Mildly enlarged left supraclavicular lymph node and lower thoracic periaortic lymph node. Pathologic retroperitoneal and pelvic adenopathy. 10 cm long segment of rectal wall thickening.1.1 cm exophytic lesion from the left mid kidney posterolaterally, nonspecific for complex cyst versus tumor. Bandlike atelectasis in both lungs.Small type 1 hiatal hernia.Lumbar spondylosis and degenerative disc disease causing generally mild multilevel impingement. Sigmoid colon diverticulosis.Wall thickening in the  proximal stomach body, probably secondary to nondistention. 02/06/2022: Underwent left total hip arthroplasty. Pathology revealed poorly differentiated carcinoma.  SPEP/IFE did not detect monoclonal protein. PSA normal. 02/25/2022: Underwent colonoscopy that showed malignant tumor in the rectum. Pathology confirmed poorly differentiated carcinoma.  04/05/2022: Underwent port placement 04/08/2022: Cycle 1, Day 1 of FOLFOX (held bevacizumab due to recent port placement) 04/23/2022: Cycle 2, Day 1 of FOLFOX plus bevacizumab  HISTORY OF PRESENTING ILLNESS:  Joseph Hogan 62 y.o. male returns for follow-up for metastatic rectal cancer. He presents today to start Cycle 2, Day 1 of FOLFOX. He is accompanied by his daughter for this visit.   On exam today, Mr. Sann reports he tolerated cycle 1 of chemotherapy without any significant limitations.  He reports his energy levels are fairly stable.  He is still trying to slowly improve his mobility but would like to get back into physical therapy.  He reports his appetite is very good.  He denies any nausea, vomiting or abdominal pain.  His bowel habits are unchanged without recurrent episodes of diarrhea or constipation.  He continues to have persistent right shoulder pain.  He currently takes Xtampza 9 mg every 12 hours with minimal relief.  He has not been taking his breakthrough Dilaudid 2 mg as prescribed.  He is planning to be evaluated by radiation oncology for palliative radiation to his right humerus.  He denies easy bruising or signs of active bleeding.  He denies fevers, chills, night sweats, shortness of breath, chest pain, cough, peripheral neuropathy or cold sensitivity.  He has no other complaints.  Rest of the 10 point ROS is below.  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   with abnormal R wave progression noted on EKG   MRSA  (methicillin  resistant Staphylococcus aureus)    Pre-diabetes    Rectal carcinoma ([place]) 03/19/2022    SURGICAL HISTORY: Past Surgical History:  Procedure Laterality Date   COLONOSCOPY     HUMERUS IM NAIL Right 04/12/2022   Procedure: INTRAMEDULLARY (IM) NAIL HUMERAL;  Surgeon: [name], MD;  Location: [facility];  Service: Orthopedics;  Laterality: Right;   IR IMAGING GUIDED PORT INSERTION  04/05/2022   TOTAL HIP ARTHROPLASTY Left 02/06/2022   Procedure: TOTAL HIP ARTHROPLASTY;  Surgeon: [name], MD;  Location: [facility];  Service: Orthopedics;  Laterality: Left;    SOCIAL HISTORY: Social History   Socioeconomic History   Marital status: Divorced    Spouse name: Not on file   Number of children: Not on file   Years of education: Not on file   Highest education level: Not on file  Occupational History   Not on file  Tobacco Use   Smoking status: Never   Smokeless tobacco: Never  Vaping Use   Vaping Use: Never used  Substance and Sexual Activity   Alcohol use: No   Drug use: No   Sexual activity: Not on file  Other Topics Concern   Not on file  Social History Narrative   Not on file   Social Determinants of Health   Financial Resource Strain: Not on file  Food Insecurity: No Food Insecurity (02/04/2022)   Hunger Vital Sign    Worried About Running Out of Food in the Last Year: Never true    Ran Out of Food in the Last Year: Never true  Transportation Needs: No Transportation Needs (02/04/2022)   PRAPARE - Hydrologist (Medical): No    Lack of Transportation (Non-Medical): No  Physical Activity: Not on file  Stress: Not on file  Social Connections: Not on file  Intimate Partner Violence: Not At Risk (02/04/2022)   Humiliation, Afraid, Rape, and Kick questionnaire    Fear of Current or Ex-Partner: No    Emotionally Abused: No    Physically Abused: No    Sexually Abused: No    FAMILY HISTORY: Family History  Problem  Relation Age of Onset   Stroke Mother    Hypertension Mother    Heart disease Father    Alzheimer's disease Father    Hypertension Brother    Alcohol abuse Brother    Cancer Maternal Aunt    Heart disease Maternal Aunt    Cancer Maternal Uncle    Heart disease Maternal Uncle    Cancer Paternal Aunt    Heart disease Paternal Aunt    Cancer Paternal Uncle    Heart disease Paternal Uncle    Heart disease Paternal Grandmother    Alzheimer's disease Paternal Grandfather    Stomach cancer Neg Hx    Rectal cancer Neg Hx    Esophageal cancer Neg Hx    Colon cancer Neg Hx     ALLERGIES:  is allergic to codeine and lisinopril.  MEDICATIONS:  Current Outpatient Medications  Medication Sig Dispense Refill   aspirin (ASPIRIN CHILDRENS) 81 MG chewable tablet Chew 1 tablet (81 mg total) by mouth 2 (two) times daily. For 6 weeks for DVT prophylaxis after surgery 84 tablet 0   b complex vitamins capsule Take 1 capsule by mouth daily.     lidocaine-prilocaine (EMLA) cream Apply 1 Application topically as needed. 30 g 0   methocarbamol (ROBAXIN) 500 MG tablet Take 1 tablet (500 mg total) by mouth every 8 (eight)  hours as needed for muscle spasms. 30 tablet 0   Multiple Vitamin (MULTIVITAMIN WITH MINERALS) TABS tablet Take 1 tablet by mouth daily.     ondansetron (ZOFRAN) 8 MG tablet Take 11.5 tablets (92 mg total) by mouth every 8 (eight) hours as needed for nausea or vomiting. (Patient taking differently: Take 8-16 mg by mouth every 8 (eight) hours as needed for nausea or vomiting.) 90 tablet 0   oxyCODONE ER (XTAMPZA ER) 13.5 MG C12A Take 13.5 mg by mouth every 8 (eight) hours as needed. 90 capsule 0   prochlorperazine (COMPAZINE) 10 MG tablet Take 1 tablet (10 mg total) by mouth every 6 (six) hours as needed for nausea or vomiting. 90 tablet 0   VITAMIN A PO Take 1 tablet by mouth daily.     HYDROmorphone (DILAUDID) 2 MG tablet Take 1 tablet (2 mg total) by mouth every 4 (four) hours as needed  for severe pain. 90 tablet 0   No current facility-administered medications for this visit.    REVIEW OF SYSTEMS:   Constitutional: ( - ) fevers, ( - )  chills , ( - ) night sweats Eyes: ( - ) blurriness of vision, ( - ) double vision, ( - ) watery eyes Ears, nose, mouth, throat, and face: ( - ) mucositis, ( - ) sore throat Respiratory: ( - ) cough, ( - ) dyspnea, ( - ) wheezes Cardiovascular: ( - ) palpitation, ( - ) chest discomfort, ( - ) lower extremity swelling Gastrointestinal:  ( - ) nausea, ( - ) heartburn, ( - ) change in bowel habits Skin: ( - ) abnormal skin rashes Lymphatics: ( - ) new lymphadenopathy, ( - ) easy bruising Neurological: ( - ) numbness, ( - ) tingling, ( - ) new weaknesses Behavioral/Psych: ( - ) mood change, ( - ) new changes  All other systems were reviewed with the patient and are negative.  PHYSICAL EXAMINATION: ECOG PERFORMANCE STATUS: 1 - Symptomatic but completely ambulatory  Vitals:   04/23/22 1203  BP: 139/88  Pulse: (!) 102  Resp: 18  Temp: 98 F (36.7 C)  SpO2: 100%   There were no vitals filed for this visit.   GENERAL: well appearing male in NAD. Exam performed in wheelchair.  SKIN: skin color, texture, turgor are normal, no rashes or significant lesions EYES: conjunctiva are pink and non-injected, sclera clear LUNGS: clear to auscultation and percussion with normal breathing effort HEART: regular rate & rhythm and no murmurs and no lower extremity edema Musculoskeletal: no cyanosis of digits and no clubbing  PSYCH: alert & oriented x 3, fluent speech NEURO: no focal motor/sensory deficits  LABORATORY DATA:  I have reviewed the data as listed    Latest Ref Rng & Units 04/23/2022   11:28 AM 04/12/2022   10:40 AM 04/08/2022   10:11 AM  CBC  WBC 4.0 - 10.5 K/uL 6.7  7.4  8.8   Hemoglobin 13.0 - 17.0 g/dL 9.9  8.9  9.3   Hematocrit 39.0 - 52.0 % 31.9  30.5  29.5   Platelets 150 - 400 K/uL 386  576  644        Latest Ref Rng &  Units 04/23/2022   11:28 AM 04/12/2022   10:40 AM 04/08/2022   10:11 AM  CMP  Glucose 70 - 99 mg/dL 155  168  171   BUN 8 - 23 mg/dL 15  17  11    Creatinine 0.61 - 1.24 mg/dL 0.49  0.59  0.49   Sodium 135 - 145 mmol/L 136  135  134   Potassium 3.5 - 5.1 mmol/L 4.1  4.2  4.3   Chloride 98 - 111 mmol/L 101  100  97   CO2 22 - 32 mmol/L 27  24  28    Calcium 8.9 - 10.3 mg/dL 8.9  8.4  8.6   Total Protein 6.5 - 8.1 g/dL 7.3   7.4   Total Bilirubin 0.3 - 1.2 mg/dL 0.6   0.4   Alkaline Phos 38 - 126 U/L 316   219   AST 15 - 41 U/L 29   23   ALT 0 - 44 U/L 13   28      PATHOLOGY: Rectum, biopsy POORLY DIFFERENTIATED CARCINOMA. SEE NOTE. Diagnosis Note Addendum: Immunohistochemistry shows the carcinoma is positive with MOC-31, cytokeratin 20 and CDX2. Tumor is negative with cytokeratin 7, cytokeratin 5/6, p40, TTF-1, Napsin A, prostate-specific antigen and prostein. The immunophenotype is consistent with primary colorectal adenocarcinoma.  RADIOGRAPHIC STUDIES: I have personally reviewed the radiological images as listed and agreed with the findings in the report. DG Shoulder Right  Result Date: 04/12/2022 CLINICAL DATA:  Humerus fracture. Intraoperative right humerus intramedullary nail. EXAM: RIGHT SHOULDER - 2+ VIEW COMPARISON:  Right shoulder radiographs 04/08/2022 FINDINGS: Images were performed intraoperatively without the presence of a radiologist. Permanent lytic humeral lesion is again demonstrated. The patient is undergoing intramedullary nail fixation of the humeral head through the distal humeral diaphysis. No hardware complication is seen. Total fluoroscopy images: 6 Total fluoroscopy time: 83 seconds Total dose: Radiation Exposure Index (as provided by the fluoroscopic device): 7.2 mGy air Kerma Please see intraoperative findings for further detail. IMPRESSION: Intraoperative images during intramedullary nail fixation of the right humerus. Electronically Signed   By: Yvonne Kendall  M.D.   On: 04/12/2022 14:35   DG C-Arm 1-60 Min-No Report  Result Date: 04/12/2022 Fluoroscopy was utilized by the requesting physician.  No radiographic interpretation.   DG C-Arm 1-60 Min-No Report  Result Date: 04/12/2022 Fluoroscopy was utilized by the requesting physician.  No radiographic interpretation.   DG Shoulder Right  Result Date: 04/08/2022 CLINICAL DATA:  Stage IV bone cancer. Pleuritic pop and opening a door. Large swollen knot area in biceps. Patient reports he has had this pain for weeks. EXAM: RIGHT SHOULDER - 2+ VIEW COMPARISON:  Right shoulder radiographs 12/06/2011 FINDINGS: Moderate glenohumeral joint space narrowing and inferior chronic degenerative osteophytosis. Mild acromioclavicular joint space narrowing and peripheral osteophytosis. Moderate lateral downsloping of the acromion. There are mottled lucencies within the proximal to mid humeral diaphysis along an approximate 9 cm length. There appears to be an oblique linear lucency extending in a superomedial to inferolateral orientation within the distal aspect of this permeative destructive lesion suggesting an acute to subacute fracture. There is mild anterior apex angulation of the fracture line on lateral view. Right chest wall porta catheter is partially visualized. IMPRESSION: Permeative, destructive lesion within the proximal to mid right humeral shaft and associated acute to subacute pathologic fracture without significant displacement but with mild anterior apex angulation. Electronically Signed   By: Yvonne Kendall M.D.   On: 04/08/2022 18:34   IR IMAGING GUIDED PORT INSERTION  Result Date: 04/05/2022 CLINICAL DATA:  Metastatic colorectal carcinoma and need for porta cath for chemotherapy. EXAM: IMPLANTED PORT A CATH PLACEMENT WITH ULTRASOUND AND FLUOROSCOPIC GUIDANCE ANESTHESIA/SEDATION: Moderate (conscious) sedation was employed during this procedure. A total of Versed 4.0 mg and Fentanyl 100 mcg  was administered  intravenously. Moderate Sedation Time: 40 minutes. The patient's level of consciousness and vital signs were monitored continuously by radiology nursing throughout the procedure under my direct supervision. FLUOROSCOPY: 5.0 mGy PROCEDURE: The procedure, risks, benefits, and alternatives were explained to the patient. Questions regarding the procedure were encouraged and answered. The patient understands and consents to the procedure. A time-out was performed prior to initiating the procedure. Ultrasound was utilized to confirm patency of the right internal jugular vein. The right neck and chest were prepped with chlorhexidine in a sterile fashion, and a sterile drape was applied covering the operative field. Maximum barrier sterile technique with sterile gowns and gloves were used for the procedure. Local anesthesia was provided with 1% lidocaine. After creating a small venotomy incision, a 21 gauge needle was advanced into the right internal jugular vein under direct, real-time ultrasound guidance. Ultrasound image documentation was performed. After securing guidewire access, an 8 Fr dilator was placed. A J-wire was kinked to measure appropriate catheter length. A subcutaneous port pocket was then created along the upper chest wall utilizing sharp and blunt dissection. Portable cautery was utilized. The pocket was irrigated with sterile saline. A single lumen power injectable port was chosen for placement. The 8 Fr catheter was tunneled from the port pocket site to the venotomy incision. The port was placed in the pocket. External catheter was trimmed to appropriate length based on guidewire measurement. At the venotomy, an 8 Fr peel-away sheath was placed over a guidewire. The catheter was then placed through the sheath and the sheath removed. Final catheter positioning was confirmed and documented with a fluoroscopic spot image. The port was accessed with a needle and aspirated and flushed with heparinized  saline. The access needle was removed. The venotomy and port pocket incisions were closed with subcutaneous 3-0 Monocryl and subcuticular 4-0 Vicryl. Dermabond was applied to both incisions. COMPLICATIONS: COMPLICATIONS None FINDINGS: After catheter placement, the tip lies at the cavo-atrial junction. The catheter aspirates normally and is ready for immediate use. IMPRESSION: Placement of single lumen port a cath via right internal jugular vein. The catheter tip lies at the cavo-atrial junction. A power injectable port a cath was placed and is ready for immediate use. Electronically Signed   By: Aletta Edouard M.D.   On: 04/05/2022 16:46    ASSESSMENT & PLAN JULLIUS NASTASE is a 62 y.o. male who presents to the clinic for a follow up for rectal carcinoma.   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  --We reviewed that his cancer is incurable and mainstay treatment is chemotherapy. --Recommend chemotherapy regimen FOLFOX plus Bevacizumab q 2 weeks, started on 04/08/2022.  PLAN: --Due to start Cycle 12, Day 1 of FOLFOX plus bevacizumab today --Labs from today reviewed and adequate for treatment. WBC 6.7, Hgb 9.9, MCV 79.8, Plt 386 --Proceed with treatment today without any dose modification --RTC in 2 weeks for labs and follow up before Cycle 3, Day 1.  --Patient requested to transfer to [facility] in the future since patient lives in [place].  #Microcytic anemia: #Thrombocytosis: --Labs today show improvement of anemia with Hgb 9.9, MCV 79.8. --Iron panel confirmed iron deficiency.  --We will arrange for IV venofer 200 mg once a week x 5 doses. S/P first dose on 04/18/2022, due for second dose on 04/25/2022.   #Right shoulder pain: --  Right shoulder xray form 04/08/2022 showed destructive  lesion within the proximal to mid right humerus shaft with acute to subacture pathologic fracture. --Patient underwent pinning of right humerus on 04/12/2022 --Scheduled for radiation oncology consultation on 04/24/2022. --Pain regimen includes Xtampza 9 mg q 12 and Dilaudid 2 mg q 4-6 hours. Due to persistent pain, will increase Xtampza to 13.5 mg every 8-12 hours and conitnue on Dilauded 2 mg for breakthrough pain.   #Right lower extremity neuropathic pain: --Pain well controlled with gabapentin 200 mg TID  #Supportive Care -- chemotherapy education complete -- port placement complete.  -- zofran 8mg  q8H PRN and compazine 10mg  PO q6H for nausea -- EMLA cream for port  No orders of the defined types were placed in this encounter.   All questions were answered. The patient knows to call the clinic with any problems, questions or concerns.  I have spent a total of 30 minutes minutes of face-to-face and non-face-to-face time, preparing to see the patient, performing a medically appropriate examination, counseling and educating the patient, ordering medications/tests/procedures,  documenting clinical information in the electronic health record,  and care coordination.   Dede Query, PA-C Department of Hematology/Oncology [place] [facility] Phone: [phone]

## 2022-05-01 ENCOUNTER — Telehealth: Payer: Self-pay | Admitting: *Deleted

## 2022-05-01 NOTE — Telephone Encounter (Signed)
Called patient to inform of PET Scan for 05-02-22- arrival time- 6:45 am @ [facility], patient to have water only- 6 hrs. prior to test, spoke with patient and he is aware of this scan and the instructions

## 2022-05-02 ENCOUNTER — Encounter (HOSPITAL_COMMUNITY)
Admission: RE | Admit: 2022-05-02 | Discharge: 2022-05-02 | Disposition: A | Discharge status: Home / Self Care | Payer: Medicare HMO | Source: Ambulatory Visit | Attending: Hematology and Oncology | Admitting: Hematology and Oncology

## 2022-05-02 ENCOUNTER — Ambulatory Visit (HOSPITAL_COMMUNITY)
Admission: RE | Admit: 2022-05-02 | Discharge: 2022-05-02 | Disposition: A | Discharge status: Home / Self Care | Payer: Medicare HMO | Source: Ambulatory Visit | Attending: Physician Assistant | Admitting: Physician Assistant

## 2022-05-02 ENCOUNTER — Encounter (HOSPITAL_COMMUNITY): Payer: Self-pay

## 2022-05-02 ENCOUNTER — Ambulatory Visit (HOSPITAL_COMMUNITY)
Admission: RE | Admit: 2022-05-02 | Discharge: 2022-05-02 | Disposition: A | Discharge status: Home / Self Care | Payer: Medicare HMO | Source: Ambulatory Visit | Attending: Radiation Oncology | Admitting: Radiation Oncology

## 2022-05-02 VITALS — BP 149/75 | HR 86 | Temp 97.8°F | Resp 18

## 2022-05-02 DIAGNOSIS — C7951 Secondary malignant neoplasm of bone: Secondary | ICD-10-CM

## 2022-05-02 DIAGNOSIS — M25511 Pain in right shoulder: Secondary | ICD-10-CM | POA: Diagnosis not present

## 2022-05-02 DIAGNOSIS — C778 Secondary and unspecified malignant neoplasm of lymph nodes of multiple regions: Secondary | ICD-10-CM | POA: Diagnosis not present

## 2022-05-02 DIAGNOSIS — C20 Malignant neoplasm of rectum: Secondary | ICD-10-CM | POA: Insufficient documentation

## 2022-05-02 DIAGNOSIS — Z5112 Encounter for antineoplastic immunotherapy: Secondary | ICD-10-CM | POA: Diagnosis not present

## 2022-05-02 DIAGNOSIS — I10 Essential (primary) hypertension: Secondary | ICD-10-CM | POA: Diagnosis not present

## 2022-05-02 DIAGNOSIS — Z5111 Encounter for antineoplastic chemotherapy: Secondary | ICD-10-CM | POA: Diagnosis not present

## 2022-05-02 DIAGNOSIS — C779 Secondary and unspecified malignant neoplasm of lymph node, unspecified: Secondary | ICD-10-CM | POA: Diagnosis not present

## 2022-05-02 DIAGNOSIS — D500 Iron deficiency anemia secondary to blood loss (chronic): Secondary | ICD-10-CM

## 2022-05-02 DIAGNOSIS — D75839 Thrombocytosis, unspecified: Secondary | ICD-10-CM | POA: Diagnosis not present

## 2022-05-02 MED ORDER — ACETAMINOPHEN 325 MG PO TABS: 650.0000 mg | ORAL_TABLET | Freq: Once | ORAL | Status: DC

## 2022-05-02 MED ORDER — DIPHENHYDRAMINE HCL 25 MG PO CAPS: 25.0000 mg | ORAL_CAPSULE | Freq: Once | ORAL | Status: DC

## 2022-05-02 MED ORDER — SODIUM CHLORIDE 0.9% FLUSH: 10.0000 mL | Freq: Once | INTRAVENOUS | Status: DC | PRN

## 2022-05-02 MED ORDER — HEPARIN SOD (PORK) LOCK FLUSH 100 UNIT/ML IV SOLN
INTRAVENOUS | Status: AC
  Administered 2022-05-02: 500 [IU]
  Filled 2022-05-02: qty 5

## 2022-05-02 MED ORDER — HEPARIN SOD (PORK) LOCK FLUSH 100 UNIT/ML IV SOLN: 500.0000 [IU] | Freq: Once | INTRAVENOUS | Status: AC | PRN

## 2022-05-02 MED ORDER — FLUDEOXYGLUCOSE F - 18 (FDG) INJECTION
10.6000 | Freq: Once | INTRAVENOUS | Status: AC | PRN
  Administered 2022-05-02: 10.6 via INTRAVENOUS

## 2022-05-02 MED ORDER — SODIUM CHLORIDE 0.9 % IV SOLN
200.0000 mg | Freq: Once | INTRAVENOUS | Status: AC
  Administered 2022-05-02: 200 mg via INTRAVENOUS
  Filled 2022-05-02: qty 200

## 2022-05-02 NOTE — Addendum Note (Signed)
Encounter addended by: Baxter Hire, RN on: 05/02/2022 11:59 AM  Actions taken: Therapy plan modified

## 2022-05-02 NOTE — Progress Notes (Signed)
Diagnosis: Iron Deficiency Anemia  Provider:   Narda Rutherford IV MD  Procedure: Infusion  IV Type: Port a Cath, IV Location: R Chest  Venofer (Iron Sucrose), Dose: 200 mg  Infusion Start Time: 0937  Infusion Stop Time: 0952  Post Infusion IV Care: Patient declined observation and Port a Cath Deaccessed/Flushed  Discharge: Condition: Good, Destination: Home . AVS Provided  Performed by:  Jonelle Sidle, RN

## 2022-05-03 ENCOUNTER — Ambulatory Visit: Payer: Medicare HMO

## 2022-05-04 ENCOUNTER — Ambulatory Visit: Payer: Medicare HMO

## 2022-05-05 DIAGNOSIS — C7951 Secondary malignant neoplasm of bone: Secondary | ICD-10-CM | POA: Diagnosis not present

## 2022-05-05 DIAGNOSIS — C20 Malignant neoplasm of rectum: Secondary | ICD-10-CM | POA: Diagnosis not present

## 2022-05-06 ENCOUNTER — Ambulatory Visit
Admission: RE | Admit: 2022-05-06 | Discharge: 2022-05-06 | Disposition: A | Discharge status: Home / Self Care | Payer: Medicare HMO | Source: Ambulatory Visit | Attending: Radiation Oncology | Admitting: Radiation Oncology

## 2022-05-06 ENCOUNTER — Ambulatory Visit: Payer: Medicare HMO

## 2022-05-06 ENCOUNTER — Other Ambulatory Visit: Payer: Self-pay

## 2022-05-06 ENCOUNTER — Other Ambulatory Visit: Payer: Medicare HMO

## 2022-05-06 DIAGNOSIS — C7951 Secondary malignant neoplasm of bone: Secondary | ICD-10-CM | POA: Diagnosis not present

## 2022-05-06 DIAGNOSIS — C20 Malignant neoplasm of rectum: Secondary | ICD-10-CM | POA: Insufficient documentation

## 2022-05-06 DIAGNOSIS — Z510 Encounter for antineoplastic radiation therapy: Secondary | ICD-10-CM | POA: Diagnosis not present

## 2022-05-06 NOTE — Progress Notes (Unsigned)
[place] [phone]   [phone]  PROGRESS NOTE  Patient Care Team: [name], MD as PCP - General (Hematology and Oncology)  CHIEF COMPLAINTS/PURPOSE OF CONSULTATION:  Metastatic rectal carcinoma  ONCOLOGIC HISTORY: 02/03/2022-02/11/2022: Presented to ED due to left hip pain after a mechanical fall.  02/04/2022: CT left [id number] pathologic fracture of the left femoral neck with displacement and angulation, with underlying lytic lesions in the femoral head and neck.Additional lytic destructive lesion of the left parasymphyseal pubic bone extending throughout the superior pubic ramus to the puboacetabular junction, with pathologic fracture of the superior pubic ramus. Adjacent hypoattenuation in the pelvic component of the obturator internus muscle, suspicious for tumor involvement. Probable small lytic lesion in the inferior pubic ramus.Prominent left external iliac lymph nodes measuring up to 1.2 cm. 02/05/2022: CT CAP: Pathologic retroperitoneal and bilateral pelvic adenopathy, with lytic destructive lesions of the left superior and inferior pubic ramus and pubic body as well as a pathologic fracture through the left femoral neck. Pathologic fracture anteriorly in the right second rib. Deformity anteriorly in the right third rib probably from early pathologic fracture. Metastatic disease or myeloma strongly favored over multifocal osteomyelitis as a cause. Mildly enlarged left supraclavicular lymph node and lower thoracic periaortic lymph node. Pathologic retroperitoneal and pelvic adenopathy. 10 cm long segment of rectal wall thickening.1.1 cm exophytic lesion from the left mid kidney posterolaterally, nonspecific for complex cyst versus tumor. Bandlike atelectasis in both lungs.Small type 1 hiatal hernia.Lumbar spondylosis and degenerative disc disease causing generally mild multilevel impingement. Sigmoid colon diverticulosis.Wall thickening in the  proximal stomach body, probably secondary to nondistention. 02/06/2022: Underwent left total hip arthroplasty. Pathology revealed poorly differentiated carcinoma.  SPEP/IFE did not detect monoclonal protein. PSA normal. 02/25/2022: Underwent colonoscopy that showed malignant tumor in the rectum. Pathology confirmed poorly differentiated carcinoma.  04/05/2022: Underwent port placement 04/08/2022: Cycle 1, Day 1 of FOLFOX (held bevacizumab due to recent port placement) 04/23/2022: Cycle 2, Day 1 of FOLFOX plus bevacizumab 05/07/2022: Cycle 3, Day 1 of FOLFOX plus bevacizumab  HISTORY OF PRESENTING ILLNESS:  Joseph Hogan 62 y.o. male returns for follow-up for metastatic rectal cancer. He presents today to start Cycle 3, Day 1 of FOLFOX. He is accompanied by his daughter for this visit.   On exam today, Joseph Hogan is accompanied by his daughter infusion.  He reports that he has been well overall interim since our last visit.  He reports for cycle 2 his appetite remained good and his only major symptom is cold sensitivity.  He reports he has to avoid cold objects and drinks for the first day.  It caused some tingling but no major symptoms.  He reports he is not having any trouble with nausea, vomiting, or diarrhea.  His bowels are solid and moving regularly.  He reports that he did lose a little bit of weight in the interim since our last visit.  Overall his pain is under good control though his shoulder is still causing some trouble.  He reports he has not yet started his radiation therapy.  He reports that his Xtampza dose was increased but he is unsure why.  He notes he does have some improvement in the functionality of his right hand and is able to write more and do other day-to-day functions.  Overall he feels well and is willing and able to proceed with chemotherapy treatment at this time.Marland Kitchen  He denies fevers, chills, night sweats, shortness of breath, chest pain, cough,  peripheral neuropathy or cold  sensitivity.  He has no other complaints.  Rest of the 10 point ROS is below.  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   with abnormal R wave progression noted on EKG   MRSA (methicillin resistant Staphylococcus aureus)    Pre-diabetes    Rectal carcinoma ([place]) 03/19/2022    SURGICAL HISTORY: Past Surgical History:  Procedure Laterality Date   COLONOSCOPY     HUMERUS IM NAIL Right 04/12/2022   Procedure: INTRAMEDULLARY (IM) NAIL HUMERAL;  Surgeon: [name], MD;  Location: [facility];  Service: Orthopedics;  Laterality: Right;   IR IMAGING GUIDED PORT INSERTION  04/05/2022   TOTAL HIP ARTHROPLASTY Left 02/06/2022   Procedure: TOTAL HIP ARTHROPLASTY;  Surgeon: [name], MD;  Location: [facility];  Service: Orthopedics;  Laterality: Left;    SOCIAL HISTORY: Social History   Socioeconomic History   Marital status: Divorced    Spouse name: Not on file   Number of children: Not on file   Years of education: Not on file   Highest education level: Not on file  Occupational History   Not on file  Tobacco Use   Smoking status: Never   Smokeless tobacco: Never  Vaping Use   Vaping Use: Never used  Substance and Sexual Activity   Alcohol use: No   Drug use: No   Sexual activity: Not on file  Other Topics Concern   Not on file  Social History Narrative   Not on file   Social Determinants of Health   Financial Resource Strain: Not on file  Food Insecurity: No Food Insecurity (02/04/2022)   Hunger Vital Sign    Worried About Running Out of Food in the Last Year: Never true    Ran Out of Food in the Last Year: Never true  Transportation Needs: No Transportation Needs (02/04/2022)   PRAPARE - Hydrologist (Medical): No    Lack of Transportation (Non-Medical): No  Physical Activity: Not on file  Stress: Not on file  Social  Connections: Not on file  Intimate Partner Violence: Not At Risk (02/04/2022)   Humiliation, Afraid, Rape, and Kick questionnaire    Fear of Current or Ex-Partner: No    Emotionally Abused: No    Physically Abused: No    Sexually Abused: No    FAMILY HISTORY: Family History  Problem Relation Age of Onset   Stroke Mother    Hypertension Mother    Heart disease Father    Alzheimer's disease Father    Hypertension Brother    Alcohol abuse Brother    Cancer Maternal Aunt    Heart disease Maternal Aunt    Cancer Maternal Uncle    Heart disease Maternal Uncle    Cancer Paternal Aunt    Heart disease Paternal Aunt    Cancer Paternal Uncle    Heart disease Paternal Uncle    Heart disease Paternal Grandmother    Alzheimer's disease Paternal Grandfather    Stomach cancer Neg Hx    Rectal cancer Neg Hx    Esophageal cancer Neg Hx    Colon cancer Neg Hx     ALLERGIES:  is allergic to codeine and lisinopril.  MEDICATIONS:  Current Outpatient Medications  Medication Sig Dispense Refill   aspirin (ASPIRIN CHILDRENS) 81 MG chewable tablet Chew 1 tablet (  81 mg total) by mouth 2 (two) times daily. For 6 weeks for DVT prophylaxis after surgery 84 tablet 0   b complex vitamins capsule Take 1 capsule by mouth daily.     HYDROmorphone (DILAUDID) 2 MG tablet Take 1 tablet (2 mg total) by mouth every 4 (four) hours as needed for severe pain. 90 tablet 0   lidocaine-prilocaine (EMLA) cream Apply 1 Application topically as needed. 30 g 0   methocarbamol (ROBAXIN) 500 MG tablet Take 1 tablet (500 mg total) by mouth every 8 (eight) hours as needed for muscle spasms. 30 tablet 0   Multiple Vitamin (MULTIVITAMIN WITH MINERALS) TABS tablet Take 1 tablet by mouth daily.     ondansetron (ZOFRAN) 8 MG tablet Take 11.5 tablets (92 mg total) by mouth every 8 (eight) hours as needed for nausea or vomiting. (Patient taking differently: Take 8-16 mg by mouth every 8 (eight) hours as needed for nausea or  vomiting.) 90 tablet 0   oxyCODONE ER (XTAMPZA ER) 13.5 MG C12A Take 13.5 mg by mouth every 8 (eight) hours as needed. 90 capsule 0   prochlorperazine (COMPAZINE) 10 MG tablet Take 1 tablet (10 mg total) by mouth every 6 (six) hours as needed for nausea or vomiting. 90 tablet 0   VITAMIN A PO Take 1 tablet by mouth daily.     No current facility-administered medications for this visit.   Facility-Administered Medications Ordered in Other Visits  Medication Dose Route Frequency Provider Last Rate Last Admin   fluorouracil (ADRUCIL) 5,000 mg in sodium chloride 0.9 % 150 mL chemo infusion  2,400 mg/m2 (Treatment Plan Recorded) Intravenous 1 day or 1 dose [name], MD   Infusion Verify at 05/07/22 1255    REVIEW OF SYSTEMS:   Constitutional: ( - ) fevers, ( - )  chills , ( - ) night sweats Eyes: ( - ) blurriness of vision, ( - ) double vision, ( - ) watery eyes Ears, nose, mouth, throat, and face: ( - ) mucositis, ( - ) sore throat Respiratory: ( - ) cough, ( - ) dyspnea, ( - ) wheezes Cardiovascular: ( - ) palpitation, ( - ) chest discomfort, ( - ) lower extremity swelling Gastrointestinal:  ( - ) nausea, ( - ) heartburn, ( - ) change in bowel habits Skin: ( - ) abnormal skin rashes Lymphatics: ( - ) new lymphadenopathy, ( - ) easy bruising Neurological: ( - ) numbness, ( - ) tingling, ( - ) new weaknesses Behavioral/Psych: ( - ) mood change, ( - ) new changes  All other systems were reviewed with the patient and are negative.  PHYSICAL EXAMINATION: ECOG PERFORMANCE STATUS: 1 - Symptomatic but completely ambulatory  There were no vitals filed for this visit.  There were no vitals filed for this visit.   GENERAL: well appearing male in NAD. Exam performed in wheelchair.  SKIN: skin color, texture, turgor are normal, no rashes or significant lesions EYES: conjunctiva are pink and non-injected, sclera clear LUNGS: clear to auscultation and percussion with normal breathing  effort HEART: regular rate & rhythm and no murmurs and no lower extremity edema Musculoskeletal: no cyanosis of digits and no clubbing  PSYCH: alert & oriented x 3, fluent speech NEURO: no focal motor/sensory deficits  LABORATORY DATA:  I have reviewed the data as listed    Latest Ref Rng & Units 05/07/2022    7:59 AM 04/23/2022   11:28 AM 04/12/2022   10:40 AM  CBC  WBC 4.0 - 10.5 K/uL 6.5  6.7  7.4   Hemoglobin 13.0 - 17.0 g/dL 10.4  9.9  8.9   Hematocrit 39.0 - 52.0 % 34.0  31.9  30.5   Platelets 150 - 400 K/uL 260  386  576        Latest Ref Rng & Units 05/07/2022    7:59 AM 04/23/2022   11:28 AM 04/12/2022   10:40 AM  CMP  Glucose 70 - 99 mg/dL 148  155  168   BUN 8 - 23 mg/dL 16  15  17    Creatinine 0.61 - 1.24 mg/dL 0.51  0.49  0.59   Sodium 135 - 145 mmol/L 137  136  135   Potassium 3.5 - 5.1 mmol/L 4.3  4.1  4.2   Chloride 98 - 111 mmol/L 102  101  100   CO2 22 - 32 mmol/L 26  27  24    Calcium 8.9 - 10.3 mg/dL 9.2  8.9  8.4   Total Protein 6.5 - 8.1 g/dL 7.3  7.3    Total Bilirubin 0.3 - 1.2 mg/dL 0.6  0.6    Alkaline Phos 38 - 126 U/L 237  316    AST 15 - 41 U/L 22  29    ALT 0 - 44 U/L 11  13       PATHOLOGY: Rectum, biopsy POORLY DIFFERENTIATED CARCINOMA. SEE NOTE. Diagnosis Note Addendum: Immunohistochemistry shows the carcinoma is positive with MOC-31, cytokeratin 20 and CDX2. Tumor is negative with cytokeratin 7, cytokeratin 5/6, p40, TTF-1, Napsin A, prostate-specific antigen and prostein. The immunophenotype is consistent with primary colorectal adenocarcinoma.  RADIOGRAPHIC STUDIES: I have personally reviewed the radiological images as listed and agreed with the findings in the report. NM PET Image Initial (PI) Skull Base To Thigh  Result Date: 05/02/2022 CLINICAL DATA:  Treatment strategy for rectal cancer staging. Multiple osseous metastases and fractures. EXAM: NUCLEAR MEDICINE PET SKULL BASE TO THIGH TECHNIQUE: 10.6 mCi F-18 FDG was injected  intravenously. Full-ring PET imaging was performed from the skull base to thigh after the radiotracer. CT data was obtained and used for attenuation correction and anatomic localization. Fasting blood glucose: 147 mg/dl COMPARISON:  CTs of the chest, abdomen and pelvis 02/05/2022. FINDINGS: Mediastinal blood pool activity: SUV max 2.2 NECK: No hypermetabolic cervical lymph nodes are identified. No suspicious activity identified within the pharyngeal mucosal space. Incidental CT findings: Bilateral carotid atherosclerosis. CHEST: Hypermetabolic left supraclavicular lymph node measuring 7 mm on image 73/3 (SUV max 3.9). No other hypermetabolic mediastinal, hilar or axillary lymph nodes. No hypermetabolic pulmonary activity or suspicious nodularity. Incidental CT findings: Similar mild chronic lung disease with scattered linear scarring or atelectasis. No suspicious pulmonary nodules. Right IJ Port-A-Cath extends to the upper right atrium. Mild aortic and great vessel atherosclerosis. ABDOMEN/PELVIS: There is no hypermetabolic activity within the liver, adrenal glands, spleen or pancreas. As seen on prior CT, there are multiple mildly enlarged retroperitoneal, pelvic and inguinal lymph nodes which demonstrate mildly increased metabolic activity. 1.2 cm aortocaval node on image 174/3 has an SUV max of 5.0. There are external iliac nodes within SUV max of 3.8 on the right and 4.7 on the left. Several small mildly hypermetabolic left inguinal lymph nodes are also present (SUV max 4.9). Low level metabolic activity is seen throughout the presacral and pelvic soft tissue stranding noted previously, nonspecific and possibly due to tumor extension or inflammation. There is a small focus of hypermetabolic activity along the right aspect of  the rectum (SUV max 6.6). There is also focal hypermetabolic activity within the mid descending colon (SUV max 5.9). This is associated with wall thickening, diverticula and soft tissue  stranding on the CT images, suspicious for diverticulitis. No ascites or generalized hypermetabolic peritoneal activity. Incidental CT findings: Moderate stool throughout the colon. Right renal cystic lesion showed no metabolic activity and are likely benign; no follow-up imaging recommended. SKELETON: Widespread hypermetabolic osseous metastatic disease. In the spine, there is involvement of the right lateral mass of C1, the L2 spinous process and the upper sacrum. Lesions are also present in both proximal humeri, around the right sacroiliac joint, multiple ribs, throughout the bony pelvis and in the proximal left femur. Interval surgical ORIF of the proximal right humerus in the left femoral neck for pathologic fractures. Large expansile lesion involving the left pubic rami measures up to 7.9 x 4.9 cm on image 256/3 and is associated with moderate heterogeneous hypermetabolic activity (SUV max 8.7). Incidental CT findings: none IMPRESSION: 1. Widespread hypermetabolic osseous metastatic disease as described. 2. Mildly hypermetabolic retroperitoneal, pelvic and inguinal lymph nodes suspicious for nodal metastases. A single small hypermetabolic left supraclavicular lymph node is also suspicious. 3. No evidence of solid organ or pulmonary metastases. 4. Probable diverticulitis involving the mid descending colon. There is nonspecific low level metabolic activity in the presacral and pelvic soft tissues which could be inflammatory or neoplastic. 5.  Aortic Atherosclerosis (ICD10-I70.0). Electronically Signed   By: Richardean Sale M.D.   On: 05/02/2022 14:45   DG Shoulder Right  Result Date: 04/12/2022 CLINICAL DATA:  Humerus fracture. Intraoperative right humerus intramedullary nail. EXAM: RIGHT SHOULDER - 2+ VIEW COMPARISON:  Right shoulder radiographs 04/08/2022 FINDINGS: Images were performed intraoperatively without the presence of a radiologist. Permanent lytic humeral lesion is again demonstrated. The patient  is undergoing intramedullary nail fixation of the humeral head through the distal humeral diaphysis. No hardware complication is seen. Total fluoroscopy images: 6 Total fluoroscopy time: 83 seconds Total dose: Radiation Exposure Index (as provided by the fluoroscopic device): 7.2 mGy air Kerma Please see intraoperative findings for further detail. IMPRESSION: Intraoperative images during intramedullary nail fixation of the right humerus. Electronically Signed   By: Yvonne Kendall M.D.   On: 04/12/2022 14:35   DG C-Arm 1-60 Min-No Report  Result Date: 04/12/2022 Fluoroscopy was utilized by the requesting physician.  No radiographic interpretation.   DG C-Arm 1-60 Min-No Report  Result Date: 04/12/2022 Fluoroscopy was utilized by the requesting physician.  No radiographic interpretation.   DG Shoulder Right  Result Date: 04/08/2022 CLINICAL DATA:  Stage IV bone cancer. Pleuritic pop and opening a door. Large swollen knot area in biceps. Patient reports he has had this pain for weeks. EXAM: RIGHT SHOULDER - 2+ VIEW COMPARISON:  Right shoulder radiographs 12/06/2011 FINDINGS: Moderate glenohumeral joint space narrowing and inferior chronic degenerative osteophytosis. Mild acromioclavicular joint space narrowing and peripheral osteophytosis. Moderate lateral downsloping of the acromion. There are mottled lucencies within the proximal to mid humeral diaphysis along an approximate 9 cm length. There appears to be an oblique linear lucency extending in a superomedial to inferolateral orientation within the distal aspect of this permeative destructive lesion suggesting an acute to subacute fracture. There is mild anterior apex angulation of the fracture line on lateral view. Right chest wall porta catheter is partially visualized. IMPRESSION: Permeative, destructive lesion within the proximal to mid right humeral shaft and associated acute to subacute pathologic fracture without significant displacement but with  mild anterior apex  angulation. Electronically Signed   By: Yvonne Kendall M.D.   On: 04/08/2022 18:34    ASSESSMENT & PLAN Joseph Hogan is a 62 y.o. male who presents to the clinic for a follow up for rectal carcinoma.   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  --We reviewed that his cancer is incurable and mainstay treatment is chemotherapy. --Recommend chemotherapy regimen FOLFOX plus Bevacizumab q 2 weeks, started on 04/08/2022.  PLAN: --Due to start Cycle 3, Day 1 of FOLFOX plus bevacizumab today --Labs from today reviewed and adequate for treatment. WBC 6.5, hemoglobin 10.4, MCV 81.5, and platelets of 260 --Proceed with treatment today without any dose modification --RTC in 2 weeks for labs and follow up before Cycle 4, Day 1.  --Patient requested to transfer to [facility] in the future since patient lives in [place].  #Microcytic anemia: #Thrombocytosis: --Labs today show improvement of anemia with Hgb 10.4 --Iron panel confirmed iron deficiency.  --We will arrange for IV venofer 200 mg once a week x 5 doses. S/P first dose on 04/18/2022, due for second dose on 04/25/2022.   #Right shoulder pain: --Right shoulder xray form 04/08/2022 showed destructive lesion within the proximal to mid right humerus shaft with acute to subacture pathologic fracture. --Patient underwent pinning of right humerus on 04/12/2022 --Scheduled for radiation oncology consultation on 04/24/2022. --continue Xtampza 13.5 mg q 12 and Dilaudid 2 mg q 4-6 hours for breakthrough pain  #Right lower extremity neuropathic pain: --Pain well controlled with gabapentin 200 mg TID  #Supportive Care -- chemotherapy education complete -- port placement complete.  -- zofran 8mg  q8H PRN and compazine 10mg  PO q6H  for nausea -- EMLA cream for port  No orders of the defined types were placed in this encounter.   All questions were answered. The patient knows to call the clinic with any problems, questions or concerns.  I have spent a total of 30 minutes minutes of face-to-face and non-face-to-face time, preparing to see the patient, performing a medically appropriate examination, counseling and educating the patient, ordering medications/tests/procedures,  documenting clinical information in the electronic health record,  and care coordination.   Ledell Peoples, MD Department of Hematology/Oncology [place] [facility] Phone: [phone] Pager: [phone] Email: [email or web address]

## 2022-05-07 ENCOUNTER — Encounter: Payer: Self-pay | Admitting: Hematology and Oncology

## 2022-05-07 ENCOUNTER — Inpatient Hospital Stay: Payer: Medicare HMO

## 2022-05-07 ENCOUNTER — Inpatient Hospital Stay: Payer: Medicare HMO | Admitting: Hematology and Oncology

## 2022-05-07 ENCOUNTER — Encounter (HOSPITAL_COMMUNITY): Payer: Self-pay | Admitting: Hematology and Oncology

## 2022-05-07 VITALS — BP 157/88 | HR 83 | Temp 98.0°F | Resp 19 | Wt 205.0 lb

## 2022-05-07 DIAGNOSIS — C20 Malignant neoplasm of rectum: Secondary | ICD-10-CM

## 2022-05-07 DIAGNOSIS — Z5111 Encounter for antineoplastic chemotherapy: Secondary | ICD-10-CM | POA: Diagnosis not present

## 2022-05-07 DIAGNOSIS — Z5112 Encounter for antineoplastic immunotherapy: Secondary | ICD-10-CM | POA: Diagnosis not present

## 2022-05-07 DIAGNOSIS — D75839 Thrombocytosis, unspecified: Secondary | ICD-10-CM | POA: Diagnosis not present

## 2022-05-07 DIAGNOSIS — C7951 Secondary malignant neoplasm of bone: Secondary | ICD-10-CM | POA: Diagnosis not present

## 2022-05-07 DIAGNOSIS — M25511 Pain in right shoulder: Secondary | ICD-10-CM | POA: Diagnosis not present

## 2022-05-07 DIAGNOSIS — C779 Secondary and unspecified malignant neoplasm of lymph node, unspecified: Secondary | ICD-10-CM | POA: Diagnosis not present

## 2022-05-07 DIAGNOSIS — Z95828 Presence of other vascular implants and grafts: Secondary | ICD-10-CM

## 2022-05-07 DIAGNOSIS — Z510 Encounter for antineoplastic radiation therapy: Secondary | ICD-10-CM | POA: Diagnosis not present

## 2022-05-07 DIAGNOSIS — I10 Essential (primary) hypertension: Secondary | ICD-10-CM | POA: Diagnosis not present

## 2022-05-07 DIAGNOSIS — D500 Iron deficiency anemia secondary to blood loss (chronic): Secondary | ICD-10-CM | POA: Diagnosis not present

## 2022-05-07 LAB — CBC WITH DIFFERENTIAL (CANCER CENTER ONLY)
Abs Immature Granulocytes: 0.02 10*3/uL (ref 0.00–0.07)
Basophils Absolute: 0 10*3/uL (ref 0.0–0.1)
Basophils Relative: 1 %
Eosinophils Absolute: 0.2 10*3/uL (ref 0.0–0.5)
Eosinophils Relative: 3 %
HCT: 34 % — ABNORMAL LOW (ref 39.0–52.0)
Hemoglobin: 10.4 g/dL — ABNORMAL LOW (ref 13.0–17.0)
Immature Granulocytes: 0 %
Lymphocytes Relative: 15 %
Lymphs Abs: 1 10*3/uL (ref 0.7–4.0)
MCH: 24.9 pg — ABNORMAL LOW (ref 26.0–34.0)
MCHC: 30.6 g/dL (ref 30.0–36.0)
MCV: 81.5 fL (ref 80.0–100.0)
Monocytes Absolute: 0.7 10*3/uL (ref 0.1–1.0)
Monocytes Relative: 11 %
Neutro Abs: 4.6 10*3/uL (ref 1.7–7.7)
Neutrophils Relative %: 70 %
Platelet Count: 260 10*3/uL (ref 150–400)
RBC: 4.17 MIL/uL — ABNORMAL LOW (ref 4.22–5.81)
RDW: 20.5 % — ABNORMAL HIGH (ref 11.5–15.5)
WBC Count: 6.5 10*3/uL (ref 4.0–10.5)
nRBC: 0 % (ref 0.0–0.2)

## 2022-05-07 LAB — CMP (CANCER CENTER ONLY)
ALT: 11 U/L (ref 0–44)
AST: 22 U/L (ref 15–41)
Albumin: 3.7 g/dL (ref 3.5–5.0)
Alkaline Phosphatase: 237 U/L — ABNORMAL HIGH (ref 38–126)
Anion gap: 9 (ref 5–15)
BUN: 16 mg/dL (ref 8–23)
CO2: 26 mmol/L (ref 22–32)
Calcium: 9.2 mg/dL (ref 8.9–10.3)
Chloride: 102 mmol/L (ref 98–111)
Creatinine: 0.51 mg/dL — ABNORMAL LOW (ref 0.61–1.24)
GFR, Estimated: >60 mL/min (ref >60)
Glucose, Bld: 148 mg/dL — ABNORMAL HIGH (ref 70–99)
Potassium: 4.3 mmol/L (ref 3.5–5.1)
Sodium: 137 mmol/L (ref 135–145)
Total Bilirubin: 0.6 mg/dL (ref 0.3–1.2)
Total Protein: 7.3 g/dL (ref 6.5–8.1)

## 2022-05-07 LAB — TOTAL PROTEIN, URINE DIPSTICK: Protein, ur: 30 mg/dL — AB

## 2022-05-07 MED ORDER — FLUOROURACIL CHEMO INJECTION 2.5 GM/50ML
400.0000 mg/m2 | Freq: Once | INTRAVENOUS | Status: AC
  Administered 2022-05-07: 900 mg via INTRAVENOUS
  Filled 2022-05-07: qty 18

## 2022-05-07 MED ORDER — DEXTROSE 5 % IV SOLN: Freq: Once | INTRAVENOUS | Status: AC

## 2022-05-07 MED ORDER — SODIUM CHLORIDE 0.9 % IV SOLN: Freq: Once | INTRAVENOUS | Status: AC

## 2022-05-07 MED ORDER — SODIUM CHLORIDE 0.9 % IV SOLN
2400.0000 mg/m2 | INTRAVENOUS | Status: DC
  Administered 2022-05-07: 5000 mg via INTRAVENOUS
  Filled 2022-05-07: qty 100

## 2022-05-07 MED ORDER — PALONOSETRON HCL INJECTION 0.25 MG/5ML
0.2500 mg | Freq: Once | INTRAVENOUS | Status: AC
  Administered 2022-05-07: 0.25 mg via INTRAVENOUS
  Filled 2022-05-07: qty 5

## 2022-05-07 MED ORDER — OXALIPLATIN CHEMO INJECTION 100 MG/20ML
85.0000 mg/m2 | Freq: Once | INTRAVENOUS | Status: AC
  Administered 2022-05-07: 200 mg via INTRAVENOUS
  Filled 2022-05-07: qty 40

## 2022-05-07 MED ORDER — LEUCOVORIN CALCIUM INJECTION 350 MG
400.0000 mg/m2 | Freq: Once | INTRAVENOUS | Status: AC
  Administered 2022-05-07: 876 mg via INTRAVENOUS
  Filled 2022-05-07: qty 43.8

## 2022-05-07 MED ORDER — SODIUM CHLORIDE 0.9 % IV SOLN
10.0000 mg | Freq: Once | INTRAVENOUS | Status: AC
  Administered 2022-05-07: 10 mg via INTRAVENOUS
  Filled 2022-05-07: qty 10

## 2022-05-07 MED ORDER — SODIUM CHLORIDE 0.9 % IV SOLN
5.0000 mg/kg | Freq: Once | INTRAVENOUS | Status: AC
  Administered 2022-05-07: 500 mg via INTRAVENOUS
  Filled 2022-05-07: qty 4

## 2022-05-07 MED ORDER — SODIUM CHLORIDE 0.9% FLUSH
10.0000 mL | Freq: Once | INTRAVENOUS | Status: AC
  Administered 2022-05-07: 10 mL

## 2022-05-07 NOTE — Patient Instructions (Signed)
Joseph Hogan  Discharge Instructions: Thank you for choosing [facility] to provide your oncology and hematology care.   If you have a lab appointment with the [facility], please go directly to the [facility] and check in at the registration area.   Wear comfortable clothing and clothing appropriate for easy access to any Portacath or PICC line.   We strive to give you quality time with your provider. You may need to reschedule your appointment if you arrive late (15 or more minutes).  Arriving late affects you and other patients whose appointments are after yours.  Also, if you miss three or more appointments without notifying the office, you may be dismissed from the clinic at the provider's discretion.      For prescription refill requests, have your pharmacy contact our office and allow 72 hours for refills to be completed.    Today you received the following chemotherapy and/or immunotherapy agents: Bevacizumab, Oxaliplatin, and Fluorouracil       To help prevent nausea and vomiting after your treatment, we encourage you to take your nausea medication as directed.  BELOW ARE SYMPTOMS THAT SHOULD BE REPORTED IMMEDIATELY: *FEVER GREATER THAN 100.4 F (38 C) OR HIGHER *CHILLS OR SWEATING *NAUSEA AND VOMITING THAT IS NOT CONTROLLED WITH YOUR NAUSEA MEDICATION *UNUSUAL SHORTNESS OF BREATH *UNUSUAL BRUISING OR BLEEDING *URINARY PROBLEMS (pain or burning when urinating, or frequent urination) *BOWEL PROBLEMS (unusual diarrhea, constipation, pain near the anus) TENDERNESS IN MOUTH AND THROAT WITH OR WITHOUT PRESENCE OF ULCERS (sore throat, sores in mouth, or a toothache) UNUSUAL RASH, SWELLING OR PAIN  UNUSUAL VAGINAL DISCHARGE OR ITCHING   Items with * indicate a potential emergency and should be followed up as soon as possible or go to the Emergency Department if any problems should occur.  Please show the CHEMOTHERAPY ALERT CARD or  IMMUNOTHERAPY ALERT CARD at check-in to the Emergency Department and triage nurse.  Should you have questions after your visit or need to cancel or reschedule your appointment, please contact [facility]  Dept: [phone]  and follow the prompts.  Office hours are 8:00 a.m. to 4:30 p.m. Monday - Friday. Please note that voicemails left after 4:00 p.m. may not be returned until the following business day.  We are closed weekends and major holidays. You have access to a nurse at all times for urgent questions. Please call the main number to the clinic Dept: [phone] and follow the prompts.   For any non-urgent questions, you may also contact your provider using MyChart. We now offer e-Visits for anyone 87 and older to request care online for non-urgent symptoms. For details visit [email or web address].   Also download the MyChart app! Go to the app store, search "MyChart", open the app, select [facility], and log in with your MyChart username and password.

## 2022-05-09 ENCOUNTER — Ambulatory Visit: Payer: Medicare HMO | Admitting: Radiation Oncology

## 2022-05-09 ENCOUNTER — Encounter (HOSPITAL_COMMUNITY)
Admission: RE | Admit: 2022-05-09 | Discharge: 2022-05-09 | Disposition: A | Discharge status: Home / Self Care | Payer: Medicare HMO | Source: Ambulatory Visit | Attending: Hematology and Oncology | Admitting: Hematology and Oncology

## 2022-05-09 ENCOUNTER — Inpatient Hospital Stay: Payer: Medicare HMO

## 2022-05-09 ENCOUNTER — Inpatient Hospital Stay (HOSPITAL_COMMUNITY): Admission: RE | Admit: 2022-05-09 | Payer: Medicare HMO | Source: Ambulatory Visit

## 2022-05-09 VITALS — BP 145/79 | HR 89 | Temp 97.9°F | Resp 17

## 2022-05-09 DIAGNOSIS — D500 Iron deficiency anemia secondary to blood loss (chronic): Secondary | ICD-10-CM | POA: Diagnosis not present

## 2022-05-09 DIAGNOSIS — C20 Malignant neoplasm of rectum: Secondary | ICD-10-CM

## 2022-05-09 DIAGNOSIS — I10 Essential (primary) hypertension: Secondary | ICD-10-CM | POA: Diagnosis not present

## 2022-05-09 DIAGNOSIS — Z5111 Encounter for antineoplastic chemotherapy: Secondary | ICD-10-CM | POA: Diagnosis not present

## 2022-05-09 DIAGNOSIS — C7951 Secondary malignant neoplasm of bone: Secondary | ICD-10-CM | POA: Diagnosis not present

## 2022-05-09 DIAGNOSIS — Z5112 Encounter for antineoplastic immunotherapy: Secondary | ICD-10-CM | POA: Diagnosis not present

## 2022-05-09 DIAGNOSIS — D75839 Thrombocytosis, unspecified: Secondary | ICD-10-CM | POA: Diagnosis not present

## 2022-05-09 DIAGNOSIS — C779 Secondary and unspecified malignant neoplasm of lymph node, unspecified: Secondary | ICD-10-CM | POA: Diagnosis not present

## 2022-05-09 DIAGNOSIS — M25511 Pain in right shoulder: Secondary | ICD-10-CM | POA: Diagnosis not present

## 2022-05-09 MED ORDER — SODIUM CHLORIDE 0.9% FLUSH
10.0000 mL | INTRAVENOUS | Status: DC | PRN
  Administered 2022-05-09: 10 mL

## 2022-05-09 MED ORDER — HEPARIN SOD (PORK) LOCK FLUSH 100 UNIT/ML IV SOLN
500.0000 [IU] | Freq: Once | INTRAVENOUS | Status: AC | PRN
  Administered 2022-05-09: 500 [IU]

## 2022-05-09 MED ORDER — SODIUM CHLORIDE 0.9 % IV SOLN
200.0000 mg | Freq: Once | INTRAVENOUS | Status: AC
  Administered 2022-05-09: 200 mg via INTRAVENOUS
  Filled 2022-05-09: qty 10

## 2022-05-09 NOTE — Progress Notes (Signed)
Patients port flushed without difficulty.  Good blood return noted with no bruising or swelling noted at site.  Home infusion 5FU pump disconnected without issues. Patient remains accessed for iron infusion in the infusion clinic.

## 2022-05-09 NOTE — Patient Instructions (Signed)
[facility]  Discharge Instructions: Thank you for choosing [facility] to provide your oncology and hematology care.  If you have a lab appointment with the Cancer Center, please come in thru the Main Entrance and check in at the main information desk.  Wear comfortable clothing and clothing appropriate for easy access to any Portacath or PICC line.   We strive to give you quality time with your provider. You may need to reschedule your appointment if you arrive late (15 or more minutes).  Arriving late affects you and other patients whose appointments are after yours.  Also, if you miss three or more appointments without notifying the office, you may be dismissed from the clinic at the provider's discretion.      For prescription refill requests, have your pharmacy contact our office and allow 72 hours for refills to be completed.    To help prevent nausea and vomiting after your treatment, we encourage you to take your nausea medication as directed.  BELOW ARE SYMPTOMS THAT SHOULD BE REPORTED IMMEDIATELY: *FEVER GREATER THAN 100.4 F (38 C) OR HIGHER *CHILLS OR SWEATING *NAUSEA AND VOMITING THAT IS NOT CONTROLLED WITH YOUR NAUSEA MEDICATION *UNUSUAL SHORTNESS OF BREATH *UNUSUAL BRUISING OR BLEEDING *URINARY PROBLEMS (pain or burning when urinating, or frequent urination) *BOWEL PROBLEMS (unusual diarrhea, constipation, pain near the anus) TENDERNESS IN MOUTH AND THROAT WITH OR WITHOUT PRESENCE OF ULCERS (sore throat, sores in mouth, or a toothache) UNUSUAL RASH, SWELLING OR PAIN  UNUSUAL VAGINAL DISCHARGE OR ITCHING   Items with * indicate a potential emergency and should be followed up as soon as possible or go to the Emergency Department if any problems should occur.  Please show the CHEMOTHERAPY ALERT CARD or IMMUNOTHERAPY ALERT CARD at check-in to the Emergency Department and triage nurse.  Should you have questions after your visit or need to  cancel or reschedule your appointment, please contact [facility] [phone]  and follow the prompts.  Office hours are 8:00 a.m. to 4:30 p.m. Monday - Friday. Please note that voicemails left after 4:00 p.m. may not be returned until the following business day.  We are closed weekends and major holidays. You have access to a nurse at all times for urgent questions. Please call the main number to the clinic [phone] and follow the prompts.  For any non-urgent questions, you may also contact your provider using MyChart. We now offer e-Visits for anyone 18 and older to request care online for non-urgent symptoms. For details visit [email or web address].   Also download the MyChart app! Go to the app store, search "MyChart", open the app, select [facility], and log in with your MyChart username and password.   

## 2022-05-09 NOTE — Progress Notes (Addendum)
Diagnosis: Iron Deficiency Anemia  Provider:   Narda Rutherford IV MD  Procedure: Infusion  IV Type: Port a Cath, IV Location: R Chest  Venofer (Iron Sucrose), Dose: 200 mg  Infusion Start Time: [id number]  Infusion Stop Time: [id number]  Post Infusion IV Care: Patient declined observation and Port a Cath Deaccessed/Flushed  Discharge: Condition: Good, Destination: Home . AVS Provided  Performed by:  Baxter Hire, RN    Heparin was supplied by Denton Ar, RN from [facility], was given after the completion of the iron infusion and saline flush.

## 2022-05-10 ENCOUNTER — Ambulatory Visit: Payer: Medicare HMO

## 2022-05-10 ENCOUNTER — Encounter: Payer: Self-pay | Admitting: Hematology and Oncology

## 2022-05-10 ENCOUNTER — Encounter (HOSPITAL_COMMUNITY): Payer: Self-pay | Admitting: Hematology and Oncology

## 2022-05-11 ENCOUNTER — Ambulatory Visit: Payer: Medicare HMO

## 2022-05-13 ENCOUNTER — Ambulatory Visit: Payer: Medicare HMO

## 2022-05-14 ENCOUNTER — Other Ambulatory Visit: Payer: Self-pay

## 2022-05-14 ENCOUNTER — Telehealth: Payer: Self-pay

## 2022-05-14 ENCOUNTER — Ambulatory Visit
Admission: RE | Admit: 2022-05-14 | Discharge: 2022-05-14 | Disposition: A | Discharge status: Home / Self Care | Payer: Medicare HMO | Source: Ambulatory Visit | Attending: Radiation Oncology | Admitting: Radiation Oncology

## 2022-05-14 ENCOUNTER — Ambulatory Visit: Payer: Medicare HMO

## 2022-05-14 DIAGNOSIS — C20 Malignant neoplasm of rectum: Secondary | ICD-10-CM

## 2022-05-14 DIAGNOSIS — C7951 Secondary malignant neoplasm of bone: Secondary | ICD-10-CM | POA: Diagnosis not present

## 2022-05-14 DIAGNOSIS — Z510 Encounter for antineoplastic radiation therapy: Secondary | ICD-10-CM | POA: Diagnosis not present

## 2022-05-14 LAB — RAD ONC ARIA SESSION SUMMARY
Course Elapsed Days: 0
Plan Fractions Treated to Date: 1
Plan Fractions Treated to Date: 1
Plan Fractions Treated to Date: 1
Plan Prescribed Dose Per Fraction: 2.5 Gy
Plan Prescribed Dose Per Fraction: 2.5 Gy
Plan Prescribed Dose Per Fraction: 2.5 Gy
Plan Total Fractions Prescribed: 15
Plan Total Fractions Prescribed: 15
Plan Total Fractions Prescribed: 15
Plan Total Prescribed Dose: 37.5 Gy
Plan Total Prescribed Dose: 37.5 Gy
Plan Total Prescribed Dose: 37.5 Gy
Reference Point Dosage Given to Date: 2.5 Gy
Reference Point Dosage Given to Date: 2.5 Gy
Reference Point Dosage Given to Date: 2.5 Gy
Reference Point Session Dosage Given: 2.5 Gy
Reference Point Session Dosage Given: 2.5 Gy
Reference Point Session Dosage Given: 2.5 Gy
Session Number: 1

## 2022-05-14 MED ORDER — NYSTATIN-TRIAMCINOLONE 100000-0.1 UNIT/GM-% EX OINT: 1.0000 | TOPICAL_OINTMENT | Freq: Three times a day (TID) | CUTANEOUS | 0 refills | Status: DC

## 2022-05-14 NOTE — Telephone Encounter (Signed)
Pt called to report of having a rash and burning on the right groin and wanted to know what he could take for it. Stated it has been going on for three days. It comes and goes no bleeding. He had been taking pain meds that he had on hand. I let Ilda Foil, RN know about the Pt phone call and his concerns.   Audry Riles, CMA

## 2022-05-14 NOTE — Telephone Encounter (Signed)
Returned call to pt regarding rash in  right groin. Pt states it is red and tender. Per Dr Wilmon Pali cream sent in to pt's pharmacy. Pt notified prescription had been sent in. Educated pt to not apply cream 4 hours prior to radiation treatments, otherwise could use up to 3 times a day. Pt acknowledged information and verbalized understanding.

## 2022-05-15 ENCOUNTER — Ambulatory Visit
Admission: RE | Admit: 2022-05-15 | Discharge: 2022-05-15 | Disposition: A | Discharge status: Home / Self Care | Payer: Medicare HMO | Source: Ambulatory Visit | Attending: Radiation Oncology | Admitting: Radiation Oncology

## 2022-05-15 ENCOUNTER — Other Ambulatory Visit: Payer: Self-pay

## 2022-05-15 DIAGNOSIS — C20 Malignant neoplasm of rectum: Secondary | ICD-10-CM | POA: Diagnosis not present

## 2022-05-15 DIAGNOSIS — Z510 Encounter for antineoplastic radiation therapy: Secondary | ICD-10-CM | POA: Diagnosis not present

## 2022-05-15 DIAGNOSIS — C7951 Secondary malignant neoplasm of bone: Secondary | ICD-10-CM | POA: Diagnosis not present

## 2022-05-15 LAB — RAD ONC ARIA SESSION SUMMARY
Course Elapsed Days: 1
Plan Fractions Treated to Date: 2
Plan Fractions Treated to Date: 2
Plan Fractions Treated to Date: 2
Plan Prescribed Dose Per Fraction: 2.5 Gy
Plan Prescribed Dose Per Fraction: 2.5 Gy
Plan Prescribed Dose Per Fraction: 2.5 Gy
Plan Total Fractions Prescribed: 15
Plan Total Fractions Prescribed: 15
Plan Total Fractions Prescribed: 15
Plan Total Prescribed Dose: 37.5 Gy
Plan Total Prescribed Dose: 37.5 Gy
Plan Total Prescribed Dose: 37.5 Gy
Reference Point Dosage Given to Date: 5 Gy
Reference Point Dosage Given to Date: 5 Gy
Reference Point Dosage Given to Date: 5 Gy
Reference Point Session Dosage Given: 2.5 Gy
Reference Point Session Dosage Given: 2.5 Gy
Reference Point Session Dosage Given: 2.5 Gy
Session Number: 2

## 2022-05-16 ENCOUNTER — Other Ambulatory Visit: Payer: Self-pay

## 2022-05-16 ENCOUNTER — Ambulatory Visit
Admission: RE | Admit: 2022-05-16 | Discharge: 2022-05-16 | Disposition: A | Discharge status: Home / Self Care | Payer: Medicare HMO | Source: Ambulatory Visit | Attending: Radiation Oncology | Admitting: Radiation Oncology

## 2022-05-16 ENCOUNTER — Encounter (HOSPITAL_COMMUNITY): Admission: RE | Admit: 2022-05-16 | Payer: Medicare HMO | Source: Ambulatory Visit

## 2022-05-16 DIAGNOSIS — C20 Malignant neoplasm of rectum: Secondary | ICD-10-CM | POA: Diagnosis not present

## 2022-05-16 DIAGNOSIS — C7951 Secondary malignant neoplasm of bone: Secondary | ICD-10-CM | POA: Diagnosis not present

## 2022-05-16 DIAGNOSIS — Z510 Encounter for antineoplastic radiation therapy: Secondary | ICD-10-CM | POA: Diagnosis not present

## 2022-05-16 LAB — RAD ONC ARIA SESSION SUMMARY
Course Elapsed Days: 2
Plan Fractions Treated to Date: 3
Plan Fractions Treated to Date: 3
Plan Fractions Treated to Date: 3
Plan Prescribed Dose Per Fraction: 2.5 Gy
Plan Prescribed Dose Per Fraction: 2.5 Gy
Plan Prescribed Dose Per Fraction: 2.5 Gy
Plan Total Fractions Prescribed: 15
Plan Total Fractions Prescribed: 15
Plan Total Fractions Prescribed: 15
Plan Total Prescribed Dose: 37.5 Gy
Plan Total Prescribed Dose: 37.5 Gy
Plan Total Prescribed Dose: 37.5 Gy
Reference Point Dosage Given to Date: 7.5 Gy
Reference Point Dosage Given to Date: 7.5 Gy
Reference Point Dosage Given to Date: 7.5 Gy
Reference Point Session Dosage Given: 2.5 Gy
Reference Point Session Dosage Given: 2.5 Gy
Reference Point Session Dosage Given: 2.5 Gy
Session Number: 3

## 2022-05-17 ENCOUNTER — Ambulatory Visit: Payer: Medicare HMO

## 2022-05-17 ENCOUNTER — Other Ambulatory Visit: Payer: Self-pay

## 2022-05-17 ENCOUNTER — Ambulatory Visit
Admission: RE | Admit: 2022-05-17 | Discharge: 2022-05-17 | Disposition: A | Discharge status: Home / Self Care | Payer: Medicare HMO | Source: Ambulatory Visit | Attending: Radiation Oncology | Admitting: Radiation Oncology

## 2022-05-17 DIAGNOSIS — C7951 Secondary malignant neoplasm of bone: Secondary | ICD-10-CM | POA: Diagnosis not present

## 2022-05-17 DIAGNOSIS — C20 Malignant neoplasm of rectum: Secondary | ICD-10-CM | POA: Diagnosis not present

## 2022-05-17 DIAGNOSIS — Z510 Encounter for antineoplastic radiation therapy: Secondary | ICD-10-CM | POA: Diagnosis not present

## 2022-05-17 LAB — RAD ONC ARIA SESSION SUMMARY
Course Elapsed Days: 3
Plan Fractions Treated to Date: 4
Plan Fractions Treated to Date: 4
Plan Fractions Treated to Date: 4
Plan Prescribed Dose Per Fraction: 2.5 Gy
Plan Prescribed Dose Per Fraction: 2.5 Gy
Plan Prescribed Dose Per Fraction: 2.5 Gy
Plan Total Fractions Prescribed: 15
Plan Total Fractions Prescribed: 15
Plan Total Fractions Prescribed: 15
Plan Total Prescribed Dose: 37.5 Gy
Plan Total Prescribed Dose: 37.5 Gy
Plan Total Prescribed Dose: 37.5 Gy
Reference Point Dosage Given to Date: 10 Gy
Reference Point Dosage Given to Date: 10 Gy
Reference Point Dosage Given to Date: 10 Gy
Reference Point Session Dosage Given: 2.5 Gy
Reference Point Session Dosage Given: 2.5 Gy
Reference Point Session Dosage Given: 2.5 Gy
Session Number: 4

## 2022-05-20 ENCOUNTER — Telehealth: Payer: Self-pay | Admitting: Radiation Oncology

## 2022-05-20 ENCOUNTER — Other Ambulatory Visit: Payer: Self-pay

## 2022-05-20 ENCOUNTER — Ambulatory Visit
Admission: RE | Admit: 2022-05-20 | Discharge: 2022-05-20 | Disposition: A | Discharge status: Home / Self Care | Payer: Medicare HMO | Source: Ambulatory Visit | Attending: Radiation Oncology | Admitting: Radiation Oncology

## 2022-05-20 DIAGNOSIS — Z510 Encounter for antineoplastic radiation therapy: Secondary | ICD-10-CM | POA: Diagnosis not present

## 2022-05-20 DIAGNOSIS — C20 Malignant neoplasm of rectum: Secondary | ICD-10-CM | POA: Diagnosis not present

## 2022-05-20 DIAGNOSIS — C7951 Secondary malignant neoplasm of bone: Secondary | ICD-10-CM | POA: Diagnosis not present

## 2022-05-20 LAB — RAD ONC ARIA SESSION SUMMARY
Course Elapsed Days: 6
Plan Fractions Treated to Date: 5
Plan Fractions Treated to Date: 5
Plan Fractions Treated to Date: 5
Plan Prescribed Dose Per Fraction: 2.5 Gy
Plan Prescribed Dose Per Fraction: 2.5 Gy
Plan Prescribed Dose Per Fraction: 2.5 Gy
Plan Total Fractions Prescribed: 15
Plan Total Fractions Prescribed: 15
Plan Total Fractions Prescribed: 15
Plan Total Prescribed Dose: 37.5 Gy
Plan Total Prescribed Dose: 37.5 Gy
Plan Total Prescribed Dose: 37.5 Gy
Reference Point Dosage Given to Date: 12.5 Gy
Reference Point Dosage Given to Date: 12.5 Gy
Reference Point Dosage Given to Date: 12.5 Gy
Reference Point Session Dosage Given: 2.5 Gy
Reference Point Session Dosage Given: 2.5 Gy
Reference Point Session Dosage Given: 2.5 Gy
Session Number: 5

## 2022-05-20 MED FILL — Dexamethasone Sodium Phosphate Inj 100 MG/10ML: INTRAMUSCULAR | Qty: 1 | Status: AC

## 2022-05-20 NOTE — Telephone Encounter (Signed)
4/8 @ 11:12 AM Patient's daughter Lanora Manis called to reschedule his treatment appt for today to an earlier time.  Called L3 machine spoke to [facility], they are aware.

## 2022-05-21 ENCOUNTER — Ambulatory Visit
Admission: RE | Admit: 2022-05-21 | Discharge: 2022-05-21 | Disposition: A | Discharge status: Home / Self Care | Payer: Medicare HMO | Source: Ambulatory Visit | Attending: Radiation Oncology | Admitting: Radiation Oncology

## 2022-05-21 ENCOUNTER — Inpatient Hospital Stay: Payer: Medicare HMO

## 2022-05-21 ENCOUNTER — Inpatient Hospital Stay: Payer: Medicare HMO | Admitting: Hematology and Oncology

## 2022-05-21 ENCOUNTER — Other Ambulatory Visit: Payer: Self-pay

## 2022-05-21 ENCOUNTER — Other Ambulatory Visit: Payer: Self-pay | Admitting: Hematology and Oncology

## 2022-05-21 VITALS — BP 147/83 | HR 70 | Temp 98.5°F | Resp 20

## 2022-05-21 DIAGNOSIS — D509 Iron deficiency anemia, unspecified: Secondary | ICD-10-CM | POA: Insufficient documentation

## 2022-05-21 DIAGNOSIS — D75839 Thrombocytosis, unspecified: Secondary | ICD-10-CM | POA: Insufficient documentation

## 2022-05-21 DIAGNOSIS — C7951 Secondary malignant neoplasm of bone: Secondary | ICD-10-CM | POA: Insufficient documentation

## 2022-05-21 DIAGNOSIS — Z79899 Other long term (current) drug therapy: Secondary | ICD-10-CM | POA: Insufficient documentation

## 2022-05-21 DIAGNOSIS — I10 Essential (primary) hypertension: Secondary | ICD-10-CM | POA: Insufficient documentation

## 2022-05-21 DIAGNOSIS — C20 Malignant neoplasm of rectum: Secondary | ICD-10-CM

## 2022-05-21 DIAGNOSIS — C779 Secondary and unspecified malignant neoplasm of lymph node, unspecified: Secondary | ICD-10-CM | POA: Insufficient documentation

## 2022-05-21 DIAGNOSIS — Z5111 Encounter for antineoplastic chemotherapy: Secondary | ICD-10-CM | POA: Insufficient documentation

## 2022-05-21 DIAGNOSIS — Z95828 Presence of other vascular implants and grafts: Secondary | ICD-10-CM

## 2022-05-21 DIAGNOSIS — Z5112 Encounter for antineoplastic immunotherapy: Secondary | ICD-10-CM | POA: Insufficient documentation

## 2022-05-21 DIAGNOSIS — Z510 Encounter for antineoplastic radiation therapy: Secondary | ICD-10-CM | POA: Diagnosis not present

## 2022-05-21 LAB — RAD ONC ARIA SESSION SUMMARY
Course Elapsed Days: 7
Plan Fractions Treated to Date: 6
Plan Fractions Treated to Date: 6
Plan Fractions Treated to Date: 6
Plan Prescribed Dose Per Fraction: 2.5 Gy
Plan Prescribed Dose Per Fraction: 2.5 Gy
Plan Prescribed Dose Per Fraction: 2.5 Gy
Plan Total Fractions Prescribed: 15
Plan Total Fractions Prescribed: 15
Plan Total Fractions Prescribed: 15
Plan Total Prescribed Dose: 37.5 Gy
Plan Total Prescribed Dose: 37.5 Gy
Plan Total Prescribed Dose: 37.5 Gy
Reference Point Dosage Given to Date: 15 Gy
Reference Point Dosage Given to Date: 15 Gy
Reference Point Dosage Given to Date: 15 Gy
Reference Point Session Dosage Given: 2.5 Gy
Reference Point Session Dosage Given: 2.5 Gy
Reference Point Session Dosage Given: 2.5 Gy
Session Number: 6

## 2022-05-21 LAB — CBC WITH DIFFERENTIAL (CANCER CENTER ONLY)
Abs Immature Granulocytes: 0.02 10*3/uL (ref 0.00–0.07)
Basophils Absolute: 0 10*3/uL (ref 0.0–0.1)
Basophils Relative: 1 %
Eosinophils Absolute: 0.2 10*3/uL (ref 0.0–0.5)
Eosinophils Relative: 5 %
HCT: 33.5 % — ABNORMAL LOW (ref 39.0–52.0)
Hemoglobin: 10.9 g/dL — ABNORMAL LOW (ref 13.0–17.0)
Immature Granulocytes: 1 %
Lymphocytes Relative: 15 %
Lymphs Abs: 0.6 10*3/uL — ABNORMAL LOW (ref 0.7–4.0)
MCH: 26.7 pg (ref 26.0–34.0)
MCHC: 32.5 g/dL (ref 30.0–36.0)
MCV: 81.9 fL (ref 80.0–100.0)
Monocytes Absolute: 0.4 10*3/uL (ref 0.1–1.0)
Monocytes Relative: 10 %
Neutro Abs: 2.8 10*3/uL (ref 1.7–7.7)
Neutrophils Relative %: 68 %
Platelet Count: 181 10*3/uL (ref 150–400)
RBC: 4.09 MIL/uL — ABNORMAL LOW (ref 4.22–5.81)
RDW: 22.1 % — ABNORMAL HIGH (ref 11.5–15.5)
WBC Count: 4 10*3/uL (ref 4.0–10.5)
nRBC: 0 % (ref 0.0–0.2)

## 2022-05-21 LAB — CMP (CANCER CENTER ONLY)
ALT: 9 U/L (ref 0–44)
AST: 18 U/L (ref 15–41)
Albumin: 3.6 g/dL (ref 3.5–5.0)
Alkaline Phosphatase: 154 U/L — ABNORMAL HIGH (ref 38–126)
Anion gap: 9 (ref 5–15)
BUN: 11 mg/dL (ref 8–23)
CO2: 25 mmol/L (ref 22–32)
Calcium: 9 mg/dL (ref 8.9–10.3)
Chloride: 107 mmol/L (ref 98–111)
Creatinine: 0.62 mg/dL (ref 0.61–1.24)
GFR, Estimated: >60 mL/min (ref >60)
Glucose, Bld: 186 mg/dL — ABNORMAL HIGH (ref 70–99)
Potassium: 4 mmol/L (ref 3.5–5.1)
Sodium: 141 mmol/L (ref 135–145)
Total Bilirubin: 0.5 mg/dL (ref 0.3–1.2)
Total Protein: 6.8 g/dL (ref 6.5–8.1)

## 2022-05-21 MED ORDER — SODIUM CHLORIDE 0.9 % IV SOLN
2400.0000 mg/m2 | INTRAVENOUS | Status: DC
  Administered 2022-05-21: 5000 mg via INTRAVENOUS
  Filled 2022-05-21: qty 100

## 2022-05-21 MED ORDER — SODIUM CHLORIDE 0.9 % IV SOLN
5.0000 mg/kg | Freq: Once | INTRAVENOUS | Status: AC
  Administered 2022-05-21: 500 mg via INTRAVENOUS
  Filled 2022-05-21: qty 4

## 2022-05-21 MED ORDER — LEUCOVORIN CALCIUM INJECTION 350 MG
400.0000 mg/m2 | Freq: Once | INTRAVENOUS | Status: AC
  Administered 2022-05-21: 876 mg via INTRAVENOUS
  Filled 2022-05-21: qty 43.8

## 2022-05-21 MED ORDER — SODIUM CHLORIDE 0.9% FLUSH: 10.0000 mL | INTRAVENOUS | Status: DC | PRN

## 2022-05-21 MED ORDER — HYDROMORPHONE HCL 2 MG PO TABS: 2.0000 mg | ORAL_TABLET | ORAL | 0 refills | Status: DC | PRN

## 2022-05-21 MED ORDER — SODIUM CHLORIDE 0.9 % IV SOLN
10.0000 mg | Freq: Once | INTRAVENOUS | Status: AC
  Administered 2022-05-21: 10 mg via INTRAVENOUS
  Filled 2022-05-21: qty 10

## 2022-05-21 MED ORDER — SODIUM CHLORIDE 0.9 % IV SOLN: Freq: Once | INTRAVENOUS | Status: AC

## 2022-05-21 MED ORDER — PALONOSETRON HCL INJECTION 0.25 MG/5ML
0.2500 mg | Freq: Once | INTRAVENOUS | Status: AC
  Administered 2022-05-21: 0.25 mg via INTRAVENOUS
  Filled 2022-05-21: qty 5

## 2022-05-21 MED ORDER — HEPARIN SOD (PORK) LOCK FLUSH 100 UNIT/ML IV SOLN: 500.0000 [IU] | Freq: Once | INTRAVENOUS | Status: DC | PRN

## 2022-05-21 MED ORDER — SODIUM CHLORIDE 0.9% FLUSH
10.0000 mL | Freq: Once | INTRAVENOUS | Status: AC
  Administered 2022-05-21: 10 mL

## 2022-05-21 MED ORDER — FLUOROURACIL CHEMO INJECTION 2.5 GM/50ML
400.0000 mg/m2 | Freq: Once | INTRAVENOUS | Status: AC
  Administered 2022-05-21: 900 mg via INTRAVENOUS
  Filled 2022-05-21: qty 18

## 2022-05-21 MED ORDER — DEXTROSE 5 % IV SOLN: Freq: Once | INTRAVENOUS | Status: AC

## 2022-05-21 MED ORDER — OXALIPLATIN CHEMO INJECTION 100 MG/20ML
85.0000 mg/m2 | Freq: Once | INTRAVENOUS | Status: AC
  Administered 2022-05-21: 200 mg via INTRAVENOUS
  Filled 2022-05-21: qty 40

## 2022-05-21 NOTE — Progress Notes (Signed)
[facility] [phone]   [phone]  PROGRESS NOTE  Patient Care Team: [name], MD as PCP - General (Hematology and Oncology)  Metastatic rectal carcinoma  ONCOLOGIC HISTORY: # Metastatic Rectal Adenocarcinoma 02/03/2022-02/11/2022: Presented to ED due to left hip pain after a mechanical fall.  02/04/2022: CT left AVW:UJWJX pathologic fracture of the left femoral neck with displacement and angulation, with underlying lytic lesions in the femoral head and neck.Additional lytic destructive lesion of the left parasymphyseal pubic bone extending throughout the superior pubic ramus to the puboacetabular junction, with pathologic fracture of the superior pubic ramus. Adjacent hypoattenuation in the pelvic component of the obturator internus muscle, suspicious for tumor involvement. Probable small lytic lesion in the inferior pubic ramus.Prominent left external iliac lymph nodes measuring up to 1.2 cm. 02/05/2022: CT CAP: Pathologic retroperitoneal and bilateral pelvic adenopathy, with lytic destructive lesions of the left superior and inferior pubic ramus and pubic body as well as a pathologic fracture through the left femoral neck. Pathologic fracture anteriorly in the right second rib. Deformity anteriorly in the right third rib probably from early pathologic fracture. Metastatic disease or myeloma strongly favored over multifocal osteomyelitis as a cause. Mildly enlarged left supraclavicular lymph node and lower thoracic periaortic lymph node. Pathologic retroperitoneal and pelvic adenopathy. 10 cm long segment of rectal wall thickening.1.1 cm exophytic lesion from the left mid kidney posterolaterally, nonspecific for complex cyst versus tumor. Bandlike atelectasis in both lungs.Small type 1 hiatal hernia.Lumbar spondylosis and degenerative disc disease causing generally mild multilevel impingement. Sigmoid colon diverticulosis.Wall thickening in the  proximal stomach body, probably secondary to nondistention. 02/06/2022: Underwent left total hip arthroplasty. Pathology revealed poorly differentiated carcinoma.  SPEP/IFE did not detect monoclonal protein. PSA normal. 02/25/2022: Underwent colonoscopy that showed malignant tumor in the rectum. Pathology confirmed poorly differentiated carcinoma.  04/05/2022: Underwent port placement 04/08/2022: Cycle 1, Day 1 of FOLFOX (held bevacizumab due to recent port placement) 04/23/2022: Cycle 2, Day 1 of FOLFOX plus bevacizumab 05/07/2022: Cycle 3, Day 1 of FOLFOX plus bevacizumab 05/21/2022: Cycle 4, Day 1 of FOLFOX plus bevacizumab  HISTORY OF PRESENTING ILLNESS:  Joseph Hogan 62 y.o. male returns for follow-up for metastatic rectal cancer. He presents today to start Cycle 4, Day 1 of FOLFOX. He is accompanied by his daughter for this visit.   On exam today, Joseph Hogan reports he has been well overall in the interim since our last visit.  He reports that he does still continue to have shoulder pain which is his biggest issue.  He reports that his right on top of his shoulder.  He has not seen much relief with the radiation therapy and thinks that his pain medications "do not touch it".  He notes that he would be 100% without that shoulder pain.  Is currently an 8 out of 10 when moving but at rest it is occasionally a 0 out of 10.  He notes that he worries that his walker may be putting pressure on his shoulder which may be causing worsening symptoms.  The chemotherapy has been very well-tolerated.  He is not having any nausea, vomiting, or diarrhea though he is having a lot of cold sensitivity the last for 2 days after treatment.  He reports that then disappears.  His bowel movements have been solid and he has been eating quite well.  His blood pressure is up today but he thinks it is because the pain in his shoulder is flaring.  Overall he  feels well and is willing and able to proceed with chemotherapy treatment  at this time.Marland Kitchen  He denies fevers, chills, night sweats, shortness of breath, chest pain, cough, peripheral neuropathy or cold sensitivity.  He has no other complaints.  Rest of the 10 point ROS is below.  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   with abnormal R wave progression noted on EKG   MRSA (methicillin resistant Staphylococcus aureus)    Pre-diabetes    Rectal carcinoma (HCC) 03/19/2022    SURGICAL HISTORY: Past Surgical History:  Procedure Laterality Date   COLONOSCOPY     HUMERUS IM NAIL Right 04/12/2022   Procedure: INTRAMEDULLARY (IM) NAIL HUMERAL;  Surgeon: Bjorn Pippin, MD;  Location: [facility];  Service: Orthopedics;  Laterality: Right;   IR IMAGING GUIDED PORT INSERTION  04/05/2022   TOTAL HIP ARTHROPLASTY Left 02/06/2022   Procedure: TOTAL HIP ARTHROPLASTY;  Surgeon: Joen Laura, MD;  Location: [facility];  Service: Orthopedics;  Laterality: Left;    SOCIAL HISTORY: Social History   Socioeconomic History   Marital status: Divorced    Spouse name: Not on file   Number of children: Not on file   Years of education: Not on file   Highest education level: Not on file  Occupational History   Not on file  Tobacco Use   Smoking status: Never   Smokeless tobacco: Never  Vaping Use   Vaping Use: Never used  Substance and Sexual Activity   Alcohol use: No   Drug use: No   Sexual activity: Not on file  Other Topics Concern   Not on file  Social History Narrative   Not on file   Social Determinants of Health   Financial Resource Strain: Not on file  Food Insecurity: No Food Insecurity (02/04/2022)   Hunger Vital Sign    Worried About Running Out of Food in the Last Year: Never true    Ran Out of Food in the Last Year: Never true  Transportation Needs: No Transportation Needs (02/04/2022)   PRAPARE - Scientist, research (physical sciences) (Medical): No    Lack of Transportation (Non-Medical): No  Physical Activity: Not on file  Stress: Not on file  Social Connections: Not on file  Intimate Partner Violence: Not At Risk (02/04/2022)   Humiliation, Afraid, Rape, and Kick questionnaire    Fear of Current or Ex-Partner: No    Emotionally Abused: No    Physically Abused: No    Sexually Abused: No    FAMILY HISTORY: Family History  Problem Relation Age of Onset   Stroke Mother    Hypertension Mother    Heart disease Father    Alzheimer's disease Father    Hypertension Brother    Alcohol abuse Brother    Cancer Maternal Aunt    Heart disease Maternal Aunt    Cancer Maternal Uncle    Heart disease Maternal Uncle    Cancer Paternal Aunt    Heart disease Paternal Aunt    Cancer Paternal Uncle    Heart disease Paternal Uncle    Heart disease Paternal Grandmother    Alzheimer's disease Paternal Grandfather    Stomach cancer Neg Hx    Rectal cancer Neg Hx    Esophageal cancer Neg Hx    Colon cancer Neg Hx     ALLERGIES:  is  allergic to codeine and lisinopril.  MEDICATIONS:  Current Outpatient Medications  Medication Sig Dispense Refill   aspirin (ASPIRIN CHILDRENS) 81 MG chewable tablet Chew 1 tablet (81 mg total) by mouth 2 (two) times daily. For 6 weeks for DVT prophylaxis after surgery 84 tablet 0   b complex vitamins capsule Take 1 capsule by mouth daily.     HYDROmorphone (DILAUDID) 2 MG tablet Take 1 tablet (2 mg total) by mouth every 4 (four) hours as needed for severe pain. 90 tablet 0   lidocaine-prilocaine (EMLA) cream Apply 1 Application topically as needed. 30 g 0   methocarbamol (ROBAXIN) 500 MG tablet Take 1 tablet (500 mg total) by mouth every 8 (eight) hours as needed for muscle spasms. 30 tablet 0   Multiple Vitamin (MULTIVITAMIN WITH MINERALS) TABS tablet Take 1 tablet by mouth daily.     nystatin-triamcinolone ointment (MYCOLOG) Apply 1 Application topically 3 (three) times daily.  Do not apply 4 hours prior to Radiation treatment 30 g 0   ondansetron (ZOFRAN) 8 MG tablet Take 11.5 tablets (92 mg total) by mouth every 8 (eight) hours as needed for nausea or vomiting. (Patient taking differently: Take 8-16 mg by mouth every 8 (eight) hours as needed for nausea or vomiting.) 90 tablet 0   oxyCODONE ER (XTAMPZA ER) 13.5 MG C12A Take 13.5 mg by mouth every 8 (eight) hours as needed. 90 capsule 0   prochlorperazine (COMPAZINE) 10 MG tablet Take 1 tablet (10 mg total) by mouth every 6 (six) hours as needed for nausea or vomiting. 90 tablet 0   VITAMIN A PO Take 1 tablet by mouth daily.     No current facility-administered medications for this visit.   Facility-Administered Medications Ordered in Other Visits  Medication Dose Route Frequency Provider Last Rate Last Admin   fluorouracil (ADRUCIL) 5,000 mg in sodium chloride 0.9 % 150 mL chemo infusion  2,400 mg/m2 (Treatment Plan Recorded) Intravenous 1 day or 1 dose [name], MD   Infusion Verify at 05/21/22 1437   heparin lock flush 100 unit/mL  500 Units Intracatheter Once PRN [name], MD       sodium chloride flush (NS) 0.9 % injection 10 mL  10 mL Intracatheter PRN [name], MD        REVIEW OF SYSTEMS:   Constitutional: ( - ) fevers, ( - )  chills , ( - ) night sweats Eyes: ( - ) blurriness of vision, ( - ) double vision, ( - ) watery eyes Ears, nose, mouth, throat, and face: ( - ) mucositis, ( - ) sore throat Respiratory: ( - ) cough, ( - ) dyspnea, ( - ) wheezes Cardiovascular: ( - ) palpitation, ( - ) chest discomfort, ( - ) lower extremity swelling Gastrointestinal:  ( - ) nausea, ( - ) heartburn, ( - ) change in bowel habits Skin: ( - ) abnormal skin rashes Lymphatics: ( - ) new lymphadenopathy, ( - ) easy bruising Neurological: ( - ) numbness, ( - ) tingling, ( - ) new weaknesses Behavioral/Psych: ( - ) mood change, ( - ) new changes  All other systems were reviewed with the patient and  are negative.  PHYSICAL EXAMINATION: ECOG PERFORMANCE STATUS: 1 - Symptomatic but completely ambulatory  Vitals:   05/21/22 1001  BP: (!) 163/95  Pulse: 87  Resp: 16  Temp: 97.6 F (36.4 C)  SpO2: 100%    Filed Weights   05/21/22 1001  Weight: 208 lb 14.4 oz (94.8 kg)     GENERAL: well appearing male in NAD. Exam performed in wheelchair.  SKIN: skin color, texture, turgor are normal, no rashes or significant lesions EYES: conjunctiva are pink and non-injected, sclera clear LUNGS: clear to auscultation and percussion with normal breathing effort HEART: regular rate & rhythm and no murmurs and no lower extremity edema Musculoskeletal: no cyanosis of digits and no clubbing  PSYCH: alert & oriented x 3, fluent speech NEURO: no focal motor/sensory deficits  LABORATORY DATA:  I have reviewed the data as listed    Latest Ref Rng & Units 05/21/2022    9:26 AM 05/07/2022    7:59 AM 04/23/2022   11:28 AM  CBC  WBC 4.0 - 10.5 K/uL 4.0  6.5  6.7   Hemoglobin 13.0 - 17.0 g/dL 16.3  84.5  9.9   Hematocrit 39.0 - 52.0 % 33.5  34.0  31.9   Platelets 150 - 400 K/uL 181  260  386        Latest Ref Rng & Units 05/21/2022    9:26 AM 05/07/2022    7:59 AM 04/23/2022   11:28 AM  CMP  Glucose 70 - 99 mg/dL 364  680  321   BUN 8 - 23 mg/dL 11  16  15    Creatinine 0.61 - 1.24 mg/dL 2.24  8.25  0.03   Sodium 135 - 145 mmol/L 141  137  136   Potassium 3.5 - 5.1 mmol/L 4.0  4.3  4.1   Chloride 98 - 111 mmol/L 107  102  101   CO2 22 - 32 mmol/L 25  26  27    Calcium 8.9 - 10.3 mg/dL 9.0  9.2  8.9   Total Protein 6.5 - 8.1 g/dL 6.8  7.3  7.3   Total Bilirubin 0.3 - 1.2 mg/dL 0.5  0.6  0.6   Alkaline Phos 38 - 126 U/L 154  237  316   AST 15 - 41 U/L 18  22  29    ALT 0 - 44 U/L 9  11  13       PATHOLOGY: Rectum, biopsy POORLY DIFFERENTIATED CARCINOMA. SEE NOTE. Diagnosis Note Addendum: Immunohistochemistry shows the carcinoma is positive with MOC-31, cytokeratin 20 and CDX2. Tumor is  negative with cytokeratin 7, cytokeratin 5/6, p40, TTF-1, Napsin A, prostate-specific antigen and prostein. The immunophenotype is consistent with primary colorectal adenocarcinoma.  RADIOGRAPHIC STUDIES: I have personally reviewed the radiological images as listed and agreed with the findings in the report. NM PET Image Initial (PI) Skull Base To Thigh  Result Date: 05/02/2022 CLINICAL DATA:  Treatment strategy for rectal cancer staging. Multiple osseous metastases and fractures. EXAM: NUCLEAR MEDICINE PET SKULL BASE TO THIGH TECHNIQUE: 10.6 mCi F-18 FDG was injected intravenously. Full-ring PET imaging was performed from the skull base to thigh after the radiotracer. CT data was obtained and used for attenuation correction and anatomic localization. Fasting blood glucose: 147 mg/dl COMPARISON:  CTs of the chest, abdomen and pelvis 02/05/2022. FINDINGS: Mediastinal blood pool activity: SUV max 2.2 NECK: No hypermetabolic cervical lymph nodes are identified. No suspicious activity identified within the pharyngeal mucosal space. Incidental CT findings: Bilateral carotid atherosclerosis. CHEST: Hypermetabolic left supraclavicular lymph node measuring 7 mm on image 73/3 (SUV max 3.9). No other hypermetabolic mediastinal, hilar or axillary lymph nodes. No hypermetabolic pulmonary activity or suspicious nodularity. Incidental CT findings: Similar mild chronic lung disease with scattered linear scarring or atelectasis. No suspicious pulmonary nodules. Right IJ  Port-A-Cath extends to the upper right atrium. Mild aortic and great vessel atherosclerosis. ABDOMEN/PELVIS: There is no hypermetabolic activity within the liver, adrenal glands, spleen or pancreas. As seen on prior CT, there are multiple mildly enlarged retroperitoneal, pelvic and inguinal lymph nodes which demonstrate mildly increased metabolic activity. 1.2 cm aortocaval node on image 174/3 has an SUV max of 5.0. There are external iliac nodes within SUV  max of 3.8 on the right and 4.7 on the left. Several small mildly hypermetabolic left inguinal lymph nodes are also present (SUV max 4.9). Low level metabolic activity is seen throughout the presacral and pelvic soft tissue stranding noted previously, nonspecific and possibly due to tumor extension or inflammation. There is a small focus of hypermetabolic activity along the right aspect of the rectum (SUV max 6.6). There is also focal hypermetabolic activity within the mid descending colon (SUV max 5.9). This is associated with wall thickening, diverticula and soft tissue stranding on the CT images, suspicious for diverticulitis. No ascites or generalized hypermetabolic peritoneal activity. Incidental CT findings: Moderate stool throughout the colon. Right renal cystic lesion showed no metabolic activity and are likely benign; no follow-up imaging recommended. SKELETON: Widespread hypermetabolic osseous metastatic disease. In the spine, there is involvement of the right lateral mass of C1, the L2 spinous process and the upper sacrum. Lesions are also present in both proximal humeri, around the right sacroiliac joint, multiple ribs, throughout the bony pelvis and in the proximal left femur. Interval surgical ORIF of the proximal right humerus in the left femoral neck for pathologic fractures. Large expansile lesion involving the left pubic rami measures up to 7.9 x 4.9 cm on image 256/3 and is associated with moderate heterogeneous hypermetabolic activity (SUV max 8.7). Incidental CT findings: none IMPRESSION: 1. Widespread hypermetabolic osseous metastatic disease as described. 2. Mildly hypermetabolic retroperitoneal, pelvic and inguinal lymph nodes suspicious for nodal metastases. A single small hypermetabolic left supraclavicular lymph node is also suspicious. 3. No evidence of solid organ or pulmonary metastases. 4. Probable diverticulitis involving the mid descending colon. There is nonspecific low level  metabolic activity in the presacral and pelvic soft tissues which could be inflammatory or neoplastic. 5.  Aortic Atherosclerosis (ICD10-I70.0). Electronically Signed   By: Carey Bullocks M.D.   On: 05/02/2022 14:45    ASSESSMENT & PLAN BRENDIN SITU is a 62 y.o. male who presents to the clinic for a follow up for rectal carcinoma.   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  --We reviewed that his cancer is incurable and mainstay treatment is chemotherapy. --Recommend chemotherapy regimen FOLFOX plus Bevacizumab q 2 weeks, started on 04/08/2022.  PLAN: --Due to start Cycle 4, Day 1 of FOLFOX plus bevacizumab today --Labs from today reviewed and adequate for treatment. WBC 4.0, hemoglobin 10.9, MCV 81.9, and platelets 181. --Proceed with treatment today without any dose modification --RTC in 2 weeks for labs and follow up before Cycle 5, Day 1.  --Patient requested to transfer to [facility] in the future since patient lives in [place].  #Microcytic anemia: #Thrombocytosis: --Labs today show improvement of anemia with Hgb 10.9 --Iron panel confirmed iron deficiency.  --patient received IV venofer 200 mg once a week x 3  #Right shoulder pain: --Right shoulder xray form 04/08/2022 showed destructive lesion within the proximal to mid right humerus shaft with acute to subacture  pathologic fracture. --Patient underwent pinning of right humerus on 04/12/2022 --Scheduled for radiation oncology consultation on 04/24/2022. --continue Xtampza 13.5 mg q 12 and Dilaudid 2 mg q 4-6 hours for breakthrough pain  #Right lower extremity neuropathic pain: --Pain well controlled with gabapentin 200 mg TID  #Supportive Care -- chemotherapy education complete -- port placement complete.  -- zofran  8mg  q8H PRN and compazine 10mg  PO q6H for nausea -- EMLA cream for port  Orders Placed This Encounter  Procedures   Ambulatory referral to Hematology / Oncology    Referral Priority:   Routine    Referral Type:   Consultation    Referral Reason:   Specialty Services Required    Requested Specialty:   Oncology    Number of Visits Requested:   1    All questions were answered. The patient knows to call the clinic with any problems, questions or concerns.  I have spent a total of 30 minutes minutes of face-to-face and non-face-to-face time, preparing to see the patient, performing a medically appropriate examination, counseling and educating the patient, ordering medications/tests/procedures,  documenting clinical information in the electronic health record,  and care coordination.   Ulysees BarnsJohn T. Virlee Stroschein, MD Department of Hematology/Oncology [facility] Phone: [phone] Pager: [phone] Email: [email or web address]

## 2022-05-21 NOTE — Patient Instructions (Signed)
[facility] [facility]  Discharge Instructions: Thank you for choosing [facility] to provide your oncology and hematology care.   If you have a lab appointment with the Cancer Center, please go directly to the Cancer Center and check in at the registration area.   Wear comfortable clothing and clothing appropriate for easy access to any Portacath or PICC line.   We strive to give you quality time with your provider. You may need to reschedule your appointment if you arrive late (15 or more minutes).  Arriving late affects you and other patients whose appointments are after yours.  Also, if you miss three or more appointments without notifying the office, you may be dismissed from the clinic at the provider's discretion.      For prescription refill requests, have your pharmacy contact our office and allow 72 hours for refills to be completed.    Today you received the following chemotherapy and/or immunotherapy agents: Bevacizumab, Oxaliplatin, Leucorovin, and Fluorouralcil.   To help prevent nausea and vomiting after your treatment, we encourage you to take your nausea medication as directed.  BELOW ARE SYMPTOMS THAT SHOULD BE REPORTED IMMEDIATELY: *FEVER GREATER THAN 100.4 F (38 C) OR HIGHER *CHILLS OR SWEATING *NAUSEA AND VOMITING THAT IS NOT CONTROLLED WITH YOUR NAUSEA MEDICATION *UNUSUAL SHORTNESS OF BREATH *UNUSUAL BRUISING OR BLEEDING *URINARY PROBLEMS (pain or burning when urinating, or frequent urination) *BOWEL PROBLEMS (unusual diarrhea, constipation, pain near the anus) TENDERNESS IN MOUTH AND THROAT WITH OR WITHOUT PRESENCE OF ULCERS (sore throat, sores in mouth, or a toothache) UNUSUAL RASH, SWELLING OR PAIN  UNUSUAL VAGINAL DISCHARGE OR ITCHING   Items with * indicate a potential emergency and should be followed up as soon as possible or go to the Emergency Department if any problems should occur.  Please show the CHEMOTHERAPY  ALERT CARD or IMMUNOTHERAPY ALERT CARD at check-in to the Emergency Department and triage nurse.  Should you have questions after your visit or need to cancel or reschedule your appointment, please contact [facility]  Dept: [phone]  and follow the prompts.  Office hours are 8:00 a.m. to 4:30 p.m. Monday - Friday. Please note that voicemails left after 4:00 p.m. may not be returned until the following business day.  We are closed weekends and major holidays. You have access to a nurse at all times for urgent questions. Please call the main number to the clinic Dept: [phone] and follow the prompts.   For any non-urgent questions, you may also contact your provider using MyChart. We now offer e-Visits for anyone 85 and older to request care online for non-urgent symptoms. For details visit [email or web address].   Also download the MyChart app! Go to the app store, search "MyChart", open the app, select [place], and log in with your MyChart username and password.  The chemotherapy medication bag should finish at 46 hours, 96 hours, or 7 days. For example, if your pump is scheduled for 46 hours and it was put on at 4:00 p.m., it should finish at 2:00 p.m. the day it is scheduled to come off regardless of your appointment time.     Estimated time to finish at: 12:45 pm   If the display on your pump reads "Low Volume" and it is beeping, take the batteries out of the pump and come to the cancer center for it to be taken off.   If the pump alarms go off prior  to the pump reading "Low Volume" then call [phone] and someone can assist you.  If the plunger comes out and the chemotherapy medication is leaking out, please use your home chemo spill kit to clean up the spill. Do NOT use paper towels or other household products.  If you have problems or questions regarding your pump, please call either [phone] (24 hours a day) or the cancer center  Monday-Friday 8:00 a.m.- 4:30 p.m. at the clinic number and we will assist you. If you are unable to get assistance, then go to the nearest Emergency Department and ask the staff to contact the IV team for assistance.

## 2022-05-22 ENCOUNTER — Other Ambulatory Visit: Payer: Self-pay

## 2022-05-22 ENCOUNTER — Ambulatory Visit
Admission: RE | Admit: 2022-05-22 | Discharge: 2022-05-22 | Disposition: A | Discharge status: Home / Self Care | Payer: Medicare HMO | Source: Ambulatory Visit | Attending: Radiation Oncology | Admitting: Radiation Oncology

## 2022-05-22 DIAGNOSIS — Z510 Encounter for antineoplastic radiation therapy: Secondary | ICD-10-CM | POA: Diagnosis not present

## 2022-05-22 DIAGNOSIS — C20 Malignant neoplasm of rectum: Secondary | ICD-10-CM | POA: Diagnosis not present

## 2022-05-22 DIAGNOSIS — C7951 Secondary malignant neoplasm of bone: Secondary | ICD-10-CM | POA: Diagnosis not present

## 2022-05-22 LAB — RAD ONC ARIA SESSION SUMMARY
Course Elapsed Days: 8
Plan Fractions Treated to Date: 7
Plan Fractions Treated to Date: 7
Plan Fractions Treated to Date: 7
Plan Prescribed Dose Per Fraction: 2.5 Gy
Plan Prescribed Dose Per Fraction: 2.5 Gy
Plan Prescribed Dose Per Fraction: 2.5 Gy
Plan Total Fractions Prescribed: 15
Plan Total Fractions Prescribed: 15
Plan Total Fractions Prescribed: 15
Plan Total Prescribed Dose: 37.5 Gy
Plan Total Prescribed Dose: 37.5 Gy
Plan Total Prescribed Dose: 37.5 Gy
Reference Point Dosage Given to Date: 17.5 Gy
Reference Point Dosage Given to Date: 17.5 Gy
Reference Point Dosage Given to Date: 17.5 Gy
Reference Point Session Dosage Given: 2.5 Gy
Reference Point Session Dosage Given: 2.5 Gy
Reference Point Session Dosage Given: 2.5 Gy
Session Number: 7

## 2022-05-23 ENCOUNTER — Ambulatory Visit
Admission: RE | Admit: 2022-05-23 | Discharge: 2022-05-23 | Disposition: A | Discharge status: Home / Self Care | Payer: Medicare HMO | Source: Ambulatory Visit | Attending: Radiation Oncology | Admitting: Radiation Oncology

## 2022-05-23 ENCOUNTER — Encounter (HOSPITAL_COMMUNITY)
Admission: RE | Admit: 2022-05-23 | Discharge: 2022-05-23 | Disposition: A | Discharge status: Home / Self Care | Payer: Medicare HMO | Source: Ambulatory Visit | Attending: Hematology and Oncology | Admitting: Hematology and Oncology

## 2022-05-23 ENCOUNTER — Inpatient Hospital Stay: Payer: Medicare HMO

## 2022-05-23 ENCOUNTER — Other Ambulatory Visit: Payer: Self-pay

## 2022-05-23 VITALS — BP 148/80 | HR 69 | Temp 98.2°F | Resp 16

## 2022-05-23 VITALS — BP 152/77 | HR 63 | Temp 98.5°F | Resp 16

## 2022-05-23 DIAGNOSIS — C20 Malignant neoplasm of rectum: Secondary | ICD-10-CM | POA: Diagnosis not present

## 2022-05-23 DIAGNOSIS — D500 Iron deficiency anemia secondary to blood loss (chronic): Secondary | ICD-10-CM | POA: Insufficient documentation

## 2022-05-23 DIAGNOSIS — Z510 Encounter for antineoplastic radiation therapy: Secondary | ICD-10-CM | POA: Diagnosis not present

## 2022-05-23 DIAGNOSIS — C7951 Secondary malignant neoplasm of bone: Secondary | ICD-10-CM | POA: Diagnosis not present

## 2022-05-23 LAB — RAD ONC ARIA SESSION SUMMARY
Course Elapsed Days: 9
Plan Fractions Treated to Date: 8
Plan Fractions Treated to Date: 8
Plan Fractions Treated to Date: 8
Plan Prescribed Dose Per Fraction: 2.5 Gy
Plan Prescribed Dose Per Fraction: 2.5 Gy
Plan Prescribed Dose Per Fraction: 2.5 Gy
Plan Total Fractions Prescribed: 15
Plan Total Fractions Prescribed: 15
Plan Total Fractions Prescribed: 15
Plan Total Prescribed Dose: 37.5 Gy
Plan Total Prescribed Dose: 37.5 Gy
Plan Total Prescribed Dose: 37.5 Gy
Reference Point Dosage Given to Date: 20 Gy
Reference Point Dosage Given to Date: 20 Gy
Reference Point Dosage Given to Date: 20 Gy
Reference Point Session Dosage Given: 2.5 Gy
Reference Point Session Dosage Given: 2.5 Gy
Reference Point Session Dosage Given: 2.5 Gy
Session Number: 8

## 2022-05-23 MED ORDER — SODIUM CHLORIDE 0.9% FLUSH
10.0000 mL | INTRAVENOUS | Status: DC | PRN
  Administered 2022-05-23: 10 mL

## 2022-05-23 MED ORDER — DIPHENHYDRAMINE HCL 25 MG PO CAPS: 25.0000 mg | ORAL_CAPSULE | Freq: Once | ORAL | Status: DC

## 2022-05-23 MED ORDER — SODIUM CHLORIDE 0.9 % IV SOLN
200.0000 mg | Freq: Once | INTRAVENOUS | Status: AC
  Administered 2022-05-23: 200 mg via INTRAVENOUS
  Filled 2022-05-23: qty 200

## 2022-05-23 MED ORDER — HEPARIN SOD (PORK) LOCK FLUSH 100 UNIT/ML IV SOLN
500.0000 [IU] | Freq: Once | INTRAVENOUS | Status: AC | PRN
  Administered 2022-05-23: 500 [IU]

## 2022-05-23 MED ORDER — ACETAMINOPHEN 325 MG PO TABS: 650.0000 mg | ORAL_TABLET | Freq: Once | ORAL | Status: DC

## 2022-05-23 NOTE — Addendum Note (Signed)
Encounter addended by: Wyvonne Lenz, RN on: 05/23/2022 12:13 PM  Actions taken: Therapy plan modified

## 2022-05-23 NOTE — Progress Notes (Signed)
Diagnosis: Iron Deficiency Anemia  Provider:   Jeanie Sewer IV MD  Procedure: Infusion  IV Type: Peripheral, IV Location: L Antecubital  Venofer (Iron Sucrose), Dose: 200 mg  Infusion Start Time: 1131  Infusion Stop Time: 1152  Post Infusion IV Care: Patient declined observation and Peripheral IV Discontinued  Discharge: Condition: Good, Destination: Home . AVS Provided  Performed by:  Evelena Peat, RN

## 2022-05-24 ENCOUNTER — Ambulatory Visit
Admission: RE | Admit: 2022-05-24 | Discharge: 2022-05-24 | Disposition: A | Discharge status: Home / Self Care | Payer: Medicare HMO | Source: Ambulatory Visit | Attending: Radiation Oncology | Admitting: Radiation Oncology

## 2022-05-24 ENCOUNTER — Other Ambulatory Visit: Payer: Self-pay

## 2022-05-24 DIAGNOSIS — Z510 Encounter for antineoplastic radiation therapy: Secondary | ICD-10-CM | POA: Diagnosis not present

## 2022-05-24 DIAGNOSIS — C20 Malignant neoplasm of rectum: Secondary | ICD-10-CM | POA: Diagnosis not present

## 2022-05-24 DIAGNOSIS — C7951 Secondary malignant neoplasm of bone: Secondary | ICD-10-CM | POA: Diagnosis not present

## 2022-05-24 LAB — RAD ONC ARIA SESSION SUMMARY
Course Elapsed Days: 10
Plan Fractions Treated to Date: 9
Plan Fractions Treated to Date: 9
Plan Fractions Treated to Date: 9
Plan Prescribed Dose Per Fraction: 2.5 Gy
Plan Prescribed Dose Per Fraction: 2.5 Gy
Plan Prescribed Dose Per Fraction: 2.5 Gy
Plan Total Fractions Prescribed: 15
Plan Total Fractions Prescribed: 15
Plan Total Fractions Prescribed: 15
Plan Total Prescribed Dose: 37.5 Gy
Plan Total Prescribed Dose: 37.5 Gy
Plan Total Prescribed Dose: 37.5 Gy
Reference Point Dosage Given to Date: 22.5 Gy
Reference Point Dosage Given to Date: 22.5 Gy
Reference Point Dosage Given to Date: 22.5 Gy
Reference Point Session Dosage Given: 2.5 Gy
Reference Point Session Dosage Given: 2.5 Gy
Reference Point Session Dosage Given: 2.5 Gy
Session Number: 9

## 2022-05-27 ENCOUNTER — Other Ambulatory Visit: Payer: Self-pay

## 2022-05-27 ENCOUNTER — Ambulatory Visit
Admission: RE | Admit: 2022-05-27 | Discharge: 2022-05-27 | Disposition: A | Discharge status: Home / Self Care | Payer: Medicare HMO | Source: Ambulatory Visit | Attending: Radiation Oncology | Admitting: Radiation Oncology

## 2022-05-27 DIAGNOSIS — C7951 Secondary malignant neoplasm of bone: Secondary | ICD-10-CM | POA: Diagnosis not present

## 2022-05-27 DIAGNOSIS — C20 Malignant neoplasm of rectum: Secondary | ICD-10-CM | POA: Diagnosis not present

## 2022-05-27 DIAGNOSIS — Z510 Encounter for antineoplastic radiation therapy: Secondary | ICD-10-CM | POA: Diagnosis not present

## 2022-05-27 LAB — RAD ONC ARIA SESSION SUMMARY
Course Elapsed Days: 13
Plan Fractions Treated to Date: 10
Plan Fractions Treated to Date: 10
Plan Fractions Treated to Date: 10
Plan Prescribed Dose Per Fraction: 2.5 Gy
Plan Prescribed Dose Per Fraction: 2.5 Gy
Plan Prescribed Dose Per Fraction: 2.5 Gy
Plan Total Fractions Prescribed: 15
Plan Total Fractions Prescribed: 15
Plan Total Fractions Prescribed: 15
Plan Total Prescribed Dose: 37.5 Gy
Plan Total Prescribed Dose: 37.5 Gy
Plan Total Prescribed Dose: 37.5 Gy
Reference Point Dosage Given to Date: 25 Gy
Reference Point Dosage Given to Date: 25 Gy
Reference Point Dosage Given to Date: 25 Gy
Reference Point Session Dosage Given: 2.5 Gy
Reference Point Session Dosage Given: 2.5 Gy
Reference Point Session Dosage Given: 2.5 Gy
Session Number: 10

## 2022-05-28 ENCOUNTER — Other Ambulatory Visit: Payer: Self-pay

## 2022-05-28 ENCOUNTER — Ambulatory Visit
Admission: RE | Admit: 2022-05-28 | Discharge: 2022-05-28 | Disposition: A | Discharge status: Home / Self Care | Payer: Medicare HMO | Source: Ambulatory Visit | Attending: Radiation Oncology | Admitting: Radiation Oncology

## 2022-05-28 DIAGNOSIS — S42301D Unspecified fracture of shaft of humerus, right arm, subsequent encounter for fracture with routine healing: Secondary | ICD-10-CM | POA: Diagnosis not present

## 2022-05-28 DIAGNOSIS — Z510 Encounter for antineoplastic radiation therapy: Secondary | ICD-10-CM | POA: Diagnosis not present

## 2022-05-28 DIAGNOSIS — C20 Malignant neoplasm of rectum: Secondary | ICD-10-CM | POA: Diagnosis not present

## 2022-05-28 DIAGNOSIS — C7951 Secondary malignant neoplasm of bone: Secondary | ICD-10-CM | POA: Diagnosis not present

## 2022-05-28 LAB — RAD ONC ARIA SESSION SUMMARY
Course Elapsed Days: 14
Plan Fractions Treated to Date: 11
Plan Fractions Treated to Date: 11
Plan Fractions Treated to Date: 11
Plan Prescribed Dose Per Fraction: 2.5 Gy
Plan Prescribed Dose Per Fraction: 2.5 Gy
Plan Prescribed Dose Per Fraction: 2.5 Gy
Plan Total Fractions Prescribed: 15
Plan Total Fractions Prescribed: 15
Plan Total Fractions Prescribed: 15
Plan Total Prescribed Dose: 37.5 Gy
Plan Total Prescribed Dose: 37.5 Gy
Plan Total Prescribed Dose: 37.5 Gy
Reference Point Dosage Given to Date: 27.5 Gy
Reference Point Dosage Given to Date: 27.5 Gy
Reference Point Dosage Given to Date: 27.5 Gy
Reference Point Session Dosage Given: 2.5 Gy
Reference Point Session Dosage Given: 2.5 Gy
Reference Point Session Dosage Given: 2.5 Gy
Session Number: 11

## 2022-05-29 ENCOUNTER — Other Ambulatory Visit: Payer: Self-pay | Admitting: Radiology

## 2022-05-29 ENCOUNTER — Ambulatory Visit
Admission: RE | Admit: 2022-05-29 | Discharge: 2022-05-29 | Disposition: A | Discharge status: Home / Self Care | Payer: Medicare HMO | Source: Ambulatory Visit | Attending: Radiation Oncology | Admitting: Radiation Oncology

## 2022-05-29 ENCOUNTER — Other Ambulatory Visit: Payer: Self-pay

## 2022-05-29 ENCOUNTER — Ambulatory Visit: Payer: Medicare HMO

## 2022-05-29 DIAGNOSIS — B356 Tinea cruris: Secondary | ICD-10-CM

## 2022-05-29 DIAGNOSIS — C20 Malignant neoplasm of rectum: Secondary | ICD-10-CM | POA: Diagnosis not present

## 2022-05-29 DIAGNOSIS — C7951 Secondary malignant neoplasm of bone: Secondary | ICD-10-CM | POA: Diagnosis not present

## 2022-05-29 DIAGNOSIS — Z510 Encounter for antineoplastic radiation therapy: Secondary | ICD-10-CM | POA: Diagnosis not present

## 2022-05-29 LAB — RAD ONC ARIA SESSION SUMMARY
Course Elapsed Days: 15
Plan Fractions Treated to Date: 12
Plan Fractions Treated to Date: 12
Plan Fractions Treated to Date: 12
Plan Prescribed Dose Per Fraction: 2.5 Gy
Plan Prescribed Dose Per Fraction: 2.5 Gy
Plan Prescribed Dose Per Fraction: 2.5 Gy
Plan Total Fractions Prescribed: 15
Plan Total Fractions Prescribed: 15
Plan Total Fractions Prescribed: 15
Plan Total Prescribed Dose: 37.5 Gy
Plan Total Prescribed Dose: 37.5 Gy
Plan Total Prescribed Dose: 37.5 Gy
Reference Point Dosage Given to Date: 30 Gy
Reference Point Dosage Given to Date: 30 Gy
Reference Point Dosage Given to Date: 30 Gy
Reference Point Session Dosage Given: 2.5 Gy
Reference Point Session Dosage Given: 2.5 Gy
Reference Point Session Dosage Given: 2.5 Gy
Session Number: 12

## 2022-05-29 MED ORDER — FLUCONAZOLE 150 MG PO TABS
150.0000 mg | ORAL_TABLET | ORAL | 0 refills | Status: DC
Start: 2022-05-29 — End: 2022-07-31

## 2022-05-29 MED ORDER — FLUCONAZOLE 150 MG PO TABS
150.0000 mg | ORAL_TABLET | ORAL | 0 refills | Status: DC
Start: 2022-05-29 — End: 2022-05-29

## 2022-05-29 NOTE — Progress Notes (Signed)
Patient presented to [name] today with worsening rash in his right groin. This is not likely from the radiation since it is not within the radiation field. He was previously prescribed Nystatin cream with no relief. On physical exam, rash was erythematous with well demarcated borders, consistent with tinea cruris. Prescription for PO Diflucan was sent to his pharmacy today. Patient knows to follow up with PCP if rash does not improve or worsens.     Joyice Faster, PA-C

## 2022-05-30 ENCOUNTER — Ambulatory Visit
Admission: RE | Admit: 2022-05-30 | Discharge: 2022-05-30 | Disposition: A | Discharge status: Home / Self Care | Payer: Medicare HMO | Source: Ambulatory Visit | Attending: Radiation Oncology | Admitting: Radiation Oncology

## 2022-05-30 ENCOUNTER — Other Ambulatory Visit: Payer: Self-pay

## 2022-05-30 DIAGNOSIS — C7951 Secondary malignant neoplasm of bone: Secondary | ICD-10-CM | POA: Diagnosis not present

## 2022-05-30 DIAGNOSIS — C20 Malignant neoplasm of rectum: Secondary | ICD-10-CM | POA: Diagnosis not present

## 2022-05-30 DIAGNOSIS — Z510 Encounter for antineoplastic radiation therapy: Secondary | ICD-10-CM | POA: Diagnosis not present

## 2022-05-30 LAB — RAD ONC ARIA SESSION SUMMARY
Course Elapsed Days: 16
Plan Fractions Treated to Date: 13
Plan Fractions Treated to Date: 13
Plan Fractions Treated to Date: 13
Plan Prescribed Dose Per Fraction: 2.5 Gy
Plan Prescribed Dose Per Fraction: 2.5 Gy
Plan Prescribed Dose Per Fraction: 2.5 Gy
Plan Total Fractions Prescribed: 15
Plan Total Fractions Prescribed: 15
Plan Total Fractions Prescribed: 15
Plan Total Prescribed Dose: 37.5 Gy
Plan Total Prescribed Dose: 37.5 Gy
Plan Total Prescribed Dose: 37.5 Gy
Reference Point Dosage Given to Date: 32.5 Gy
Reference Point Dosage Given to Date: 32.5 Gy
Reference Point Dosage Given to Date: 32.5 Gy
Reference Point Session Dosage Given: 2.5 Gy
Reference Point Session Dosage Given: 2.5 Gy
Reference Point Session Dosage Given: 2.5 Gy
Session Number: 13

## 2022-05-31 ENCOUNTER — Ambulatory Visit
Admission: RE | Admit: 2022-05-31 | Discharge: 2022-05-31 | Disposition: A | Discharge status: Home / Self Care | Payer: Medicare HMO | Source: Ambulatory Visit | Attending: Radiation Oncology | Admitting: Radiation Oncology

## 2022-05-31 ENCOUNTER — Other Ambulatory Visit: Payer: Self-pay

## 2022-05-31 DIAGNOSIS — C20 Malignant neoplasm of rectum: Secondary | ICD-10-CM | POA: Diagnosis not present

## 2022-05-31 DIAGNOSIS — Z510 Encounter for antineoplastic radiation therapy: Secondary | ICD-10-CM | POA: Diagnosis not present

## 2022-05-31 DIAGNOSIS — C7951 Secondary malignant neoplasm of bone: Secondary | ICD-10-CM | POA: Diagnosis not present

## 2022-05-31 LAB — RAD ONC ARIA SESSION SUMMARY
Course Elapsed Days: 17
Plan Fractions Treated to Date: 14
Plan Fractions Treated to Date: 14
Plan Fractions Treated to Date: 14
Plan Prescribed Dose Per Fraction: 2.5 Gy
Plan Prescribed Dose Per Fraction: 2.5 Gy
Plan Prescribed Dose Per Fraction: 2.5 Gy
Plan Total Fractions Prescribed: 15
Plan Total Fractions Prescribed: 15
Plan Total Fractions Prescribed: 15
Plan Total Prescribed Dose: 37.5 Gy
Plan Total Prescribed Dose: 37.5 Gy
Plan Total Prescribed Dose: 37.5 Gy
Reference Point Dosage Given to Date: 35 Gy
Reference Point Dosage Given to Date: 35 Gy
Reference Point Dosage Given to Date: 35 Gy
Reference Point Session Dosage Given: 2.5 Gy
Reference Point Session Dosage Given: 2.5 Gy
Reference Point Session Dosage Given: 2.5 Gy
Session Number: 14

## 2022-06-03 ENCOUNTER — Other Ambulatory Visit: Payer: Self-pay

## 2022-06-03 ENCOUNTER — Ambulatory Visit
Admission: RE | Admit: 2022-06-03 | Discharge: 2022-06-03 | Disposition: A | Discharge status: Home / Self Care | Payer: Medicare HMO | Source: Ambulatory Visit | Attending: Radiation Oncology | Admitting: Radiation Oncology

## 2022-06-03 DIAGNOSIS — Z95828 Presence of other vascular implants and grafts: Secondary | ICD-10-CM

## 2022-06-03 DIAGNOSIS — Z510 Encounter for antineoplastic radiation therapy: Secondary | ICD-10-CM | POA: Diagnosis not present

## 2022-06-03 DIAGNOSIS — C20 Malignant neoplasm of rectum: Secondary | ICD-10-CM | POA: Diagnosis not present

## 2022-06-03 DIAGNOSIS — C7951 Secondary malignant neoplasm of bone: Secondary | ICD-10-CM | POA: Diagnosis not present

## 2022-06-03 LAB — RAD ONC ARIA SESSION SUMMARY
Course Elapsed Days: 20
Plan Fractions Treated to Date: 15
Plan Fractions Treated to Date: 15
Plan Fractions Treated to Date: 15
Plan Prescribed Dose Per Fraction: 2.5 Gy
Plan Prescribed Dose Per Fraction: 2.5 Gy
Plan Prescribed Dose Per Fraction: 2.5 Gy
Plan Total Fractions Prescribed: 15
Plan Total Fractions Prescribed: 15
Plan Total Fractions Prescribed: 15
Plan Total Prescribed Dose: 37.5 Gy
Plan Total Prescribed Dose: 37.5 Gy
Plan Total Prescribed Dose: 37.5 Gy
Reference Point Dosage Given to Date: 37.5 Gy
Reference Point Dosage Given to Date: 37.5 Gy
Reference Point Dosage Given to Date: 37.5 Gy
Reference Point Session Dosage Given: 2.5 Gy
Reference Point Session Dosage Given: 2.5 Gy
Reference Point Session Dosage Given: 2.5 Gy
Session Number: 15

## 2022-06-03 NOTE — Progress Notes (Signed)
Pharmacist Chemotherapy Monitoring - Initial Assessment    Anticipated start date: 06/04/22   The following has been reviewed per standard work regarding the patient's treatment regimen: The patient's diagnosis, treatment plan and drug doses, and organ/hematologic function Lab orders and baseline tests specific to treatment regimen  The treatment plan start date, drug sequencing, and pre-medications Prior authorization status  Patient's documented medication list, including drug-drug interaction screen and prescriptions for anti-emetics and supportive care specific to the treatment regimen The drug concentrations, fluid compatibility, administration routes, and timing of the medications to be used The patient's access for treatment and lifetime cumulative dose history, if applicable  The patient's medication allergies and previous infusion related reactions, if applicable   Changes made to treatment plan:  N/A  Follow up needed:  N/A Patient transfer from [facility]   Stephens Shire, [facility], 06/03/2022  12:20 PM

## 2022-06-04 ENCOUNTER — Inpatient Hospital Stay: Payer: Medicare HMO | Admitting: Hematology

## 2022-06-04 ENCOUNTER — Encounter (HOSPITAL_COMMUNITY): Payer: Self-pay

## 2022-06-04 ENCOUNTER — Other Ambulatory Visit: Payer: Self-pay

## 2022-06-04 ENCOUNTER — Emergency Department (HOSPITAL_COMMUNITY): Payer: Medicare HMO

## 2022-06-04 ENCOUNTER — Inpatient Hospital Stay: Payer: Medicare HMO

## 2022-06-04 ENCOUNTER — Emergency Department (HOSPITAL_COMMUNITY)
Admission: EM | Admit: 2022-06-04 | Discharge: 2022-06-04 | Disposition: A | Discharge status: Home / Self Care | Payer: Medicare HMO | Attending: Emergency Medicine | Admitting: Emergency Medicine

## 2022-06-04 DIAGNOSIS — M79661 Pain in right lower leg: Secondary | ICD-10-CM | POA: Insufficient documentation

## 2022-06-04 DIAGNOSIS — M79604 Pain in right leg: Secondary | ICD-10-CM

## 2022-06-04 NOTE — ED Triage Notes (Signed)
Pt comes in with complaints of rt hip to leg pain "shooting & nagging." Pt states hx of lt hip surgery with similar pains and was told it was sciatica pain. Pt states they rx gabapentin and it helped but last night the medication did not help.

## 2022-06-04 NOTE — ED Notes (Signed)
Patient transported to X-ray 

## 2022-06-04 NOTE — ED Provider Notes (Signed)
[place] [facility] AT [facility] Provider Note   CSN: [id number] Arrival date & time: 06/04/22  1331     History {Add pertinent medical, surgical, social history, OB history to HPI:1} Chief Complaint  Patient presents with   Leg Pain    Joseph Hogan is a 62 y.o. male.  Patient has renal cancer.  He complains of pain in his right lower leg.   Leg Pain      Home Medications Prior to Admission medications   Medication Sig Start Date End Date Taking? Authorizing Provider  b complex vitamins capsule Take 1 capsule by mouth daily.    [provider]  fluconazole (DIFLUCAN) 150 MG tablet Take 1 tablet (150 mg total) by mouth once a week. 05/29/22   Erven Colla, PA-C  HYDROmorphone (DILAUDID) 2 MG tablet Take 1 tablet (2 mg total) by mouth every 4 (four) hours as needed for severe pain. 05/21/22   [name], MD  lidocaine-prilocaine (EMLA) cream Apply 1 Application topically as needed. 04/08/22   Briant Cedar, PA-C  methocarbamol (ROBAXIN) 500 MG tablet Take 1 tablet (500 mg total) by mouth every 8 (eight) hours as needed for muscle spasms. 04/12/22   Vernetta Honey, PA-C  Multiple Vitamin (MULTIVITAMIN WITH MINERALS) TABS tablet Take 1 tablet by mouth daily.    [provider]  nystatin-triamcinolone ointment (MYCOLOG) Apply 1 Application topically 3 (three) times daily. Do not apply 4 hours prior to Radiation treatment 05/14/22   [name], MD  ondansetron (ZOFRAN) 8 MG tablet Take 11.5 tablets (92 mg total) by mouth every 8 (eight) hours as needed for nausea or vomiting. Patient taking differently: Take 8-16 mg by mouth every 8 (eight) hours as needed for nausea or vomiting. 03/16/22   Briant Cedar, PA-C  oxyCODONE ER (XTAMPZA ER) 13.5 MG C12A Take 13.5 mg by mouth every 8 (eight) hours as needed. 04/25/22   Briant Cedar, PA-C  prochlorperazine (COMPAZINE) 10 MG tablet Take 1 tablet (10 mg total) by mouth every 6 (six)  hours as needed for nausea or vomiting. 03/16/22   Briant Cedar, PA-C  VITAMIN A PO Take 1 tablet by mouth daily.    [provider]      Allergies    Codeine and Lisinopril    Review of Systems   Review of Systems  Physical Exam Updated Vital Signs BP 127/77 (BP Location: Right Arm)   Pulse 84   Temp 99.7 F (37.6 C) (Oral)   Resp 16   Ht  (1.803 m)   Wt 94.3 kg   SpO2 100%   BMI 29.01 kg/m  Physical Exam  ED Results / Procedures / Treatments   Labs (all labs ordered are listed, but only abnormal results are displayed) Labs Reviewed - No data to display  EKG None  Radiology DG Hip Unilat W or Wo Pelvis 2-3 Views Right  Result Date: 06/04/2022 CLINICAL DATA:  Right leg pain without known injury. History of rectal cancer. EXAM: DG HIP (WITH OR WITHOUT PELVIS) 2-3V RIGHT COMPARISON:  PET scan of May 02, 2022. FINDINGS: There is no evidence of hip fracture or dislocation. There is no evidence of arthropathy. Status post left total hip arthroplasty. Large lytic expansile lesion is noted involving the left superior pubic ramus consistent with metastatic disease. IMPRESSION: Right hip is unremarkable. Status post left total hip arthroplasty. Expansile lytic lesion is noted in left superior pubic ramus  consistent with osseous metastases. Electronically Signed   By: Lupita Raider M.D.   On: 06/04/2022 18:14   US Venous Img Lower Unilateral Right (DVT)  Result Date: 06/04/2022 CLINICAL DATA:  Right lower extremity pain EXAM: RIGHT LOWER EXTREMITY VENOUS DOPPLER ULTRASOUND TECHNIQUE: Gray-scale sonography with compression, as well as color and duplex ultrasound, were performed to evaluate the deep venous system(s) from the level of the common femoral vein through the popliteal and proximal calf veins. COMPARISON:  None Available. FINDINGS: VENOUS Normal compressibility of the common femoral, superficial femoral, and popliteal veins, as well as the visualized calf  veins. Visualized portions of profunda femoral vein and great saphenous vein unremarkable. No filling defects to suggest DVT on grayscale or color Doppler imaging. Doppler waveforms show normal direction of venous flow, normal respiratory plasticity and response to augmentation. Limited views of the contralateral common femoral vein are unremarkable. OTHER None. Limitations: none IMPRESSION: Negative for DVT in the right lower extremity. Electronically Signed   By: Wiliam Ke M.D.   On: 06/04/2022 17:19    Procedures Procedures  {Document cardiac monitor, telemetry assessment procedure when appropriate:1}  Medications Ordered in ED Medications - No data to display  ED Course/ Medical Decision Making/ A&P   {   Click here for ABCD2, HEART and other calculatorsREFRESH Note before signing :1}                          Medical Decision Making Amount and/or Complexity of Data Reviewed Radiology: ordered.   Right lower leg had a negative DVT and old lytic lesion to left pelvis.  Patient with musculoskeletal pain over the right lower extremity.  He will follow-up with his orthopedic  {Document critical care time when appropriate:1} {Document review of labs and clinical decision tools ie heart score, Chads2Vasc2 etc:1}  {Document your independent review of radiology images, and any outside records:1} {Document your discussion with family members, caretakers, and with consultants:1} {Document social determinants of health affecting pt's care:1} {Document your decision making why or why not admission, treatments were needed:1} Final Clinical Impression(s) / ED Diagnoses Final diagnoses:  Right leg pain    Rx / DC Orders ED Discharge Orders     None

## 2022-06-04 NOTE — Discharge Instructions (Signed)
Follow up with your ortho md next week for recheck.

## 2022-06-05 NOTE — Radiation Completion Notes (Signed)
  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      ==========DELIVERED PLANS==========  First Treatment Date: 2022-05-14 - Last Treatment Date: 2022-06-03   Plan Name: Ext_R Site: Humerus, Right Technique: Isodose Plan Mode: Photon Dose Per Fraction: 2.5 Gy Prescribed Dose (Delivered / Prescribed): 37.5 Gy / 37.5 Gy Prescribed Fxs (Delivered / Prescribed): 15 / 15   Plan Name: Spine_L3 Site: Lumbar Spine Technique: 3D Mode: Photon Dose Per Fraction: 2.5 Gy Prescribed Dose (Delivered / Prescribed): 37.5 Gy / 37.5 Gy Prescribed Fxs (Delivered / Prescribed): 15 / 15   Plan Name: Pelvis_L Site: Pelvis Technique: Isodose Plan Mode: Photon Dose Per Fraction: 2.5 Gy Prescribed Dose (Delivered / Prescribed): 37.5 Gy / 37.5 Gy Prescribed Fxs (Delivered / Prescribed): 15 / 15     ==========ON TREATMENT VISIT DATES========== 2022-05-17, 2022-05-24, 2022-05-31   See weekly On Treatment Notes is Epic for details. The patient tolerated radiation. He developed fatigue and anticipated skin changes in the treatment field.   The patient will receive a call in about one month from the radiation oncology department. He will continue follow up with Dr. Leonides Schanz as well.      Osker Mason, PAC

## 2022-06-06 ENCOUNTER — Telehealth: Payer: Self-pay | Admitting: *Deleted

## 2022-06-06 ENCOUNTER — Other Ambulatory Visit: Payer: Self-pay

## 2022-06-06 ENCOUNTER — Inpatient Hospital Stay: Payer: Medicare HMO

## 2022-06-06 MED ORDER — GABAPENTIN 300 MG PO CAPS: 300.0000 mg | ORAL_CAPSULE | Freq: Every day | ORAL | 1 refills | Status: DC

## 2022-06-06 NOTE — Telephone Encounter (Signed)
Received call from pt. He states he went to the ED for leg pain yesterday. He states pain was in the back of his right lower leg. Doppler negative for DVT. Hip XRAY negative for for any any changes.  He states he took a left over gabapentin with some relief but only has 4 left . Advised to call his ortho provider as well  Dr. Leonides Schanz made aware and is ok to refill gabapentin. Pt made aware.

## 2022-06-07 ENCOUNTER — Telehealth: Payer: Self-pay

## 2022-06-07 NOTE — Transitions of Care (Post Inpatient/ED Visit) (Signed)
   06/07/2022  Name: Joseph Hogan MRN: [id number] DOB: 02/02/1961  Today's TOC FU Call Status: Today's TOC FU Call Status:: Unsuccessul Call (1st Attempt) Unsuccessful Call (1st Attempt) Date: 06/07/22  Red on EMMI-ED Discharge Alert Date & Reason: 06/06/22 "Scheduled follow-up appt? No"  Attempted to reach the patient regarding the most recent Inpatient/ED visit.  Follow Up Plan: Additional outreach attempts will be made to reach the patient to complete the Transitions of Care (Post Inpatient/ED visit) call.     Antionette Fairy, RN,BSN,CCM [facility] Care Management Care Management Community Coordinator Direct Phone: [phone] Toll Free: [phone] Fax: [phone]

## 2022-06-10 ENCOUNTER — Telehealth: Payer: Self-pay

## 2022-06-10 NOTE — Transitions of Care (Post Inpatient/ED Visit) (Signed)
   06/10/2022  Name: Joseph Hogan MRN: [id number] DOB: 1960-03-20  Today's TOC FU Call Status: Today's TOC FU Call Status:: Successful TOC FU Call Competed TOC FU Call Complete Date: 06/10/22  Red on EMMI-ED Discharge Alert Date & Reason:06/06/22 "Scheduled follow-up appt? No"  Transition Care Management Follow-up Telephone Call Date of Discharge: 06/04/22 [name] Type of Discharge: Emergency Department Reason for ED Visit: Other: ("right leg pain") How have you been since you were released from the hospital?: Better (Pt states he is doing "much better-his doctors started him on Gabapentin and it is a miracle drug." He voices sxs have been managed/controlled and he only has an occassional pain/discomfort to leg.) Any questions or concerns?: No  Items Reviewed: Did you receive and understand the discharge instructions provided?: Yes Medications obtained and verified?: Yes (Medications Reviewed) Any new allergies since your discharge?: No Dietary orders reviewed?: Yes Type of Diet Ordered:: low salt/heart healthy Do you have support at home?: Yes People in Home: child(ren), adult Name of Support/Comfort Primary Source: daughter  Home Care and Equipment/Supplies: Were Home Health Services Ordered?: NA Any new equipment or medical supplies ordered?: NA  Functional Questionnaire: Do you need assistance with bathing/showering or dressing?: No Do you need assistance with meal preparation?: No Do you need assistance with eating?: No Do you have difficulty maintaining continence: No Do you need assistance with getting out of bed/getting out of a chair/moving?: No Do you have difficulty managing or taking your medications?: No  Follow up appointments reviewed: PCP Follow-up appointment confirmed?: NA (pt states he is followed by oncologist for all medical care) Specialist Hospital Follow-up appointment confirmed?: Yes Date of Specialist follow-up  appointment?: 06/18/22 Follow-Up Specialty Provider:: Dr. Ellin Saba Do you need transportation to your follow-up appointment?: No (pt states his daughter will take him to appts) Do you understand care options if your condition(s) worsen?: Yes-patient verbalized understanding  SDOH Interventions Today    Flowsheet Row Most Recent Value  SDOH Interventions   Food Insecurity Interventions Intervention Not Indicated  Transportation Interventions Intervention Not Indicated       TOC Interventions Today    Flowsheet Row Most Recent Value  TOC Interventions   TOC Interventions Discussed/Reviewed TOC Interventions Discussed      Interventions Today    Flowsheet Row Most Recent Value  General Interventions   General Interventions Discussed/Reviewed General Interventions Discussed, Doctor Visits  Doctor Visits Discussed/Reviewed Doctor Visits Discussed, PCP  PCP/Specialist Visits Compliance with follow-up visit  Education Interventions   Education Provided Provided Education  Provided Verbal Education On Medication, When to see the doctor  Pharmacy Interventions   Pharmacy Dicussed/Reviewed Pharmacy Topics Discussed, Medications and their functions  Safety Interventions   Safety Discussed/Reviewed Safety Discussed        Joseph Hogan [facility] Care Management Care Management Community Coordinator Direct Phone: [phone] Toll Free: [phone] Fax: [phone]

## 2022-06-10 NOTE — Transitions of Care (Post Inpatient/ED Visit) (Signed)
   06/10/2022  Name: Joseph Hogan MRN: [id number] DOB: May 28, 1960  Today's TOC FU Call Status: Today's TOC FU Call Status:: Unsuccessful Call (2nd Attempt) Unsuccessful Call (2nd Attempt) Date: 06/10/22   Red on EMMI-ED Discharge Alert Date & Reason:06/06/22 "Scheduled follow-up appt? No"  Attempted to reach the patient regarding the most recent Inpatient/ED visit.  Follow Up Plan: Additional outreach attempts will be made to reach the patient to complete the Transitions of Care (Post Inpatient/ED visit) call.     Antionette Fairy, RN,BSN,CCM [facility] Care Management Care Management Community Coordinator Direct Phone: [phone] Toll Free: [phone] Fax: [phone]

## 2022-06-12 DIAGNOSIS — C189 Malignant neoplasm of colon, unspecified: Secondary | ICD-10-CM | POA: Diagnosis not present

## 2022-06-12 DIAGNOSIS — M84521D Pathological fracture in neoplastic disease, right humerus, subsequent encounter for fracture with routine healing: Secondary | ICD-10-CM | POA: Diagnosis not present

## 2022-06-12 DIAGNOSIS — M1990 Unspecified osteoarthritis, unspecified site: Secondary | ICD-10-CM | POA: Diagnosis not present

## 2022-06-12 DIAGNOSIS — C7951 Secondary malignant neoplasm of bone: Secondary | ICD-10-CM | POA: Diagnosis not present

## 2022-06-12 DIAGNOSIS — Z9181 History of falling: Secondary | ICD-10-CM | POA: Diagnosis not present

## 2022-06-14 ENCOUNTER — Telehealth: Payer: Self-pay | Admitting: *Deleted

## 2022-06-14 ENCOUNTER — Other Ambulatory Visit: Payer: Self-pay | Admitting: Physician Assistant

## 2022-06-14 MED ORDER — METHOCARBAMOL 500 MG PO TABS: 500.0000 mg | ORAL_TABLET | Freq: Three times a day (TID) | ORAL | 0 refills | Status: DC | PRN

## 2022-06-14 NOTE — Telephone Encounter (Signed)
Notified that robaxin was sent

## 2022-06-14 NOTE — Telephone Encounter (Signed)
Clever was not able to get Robaxin from Orthopedic.They wanted to schedule him an appt next week. He has chemo next week. Can we fill this for him?

## 2022-06-14 NOTE — Telephone Encounter (Signed)
Joseph Hogan states he is having the pain and spasms again in his buttocks and back of thigh. It is so bad, it wakes me up. Is currently taking gabapentin and has tried pain med without relief.Is requesting a refill of Robaxin as that helped spasms in the past. Encouraged him to call his orthopedic surgeon as Dr Leonides Schanz was not the prescriber of the robaxin.

## 2022-06-17 ENCOUNTER — Inpatient Hospital Stay: Payer: Medicare HMO

## 2022-06-17 ENCOUNTER — Other Ambulatory Visit: Payer: Self-pay

## 2022-06-17 VITALS — BP 133/78 | HR 85 | Temp 98.6°F | Resp 18

## 2022-06-17 DIAGNOSIS — C20 Malignant neoplasm of rectum: Secondary | ICD-10-CM | POA: Insufficient documentation

## 2022-06-17 DIAGNOSIS — R42 Dizziness and giddiness: Secondary | ICD-10-CM | POA: Insufficient documentation

## 2022-06-17 DIAGNOSIS — Z5111 Encounter for antineoplastic chemotherapy: Secondary | ICD-10-CM | POA: Diagnosis not present

## 2022-06-17 DIAGNOSIS — Z5112 Encounter for antineoplastic immunotherapy: Secondary | ICD-10-CM | POA: Diagnosis not present

## 2022-06-17 DIAGNOSIS — C7951 Secondary malignant neoplasm of bone: Secondary | ICD-10-CM | POA: Insufficient documentation

## 2022-06-17 DIAGNOSIS — C779 Secondary and unspecified malignant neoplasm of lymph node, unspecified: Secondary | ICD-10-CM | POA: Insufficient documentation

## 2022-06-17 DIAGNOSIS — D649 Anemia, unspecified: Secondary | ICD-10-CM | POA: Diagnosis not present

## 2022-06-17 DIAGNOSIS — M25569 Pain in unspecified knee: Secondary | ICD-10-CM | POA: Insufficient documentation

## 2022-06-17 DIAGNOSIS — Z95828 Presence of other vascular implants and grafts: Secondary | ICD-10-CM

## 2022-06-17 DIAGNOSIS — M25551 Pain in right hip: Secondary | ICD-10-CM | POA: Insufficient documentation

## 2022-06-17 DIAGNOSIS — G35 Multiple sclerosis: Secondary | ICD-10-CM | POA: Diagnosis not present

## 2022-06-17 DIAGNOSIS — Z79899 Other long term (current) drug therapy: Secondary | ICD-10-CM | POA: Diagnosis not present

## 2022-06-17 LAB — COMPREHENSIVE METABOLIC PANEL
ALT: 19 U/L (ref 0–44)
AST: 21 U/L (ref 15–41)
Albumin: 2.7 g/dL — ABNORMAL LOW (ref 3.5–5.0)
Alkaline Phosphatase: 82 U/L (ref 38–126)
Anion gap: 10 (ref 5–15)
BUN: 13 mg/dL (ref 8–23)
CO2: 24 mmol/L (ref 22–32)
Calcium: 8.6 mg/dL — ABNORMAL LOW (ref 8.9–10.3)
Chloride: 98 mmol/L (ref 98–111)
Creatinine, Ser: 0.58 mg/dL — ABNORMAL LOW (ref 0.61–1.24)
GFR, Estimated: >60 mL/min (ref >60)
Glucose, Bld: 168 mg/dL — ABNORMAL HIGH (ref 70–99)
Potassium: 3.6 mmol/L (ref 3.5–5.1)
Sodium: 132 mmol/L — ABNORMAL LOW (ref 135–145)
Total Bilirubin: 0.6 mg/dL (ref 0.3–1.2)
Total Protein: 7.2 g/dL (ref 6.5–8.1)

## 2022-06-17 LAB — CBC WITH DIFFERENTIAL/PLATELET
Abs Immature Granulocytes: 0.03 10*3/uL (ref 0.00–0.07)
Basophils Absolute: 0 10*3/uL (ref 0.0–0.1)
Basophils Relative: 1 %
Eosinophils Absolute: 0.3 10*3/uL (ref 0.0–0.5)
Eosinophils Relative: 7 %
HCT: 31.8 % — ABNORMAL LOW (ref 39.0–52.0)
Hemoglobin: 10.2 g/dL — ABNORMAL LOW (ref 13.0–17.0)
Immature Granulocytes: 1 %
Lymphocytes Relative: 7 %
Lymphs Abs: 0.3 10*3/uL — ABNORMAL LOW (ref 0.7–4.0)
MCH: 27.9 pg (ref 26.0–34.0)
MCHC: 32.1 g/dL (ref 30.0–36.0)
MCV: 86.9 fL (ref 80.0–100.0)
Monocytes Absolute: 0.5 10*3/uL (ref 0.1–1.0)
Monocytes Relative: 11 %
Neutro Abs: 3 10*3/uL (ref 1.7–7.7)
Neutrophils Relative %: 73 %
Platelets: 380 10*3/uL (ref 150–400)
RBC: 3.66 MIL/uL — ABNORMAL LOW (ref 4.22–5.81)
RDW: 16.9 % — ABNORMAL HIGH (ref 11.5–15.5)
WBC: 4.1 10*3/uL (ref 4.0–10.5)
nRBC: 0 % (ref 0.0–0.2)

## 2022-06-17 LAB — MAGNESIUM: Magnesium: 1.9 mg/dL (ref 1.7–2.4)

## 2022-06-17 MED ORDER — HEPARIN SOD (PORK) LOCK FLUSH 100 UNIT/ML IV SOLN
500.0000 [IU] | Freq: Once | INTRAVENOUS | Status: AC
  Administered 2022-06-17: 500 [IU] via INTRAVENOUS

## 2022-06-17 MED ORDER — SODIUM CHLORIDE 0.9% FLUSH
10.0000 mL | Freq: Once | INTRAVENOUS | Status: AC
  Administered 2022-06-17: 10 mL via INTRAVENOUS

## 2022-06-17 NOTE — Progress Notes (Incomplete)
[facility] [address], [place] [address]   Clinic Day:  06/17/2022  Referring physician: [name], MD  Patient Care Team: [name], MD as PCP - General (Hematology and Oncology)   ASSESSMENT & PLAN:   Assessment: ***  Plan: ***  No orders of the defined types were placed in this encounter.     I,Katie Daubenspeck,acting as a Neurosurgeon for Doreatha Massed, MD.,have documented all relevant documentation on the behalf of Doreatha Massed, MD,as directed by  Doreatha Massed, MD while in the presence of Doreatha Massed, MD.   ***  Katie Daubenspeck   5/6/20249:15 PM  CHIEF COMPLAINT/PURPOSE OF CONSULT:   Diagnosis: metastatic rectal adenocarcinoma  Cancer Staging  Rectal carcinoma [facility]) Staging form: Colon and Rectum, AJCC 8th Edition - Clinical stage from 03/19/2022: Stage IVB (cTX, cN2b, cM1b) - Signed by [name], MD on 05/21/2022    Prior Therapy: ***  Current Therapy:  ***   HISTORY OF PRESENT ILLNESS:   Oncology History  Rectal carcinoma (HCC)  03/19/2022 Initial Diagnosis   Rectal carcinoma (HCC)   03/19/2022 Cancer Staging   Staging form: Colon and Rectum, AJCC 8th Edition - Clinical stage from 03/19/2022: Stage IVB (cTX, cN2b, cM1b) - Signed by [name], MD on 05/21/2022 Stage prefix: Initial diagnosis   04/08/2022 -  Chemotherapy   Patient is on Treatment Plan : COLORECTAL FOLFOX + Bevacizumab q14d         Conard is a 61 y.o. male presenting to clinic today for evaluation of metastatic rectal adenocarcinoma at the request of Dr. Leonides Schanz. He is transferring his care here.  Today, he states that he is doing well overall. His appetite level is at ***%. His energy level is at ***%.  PAST MEDICAL HISTORY:   Past Medical History: Past Medical History:  Diagnosis Date  . Arthritis   . Hypertension   . Iron deficiency anemia   . 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   with abnormal  R wave progression noted on EKG  . MRSA (methicillin resistant Staphylococcus aureus)   . Pre-diabetes   . Rectal carcinoma (HCC) 03/19/2022    Surgical History: Past Surgical History:  Procedure Laterality Date  . COLONOSCOPY    . HUMERUS IM NAIL Right 04/12/2022   Procedure: INTRAMEDULLARY (IM) NAIL HUMERAL;  Surgeon: Bjorn Pippin, MD;  Location: [facility];  Service: Orthopedics;  Laterality: Right;  . IR IMAGING GUIDED PORT INSERTION  04/05/2022  . TOTAL HIP ARTHROPLASTY Left 02/06/2022   Procedure: TOTAL HIP ARTHROPLASTY;  Surgeon: Joen Laura, MD;  Location: [facility];  Service: Orthopedics;  Laterality: Left;    Social History: Social History   Socioeconomic History  . Marital status: Divorced    Spouse name: Not on file  . Number of children: Not on file  . Years of education: Not on file  . Highest education level: Not on file  Occupational History  . Not on file  Tobacco Use  . Smoking status: Never  . Smokeless tobacco: Never  Vaping Use  . Vaping Use: Never used  Substance and Sexual Activity  . Alcohol use: No  . Drug use: No  . Sexual activity: Not on file  Other Topics Concern  . Not on file  Social History Narrative  . Not on file   Social Determinants of Health   Financial Resource Strain: Not on file  Food Insecurity: No Food Insecurity (06/10/2022)  Hunger Vital Sign   . Worried About Programme researcher, broadcasting/film/video in the Last Year: Never true   . Ran Out of Food in the Last Year: Never true  Transportation Needs: No Transportation Needs (06/10/2022)   PRAPARE - Transportation   . Lack of Transportation (Medical): No   . Lack of Transportation (Non-Medical): No  Physical Activity: Not on file  Stress: Not on file  Social Connections: Not on file  Intimate Partner Violence: Not At Risk (02/04/2022)   Humiliation, Afraid, Rape, and Kick questionnaire   . Fear of Current or Ex-Partner: No   . Emotionally Abused: No   . Physically Abused: No   .  Sexually Abused: No    Family History: Family History  Problem Relation Age of Onset  . Stroke Mother   . Hypertension Mother   . Heart disease Father   . Alzheimer's disease Father   . Hypertension Brother   . Alcohol abuse Brother   . Cancer Maternal Aunt   . Heart disease Maternal Aunt   . Cancer Maternal Uncle   . Heart disease Maternal Uncle   . Cancer Paternal Aunt   . Heart disease Paternal Aunt   . Cancer Paternal Uncle   . Heart disease Paternal Uncle   . Heart disease Paternal Grandmother   . Alzheimer's disease Paternal Grandfather   . Stomach cancer Neg Hx   . Rectal cancer Neg Hx   . Esophageal cancer Neg Hx   . Colon cancer Neg Hx     Current Medications:  Current Outpatient Medications:  .  b complex vitamins capsule, Take 1 capsule by mouth daily., Disp: , Rfl:  .  fluconazole (DIFLUCAN) 150 MG tablet, Take 1 tablet (150 mg total) by mouth once a week., Disp: 4 tablet, Rfl: 0 .  gabapentin (NEURONTIN) 300 MG capsule, Take 1 capsule (300 mg total) by mouth at bedtime., Disp: 30 capsule, Rfl: 1 .  HYDROmorphone (DILAUDID) 2 MG tablet, Take 1 tablet (2 mg total) by mouth every 4 (four) hours as needed for severe pain., Disp: 90 tablet, Rfl: 0 .  lidocaine-prilocaine (EMLA) cream, Apply 1 Application topically as needed., Disp: 30 g, Rfl: 0 .  methocarbamol (ROBAXIN) 500 MG tablet, Take 1 tablet (500 mg total) by mouth every 8 (eight) hours as needed for muscle spasms., Disp: 30 tablet, Rfl: 0 .  Multiple Vitamin (MULTIVITAMIN WITH MINERALS) TABS tablet, Take 1 tablet by mouth daily., Disp: , Rfl:  .  nystatin-triamcinolone ointment (MYCOLOG), Apply 1 Application topically 3 (three) times daily. Do not apply 4 hours prior to Radiation treatment, Disp: 30 g, Rfl: 0 .  ondansetron (ZOFRAN) 8 MG tablet, Take 11.5 tablets (92 mg total) by mouth every 8 (eight) hours as needed for nausea or vomiting. (Patient taking differently: Take 8-16 mg by mouth every 8 (eight)  hours as needed for nausea or vomiting.), Disp: 90 tablet, Rfl: 0 .  oxyCODONE ER (XTAMPZA ER) 13.5 MG C12A, Take 13.5 mg by mouth every 8 (eight) hours as needed., Disp: 90 capsule, Rfl: 0 .  prochlorperazine (COMPAZINE) 10 MG tablet, Take 1 tablet (10 mg total) by mouth every 6 (six) hours as needed for nausea or vomiting., Disp: 90 tablet, Rfl: 0 .  VITAMIN A PO, Take 1 tablet by mouth daily., Disp: , Rfl:    Allergies: Allergies  Allergen Reactions  . Codeine Nausea And Vomiting  . Lisinopril Cough    REVIEW OF SYSTEMS:   Review of Systems - Oncology  VITALS:   There were no vitals taken for this visit.  Wt Readings from Last 3 Encounters:  06/04/22 208 lb (94.3 kg)  05/21/22 208 lb 14.4 oz (94.8 kg)  05/07/22 205 lb (93 kg)    There is no height or weight on file to calculate BMI.  Performance status (ECOG): {CHL ONC [id number]  PHYSICAL EXAM:   Physical Exam  LABS:      Latest Ref Rng & Units 06/17/2022   10:37 AM 05/21/2022    9:26 AM 05/07/2022    7:59 AM  CBC  WBC 4.0 - 10.5 K/uL 4.1  4.0  6.5   Hemoglobin 13.0 - 17.0 g/dL 16.1  09.6  04.5   Hematocrit 39.0 - 52.0 % 31.8  33.5  34.0   Platelets 150 - 400 K/uL 380  181  260       Latest Ref Rng & Units 06/17/2022   10:37 AM 05/21/2022    9:26 AM 05/07/2022    7:59 AM  CMP  Glucose 70 - 99 mg/dL 409  811  914   BUN 8 - 23 mg/dL 13  11  16    Creatinine 0.61 - 1.24 mg/dL 7.82  9.56  2.13   Sodium 135 - 145 mmol/L 132  141  137   Potassium 3.5 - 5.1 mmol/L 3.6  4.0  4.3   Chloride 98 - 111 mmol/L 98  107  102   CO2 22 - 32 mmol/L 24  25  26    Calcium 8.9 - 10.3 mg/dL 8.6  9.0  9.2   Total Protein 6.5 - 8.1 g/dL 7.2  6.8  7.3   Total Bilirubin 0.3 - 1.2 mg/dL 0.6  0.5  0.6   Alkaline Phos 38 - 126 U/L 82  154  237   AST 15 - 41 U/L 21  18  22    ALT 0 - 44 U/L 19  9  11       Lab Results  Component Value Date   CEA 11.17 (H) 03/15/2022   /  CEA ([facility])  Date Value Ref Range Status  03/15/2022 11.17  (H) 0.00 - 5.00 ng/mL Final    Comment:    (NOTE) This test was performed using [employer] Coulter's paramagnetic chemiluminescent immunoassay. Values obtained from different assay methods cannot be used interchangeably. Please note that up to 8% of patients who smoke may see values 5.1-10.0 ng/ml and 1% of patients who smoke may see CEA levels >10.0 ng/ml. Performed at [facility], [address] [place], [place] [address]    Lab Results  Component Value Date   PSA1 1.8 02/04/2022   No results found for: "QIO962" No results found for: "XBM841"  Lab Results  Component Value Date   TOTALPROTELP 6.6 02/06/2022   ALBUMINELP 3.0 02/04/2022   A1GS 0.4 02/04/2022   A2GS 1.0 02/04/2022   BETS 0.9 02/04/2022   GAMS 1.1 02/04/2022   MSPIKE Not Observed 02/04/2022   SPEI Comment 02/04/2022   Lab Results  Component Value Date   TIBC 197 (L) 04/08/2022   FERRITIN 580 (H) 04/08/2022   IRONPCTSAT 9 (L) 04/08/2022   No results found for: "LDH"   STUDIES:   DG Hip Unilat W or Wo Pelvis 2-3 Views Right  Result Date: 06/04/2022 CLINICAL DATA:  Right leg pain without known injury. History of rectal cancer. EXAM: DG HIP (WITH OR WITHOUT PELVIS) 2-3V RIGHT COMPARISON:  PET scan of May 02, 2022. FINDINGS: There is no evidence of  hip fracture or dislocation. There is no evidence of arthropathy. Status post left total hip arthroplasty. Large lytic expansile lesion is noted involving the left superior pubic ramus consistent with metastatic disease. IMPRESSION: Right hip is unremarkable. Status post left total hip arthroplasty. Expansile lytic lesion is noted in left superior pubic ramus consistent with osseous metastases. Electronically Signed   By: Lupita Raider M.D.   On: 06/04/2022 18:14   US Venous Img Lower Unilateral Right (DVT)  Result Date: 06/04/2022 CLINICAL DATA:  Right lower extremity pain EXAM: RIGHT LOWER EXTREMITY VENOUS DOPPLER ULTRASOUND TECHNIQUE:  Gray-scale sonography with compression, as well as color and duplex ultrasound, were performed to evaluate the deep venous system(s) from the level of the common femoral vein through the popliteal and proximal calf veins. COMPARISON:  None Available. FINDINGS: VENOUS Normal compressibility of the common femoral, superficial femoral, and popliteal veins, as well as the visualized calf veins. Visualized portions of profunda femoral vein and great saphenous vein unremarkable. No filling defects to suggest DVT on grayscale or color Doppler imaging. Doppler waveforms show normal direction of venous flow, normal respiratory plasticity and response to augmentation. Limited views of the contralateral common femoral vein are unremarkable. OTHER None. Limitations: none IMPRESSION: Negative for DVT in the right lower extremity. Electronically Signed   By: Wiliam Ke M.D.   On: 06/04/2022 17:19

## 2022-06-17 NOTE — Progress Notes (Signed)
[facility] [address] [place] [address]   Clinic Day:  06/18/2022  Referring physician: [name], MD  Patient Care Team: Doreatha Massed, MD as Medical Oncologist (Medical Oncology)   ASSESSMENT & PLAN:   Assessment:  1.  Metastatic rectal cancer to the bones and lymph nodes: - S/p ORIF of proximal right humerus (04/12/2022) and left femoral neck for pathological fractures (02/06/2021) after presentation to the ER due to left hip pain after mechanical fall. - Pathology (02/06/2022): Left femoral head resection consistent with poorly differentiated carcinoma. - Colonoscopy (02/25/2022): Infiltrative, polypoid and ulcerated nonobstructing medium-sized mass found in the rectum, measuring 4 cm in length, not circumferential. - Pathology: Rectal biopsy-poorly differentiated carcinoma. - PET scan (05/02/2022): Mildly hypermetabolic retroperitoneal, pelvic and inguinal lymph nodes.  Single small hypermetabolic left supraclavicular lymph node.  Nonspecific low-level metabolic activity in the presacral/pelvic soft tissues.  Bone metastasis-right lateral mass of C1, L2 spinous process, upper sacrum, both proximal humeri, around the right sacroiliac joint, multiple ribs, throughout the bony pelvis and proximal left femur. - NGS (foundation 1): KRAS/NRAS wild-type, APC L914, CDK 8 amplification-equivocal, T p53.  MS-stable. - FOLFOX and bevacizumab from 04/08/2022  2.  Social/family history: - Lives at home by himself.  He needs walker for ambulation.  He has bad knees from arthritis.  He has 3 children who live close by and check on him daily.  He did Radio broadcast assistant.  Non-smoker. - No family history of malignancies.  Plan:  1.  Metastatic rectal cancer to the bones and lymph nodes, MS-stable: - He has completed 4 cycles of FOLFOX and bevacizumab. - He had cold sensitivity and tingling or numbness in the fingertips and toes lasting 3 to 4 days after each  cycle. - Reviewed labs today: Normal LFTs.  CBC was grossly normal with normocytic anemia.  Last CEA was 11.17. - We talked about the normal prognosis of metastatic colorectal cancer with treatment intent being palliative.  I have recommended continuing chemotherapy for 2 more cycles followed by his scans. - He will proceed with his treatment today.  RTC 2 weeks for follow-up.  2.  Knee pain/right shoulder/right hip pain: - Continue gabapentin 300 mg at bedtime which is helping right hip pain. - He wants to wean himself off of the pain medication.  I told him to start taking oxycodone ER 1 tablet daily and slowly taper it off.  In the interim, he will continue Dilaudid 2 mg 3 times daily as needed. - He reported dizziness since methocarbamol was started. - We will discontinue methocarbamol.  Will start him on Flexeril 5 mg as needed.  3.  Normocytic anemia: - Last Venofer infusion on 05/23/2022. - Hemoglobin today is 10.2.  Ferritin is 1096 and percent saturation 12.  Closely monitor.   Orders Placed This Encounter  Procedures   Iron and TIBC ([facility] ONLY)    Standing Status:   Future    Number of Occurrences:   1    Standing Expiration Date:   06/18/2023   Ferritin    Standing Status:   Future    Number of Occurrences:   1    Standing Expiration Date:   06/18/2023      I,Katie Daubenspeck,acting as a scribe for Doreatha Massed, MD.,have documented all relevant documentation on the behalf of Doreatha Massed, MD,as directed by  Doreatha Massed, MD while in the presence of Doreatha Massed, MD.   I, Doreatha Massed MD, have  reviewed the above documentation for accuracy and completeness, and I agree with the above.   Doreatha Massed, MD   5/7/20246:01 PM  CHIEF COMPLAINT/PURPOSE OF CONSULT:   Diagnosis: metastatic rectal adenocarcinoma   Cancer Staging  Rectal carcinoma [facility]) Staging form: Colon and Rectum, AJCC 8th Edition - Clinical  stage from 03/19/2022: Stage IVB (cTX, cN2b, cM1b) - Signed by [name], MD on 05/21/2022    Prior Therapy: postop/palliative radiation to left hip, right humerus, and L3 05/14/22 - 06/03/22  Current Therapy:  FOLFOX + bevacizumab   HISTORY OF PRESENT ILLNESS:   Oncology History  Rectal carcinoma (HCC)  03/19/2022 Initial Diagnosis   Rectal carcinoma (HCC)   03/19/2022 Cancer Staging   Staging form: Colon and Rectum, AJCC 8th Edition - Clinical stage from 03/19/2022: Stage IVB (cTX, cN2b, cM1b) - Signed by [name], MD on 05/21/2022 Stage prefix: Initial diagnosis   04/08/2022 -  Chemotherapy   Patient is on Treatment Plan : COLORECTAL FOLFOX + Bevacizumab q14d         Joseph Hogan is a 62 y.o. male presenting to clinic today for evaluation of metastatic rectal adenocarcinoma at the request of Dr. Leonides Schanz. He is transferring his care here.  In summary, he presented to the ED on 02/03/22 with left hip pain following a fall. CT of the left hip revealed an acute pathologic fracture with underlying lytic lesions in femoral head and neck. Additional lytic lesions were seen were seen in the pelvis, as well as a prominent left external iliac lymph nodes. Further evaluation with a CT C/A/P showed: retroperitoneal and bilateral pelvic adenopathy and mildly enlarged left supraclavicular lymph node; lytic destructive lesions of pubic ramus with pathologic fractures of left femoral neck and in right 2nd rib and deformity in right 3rd rib; 10 cm rectal wall thickening; nonspecific 1.1 cm lesion of left mid kidney. He was evaluated by Dr. Leonides Schanz on 02/05/22. He was taken for left hip arthroplasty on 02/06/22 under Dr. Blanchie Dessert. Pathology from the femoral head confirmed poorly differentiated carcinoma.  After discharge on 02/12/22, he underwent colonoscopy and EGD on 02/25/22 under Dr. Lavon Paganini. Procedure showed a non-obstructing 4 cm malignant tumor in the rectum, and pathology confirmed poorly  differentiated carcinoma.  He had port placed on 04/05/22 and began FOLFOX on 04/08/22. Bevacizumab was added with cycle 2 on 04/23/22.  He was also found to have a destructive lesion in the proximal right humerus with associated pathologic fracture on shoulder x-ray on 04/08/22, which was performed for shoulder pain. He was taken for intramedullary nail of the right humerus on 04/12/22 under Dr. Everardo Pacific. Following this, he was referred to Dr. Mitzi Hansen in radiation oncology on 04/24/22 to discuss postoperative radiation to the humerus and palliative radiation to the left hip.   He underwent PET scan on 05/02/22 to evaluate for any other areas concerning for pathologic fracture. This showed: widespread hypermetabolic osseous metastatic disease involving C1, L2, upper sacrum, both proximal humeri, right sacroiliac joint, multiple ribs, throughout bony pelvis, and proximal left humerus. He subsequently received radiation treatment to the right humerus, L3, and the left pelvis from 05/14/22 through 06/03/22.  Today, he states that he is doing well overall. His appetite level is at 100%. His energy level is at 40%.  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  with abnormal R wave progression noted on EKG   MRSA (methicillin resistant Staphylococcus aureus)    Pre-diabetes    Rectal carcinoma (HCC) 03/19/2022    Surgical History: Past Surgical History:  Procedure Laterality Date   COLONOSCOPY     HUMERUS IM NAIL Right 04/12/2022   Procedure: INTRAMEDULLARY (IM) NAIL HUMERAL;  Surgeon: Bjorn Pippin, MD;  Location: [facility];  Service: Orthopedics;  Laterality: Right;   IR IMAGING GUIDED PORT INSERTION  04/05/2022   TOTAL HIP ARTHROPLASTY Left 02/06/2022   Procedure: TOTAL HIP ARTHROPLASTY;  Surgeon: Joen Laura, MD;  Location: [facility];  Service: Orthopedics;   Laterality: Left;    Social History: Social History   Socioeconomic History   Marital status: Divorced    Spouse name: Not on file   Number of children: Not on file   Years of education: Not on file   Highest education level: Not on file  Occupational History   Not on file  Tobacco Use   Smoking status: Never   Smokeless tobacco: Never  Vaping Use   Vaping Use: Never used  Substance and Sexual Activity   Alcohol use: No   Drug use: No   Sexual activity: Not on file  Other Topics Concern   Not on file  Social History Narrative   Not on file   Social Determinants of Health   Financial Resource Strain: Not on file  Food Insecurity: No Food Insecurity (06/10/2022)   Hunger Vital Sign    Worried About Running Out of Food in the Last Year: Never true    Ran Out of Food in the Last Year: Never true  Transportation Needs: No Transportation Needs (06/10/2022)   PRAPARE - Administrator, Civil Service (Medical): No    Lack of Transportation (Non-Medical): No  Physical Activity: Not on file  Stress: Not on file  Social Connections: Not on file  Intimate Partner Violence: Not At Risk (02/04/2022)   Humiliation, Afraid, Rape, and Kick questionnaire    Fear of Current or Ex-Partner: No    Emotionally Abused: No    Physically Abused: No    Sexually Abused: No    Family History: Family History  Problem Relation Age of Onset   Stroke Mother    Hypertension Mother    Heart disease Father    Alzheimer's disease Father    Hypertension Brother    Alcohol abuse Brother    Cancer Maternal Aunt    Heart disease Maternal Aunt    Cancer Maternal Uncle    Heart disease Maternal Uncle    Cancer Paternal Aunt    Heart disease Paternal Aunt    Cancer Paternal Uncle    Heart disease Paternal Uncle    Heart disease Paternal Grandmother    Alzheimer's disease Paternal Grandfather    Stomach cancer Neg Hx    Rectal cancer Neg Hx    Esophageal cancer Neg Hx    Colon  cancer Neg Hx     Current Medications:  Current Outpatient Medications:    b complex vitamins capsule, Take 1 capsule by mouth daily., Disp: , Rfl:    cyclobenzaprine (FLEXERIL) 5 MG tablet, Take 1 tablet (5 mg total) by mouth 3 (three) times daily as needed for muscle spasms., Disp: 30 tablet, Rfl: 0   fluconazole (DIFLUCAN) 150 MG tablet, Take 1 tablet (150 mg total) by mouth once a week., Disp: 4 tablet, Rfl: 0   gabapentin (NEURONTIN) 300 MG capsule, Take 1  capsule (300 mg total) by mouth at bedtime., Disp: 30 capsule, Rfl: 1   HYDROmorphone (DILAUDID) 2 MG tablet, Take 1 tablet (2 mg total) by mouth every 4 (four) hours as needed for severe pain., Disp: 90 tablet, Rfl: 0   methocarbamol (ROBAXIN) 500 MG tablet, Take 1 tablet (500 mg total) by mouth every 8 (eight) hours as needed for muscle spasms. (Patient not taking: Reported on 06/18/2022), Disp: 30 tablet, Rfl: 0   Multiple Vitamin (MULTIVITAMIN WITH MINERALS) TABS tablet, Take 1 tablet by mouth daily., Disp: , Rfl:    ondansetron (ZOFRAN) 8 MG tablet, Take 11.5 tablets (92 mg total) by mouth every 8 (eight) hours as needed for nausea or vomiting. (Patient taking differently: Take 8-16 mg by mouth every 8 (eight) hours as needed for nausea or vomiting.), Disp: 90 tablet, Rfl: 0   oxyCODONE ER (XTAMPZA ER) 13.5 MG C12A, Take 13.5 mg by mouth every 8 (eight) hours as needed., Disp: 90 capsule, Rfl: 0   prochlorperazine (COMPAZINE) 10 MG tablet, Take 1 tablet (10 mg total) by mouth every 6 (six) hours as needed for nausea or vomiting., Disp: 90 tablet, Rfl: 0   VITAMIN A PO, Take 1 tablet by mouth daily., Disp: , Rfl:  No current facility-administered medications for this visit.  Facility-Administered Medications Ordered in Other Visits:    fluorouracil (ADRUCIL) 5,000 mg in sodium chloride 0.9 % 150 mL chemo infusion, 2,400 mg/m2 (Treatment Plan Recorded), Intravenous, 1 day or 1 dose, Doreatha Massed, MD, Infusion Verify at 06/18/22  1427   Allergies: Allergies  Allergen Reactions   Codeine Nausea And Vomiting   Lisinopril Cough    REVIEW OF SYSTEMS:   Review of Systems  Constitutional:  Negative for chills, fatigue and fever.  HENT:   Negative for lump/mass, mouth sores, nosebleeds, sore throat and trouble swallowing.   Eyes:  Negative for eye problems.  Respiratory:  Negative for cough and shortness of breath.   Cardiovascular:  Negative for chest pain, leg swelling and palpitations.  Gastrointestinal:  Negative for abdominal pain, constipation, diarrhea, nausea and vomiting.  Genitourinary:  Negative for bladder incontinence, difficulty urinating, dysuria, frequency, hematuria and nocturia.   Musculoskeletal:  Negative for arthralgias, back pain, flank pain, myalgias and neck pain.  Skin:  Negative for itching and rash.  Neurological:  Positive for dizziness. Negative for headaches and numbness.  Hematological:  Does not bruise/bleed easily.  Psychiatric/Behavioral:  Negative for depression, sleep disturbance and suicidal ideas. The patient is not nervous/anxious.   All other systems reviewed and are negative.    VITALS:   Blood pressure (!) 146/86, pulse 84, temperature 98.3 F (36.8 C), temperature source Tympanic, resp. rate 18, height 5\' 11"  (1.803 m), weight 200 lb (90.7 kg), SpO2 99 %.  Wt Readings from Last 3 Encounters:  06/18/22 200 lb (90.7 kg)  06/04/22 208 lb (94.3 kg)  05/21/22 208 lb 14.4 oz (94.8 kg)    Body mass index is 27.89 kg/m.  Performance status (ECOG): 1 - Symptomatic but completely ambulatory  PHYSICAL EXAM:   Physical Exam Vitals and nursing note reviewed. Exam conducted with a chaperone present.  Constitutional:      Appearance: Normal appearance.  Cardiovascular:     Rate and Rhythm: Normal rate and regular rhythm.     Pulses: Normal pulses.     Heart sounds: Normal heart sounds.  Pulmonary:     Effort: Pulmonary effort is normal.     Breath sounds: Normal breath  sounds.  Abdominal:     Palpations: Abdomen is soft. There is no hepatomegaly, splenomegaly or mass.     Tenderness: There is no abdominal tenderness.  Musculoskeletal:     Right lower leg: No edema.     Left lower leg: No edema.  Lymphadenopathy:     Cervical: No cervical adenopathy.     Right cervical: No superficial, deep or posterior cervical adenopathy.    Left cervical: No superficial, deep or posterior cervical adenopathy.     Upper Body:     Right upper body: No supraclavicular or axillary adenopathy.     Left upper body: No supraclavicular or axillary adenopathy.  Neurological:     General: No focal deficit present.     Mental Status: He is alert and oriented to person, place, and time.  Psychiatric:        Mood and Affect: Mood normal.        Behavior: Behavior normal.     LABS:      Latest Ref Rng & Units 06/17/2022   10:37 AM 05/21/2022    9:26 AM 05/07/2022    7:59 AM  CBC  WBC 4.0 - 10.5 K/uL 4.1  4.0  6.5   Hemoglobin 13.0 - 17.0 g/dL 16.1  09.6  04.5   Hematocrit 39.0 - 52.0 % 31.8  33.5  34.0   Platelets 150 - 400 K/uL 380  181  260       Latest Ref Rng & Units 06/17/2022   10:37 AM 05/21/2022    9:26 AM 05/07/2022    7:59 AM  CMP  Glucose 70 - 99 mg/dL 409  811  914   BUN 8 - 23 mg/dL 13  11  16    Creatinine 0.61 - 1.24 mg/dL 7.82  9.56  2.13   Sodium 135 - 145 mmol/L 132  141  137   Potassium 3.5 - 5.1 mmol/L 3.6  4.0  4.3   Chloride 98 - 111 mmol/L 98  107  102   CO2 22 - 32 mmol/L 24  25  26    Calcium 8.9 - 10.3 mg/dL 8.6  9.0  9.2   Total Protein 6.5 - 8.1 g/dL 7.2  6.8  7.3   Total Bilirubin 0.3 - 1.2 mg/dL 0.6  0.5  0.6   Alkaline Phos 38 - 126 U/L 82  154  237   AST 15 - 41 U/L 21  18  22    ALT 0 - 44 U/L 19  9  11       Lab Results  Component Value Date   CEA 11.17 (H) 03/15/2022   /  CEA ([facility])  Date Value Ref Range Status  03/15/2022 11.17 (H) 0.00 - 5.00 ng/mL Final    Comment:    (NOTE) This test was performed using Beckman  Coulter's paramagnetic chemiluminescent immunoassay. Values obtained from different assay methods cannot be used interchangeably. Please note that up to 8% of patients who smoke may see values 5.1-10.0 ng/ml and 1% of patients who smoke may see CEA levels >10.0 ng/ml. Performed at [facility], [address], [place], [place] [address]    Lab Results  Component Value Date   PSA1 1.8 02/04/2022   No results found for: "QIO962" No results found for: "CAN125"  Lab Results  Component Value Date   TOTALPROTELP 6.6 02/06/2022   ALBUMINELP 3.0 02/04/2022   A1GS 0.4 02/04/2022   A2GS 1.0 02/04/2022   BETS 0.9 02/04/2022   GAMS 1.1 02/04/2022  MSPIKE Not Observed 02/04/2022   SPEI Comment 02/04/2022   Lab Results  Component Value Date   TIBC 196 (L) 06/18/2022   TIBC 197 (L) 04/08/2022   FERRITIN 1,096 (H) 06/18/2022   FERRITIN 580 (H) 04/08/2022   IRONPCTSAT 12 (L) 06/18/2022   IRONPCTSAT 9 (L) 04/08/2022   No results found for: "LDH"   STUDIES:   DG Hip Unilat W or Wo Pelvis 2-3 Views Right  Result Date: 06/04/2022 CLINICAL DATA:  Right leg pain without known injury. History of rectal cancer. EXAM: DG HIP (WITH OR WITHOUT PELVIS) 2-3V RIGHT COMPARISON:  PET scan of May 02, 2022. FINDINGS: There is no evidence of hip fracture or dislocation. There is no evidence of arthropathy. Status post left total hip arthroplasty. Large lytic expansile lesion is noted involving the left superior pubic ramus consistent with metastatic disease. IMPRESSION: Right hip is unremarkable. Status post left total hip arthroplasty. Expansile lytic lesion is noted in left superior pubic ramus consistent with osseous metastases. Electronically Signed   By: Lupita Raider M.D.   On: 06/04/2022 18:14   US Venous Img Lower Unilateral Right (DVT)  Result Date: 06/04/2022 CLINICAL DATA:  Right lower extremity pain EXAM: RIGHT LOWER EXTREMITY VENOUS DOPPLER ULTRASOUND TECHNIQUE:  Gray-scale sonography with compression, as well as color and duplex ultrasound, were performed to evaluate the deep venous system(s) from the level of the common femoral vein through the popliteal and proximal calf veins. COMPARISON:  None Available. FINDINGS: VENOUS Normal compressibility of the common femoral, superficial femoral, and popliteal veins, as well as the visualized calf veins. Visualized portions of profunda femoral vein and great saphenous vein unremarkable. No filling defects to suggest DVT on grayscale or color Doppler imaging. Doppler waveforms show normal direction of venous flow, normal respiratory plasticity and response to augmentation. Limited views of the contralateral common femoral vein are unremarkable. OTHER None. Limitations: none IMPRESSION: Negative for DVT in the right lower extremity. Electronically Signed   By: Wiliam Ke M.D.   On: 06/04/2022 17:19

## 2022-06-18 ENCOUNTER — Inpatient Hospital Stay: Payer: Medicare HMO

## 2022-06-18 ENCOUNTER — Inpatient Hospital Stay: Payer: Medicare HMO | Attending: Physician Assistant | Admitting: Hematology

## 2022-06-18 ENCOUNTER — Encounter: Payer: Self-pay | Admitting: Hematology and Oncology

## 2022-06-18 VITALS — BP 133/72 | HR 68 | Temp 98.1°F | Resp 17

## 2022-06-18 VITALS — BP 146/86 | HR 84 | Temp 98.3°F | Resp 18 | Ht 71.0 in | Wt 200.0 lb

## 2022-06-18 DIAGNOSIS — C20 Malignant neoplasm of rectum: Secondary | ICD-10-CM

## 2022-06-18 DIAGNOSIS — M25551 Pain in right hip: Secondary | ICD-10-CM | POA: Diagnosis not present

## 2022-06-18 DIAGNOSIS — D649 Anemia, unspecified: Secondary | ICD-10-CM | POA: Diagnosis not present

## 2022-06-18 DIAGNOSIS — Z5111 Encounter for antineoplastic chemotherapy: Secondary | ICD-10-CM | POA: Diagnosis not present

## 2022-06-18 DIAGNOSIS — C7951 Secondary malignant neoplasm of bone: Secondary | ICD-10-CM | POA: Diagnosis not present

## 2022-06-18 DIAGNOSIS — G35 Multiple sclerosis: Secondary | ICD-10-CM | POA: Diagnosis not present

## 2022-06-18 DIAGNOSIS — Z5112 Encounter for antineoplastic immunotherapy: Secondary | ICD-10-CM | POA: Diagnosis not present

## 2022-06-18 DIAGNOSIS — C779 Secondary and unspecified malignant neoplasm of lymph node, unspecified: Secondary | ICD-10-CM | POA: Diagnosis not present

## 2022-06-18 DIAGNOSIS — M25569 Pain in unspecified knee: Secondary | ICD-10-CM | POA: Diagnosis not present

## 2022-06-18 LAB — URINALYSIS, DIPSTICK ONLY
Bilirubin Urine: NEGATIVE
Glucose, UA: NEGATIVE mg/dL
Hgb urine dipstick: NEGATIVE
Ketones, ur: NEGATIVE mg/dL
Leukocytes,Ua: NEGATIVE
Nitrite: NEGATIVE
Protein, ur: NEGATIVE mg/dL
Specific Gravity, Urine: 1.012 (ref 1.005–1.030)
pH: 5 (ref 5.0–8.0)

## 2022-06-18 LAB — FERRITIN: Ferritin: 1096 ng/mL — ABNORMAL HIGH (ref 24–336)

## 2022-06-18 LAB — IRON AND TIBC
Iron: 24 ug/dL — ABNORMAL LOW (ref 45–182)
Saturation Ratios: 12 % — ABNORMAL LOW (ref 17.9–39.5)
TIBC: 196 ug/dL — ABNORMAL LOW (ref 250–450)
UIBC: 172 ug/dL

## 2022-06-18 MED ORDER — CYCLOBENZAPRINE HCL 5 MG PO TABS: 5.0000 mg | ORAL_TABLET | Freq: Three times a day (TID) | ORAL | 0 refills | Status: DC | PRN

## 2022-06-18 MED ORDER — PALONOSETRON HCL INJECTION 0.25 MG/5ML
0.2500 mg | Freq: Once | INTRAVENOUS | Status: AC
  Administered 2022-06-18: 0.25 mg via INTRAVENOUS
  Filled 2022-06-18: qty 5

## 2022-06-18 MED ORDER — DEXTROSE 5 % IV SOLN: Freq: Once | INTRAVENOUS | Status: AC

## 2022-06-18 MED ORDER — SODIUM CHLORIDE 0.9 % IV SOLN: Freq: Once | INTRAVENOUS | Status: AC

## 2022-06-18 MED ORDER — SODIUM CHLORIDE 0.9 % IV SOLN
2400.0000 mg/m2 | INTRAVENOUS | Status: DC
  Administered 2022-06-18: 5000 mg via INTRAVENOUS
  Filled 2022-06-18: qty 100

## 2022-06-18 MED ORDER — SODIUM CHLORIDE 0.9 % IV SOLN
5.0000 mg/kg | Freq: Once | INTRAVENOUS | Status: AC
  Administered 2022-06-18: 500 mg via INTRAVENOUS
  Filled 2022-06-18: qty 16

## 2022-06-18 MED ORDER — SODIUM CHLORIDE 0.9 % IV SOLN
10.0000 mg | Freq: Once | INTRAVENOUS | Status: AC
  Administered 2022-06-18: 10 mg via INTRAVENOUS
  Filled 2022-06-18: qty 1

## 2022-06-18 MED ORDER — OXALIPLATIN CHEMO INJECTION 100 MG/20ML
85.0000 mg/m2 | Freq: Once | INTRAVENOUS | Status: AC
  Administered 2022-06-18: 200 mg via INTRAVENOUS
  Filled 2022-06-18: qty 40

## 2022-06-18 MED ORDER — FLUOROURACIL CHEMO INJECTION 2.5 GM/50ML
400.0000 mg/m2 | Freq: Once | INTRAVENOUS | Status: AC
  Administered 2022-06-18: 900 mg via INTRAVENOUS
  Filled 2022-06-18: qty 18

## 2022-06-18 MED ORDER — LEUCOVORIN CALCIUM INJECTION 350 MG
400.0000 mg/m2 | Freq: Once | INTRAVENOUS | Status: AC
  Administered 2022-06-18: 876 mg via INTRAVENOUS
  Filled 2022-06-18: qty 43.8

## 2022-06-18 NOTE — Patient Instructions (Signed)
[facility]  Discharge Instructions  You were seen and examined today by Dr. Ellin Saba. Dr. Ellin Saba is a medical oncologist, meaning that he specializes in the treatment of cancer diagnoses. Dr. Ellin Saba discussed your past medical history, family history of cancers, and the events that led to you being here today.  You were referred to Dr. Ellin Saba for ongoing management of your metastatic rectal cancer.  Proceed with treatment today as planned.  Dr. Ellin Saba will order additional testing on your biopsy.  Follow-up as scheduled.  Thank you for choosing [facility] to provide your oncology and hematology care.   To afford each patient quality time with our provider, please arrive at least 15 minutes before your scheduled appointment time. You may need to reschedule your appointment if you arrive late (10 or more minutes). Arriving late affects you and other patients whose appointments are after yours.  Also, if you miss three or more appointments without notifying the office, you may be dismissed from the clinic at the provider's discretion.    Again, thank you for choosing [facility].  Our hope is that these requests will decrease the amount of time that you wait before being seen by our physicians.   If you have a lab appointment with the Cancer Center - please note that after April 8th, all labs will be drawn in the cancer center.  You do not have to check in or register with the main entrance as you have in the past but will complete your check-in at the cancer center.            _____________________________________________________________  Should you have questions after your visit to [facility], please contact our office at [phone] and follow the prompts.  Our office hours are 8:00 a.m. to 4:30 p.m. Monday - Thursday and 8:00 a.m. to 2:30 p.m. Friday.  Please note that voicemails left  after 4:00 p.m. may not be returned until the following business day.  We are closed weekends and all major holidays.  You do have access to a nurse 24-7, just call the main number to the clinic [phone] and do not press any options, hold on the line and a nurse will answer the phone.    For prescription refill requests, have your pharmacy contact our office and allow 72 hours.    Masks are no longer required in the cancer centers. If you would like for your care team to wear a mask while they are taking care of you, please let them know. You may have one support person who is at least 64 years old accompany you for your appointments.

## 2022-06-18 NOTE — Progress Notes (Signed)
Patient has been assessed, vital signs and labs have been reviewed by Dr. Katragadda. ANC, Creatinine, LFTs, and Platelets are within treatment parameters per Dr. Katragadda. The patient is good to proceed with treatment at this time. Primary RN and pharmacy aware.  

## 2022-06-18 NOTE — Progress Notes (Signed)
Patient presents today for chemotherapy/immunotherapy infusion, MVASI, leucovorin, and fluorouracil push with 5FU pump. Patient is in satisfactory condition with no new complaints voiced.  Vital signs are stable. Labs reviewed by Dr. Ellin Saba during the office visit and all labs are within treatment parameters. Urine obtained prior to treatment. We will proceed with treatment per MD orders.   Patient tolerated treatment well with no complaints voiced. 5FU pump connected with no difficulties. Patient left via wheelchair with family.  Vital signs stable at discharge.  Follow up as scheduled.

## 2022-06-18 NOTE — Patient Instructions (Signed)
[facility]  Discharge Instructions: Thank you for choosing [facility] to provide your oncology and hematology care.  If you have a lab appointment with the Cancer Center - please note that after April 8th, 2024, all labs will be drawn in the cancer center.  You do not have to check in or register with the main entrance as you have in the past but will complete your check-in in the cancer center.  Wear comfortable clothing and clothing appropriate for easy access to any Portacath or PICC line.   We strive to give you quality time with your provider. You may need to reschedule your appointment if you arrive late (15 or more minutes).  Arriving late affects you and other patients whose appointments are after yours.  Also, if you miss three or more appointments without notifying the office, you may be dismissed from the clinic at the provider's discretion.      For prescription refill requests, have your pharmacy contact our office and allow 72 hours for refills to be completed.    Today you received the following chemotherapy and/or immunotherapy agents MVASI, Oxaliplatin, leucoFluorouracil Injection What is this medication? FLUOROURACIL (flure oh YOOR a sil) treats some types of cancer. It works by slowing down the growth of cancer cells. This medicine may be used for other purposes; ask your health care provider or pharmacist if you have questions. COMMON BRAND NAME(S): Adrucil What should I tell my care team before I take this medication? They need to know if you have any of these conditions: Blood disorders Dihydropyrimidine dehydrogenase (DPD) deficiency Infection, such as chickenpox, cold sores, herpes Kidney disease Liver disease Poor nutrition Recent or ongoing radiation therapy An unusual or allergic reaction to fluorouracil, other medications, foods, dyes, or preservatives If you or your partner are pregnant or trying to get  pregnant Breast-feeding How should I use this medication? This medication is injected into a vein. It is administered by your care team in a hospital or clinic setting. Talk to your care team about the use of this medication in children. Special care may be needed. Overdosage: If you think you have taken too much of this medicine contact a poison control center or emergency room at once. NOTE: This medicine is only for you. Do not share this medicine with others. What if I miss a dose? Keep appointments for follow-up doses. It is important not to miss your dose. Call your care team if you are unable to keep an appointment. What may interact with this medication? Do not take this medication with any of the following: Live virus vaccines This medication may also interact with the following: Medications that treat or prevent blood clots, such as warfarin, enoxaparin, dalteparin This list may not describe all possible interactions. Give your health care provider a list of all the medicines, herbs, non-prescription drugs, or dietary supplements you use. Also tell them if you smoke, drink alcohol, or use illegal drugs. Some items may interact with your medicine. What should I watch for while using this medication? Your condition will be monitored carefully while you are receiving this medication. This medication may make you feel generally unwell. This is not uncommon as chemotherapy can affect healthy cells as well as cancer cells. Report any side effects. Continue your course of treatment even though you feel ill unless your care team tells you to stop. In some cases, you may be given additional medications to help with side effects. Follow all  directions for their use. This medication may increase your risk of getting an infection. Call your care team for advice if you get a fever, chills, sore throat, or other symptoms of a cold or flu. Do not treat yourself. Try to avoid being around people who are  sick. This medication may increase your risk to bruise or bleed. Call your care team if you notice any unusual bleeding. Be careful brushing or flossing your teeth or using a toothpick because you may get an infection or bleed more easily. If you have any dental work done, tell your dentist you are receiving this medication. Avoid taking medications that contain aspirin, acetaminophen, ibuprofen, naproxen, or ketoprofen unless instructed by your care team. These medications may hide a fever. Do not treat diarrhea with over the counter products. Contact your care team if you have diarrhea that lasts more than 2 days or if it is severe and watery. This medication can make you more sensitive to the sun. Keep out of the sun. If you cannot avoid being in the sun, wear protective clothing and sunscreen. Do not use sun lamps, tanning beds, or tanning booths. Talk to your care team if you or your partner wish to become pregnant or think you might be pregnant. This medication can cause serious birth defects if taken during pregnancy and for 3 months after the last dose. A reliable form of contraception is recommended while taking this medication and for 3 months after the last dose. Talk to your care team about effective forms of contraception. Do not father a child while taking this medication and for 3 months after the last dose. Use a condom while having sex during this time period. Do not breastfeed while taking this medication. This medication may cause infertility. Talk to your care team if you are concerned about your fertility. What side effects may I notice from receiving this medication? Side effects that you should report to your care team as soon as possible: Allergic reactions--skin rash, itching, hives, swelling of the face, lips, tongue, or throat Heart attack--pain or tightness in the chest, shoulders, arms, or jaw, nausea, shortness of breath, cold or clammy skin, feeling faint or  lightheaded Heart failure--shortness of breath, swelling of the ankles, feet, or hands, sudden weight gain, unusual weakness or fatigue Heart rhythm changes--fast or irregular heartbeat, dizziness, feeling faint or lightheaded, chest pain, trouble breathing High ammonia level--unusual weakness or fatigue, confusion, loss of appetite, nausea, vomiting, seizures Infection--fever, chills, cough, sore throat, wounds that don't heal, pain or trouble when passing urine, general feeling of discomfort or being unwell Low red blood cell level--unusual weakness or fatigue, dizziness, headache, trouble breathing Pain, tingling, or numbness in the hands or feet, muscle weakness, change in vision, confusion or trouble speaking, loss of balance or coordination, trouble walking, seizures Redness, swelling, and blistering of the skin over hands and feet Severe or prolonged diarrhea Unusual bruising or bleeding Side effects that usually do not require medical attention (report to your care team if they continue or are bothersome): Dry skin Headache Increased tears Nausea Pain, redness, or swelling with sores inside the mouth or throat Sensitivity to light Vomiting This list may not describe all possible side effects. Call your doctor for medical advice about side effects. You may report side effects to FDA at 1-800-FDA-1088. Where should I keep my medication? This medication is given in a hospital or clinic. It will not be stored at home. NOTE: This sheet is a summary. It may  not cover all possible information. If you have questions about this medicine, talk to your doctor, pharmacist, or health care provider.  2023 Elsevier/Gold Standard (2021-05-29 00:00:00) vorin, 5FU.   Leucovorin Injection What is this medication? LEUCOVORIN (loo koe VOR in) prevents side effects from certain medications, such as methotrexate. It works by increasing folate levels. This helps protect healthy cells in your body. It  may also be used to treat anemia caused by low levels of folate. It can also be used with fluorouracil, a type of chemotherapy, to treat colorectal cancer. It works by increasing the effects of fluorouracil in the body. This medicine may be used for other purposes; ask your health care provider or pharmacist if you have questions. What should I tell my care team before I take this medication? They need to know if you have any of these conditions: Anemia from low levels of vitamin B12 in the blood An unusual or allergic reaction to leucovorin, folic acid, other medications, foods, dyes, or preservatives Pregnant or trying to get pregnant Breastfeeding How should I use this medication? This medication is injected into a vein or a muscle. It is given by your care team in a hospital or clinic setting. Talk to your care team about the use of this medication in children. Special care may be needed. Overdosage: If you think you have taken too much of this medicine contact a poison control center or emergency room at once. NOTE: This medicine is only for you. Do not share this medicine with others. What if I miss a dose? Keep appointments for follow-up doses. It is important not to miss your dose. Call your care team if you are unable to keep an appointment. What may interact with this medication? Capecitabine Fluorouracil Phenobarbital Phenytoin Primidone Trimethoprim;sulfamethoxazole This list may not describe all possible interactions. Give your health care provider a list of all the medicines, herbs, non-prescription drugs, or dietary supplements you use. Also tell them if you smoke, drink alcohol, or use illegal drugs. Some items may interact with your medicine. What should I watch for while using this medication? Your condition will be monitored carefully while you are receiving this medication. This medication may increase the side effects of 5-fluorouracil. Tell your care team if you have  diarrhea or mouth sores that do not get better or that get worse. What side effects may I notice from receiving this medication? Side effects that you should report to your care team as soon as possible: Allergic reactions--skin rash, itching, hives, swelling of the face, lips, tongue, or throat This list may not describe all possible side effects. Call your doctor for medical advice about side effects. You may report side effects to FDA at 1-800-FDA-1088. Where should I keep my medication? This medication is given in a hospital or clinic. It will not be stored at home. NOTE: This sheet is a summary. It may not cover all possible information. If you have questions about this medicine, talk to your doctor, pharmacist, or health care provider.  2023 Elsevier/Gold Standard (2021-06-08 00:00:00) Oxaliplatin Injection What is this medication? OXALIPLATIN (ox AL i PLA tin) treats colorectal cancer. It works by slowing down the growth of cancer cells. This medicine may be used for other purposes; ask your health care provider or pharmacist if you have questions. COMMON BRAND NAME(S): Eloxatin What should I tell my care team before I take this medication? They need to know if you have any of these conditions: Heart disease History of irregular  heartbeat or rhythm Liver disease Low blood cell levels (white cells, red cells, and platelets) Lung or breathing disease, such as asthma Take medications that treat or prevent blood clots Tingling of the fingers, toes, or other nerve disorder An unusual or allergic reaction to oxaliplatin, other medications, foods, dyes, or preservatives If you or your partner are pregnant or trying to get pregnant Breast-feeding How should I use this medication? This medication is injected into a vein. It is given by your care team in a hospital or clinic setting. Talk to your care team about the use of this medication in children. Special care may be  needed. Overdosage: If you think you have taken too much of this medicine contact a poison control center or emergency room at once. NOTE: This medicine is only for you. Do not share this medicine with others. What if I miss a dose? Keep appointments for follow-up doses. It is important not to miss a dose. Call your care team if you are unable to keep an appointment. What may interact with this medication? Do not take this medication with any of the following: Cisapride Dronedarone Pimozide Thioridazine This medication may also interact with the following: Aspirin and aspirin-like medications Certain medications that treat or prevent blood clots, such as warfarin, apixaban, dabigatran, and rivaroxaban Cisplatin Cyclosporine Diuretics Medications for infection, such as acyclovir, adefovir, amphotericin B, bacitracin, cidofovir, foscarnet, ganciclovir, gentamicin, pentamidine, vancomycin NSAIDs, medications for pain and inflammation, such as ibuprofen or naproxen Other medications that cause heart rhythm changes Pamidronate Zoledronic acid This list may not describe all possible interactions. Give your health care provider a list of all the medicines, herbs, non-prescription drugs, or dietary supplements you use. Also tell them if you smoke, drink alcohol, or use illegal drugs. Some items may interact with your medicine. What should I watch for while using this medication? Your condition will be monitored carefully while you are receiving this medication. You may need blood work while taking this medication. This medication may make you feel generally unwell. This is not uncommon as chemotherapy can affect healthy cells as well as cancer cells. Report any side effects. Continue your course of treatment even though you feel ill unless your care team tells you to stop. This medication may increase your risk of getting an infection. Call your care team for advice if you get a fever, chills,  sore throat, or other symptoms of a cold or flu. Do not treat yourself. Try to avoid being around people who are sick. Avoid taking medications that contain aspirin, acetaminophen, ibuprofen, naproxen, or ketoprofen unless instructed by your care team. These medications may hide a fever. Be careful brushing or flossing your teeth or using a toothpick because you may get an infection or bleed more easily. If you have any dental work done, tell your dentist you are receiving this medication. This medication can make you more sensitive to cold. Do not drink cold drinks or use ice. Cover exposed skin before coming in contact with cold temperatures or cold objects. When out in cold weather wear warm clothing and cover your mouth and nose to warm the air that goes into your lungs. Tell your care team if you get sensitive to the cold. Talk to your care team if you or your partner are pregnant or think either of you might be pregnant. This medication can cause serious birth defects if taken during pregnancy and for 9 months after the last dose. A negative pregnancy test is required before  starting this medication. A reliable form of contraception is recommended while taking this medication and for 9 months after the last dose. Talk to your care team about effective forms of contraception. Do not father a child while taking this medication and for 6 months after the last dose. Use a condom while having sex during this time period. Do not breastfeed while taking this medication and for 3 months after the last dose. This medication may cause infertility. Talk to your care team if you are concerned about your fertility. What side effects may I notice from receiving this medication? Side effects that you should report to your care team as soon as possible: Allergic reactions--skin rash, itching, hives, swelling of the face, lips, tongue, or throat Bleeding--bloody or black, tar-like stools, vomiting blood or brown  material that looks like coffee grounds, red or dark brown urine, small red or purple spots on skin, unusual bruising or bleeding Dry cough, shortness of breath or trouble breathing Heart rhythm changes--fast or irregular heartbeat, dizziness, feeling faint or lightheaded, chest pain, trouble breathing Infection--fever, chills, cough, sore throat, wounds that don't heal, pain or trouble when passing urine, general feeling of discomfort or being unwell Liver injury--right upper belly pain, loss of appetite, nausea, light-colored stool, dark yellow or brown urine, yellowing skin or eyes, unusual weakness or fatigue Low red blood cell level--unusual weakness or fatigue, dizziness, headache, trouble breathing Muscle injury--unusual weakness or fatigue, muscle pain, dark yellow or brown urine, decrease in amount of urine Pain, tingling, or numbness in the hands or feet Sudden and severe headache, confusion, change in vision, seizures, which may be signs of posterior reversible encephalopathy syndrome (PRES) Unusual bruising or bleeding Side effects that usually do not require medical attention (report to your care team if they continue or are bothersome): Diarrhea Nausea Pain, redness, or swelling with sores inside the mouth or throat Unusual weakness or fatigue Vomiting This list may not describe all possible side effects. Call your doctor for medical advice about side effects. You may report side effects to FDA at 1-800-FDA-1088. Where should I keep my medication? This medication is given in a hospital or clinic. It will not be stored at home. NOTE: This sheet is a summary. It may not cover all possible information. If you have questions about this medicine, talk to your doctor, pharmacist, or health care provider.  2023 Elsevier/Gold Standard (2007-03-21 00:00:00) Bevacizumab Injection What is this medication? BEVACIZUMAB (be va SIZ yoo mab) treats some types of cancer. It works by blocking a  protein that causes cancer cells to grow and multiply. This helps to slow or stop the spread of cancer cells. It is a monoclonal antibody. This medicine may be used for other purposes; ask your health care provider or pharmacist if you have questions. COMMON BRAND NAME(S): Alymsys, Avastin, MVASI, Omer Jack What should I tell my care team before I take this medication? They need to know if you have any of these conditions: Blood clots Coughing up blood Having or recent surgery Heart failure High blood pressure History of a connection between 2 or more body parts that do not usually connect (fistula) History of a tear in your stomach or intestines Protein in your urine An unusual or allergic reaction to bevacizumab, other medications, foods, dyes, or preservatives Pregnant or trying to get pregnant Breast-feeding How should I use this medication? This medication is injected into a vein. It is given by your care team in a hospital or clinic setting. Talk to  your care team the use of this medication in children. Special care may be needed. Overdosage: If you think you have taken too much of this medicine contact a poison control center or emergency room at once. NOTE: This medicine is only for you. Do not share this medicine with others. What if I miss a dose? Keep appointments for follow-up doses. It is important not to miss your dose. Call your care team if you are unable to keep an appointment. What may interact with this medication? Interactions are not expected. This list may not describe all possible interactions. Give your health care provider a list of all the medicines, herbs, non-prescription drugs, or dietary supplements you use. Also tell them if you smoke, drink alcohol, or use illegal drugs. Some items may interact with your medicine. What should I watch for while using this medication? Your condition will be monitored carefully while you are receiving this medication. You may  need blood work while taking this medication. This medication may make you feel generally unwell. This is not uncommon as chemotherapy can affect healthy cells as well as cancer cells. Report any side effects. Continue your course of treatment even though you feel ill unless your care team tells you to stop. This medication may increase your risk to bruise or bleed. Call your care team if you notice any unusual bleeding. Before having surgery, talk to your care team to make sure it is ok. This medication can increase the risk of poor healing of your surgical site or wound. You will need to stop this medication for 28 days before surgery. After surgery, wait at least 28 days before restarting this medication. Make sure the surgical site or wound is healed enough before restarting this medication. Talk to your care team if questions. Talk to your care team if you may be pregnant. Serious birth defects can occur if you take this medication during pregnancy and for 6 months after the last dose. Contraception is recommended while taking this medication and for 6 months after the last dose. Your care team can help you find the option that works for you. Do not breastfeed while taking this medication and for 6 months after the last dose. This medication can cause infertility. Talk to your care team if you are concerned about your fertility. What side effects may I notice from receiving this medication? Side effects that you should report to your care team as soon as possible: Allergic reactions--skin rash, itching, hives, swelling of the face, lips, tongue, or throat Bleeding--bloody or black, tar-like stools, vomiting blood or brown material that looks like coffee grounds, red or dark brown urine, small red or purple spots on skin, unusual bruising or bleeding Blood clot--pain, swelling, or warmth in the leg, shortness of breath, chest pain Heart attack--pain or tightness in the chest, shoulders, arms, or jaw,  nausea, shortness of breath, cold or clammy skin, feeling faint or lightheaded Heart failure--shortness of breath, swelling of the ankles, feet, or hands, sudden weight gain, unusual weakness or fatigue Increase in blood pressure Infection--fever, chills, cough, sore throat, wounds that don't heal, pain or trouble when passing urine, general feeling of discomfort or being unwell Infusion reactions--chest pain, shortness of breath or trouble breathing, feeling faint or lightheaded Kidney injury--decrease in the amount of urine, swelling of the ankles, hands, or feet Stomach pain that is severe, does not go away, or gets worse Stroke--sudden numbness or weakness of the face, arm, or leg, trouble speaking, confusion, trouble walking,  loss of balance or coordination, dizziness, severe headache, change in vision Sudden and severe headache, confusion, change in vision, seizures, which may be signs of posterior reversible encephalopathy syndrome (PRES) Side effects that usually do not require medical attention (report to your care team if they continue or are bothersome): Back pain Change in taste Diarrhea Dry skin Increased tears Nosebleed This list may not describe all possible side effects. Call your doctor for medical advice about side effects. You may report side effects to FDA at 1-800-FDA-1088. Where should I keep my medication? This medication is given in a hospital or clinic. It will not be stored at home. NOTE: This sheet is a summary. It may not cover all possible information. If you have questions about this medicine, talk to your doctor, pharmacist, or health care provider.  2023 Elsevier/Gold Standard (2021-06-01 00:00:00)       To help prevent nausea and vomiting after your treatment, we encourage you to take your nausea medication as directed.  BELOW ARE SYMPTOMS THAT SHOULD BE REPORTED IMMEDIATELY: *FEVER GREATER THAN 100.4 F (38 C) OR HIGHER *CHILLS OR SWEATING *NAUSEA AND  VOMITING THAT IS NOT CONTROLLED WITH YOUR NAUSEA MEDICATION *UNUSUAL SHORTNESS OF BREATH *UNUSUAL BRUISING OR BLEEDING *URINARY PROBLEMS (pain or burning when urinating, or frequent urination) *BOWEL PROBLEMS (unusual diarrhea, constipation, pain near the anus) TENDERNESS IN MOUTH AND THROAT WITH OR WITHOUT PRESENCE OF ULCERS (sore throat, sores in mouth, or a toothache) UNUSUAL RASH, SWELLING OR PAIN  UNUSUAL VAGINAL DISCHARGE OR ITCHING   Items with * indicate a potential emergency and should be followed up as soon as possible or go to the Emergency Department if any problems should occur.  Please show the CHEMOTHERAPY ALERT CARD or IMMUNOTHERAPY ALERT CARD at check-in to the Emergency Department and triage nurse.  Should you have questions after your visit or need to cancel or reschedule your appointment, please contact [facility] [facility] C[facility] AT [facility] [phone]  and follow the prompts.  Office hours are 8:00 a.m. to 4:30 p.m. Monday - Friday. Please note that voicemails left after 4:00 p.m. may not be returned until the following business day.  We are closed weekends and major holidays. You have access to a nurse at all times for urgent questions. Please call the main number to the clinic [phone] and follow the prompts.  For any non-urgent questions, you may also contact your provider using MyChart. We now offer e-Visits for anyone 66 and older to request care online for non-urgent symptoms. For details visit [email or web address].   Also download the MyChart app! Go to the app store, search "MyChart", open the app, select [facility], and log in with your MyChart username and password.

## 2022-06-19 DIAGNOSIS — C7951 Secondary malignant neoplasm of bone: Secondary | ICD-10-CM | POA: Diagnosis not present

## 2022-06-19 DIAGNOSIS — C189 Malignant neoplasm of colon, unspecified: Secondary | ICD-10-CM | POA: Diagnosis not present

## 2022-06-19 DIAGNOSIS — M84521D Pathological fracture in neoplastic disease, right humerus, subsequent encounter for fracture with routine healing: Secondary | ICD-10-CM | POA: Diagnosis not present

## 2022-06-19 DIAGNOSIS — M1990 Unspecified osteoarthritis, unspecified site: Secondary | ICD-10-CM | POA: Diagnosis not present

## 2022-06-19 DIAGNOSIS — Z9181 History of falling: Secondary | ICD-10-CM | POA: Diagnosis not present

## 2022-06-20 ENCOUNTER — Inpatient Hospital Stay: Payer: Medicare HMO

## 2022-06-20 VITALS — BP 144/94 | HR 97 | Resp 18

## 2022-06-20 DIAGNOSIS — Z5112 Encounter for antineoplastic immunotherapy: Secondary | ICD-10-CM | POA: Diagnosis not present

## 2022-06-20 DIAGNOSIS — C7951 Secondary malignant neoplasm of bone: Secondary | ICD-10-CM | POA: Diagnosis not present

## 2022-06-20 DIAGNOSIS — D649 Anemia, unspecified: Secondary | ICD-10-CM | POA: Diagnosis not present

## 2022-06-20 DIAGNOSIS — G35 Multiple sclerosis: Secondary | ICD-10-CM | POA: Diagnosis not present

## 2022-06-20 DIAGNOSIS — C20 Malignant neoplasm of rectum: Secondary | ICD-10-CM

## 2022-06-20 DIAGNOSIS — C779 Secondary and unspecified malignant neoplasm of lymph node, unspecified: Secondary | ICD-10-CM | POA: Diagnosis not present

## 2022-06-20 DIAGNOSIS — Z5111 Encounter for antineoplastic chemotherapy: Secondary | ICD-10-CM | POA: Diagnosis not present

## 2022-06-20 DIAGNOSIS — M25551 Pain in right hip: Secondary | ICD-10-CM | POA: Diagnosis not present

## 2022-06-20 DIAGNOSIS — M25569 Pain in unspecified knee: Secondary | ICD-10-CM | POA: Diagnosis not present

## 2022-06-20 MED ORDER — SODIUM CHLORIDE 0.9% FLUSH
10.0000 mL | INTRAVENOUS | Status: DC | PRN
  Administered 2022-06-20: 10 mL

## 2022-06-20 MED ORDER — HEPARIN SOD (PORK) LOCK FLUSH 100 UNIT/ML IV SOLN
500.0000 [IU] | Freq: Once | INTRAVENOUS | Status: AC | PRN
  Administered 2022-06-20: 500 [IU]

## 2022-06-20 NOTE — Progress Notes (Signed)
Patient presents today for 5FU chemotherapy pump disconnection per provider's order. Vital signs stable and pt voiced no new complaints at this time. Port flushed easily without difficulty with good blood return noted. Needle removed intact and no bruising or swelling noted at the site.  Discharged from clinic via wheelchair in stable condition. Alert and oriented x 3. F/U with [facility] as scheduled.

## 2022-06-20 NOTE — Patient Instructions (Signed)
[facility]  Discharge Instructions: Thank you for choosing [facility] to provide your oncology and hematology care.  If you have a lab appointment with the Cancer Center - please note that after April 8th, 2024, all labs will be drawn in the cancer center.  You do not have to check in or register with the main entrance as you have in the past but will complete your check-in in the cancer center.  Wear comfortable clothing and clothing appropriate for easy access to any Portacath or PICC line.   We strive to give you quality time with your provider. You may need to reschedule your appointment if you arrive late (15 or more minutes).  Arriving late affects you and other patients whose appointments are after yours.  Also, if you miss three or more appointments without notifying the office, you may be dismissed from the clinic at the provider's discretion.      For prescription refill requests, have your pharmacy contact our office and allow 72 hours for refills to be completed.    Today you received chemotherapy pump d/c     BELOW ARE SYMPTOMS THAT SHOULD BE REPORTED IMMEDIATELY: *FEVER GREATER THAN 100.4 F (38 C) OR HIGHER *CHILLS OR SWEATING *NAUSEA AND VOMITING THAT IS NOT CONTROLLED WITH YOUR NAUSEA MEDICATION *UNUSUAL SHORTNESS OF BREATH *UNUSUAL BRUISING OR BLEEDING *URINARY PROBLEMS (pain or burning when urinating, or frequent urination) *BOWEL PROBLEMS (unusual diarrhea, constipation, pain near the anus) TENDERNESS IN MOUTH AND THROAT WITH OR WITHOUT PRESENCE OF ULCERS (sore throat, sores in mouth, or a toothache) UNUSUAL RASH, SWELLING OR PAIN  UNUSUAL VAGINAL DISCHARGE OR ITCHING   Items with * indicate a potential emergency and should be followed up as soon as possible or go to the Emergency Department if any problems should occur.  Please show the CHEMOTHERAPY ALERT CARD or IMMUNOTHERAPY ALERT CARD at check-in to the Emergency Department and  triage nurse.  Should you have questions after your visit or need to cancel or reschedule your appointment, please contact [facility] [phone]  and follow the prompts.  Office hours are 8:00 a.m. to 4:30 p.m. Monday - Friday. Please note that voicemails left after 4:00 p.m. may not be returned until the following business day.  We are closed weekends and major holidays. You have access to a nurse at all times for urgent questions. Please call the main number to the clinic [phone] and follow the prompts.  For any non-urgent questions, you may also contact your provider using MyChart. We now offer e-Visits for anyone 18 and older to request care online for non-urgent symptoms. For details visit [email or web address].   Also download the MyChart app! Go to the app store, search "MyChart", open the app, select [facility], and log in with your MyChart username and password.   

## 2022-06-21 DIAGNOSIS — M84521D Pathological fracture in neoplastic disease, right humerus, subsequent encounter for fracture with routine healing: Secondary | ICD-10-CM | POA: Diagnosis not present

## 2022-06-21 DIAGNOSIS — M1990 Unspecified osteoarthritis, unspecified site: Secondary | ICD-10-CM | POA: Diagnosis not present

## 2022-06-21 DIAGNOSIS — C7951 Secondary malignant neoplasm of bone: Secondary | ICD-10-CM | POA: Diagnosis not present

## 2022-06-21 DIAGNOSIS — C189 Malignant neoplasm of colon, unspecified: Secondary | ICD-10-CM | POA: Diagnosis not present

## 2022-06-21 DIAGNOSIS — Z9181 History of falling: Secondary | ICD-10-CM | POA: Diagnosis not present

## 2022-06-24 ENCOUNTER — Encounter: Payer: Self-pay | Admitting: Physician Assistant

## 2022-06-24 ENCOUNTER — Telehealth: Payer: Self-pay | Admitting: Physician Assistant

## 2022-06-24 DIAGNOSIS — C7951 Secondary malignant neoplasm of bone: Secondary | ICD-10-CM | POA: Diagnosis not present

## 2022-06-24 DIAGNOSIS — Z9181 History of falling: Secondary | ICD-10-CM | POA: Diagnosis not present

## 2022-06-24 DIAGNOSIS — M84521D Pathological fracture in neoplastic disease, right humerus, subsequent encounter for fracture with routine healing: Secondary | ICD-10-CM | POA: Diagnosis not present

## 2022-06-24 DIAGNOSIS — M1990 Unspecified osteoarthritis, unspecified site: Secondary | ICD-10-CM | POA: Diagnosis not present

## 2022-06-24 DIAGNOSIS — C189 Malignant neoplasm of colon, unspecified: Secondary | ICD-10-CM | POA: Diagnosis not present

## 2022-06-24 NOTE — Telephone Encounter (Signed)
I called patient's son, Rommie Zanghi, per his request as he had some questions about the treatment plan moving forward. I reviewed Dr. Marice Potter recommendations per his last note from 06/18/2022. The plan is to continue current chemotherapy for two more cycles and then obtaining repeat imaging to assess treatment response. Patient's son expressed understanding of the plan provided. I encouraged him to reach out to Dr. Marice Potter team if he has any further questions or concerns.

## 2022-06-26 DIAGNOSIS — Z9181 History of falling: Secondary | ICD-10-CM | POA: Diagnosis not present

## 2022-06-26 DIAGNOSIS — M1990 Unspecified osteoarthritis, unspecified site: Secondary | ICD-10-CM | POA: Diagnosis not present

## 2022-06-26 DIAGNOSIS — M84521D Pathological fracture in neoplastic disease, right humerus, subsequent encounter for fracture with routine healing: Secondary | ICD-10-CM | POA: Diagnosis not present

## 2022-06-26 DIAGNOSIS — C7951 Secondary malignant neoplasm of bone: Secondary | ICD-10-CM | POA: Diagnosis not present

## 2022-06-26 DIAGNOSIS — C189 Malignant neoplasm of colon, unspecified: Secondary | ICD-10-CM | POA: Diagnosis not present

## 2022-07-01 ENCOUNTER — Other Ambulatory Visit: Payer: Self-pay

## 2022-07-01 DIAGNOSIS — Z9181 History of falling: Secondary | ICD-10-CM | POA: Diagnosis not present

## 2022-07-01 DIAGNOSIS — C189 Malignant neoplasm of colon, unspecified: Secondary | ICD-10-CM | POA: Diagnosis not present

## 2022-07-01 DIAGNOSIS — M1990 Unspecified osteoarthritis, unspecified site: Secondary | ICD-10-CM | POA: Diagnosis not present

## 2022-07-01 DIAGNOSIS — C7951 Secondary malignant neoplasm of bone: Secondary | ICD-10-CM | POA: Diagnosis not present

## 2022-07-01 DIAGNOSIS — C20 Malignant neoplasm of rectum: Secondary | ICD-10-CM

## 2022-07-01 DIAGNOSIS — M84521D Pathological fracture in neoplastic disease, right humerus, subsequent encounter for fracture with routine healing: Secondary | ICD-10-CM | POA: Diagnosis not present

## 2022-07-02 ENCOUNTER — Inpatient Hospital Stay (HOSPITAL_BASED_OUTPATIENT_CLINIC_OR_DEPARTMENT_OTHER): Payer: Medicare HMO | Admitting: Hematology

## 2022-07-02 ENCOUNTER — Inpatient Hospital Stay: Payer: Medicare HMO

## 2022-07-02 VITALS — BP 144/80 | HR 69 | Temp 97.3°F | Resp 18

## 2022-07-02 DIAGNOSIS — C20 Malignant neoplasm of rectum: Secondary | ICD-10-CM

## 2022-07-02 DIAGNOSIS — M25551 Pain in right hip: Secondary | ICD-10-CM | POA: Diagnosis not present

## 2022-07-02 DIAGNOSIS — G35 Multiple sclerosis: Secondary | ICD-10-CM | POA: Diagnosis not present

## 2022-07-02 DIAGNOSIS — Z5112 Encounter for antineoplastic immunotherapy: Secondary | ICD-10-CM | POA: Diagnosis not present

## 2022-07-02 DIAGNOSIS — D649 Anemia, unspecified: Secondary | ICD-10-CM | POA: Diagnosis not present

## 2022-07-02 DIAGNOSIS — C7951 Secondary malignant neoplasm of bone: Secondary | ICD-10-CM | POA: Diagnosis not present

## 2022-07-02 DIAGNOSIS — C779 Secondary and unspecified malignant neoplasm of lymph node, unspecified: Secondary | ICD-10-CM | POA: Diagnosis not present

## 2022-07-02 DIAGNOSIS — Z5111 Encounter for antineoplastic chemotherapy: Secondary | ICD-10-CM | POA: Diagnosis not present

## 2022-07-02 DIAGNOSIS — M25569 Pain in unspecified knee: Secondary | ICD-10-CM | POA: Diagnosis not present

## 2022-07-02 LAB — CBC WITH DIFFERENTIAL/PLATELET
Abs Immature Granulocytes: 0.02 K/uL (ref 0.00–0.07)
Basophils Absolute: 0 K/uL (ref 0.0–0.1)
Basophils Relative: 1 %
Eosinophils Absolute: 0.3 K/uL (ref 0.0–0.5)
Eosinophils Relative: 5 %
HCT: 36 % — ABNORMAL LOW (ref 39.0–52.0)
Hemoglobin: 11.5 g/dL — ABNORMAL LOW (ref 13.0–17.0)
Immature Granulocytes: 0 %
Lymphocytes Relative: 7 %
Lymphs Abs: 0.4 K/uL — ABNORMAL LOW (ref 0.7–4.0)
MCH: 28.2 pg (ref 26.0–34.0)
MCHC: 31.9 g/dL (ref 30.0–36.0)
MCV: 88.2 fL (ref 80.0–100.0)
Monocytes Absolute: 0.8 K/uL (ref 0.1–1.0)
Monocytes Relative: 14 %
Neutro Abs: 4.4 K/uL (ref 1.7–7.7)
Neutrophils Relative %: 73 %
Platelets: 209 K/uL (ref 150–400)
RBC: 4.08 MIL/uL — ABNORMAL LOW (ref 4.22–5.81)
RDW: 18.5 % — ABNORMAL HIGH (ref 11.5–15.5)
WBC: 5.9 K/uL (ref 4.0–10.5)
nRBC: 0 % (ref 0.0–0.2)

## 2022-07-02 LAB — COMPREHENSIVE METABOLIC PANEL WITH GFR
ALT: 14 U/L (ref 0–44)
AST: 28 U/L (ref 15–41)
Albumin: 3.3 g/dL — ABNORMAL LOW (ref 3.5–5.0)
Alkaline Phosphatase: 120 U/L (ref 38–126)
Anion gap: 9 (ref 5–15)
BUN: 20 mg/dL (ref 8–23)
CO2: 22 mmol/L (ref 22–32)
Calcium: 8.6 mg/dL — ABNORMAL LOW (ref 8.9–10.3)
Chloride: 103 mmol/L (ref 98–111)
Creatinine, Ser: 0.52 mg/dL — ABNORMAL LOW (ref 0.61–1.24)
GFR, Estimated: >60 mL/min (ref >60)
Glucose, Bld: 153 mg/dL — ABNORMAL HIGH (ref 70–99)
Potassium: 3.8 mmol/L (ref 3.5–5.1)
Sodium: 134 mmol/L — ABNORMAL LOW (ref 135–145)
Total Bilirubin: 0.8 mg/dL (ref 0.3–1.2)
Total Protein: 7.3 g/dL (ref 6.5–8.1)

## 2022-07-02 LAB — MAGNESIUM: Magnesium: 1.9 mg/dL (ref 1.7–2.4)

## 2022-07-02 MED ORDER — SODIUM CHLORIDE 0.9 % IV SOLN
10.0000 mg | Freq: Once | INTRAVENOUS | Status: AC
  Administered 2022-07-02: 10 mg via INTRAVENOUS
  Filled 2022-07-02: qty 10

## 2022-07-02 MED ORDER — PALONOSETRON HCL INJECTION 0.25 MG/5ML
0.2500 mg | Freq: Once | INTRAVENOUS | Status: AC
  Administered 2022-07-02: 0.25 mg via INTRAVENOUS
  Filled 2022-07-02: qty 5

## 2022-07-02 MED ORDER — SODIUM CHLORIDE 0.9 % IV SOLN
5000.0000 mg | INTRAVENOUS | Status: DC
  Administered 2022-07-02: 5000 mg via INTRAVENOUS
  Filled 2022-07-02: qty 100

## 2022-07-02 MED ORDER — SODIUM CHLORIDE 0.9 % IV SOLN: Freq: Once | INTRAVENOUS | Status: AC

## 2022-07-02 MED ORDER — SODIUM CHLORIDE 0.9 % IV SOLN
400.0000 mg | Freq: Once | INTRAVENOUS | Status: AC
  Administered 2022-07-02: 400 mg via INTRAVENOUS
  Filled 2022-07-02: qty 16

## 2022-07-02 MED ORDER — OXALIPLATIN CHEMO INJECTION 100 MG/20ML
180.0000 mg | Freq: Once | INTRAVENOUS | Status: AC
  Administered 2022-07-02: 180 mg via INTRAVENOUS
  Filled 2022-07-02: qty 36

## 2022-07-02 MED ORDER — LIDOCAINE-PRILOCAINE 2.5-2.5 % EX CREA: TOPICAL_CREAM | CUTANEOUS | 3 refills | Status: DC

## 2022-07-02 MED ORDER — DEXTROSE 5 % IV SOLN: Freq: Once | INTRAVENOUS | Status: AC

## 2022-07-02 MED ORDER — FLUOROURACIL CHEMO INJECTION 2.5 GM/50ML
850.0000 mg | Freq: Once | INTRAVENOUS | Status: AC
  Administered 2022-07-02: 850 mg via INTRAVENOUS
  Filled 2022-07-02: qty 17

## 2022-07-02 MED ORDER — HYDROMORPHONE HCL 1 MG/ML IJ SOLN
2.0000 mg | Freq: Once | INTRAMUSCULAR | Status: AC
  Administered 2022-07-02: 2 mg via INTRAVENOUS
  Filled 2022-07-02: qty 2

## 2022-07-02 MED ORDER — LEUCOVORIN CALCIUM INJECTION 350 MG
836.0000 mg | Freq: Once | INTRAVENOUS | Status: AC
  Administered 2022-07-02: 836 mg via INTRAVENOUS
  Filled 2022-07-02: qty 41.8

## 2022-07-02 MED ORDER — HYDROMORPHONE HCL 2 MG PO TABS: 2.0000 mg | ORAL_TABLET | ORAL | 0 refills | Status: DC | PRN

## 2022-07-02 NOTE — Progress Notes (Signed)
[facility] [address] [place] [address]    Clinic Day:  07/02/2022  Referring physician: [name], MD  Patient Care Team: Default, Provider, MD as PCP - General Doreatha Massed, MD as Medical Oncologist (Medical Oncology)   ASSESSMENT & PLAN:   Assessment: 1.  Metastatic rectal cancer to the bones and lymph nodes: - S/p ORIF of proximal right humerus (04/12/2022) and left femoral neck for pathological fractures (02/06/2021) after presentation to the ER due to left hip pain after mechanical fall. - Pathology (02/06/2022): Left femoral head resection consistent with poorly differentiated carcinoma. - Colonoscopy (02/25/2022): Infiltrative, polypoid and ulcerated nonobstructing medium-sized mass found in the rectum, measuring 4 cm in length, not circumferential. - Pathology: Rectal biopsy-poorly differentiated carcinoma. - PET scan (05/02/2022): Mildly hypermetabolic retroperitoneal, pelvic and inguinal lymph nodes.  Single small hypermetabolic left supraclavicular lymph node.  Nonspecific low-level metabolic activity in the presacral/pelvic soft tissues.  Bone metastasis-right lateral mass of C1, L2 spinous process, upper sacrum, both proximal humeri, around the right sacroiliac joint, multiple ribs, throughout the bony pelvis and proximal left femur. - NGS (foundation 1): KRAS/NRAS wild-type, APC L914, CDK 8 amplification-equivocal, T p53.  MS-stable. - FOLFOX and bevacizumab from 04/08/2022   2.  Social/family history: - Lives at home by himself.  He needs walker for ambulation.  He has bad knees from arthritis.  He has 3 children who live close by and check on him daily.  He did Radio broadcast assistant.  Non-smoker. - No family history of malignancies.    Plan: 1.  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 - He did not experience any cold sensitivity or tingling or  numbness in extremities. - He is continuing physical therapy 3 times weekly and is able to move his right arm better than last visit.  He is also using exercise bike. - No major GI side effects noted. - Reviewed labs today: Normal LFTs and creatinine.  CBC grossly normal.  UA was negative for protein. - He will proceed with cycle 6 chemotherapy today. - We will add CEA level today.  We will schedule for PET scan prior to next visit in 2 weeks.   2.  Back pain radiating to the right posterior thigh/right knee pain: - He stopped taking OxyContin and Dilaudid about 3 weeks ago. - He has experienced severe pain in the last 2 days.  He has also not been taking gabapentin. - Recommend that he start back on gabapentin 300 mg at bedtime. - Will send Dilaudid 2 mg every 4 hours as needed #90.   3.  Normocytic anemia: - Last Venofer infusion on 05/23/2022.  Ferritin on 06/18/2018 41096, percent saturation 12.  Will closely monitor.    Orders Placed This Encounter  Procedures   CT CHEST ABDOMEN PELVIS W CONTRAST    Standing Status:   Future    Standing Expiration Date:   07/01/2023    Order Specific Question:   If indicated for the ordered procedure, I authorize the administration of contrast media per Radiology protocol    Answer:   Yes    Order Specific Question:   Does the patient have a contrast media/X-ray dye allergy?    Answer:   No    Order Specific Question:   Preferred imaging location?    Answer:   [facility]    Order Specific Question:   If  indicated for the ordered procedure, I authorize the administration of oral contrast media per Radiology protocol    Answer:   Yes   NM PET Image Restag (PS) Skull Base To Thigh    Standing Status:   Future    Standing Expiration Date:   07/02/2023    Order Specific Question:   If indicated for the ordered procedure, I authorize the administration of a radiopharmaceutical per Radiology protocol    Answer:   Yes    Order Specific Question:    Preferred imaging location?    Answer:   [facility]   Magnesium    Standing Status:   Future    Standing Expiration Date:   07/16/2023   CBC with Differential (Cancer Center Only)    Standing Status:   Future    Standing Expiration Date:   07/16/2023   CMP (Cancer Center only)    Standing Status:   Future    Standing Expiration Date:   07/16/2023   Total Protein, Urine dipstick    Standing Status:   Future    Standing Expiration Date:   07/16/2023   Magnesium    Standing Status:   Future    Standing Expiration Date:   07/30/2023   CBC with Differential (Cancer Center Only)    Standing Status:   Future    Standing Expiration Date:   07/30/2023   CMP (Cancer Center only)    Standing Status:   Future    Standing Expiration Date:   07/30/2023   Total Protein, Urine dipstick    Standing Status:   Future    Standing Expiration Date:   07/30/2023   Magnesium    Standing Status:   Future    Standing Expiration Date:   08/13/2023   CBC with Differential (Cancer Center Only)    Standing Status:   Future    Standing Expiration Date:   08/13/2023   CMP (Cancer Center only)    Standing Status:   Future    Standing Expiration Date:   08/13/2023   Total Protein, Urine dipstick    Standing Status:   Future    Standing Expiration Date:   08/13/2023   Magnesium    Standing Status:   Future    Standing Expiration Date:   08/27/2023   CBC with Differential (Cancer Center Only)    Standing Status:   Future    Standing Expiration Date:   08/27/2023   CMP (Cancer Center only)    Standing Status:   Future    Standing Expiration Date:   08/27/2023   Total Protein, Urine dipstick    Standing Status:   Future    Standing Expiration Date:   08/27/2023   CEA    Standing Status:   Standing    Number of Occurrences:   10    Standing Expiration Date:   07/02/2023      I,Katie Daubenspeck,acting as a scribe for Doreatha Massed, MD.,have documented all relevant documentation on the behalf of Doreatha Massed,  MD,as directed by  Doreatha Massed, MD while in the presence of Doreatha Massed, MD.   I, Doreatha Massed MD, have reviewed the above documentation for accuracy and completeness, and I agree with the above.   Doreatha Massed, MD   5/21/20245:54 PM  CHIEF COMPLAINT:   Diagnosis: metastatic rectal adenocarcinoma    Cancer Staging  Rectal carcinoma [facility]) Staging form: Colon and Rectum, AJCC 8th Edition - Clinical stage from 03/19/2022: Stage IVB (cTX, cN2b,  WJ1B) - Signed by [name], MD on 05/21/2022    Prior Therapy: postop/palliative radiation to left hip, right humerus, and L3 05/14/22 - 06/03/22   Current Therapy:  FOLFOX + bevacizumab    HISTORY OF PRESENT ILLNESS:   Oncology History  Rectal carcinoma (HCC)  03/19/2022 Initial Diagnosis   Rectal carcinoma (HCC)   03/19/2022 Cancer Staging   Staging form: Colon and Rectum, AJCC 8th Edition - Clinical stage from 03/19/2022: Stage IVB (cTX, cN2b, cM1b) - Signed by [name], MD on 05/21/2022 Stage prefix: Initial diagnosis   04/08/2022 -  Chemotherapy   Patient is on Treatment Plan : COLORECTAL FOLFOX + Bevacizumab q14d        INTERVAL HISTORY:   Carla is a 62 y.o. male presenting to clinic today for follow up of metastatic rectal adenocarcinoma. He was last seen by me on 06/18/22.  Today, he states that he is doing well overall. His appetite level is at 100%. His energy level is at 100%.  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   with abnormal R wave progression noted on EKG   MRSA (methicillin resistant Staphylococcus aureus)    Pre-diabetes    Rectal carcinoma (HCC) 03/19/2022    Surgical History: Past Surgical History:  Procedure Laterality Date   COLONOSCOPY     HUMERUS IM NAIL Right 04/12/2022   Procedure: INTRAMEDULLARY (IM) NAIL HUMERAL;   Surgeon: Bjorn Pippin, MD;  Location: [facility];  Service: Orthopedics;  Laterality: Right;   IR IMAGING GUIDED PORT INSERTION  04/05/2022   TOTAL HIP ARTHROPLASTY Left 02/06/2022   Procedure: TOTAL HIP ARTHROPLASTY;  Surgeon: Joen Laura, MD;  Location: [facility];  Service: Orthopedics;  Laterality: Left;    Social History: Social History   Socioeconomic History   Marital status: Divorced    Spouse name: Not on file   Number of children: Not on file   Years of education: Not on file   Highest education level: Not on file  Occupational History   Not on file  Tobacco Use   Smoking status: Never   Smokeless tobacco: Never  Vaping Use   Vaping Use: Never used  Substance and Sexual Activity   Alcohol use: No   Drug use: No   Sexual activity: Not on file  Other Topics Concern   Not on file  Social History Narrative   Not on file   Social Determinants of Health   Financial Resource Strain: Not on file  Food Insecurity: No Food Insecurity (06/10/2022)   Hunger Vital Sign    Worried About Running Out of Food in the Last Year: Never true    Ran Out of Food in the Last Year: Never true  Transportation Needs: No Transportation Needs (06/10/2022)   PRAPARE - Administrator, Civil Service (Medical): No    Lack of Transportation (Non-Medical): No  Physical Activity: Not on file  Stress: Not on file  Social Connections: Not on file  Intimate Partner Violence: Not At Risk (02/04/2022)   Humiliation, Afraid, Rape, and Kick questionnaire    Fear of Current or Ex-Partner: No    Emotionally Abused: No    Physically Abused: No    Sexually Abused: No    Family History: Family History  Problem Relation Age of Onset  Stroke Mother    Hypertension Mother    Heart disease Father    Alzheimer's disease Father    Hypertension Brother    Alcohol abuse Brother    Cancer Maternal Aunt    Heart disease Maternal Aunt    Cancer Maternal Uncle    Heart disease Maternal  Uncle    Cancer Paternal Aunt    Heart disease Paternal Aunt    Cancer Paternal Uncle    Heart disease Paternal Uncle    Heart disease Paternal Grandmother    Alzheimer's disease Paternal Grandfather    Stomach cancer Neg Hx    Rectal cancer Neg Hx    Esophageal cancer Neg Hx    Colon cancer Neg Hx     Current Medications:  Current Outpatient Medications:    b complex vitamins capsule, Take 1 capsule by mouth daily., Disp: , Rfl:    fluconazole (DIFLUCAN) 150 MG tablet, Take 1 tablet (150 mg total) by mouth once a week., Disp: 4 tablet, Rfl: 0   gabapentin (NEURONTIN) 300 MG capsule, Take 1 capsule (300 mg total) by mouth at bedtime., Disp: 30 capsule, Rfl: 1   lidocaine-prilocaine (EMLA) cream, Apply a quarter-sized amount to port a cath site and cover with plastic wrap one hour prior to infusion appointments, Disp: 30 g, Rfl: 3   Multiple Vitamin (MULTIVITAMIN WITH MINERALS) TABS tablet, Take 1 tablet by mouth daily., Disp: , Rfl:    ondansetron (ZOFRAN) 8 MG tablet, Take 11.5 tablets (92 mg total) by mouth every 8 (eight) hours as needed for nausea or vomiting. (Patient taking differently: Take 8-16 mg by mouth every 8 (eight) hours as needed for nausea or vomiting.), Disp: 90 tablet, Rfl: 0   prochlorperazine (COMPAZINE) 10 MG tablet, Take 1 tablet (10 mg total) by mouth every 6 (six) hours as needed for nausea or vomiting., Disp: 90 tablet, Rfl: 0   VITAMIN A PO, Take 1 tablet by mouth daily., Disp: , Rfl:    cyclobenzaprine (FLEXERIL) 5 MG tablet, Take 1 tablet (5 mg total) by mouth 3 (three) times daily as needed for muscle spasms., Disp: 30 tablet, Rfl: 0   HYDROmorphone (DILAUDID) 2 MG tablet, Take 1 tablet (2 mg total) by mouth every 4 (four) hours as needed for severe pain., Disp: 90 tablet, Rfl: 0   methocarbamol (ROBAXIN) 500 MG tablet, Take 1 tablet (500 mg total) by mouth every 8 (eight) hours as needed for muscle spasms., Disp: 30 tablet, Rfl: 0   oxyCODONE ER (XTAMPZA ER)  13.5 MG C12A, Take 13.5 mg by mouth every 8 (eight) hours as needed., Disp: 90 capsule, Rfl: 0 No current facility-administered medications for this visit.  Facility-Administered Medications Ordered in Other Visits:    fluorouracil (ADRUCIL) 5,000 mg in sodium chloride 0.9 % 150 mL chemo infusion, 5,000 mg, Intravenous, 1 day or 1 dose, Doreatha Massed, MD, Infusion Verify at 07/02/22 1433   Allergies: Allergies  Allergen Reactions   Codeine Nausea And Vomiting   Lisinopril Cough    REVIEW OF SYSTEMS:   Review of Systems  Constitutional:  Negative for chills, fatigue and fever.  HENT:   Negative for lump/mass, mouth sores, nosebleeds, sore throat and trouble swallowing.   Eyes:  Negative for eye problems.  Respiratory:  Negative for cough and shortness of breath.   Cardiovascular:  Negative for chest pain, leg swelling and palpitations.  Gastrointestinal:  Positive for nausea. Negative for abdominal pain, constipation, diarrhea and vomiting.  Genitourinary:  Negative for bladder  incontinence, difficulty urinating, dysuria, frequency, hematuria and nocturia.   Musculoskeletal:  Negative for arthralgias, back pain, flank pain, myalgias and neck pain.  Skin:  Negative for itching and rash.  Neurological:  Positive for dizziness. Negative for headaches and numbness.  Hematological:  Does not bruise/bleed easily.  Psychiatric/Behavioral:  Negative for depression, sleep disturbance and suicidal ideas. The patient is not nervous/anxious.   All other systems reviewed and are negative.    VITALS:   There were no vitals taken for this visit.  Wt Readings from Last 3 Encounters:  07/02/22 193 lb (87.5 kg)  06/18/22 200 lb (90.7 kg)  06/04/22 208 lb (94.3 kg)    There is no height or weight on file to calculate BMI.  Performance status (ECOG): 1 - Symptomatic but completely ambulatory  PHYSICAL EXAM:   Physical Exam Vitals and nursing note reviewed. Exam conducted with a  chaperone present.  Constitutional:      Appearance: Normal appearance.  Cardiovascular:     Rate and Rhythm: Normal rate and regular rhythm.     Pulses: Normal pulses.     Heart sounds: Normal heart sounds.  Pulmonary:     Effort: Pulmonary effort is normal.     Breath sounds: Normal breath sounds.  Abdominal:     Palpations: Abdomen is soft. There is no hepatomegaly, splenomegaly or mass.     Tenderness: There is no abdominal tenderness.  Musculoskeletal:     Right lower leg: No edema.     Left lower leg: No edema.  Lymphadenopathy:     Cervical: No cervical adenopathy.     Right cervical: No superficial, deep or posterior cervical adenopathy.    Left cervical: No superficial, deep or posterior cervical adenopathy.     Upper Body:     Right upper body: No supraclavicular or axillary adenopathy.     Left upper body: No supraclavicular or axillary adenopathy.  Neurological:     General: No focal deficit present.     Mental Status: He is alert and oriented to person, place, and time.  Psychiatric:        Mood and Affect: Mood normal.        Behavior: Behavior normal.     LABS:      Latest Ref Rng & Units 07/02/2022    7:58 AM 06/17/2022   10:37 AM 05/21/2022    9:26 AM  CBC  WBC 4.0 - 10.5 K/uL 5.9  4.1  4.0   Hemoglobin 13.0 - 17.0 g/dL 16.1  09.6  04.5   Hematocrit 39.0 - 52.0 % 36.0  31.8  33.5   Platelets 150 - 400 K/uL 209  380  181       Latest Ref Rng & Units 07/02/2022    7:58 AM 06/17/2022   10:37 AM 05/21/2022    9:26 AM  CMP  Glucose 70 - 99 mg/dL 409  811  914   BUN 8 - 23 mg/dL 20  13  11    Creatinine 0.61 - 1.24 mg/dL 7.82  9.56  2.13   Sodium 135 - 145 mmol/L 134  132  141   Potassium 3.5 - 5.1 mmol/L 3.8  3.6  4.0   Chloride 98 - 111 mmol/L 103  98  107   CO2 22 - 32 mmol/L 22  24  25    Calcium 8.9 - 10.3 mg/dL 8.6  8.6  9.0   Total Protein 6.5 - 8.1 g/dL 7.3  7.2  6.8  Total Bilirubin 0.3 - 1.2 mg/dL 0.8  0.6  0.5   Alkaline Phos 38 - 126 U/L 120   82  154   AST 15 - 41 U/L 28  21  18    ALT 0 - 44 U/L 14  19  9       Lab Results  Component Value Date   CEA 11.17 (H) 03/15/2022   /  CEA [facility])  Date Value Ref Range Status  03/15/2022 11.17 (H) 0.00 - 5.00 ng/mL Final    Comment:    (NOTE) This test was performed using Beckman Coulter's paramagnetic chemiluminescent immunoassay. Values obtained from different assay methods cannot be used interchangeably. Please note that up to 8% of patients who smoke may see values 5.1-10.0 ng/ml and 1% of patients who smoke may see CEA levels >10.0 ng/ml. Performed at [facility], [address], [place], [place] [address]    Lab Results  Component Value Date   PSA1 1.8 02/04/2022   No results found for: "UEA540" No results found for: "JWJ191"  Lab Results  Component Value Date   TOTALPROTELP 6.6 02/06/2022   ALBUMINELP 3.0 02/04/2022   A1GS 0.4 02/04/2022   A2GS 1.0 02/04/2022   BETS 0.9 02/04/2022   GAMS 1.1 02/04/2022   MSPIKE Not Observed 02/04/2022   SPEI Comment 02/04/2022   Lab Results  Component Value Date   TIBC 196 (L) 06/18/2022   TIBC 197 (L) 04/08/2022   FERRITIN 1,096 (H) 06/18/2022   FERRITIN 580 (H) 04/08/2022   IRONPCTSAT 12 (L) 06/18/2022   IRONPCTSAT 9 (L) 04/08/2022   No results found for: "LDH"   STUDIES:   DG Hip Unilat W or Wo Pelvis 2-3 Views Right  Result Date: 06/04/2022 CLINICAL DATA:  Right leg pain without known injury. History of rectal cancer. EXAM: DG HIP (WITH OR WITHOUT PELVIS) 2-3V RIGHT COMPARISON:  PET scan of May 02, 2022. FINDINGS: There is no evidence of hip fracture or dislocation. There is no evidence of arthropathy. Status post left total hip arthroplasty. Large lytic expansile lesion is noted involving the left superior pubic ramus consistent with metastatic disease. IMPRESSION: Right hip is unremarkable. Status post left total hip arthroplasty. Expansile lytic lesion is noted in left superior pubic ramus  consistent with osseous metastases. Electronically Signed   By: Lupita Raider M.D.   On: 06/04/2022 18:14   US Venous Img Lower Unilateral Right (DVT)  Result Date: 06/04/2022 CLINICAL DATA:  Right lower extremity pain EXAM: RIGHT LOWER EXTREMITY VENOUS DOPPLER ULTRASOUND TECHNIQUE: Gray-scale sonography with compression, as well as color and duplex ultrasound, were performed to evaluate the deep venous system(s) from the level of the common femoral vein through the popliteal and proximal calf veins. COMPARISON:  None Available. FINDINGS: VENOUS Normal compressibility of the common femoral, superficial femoral, and popliteal veins, as well as the visualized calf veins. Visualized portions of profunda femoral vein and great saphenous vein unremarkable. No filling defects to suggest DVT on grayscale or color Doppler imaging. Doppler waveforms show normal direction of venous flow, normal respiratory plasticity and response to augmentation. Limited views of the contralateral common femoral vein are unremarkable. OTHER None. Limitations: none IMPRESSION: Negative for DVT in the right lower extremity. Electronically Signed   By: Wiliam Ke M.D.   On: 06/04/2022 17:19

## 2022-07-02 NOTE — Progress Notes (Signed)
Patient with new BSA 2.09 m2 (87.5 kg) -  Adjust chemotherapy doses to new weight.  T.O. Dr Carilyn Goodpasture, PharmD

## 2022-07-02 NOTE — Progress Notes (Signed)
Patient has been examined by Dr. Katragadda. Vital signs and labs have been reviewed by MD - ANC, Creatinine, LFTs, hemoglobin, and platelets are within treatment parameters per M.D. - pt may proceed with treatment.  Primary RN and pharmacy notified.  

## 2022-07-02 NOTE — Progress Notes (Signed)
Patient presents today for MVASI/Folfox infusion. Patient is in satisfactory condition with no new complaints voiced.  Vital signs are stable.  Labs reviewed by Dr. Ellin Saba during the office visit and all labs are within treatment parameters.  We will proceed with treatment per MD orders.   Pt c/o right leg pain 10/10. Dr.K aware and stated to give Dilaudid 2mg  IV x 1 dose. Pt asked if Dr.K could send him a prescription of Dilaudid 2 mg p.o. because it doesn't make him feel loopy and it resolved his pain. Message sent to Dr.K and he stated prescription he would  sent to pt's pharmacy on file. Pt made aware and verbalized understanding.  Treatment given today per MD orders. Tolerated infusion without adverse affects. Vital signs stable. No complaints at this time. Discharged from clinic ambulatory in stable condition. Alert and oriented x 3. F/U with [facility] as scheduled. 5FU ambulatory pump infusing.

## 2022-07-02 NOTE — Patient Instructions (Signed)
[facility]  Discharge Instructions: Thank you for choosing [facility] to provide your oncology and hematology care.  If you have a lab appointment with the Cancer Center - please note that after April 8th, 2024, all labs will be drawn in the cancer center.  You do not have to check in or register with the main entrance as you have in the past but will complete your check-in in the cancer center.  Wear comfortable clothing and clothing appropriate for easy access to any Portacath or PICC line.   We strive to give you quality time with your provider. You may need to reschedule your appointment if you arrive late (15 or more minutes).  Arriving late affects you and other patients whose appointments are after yours.  Also, if you miss three or more appointments without notifying the office, you may be dismissed from the clinic at the provider's discretion.      For prescription refill requests, have your pharmacy contact our office and allow 72 hours for refills to be completed.    Today you received the following chemotherapy and/or immunotherapy agents MVASI/ and folfox   To help prevent nausea and vomiting after your treatment, we encourage you to take your nausea medication as directed.  BELOW ARE SYMPTOMS THAT SHOULD BE REPORTED IMMEDIATELY: *FEVER GREATER THAN 100.4 F (38 C) OR HIGHER *CHILLS OR SWEATING *NAUSEA AND VOMITING THAT IS NOT CONTROLLED WITH YOUR NAUSEA MEDICATION *UNUSUAL SHORTNESS OF BREATH *UNUSUAL BRUISING OR BLEEDING *URINARY PROBLEMS (pain or burning when urinating, or frequent urination) *BOWEL PROBLEMS (unusual diarrhea, constipation, pain near the anus) TENDERNESS IN MOUTH AND THROAT WITH OR WITHOUT PRESENCE OF ULCERS (sore throat, sores in mouth, or a toothache) UNUSUAL RASH, SWELLING OR PAIN  UNUSUAL VAGINAL DISCHARGE OR ITCHING   Items with * indicate a potential emergency and should be followed up as soon as possible or go to  the Emergency Department if any problems should occur.  Please show the CHEMOTHERAPY ALERT CARD or IMMUNOTHERAPY ALERT CARD at check-in to the Emergency Department and triage nurse.  Should you have questions after your visit or need to cancel or reschedule your appointment, please contact [facility] CENTER AT [facility] [phone]  and follow the prompts.  Office hours are 8:00 a.m. to 4:30 p.m. Monday - Friday. Please note that voicemails left after 4:00 p.m. may not be returned until the following business day.  We are closed weekends and major holidays. You have access to a nurse at all times for urgent questions. Please call the main number to the clinic [phone] and follow the prompts.  For any non-urgent questions, you may also contact your provider using MyChart. We now offer e-Visits for anyone 65 and older to request care online for non-urgent symptoms. For details visit [email or web address].   Also download the MyChart app! Go to the app store, search "MyChart", open the app, select [place], and log in with your MyChart username and password.

## 2022-07-02 NOTE — Patient Instructions (Signed)
[facility] at [facility] Discharge Instructions   You were seen and examined today by Dr. Katragadda.  He reviewed the results of your lab work which are normal/stable.   We will proceed with your treatment today.  Return as scheduled.    Thank you for choosing [facility] at [facility] to provide your oncology and hematology care.  To afford each patient quality time with our provider, please arrive at least 15 minutes before your scheduled appointment time.   If you have a lab appointment with the Cancer Center please come in thru the Main Entrance and check in at the main information desk.  You need to re-schedule your appointment should you arrive 10 or more minutes late.  We strive to give you quality time with our providers, and arriving late affects you and other patients whose appointments are after yours.  Also, if you no show three or more times for appointments you may be dismissed from the clinic at the providers discretion.     Again, thank you for choosing [facility].  Our hope is that these requests will decrease the amount of time that you wait before being seen by our physicians.       _____________________________________________________________  Should you have questions after your visit to [facility], please contact our office at [phone] and follow the prompts.  Our office hours are 8:00 a.m. and 4:30 p.m. Monday - Friday.  Please note that voicemails left after 4:00 p.m. may not be returned until the following business day.  We are closed weekends and major holidays.  You do have access to a nurse 24-7, just call the main number to the clinic [phone] and do not press any options, hold on the line and a nurse will answer the phone.    For prescription refill requests, have your pharmacy contact our office and allow 72 hours.    Due to Covid, you will need to wear a mask upon entering  the hospital. If you do not have a mask, a mask will be given to you at the Main Entrance upon arrival. For doctor visits, patients may have 1 support person age 18 or older with them. For treatment visits, patients can not have anyone with them due to social distancing guidelines and our immunocompromised population.      

## 2022-07-03 ENCOUNTER — Other Ambulatory Visit: Payer: Self-pay

## 2022-07-04 ENCOUNTER — Inpatient Hospital Stay: Payer: Medicare HMO

## 2022-07-04 VITALS — BP 143/94 | HR 91 | Temp 99.3°F | Resp 18

## 2022-07-04 DIAGNOSIS — M25569 Pain in unspecified knee: Secondary | ICD-10-CM | POA: Diagnosis not present

## 2022-07-04 DIAGNOSIS — Z5112 Encounter for antineoplastic immunotherapy: Secondary | ICD-10-CM | POA: Diagnosis not present

## 2022-07-04 DIAGNOSIS — C779 Secondary and unspecified malignant neoplasm of lymph node, unspecified: Secondary | ICD-10-CM | POA: Diagnosis not present

## 2022-07-04 DIAGNOSIS — D649 Anemia, unspecified: Secondary | ICD-10-CM | POA: Diagnosis not present

## 2022-07-04 DIAGNOSIS — Z5111 Encounter for antineoplastic chemotherapy: Secondary | ICD-10-CM | POA: Diagnosis not present

## 2022-07-04 DIAGNOSIS — G35 Multiple sclerosis: Secondary | ICD-10-CM | POA: Diagnosis not present

## 2022-07-04 DIAGNOSIS — C20 Malignant neoplasm of rectum: Secondary | ICD-10-CM | POA: Diagnosis not present

## 2022-07-04 DIAGNOSIS — C7951 Secondary malignant neoplasm of bone: Secondary | ICD-10-CM | POA: Diagnosis not present

## 2022-07-04 DIAGNOSIS — M25551 Pain in right hip: Secondary | ICD-10-CM | POA: Diagnosis not present

## 2022-07-04 LAB — CEA: CEA: 10.2 ng/mL — ABNORMAL HIGH (ref 0.0–4.7)

## 2022-07-04 MED ORDER — HEPARIN SOD (PORK) LOCK FLUSH 100 UNIT/ML IV SOLN
500.0000 [IU] | Freq: Once | INTRAVENOUS | Status: AC | PRN
  Administered 2022-07-04: 500 [IU]

## 2022-07-04 MED ORDER — SODIUM CHLORIDE 0.9% FLUSH
10.0000 mL | INTRAVENOUS | Status: DC | PRN
  Administered 2022-07-04: 10 mL

## 2022-07-04 NOTE — Progress Notes (Signed)
Patient presents today for pump d/c. Vital signs are stable. Port a cath site clean, dry, and intact. Port flushed with 10 mls of Normal Saline and 500 Units of Heparin. Needle removed intact. Band aid applied. Patient has complaints about right leg pain that radiates down his leg, back of the thigh. Message sent to Dr. Theo Dills pertaining to pain medcation for home is not helping per patient's words. Orders received from Dr. Ellin Saba . Increase Gabapentin to 600 mg qhs . Patient instructed and teaching performed . Understanding verbalized. Discharged from clinic by wheel chair and in stable condition. Patient alert and oriented.

## 2022-07-04 NOTE — Patient Instructions (Signed)
[facility]  Discharge Instructions: Thank you for choosing [facility] to provide your oncology and hematology care.  If you have a lab appointment with the Cancer Center - please note that after April 8th, 2024, all labs will be drawn in the cancer center.  You do not have to check in or register with the main entrance as you have in the past but will complete your check-in in the cancer center.  Wear comfortable clothing and clothing appropriate for easy access to any Portacath or PICC line.   We strive to give you quality time with your provider. You may need to reschedule your appointment if you arrive late (15 or more minutes).  Arriving late affects you and other patients whose appointments are after yours.  Also, if you miss three or more appointments without notifying the office, you may be dismissed from the clinic at the provider's discretion.      For prescription refill requests, have your pharmacy contact our office and allow 72 hours for refills to be completed.    Today you received the following chemotherapy and/or immunotherapy agents 5FU pump d/c.       To help prevent nausea and vomiting after your treatment, we encourage you to take your nausea medication as directed.  BELOW ARE SYMPTOMS THAT SHOULD BE REPORTED IMMEDIATELY: *FEVER GREATER THAN 100.4 F (38 C) OR HIGHER *CHILLS OR SWEATING *NAUSEA AND VOMITING THAT IS NOT CONTROLLED WITH YOUR NAUSEA MEDICATION *UNUSUAL SHORTNESS OF BREATH *UNUSUAL BRUISING OR BLEEDING *URINARY PROBLEMS (pain or burning when urinating, or frequent urination) *BOWEL PROBLEMS (unusual diarrhea, constipation, pain near the anus) TENDERNESS IN MOUTH AND THROAT WITH OR WITHOUT PRESENCE OF ULCERS (sore throat, sores in mouth, or a toothache) UNUSUAL RASH, SWELLING OR PAIN  UNUSUAL VAGINAL DISCHARGE OR ITCHING   Items with * indicate a potential emergency and should be followed up as soon as possible or go to  the Emergency Department if any problems should occur.  Please show the CHEMOTHERAPY ALERT CARD or IMMUNOTHERAPY ALERT CARD at check-in to the Emergency Department and triage nurse.  Should you have questions after your visit or need to cancel or reschedule your appointment, please contact [facility] CENTER AT [facility] [phone]  and follow the prompts.  Office hours are 8:00 a.m. to 4:30 p.m. Monday - Friday. Please note that voicemails left after 4:00 p.m. may not be returned until the following business day.  We are closed weekends and major holidays. You have access to a nurse at all times for urgent questions. Please call the main number to the clinic [phone] and follow the prompts.  For any non-urgent questions, you may also contact your provider using MyChart. We now offer e-Visits for anyone 97 and older to request care online for non-urgent symptoms. For details visit [email or web address].   Also download the MyChart app! Go to the app store, search "MyChart", open the app, select [place], and log in with your MyChart username and password.

## 2022-07-05 DIAGNOSIS — C189 Malignant neoplasm of colon, unspecified: Secondary | ICD-10-CM | POA: Diagnosis not present

## 2022-07-05 DIAGNOSIS — C7951 Secondary malignant neoplasm of bone: Secondary | ICD-10-CM | POA: Diagnosis not present

## 2022-07-05 DIAGNOSIS — M1990 Unspecified osteoarthritis, unspecified site: Secondary | ICD-10-CM | POA: Diagnosis not present

## 2022-07-05 DIAGNOSIS — M84521D Pathological fracture in neoplastic disease, right humerus, subsequent encounter for fracture with routine healing: Secondary | ICD-10-CM | POA: Diagnosis not present

## 2022-07-05 DIAGNOSIS — Z9181 History of falling: Secondary | ICD-10-CM | POA: Diagnosis not present

## 2022-07-10 ENCOUNTER — Emergency Department (HOSPITAL_COMMUNITY): Payer: Medicare HMO

## 2022-07-10 ENCOUNTER — Emergency Department (HOSPITAL_COMMUNITY)
Admission: EM | Admit: 2022-07-10 | Discharge: 2022-07-10 | Disposition: A | Discharge status: Home / Self Care | Payer: Medicare HMO | Attending: Emergency Medicine | Admitting: Emergency Medicine

## 2022-07-10 ENCOUNTER — Encounter (HOSPITAL_COMMUNITY): Payer: Self-pay | Admitting: Emergency Medicine

## 2022-07-10 ENCOUNTER — Other Ambulatory Visit: Payer: Self-pay

## 2022-07-10 DIAGNOSIS — Z85048 Personal history of other malignant neoplasm of rectum, rectosigmoid junction, and anus: Secondary | ICD-10-CM | POA: Insufficient documentation

## 2022-07-10 DIAGNOSIS — E119 Type 2 diabetes mellitus without complications: Secondary | ICD-10-CM | POA: Insufficient documentation

## 2022-07-10 DIAGNOSIS — M79605 Pain in left leg: Secondary | ICD-10-CM

## 2022-07-10 DIAGNOSIS — Z96642 Presence of left artificial hip joint: Secondary | ICD-10-CM | POA: Diagnosis not present

## 2022-07-10 DIAGNOSIS — I10 Essential (primary) hypertension: Secondary | ICD-10-CM | POA: Insufficient documentation

## 2022-07-10 DIAGNOSIS — M545 Low back pain, unspecified: Secondary | ICD-10-CM | POA: Diagnosis not present

## 2022-07-10 DIAGNOSIS — M62838 Other muscle spasm: Secondary | ICD-10-CM

## 2022-07-10 MED ORDER — TIZANIDINE HCL 4 MG PO TABS: 4.0000 mg | ORAL_TABLET | Freq: Three times a day (TID) | ORAL | 0 refills | Status: DC

## 2022-07-10 MED ORDER — FENTANYL CITRATE PF 50 MCG/ML IJ SOSY
50.0000 ug | PREFILLED_SYRINGE | Freq: Once | INTRAMUSCULAR | Status: AC
  Administered 2022-07-10: 50 ug via INTRAMUSCULAR
  Filled 2022-07-10: qty 1

## 2022-07-10 NOTE — Discharge Instructions (Addendum)
Take the muscle relaxer as prescribed.  Stop taking the other muscle relaxers.  Please follow-up with your primary care provider or with your oncologist

## 2022-07-10 NOTE — ED Notes (Signed)
Patient transported to X-ray 

## 2022-07-10 NOTE — ED Notes (Signed)
Pt given graham crackers, peanut butter and soda.

## 2022-07-10 NOTE — ED Triage Notes (Signed)
Pt c/o left leg pain from ankle to buttock, sharp/shooting spasms. Hx sciatica and states this feels similar. No meds PTA.

## 2022-07-11 ENCOUNTER — Ambulatory Visit (HOSPITAL_COMMUNITY)
Admission: RE | Admit: 2022-07-11 | Discharge: 2022-07-11 | Disposition: A | Discharge status: Home / Self Care | Payer: Medicare HMO | Source: Ambulatory Visit | Attending: Hematology | Admitting: Hematology

## 2022-07-11 DIAGNOSIS — C20 Malignant neoplasm of rectum: Secondary | ICD-10-CM | POA: Insufficient documentation

## 2022-07-11 DIAGNOSIS — C7951 Secondary malignant neoplasm of bone: Secondary | ICD-10-CM | POA: Diagnosis not present

## 2022-07-11 MED ORDER — FLUDEOXYGLUCOSE F - 18 (FDG) INJECTION
11.4500 | Freq: Once | INTRAVENOUS | Status: AC | PRN
  Administered 2022-07-11: 11.45 via INTRAVENOUS

## 2022-07-12 DIAGNOSIS — C189 Malignant neoplasm of colon, unspecified: Secondary | ICD-10-CM | POA: Diagnosis not present

## 2022-07-12 DIAGNOSIS — C7951 Secondary malignant neoplasm of bone: Secondary | ICD-10-CM | POA: Diagnosis not present

## 2022-07-12 DIAGNOSIS — M84521D Pathological fracture in neoplastic disease, right humerus, subsequent encounter for fracture with routine healing: Secondary | ICD-10-CM | POA: Diagnosis not present

## 2022-07-12 DIAGNOSIS — M1990 Unspecified osteoarthritis, unspecified site: Secondary | ICD-10-CM | POA: Diagnosis not present

## 2022-07-12 DIAGNOSIS — Z9181 History of falling: Secondary | ICD-10-CM | POA: Diagnosis not present

## 2022-07-14 NOTE — ED Provider Notes (Signed)
[place] EMERGENCY DEPARTMENT AT [facility] Provider Note   CSN: [id number] Arrival date & time: 07/10/22  1038     History  Chief Complaint  Patient presents with   Leg Pain    Joseph Hogan is a 62 y.o. male.   Leg Pain Associated symptoms: back pain   Associated symptoms: no fever and no neck pain        Joseph Hogan is a 62 y.o. male with past medical history of hypertension, rectal carcinoma with bony metastasis, type 2 diabetes, bilateral knee and back pain who presents to the Emergency Department complaining of pain of his left low back, buttock, with pain radiating into his entire left leg.  Describes pain as sharp and stabbing worse with movement and improves somewhat at rest.  Has history of recurrent sciatica and had similar symptoms in the right leg previously.  Had DVT study and x-ray of right hip in late April that was negative.  States that his current symptoms are similar to those in April.  Known injury, numbness or weakness of the left lower extremity, swelling of the calf or redness, abdominal pain, fever or chills.  No saddle anesthesias.    Home Medications Prior to Admission medications   Medication Sig Start Date End Date Taking? Authorizing Provider  tiZANidine (ZANAFLEX) 4 MG tablet Take 1 tablet (4 mg total) by mouth 3 (three) times daily. 07/10/22  Yes Kharisma Glasner, PA-C  b complex vitamins capsule Take 1 capsule by mouth daily.    [provider]  fluconazole (DIFLUCAN) 150 MG tablet Take 1 tablet (150 mg total) by mouth once a week. 05/29/22   Erven Colla, PA-C  gabapentin (NEURONTIN) 300 MG capsule Take 1 capsule (300 mg total) by mouth at bedtime. 06/06/22   [name], MD  HYDROmorphone (DILAUDID) 2 MG tablet Take 1 tablet (2 mg total) by mouth every 4 (four) hours as needed for severe pain. 07/02/22   Doreatha Massed, MD  lidocaine-prilocaine (EMLA) cream Apply a quarter-sized amount to port a cath site and  cover with plastic wrap one hour prior to infusion appointments 07/02/22   Doreatha Massed, MD  Multiple Vitamin (MULTIVITAMIN WITH MINERALS) TABS tablet Take 1 tablet by mouth daily.    [provider]  ondansetron (ZOFRAN) 8 MG tablet Take 11.5 tablets (92 mg total) by mouth every 8 (eight) hours as needed for nausea or vomiting. Patient taking differently: Take 8-16 mg by mouth every 8 (eight) hours as needed for nausea or vomiting. 03/16/22   Briant Cedar, PA-C  oxyCODONE ER (XTAMPZA ER) 13.5 MG C12A Take 13.5 mg by mouth every 8 (eight) hours as needed. 04/25/22   Briant Cedar, PA-C  prochlorperazine (COMPAZINE) 10 MG tablet Take 1 tablet (10 mg total) by mouth every 6 (six) hours as needed for nausea or vomiting. 03/16/22   Briant Cedar, PA-C  VITAMIN A PO Take 1 tablet by mouth daily.    [provider]      Allergies    Codeine and Lisinopril    Review of Systems   Review of Systems  Constitutional:  Negative for chills and fever.  Eyes:  Negative for visual disturbance.  Respiratory:  Negative for shortness of breath.   Cardiovascular:  Negative for chest pain.  Gastrointestinal:  Negative for abdominal pain, nausea and vomiting.  Genitourinary:  Negative for difficulty urinating, dysuria, flank pain, penile swelling, scrotal swelling and testicular pain.  Musculoskeletal:  Positive for arthralgias (Left leg pain, left buttock pain) and back pain. Negative for neck pain.  Skin:  Negative for color change, rash and wound.  Neurological:  Negative for syncope, weakness and numbness.    Physical Exam Updated Vital Signs BP (!) 168/101   Pulse 94   Temp 98.1 F (36.7 C) (Oral)   Resp 16   Ht 5\' 11"  (1.803 m)   Wt 94.3 kg   SpO2 99%   BMI 29.01 kg/m  Physical Exam Vitals and nursing note reviewed.  Constitutional:      General: He is not in acute distress.    Appearance: Normal appearance. He is not ill-appearing or toxic-appearing.   Cardiovascular:     Rate and Rhythm: Normal rate and regular rhythm.     Pulses: Normal pulses.  Pulmonary:     Effort: Pulmonary effort is normal.  Abdominal:     Palpations: Abdomen is soft.     Tenderness: There is no abdominal tenderness.  Musculoskeletal:        General: Tenderness present. No swelling, deformity or signs of injury.     Right lower leg: No edema.     Left lower leg: No edema.     Comments: Tender to palpation left lower lumbar paraspinal muscles and left SI joint space.  Positive left straight leg raise.  Hip flexors and extensors are intact.  No pain edema, or erythema of the left calf  Skin:    General: Skin is warm.     Capillary Refill: Capillary refill takes less than 2 seconds.  Neurological:     General: No focal deficit present.     Mental Status: He is alert.     Sensory: Sensation is intact. No sensory deficit.     Motor: Motor function is intact. No weakness.     Coordination: Coordination is intact.     Gait: Gait is intact.     Deep Tendon Reflexes:     Reflex Scores:      Patellar reflexes are 2+ on the right side and 2+ on the left side.      Achilles reflexes are 2+ on the right side and 2+ on the left side.    ED Results / Procedures / Treatments   Labs (all labs ordered are listed, but only abnormal results are displayed) Labs Reviewed - No data to display  EKG None  Radiology No results found.  Procedures Procedures    Medications Ordered in ED Medications  fentaNYL (SUBLIMAZE) injection 50 mcg (50 mcg Intramuscular Given 07/10/22 1250)    ED Course/ Medical Decision Making/ A&P                             Medical Decision Making Patient here with complaint of left low back and left leg pain.  Has history of sciatica and back pain.  Also history of carcinoma, currently takes oral hydromorphone tablets without relief of symptoms.  Here late April with similar symptoms on the right side had negative DVT study and hip  x-ray.  Differential would include but not limited to sciatica, acute on chronic pain, pain secondary to bony mets, cauda equina, degenerative disc disease  Amount and/or Complexity of Data Reviewed Radiology: ordered.    Details: No clinical findings to warrant need for DVT study of the left lower extremity.    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  There is lytic  and sclerotic lesions of the superior pubic ramus that appear unchanged from recent PET scan Discussion of management or test interpretation with external provider(s): I suspect back hip and leg pain secondary to bony mets, patient has hydromorphone tablets at home, database was reviewed and he received recent prescription refill.  Will try muscle relaxer.  Recommended over-the-counter lidocaine patches for symptomatic relief as well.  He appears appropriate for discharge home, will follow-up closely with oncology.  Risk Prescription drug management.           Final Clinical Impression(s) / ED Diagnoses Final diagnoses:  Left leg pain  Muscle spasm of left lower extremity    Rx / DC Orders ED Discharge Orders          Ordered    tiZANidine (ZANAFLEX) 4 MG tablet  3 times daily        07/10/22 1532              Pauline Aus, PA-C 07/14/22 1508    Bethann Berkshire, MD 07/18/22 1117

## 2022-07-15 ENCOUNTER — Encounter: Payer: Self-pay | Admitting: Physician Assistant

## 2022-07-15 DIAGNOSIS — C7951 Secondary malignant neoplasm of bone: Secondary | ICD-10-CM | POA: Diagnosis not present

## 2022-07-15 DIAGNOSIS — Z9181 History of falling: Secondary | ICD-10-CM | POA: Diagnosis not present

## 2022-07-15 DIAGNOSIS — C189 Malignant neoplasm of colon, unspecified: Secondary | ICD-10-CM | POA: Diagnosis not present

## 2022-07-15 DIAGNOSIS — M1990 Unspecified osteoarthritis, unspecified site: Secondary | ICD-10-CM | POA: Diagnosis not present

## 2022-07-15 DIAGNOSIS — M84521D Pathological fracture in neoplastic disease, right humerus, subsequent encounter for fracture with routine healing: Secondary | ICD-10-CM | POA: Diagnosis not present

## 2022-07-16 ENCOUNTER — Other Ambulatory Visit: Payer: Self-pay

## 2022-07-16 DIAGNOSIS — C20 Malignant neoplasm of rectum: Secondary | ICD-10-CM

## 2022-07-17 ENCOUNTER — Telehealth: Payer: Self-pay | Admitting: Hematology and Oncology

## 2022-07-17 ENCOUNTER — Inpatient Hospital Stay: Payer: Medicare HMO

## 2022-07-17 ENCOUNTER — Inpatient Hospital Stay: Payer: Medicare HMO | Admitting: Hematology

## 2022-07-19 ENCOUNTER — Inpatient Hospital Stay: Payer: Medicare HMO

## 2022-07-22 ENCOUNTER — Telehealth: Payer: Self-pay | Admitting: *Deleted

## 2022-07-22 NOTE — Telephone Encounter (Signed)
Received call from pt wanting Dr. Leonides Schanz to know that he has been having muscle cramps in his legs at night. Pt has an appt to see him tomorrow. Pt is aware of this appt.

## 2022-07-23 ENCOUNTER — Emergency Department (HOSPITAL_COMMUNITY): Payer: Medicare HMO

## 2022-07-23 ENCOUNTER — Encounter: Payer: Self-pay | Admitting: Hematology and Oncology

## 2022-07-23 ENCOUNTER — Inpatient Hospital Stay: Payer: Medicare HMO | Attending: Radiation Oncology | Admitting: Hematology and Oncology

## 2022-07-23 ENCOUNTER — Other Ambulatory Visit: Payer: Self-pay

## 2022-07-23 ENCOUNTER — Inpatient Hospital Stay: Payer: Medicare HMO

## 2022-07-23 ENCOUNTER — Encounter (HOSPITAL_COMMUNITY): Payer: Self-pay

## 2022-07-23 ENCOUNTER — Emergency Department (HOSPITAL_COMMUNITY)
Admission: EM | Admit: 2022-07-23 | Discharge: 2022-07-23 | Disposition: A | Discharge status: Home / Self Care | Payer: Medicare HMO | Attending: Emergency Medicine | Admitting: Emergency Medicine

## 2022-07-23 ENCOUNTER — Other Ambulatory Visit: Payer: Self-pay | Admitting: Hematology and Oncology

## 2022-07-23 VITALS — BP 148/89 | HR 90 | Temp 97.6°F | Resp 16 | Wt 198.3 lb

## 2022-07-23 DIAGNOSIS — Z95828 Presence of other vascular implants and grafts: Secondary | ICD-10-CM

## 2022-07-23 DIAGNOSIS — M898X5 Other specified disorders of bone, thigh: Secondary | ICD-10-CM

## 2022-07-23 DIAGNOSIS — N3289 Other specified disorders of bladder: Secondary | ICD-10-CM | POA: Diagnosis not present

## 2022-07-23 DIAGNOSIS — S0990XA Unspecified injury of head, initial encounter: Secondary | ICD-10-CM | POA: Diagnosis not present

## 2022-07-23 DIAGNOSIS — C20 Malignant neoplasm of rectum: Secondary | ICD-10-CM

## 2022-07-23 DIAGNOSIS — D649 Anemia, unspecified: Secondary | ICD-10-CM | POA: Diagnosis not present

## 2022-07-23 DIAGNOSIS — M899 Disorder of bone, unspecified: Secondary | ICD-10-CM

## 2022-07-23 DIAGNOSIS — M25552 Pain in left hip: Secondary | ICD-10-CM | POA: Insufficient documentation

## 2022-07-23 DIAGNOSIS — M25551 Pain in right hip: Secondary | ICD-10-CM | POA: Insufficient documentation

## 2022-07-23 DIAGNOSIS — M25511 Pain in right shoulder: Secondary | ICD-10-CM | POA: Insufficient documentation

## 2022-07-23 DIAGNOSIS — Z5111 Encounter for antineoplastic chemotherapy: Secondary | ICD-10-CM | POA: Insufficient documentation

## 2022-07-23 DIAGNOSIS — S199XXA Unspecified injury of neck, initial encounter: Secondary | ICD-10-CM | POA: Diagnosis not present

## 2022-07-23 DIAGNOSIS — D509 Iron deficiency anemia, unspecified: Secondary | ICD-10-CM | POA: Insufficient documentation

## 2022-07-23 DIAGNOSIS — W19XXXA Unspecified fall, initial encounter: Secondary | ICD-10-CM

## 2022-07-23 DIAGNOSIS — W01198A Fall on same level from slipping, tripping and stumbling with subsequent striking against other object, initial encounter: Secondary | ICD-10-CM | POA: Insufficient documentation

## 2022-07-23 DIAGNOSIS — C7951 Secondary malignant neoplasm of bone: Secondary | ICD-10-CM | POA: Diagnosis not present

## 2022-07-23 DIAGNOSIS — D75839 Thrombocytosis, unspecified: Secondary | ICD-10-CM | POA: Insufficient documentation

## 2022-07-23 DIAGNOSIS — M545 Low back pain, unspecified: Secondary | ICD-10-CM | POA: Diagnosis not present

## 2022-07-23 DIAGNOSIS — C801 Malignant (primary) neoplasm, unspecified: Secondary | ICD-10-CM | POA: Diagnosis not present

## 2022-07-23 DIAGNOSIS — I10 Essential (primary) hypertension: Secondary | ICD-10-CM | POA: Insufficient documentation

## 2022-07-23 DIAGNOSIS — E119 Type 2 diabetes mellitus without complications: Secondary | ICD-10-CM | POA: Insufficient documentation

## 2022-07-23 DIAGNOSIS — Z79899 Other long term (current) drug therapy: Secondary | ICD-10-CM | POA: Insufficient documentation

## 2022-07-23 DIAGNOSIS — Z5112 Encounter for antineoplastic immunotherapy: Secondary | ICD-10-CM | POA: Insufficient documentation

## 2022-07-23 LAB — CBC WITH DIFFERENTIAL/PLATELET
Abs Immature Granulocytes: 0.03 10*3/uL (ref 0.00–0.07)
Basophils Absolute: 0 10*3/uL (ref 0.0–0.1)
Basophils Relative: 1 %
Eosinophils Absolute: 0.1 10*3/uL (ref 0.0–0.5)
Eosinophils Relative: 2 %
HCT: 30 % — ABNORMAL LOW (ref 39.0–52.0)
Hemoglobin: 9.7 g/dL — ABNORMAL LOW (ref 13.0–17.0)
Immature Granulocytes: 1 %
Lymphocytes Relative: 9 %
Lymphs Abs: 0.3 10*3/uL — ABNORMAL LOW (ref 0.7–4.0)
MCH: 28.7 pg (ref 26.0–34.0)
MCHC: 32.3 g/dL (ref 30.0–36.0)
MCV: 88.8 fL (ref 80.0–100.0)
Monocytes Absolute: 0.6 10*3/uL (ref 0.1–1.0)
Monocytes Relative: 16 %
Neutro Abs: 2.6 10*3/uL (ref 1.7–7.7)
Neutrophils Relative %: 71 %
Platelets: 268 10*3/uL (ref 150–400)
RBC: 3.38 MIL/uL — ABNORMAL LOW (ref 4.22–5.81)
RDW: 16.4 % — ABNORMAL HIGH (ref 11.5–15.5)
WBC: 3.7 10*3/uL — ABNORMAL LOW (ref 4.0–10.5)
nRBC: 0 % (ref 0.0–0.2)

## 2022-07-23 LAB — CBC WITH DIFFERENTIAL (CANCER CENTER ONLY)
Abs Immature Granulocytes: 0.03 10*3/uL (ref 0.00–0.07)
Basophils Absolute: 0 10*3/uL (ref 0.0–0.1)
Basophils Relative: 1 %
Eosinophils Absolute: 0.1 10*3/uL (ref 0.0–0.5)
Eosinophils Relative: 2 %
HCT: 31.7 % — ABNORMAL LOW (ref 39.0–52.0)
Hemoglobin: 10.4 g/dL — ABNORMAL LOW (ref 13.0–17.0)
Immature Granulocytes: 1 %
Lymphocytes Relative: 8 %
Lymphs Abs: 0.3 10*3/uL — ABNORMAL LOW (ref 0.7–4.0)
MCH: 28.4 pg (ref 26.0–34.0)
MCHC: 32.8 g/dL (ref 30.0–36.0)
MCV: 86.6 fL (ref 80.0–100.0)
Monocytes Absolute: 0.6 10*3/uL (ref 0.1–1.0)
Monocytes Relative: 16 %
Neutro Abs: 2.8 10*3/uL (ref 1.7–7.7)
Neutrophils Relative %: 72 %
Platelet Count: 289 10*3/uL (ref 150–400)
RBC: 3.66 MIL/uL — ABNORMAL LOW (ref 4.22–5.81)
RDW: 16.5 % — ABNORMAL HIGH (ref 11.5–15.5)
WBC Count: 3.9 10*3/uL — ABNORMAL LOW (ref 4.0–10.5)
nRBC: 0 % (ref 0.0–0.2)

## 2022-07-23 LAB — BASIC METABOLIC PANEL
Anion gap: 11 (ref 5–15)
BUN: 17 mg/dL (ref 8–23)
CO2: 23 mmol/L (ref 22–32)
Calcium: 8.8 mg/dL — ABNORMAL LOW (ref 8.9–10.3)
Chloride: 101 mmol/L (ref 98–111)
Creatinine, Ser: 0.44 mg/dL — ABNORMAL LOW (ref 0.61–1.24)
GFR, Estimated: >60 mL/min (ref >60)
Glucose, Bld: 134 mg/dL — ABNORMAL HIGH (ref 70–99)
Potassium: 3.7 mmol/L (ref 3.5–5.1)
Sodium: 135 mmol/L (ref 135–145)

## 2022-07-23 LAB — CMP (CANCER CENTER ONLY)
ALT: 15 U/L (ref 0–44)
AST: 14 U/L — ABNORMAL LOW (ref 15–41)
Albumin: 3.5 g/dL (ref 3.5–5.0)
Alkaline Phosphatase: 121 U/L (ref 38–126)
Anion gap: 8 (ref 5–15)
BUN: 17 mg/dL (ref 8–23)
CO2: 26 mmol/L (ref 22–32)
Calcium: 9.2 mg/dL (ref 8.9–10.3)
Chloride: 102 mmol/L (ref 98–111)
Creatinine: 0.5 mg/dL — ABNORMAL LOW (ref 0.61–1.24)
GFR, Estimated: >60 mL/min (ref >60)
Glucose, Bld: 166 mg/dL — ABNORMAL HIGH (ref 70–99)
Potassium: 3.9 mmol/L (ref 3.5–5.1)
Sodium: 136 mmol/L (ref 135–145)
Total Bilirubin: 0.5 mg/dL (ref 0.3–1.2)
Total Protein: 7.5 g/dL (ref 6.5–8.1)

## 2022-07-23 LAB — TOTAL PROTEIN, URINE DIPSTICK: Protein, ur: NEGATIVE mg/dL

## 2022-07-23 LAB — LACTATE DEHYDROGENASE: LDH: 427 U/L — ABNORMAL HIGH (ref 98–192)

## 2022-07-23 LAB — MAGNESIUM: Magnesium: 2 mg/dL (ref 1.7–2.4)

## 2022-07-23 MED ORDER — XTAMPZA ER 13.5 MG PO C12A: 13.5000 mg | EXTENDED_RELEASE_CAPSULE | Freq: Two times a day (BID) | ORAL | 0 refills | Status: DC

## 2022-07-23 MED ORDER — HYDROMORPHONE HCL 1 MG/ML IJ SOLN
2.0000 mg | Freq: Once | INTRAMUSCULAR | Status: AC
  Administered 2022-07-23: 2 mg via INTRAVENOUS
  Filled 2022-07-23: qty 2

## 2022-07-23 MED ORDER — OXYCODONE HCL 5 MG PO TABS
10.0000 mg | ORAL_TABLET | Freq: Once | ORAL | Status: AC
  Administered 2022-07-23: 10 mg via ORAL
  Filled 2022-07-23: qty 2

## 2022-07-23 MED ORDER — SODIUM CHLORIDE 0.9% FLUSH
10.0000 mL | Freq: Once | INTRAVENOUS | Status: AC
  Administered 2022-07-23: 10 mL

## 2022-07-23 MED ORDER — OXYCODONE HCL 5 MG PO TABS: 5.0000 mg | ORAL_TABLET | Freq: Four times a day (QID) | ORAL | 0 refills | Status: DC | PRN

## 2022-07-23 MED ORDER — HEPARIN SOD (PORK) LOCK FLUSH 100 UNIT/ML IV SOLN
500.0000 [IU] | Freq: Once | INTRAVENOUS | Status: AC
  Administered 2022-07-23: 500 [IU]

## 2022-07-23 MED ORDER — HYDROMORPHONE HCL 1 MG/ML IJ SOLN
1.0000 mg | Freq: Once | INTRAMUSCULAR | Status: AC
  Administered 2022-07-23: 1 mg via INTRAVENOUS
  Filled 2022-07-23: qty 1

## 2022-07-23 MED ORDER — HEPARIN SOD (PORK) LOCK FLUSH 100 UNIT/ML IV SOLN
INTRAVENOUS | Status: AC
  Administered 2022-07-23: 500 [IU]
  Filled 2022-07-23: qty 5

## 2022-07-23 NOTE — Discharge Instructions (Addendum)
Please follow-up with your primary care doctor, and oncologist.  Your scans today were unremarkable, for any kind of acute fracture or head bleed.  Make sure you are using your walker at home, to help.  Additionally I prescribed you a short dose of pain medications, to help with your pain control.  Be careful as this may cause more drowsiness.  Return to the ER if you have any confusion, intractable nausea, vomiting, or unable to walk or fall again.

## 2022-07-23 NOTE — ED Notes (Signed)
Fall week ago pain all over, was at cancer center today and they said he needed to be evaluated

## 2022-07-23 NOTE — ED Provider Notes (Signed)
[facility] AT [facility] Provider Note   CSN: [id number] Arrival date & time: 07/23/22  1123     History  Chief Complaint  Patient presents with   Joseph Hogan is a 62 y.o. male with a past medical history significant for metastatic rectal cancer with bone lesions, hyperlipidemia, hypertension, and diabetes who presents to the ED after a mechanical fall.  Patient notes he fell 2 weeks ago after his PET scan.  He notes he felt drowsy from the medication from the scan which caused him to fall.  Patient landed on his left side.  Admits to hitting his head.  No LOC.  Not on any blood thinners.  Patient admits to a persistent pain "all over".  He notes pain is worse in the left and right hip.  Previous left femoral neck fracture with underlying lytic lesion where he underwent left total hip arthroplasty.  Patient was given Dilaudid at cancer center prior to arrival so patient is unsure exactly where his significant pain is.  Denies headache.  No visual changes or speech changes.  Denies unilateral weakness.  History obtained from patient and past medical records. No interpreter used during encounter.       Home Medications Prior to Admission medications   Medication Sig Start Date End Date Taking? Authorizing Provider  b complex vitamins capsule Take 1 capsule by mouth daily.    [provider]  fluconazole (DIFLUCAN) 150 MG tablet Take 1 tablet (150 mg total) by mouth once a week. 05/29/22   Erven Colla, PA-C  gabapentin (NEURONTIN) 300 MG capsule Take 1 capsule (300 mg total) by mouth at bedtime. 06/06/22   [name], MD  HYDROmorphone (DILAUDID) 2 MG tablet Take 1 tablet (2 mg total) by mouth every 4 (four) hours as needed for severe pain. 07/02/22   Doreatha Massed, MD  lidocaine-prilocaine (EMLA) cream Apply a quarter-sized amount to port a cath site and cover with plastic wrap one hour prior to infusion appointments 07/02/22    Doreatha Massed, MD  Multiple Vitamin (MULTIVITAMIN WITH MINERALS) TABS tablet Take 1 tablet by mouth daily.    [provider]  ondansetron (ZOFRAN) 8 MG tablet Take 11.5 tablets (92 mg total) by mouth every 8 (eight) hours as needed for nausea or vomiting. Patient taking differently: Take 8-16 mg by mouth every 8 (eight) hours as needed for nausea or vomiting. 03/16/22   Briant Cedar, PA-C  oxyCODONE ER (XTAMPZA ER) 13.5 MG C12A Take 13.5 mg by mouth every 8 (eight) hours as needed. 04/25/22   Briant Cedar, PA-C  prochlorperazine (COMPAZINE) 10 MG tablet Take 1 tablet (10 mg total) by mouth every 6 (six) hours as needed for nausea or vomiting. 03/16/22   Briant Cedar, PA-C  tiZANidine (ZANAFLEX) 4 MG tablet Take 1 tablet (4 mg total) by mouth 3 (three) times daily. 07/10/22   Triplett, Tammy, PA-C  VITAMIN A PO Take 1 tablet by mouth daily.    [provider]      Allergies    Codeine and Lisinopril    Review of Systems   Review of Systems  Constitutional:  Negative for chills and fever.  Respiratory:  Negative for shortness of breath.   Cardiovascular:  Negative for chest pain.  Musculoskeletal:  Positive for arthralgias and back pain. Negative for neck pain.    Physical Exam Updated Vital Signs BP (!) 141/87   Pulse 70  Temp 97.7 F (36.5 C)   Resp 18   SpO2 98%  Physical Exam Vitals and nursing note reviewed.  Constitutional:      General: He is not in acute distress.    Appearance: He is not ill-appearing.  HENT:     Head: Normocephalic.  Eyes:     Pupils: Pupils are equal, round, and reactive to light.  Cardiovascular:     Rate and Rhythm: Normal rate and regular rhythm.     Pulses: Normal pulses.     Heart sounds: Normal heart sounds. No murmur heard.    No friction rub. No gallop.  Pulmonary:     Effort: Pulmonary effort is normal.     Breath sounds: Normal breath sounds.  Abdominal:     General: Abdomen is flat. There is no  distension.     Palpations: Abdomen is soft.     Tenderness: There is no abdominal tenderness. There is no guarding or rebound.  Musculoskeletal:        General: Normal range of motion.     Cervical back: Neck supple.     Comments: Bony tenderness at right hip. Some tenderness to left hip. Bilateral lower extremities neurovascularly intact.   Skin:    General: Skin is warm and dry.  Neurological:     General: No focal deficit present.     Mental Status: He is alert.  Psychiatric:        Mood and Affect: Mood normal.        Behavior: Behavior normal.     ED Results / Procedures / Treatments   Labs (all labs ordered are listed, but only abnormal results are displayed) Labs Reviewed  CBC WITH DIFFERENTIAL/PLATELET - Abnormal; Notable for the following components:      Result Value   WBC 3.7 (*)    RBC 3.38 (*)    Hemoglobin 9.7 (*)    HCT 30.0 (*)    RDW 16.4 (*)    Lymphs Abs 0.3 (*)    All other components within normal limits  BASIC METABOLIC PANEL - Abnormal; Notable for the following components:   Glucose, Bld 134 (*)    Creatinine, Ser 0.44 (*)    Calcium 8.8 (*)    All other components within normal limits    EKG None  Radiology DG Hip Unilat W or Wo Pelvis 2-3 Views Left  Result Date: 07/23/2022 CLINICAL DATA:  Trauma, fall EXAM: DG HIP (WITH OR WITHOUT PELVIS) 2-3V LEFT COMPARISON:  07/10/2022 FINDINGS: There is previous left hip arthroplasty. No recent fracture or dislocation is seen. There is an expansile lytic lesion measuring 8.1 x 5.4 cm in left superior pubic ramus. Sclerotic changes are noted in left iliac bone close to the SI joint. There are smooth marginated calcifications adjacent to proximal left femur with no significant interval change suggesting possible postsurgical changes. IMPRESSION: No recent fracture or dislocation is seen. Previous left hip arthroplasty. There is an expansile lesion in the left superior pubic ramus suggesting skeletal metastatic  disease. Sclerosis is seen in left iliac bone close to the SI joint suggesting possible skeletal metastatic disease. Electronically Signed   By: Ernie Avena M.D.   On: 07/23/2022 13:08   DG Lumbar Spine Complete  Result Date: 07/23/2022 CLINICAL DATA:  Trauma, fall, pain EXAM: LUMBAR SPINE - COMPLETE 4+ VIEW COMPARISON:  None Available. FINDINGS: No recent fracture is seen in lumbar spine. Degenerative changes are noted with disc space narrowing, bony spurs and facet hypertrophy.  Last lumbar vertebra is transitional. There is previous left hip arthroplasty. Patchy sclerosis is seen in left iliac bone adjacent to the SI joint, possibly skeletal metastatic disease. IMPRESSION: No recent fracture is seen in the lumbar spine. Lumbar spondylosis, most severe at L4-L5 level. There is partial sacralization of L5 vertebra. Sclerotic changes are noted in left iliac bone close to the SI joint suggesting possible metastatic disease. Electronically Signed   By: Ernie Avena M.D.   On: 07/23/2022 13:06   DG Hip Unilat W or Wo Pelvis 2-3 Views Right  Result Date: 07/23/2022 CLINICAL DATA:  Trauma, fall, pain EXAM: DG HIP (WITH OR WITHOUT PELVIS) 2-3V RIGHT COMPARISON:  06/04/2022 FINDINGS: No recent fracture or dislocation is seen in right hip. There is an expansile lytic lesion in left superior pubic ramus suggesting metastatic disease. There is previous left hip arthroplasty. Small marginated calcifications adjacent to the proximal left femur have not changed significantly. Degenerative changes are noted in visualized lower lumbar spine and left SI joint. IMPRESSION: No recent fracture or dislocation is seen. Previous left hip arthroplasty. Expansile lytic lesion in left pubic bone suggests skeletal metastatic disease. There is previous left hip arthroplasty. Lumbar spondylosis. Electronically Signed   By: Ernie Avena M.D.   On: 07/23/2022 13:02    Procedures Procedures    Medications  Ordered in ED Medications  HYDROmorphone (DILAUDID) injection 1 mg (has no administration in time range)  HYDROmorphone (DILAUDID) injection 1 mg (1 mg Intravenous Given 07/23/22 1329)    ED Course/ Medical Decision Making/ A&P                             Medical Decision Making Amount and/or Complexity of Data Reviewed External Data Reviewed: notes.    Details: Oncology note Labs: ordered. Decision-making details documented in ED Course. Radiology: ordered and independent interpretation performed. Decision-making details documented in ED Course.  Risk Prescription drug management.   This patient presents to the ED for concern of fall, this involves an extensive number of treatment options, and is a complaint that carries with it a high risk of complications and morbidity.  The differential diagnosis includes bony fracture, intracranial bleed, dislocation, etc  62 year old male presents to the ED after a mechanical fall that occurred 2 weeks ago.  Patient has a history of metastatic rectal cancer with bone lesions.  He admits to bilateral hip pain.  He admits to hitting his head.  No LOC.  Not on any blood thinners.  Patient sent by oncologist for x-rays to rule out bony fractures.  Upon arrival, stable vitals.  Patient in no acute distress.  Bony tenderness to bilateral hips with decreased range of motion.  No thoracic or lumbar midline tenderness.  Normal neurological exam.  CT head and cervical spine ordered due to head trauma.  X-rays ordered to rule out bony fractures.  Dilaudid given.  Hip x-rays personally reviewed and interpreted which are negative for any recent fractures or dislocations.  Left previous hip arthroplasty.  Lesions to left superior pubic ramus suggestive of metastatic disease and left iliac bone.  Patient able to ambulate in the ED at baseline with walker.  He admits to continued pain in bilateral hips.  Another dose of Dilaudid given.  CT pelvis ordered to rule out  occult fracture.  Patient handed off to [employer], PA-C at shift change pending read on CT head/cervical spine and CT pelvis.   Discussed with Dr.  Schlossman who agrees with assessment and plan.       Final Clinical Impression(s) / ED Diagnoses Final diagnoses:  Fall, initial encounter  Injury of head, initial encounter    Rx / DC Orders ED Discharge Orders     None         Jesusita Oka 07/23/22 1513    Alvira Monday, MD 07/23/22 2122

## 2022-07-23 NOTE — Progress Notes (Signed)
DISCONTINUE ON PATHWAY REGIMEN - Colorectal     A cycle is every 14 days:     Bevacizumab-xxxx      Oxaliplatin      Leucovorin      Fluorouracil      Fluorouracil   **Always confirm dose/schedule in your pharmacy ordering system**  REASON: Disease Progression PRIOR TREATMENT: [id number]: [id number] + Bevacizumab q14 Days TREATMENT RESPONSE: Partial Response (PR)  START ON PATHWAY REGIMEN - Colorectal     A cycle is every 14 days:     Panitumumab      Irinotecan      Leucovorin      Fluorouracil      Fluorouracil   **Always confirm dose/schedule in your pharmacy ordering system**  Patient Characteristics: Distant Metastases, Nonsurgical Candidate, KRAS/NRAS Wild-Type (BRAF V600 Wild-Type/Unknown), Standard Cytotoxic Therapy, Second Line Standard Cytotoxic Therapy, Bevacizumab Ineligible Tumor Location: Rectal Therapeutic Status: Distant Metastases Microsatellite/Mismatch Repair Status: Unknown BRAF Mutation Status: Wild-Type (no mutation) KRAS/NRAS Mutation Status: Wild-Type (no mutation) Preferred Therapy Approach: Standard Cytotoxic Therapy Standard Cytotoxic Line of Therapy: Second Line Standard Cytotoxic Therapy Bevacizumab Eligibility: Ineligible Intent of Therapy: Non-Curative / Palliative Intent, Discussed with Patient

## 2022-07-23 NOTE — ED Notes (Signed)
Unable to find doctor. Gave to [facility] she will hand to MD.

## 2022-07-23 NOTE — Progress Notes (Signed)
[facility] [phone]   [phone]  PROGRESS NOTE  Patient Care Team: [name], MD as PCP - General (Hematology and Oncology) Doreatha Massed, MD as Medical Oncologist (Medical Oncology)  Metastatic rectal carcinoma  ONCOLOGIC HISTORY: # Metastatic Rectal Adenocarcinoma 02/03/2022-02/11/2022: Presented to ED due to left hip pain after a mechanical fall.  02/04/2022: CT left AVW:[id number] pathologic fracture of the left femoral neck with displacement and angulation, with underlying lytic lesions in the femoral head and neck.Additional lytic destructive lesion of the left parasymphyseal pubic bone extending throughout the superior pubic ramus to the puboacetabular junction, with pathologic fracture of the superior pubic ramus. Adjacent hypoattenuation in the pelvic component of the obturator internus muscle, suspicious for tumor involvement. Probable small lytic lesion in the inferior pubic ramus.Prominent left external iliac lymph nodes measuring up to 1.2 cm. 02/05/2022: CT CAP: Pathologic retroperitoneal and bilateral pelvic adenopathy, with lytic destructive lesions of the left superior and inferior pubic ramus and pubic body as well as a pathologic fracture through the left femoral neck. Pathologic fracture anteriorly in the right second rib. Deformity anteriorly in the right third rib probably from early pathologic fracture. Metastatic disease or myeloma strongly favored over multifocal osteomyelitis as a cause. Mildly enlarged left supraclavicular lymph node and lower thoracic periaortic lymph node. Pathologic retroperitoneal and pelvic adenopathy. 10 cm long segment of rectal wall thickening.1.1 cm exophytic lesion from the left mid kidney posterolaterally, nonspecific for complex cyst versus tumor. Bandlike atelectasis in both lungs.Small type 1 hiatal hernia.Lumbar spondylosis and degenerative disc disease causing generally mild multilevel  impingement. Sigmoid colon diverticulosis.Wall thickening in the proximal stomach body, probably secondary to nondistention. 02/06/2022: Underwent left total hip arthroplasty. Pathology revealed poorly differentiated carcinoma.  SPEP/IFE did not detect monoclonal protein. PSA normal. 02/25/2022: Underwent colonoscopy that showed malignant tumor in the rectum. Pathology confirmed poorly differentiated carcinoma.  04/05/2022: Underwent port placement 04/08/2022: Cycle 1, Day 1 of FOLFOX (held bevacizumab due to recent port placement) 04/23/2022: Cycle 2, Day 1 of FOLFOX plus bevacizumab 05/07/2022: Cycle 3, Day 1 of FOLFOX plus bevacizumab 05/21/2022: Cycle 4, Day 1 of FOLFOX plus bevacizumab. Transitioned care to Dr. Caren Hazy at [facility] 07/02/2022: Cycle 6, Day 1 of FOLFOX plus bevacizumab at [facility].  07/11/2022: NM PET CT scan showed progression of hypermetabolic osseous metastatic disease, although there is some mixed change as detailed. New hypermetabolic right femoral neck metastasis places the patient at risk for pathologic right femoral neck fracture. 07/30/2022: anticipated start of Cycle 1 Day 1  FOLFIRI +Panitumumab   HISTORY OF PRESENTING ILLNESS:  Joseph Hogan 62 y.o. male returns for follow-up for metastatic rectal cancer. He presents today to discuss 2nd line treatment. He is unaccompanied for this visit.   On exam today, Joseph Hogan reports he is in extreme pain.  He reports that he is not taking any pain medication today.  He reports that he did have a fall after returning home from his PET CT scan on 07/11/2022.  He notes that his pain medications "are not touching it".  He has been using ice and heat with no relief.  He reports that otherwise he feels quite well.  His appetite is good and he has not been requiring any of his nausea medication.  He is also not been having any diarrhea.  Other than excruciating hip pain he has been asymptomatic.  He denies fevers, chills, night sweats,  shortness of breath, chest pain, cough, peripheral neuropathy or cold  sensitivity.  He has no other complaints.  Rest of the 10 point ROS is below.  Today we also discussed the need to transition his chemotherapy to FOLFIRI plus panitumumab due to progression noted on his prior PET scan.  He voices understanding and details of the regimen are noted below.  Due to the patient's extreme pain provided him with IV Dilaudid 2 mg and transferred him to the emergency department for CT imaging in order to assure that there is no fracture causing his extreme pain.  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   with abnormal R wave progression noted on EKG   MRSA (methicillin resistant Staphylococcus aureus)    Pre-diabetes    Rectal carcinoma (HCC) 03/19/2022    SURGICAL HISTORY: Past Surgical History:  Procedure Laterality Date   COLONOSCOPY     HUMERUS IM NAIL Right 04/12/2022   Procedure: INTRAMEDULLARY (IM) NAIL HUMERAL;  Surgeon: Bjorn Pippin, MD;  Location: [facility];  Service: Orthopedics;  Laterality: Right;   IR IMAGING GUIDED PORT INSERTION  04/05/2022   TOTAL HIP ARTHROPLASTY Left 02/06/2022   Procedure: TOTAL HIP ARTHROPLASTY;  Surgeon: Joen Laura, MD;  Location: [facility];  Service: Orthopedics;  Laterality: Left;    SOCIAL HISTORY: Social History   Socioeconomic History   Marital status: Divorced    Spouse name: Not on file   Number of children: Not on file   Years of education: Not on file   Highest education level: Not on file  Occupational History   Not on file  Tobacco Use   Smoking status: Never   Smokeless tobacco: Never  Vaping Use   Vaping Use: Never used  Substance and Sexual Activity   Alcohol use: No   Drug use: No   Sexual activity: Not on file  Other Topics Concern   Not on file  Social History Narrative   Not on file   Social  Determinants of Health   Financial Resource Strain: Not on file  Food Insecurity: No Food Insecurity (06/10/2022)   Hunger Vital Sign    Worried About Running Out of Food in the Last Year: Never true    Ran Out of Food in the Last Year: Never true  Transportation Needs: No Transportation Needs (06/10/2022)   PRAPARE - Administrator, Civil Service (Medical): No    Lack of Transportation (Non-Medical): No  Physical Activity: Not on file  Stress: Not on file  Social Connections: Not on file  Intimate Partner Violence: Not At Risk (02/04/2022)   Humiliation, Afraid, Rape, and Kick questionnaire    Fear of Current or Ex-Partner: No    Emotionally Abused: No    Physically Abused: No    Sexually Abused: No    FAMILY HISTORY: Family History  Problem Relation Age of Onset   Stroke Mother    Hypertension Mother    Heart disease Father    Alzheimer's disease Father    Hypertension Brother    Alcohol abuse Brother    Cancer Maternal Aunt    Heart disease Maternal Aunt    Cancer Maternal Uncle    Heart disease Maternal Uncle    Cancer Paternal Aunt    Heart disease Paternal Aunt    Cancer Paternal Uncle    Heart disease Paternal Uncle    Heart disease Paternal Grandmother  Alzheimer's disease Paternal Grandfather    Stomach cancer Neg Hx    Rectal cancer Neg Hx    Esophageal cancer Neg Hx    Colon cancer Neg Hx     ALLERGIES:  is allergic to codeine and lisinopril.  MEDICATIONS:  No current facility-administered medications for this visit.   Current Outpatient Medications  Medication Sig Dispense Refill   b complex vitamins capsule Take 1 capsule by mouth daily.     fluconazole (DIFLUCAN) 150 MG tablet Take 1 tablet (150 mg total) by mouth once a week. 4 tablet 0   gabapentin (NEURONTIN) 300 MG capsule Take 1 capsule (300 mg total) by mouth at bedtime. 30 capsule 1   HYDROmorphone (DILAUDID) 2 MG tablet Take 1 tablet (2 mg total) by mouth every 4 (four) hours  as needed for severe pain. 90 tablet 0   lidocaine-prilocaine (EMLA) cream Apply a quarter-sized amount to port a cath site and cover with plastic wrap one hour prior to infusion appointments 30 g 3   Multiple Vitamin (MULTIVITAMIN WITH MINERALS) TABS tablet Take 1 tablet by mouth daily.     ondansetron (ZOFRAN) 8 MG tablet Take 11.5 tablets (92 mg total) by mouth every 8 (eight) hours as needed for nausea or vomiting. (Patient taking differently: Take 8-16 mg by mouth every 8 (eight) hours as needed for nausea or vomiting.) 90 tablet 0   oxyCODONE ER (XTAMPZA ER) 13.5 MG C12A Take 13.5 mg by mouth every 12 (twelve) hours. 60 capsule 0   prochlorperazine (COMPAZINE) 10 MG tablet Take 1 tablet (10 mg total) by mouth every 6 (six) hours as needed for nausea or vomiting. 90 tablet 0   tiZANidine (ZANAFLEX) 4 MG tablet Take 1 tablet (4 mg total) by mouth 3 (three) times daily. 20 tablet 0   VITAMIN A PO Take 1 tablet by mouth daily.     Facility-Administered Medications Ordered in Other Visits  Medication Dose Route Frequency Provider Last Rate Last Admin   HYDROmorphone (DILAUDID) injection 1 mg  1 mg Intravenous Once Claudette Stapler C, PA-C        REVIEW OF SYSTEMS:   Constitutional: ( - ) fevers, ( - )  chills , ( - ) night sweats Eyes: ( - ) blurriness of vision, ( - ) double vision, ( - ) watery eyes Ears, nose, mouth, throat, and face: ( - ) mucositis, ( - ) sore throat Respiratory: ( - ) cough, ( - ) dyspnea, ( - ) wheezes Cardiovascular: ( - ) palpitation, ( - ) chest discomfort, ( - ) lower extremity swelling Gastrointestinal:  ( - ) nausea, ( - ) heartburn, ( - ) change in bowel habits Skin: ( - ) abnormal skin rashes Lymphatics: ( - ) new lymphadenopathy, ( - ) easy bruising Neurological: ( - ) numbness, ( - ) tingling, ( - ) new weaknesses Behavioral/Psych: ( - ) mood change, ( - ) new changes  All other systems were reviewed with the patient and are negative.  PHYSICAL  EXAMINATION: ECOG PERFORMANCE STATUS: 1 - Symptomatic but completely ambulatory  Vitals:   07/23/22 1024  BP: (!) 148/89  Pulse: 90  Resp: 16  Temp: 97.6 F (36.4 C)  SpO2: 100%     Filed Weights   07/23/22 1024  Weight: 198 lb 4.8 oz (89.9 kg)      GENERAL: well appearing male in NAD. Exam performed in wheelchair.  SKIN: skin color, texture, turgor are normal, no rashes or significant  lesions EYES: conjunctiva are pink and non-injected, sclera clear LUNGS: clear to auscultation and percussion with normal breathing effort HEART: regular rate & rhythm and no murmurs and no lower extremity edema Musculoskeletal: no cyanosis of digits and no clubbing  PSYCH: alert & oriented x 3, fluent speech NEURO: no focal motor/sensory deficits  LABORATORY DATA:  I have reviewed the data as listed    Latest Ref Rng & Units 07/23/2022   12:05 PM 07/23/2022    9:55 AM 07/02/2022    7:58 AM  CBC  WBC 4.0 - 10.5 K/uL 3.7  3.9  5.9   Hemoglobin 13.0 - 17.0 g/dL 9.7  16.1  09.6   Hematocrit 39.0 - 52.0 % 30.0  31.7  36.0   Platelets 150 - 400 K/uL 268  289  209        Latest Ref Rng & Units 07/23/2022   12:05 PM 07/23/2022    9:55 AM 07/02/2022    7:58 AM  CMP  Glucose 70 - 99 mg/dL 045  409  811   BUN 8 - 23 mg/dL 17  17  20    Creatinine 0.61 - 1.24 mg/dL 9.14  7.82  9.56   Sodium 135 - 145 mmol/L 135  136  134   Potassium 3.5 - 5.1 mmol/L 3.7  3.9  3.8   Chloride 98 - 111 mmol/L 101  102  103   CO2 22 - 32 mmol/L 23  26  22    Calcium 8.9 - 10.3 mg/dL 8.8  9.2  8.6   Total Protein 6.5 - 8.1 g/dL  7.5  7.3   Total Bilirubin 0.3 - 1.2 mg/dL  0.5  0.8   Alkaline Phos 38 - 126 U/L  121  120   AST 15 - 41 U/L  14  28   ALT 0 - 44 U/L  15  14      PATHOLOGY: Rectum, biopsy POORLY DIFFERENTIATED CARCINOMA. SEE NOTE. Diagnosis Note Addendum: Immunohistochemistry shows the carcinoma is positive with MOC-31, cytokeratin 20 and CDX2. Tumor is negative with cytokeratin 7, cytokeratin  5/6, p40, TTF-1, Napsin A, prostate-specific antigen and prostein. The immunophenotype is consistent with primary colorectal adenocarcinoma.  RADIOGRAPHIC STUDIES: I have personally reviewed the radiological images as listed and agreed with the findings in the report. DG Hip Unilat W or Wo Pelvis 2-3 Views Left  Result Date: 07/23/2022 CLINICAL DATA:  Trauma, fall EXAM: DG HIP (WITH OR WITHOUT PELVIS) 2-3V LEFT COMPARISON:  07/10/2022 FINDINGS: There is previous left hip arthroplasty. No recent fracture or dislocation is seen. There is an expansile lytic lesion measuring 8.1 x 5.4 cm in left superior pubic ramus. Sclerotic changes are noted in left iliac bone close to the SI joint. There are smooth marginated calcifications adjacent to proximal left femur with no significant interval change suggesting possible postsurgical changes. IMPRESSION: No recent fracture or dislocation is seen. Previous left hip arthroplasty. There is an expansile lesion in the left superior pubic ramus suggesting skeletal metastatic disease. Sclerosis is seen in left iliac bone close to the SI joint suggesting possible skeletal metastatic disease. Electronically Signed   By: Ernie Avena M.D.   On: 07/23/2022 13:08   DG Lumbar Spine Complete  Result Date: 07/23/2022 CLINICAL DATA:  Trauma, fall, pain EXAM: LUMBAR SPINE - COMPLETE 4+ VIEW COMPARISON:  None Available. FINDINGS: No recent fracture is seen in lumbar spine. Degenerative changes are noted with disc space narrowing, bony spurs and facet hypertrophy. Last lumbar vertebra is  transitional. There is previous left hip arthroplasty. Patchy sclerosis is seen in left iliac bone adjacent to the SI joint, possibly skeletal metastatic disease. IMPRESSION: No recent fracture is seen in the lumbar spine. Lumbar spondylosis, most severe at L4-L5 level. There is partial sacralization of L5 vertebra. Sclerotic changes are noted in left iliac bone close to the SI joint suggesting  possible metastatic disease. Electronically Signed   By: Ernie Avena M.D.   On: 07/23/2022 13:06   DG Hip Unilat W or Wo Pelvis 2-3 Views Right  Result Date: 07/23/2022 CLINICAL DATA:  Trauma, fall, pain EXAM: DG HIP (WITH OR WITHOUT PELVIS) 2-3V RIGHT COMPARISON:  06/04/2022 FINDINGS: No recent fracture or dislocation is seen in right hip. There is an expansile lytic lesion in left superior pubic ramus suggesting metastatic disease. There is previous left hip arthroplasty. Small marginated calcifications adjacent to the proximal left femur have not changed significantly. Degenerative changes are noted in visualized lower lumbar spine and left SI joint. IMPRESSION: No recent fracture or dislocation is seen. Previous left hip arthroplasty. Expansile lytic lesion in left pubic bone suggests skeletal metastatic disease. There is previous left hip arthroplasty. Lumbar spondylosis. Electronically Signed   By: Ernie Avena M.D.   On: 07/23/2022 13:02   NM PET Image Restag (PS) Skull Base To Thigh  Result Date: 07/16/2022 CLINICAL DATA:  Subsequent treatment strategy for rectal cancer, most recent chemotherapy 2 weeks prior. EXAM: NUCLEAR MEDICINE PET SKULL BASE TO THIGH TECHNIQUE: 11.5 mCi F-18 FDG was injected intravenously. Full-ring PET imaging was performed from the skull base to thigh after the radiotracer. CT data was obtained and used for attenuation correction and anatomic localization. Fasting blood glucose: 138 mg/dl COMPARISON:  16/11/9602 PET-CT. 02/05/2022 CT chest, abdomen and pelvis. FINDINGS: Mediastinal blood pool activity: SUV max 2.1 Liver activity: SUV max NA NECK: Previously visualized mildly hypermetabolic 0.9 cm left supraclavicular lymph node with previous max SUV 3.9 now measures 0.5 cm with max SUV 2.5 (series 3/image 69), decreased in size and metabolism. No newly enlarged or lymph nodes in the neck. Incidental CT findings: Right internal jugular Port-A-Cath terminates at  the cavoatrial junction. Mucoperiosteal thickening in the bilateral inferior maxillary sinuses without air-fluid levels. CHEST: No hypermetabolic pulmonary findings. No enlarged or hypermetabolic axillary or hilar nodes. No new or residual enlarged or hypermetabolic mediastinal nodes. Incidental CT findings: Mildly atherosclerotic nonaneurysmal thoracic aorta. ABDOMEN/PELVIS: The numerous previously visualized enlarged hypermetabolic retroperitoneal, bilateral iliac and bilateral inguinal lymph nodes are nearly all decreased in metabolism and are decreased in size. Representative 1.3 cm left inguinal node with max SUV 4.1 (series 3/image 247), previously 1.4 cm with max SUV 4.2, minimally decreased in size and metabolism. Representative 0.9 cm left external iliac node with max SUV 2.2 (series 3/image 226), previously 1.2 cm with max SUV 4.7, decreased in size and metabolism. A 0.7 cm anterior left para-aortic node demonstrates max SUV 3.7 (series 3/image 191), previously 0.8 cm with max SUV 2.9, slightly decreased in size and minimally increased in metabolism. No newly enlarged or newly hypermetabolic abdominopelvic nodes. Persistent irregular circumferential long segment rectal wall thickening with persistent patchy hypermetabolism with max SUV 5.3, previous max SUV 6.6, mildly decreased in metabolism. Similar presacral space ill-defined fat stranding and fluid. No abnormal hypermetabolic activity within the liver, pancreas, adrenal glands, or spleen. Incidental CT findings: Atherosclerotic nonaneurysmal abdominal aorta. Small simple bilateral renal cysts, largest 2.7 cm in the anterior interpolar right kidney, for which no follow-up imaging is recommended. Moderate  left colonic diverticulosis. SKELETON: Innumerable hypermetabolic mixed lytic and sclerotic osseous lesions throughout the axial and appendicular skeleton, generally increased in metabolism and with several new sites of disease, with representative  lesions as follows: -T8 vertebral lesion with max SUV 10.6, new -anterior right fourth rib expansile lesion with max SUV 7.0, previous max SUV 4.3, increased -right upper sacral large lesion with max SUV 10.9, previous max SUV 6.7, increased -right femoral neck lesion with max SUV 9.6, new -left superior and inferior pubic ramus and left pubic body expansile lesion with max SUV 7.1, previous max SUV 8.7, decreased Incidental CT findings: Left total hip arthroplasty. Partially visualized fixation hardware in right humerus. IMPRESSION: 1. Overall interval progression of hypermetabolic osseous metastatic disease, although there is some mixed change as detailed. New hypermetabolic right femoral neck metastasis places the patient at risk for pathologic right femoral neck fracture. 2. Hypermetabolic metastatic adenopathy in left supraclavicular, mediastinal, retroperitoneal and bilateral pelvic chains is overall significantly improved, although with some mixed change as detailed. 3. Hypermetabolic irregular circumferential long segment rectal wall thickening, mildly decreased in metabolism. 4.  Aortic Atherosclerosis (ICD10-I70.0). Electronically Signed   By: Delbert Phenix M.D.   On: 07/16/2022 12:55   DG Hip Unilat W or Wo Pelvis 2-3 Views Left  Result Date: 07/10/2022 CLINICAL DATA:  Pain, history of bone cancer. EXAM: DG HIP (WITH OR WITHOUT PELVIS) 2-3V LEFT COMPARISON:  Right hip x-ray 06/04/2022. PET-CT 05/02/2022 FINDINGS: Left hip arthroplasty appears in anatomic alignment. There is some heterotopic ossification medially, unchanged. There is no hardware loosening. No acute fracture. Expansile mixed lytic and sclerotic lesion involving the left superior pubic ramus appears unchanged from most recent PET-CT given differences in technique. Left iliac bone lesion near the sacroiliac joint is not well evaluated on this study secondary to overlying bowel gas. Joint spaces appear maintained. IMPRESSION: 1. Left hip  arthroplasty without complicating features. 2. Expansile mixed lytic and sclerotic lesion involving the left superior pubic ramus appears unchanged from most recent PET-CT given differences in technique. Electronically Signed   By: Darliss Cheney M.D.   On: 07/10/2022 13:28    ASSESSMENT & PLAN JAHAD OLD is a 62 y.o. male who presents to the clinic for a follow up for rectal carcinoma.   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  --We reviewed that his cancer is incurable and mainstay treatment is chemotherapy. --Recommend chemotherapy regimen FOLFOX plus Bevacizumab q 2 weeks, started on 04/08/2022.  PLAN: --progression noted on last CT scan. Will d/c FOLFOX + Bev and start FOLIRI + Panitumumab.  --Labs from today reviewed and adequate for treatment. WBC 3.7, hemoglobin 9.7, MCV 88.8, and platelets of 268. --Proceed with treatment today without any dose modification --RTC in 1 week for starting FOLFIRI + Panitumumab and 3 weeks for labs and follow up before Cycle 2, Day 1.   #Microcytic anemia: #Thrombocytosis: --Labs today stable Hgb at  9.7 --Iron panel confirmed iron deficiency.  --patient received IV venofer 200 mg once a week x 3  #Right shoulder pain: --Right shoulder xray form 04/08/2022 showed destructive lesion within the proximal to mid right humerus shaft with acute to subacture pathologic fracture. --Patient underwent pinning of right humerus on 04/12/2022 --Scheduled for radiation oncology consultation on 04/24/2022. --continue Xtampza 13.5 mg q 12 and Dilaudid 2 mg q 4-6 hours for breakthrough pain  #  Right lower extremity neuropathic pain: --Pain well controlled with gabapentin 200 mg TID  #Supportive Care -- chemotherapy education complete -- port placement complete.  --  zofran 8mg  q8H PRN and compazine 10mg  PO q6H for nausea -- EMLA cream for port  Orders Placed This Encounter  Procedures   CBC with Differential (Cancer Center Only)    Standing Status:   Future    Standing Expiration Date:   07/31/2023   CMP (Cancer Center only)    Standing Status:   Future    Standing Expiration Date:   07/31/2023   Magnesium    Standing Status:   Future    Standing Expiration Date:   07/31/2023   CBC with Differential (Cancer Center Only)    Standing Status:   Future    Standing Expiration Date:   08/14/2023   CMP (Cancer Center only)    Standing Status:   Future    Standing Expiration Date:   08/14/2023   Magnesium    Standing Status:   Future    Standing Expiration Date:   08/14/2023    All questions were answered. The patient knows to call the clinic with any problems, questions or concerns.  I have spent a total of 30 minutes minutes of face-to-face and non-face-to-face time, preparing to see the patient, performing a medically appropriate examination, counseling and educating the patient, ordering medications/tests/procedures,  documenting clinical information in the electronic health record,  and care coordination.   Joseph Barns, MD Department of Hematology/Oncology [facility] at [facility] Phone: [phone] Pager: [phone] Email: [email or web address]

## 2022-07-23 NOTE — ED Provider Notes (Signed)
Received handoff from [facility]. S/p fall. Hx of metastatic cancer. Pending head CT, Cervical CT, and pelvic CT. Able to ambulate w/walker at baseline. Physical Exam  BP (!) 141/87   Pulse 70   Temp 97.7 F (36.5 C)   Resp 18   SpO2 98%   Physical Exam Vitals and nursing note reviewed.  Constitutional:      General: He is not in acute distress.    Appearance: He is well-developed.  HENT:     Head: Normocephalic and atraumatic.  Eyes:     Conjunctiva/sclera: Conjunctivae normal.  Cardiovascular:     Rate and Rhythm: Normal rate and regular rhythm.     Heart sounds: No murmur heard. Pulmonary:     Effort: Pulmonary effort is normal. No respiratory distress.     Breath sounds: Normal breath sounds.  Abdominal:     Palpations: Abdomen is soft.     Tenderness: There is no abdominal tenderness.  Musculoskeletal:        General: No swelling.     Cervical back: Neck supple.     Comments: +R hip ttp  Skin:    General: Skin is warm and dry.     Capillary Refill: Capillary refill takes less than 2 seconds.  Neurological:     Mental Status: He is alert.  Psychiatric:        Mood and Affect: Mood normal.     Procedures  Procedures  ED Course / MDM    Medical Decision Making Patient is a 62 year old male, history metastatic cancer, here for fall.  His CTs were reviewed and were unremarkable, except for metastatic findings.  He had no evidence of any kind of fractures, or bleeding.  He states his pain is bad, but would like to go home. Sent oxycodone to the pharmacy for him  for his acute pain and discussed return precautions patient discharged home.  Amount and/or Complexity of Data Reviewed Labs: ordered. Radiology: ordered.  Risk Prescription drug management.         Pete Pelt, [place] 07/23/22 1816    Gwyneth Sprout, MD 07/23/22 [phone]

## 2022-07-23 NOTE — ED Triage Notes (Signed)
Pt arrived  via wc from cancer center. C/o fall a couple weeks ago, onto his buttocks and hitting back of his head onto pavement. C/o soreness and pain [facility] center wanted him evaluated for fractures.

## 2022-07-24 ENCOUNTER — Other Ambulatory Visit: Payer: Self-pay

## 2022-07-24 ENCOUNTER — Telehealth: Payer: Self-pay | Admitting: *Deleted

## 2022-07-24 NOTE — Telephone Encounter (Signed)
Received vm from pt regarding his oxycodone 5 mg. He needs this filled @ [facility]  in [place], [place]  TCT pt to confirm. He did confirm this. Advised that I would let Dr. Leonides Schanz know to send his prescription to [facility].

## 2022-07-25 ENCOUNTER — Other Ambulatory Visit: Payer: Self-pay | Admitting: Hematology and Oncology

## 2022-07-25 MED ORDER — OXYCODONE HCL 5 MG PO TABS: 5.0000 mg | ORAL_TABLET | Freq: Four times a day (QID) | ORAL | 0 refills | Status: DC | PRN

## 2022-07-26 ENCOUNTER — Telehealth: Payer: Self-pay

## 2022-07-26 NOTE — Telephone Encounter (Signed)
Transition Care Management Unsuccessful Follow-up Telephone Call  Date of discharge and from where:  [facility]  6/11 Attempts:  1st Attempt  Reason for unsuccessful TCM follow-up call:  Left voice message   Lenard Forth [facility] Guide, [facility] [phone] [address] [place], [place], [place] [address] Phone: [phone] Email: [email or web address]

## 2022-07-29 ENCOUNTER — Ambulatory Visit
Admission: RE | Admit: 2022-07-29 | Discharge: 2022-07-29 | Disposition: A | Discharge status: Home / Self Care | Payer: Medicare HMO | Source: Ambulatory Visit | Attending: Physician Assistant | Admitting: Physician Assistant

## 2022-07-29 ENCOUNTER — Telehealth: Payer: Self-pay

## 2022-07-29 NOTE — Progress Notes (Signed)
  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 (as documented in provider EOT note)   The patient was available for call today.  The patient did  note fatigue during radiation, but has since improved. The patient did note skin changes in the field of radiation during therapy but this has also improved. The patient has noticed  mild improvement in pain in the area(s) treated with radiation. The patient is not taking dexamethasone. The patient does not have symptoms of  weakness or loss of control of the extremities. The patient does not have symptoms of headache. The patient does not have symptoms of seizure or uncontrolled movement. The patient does not have symptoms of changes in vision. The patient does not have changes in speech. The patient does not have confusion.   The patient is scheduled for ongoing care with Dr. Leonides Schanz  in medical oncology. The patient was encouraged to call if he develops concerns or questions regarding radiation.   This concludes the interview.   Ruel Favors, LPN

## 2022-07-29 NOTE — Telephone Encounter (Signed)
Transition Care Management Unsuccessful Follow-up Telephone Call  Date of discharge and from where:  [facility] 6/11  Attempts:  2nd Attempt  Reason for unsuccessful TCM follow-up call:  Left voice message   Lenard Forth [facility] Guide, [facility] [phone] [address] [place], [place], [place] [address] Phone: [phone] Email: [email or web address]

## 2022-07-30 ENCOUNTER — Inpatient Hospital Stay: Payer: Medicare HMO

## 2022-07-30 ENCOUNTER — Other Ambulatory Visit: Payer: Self-pay

## 2022-07-30 ENCOUNTER — Inpatient Hospital Stay: Payer: Medicare HMO | Admitting: Hematology

## 2022-07-30 MED FILL — Dexamethasone Sodium Phosphate Inj 100 MG/10ML: INTRAMUSCULAR | Qty: 1 | Status: AC

## 2022-07-31 ENCOUNTER — Other Ambulatory Visit: Payer: Self-pay

## 2022-07-31 ENCOUNTER — Other Ambulatory Visit: Payer: Self-pay | Admitting: *Deleted

## 2022-07-31 ENCOUNTER — Inpatient Hospital Stay: Payer: Medicare HMO | Admitting: Hematology and Oncology

## 2022-07-31 ENCOUNTER — Inpatient Hospital Stay: Payer: Medicare HMO

## 2022-07-31 VITALS — BP 137/95 | HR 90 | Temp 98.1°F | Resp 17 | Wt 197.3 lb

## 2022-07-31 DIAGNOSIS — M25511 Pain in right shoulder: Secondary | ICD-10-CM | POA: Diagnosis not present

## 2022-07-31 DIAGNOSIS — D75839 Thrombocytosis, unspecified: Secondary | ICD-10-CM | POA: Insufficient documentation

## 2022-07-31 DIAGNOSIS — C7951 Secondary malignant neoplasm of bone: Secondary | ICD-10-CM | POA: Diagnosis not present

## 2022-07-31 DIAGNOSIS — C20 Malignant neoplasm of rectum: Secondary | ICD-10-CM

## 2022-07-31 DIAGNOSIS — Z79899 Other long term (current) drug therapy: Secondary | ICD-10-CM | POA: Diagnosis not present

## 2022-07-31 DIAGNOSIS — Z5111 Encounter for antineoplastic chemotherapy: Secondary | ICD-10-CM | POA: Diagnosis not present

## 2022-07-31 DIAGNOSIS — Z5112 Encounter for antineoplastic immunotherapy: Secondary | ICD-10-CM | POA: Diagnosis not present

## 2022-07-31 DIAGNOSIS — Z95828 Presence of other vascular implants and grafts: Secondary | ICD-10-CM

## 2022-07-31 DIAGNOSIS — D509 Iron deficiency anemia, unspecified: Secondary | ICD-10-CM | POA: Insufficient documentation

## 2022-07-31 LAB — CMP (CANCER CENTER ONLY)
ALT: 9 U/L (ref 0–44)
AST: 14 U/L — ABNORMAL LOW (ref 15–41)
Albumin: 3.1 g/dL — ABNORMAL LOW (ref 3.5–5.0)
Alkaline Phosphatase: 111 U/L (ref 38–126)
Anion gap: 9 (ref 5–15)
BUN: 10 mg/dL (ref 8–23)
CO2: 28 mmol/L (ref 22–32)
Calcium: 9.1 mg/dL (ref 8.9–10.3)
Chloride: 100 mmol/L (ref 98–111)
Creatinine: 0.53 mg/dL — ABNORMAL LOW (ref 0.61–1.24)
GFR, Estimated: >60 mL/min (ref >60)
Glucose, Bld: 152 mg/dL — ABNORMAL HIGH (ref 70–99)
Potassium: 4 mmol/L (ref 3.5–5.1)
Sodium: 137 mmol/L (ref 135–145)
Total Bilirubin: 0.5 mg/dL (ref 0.3–1.2)
Total Protein: 6.8 g/dL (ref 6.5–8.1)

## 2022-07-31 LAB — CBC WITH DIFFERENTIAL (CANCER CENTER ONLY)
Abs Immature Granulocytes: 0.1 10*3/uL — ABNORMAL HIGH (ref 0.00–0.07)
Basophils Absolute: 0.1 10*3/uL (ref 0.0–0.1)
Basophils Relative: 1 %
Eosinophils Absolute: 0.2 10*3/uL (ref 0.0–0.5)
Eosinophils Relative: 4 %
HCT: 32 % — ABNORMAL LOW (ref 39.0–52.0)
Hemoglobin: 10.5 g/dL — ABNORMAL LOW (ref 13.0–17.0)
Immature Granulocytes: 2 %
Lymphocytes Relative: 9 %
Lymphs Abs: 0.5 10*3/uL — ABNORMAL LOW (ref 0.7–4.0)
MCH: 28.7 pg (ref 26.0–34.0)
MCHC: 32.8 g/dL (ref 30.0–36.0)
MCV: 87.4 fL (ref 80.0–100.0)
Monocytes Absolute: 0.5 10*3/uL (ref 0.1–1.0)
Monocytes Relative: 11 %
Neutro Abs: 3.6 10*3/uL (ref 1.7–7.7)
Neutrophils Relative %: 73 %
Platelet Count: 445 10*3/uL — ABNORMAL HIGH (ref 150–400)
RBC: 3.66 MIL/uL — ABNORMAL LOW (ref 4.22–5.81)
RDW: 15.8 % — ABNORMAL HIGH (ref 11.5–15.5)
WBC Count: 5 10*3/uL (ref 4.0–10.5)
nRBC: 0 % (ref 0.0–0.2)

## 2022-07-31 LAB — MAGNESIUM: Magnesium: 1.8 mg/dL (ref 1.7–2.4)

## 2022-07-31 MED ORDER — TIZANIDINE HCL 4 MG PO TABS: 4.0000 mg | ORAL_TABLET | Freq: Three times a day (TID) | ORAL | 0 refills | Status: DC

## 2022-07-31 MED ORDER — SODIUM CHLORIDE 0.9 % IV SOLN
6.0000 mg/kg | Freq: Once | INTRAVENOUS | Status: AC
  Administered 2022-07-31: 500 mg via INTRAVENOUS
  Filled 2022-07-31: qty 5

## 2022-07-31 MED ORDER — SODIUM CHLORIDE 0.9% FLUSH
10.0000 mL | Freq: Once | INTRAVENOUS | Status: AC
  Administered 2022-07-31: 10 mL

## 2022-07-31 MED ORDER — PALONOSETRON HCL INJECTION 0.25 MG/5ML
0.2500 mg | Freq: Once | INTRAVENOUS | Status: AC
  Administered 2022-07-31: 0.25 mg via INTRAVENOUS
  Filled 2022-07-31: qty 5

## 2022-07-31 MED ORDER — SODIUM CHLORIDE 0.9 % IV SOLN
10.0000 mg | Freq: Once | INTRAVENOUS | Status: AC
  Administered 2022-07-31: 10 mg via INTRAVENOUS
  Filled 2022-07-31: qty 10

## 2022-07-31 MED ORDER — HYDROMORPHONE HCL 1 MG/ML IJ SOLN
2.0000 mg | Freq: Once | INTRAMUSCULAR | Status: AC
  Administered 2022-07-31: 2 mg via INTRAVENOUS
  Filled 2022-07-31: qty 2

## 2022-07-31 MED ORDER — SODIUM CHLORIDE 0.9 % IV SOLN: Freq: Once | INTRAVENOUS | Status: AC

## 2022-07-31 MED ORDER — SODIUM CHLORIDE 0.9 % IV SOLN
400.0000 mg/m2 | Freq: Once | INTRAVENOUS | Status: AC
  Administered 2022-07-31: 848 mg via INTRAVENOUS
  Filled 2022-07-31: qty 42.4

## 2022-07-31 MED ORDER — SODIUM CHLORIDE 0.9 % IV SOLN
180.0000 mg/m2 | Freq: Once | INTRAVENOUS | Status: AC
  Administered 2022-07-31: 400 mg via INTRAVENOUS
  Filled 2022-07-31: qty 5

## 2022-07-31 MED ORDER — ATROPINE SULFATE 1 MG/ML IV SOLN
0.5000 mg | Freq: Once | INTRAVENOUS | Status: DC | PRN
  Filled 2022-07-31: qty 1

## 2022-07-31 MED ORDER — SODIUM CHLORIDE 0.9 % IV SOLN
2400.0000 mg/m2 | INTRAVENOUS | Status: DC
  Administered 2022-07-31: 5000 mg via INTRAVENOUS
  Filled 2022-07-31: qty 100

## 2022-07-31 MED ORDER — DOXYCYCLINE HYCLATE 100 MG PO TABS: 100.0000 mg | ORAL_TABLET | Freq: Two times a day (BID) | ORAL | 1 refills | Status: DC

## 2022-07-31 MED ORDER — SODIUM CHLORIDE 0.9% FLUSH
10.0000 mL | INTRAVENOUS | Status: DC | PRN
  Administered 2022-07-31: 10 mL

## 2022-07-31 MED ORDER — FLUOROURACIL CHEMO INJECTION 2.5 GM/50ML
400.0000 mg/m2 | Freq: Once | INTRAVENOUS | Status: AC
  Administered 2022-07-31: 850 mg via INTRAVENOUS
  Filled 2022-07-31: qty 17

## 2022-07-31 NOTE — Patient Instructions (Signed)
[facility]  Discharge Instructions: Thank you for choosing [facility] to provide your oncology and hematology care.   If you have a lab appointment with the Cancer Center, please go directly to the Cancer Center and check in at the registration area.   Wear comfortable clothing and clothing appropriate for easy access to any Portacath or PICC line.   We strive to give you quality time with your provider. You may need to reschedule your appointment if you arrive late (15 or more minutes).  Arriving late affects you and other patients whose appointments are after yours.  Also, if you miss three or more appointments without notifying the office, you may be dismissed from the clinic at the provider's discretion.      For prescription refill requests, have your pharmacy contact our office and allow 72 hours for refills to be completed.    Today you received the following chemotherapy and/or immunotherapy agents Vectibix, Irinotecan, Fluorouracil      To help prevent nausea and vomiting after your treatment, we encourage you to take your nausea medication as directed.  BELOW ARE SYMPTOMS THAT SHOULD BE REPORTED IMMEDIATELY: *FEVER GREATER THAN 100.4 F (38 C) OR HIGHER *CHILLS OR SWEATING *NAUSEA AND VOMITING THAT IS NOT CONTROLLED WITH YOUR NAUSEA MEDICATION *UNUSUAL SHORTNESS OF BREATH *UNUSUAL BRUISING OR BLEEDING *URINARY PROBLEMS (pain or burning when urinating, or frequent urination) *BOWEL PROBLEMS (unusual diarrhea, constipation, pain near the anus) TENDERNESS IN MOUTH AND THROAT WITH OR WITHOUT PRESENCE OF ULCERS (sore throat, sores in mouth, or a toothache) UNUSUAL RASH, SWELLING OR PAIN  UNUSUAL VAGINAL DISCHARGE OR ITCHING   Items with * indicate a potential emergency and should be followed up as soon as possible or go to the Emergency Department if any problems should occur.  Please show the CHEMOTHERAPY ALERT CARD or  IMMUNOTHERAPY ALERT CARD at check-in to the Emergency Department and triage nurse.  Should you have questions after your visit or need to cancel or reschedule your appointment, please contact [facility]  Dept: [phone]  and follow the prompts.  Office hours are 8:00 a.m. to 4:30 p.m. Monday - Friday. Please note that voicemails left after 4:00 p.m. may not be returned until the following business day.  We are closed weekends and major holidays. You have access to a nurse at all times for urgent questions. Please call the main number to the clinic Dept: [phone] and follow the prompts.   For any non-urgent questions, you may also contact your provider using MyChart. We now offer e-Visits for anyone 53 and older to request care online for non-urgent symptoms. For details visit [email or web address].   Also download the MyChart app! Go to the app store, search "MyChart", open the app, select [place], and log in with your MyChart username and password.  Panitumumab Injection What is this medication? PANITUMUMAB (pan i TOOM ue mab) treats colorectal cancer. It works by blocking a protein that causes cancer cells to grow and multiply. This helps to slow or stop the spread of cancer cells. It is a monoclonal antibody. This medicine may be used for other purposes; ask your health care provider or pharmacist if you have questions. COMMON BRAND NAME(S): Vectibix What should I tell my care team before I take this medication? They need to know if you have any of these conditions: Eye disease Low levels of magnesium in the blood Lung disease An unusual  or allergic reaction to panitumumab, other medications, foods, dyes, or preservatives Pregnant or trying to get pregnant Breast-feeding How should I use this medication? This medication is injected into a vein. It is given by your care team in a hospital or clinic setting. Talk to your care team about the  use of this medication in children. Special care may be needed. Overdosage: If you think you have taken too much of this medicine contact a poison control center or emergency room at once. NOTE: This medicine is only for you. Do not share this medicine with others. What if I miss a dose? Keep appointments for follow-up doses. It is important not to miss your dose. Call your care team if you are unable to keep an appointment. What may interact with this medication? Bevacizumab This list may not describe all possible interactions. Give your health care provider a list of all the medicines, herbs, non-prescription drugs, or dietary supplements you use. Also tell them if you smoke, drink alcohol, or use illegal drugs. Some items may interact with your medicine. What should I watch for while using this medication? Your condition will be monitored carefully while you are receiving this medication. This medication may make you feel generally unwell. This is not uncommon as chemotherapy can affect healthy cells as well as cancer cells. Report any side effects. Continue your course of treatment even though you feel ill unless your care team tells you to stop. This medication can make you more sensitive to the sun. Keep out of the sun while receiving this medication and for 2 months after stopping therapy. If you cannot avoid being in the sun, wear protective clothing and sunscreen. Do not use sun lamps, tanning beds, or tanning booths. Check with your care team if you have severe diarrhea, nausea, and vomiting or if you sweat a lot. The loss of too much body fluid may make it dangerous for you to take this medication. This medication may cause serious skin reactions. They can happen weeks to months after starting the medication. Contact your care team right away if you notice fevers or flu-like symptoms with a rash. The rash may be red or purple and then turn into blisters or peeling of the skin. You may also  notice a red rash with swelling of the face, lips, or lymph nodes in your neck or under your arms. Talk to your care team if you may be pregnant. Serious birth defects can occur if you take this medication during pregnancy and for 2 months after the last dose. Contraception is recommended while taking this medication and for 2 months after the last dose. Your care team can help you find the option that works for you. Do not breastfeed while taking this medication and for 2 months after the last dose. This medication may cause infertility. Talk to your care team if you are concerned about your fertility. What side effects may I notice from receiving this medication? Side effects that you should report to your care team as soon as possible: Allergic reactions--skin rash, itching, hives, swelling of the face, lips, tongue, or throat Dry cough, shortness of breath or trouble breathing Eye pain, redness, irritation, or discharge with blurry or decreased vision Infusion reactions--chest pain, shortness of breath or trouble breathing, feeling faint or lightheaded Low magnesium level--muscle pain or cramps, unusual weakness or fatigue, fast or irregular heartbeat, tremors Low potassium level--muscle pain or cramps, unusual weakness or fatigue, fast or irregular heartbeat, constipation Redness, blistering, peeling,  or loosening of the skin, including inside the mouth Skin reactions on sun-exposed areas Side effects that usually do not require medical attention (report to your care team if they continue or are bothersome): Change in nail shape, thickness, or color Diarrhea Dry skin Fatigue Nausea Vomiting This list may not describe all possible side effects. Call your doctor for medical advice about side effects. You may report side effects to FDA at 1-800-FDA-1088. Where should I keep my medication? This medication is given in a hospital or clinic. It will not be stored at home. NOTE: This sheet is a  summary. It may not cover all possible information. If you have questions about this medicine, talk to your doctor, pharmacist, or health care provider.  2024 Elsevier/Gold Standard (2021-06-13 00:00:00)  Irinotecan Injection What is this medication? IRINOTECAN (ir in oh TEE kan) treats some types of cancer. It works by slowing down the growth of cancer cells. This medicine may be used for other purposes; ask your health care provider or pharmacist if you have questions. COMMON BRAND NAME(S): Camptosar What should I tell my care team before I take this medication? They need to know if you have any of these conditions: Dehydration Diarrhea Infection, especially a viral infection, such as chickenpox, cold sores, herpes Liver disease Low blood cell levels (white cells, red cells, and platelets) Low levels of electrolytes, such as calcium, magnesium, or potassium in your blood Recent or ongoing radiation An unusual or allergic reaction to irinotecan, other medications, foods, dyes, or preservatives If you or your partner are pregnant or trying to get pregnant Breast-feeding How should I use this medication? This medication is injected into a vein. It is given by your care team in a hospital or clinic setting. Talk to your care team about the use of this medication in children. Special care may be needed. Overdosage: If you think you have taken too much of this medicine contact a poison control center or emergency room at once. NOTE: This medicine is only for you. Do not share this medicine with others. What if I miss a dose? Keep appointments for follow-up doses. It is important not to miss your dose. Call your care team if you are unable to keep an appointment. What may interact with this medication? Do not take this medication with any of the following: Cobicistat Itraconazole This medication may also interact with the following: Certain antibiotics, such as clarithromycin, rifampin,  rifabutin Certain antivirals for HIV or AIDS Certain medications for fungal infections, such as ketoconazole, posaconazole, voriconazole Certain medications for seizures, such as carbamazepine, phenobarbital, phenytoin Gemfibrozil Nefazodone St. John's wort This list may not describe all possible interactions. Give your health care provider a list of all the medicines, herbs, non-prescription drugs, or dietary supplements you use. Also tell them if you smoke, drink alcohol, or use illegal drugs. Some items may interact with your medicine. What should I watch for while using this medication? Your condition will be monitored carefully while you are receiving this medication. You may need blood work while taking this medication. This medication may make you feel generally unwell. This is not uncommon as chemotherapy can affect healthy cells as well as cancer cells. Report any side effects. Continue your course of treatment even though you feel ill unless your care team tells you to stop. This medication can cause serious side effects. To reduce the risk, your care team may give you other medications to take before receiving this one. Be sure to follow the  directions from your care team. This medication may affect your coordination, reaction time, or judgement. Do not drive or operate machinery until you know how this medication affects you. Sit up or stand slowly to reduce the risk of dizzy or fainting spells. Drinking alcohol with this medication can increase the risk of these side effects. This medication may increase your risk of getting an infection. Call your care team for advice if you get a fever, chills, sore throat, or other symptoms of a cold or flu. Do not treat yourself. Try to avoid being around people who are sick. Avoid taking medications that contain aspirin, acetaminophen, ibuprofen, naproxen, or ketoprofen unless instructed by your care team. These medications may hide a fever. This  medication may increase your risk to bruise or bleed. Call your care team if you notice any unusual bleeding. Be careful brushing or flossing your teeth or using a toothpick because you may get an infection or bleed more easily. If you have any dental work done, tell your dentist you are receiving this medication. Talk to your care team if you or your partner are pregnant or think either of you might be pregnant. This medication can cause serious birth defects if taken during pregnancy and for 6 months after the last dose. You will need a negative pregnancy test before starting this medication. Contraception is recommended while taking this medication and for 6 months after the last dose. Your care team can help you find the option that works for you. Do not father a child while taking this medication and for 3 months after the last dose. Use a condom for contraception during this time period. Do not breastfeed while taking this medication and for 7 days after the last dose. This medication may cause infertility. Talk to your care team if you are concerned about your fertility. What side effects may I notice from receiving this medication? Side effects that you should report to your care team as soon as possible: Allergic reactions--skin rash, itching, hives, swelling of the face, lips, tongue, or throat Dry cough, shortness of breath or trouble breathing Increased saliva or tears, increased sweating, stomach cramping, diarrhea, small pupils, unusual weakness or fatigue, slow heartbeat Infection--fever, chills, cough, sore throat, wounds that don't heal, pain or trouble when passing urine, general feeling of discomfort or being unwell Kidney injury--decrease in the amount of urine, swelling of the ankles, hands, or feet Low red blood cell level--unusual weakness or fatigue, dizziness, headache, trouble breathing Severe or prolonged diarrhea Unusual bruising or bleeding Side effects that usually do  not require medical attention (report to your care team if they continue or are bothersome): Constipation Diarrhea Hair loss Loss of appetite Nausea Stomach pain This list may not describe all possible side effects. Call your doctor for medical advice about side effects. You may report side effects to FDA at 1-800-FDA-1088. Where should I keep my medication? This medication is given in a hospital or clinic. It will not be stored at home. NOTE: This sheet is a summary. It may not cover all possible information. If you have questions about this medicine, talk to your doctor, pharmacist, or health care provider.  2024 Elsevier/Gold Standard (2021-06-11 00:00:00)  Leucovorin Injection What is this medication? LEUCOVORIN (loo koe VOR in) prevents side effects from certain medications, such as methotrexate. It works by increasing folate levels. This helps protect healthy cells in your body. It may also be used to treat anemia caused by low levels of folate. It  can also be used with fluorouracil, a type of chemotherapy, to treat colorectal cancer. It works by increasing the effects of fluorouracil in the body. This medicine may be used for other purposes; ask your health care provider or pharmacist if you have questions. What should I tell my care team before I take this medication? They need to know if you have any of these conditions: Anemia from low levels of vitamin B12 in the blood An unusual or allergic reaction to leucovorin, folic acid, other medications, foods, dyes, or preservatives Pregnant or trying to get pregnant Breastfeeding How should I use this medication? This medication is injected into a vein or a muscle. It is given by your care team in a hospital or clinic setting. Talk to your care team about the use of this medication in children. Special care may be needed. Overdosage: If you think you have taken too much of this medicine contact a poison control center or emergency room  at once. NOTE: This medicine is only for you. Do not share this medicine with others. What if I miss a dose? Keep appointments for follow-up doses. It is important not to miss your dose. Call your care team if you are unable to keep an appointment. What may interact with this medication? Capecitabine Fluorouracil Phenobarbital Phenytoin Primidone Trimethoprim;sulfamethoxazole This list may not describe all possible interactions. Give your health care provider a list of all the medicines, herbs, non-prescription drugs, or dietary supplements you use. Also tell them if you smoke, drink alcohol, or use illegal drugs. Some items may interact with your medicine. What should I watch for while using this medication? Your condition will be monitored carefully while you are receiving this medication. This medication may increase the side effects of 5-fluorouracil. Tell your care team if you have diarrhea or mouth sores that do not get better or that get worse. What side effects may I notice from receiving this medication? Side effects that you should report to your care team as soon as possible: Allergic reactions--skin rash, itching, hives, swelling of the face, lips, tongue, or throat This list may not describe all possible side effects. Call your doctor for medical advice about side effects. You may report side effects to FDA at 1-800-FDA-1088. Where should I keep my medication? This medication is given in a hospital or clinic. It will not be stored at home. NOTE: This sheet is a summary. It may not cover all possible information. If you have questions about this medicine, talk to your doctor, pharmacist, or health care provider.  2024 Elsevier/Gold Standard (2021-07-03 00:00:00)  Fluorouracil Injection What is this medication? FLUOROURACIL (flure oh YOOR a sil) treats some types of cancer. It works by slowing down the growth of cancer cells. This medicine may be used for other purposes; ask  your health care provider or pharmacist if you have questions. COMMON BRAND NAME(S): Adrucil What should I tell my care team before I take this medication? They need to know if you have any of these conditions: Blood disorders Dihydropyrimidine dehydrogenase (DPD) deficiency Infection, such as chickenpox, cold sores, herpes Kidney disease Liver disease Poor nutrition Recent or ongoing radiation therapy An unusual or allergic reaction to fluorouracil, other medications, foods, dyes, or preservatives If you or your partner are pregnant or trying to get pregnant Breast-feeding How should I use this medication? This medication is injected into a vein. It is administered by your care team in a hospital or clinic setting. Talk to your care team about  the use of this medication in children. Special care may be needed. Overdosage: If you think you have taken too much of this medicine contact a poison control center or emergency room at once. NOTE: This medicine is only for you. Do not share this medicine with others. What if I miss a dose? Keep appointments for follow-up doses. It is important not to miss your dose. Call your care team if you are unable to keep an appointment. What may interact with this medication? Do not take this medication with any of the following: Live virus vaccines This medication may also interact with the following: Medications that treat or prevent blood clots, such as warfarin, enoxaparin, dalteparin This list may not describe all possible interactions. Give your health care provider a list of all the medicines, herbs, non-prescription drugs, or dietary supplements you use. Also tell them if you smoke, drink alcohol, or use illegal drugs. Some items may interact with your medicine. What should I watch for while using this medication? Your condition will be monitored carefully while you are receiving this medication. This medication may make you feel generally unwell.  This is not uncommon as chemotherapy can affect healthy cells as well as cancer cells. Report any side effects. Continue your course of treatment even though you feel ill unless your care team tells you to stop. In some cases, you may be given additional medications to help with side effects. Follow all directions for their use. This medication may increase your risk of getting an infection. Call your care team for advice if you get a fever, chills, sore throat, or other symptoms of a cold or flu. Do not treat yourself. Try to avoid being around people who are sick. This medication may increase your risk to bruise or bleed. Call your care team if you notice any unusual bleeding. Be careful brushing or flossing your teeth or using a toothpick because you may get an infection or bleed more easily. If you have any dental work done, tell your dentist you are receiving this medication. Avoid taking medications that contain aspirin, acetaminophen, ibuprofen, naproxen, or ketoprofen unless instructed by your care team. These medications may hide a fever. Do not treat diarrhea with over the counter products. Contact your care team if you have diarrhea that lasts more than 2 days or if it is severe and watery. This medication can make you more sensitive to the sun. Keep out of the sun. If you cannot avoid being in the sun, wear protective clothing and sunscreen. Do not use sun lamps, tanning beds, or tanning booths. Talk to your care team if you or your partner wish to become pregnant or think you might be pregnant. This medication can cause serious birth defects if taken during pregnancy and for 3 months after the last dose. A reliable form of contraception is recommended while taking this medication and for 3 months after the last dose. Talk to your care team about effective forms of contraception. Do not father a child while taking this medication and for 3 months after the last dose. Use a condom while having sex  during this time period. Do not breastfeed while taking this medication. This medication may cause infertility. Talk to your care team if you are concerned about your fertility. What side effects may I notice from receiving this medication? Side effects that you should report to your care team as soon as possible: Allergic reactions--skin rash, itching, hives, swelling of the face, lips, tongue, or throat  Heart attack--pain or tightness in the chest, shoulders, arms, or jaw, nausea, shortness of breath, cold or clammy skin, feeling faint or lightheaded Heart failure--shortness of breath, swelling of the ankles, feet, or hands, sudden weight gain, unusual weakness or fatigue Heart rhythm changes--fast or irregular heartbeat, dizziness, feeling faint or lightheaded, chest pain, trouble breathing High ammonia level--unusual weakness or fatigue, confusion, loss of appetite, nausea, vomiting, seizures Infection--fever, chills, cough, sore throat, wounds that don't heal, pain or trouble when passing urine, general feeling of discomfort or being unwell Low red blood cell level--unusual weakness or fatigue, dizziness, headache, trouble breathing Pain, tingling, or numbness in the hands or feet, muscle weakness, change in vision, confusion or trouble speaking, loss of balance or coordination, trouble walking, seizures Redness, swelling, and blistering of the skin over hands and feet Severe or prolonged diarrhea Unusual bruising or bleeding Side effects that usually do not require medical attention (report to your care team if they continue or are bothersome): Dry skin Headache Increased tears Nausea Pain, redness, or swelling with sores inside the mouth or throat Sensitivity to light Vomiting This list may not describe all possible side effects. Call your doctor for medical advice about side effects. You may report side effects to FDA at 1-800-FDA-1088. Where should I keep my medication? This  medication is given in a hospital or clinic. It will not be stored at home. NOTE: This sheet is a summary. It may not cover all possible information. If you have questions about this medicine, talk to your doctor, pharmacist, or health care provider.  2024 Elsevier/Gold Standard (2021-06-05 00:00:00)

## 2022-07-31 NOTE — Progress Notes (Signed)
[facility] [phone]   [phone]  PROGRESS NOTE  Patient Care Team: [name], MD as PCP - General (Hematology and Oncology) Doreatha Massed, MD as Medical Oncologist (Medical Oncology)  Metastatic rectal carcinoma  ONCOLOGIC HISTORY: # Metastatic Rectal Adenocarcinoma 02/03/2022-02/11/2022: Presented to ED due to left hip pain after a mechanical fall.  02/04/2022: CT left [id number] pathologic fracture of the left femoral neck with displacement and angulation, with underlying lytic lesions in the femoral head and neck.Additional lytic destructive lesion of the left parasymphyseal pubic bone extending throughout the superior pubic ramus to the puboacetabular junction, with pathologic fracture of the superior pubic ramus. Adjacent hypoattenuation in the pelvic component of the obturator internus muscle, suspicious for tumor involvement. Probable small lytic lesion in the inferior pubic ramus.Prominent left external iliac lymph nodes measuring up to 1.2 cm. 02/05/2022: CT CAP: Pathologic retroperitoneal and bilateral pelvic adenopathy, with lytic destructive lesions of the left superior and inferior pubic ramus and pubic body as well as a pathologic fracture through the left femoral neck. Pathologic fracture anteriorly in the right second rib. Deformity anteriorly in the right third rib probably from early pathologic fracture. Metastatic disease or myeloma strongly favored over multifocal osteomyelitis as a cause. Mildly enlarged left supraclavicular lymph node and lower thoracic periaortic lymph node. Pathologic retroperitoneal and pelvic adenopathy. 10 cm long segment of rectal wall thickening.1.1 cm exophytic lesion from the left mid kidney posterolaterally, nonspecific for complex cyst versus tumor. Bandlike atelectasis in both lungs.Small type 1 hiatal hernia.Lumbar spondylosis and degenerative disc disease causing generally mild multilevel  impingement. Sigmoid colon diverticulosis.Wall thickening in the proximal stomach body, probably secondary to nondistention. 02/06/2022: Underwent left total hip arthroplasty. Pathology revealed poorly differentiated carcinoma.  SPEP/IFE did not detect monoclonal protein. PSA normal. 02/25/2022: Underwent colonoscopy that showed malignant tumor in the rectum. Pathology confirmed poorly differentiated carcinoma.  04/05/2022: Underwent port placement 04/08/2022: Cycle 1, Day 1 of FOLFOX (held bevacizumab due to recent port placement) 04/23/2022: Cycle 2, Day 1 of FOLFOX plus bevacizumab 05/07/2022: Cycle 3, Day 1 of FOLFOX plus bevacizumab 05/21/2022: Cycle 4, Day 1 of FOLFOX plus bevacizumab. Transitioned care to Dr. Caren Hazy at [facility] 07/02/2022: Cycle 6, Day 1 of FOLFOX plus bevacizumab at [facility].  07/11/2022: NM PET CT scan showed progression of hypermetabolic osseous metastatic disease, although there is some mixed change as detailed. New hypermetabolic right femoral neck metastasis places the patient at risk for pathologic right femoral neck fracture. 07/30/2022:  Cycle 1 Day 1  FOLFIRI +Panitumumab   HISTORY OF PRESENTING ILLNESS:  Joseph Hogan 62 y.o. male returns for follow-up for metastatic rectal cancer. He presents today to continue FOLFIRI +Panitumumab.   On exam today, [name] reports he is having horrible pain in his leg.  He reports has been trying massage, heat, and ice.  He is taking his Xtampza as prescribed as well as 2-3 doses of ibuprofen.  He notes he is not taking his oxycodone and Dilaudid as prescribed and he is not taking gabapentin as it makes him "jittery".  He notes that if it were not for the leg pain he would be in good shape.  He is having some constipation though senna does help.  He reports that he has been eating okay and his energy is so-so.  He is not having any lightheadedness, dizziness, or shortness of breath.  Overall he is willing and able to proceed with  chemotherapy at this time.  He denies  any fevers, chills, sweats, nausea, vomiting or diarrhea.  A full 10 point ROS is otherwise negative.  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   with abnormal R wave progression noted on EKG   MRSA (methicillin resistant Staphylococcus aureus)    Pre-diabetes    Rectal carcinoma (HCC) 03/19/2022    SURGICAL HISTORY: Past Surgical History:  Procedure Laterality Date   COLONOSCOPY     HUMERUS IM NAIL Right 04/12/2022   Procedure: INTRAMEDULLARY (IM) NAIL HUMERAL;  Surgeon: Bjorn Pippin, MD;  Location: [facility];  Service: Orthopedics;  Laterality: Right;   IR IMAGING GUIDED PORT INSERTION  04/05/2022   TOTAL HIP ARTHROPLASTY Left 02/06/2022   Procedure: TOTAL HIP ARTHROPLASTY;  Surgeon: Joen Laura, MD;  Location: [facility];  Service: Orthopedics;  Laterality: Left;    SOCIAL HISTORY: Social History   Socioeconomic History   Marital status: Divorced    Spouse name: Not on file   Number of children: Not on file   Years of education: Not on file   Highest education level: Not on file  Occupational History   Not on file  Tobacco Use   Smoking status: Never   Smokeless tobacco: Never  Vaping Use   Vaping Use: Never used  Substance and Sexual Activity   Alcohol use: No   Drug use: No   Sexual activity: Not on file  Other Topics Concern   Not on file  Social History Narrative   Not on file   Social Determinants of Health   Financial Resource Strain: Not on file  Food Insecurity: No Food Insecurity (06/10/2022)   Hunger Vital Sign    Worried About Running Out of Food in the Last Year: Never true    Ran Out of Food in the Last Year: Never true  Transportation Needs: No Transportation Needs (06/10/2022)   PRAPARE - Administrator, Civil Service (Medical): No    Lack of Transportation (Non-Medical): No   Physical Activity: Not on file  Stress: Not on file  Social Connections: Not on file  Intimate Partner Violence: Not At Risk (02/04/2022)   Humiliation, Afraid, Rape, and Kick questionnaire    Fear of Current or Ex-Partner: No    Emotionally Abused: No    Physically Abused: No    Sexually Abused: No    FAMILY HISTORY: Family History  Problem Relation Age of Onset   Stroke Mother    Hypertension Mother    Heart disease Father    Alzheimer's disease Father    Hypertension Brother    Alcohol abuse Brother    Cancer Maternal Aunt    Heart disease Maternal Aunt    Cancer Maternal Uncle    Heart disease Maternal Uncle    Cancer Paternal Aunt    Heart disease Paternal Aunt    Cancer Paternal Uncle    Heart disease Paternal Uncle    Heart disease Paternal Grandmother    Alzheimer's disease Paternal Grandfather    Stomach cancer Neg Hx    Rectal cancer Neg Hx    Esophageal cancer Neg Hx    Colon cancer Neg Hx     ALLERGIES:  is allergic to codeine and lisinopril.  MEDICATIONS:  Current Outpatient Medications  Medication Sig Dispense Refill   doxycycline (VIBRA-TABS) 100 MG tablet Take 1 tablet (100 mg total) by mouth 2 (  two) times daily. 180 tablet 1   lidocaine-prilocaine (EMLA) cream Apply a quarter-sized amount to port a cath site and cover with plastic wrap one hour prior to infusion appointments 30 g 3   b complex vitamins capsule Take 1 capsule by mouth daily.     gabapentin (NEURONTIN) 300 MG capsule Take 1 capsule (300 mg total) by mouth at bedtime. (Patient not taking: Reported on 07/31/2022) 30 capsule 1   HYDROmorphone (DILAUDID) 2 MG tablet Take 1 tablet (2 mg total) by mouth every 4 (four) hours as needed for severe pain. (Patient not taking: Reported on 07/31/2022) 90 tablet 0   Multiple Vitamin (MULTIVITAMIN WITH MINERALS) TABS tablet Take 1 tablet by mouth daily.     ondansetron (ZOFRAN) 8 MG tablet Take 11.5 tablets (92 mg total) by mouth every 8 (eight) hours  as needed for nausea or vomiting. (Patient taking differently: Take 8-16 mg by mouth every 8 (eight) hours as needed for nausea or vomiting.) 90 tablet 0   oxyCODONE (ROXICODONE) 5 MG immediate release tablet Take 1 tablet (5 mg total) by mouth every 6 (six) hours as needed for severe pain. 60 tablet 0   oxyCODONE ER (XTAMPZA ER) 13.5 MG C12A Take 13.5 mg by mouth every 12 (twelve) hours. 60 capsule 0   prochlorperazine (COMPAZINE) 10 MG tablet Take 1 tablet (10 mg total) by mouth every 6 (six) hours as needed for nausea or vomiting. 90 tablet 0   tiZANidine (ZANAFLEX) 4 MG tablet Take 1 tablet (4 mg total) by mouth 3 (three) times daily. 20 tablet 0   VITAMIN A PO Take 1 tablet by mouth daily.     No current facility-administered medications for this visit.   Facility-Administered Medications Ordered in Other Visits  Medication Dose Route Frequency Provider Last Rate Last Admin   atropine injection 0.5 mg  0.5 mg Intravenous Once PRN [name], MD       fluorouracil (ADRUCIL) 5,000 mg in sodium chloride 0.9 % 150 mL chemo infusion  2,400 mg/m2 (Treatment Plan Recorded) Intravenous 1 day or 1 dose [name], MD   Infusion Verify at 07/31/22 1428   sodium chloride flush (NS) 0.9 % injection 10 mL  10 mL Intracatheter PRN [name], MD   10 mL at 07/31/22 1321    REVIEW OF SYSTEMS:   Constitutional: ( - ) fevers, ( - )  chills , ( - ) night sweats Eyes: ( - ) blurriness of vision, ( - ) double vision, ( - ) watery eyes Ears, nose, mouth, throat, and face: ( - ) mucositis, ( - ) sore throat Respiratory: ( - ) cough, ( - ) dyspnea, ( - ) wheezes Cardiovascular: ( - ) palpitation, ( - ) chest discomfort, ( - ) lower extremity swelling Gastrointestinal:  ( - ) nausea, ( - ) heartburn, ( - ) change in bowel habits Skin: ( - ) abnormal skin rashes Lymphatics: ( - ) new lymphadenopathy, ( - ) easy bruising Neurological: ( - ) numbness, ( - ) tingling, ( - ) new  weaknesses Behavioral/Psych: ( - ) mood change, ( - ) new changes  All other systems were reviewed with the patient and are negative.  PHYSICAL EXAMINATION: ECOG PERFORMANCE STATUS: 1 - Symptomatic but completely ambulatory  Vitals:   07/31/22 0817  BP: (!) 137/95  Pulse: 90  Resp: 17  Temp: 98.1 F (36.7 C)  SpO2: 100%      Filed  Weights   07/31/22 0817  Weight: 197 lb 4.8 oz (89.5 kg)       GENERAL: well appearing male in NAD. Exam performed in wheelchair.  SKIN: skin color, texture, turgor are normal, no rashes or significant lesions EYES: conjunctiva are pink and non-injected, sclera clear LUNGS: clear to auscultation and percussion with normal breathing effort HEART: regular rate & rhythm and no murmurs and no lower extremity edema Musculoskeletal: no cyanosis of digits and no clubbing  PSYCH: alert & oriented x 3, fluent speech NEURO: no focal motor/sensory deficits  LABORATORY DATA:  I have reviewed the data as listed    Latest Ref Rng & Units 07/31/2022    7:29 AM 07/23/2022   12:05 PM 07/23/2022    9:55 AM  CBC  WBC 4.0 - 10.5 K/uL 5.0  3.7  3.9   Hemoglobin 13.0 - 17.0 g/dL 86.5  9.7  78.4   Hematocrit 39.0 - 52.0 % 32.0  30.0  31.7   Platelets 150 - 400 K/uL 445  268  289        Latest Ref Rng & Units 07/31/2022    7:29 AM 07/23/2022   12:05 PM 07/23/2022    9:55 AM  CMP  Glucose 70 - 99 mg/dL 696  295  284   BUN 8 - 23 mg/dL 10  17  17    Creatinine 0.61 - 1.24 mg/dL 1.32  4.40  1.02   Sodium 135 - 145 mmol/L 137  135  136   Potassium 3.5 - 5.1 mmol/L 4.0  3.7  3.9   Chloride 98 - 111 mmol/L 100  101  102   CO2 22 - 32 mmol/L 28  23  26    Calcium 8.9 - 10.3 mg/dL 9.1  8.8  9.2   Total Protein 6.5 - 8.1 g/dL 6.8   7.5   Total Bilirubin 0.3 - 1.2 mg/dL 0.5   0.5   Alkaline Phos 38 - 126 U/L 111   121   AST 15 - 41 U/L 14   14   ALT 0 - 44 U/L 9   15      PATHOLOGY: Rectum, biopsy POORLY DIFFERENTIATED CARCINOMA. SEE NOTE. Diagnosis  Note Addendum: Immunohistochemistry shows the carcinoma is positive with MOC-31, cytokeratin 20 and CDX2. Tumor is negative with cytokeratin 7, cytokeratin 5/6, p40, TTF-1, Napsin A, prostate-specific antigen and prostein. The immunophenotype is consistent with primary colorectal adenocarcinoma.  RADIOGRAPHIC STUDIES: I have personally reviewed the radiological images as listed and agreed with the findings in the report. CT PELVIS WO CONTRAST  Result Date: 07/23/2022 CLINICAL DATA:  Hip trauma with fracture suspected.  X-ray done. EXAM: CT PELVIS WITHOUT CONTRAST TECHNIQUE: Multidetector CT imaging of the pelvis was performed following the standard protocol without intravenous contrast. RADIATION DOSE REDUCTION: This exam was performed according to the departmental dose-optimization program which includes automated exposure control, adjustment of the mA and/or kV according to patient size and/or use of iterative reconstruction technique. COMPARISON:  Pelvis and hip radiographs 07/23/2022. PET-CT 07/11/2022 FINDINGS: Urinary Tract: Bladder wall is thickened. This could be due to outlet obstruction, cystitis, or radiation change. Bowel: Visualized portions of small and large bowel are not abnormally distended. Diverticulosis of the sigmoid colon without evidence of acute diverticulitis. Wall thickening of the rectum could be due to under distention, inflammation, or radiation change. Perirectal soft tissue infiltration also may be due to radiation change. Correlate with clinical history. Vascular/Lymphatic: Scattered vascular calcifications. Reproductive:  Prostate gland is not  enlarged. Other: No free air or free fluid in the pelvis. Musculature appears intact. Musculoskeletal: Postoperative changes with left total hip arthroplasty. No periprosthetic fracture or dislocation identified. Degenerative changes in the right hip and lower lumbar spine. Lytic and sclerotic bone lesions throughout the sacrum,  pelvis, and right acetabulum with a large mostly lytic expansile lesion in the left superior pubic ramus corresponding to metabolically active lesions on prior PET-CT consistent with known metastatic disease. No acute or displaced fractures are identified. IMPRESSION: 1. Multiple bone lesions in the pelvis and sacrum corresponding to known metastatic disease. 2. No acute displaced fractures are identified. 3. Left hip arthroplasty. 4. Bladder wall thickening and rectal wall thickening with stranding in the pelvis likely represents postradiation change. Correlate with clinical history. Electronically Signed   By: Burman Nieves M.D.   On: 07/23/2022 17:53   CT Head Wo Contrast  Result Date: 07/23/2022 CLINICAL DATA:  Head trauma, moderate-severe; Neck trauma, dangerous injury mechanism (Age 64-64y) EXAM: CT HEAD WITHOUT CONTRAST CT CERVICAL SPINE WITHOUT CONTRAST TECHNIQUE: Multidetector CT imaging of the head and cervical spine was performed following the standard protocol without intravenous contrast. Multiplanar CT image reconstructions of the cervical spine were also generated. RADIATION DOSE REDUCTION: This exam was performed according to the departmental dose-optimization program which includes automated exposure control, adjustment of the mA and/or kV according to patient size and/or use of iterative reconstruction technique. COMPARISON:  PET CT 07/11/22 FINDINGS: CT HEAD FINDINGS Brain: No evidence of acute infarction, hemorrhage, hydrocephalus, extra-axial collection or mass lesion/mass effect. Sequela of moderate chronic microvascular ischemic change with a focal hypodense region in the right parietal lobe, unchanged from prior exam. Vascular: No hyperdense vessel or unexpected calcification. Skull: Normal. Negative for fracture or focal lesion. Sinuses/Orbits: No middle ear or mastoid effusion. Paranasal sinuses are clear. Orbits are unremarkable. Other: None. CT CERVICAL SPINE FINDINGS Alignment:  Straightening of the normal cervical lordosis. Grade 1 anterolisthesis of C3 on C4. Skull base and vertebrae: No acute fracture. Somewhat irregular appearance of the right lateral mass of C1 (series 5, image 21). This area was FDG avid on prior PET-CT dated 07/11/2022. Soft tissues and spinal canal: No prevertebral fluid or swelling. No visible canal hematoma. Disc levels:  No evidence of high-grade spinal canal stenosis. Upper chest: Negative. Other: None IMPRESSION: 1. No acute intracranial abnormality. 2. No acute fracture or traumatic subluxation of the cervical spine. 3. Sequela of moderate chronic microvascular ischemic change with a focal hypodense region in the right parietal lobe, unchanged from prior exam. 4. Redemonstrated osseous metastatic disease in the lateral mass of C1 on the right. Electronically Signed   By: Lorenza Cambridge M.D.   On: 07/23/2022 15:39   CT Cervical Spine Wo Contrast  Result Date: 07/23/2022 CLINICAL DATA:  Head trauma, moderate-severe; Neck trauma, dangerous injury mechanism (Age 39-64y) EXAM: CT HEAD WITHOUT CONTRAST CT CERVICAL SPINE WITHOUT CONTRAST TECHNIQUE: Multidetector CT imaging of the head and cervical spine was performed following the standard protocol without intravenous contrast. Multiplanar CT image reconstructions of the cervical spine were also generated. RADIATION DOSE REDUCTION: This exam was performed according to the departmental dose-optimization program which includes automated exposure control, adjustment of the mA and/or kV according to patient size and/or use of iterative reconstruction technique. COMPARISON:  PET CT 07/11/22 FINDINGS: CT HEAD FINDINGS Brain: No evidence of acute infarction, hemorrhage, hydrocephalus, extra-axial collection or mass lesion/mass effect. Sequela of moderate chronic microvascular ischemic change with a focal hypodense region in the right parietal  lobe, unchanged from prior exam. Vascular: No hyperdense vessel or unexpected  calcification. Skull: Normal. Negative for fracture or focal lesion. Sinuses/Orbits: No middle ear or mastoid effusion. Paranasal sinuses are clear. Orbits are unremarkable. Other: None. CT CERVICAL SPINE FINDINGS Alignment: Straightening of the normal cervical lordosis. Grade 1 anterolisthesis of C3 on C4. Skull base and vertebrae: No acute fracture. Somewhat irregular appearance of the right lateral mass of C1 (series 5, image 21). This area was FDG avid on prior PET-CT dated 07/11/2022. Soft tissues and spinal canal: No prevertebral fluid or swelling. No visible canal hematoma. Disc levels:  No evidence of high-grade spinal canal stenosis. Upper chest: Negative. Other: None IMPRESSION: 1. No acute intracranial abnormality. 2. No acute fracture or traumatic subluxation of the cervical spine. 3. Sequela of moderate chronic microvascular ischemic change with a focal hypodense region in the right parietal lobe, unchanged from prior exam. 4. Redemonstrated osseous metastatic disease in the lateral mass of C1 on the right. Electronically Signed   By: Lorenza Cambridge M.D.   On: 07/23/2022 15:39   DG Hip Unilat W or Wo Pelvis 2-3 Views Left  Result Date: 07/23/2022 CLINICAL DATA:  Trauma, fall EXAM: DG HIP (WITH OR WITHOUT PELVIS) 2-3V LEFT COMPARISON:  07/10/2022 FINDINGS: There is previous left hip arthroplasty. No recent fracture or dislocation is seen. There is an expansile lytic lesion measuring 8.1 x 5.4 cm in left superior pubic ramus. Sclerotic changes are noted in left iliac bone close to the SI joint. There are smooth marginated calcifications adjacent to proximal left femur with no significant interval change suggesting possible postsurgical changes. IMPRESSION: No recent fracture or dislocation is seen. Previous left hip arthroplasty. There is an expansile lesion in the left superior pubic ramus suggesting skeletal metastatic disease. Sclerosis is seen in left iliac bone close to the SI joint suggesting  possible skeletal metastatic disease. Electronically Signed   By: Ernie Avena M.D.   On: 07/23/2022 13:08   DG Lumbar Spine Complete  Result Date: 07/23/2022 CLINICAL DATA:  Trauma, fall, pain EXAM: LUMBAR SPINE - COMPLETE 4+ VIEW COMPARISON:  None Available. FINDINGS: No recent fracture is seen in lumbar spine. Degenerative changes are noted with disc space narrowing, bony spurs and facet hypertrophy. Last lumbar vertebra is transitional. There is previous left hip arthroplasty. Patchy sclerosis is seen in left iliac bone adjacent to the SI joint, possibly skeletal metastatic disease. IMPRESSION: No recent fracture is seen in the lumbar spine. Lumbar spondylosis, most severe at L4-L5 level. There is partial sacralization of L5 vertebra. Sclerotic changes are noted in left iliac bone close to the SI joint suggesting possible metastatic disease. Electronically Signed   By: Ernie Avena M.D.   On: 07/23/2022 13:06   DG Hip Unilat W or Wo Pelvis 2-3 Views Right  Result Date: 07/23/2022 CLINICAL DATA:  Trauma, fall, pain EXAM: DG HIP (WITH OR WITHOUT PELVIS) 2-3V RIGHT COMPARISON:  06/04/2022 FINDINGS: No recent fracture or dislocation is seen in right hip. There is an expansile lytic lesion in left superior pubic ramus suggesting metastatic disease. There is previous left hip arthroplasty. Small marginated calcifications adjacent to the proximal left femur have not changed significantly. Degenerative changes are noted in visualized lower lumbar spine and left SI joint. IMPRESSION: No recent fracture or dislocation is seen. Previous left hip arthroplasty. Expansile lytic lesion in left pubic bone suggests skeletal metastatic disease. There is previous left hip arthroplasty. Lumbar spondylosis. Electronically Signed   By: Harlan Stains.D.  On: 07/23/2022 13:02   NM PET Image Restag (PS) Skull Base To Thigh  Result Date: 07/16/2022 CLINICAL DATA:  Subsequent treatment strategy for rectal  cancer, most recent chemotherapy 2 weeks prior. EXAM: NUCLEAR MEDICINE PET SKULL BASE TO THIGH TECHNIQUE: 11.5 mCi F-18 FDG was injected intravenously. Full-ring PET imaging was performed from the skull base to thigh after the radiotracer. CT data was obtained and used for attenuation correction and anatomic localization. Fasting blood glucose: 138 mg/dl COMPARISON:  16/11/9602 PET-CT. 02/05/2022 CT chest, abdomen and pelvis. FINDINGS: Mediastinal blood pool activity: SUV max 2.1 Liver activity: SUV max NA NECK: Previously visualized mildly hypermetabolic 0.9 cm left supraclavicular lymph node with previous max SUV 3.9 now measures 0.5 cm with max SUV 2.5 (series 3/image 69), decreased in size and metabolism. No newly enlarged or lymph nodes in the neck. Incidental CT findings: Right internal jugular Port-A-Cath terminates at the cavoatrial junction. Mucoperiosteal thickening in the bilateral inferior maxillary sinuses without air-fluid levels. CHEST: No hypermetabolic pulmonary findings. No enlarged or hypermetabolic axillary or hilar nodes. No new or residual enlarged or hypermetabolic mediastinal nodes. Incidental CT findings: Mildly atherosclerotic nonaneurysmal thoracic aorta. ABDOMEN/PELVIS: The numerous previously visualized enlarged hypermetabolic retroperitoneal, bilateral iliac and bilateral inguinal lymph nodes are nearly all decreased in metabolism and are decreased in size. Representative 1.3 cm left inguinal node with max SUV 4.1 (series 3/image 247), previously 1.4 cm with max SUV 4.2, minimally decreased in size and metabolism. Representative 0.9 cm left external iliac node with max SUV 2.2 (series 3/image 226), previously 1.2 cm with max SUV 4.7, decreased in size and metabolism. A 0.7 cm anterior left para-aortic node demonstrates max SUV 3.7 (series 3/image 191), previously 0.8 cm with max SUV 2.9, slightly decreased in size and minimally increased in metabolism. No newly enlarged or newly  hypermetabolic abdominopelvic nodes. Persistent irregular circumferential long segment rectal wall thickening with persistent patchy hypermetabolism with max SUV 5.3, previous max SUV 6.6, mildly decreased in metabolism. Similar presacral space ill-defined fat stranding and fluid. No abnormal hypermetabolic activity within the liver, pancreas, adrenal glands, or spleen. Incidental CT findings: Atherosclerotic nonaneurysmal abdominal aorta. Small simple bilateral renal cysts, largest 2.7 cm in the anterior interpolar right kidney, for which no follow-up imaging is recommended. Moderate left colonic diverticulosis. SKELETON: Innumerable hypermetabolic mixed lytic and sclerotic osseous lesions throughout the axial and appendicular skeleton, generally increased in metabolism and with several new sites of disease, with representative lesions as follows: -T8 vertebral lesion with max SUV 10.6, new -anterior right fourth rib expansile lesion with max SUV 7.0, previous max SUV 4.3, increased -right upper sacral large lesion with max SUV 10.9, previous max SUV 6.7, increased -right femoral neck lesion with max SUV 9.6, new -left superior and inferior pubic ramus and left pubic body expansile lesion with max SUV 7.1, previous max SUV 8.7, decreased Incidental CT findings: Left total hip arthroplasty. Partially visualized fixation hardware in right humerus. IMPRESSION: 1. Overall interval progression of hypermetabolic osseous metastatic disease, although there is some mixed change as detailed. New hypermetabolic right femoral neck metastasis places the patient at risk for pathologic right femoral neck fracture. 2. Hypermetabolic metastatic adenopathy in left supraclavicular, mediastinal, retroperitoneal and bilateral pelvic chains is overall significantly improved, although with some mixed change as detailed. 3. Hypermetabolic irregular circumferential long segment rectal wall thickening, mildly decreased in metabolism. 4.   Aortic Atherosclerosis (ICD10-I70.0). Electronically Signed   By: Delbert Phenix M.D.   On: 07/16/2022 12:55   DG Hip Unilat  W or Wo Pelvis 2-3 Views Left  Result Date: 07/10/2022 CLINICAL DATA:  Pain, history of bone cancer. EXAM: DG HIP (WITH OR WITHOUT PELVIS) 2-3V LEFT COMPARISON:  Right hip x-ray 06/04/2022. PET-CT 05/02/2022 FINDINGS: Left hip arthroplasty appears in anatomic alignment. There is some heterotopic ossification medially, unchanged. There is no hardware loosening. No acute fracture. Expansile mixed lytic and sclerotic lesion involving the left superior pubic ramus appears unchanged from most recent PET-CT given differences in technique. Left iliac bone lesion near the sacroiliac joint is not well evaluated on this study secondary to overlying bowel gas. Joint spaces appear maintained. IMPRESSION: 1. Left hip arthroplasty without complicating features. 2. Expansile mixed lytic and sclerotic lesion involving the left superior pubic ramus appears unchanged from most recent PET-CT given differences in technique. Electronically Signed   By: Darliss Cheney M.D.   On: 07/10/2022 13:28    ASSESSMENT & PLAN ALESANDRO PETITHOMME is a 62 y.o. male who presents to the clinic for a follow up for rectal carcinoma.   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  --We reviewed that his cancer is incurable and mainstay treatment is chemotherapy. --Recommend chemotherapy regimen FOLFOX plus Bevacizumab q 2 weeks, started on 04/08/2022.  PLAN: --progression noted on last CT scan. D/c FOLFOX + Bev and start FOLIRI + Panitumumab.  --Labs from today reviewed and adequate for treatment. WBC 5.0, Hgb 10.5, MCV 87.4, Plt 445 --Proceed with treatment today without any dose modification --CT scan to be scheduled for late  August/early Sept 2024 --RTC in 2 weeks for starting next cycle of FOLFIRI + Panitumumab   #Microcytic anemia: #Thrombocytosis: --Labs today stable Hgb at 10.5, white blood cell count 5.0, MCV 87.4, and platelets of 445 --Iron panel confirmed iron deficiency.  --patient received IV venofer 200 mg once a week x 3  #Right shoulder pain # Leg Pain  # Bone Pain from Metastatic Cancer  --Right shoulder xray form 04/08/2022 showed destructive lesion within the proximal to mid right humerus shaft with acute to subacture pathologic fracture. --Patient underwent pinning of right humerus on 04/12/2022 --Scheduled for radiation oncology consultation on 04/24/2022. --continue Xtampza 13.5 mg q 12 and Dilaudid 2 mg q 4-6 hours for breakthrough pain -- Will provide a dose of Dilaudid 2 mg IV while in clinic today -- Made referral to our palliative care service  #Supportive Care -- chemotherapy education complete -- port placement complete.  -- zofran 8mg  q8H PRN and compazine 10mg  PO q6H for nausea -- EMLA cream for port  Orders Placed This Encounter  Procedures   CBC with Differential (Cancer Center Only)    Standing Status:   Future    Standing Expiration Date:   08/28/2023   CMP (Cancer Center only)    Standing Status:   Future    Standing Expiration Date:   08/28/2023   Magnesium    Standing Status:   Future    Standing Expiration Date:   08/28/2023   CBC with Differential (Cancer Center Only)    Standing Status:   Future    Standing Expiration Date:   09/11/2023   CMP (Cancer Center only)    Standing Status:   Future    Standing Expiration Date:   09/11/2023   Magnesium    Standing Status:   Future    Standing Expiration Date:   09/11/2023  CBC with Differential (Cancer Center Only)    Standing Status:   Future    Standing Expiration Date:   09/25/2023   CMP (Cancer Center only)    Standing Status:   Future    Standing Expiration Date:   09/25/2023   Magnesium    Standing Status:    Future    Standing Expiration Date:   09/25/2023    All questions were answered. The patient knows to call the clinic with any problems, questions or concerns.  I have spent a total of 30 minutes minutes of face-to-face and non-face-to-face time, preparing to see the patient, performing a medically appropriate examination, counseling and educating the patient, ordering medications/tests/procedures,  documenting clinical information in the electronic health record,  and care coordination.   Ulysees Barns, MD Department of Hematology/Oncology [facility] Phone: [phone] Pager: [phone] Email: [email or web address]

## 2022-08-01 ENCOUNTER — Other Ambulatory Visit: Payer: Self-pay

## 2022-08-01 ENCOUNTER — Inpatient Hospital Stay: Payer: Medicare HMO

## 2022-08-01 ENCOUNTER — Telehealth: Payer: Self-pay

## 2022-08-01 NOTE — Telephone Encounter (Signed)
-----   Message from Arville Care, RN sent at 07/31/2022  2:50 PM EDT ----- Regarding: Leonides Schanz 1st Tx F/U call - Vectibix, Irinotecan, Leucovorin, 5FU push & pump Dorsey 1st Tx F/U call - vectibix, irinotecan, leucovorin, 5FU push & pump.  Tolerated well.

## 2022-08-01 NOTE — Telephone Encounter (Signed)
Joseph Hogan states that he is doing fine. He is eating, drinking, and urinating well. He knows to call the office at [phone] if he has any questions or concerns.

## 2022-08-02 ENCOUNTER — Other Ambulatory Visit: Payer: Self-pay | Admitting: *Deleted

## 2022-08-02 ENCOUNTER — Inpatient Hospital Stay: Payer: Medicare HMO

## 2022-08-02 ENCOUNTER — Other Ambulatory Visit: Payer: Self-pay

## 2022-08-02 VITALS — BP 124/79 | HR 89 | Temp 98.2°F | Resp 20

## 2022-08-02 DIAGNOSIS — C20 Malignant neoplasm of rectum: Secondary | ICD-10-CM | POA: Diagnosis not present

## 2022-08-02 DIAGNOSIS — D75839 Thrombocytosis, unspecified: Secondary | ICD-10-CM | POA: Diagnosis not present

## 2022-08-02 DIAGNOSIS — M25511 Pain in right shoulder: Secondary | ICD-10-CM | POA: Diagnosis not present

## 2022-08-02 DIAGNOSIS — Z5112 Encounter for antineoplastic immunotherapy: Secondary | ICD-10-CM | POA: Diagnosis not present

## 2022-08-02 DIAGNOSIS — Z5111 Encounter for antineoplastic chemotherapy: Secondary | ICD-10-CM | POA: Diagnosis not present

## 2022-08-02 DIAGNOSIS — C7951 Secondary malignant neoplasm of bone: Secondary | ICD-10-CM | POA: Diagnosis not present

## 2022-08-02 DIAGNOSIS — D509 Iron deficiency anemia, unspecified: Secondary | ICD-10-CM | POA: Diagnosis not present

## 2022-08-02 DIAGNOSIS — Z79899 Other long term (current) drug therapy: Secondary | ICD-10-CM | POA: Diagnosis not present

## 2022-08-02 MED ORDER — HEPARIN SOD (PORK) LOCK FLUSH 100 UNIT/ML IV SOLN
500.0000 [IU] | Freq: Once | INTRAVENOUS | Status: AC | PRN
  Administered 2022-08-02: 500 [IU]

## 2022-08-02 MED ORDER — PROCHLORPERAZINE MALEATE 10 MG PO TABS: 10.0000 mg | ORAL_TABLET | Freq: Four times a day (QID) | ORAL | 3 refills | Status: DC | PRN

## 2022-08-02 MED ORDER — ONDANSETRON HCL 8 MG PO TABS: 16.0000 mg | ORAL_TABLET | Freq: Three times a day (TID) | ORAL | 3 refills | Status: DC | PRN

## 2022-08-02 MED ORDER — SODIUM CHLORIDE 0.9% FLUSH
10.0000 mL | INTRAVENOUS | Status: DC | PRN
  Administered 2022-08-02: 10 mL

## 2022-08-02 NOTE — Progress Notes (Signed)
Pt. Here for pump stop and D/C.  States his nausea medication was misplaced. Denies any nausea at this time, but would like new Rx called in to his pharmacy just in case, he needs it. Dr. Leonides Schanz and Beth/RN informed per secure chat.  Beth called in Zofran and Compazine.  Called pt. To inform him.

## 2022-08-06 ENCOUNTER — Other Ambulatory Visit: Payer: Self-pay | Admitting: Hematology and Oncology

## 2022-08-06 ENCOUNTER — Telehealth: Payer: Self-pay | Admitting: Hematology and Oncology

## 2022-08-06 ENCOUNTER — Telehealth: Payer: Self-pay

## 2022-08-06 MED ORDER — HYDROCORTISONE 2.5 % EX CREA: TOPICAL_CREAM | Freq: Two times a day (BID) | CUTANEOUS | 0 refills | Status: DC

## 2022-08-06 NOTE — Telephone Encounter (Signed)
Scheduled appointment per referral message. Left voicemail.

## 2022-08-06 NOTE — Telephone Encounter (Signed)
pt called and has developed a rash on his face with tiny itchy bumps. Wants to know if we can send in a RX for a cream. Said he was told this would happen    hydrocortisone called into his pharmacy [facility])   JD   Pt advised and will call back if rash does not improve.

## 2022-08-13 ENCOUNTER — Other Ambulatory Visit: Payer: Self-pay

## 2022-08-13 MED FILL — Dexamethasone Sodium Phosphate Inj 100 MG/10ML: INTRAMUSCULAR | Qty: 1 | Status: AC

## 2022-08-14 ENCOUNTER — Inpatient Hospital Stay: Payer: Medicare HMO | Admitting: Physician Assistant

## 2022-08-14 ENCOUNTER — Inpatient Hospital Stay: Payer: Medicare HMO | Attending: Radiation Oncology

## 2022-08-14 ENCOUNTER — Inpatient Hospital Stay: Payer: Medicare HMO

## 2022-08-14 ENCOUNTER — Other Ambulatory Visit: Payer: Self-pay

## 2022-08-14 ENCOUNTER — Inpatient Hospital Stay: Payer: Medicare HMO | Admitting: Hematology

## 2022-08-14 VITALS — BP 156/78 | HR 68 | Temp 98.4°F | Resp 18 | Wt 200.4 lb

## 2022-08-14 DIAGNOSIS — D509 Iron deficiency anemia, unspecified: Secondary | ICD-10-CM | POA: Insufficient documentation

## 2022-08-14 DIAGNOSIS — Z79899 Other long term (current) drug therapy: Secondary | ICD-10-CM | POA: Diagnosis not present

## 2022-08-14 DIAGNOSIS — Z5111 Encounter for antineoplastic chemotherapy: Secondary | ICD-10-CM

## 2022-08-14 DIAGNOSIS — D75839 Thrombocytosis, unspecified: Secondary | ICD-10-CM | POA: Insufficient documentation

## 2022-08-14 DIAGNOSIS — C20 Malignant neoplasm of rectum: Secondary | ICD-10-CM

## 2022-08-14 DIAGNOSIS — R21 Rash and other nonspecific skin eruption: Secondary | ICD-10-CM | POA: Insufficient documentation

## 2022-08-14 DIAGNOSIS — Z5112 Encounter for antineoplastic immunotherapy: Secondary | ICD-10-CM | POA: Insufficient documentation

## 2022-08-14 DIAGNOSIS — C7951 Secondary malignant neoplasm of bone: Secondary | ICD-10-CM | POA: Diagnosis not present

## 2022-08-14 DIAGNOSIS — L708 Other acne: Secondary | ICD-10-CM

## 2022-08-14 DIAGNOSIS — G893 Neoplasm related pain (acute) (chronic): Secondary | ICD-10-CM | POA: Insufficient documentation

## 2022-08-14 LAB — CMP (CANCER CENTER ONLY)
ALT: 9 U/L (ref 0–44)
AST: 11 U/L — ABNORMAL LOW (ref 15–41)
Albumin: 3.5 g/dL (ref 3.5–5.0)
Alkaline Phosphatase: 131 U/L — ABNORMAL HIGH (ref 38–126)
Anion gap: 6 (ref 5–15)
BUN: 14 mg/dL (ref 8–23)
CO2: 28 mmol/L (ref 22–32)
Calcium: 9.2 mg/dL (ref 8.9–10.3)
Chloride: 105 mmol/L (ref 98–111)
Creatinine: 0.49 mg/dL — ABNORMAL LOW (ref 0.61–1.24)
GFR, Estimated: >60 mL/min (ref >60)
Glucose, Bld: 104 mg/dL — ABNORMAL HIGH (ref 70–99)
Potassium: 3.9 mmol/L (ref 3.5–5.1)
Sodium: 139 mmol/L (ref 135–145)
Total Bilirubin: 0.5 mg/dL (ref 0.3–1.2)
Total Protein: 6.2 g/dL — ABNORMAL LOW (ref 6.5–8.1)

## 2022-08-14 LAB — CEA (ACCESS): CEA (CHCC): 2.17 ng/mL (ref 0.00–5.00)

## 2022-08-14 LAB — CBC WITH DIFFERENTIAL (CANCER CENTER ONLY)
Abs Immature Granulocytes: 0.02 10*3/uL (ref 0.00–0.07)
Basophils Absolute: 0 10*3/uL (ref 0.0–0.1)
Basophils Relative: 1 %
Eosinophils Absolute: 0.2 10*3/uL (ref 0.0–0.5)
Eosinophils Relative: 8 %
HCT: 33 % — ABNORMAL LOW (ref 39.0–52.0)
Hemoglobin: 10.6 g/dL — ABNORMAL LOW (ref 13.0–17.0)
Immature Granulocytes: 1 %
Lymphocytes Relative: 14 %
Lymphs Abs: 0.4 10*3/uL — ABNORMAL LOW (ref 0.7–4.0)
MCH: 28.6 pg (ref 26.0–34.0)
MCHC: 32.1 g/dL (ref 30.0–36.0)
MCV: 88.9 fL (ref 80.0–100.0)
Monocytes Absolute: 0.3 10*3/uL (ref 0.1–1.0)
Monocytes Relative: 9 %
Neutro Abs: 2.2 10*3/uL (ref 1.7–7.7)
Neutrophils Relative %: 67 %
Platelet Count: 202 10*3/uL (ref 150–400)
RBC: 3.71 MIL/uL — ABNORMAL LOW (ref 4.22–5.81)
RDW: 17.7 % — ABNORMAL HIGH (ref 11.5–15.5)
WBC Count: 3.2 10*3/uL — ABNORMAL LOW (ref 4.0–10.5)
nRBC: 0 % (ref 0.0–0.2)

## 2022-08-14 LAB — MAGNESIUM: Magnesium: 1.8 mg/dL (ref 1.7–2.4)

## 2022-08-14 MED ORDER — SODIUM CHLORIDE 0.9 % IV SOLN
6.0000 mg/kg | Freq: Once | INTRAVENOUS | Status: AC
  Administered 2022-08-14: 500 mg via INTRAVENOUS
  Filled 2022-08-14: qty 5

## 2022-08-14 MED ORDER — SODIUM CHLORIDE 0.9 % IV SOLN
10.0000 mg | Freq: Once | INTRAVENOUS | Status: AC
  Administered 2022-08-14: 10 mg via INTRAVENOUS
  Filled 2022-08-14: qty 10

## 2022-08-14 MED ORDER — SODIUM CHLORIDE 0.9 % IV SOLN
400.0000 mg/m2 | Freq: Once | INTRAVENOUS | Status: AC
  Administered 2022-08-14: 848 mg via INTRAVENOUS
  Filled 2022-08-14: qty 42.4

## 2022-08-14 MED ORDER — FLUOROURACIL CHEMO INJECTION 2.5 GM/50ML
400.0000 mg/m2 | Freq: Once | INTRAVENOUS | Status: AC
  Administered 2022-08-14: 850 mg via INTRAVENOUS
  Filled 2022-08-14: qty 17

## 2022-08-14 MED ORDER — PALONOSETRON HCL INJECTION 0.25 MG/5ML
0.2500 mg | Freq: Once | INTRAVENOUS | Status: AC
  Administered 2022-08-14: 0.25 mg via INTRAVENOUS
  Filled 2022-08-14: qty 5

## 2022-08-14 MED ORDER — SODIUM CHLORIDE 0.9 % IV SOLN
2400.0000 mg/m2 | INTRAVENOUS | Status: DC
  Administered 2022-08-14: 5000 mg via INTRAVENOUS
  Filled 2022-08-14: qty 100

## 2022-08-14 MED ORDER — HEPARIN SOD (PORK) LOCK FLUSH 100 UNIT/ML IV SOLN: 500.0000 [IU] | Freq: Once | INTRAVENOUS | Status: DC | PRN

## 2022-08-14 MED ORDER — SODIUM CHLORIDE 0.9% FLUSH
10.0000 mL | INTRAVENOUS | Status: DC | PRN
  Administered 2022-08-14: 10 mL

## 2022-08-14 MED ORDER — SODIUM CHLORIDE 0.9 % IV SOLN
180.0000 mg/m2 | Freq: Once | INTRAVENOUS | Status: AC
  Administered 2022-08-14: 400 mg via INTRAVENOUS
  Filled 2022-08-14: qty 20

## 2022-08-14 MED ORDER — SODIUM CHLORIDE 0.9 % IV SOLN: Freq: Once | INTRAVENOUS | Status: AC

## 2022-08-14 NOTE — Patient Instructions (Addendum)
[facility]  Discharge Instructions: Thank you for choosing [facility] to provide your oncology and hematology care.   If you have a lab appointment with the Cancer Center, please go directly to the Cancer Center and check in at the registration area.   Wear comfortable clothing and clothing appropriate for easy access to any Portacath or PICC line.   We strive to give you quality time with your provider. You may need to reschedule your appointment if you arrive late (15 or more minutes).  Arriving late affects you and other patients whose appointments are after yours.  Also, if you miss three or more appointments without notifying the office, you may be dismissed from the clinic at the provider's discretion.      For prescription refill requests, have your pharmacy contact our office and allow 72 hours for refills to be completed.    Today you received the following chemotherapy and/or immunotherapy agents Vectibix, Irinotecan, Leucovorin, 5FU      To help prevent nausea and vomiting after your treatment, we encourage you to take your nausea medication as directed.  BELOW ARE SYMPTOMS THAT SHOULD BE REPORTED IMMEDIATELY: *FEVER GREATER THAN 100.4 F (38 C) OR HIGHER *CHILLS OR SWEATING *NAUSEA AND VOMITING THAT IS NOT CONTROLLED WITH YOUR NAUSEA MEDICATION *UNUSUAL SHORTNESS OF BREATH *UNUSUAL BRUISING OR BLEEDING *URINARY PROBLEMS (pain or burning when urinating, or frequent urination) *BOWEL PROBLEMS (unusual diarrhea, constipation, pain near the anus) TENDERNESS IN MOUTH AND THROAT WITH OR WITHOUT PRESENCE OF ULCERS (sore throat, sores in mouth, or a toothache) UNUSUAL RASH, SWELLING OR PAIN  UNUSUAL VAGINAL DISCHARGE OR ITCHING   Items with * indicate a potential emergency and should be followed up as soon as possible or go to the Emergency Department if any problems should occur.  Please show the CHEMOTHERAPY ALERT CARD or  IMMUNOTHERAPY ALERT CARD at check-in to the Emergency Department and triage nurse.  Should you have questions after your visit or need to cancel or reschedule your appointment, please contact [facility]  Dept: [phone]  and follow the prompts.  Office hours are 8:00 a.m. to 4:30 p.m. Monday - Friday. Please note that voicemails left after 4:00 p.m. may not be returned until the following business day.  We are closed weekends and major holidays. You have access to a nurse at all times for urgent questions. Please call the main number to the clinic Dept: [phone] and follow the prompts.   For any non-urgent questions, you may also contact your provider using MyChart. We now offer e-Visits for anyone 18 and older to request care online for non-urgent symptoms. For details visit [email or web address].   Also download the MyChart app! Go to the app store, search "MyChart", open the app, select [facility], and log in with your MyChart username and password.   

## 2022-08-14 NOTE — Progress Notes (Unsigned)
[facility] [phone]   [phone]  PROGRESS NOTE  Patient Care Team: [name], MD as PCP - General (Hematology and Oncology) Doreatha Massed, MD as Medical Oncologist (Medical Oncology)  Metastatic rectal carcinoma  ONCOLOGIC HISTORY: # Metastatic Rectal Adenocarcinoma 02/03/2022-02/11/2022: Presented to ED due to left hip pain after a mechanical fall.  02/04/2022: CT left [id number] pathologic fracture of the left femoral neck with displacement and angulation, with underlying lytic lesions in the femoral head and neck.Additional lytic destructive lesion of the left parasymphyseal pubic bone extending throughout the superior pubic ramus to the puboacetabular junction, with pathologic fracture of the superior pubic ramus. Adjacent hypoattenuation in the pelvic component of the obturator internus muscle, suspicious for tumor involvement. Probable small lytic lesion in the inferior pubic ramus.Prominent left external iliac lymph nodes measuring up to 1.2 cm. 02/05/2022: CT CAP: Pathologic retroperitoneal and bilateral pelvic adenopathy, with lytic destructive lesions of the left superior and inferior pubic ramus and pubic body as well as a pathologic fracture through the left femoral neck. Pathologic fracture anteriorly in the right second rib. Deformity anteriorly in the right third rib probably from early pathologic fracture. Metastatic disease or myeloma strongly favored over multifocal osteomyelitis as a cause. Mildly enlarged left supraclavicular lymph node and lower thoracic periaortic lymph node. Pathologic retroperitoneal and pelvic adenopathy. 10 cm long segment of rectal wall thickening.1.1 cm exophytic lesion from the left mid kidney posterolaterally, nonspecific for complex cyst versus tumor. Bandlike atelectasis in both lungs.Small type 1 hiatal hernia.Lumbar spondylosis and degenerative disc disease causing generally mild multilevel  impingement. Sigmoid colon diverticulosis.Wall thickening in the proximal stomach body, probably secondary to nondistention. 02/06/2022: Underwent left total hip arthroplasty. Pathology revealed poorly differentiated carcinoma.  SPEP/IFE did not detect monoclonal protein. PSA normal. 02/25/2022: Underwent colonoscopy that showed malignant tumor in the rectum. Pathology confirmed poorly differentiated carcinoma.  04/05/2022: Underwent port placement 04/08/2022: Cycle 1, Day 1 of FOLFOX (held bevacizumab due to recent port placement) 04/23/2022: Cycle 2, Day 1 of FOLFOX plus bevacizumab 05/07/2022: Cycle 3, Day 1 of FOLFOX plus bevacizumab 05/21/2022: Cycle 4, Day 1 of FOLFOX plus bevacizumab. Transitioned care to Dr. Caren Hazy at [facility] 07/02/2022: Cycle 6, Day 1 of FOLFOX plus bevacizumab at [facility].  07/11/2022: NM PET CT scan showed progression of hypermetabolic osseous metastatic disease, although there is some mixed change as detailed. New hypermetabolic right femoral neck metastasis places the patient at risk for pathologic right femoral neck fracture. 07/30/2022:  Cycle 1 Day 1  FOLFIRI +Panitumumab   HISTORY OF PRESENTING ILLNESS:  Joseph Hogan 62 y.o. male returns for follow-up for metastatic rectal cancer. He presents today to continue FOLFIRI +Panitumumab.   On exam today, Joseph Hogan reports he is having horrible pain in his leg.  He reports has been trying massage, heat, and ice.  He is taking his Xtampza as prescribed as well as 2-3 doses of ibuprofen.  He notes he is not taking his oxycodone and Dilaudid as prescribed and he is not taking gabapentin as it makes him "jittery".  He notes that if it were not for the leg pain he would be in good shape.  He is having some constipation though senna does help.  He reports that he has been eating okay and his energy is so-so.  He is not having any lightheadedness, dizziness, or shortness of breath.  Overall he is willing and able to proceed with  chemotherapy at this time.  He denies  any fevers, chills, sweats, nausea, vomiting or diarrhea.  A full 10 point ROS is otherwise negative.  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   with abnormal R wave progression noted on EKG   MRSA (methicillin resistant Staphylococcus aureus)    Pre-diabetes    Rectal carcinoma (HCC) 03/19/2022    SURGICAL HISTORY: Past Surgical History:  Procedure Laterality Date   COLONOSCOPY     HUMERUS IM NAIL Right 04/12/2022   Procedure: INTRAMEDULLARY (IM) NAIL HUMERAL;  Surgeon: Bjorn Pippin, MD;  Location: [facility];  Service: Orthopedics;  Laterality: Right;   IR IMAGING GUIDED PORT INSERTION  04/05/2022   TOTAL HIP ARTHROPLASTY Left 02/06/2022   Procedure: TOTAL HIP ARTHROPLASTY;  Surgeon: Joen Laura, MD;  Location: [facility];  Service: Orthopedics;  Laterality: Left;    SOCIAL HISTORY: Social History   Socioeconomic History   Marital status: Divorced    Spouse name: Not on file   Number of children: Not on file   Years of education: Not on file   Highest education level: Not on file  Occupational History   Not on file  Tobacco Use   Smoking status: Never   Smokeless tobacco: Never  Vaping Use   Vaping Use: Never used  Substance and Sexual Activity   Alcohol use: No   Drug use: No   Sexual activity: Not on file  Other Topics Concern   Not on file  Social History Narrative   Not on file   Social Determinants of Health   Financial Resource Strain: Not on file  Food Insecurity: No Food Insecurity (06/10/2022)   Hunger Vital Sign    Worried About Running Out of Food in the Last Year: Never true    Ran Out of Food in the Last Year: Never true  Transportation Needs: No Transportation Needs (06/10/2022)   PRAPARE - Administrator, Civil Service (Medical): No    Lack of Transportation (Non-Medical): No   Physical Activity: Not on file  Stress: Not on file  Social Connections: Not on file  Intimate Partner Violence: Not At Risk (02/04/2022)   Humiliation, Afraid, Rape, and Kick questionnaire    Fear of Current or Ex-Partner: No    Emotionally Abused: No    Physically Abused: No    Sexually Abused: No    FAMILY HISTORY: Family History  Problem Relation Age of Onset   Stroke Mother    Hypertension Mother    Heart disease Father    Alzheimer's disease Father    Hypertension Brother    Alcohol abuse Brother    Cancer Maternal Aunt    Heart disease Maternal Aunt    Cancer Maternal Uncle    Heart disease Maternal Uncle    Cancer Paternal Aunt    Heart disease Paternal Aunt    Cancer Paternal Uncle    Heart disease Paternal Uncle    Heart disease Paternal Grandmother    Alzheimer's disease Paternal Grandfather    Stomach cancer Neg Hx    Rectal cancer Neg Hx    Esophageal cancer Neg Hx    Colon cancer Neg Hx     ALLERGIES:  is allergic to codeine and lisinopril.  MEDICATIONS:  Current Outpatient Medications  Medication Sig Dispense Refill   doxycycline (VIBRA-TABS) 100 MG tablet Take 1 tablet (100 mg total) by mouth 2 (  two) times daily. 180 tablet 1   oxyCODONE ER (XTAMPZA ER) 13.5 MG C12A Take 13.5 mg by mouth every 12 (twelve) hours. 60 capsule 0   b complex vitamins capsule Take 1 capsule by mouth daily. (Patient not taking: Reported on 08/14/2022)     gabapentin (NEURONTIN) 300 MG capsule Take 1 capsule (300 mg total) by mouth at bedtime. (Patient not taking: Reported on 07/31/2022) 30 capsule 1   hydrocortisone 2.5 % cream Apply topically 2 (two) times daily. (Patient not taking: Reported on 08/14/2022) 30 g 0   HYDROmorphone (DILAUDID) 2 MG tablet Take 1 tablet (2 mg total) by mouth every 4 (four) hours as needed for severe pain. (Patient not taking: Reported on 07/31/2022) 90 tablet 0   lidocaine-prilocaine (EMLA) cream Apply a quarter-sized amount to port a cath site and  cover with plastic wrap one hour prior to infusion appointments 30 g 3   Multiple Vitamin (MULTIVITAMIN WITH MINERALS) TABS tablet Take 1 tablet by mouth daily. (Patient not taking: Reported on 08/14/2022)     ondansetron (ZOFRAN) 8 MG tablet Take 2 tablets (16 mg total) by mouth every 8 (eight) hours as needed for nausea or vomiting. (Patient not taking: Reported on 08/14/2022) 20 tablet 3   oxyCODONE (ROXICODONE) 5 MG immediate release tablet Take 1 tablet (5 mg total) by mouth every 6 (six) hours as needed for severe pain. (Patient not taking: Reported on 08/14/2022) 60 tablet 0   prochlorperazine (COMPAZINE) 10 MG tablet Take 1 tablet (10 mg total) by mouth every 6 (six) hours as needed for nausea or vomiting. (Patient not taking: Reported on 08/14/2022) 30 tablet 3   tiZANidine (ZANAFLEX) 4 MG tablet Take 1 tablet (4 mg total) by mouth 3 (three) times daily. (Patient not taking: Reported on 08/14/2022) 20 tablet 0   VITAMIN A PO Take 1 tablet by mouth daily. (Patient not taking: Reported on 08/14/2022)     No current facility-administered medications for this visit.    REVIEW OF SYSTEMS:   Constitutional: ( - ) fevers, ( - )  chills , ( - ) night sweats Eyes: ( - ) blurriness of vision, ( - ) double vision, ( - ) watery eyes Ears, nose, mouth, throat, and face: ( - ) mucositis, ( - ) sore throat Respiratory: ( - ) cough, ( - ) dyspnea, ( - ) wheezes Cardiovascular: ( - ) palpitation, ( - ) chest discomfort, ( - ) lower extremity swelling Gastrointestinal:  ( - ) nausea, ( - ) heartburn, ( - ) change in bowel habits Skin: ( - ) abnormal skin rashes Lymphatics: ( - ) new lymphadenopathy, ( - ) easy bruising Neurological: ( - ) numbness, ( - ) tingling, ( - ) new weaknesses Behavioral/Psych: ( - ) mood change, ( - ) new changes  All other systems were reviewed with the patient and are negative.  PHYSICAL EXAMINATION: ECOG PERFORMANCE STATUS: 1 - Symptomatic but completely ambulatory  Vitals:    08/14/22 1020  BP: (!) 156/78  Pulse: 68  Resp: 18  Temp: 98.4 F (36.9 C)  SpO2: 100%      Filed Weights   08/14/22 1020  Weight: 200 lb 6.4 oz (90.9 kg)       GENERAL: well appearing male in NAD. Exam performed in wheelchair.  SKIN: skin color, texture, turgor are normal, no rashes or significant lesions EYES: conjunctiva are pink and non-injected, sclera clear LUNGS: clear to auscultation and percussion with normal breathing effort HEART:  regular rate & rhythm and no murmurs and no lower extremity edema Musculoskeletal: no cyanosis of digits and no clubbing  PSYCH: alert & oriented x 3, fluent speech NEURO: no focal motor/sensory deficits  LABORATORY DATA:  I have reviewed the data as listed    Latest Ref Rng & Units 08/14/2022    9:46 AM 07/31/2022    7:29 AM 07/23/2022   12:05 PM  CBC  WBC 4.0 - 10.5 K/uL 3.2  5.0  3.7   Hemoglobin 13.0 - 17.0 g/dL 40.9  81.1  9.7   Hematocrit 39.0 - 52.0 % 33.0  32.0  30.0   Platelets 150 - 400 K/uL 202  445  268        Latest Ref Rng & Units 08/14/2022    9:46 AM 07/31/2022    7:29 AM 07/23/2022   12:05 PM  CMP  Glucose 70 - 99 mg/dL 914  782  956   BUN 8 - 23 mg/dL 14  10  17    Creatinine 0.61 - 1.24 mg/dL 2.13  0.86  5.78   Sodium 135 - 145 mmol/L 139  137  135   Potassium 3.5 - 5.1 mmol/L 3.9  4.0  3.7   Chloride 98 - 111 mmol/L 105  100  101   CO2 22 - 32 mmol/L 28  28  23    Calcium 8.9 - 10.3 mg/dL 9.2  9.1  8.8   Total Protein 6.5 - 8.1 g/dL 6.2  6.8    Total Bilirubin 0.3 - 1.2 mg/dL 0.5  0.5    Alkaline Phos 38 - 126 U/L 131  111    AST 15 - 41 U/L 11  14    ALT 0 - 44 U/L 9  9       PATHOLOGY: Rectum, biopsy POORLY DIFFERENTIATED CARCINOMA. SEE NOTE. Diagnosis Note Addendum: Immunohistochemistry shows the carcinoma is positive with MOC-31, cytokeratin 20 and CDX2. Tumor is negative with cytokeratin 7, cytokeratin 5/6, p40, TTF-1, Napsin A, prostate-specific antigen and prostein. The immunophenotype is  consistent with primary colorectal adenocarcinoma.  RADIOGRAPHIC STUDIES: I have personally reviewed the radiological images as listed and agreed with the findings in the report. CT PELVIS WO CONTRAST  Result Date: 07/23/2022 CLINICAL DATA:  Hip trauma with fracture suspected.  X-ray done. EXAM: CT PELVIS WITHOUT CONTRAST TECHNIQUE: Multidetector CT imaging of the pelvis was performed following the standard protocol without intravenous contrast. RADIATION DOSE REDUCTION: This exam was performed according to the departmental dose-optimization program which includes automated exposure control, adjustment of the mA and/or kV according to patient size and/or use of iterative reconstruction technique. COMPARISON:  Pelvis and hip radiographs 07/23/2022. PET-CT 07/11/2022 FINDINGS: Urinary Tract: Bladder wall is thickened. This could be due to outlet obstruction, cystitis, or radiation change. Bowel: Visualized portions of small and large bowel are not abnormally distended. Diverticulosis of the sigmoid colon without evidence of acute diverticulitis. Wall thickening of the rectum could be due to under distention, inflammation, or radiation change. Perirectal soft tissue infiltration also may be due to radiation change. Correlate with clinical history. Vascular/Lymphatic: Scattered vascular calcifications. Reproductive:  Prostate gland is not enlarged. Other: No free air or free fluid in the pelvis. Musculature appears intact. Musculoskeletal: Postoperative changes with left total hip arthroplasty. No periprosthetic fracture or dislocation identified. Degenerative changes in the right hip and lower lumbar spine. Lytic and sclerotic bone lesions throughout the sacrum, pelvis, and right acetabulum with a large mostly lytic expansile lesion in the left superior  pubic ramus corresponding to metabolically active lesions on prior PET-CT consistent with known metastatic disease. No acute or displaced fractures are  identified. IMPRESSION: 1. Multiple bone lesions in the pelvis and sacrum corresponding to known metastatic disease. 2. No acute displaced fractures are identified. 3. Left hip arthroplasty. 4. Bladder wall thickening and rectal wall thickening with stranding in the pelvis likely represents postradiation change. Correlate with clinical history. Electronically Signed   By: Burman Nieves M.D.   On: 07/23/2022 17:53   CT Head Wo Contrast  Result Date: 07/23/2022 CLINICAL DATA:  Head trauma, moderate-severe; Neck trauma, dangerous injury mechanism (Age 89-64y) EXAM: CT HEAD WITHOUT CONTRAST CT CERVICAL SPINE WITHOUT CONTRAST TECHNIQUE: Multidetector CT imaging of the head and cervical spine was performed following the standard protocol without intravenous contrast. Multiplanar CT image reconstructions of the cervical spine were also generated. RADIATION DOSE REDUCTION: This exam was performed according to the departmental dose-optimization program which includes automated exposure control, adjustment of the mA and/or kV according to patient size and/or use of iterative reconstruction technique. COMPARISON:  PET CT 07/11/22 FINDINGS: CT HEAD FINDINGS Brain: No evidence of acute infarction, hemorrhage, hydrocephalus, extra-axial collection or mass lesion/mass effect. Sequela of moderate chronic microvascular ischemic change with a focal hypodense region in the right parietal lobe, unchanged from prior exam. Vascular: No hyperdense vessel or unexpected calcification. Skull: Normal. Negative for fracture or focal lesion. Sinuses/Orbits: No middle ear or mastoid effusion. Paranasal sinuses are clear. Orbits are unremarkable. Other: None. CT CERVICAL SPINE FINDINGS Alignment: Straightening of the normal cervical lordosis. Grade 1 anterolisthesis of C3 on C4. Skull base and vertebrae: No acute fracture. Somewhat irregular appearance of the right lateral mass of C1 (series 5, image 21). This area was FDG avid on prior  PET-CT dated 07/11/2022. Soft tissues and spinal canal: No prevertebral fluid or swelling. No visible canal hematoma. Disc levels:  No evidence of high-grade spinal canal stenosis. Upper chest: Negative. Other: None IMPRESSION: 1. No acute intracranial abnormality. 2. No acute fracture or traumatic subluxation of the cervical spine. 3. Sequela of moderate chronic microvascular ischemic change with a focal hypodense region in the right parietal lobe, unchanged from prior exam. 4. Redemonstrated osseous metastatic disease in the lateral mass of C1 on the right. Electronically Signed   By: Lorenza Cambridge M.D.   On: 07/23/2022 15:39   CT Cervical Spine Wo Contrast  Result Date: 07/23/2022 CLINICAL DATA:  Head trauma, moderate-severe; Neck trauma, dangerous injury mechanism (Age 3-64y) EXAM: CT HEAD WITHOUT CONTRAST CT CERVICAL SPINE WITHOUT CONTRAST TECHNIQUE: Multidetector CT imaging of the head and cervical spine was performed following the standard protocol without intravenous contrast. Multiplanar CT image reconstructions of the cervical spine were also generated. RADIATION DOSE REDUCTION: This exam was performed according to the departmental dose-optimization program which includes automated exposure control, adjustment of the mA and/or kV according to patient size and/or use of iterative reconstruction technique. COMPARISON:  PET CT 07/11/22 FINDINGS: CT HEAD FINDINGS Brain: No evidence of acute infarction, hemorrhage, hydrocephalus, extra-axial collection or mass lesion/mass effect. Sequela of moderate chronic microvascular ischemic change with a focal hypodense region in the right parietal lobe, unchanged from prior exam. Vascular: No hyperdense vessel or unexpected calcification. Skull: Normal. Negative for fracture or focal lesion. Sinuses/Orbits: No middle ear or mastoid effusion. Paranasal sinuses are clear. Orbits are unremarkable. Other: None. CT CERVICAL SPINE FINDINGS Alignment: Straightening of the  normal cervical lordosis. Grade 1 anterolisthesis of C3 on C4. Skull base and vertebrae: No acute  fracture. Somewhat irregular appearance of the right lateral mass of C1 (series 5, image 21). This area was FDG avid on prior PET-CT dated 07/11/2022. Soft tissues and spinal canal: No prevertebral fluid or swelling. No visible canal hematoma. Disc levels:  No evidence of high-grade spinal canal stenosis. Upper chest: Negative. Other: None IMPRESSION: 1. No acute intracranial abnormality. 2. No acute fracture or traumatic subluxation of the cervical spine. 3. Sequela of moderate chronic microvascular ischemic change with a focal hypodense region in the right parietal lobe, unchanged from prior exam. 4. Redemonstrated osseous metastatic disease in the lateral mass of C1 on the right. Electronically Signed   By: Lorenza Cambridge M.D.   On: 07/23/2022 15:39   DG Hip Unilat W or Wo Pelvis 2-3 Views Left  Result Date: 07/23/2022 CLINICAL DATA:  Trauma, fall EXAM: DG HIP (WITH OR WITHOUT PELVIS) 2-3V LEFT COMPARISON:  07/10/2022 FINDINGS: There is previous left hip arthroplasty. No recent fracture or dislocation is seen. There is an expansile lytic lesion measuring 8.1 x 5.4 cm in left superior pubic ramus. Sclerotic changes are noted in left iliac bone close to the SI joint. There are smooth marginated calcifications adjacent to proximal left femur with no significant interval change suggesting possible postsurgical changes. IMPRESSION: No recent fracture or dislocation is seen. Previous left hip arthroplasty. There is an expansile lesion in the left superior pubic ramus suggesting skeletal metastatic disease. Sclerosis is seen in left iliac bone close to the SI joint suggesting possible skeletal metastatic disease. Electronically Signed   By: Ernie Avena M.D.   On: 07/23/2022 13:08   DG Lumbar Spine Complete  Result Date: 07/23/2022 CLINICAL DATA:  Trauma, fall, pain EXAM: LUMBAR SPINE - COMPLETE 4+ VIEW  COMPARISON:  None Available. FINDINGS: No recent fracture is seen in lumbar spine. Degenerative changes are noted with disc space narrowing, bony spurs and facet hypertrophy. Last lumbar vertebra is transitional. There is previous left hip arthroplasty. Patchy sclerosis is seen in left iliac bone adjacent to the SI joint, possibly skeletal metastatic disease. IMPRESSION: No recent fracture is seen in the lumbar spine. Lumbar spondylosis, most severe at L4-L5 level. There is partial sacralization of L5 vertebra. Sclerotic changes are noted in left iliac bone close to the SI joint suggesting possible metastatic disease. Electronically Signed   By: Ernie Avena M.D.   On: 07/23/2022 13:06   DG Hip Unilat W or Wo Pelvis 2-3 Views Right  Result Date: 07/23/2022 CLINICAL DATA:  Trauma, fall, pain EXAM: DG HIP (WITH OR WITHOUT PELVIS) 2-3V RIGHT COMPARISON:  06/04/2022 FINDINGS: No recent fracture or dislocation is seen in right hip. There is an expansile lytic lesion in left superior pubic ramus suggesting metastatic disease. There is previous left hip arthroplasty. Small marginated calcifications adjacent to the proximal left femur have not changed significantly. Degenerative changes are noted in visualized lower lumbar spine and left SI joint. IMPRESSION: No recent fracture or dislocation is seen. Previous left hip arthroplasty. Expansile lytic lesion in left pubic bone suggests skeletal metastatic disease. There is previous left hip arthroplasty. Lumbar spondylosis. Electronically Signed   By: Ernie Avena M.D.   On: 07/23/2022 13:02    ASSESSMENT & PLAN Joseph Hogan is a 62 y.o. male who presents to the clinic for a follow up for rectal carcinoma.   #Metastatic rectal carcinoma involving bone and lymph nodes: --Confirmed with colonoscopy on 02/25/2022 that showed malignant rectal mass --Requested [facility] for molecular testing including KRAS/NRAS, BRAF, MMR/MSI  status.  --We  reviewed that his cancer is incurable and mainstay treatment is chemotherapy. --Recommend chemotherapy regimen FOLFOX plus Bevacizumab q 2 weeks, started on 04/08/2022.  PLAN: --progression noted on last CT scan. D/c FOLFOX + Bev and start FOLIRI + Panitumumab.  --Labs from today reviewed and adequate for treatment. WBC 5.0, Hgb 10.5, MCV 87.4, Plt 445 --Proceed with treatment today without any dose modification --CT scan to be scheduled for late August/early Sept 2024 --RTC in 2 weeks for starting next cycle of FOLFIRI + Panitumumab   #Microcytic anemia: #Thrombocytosis: --Labs today stable Hgb at 10.5, white blood cell count 5.0, MCV 87.4, and platelets of 445 --Iron panel confirmed iron deficiency.  --patient received IV venofer 200 mg once a week x 3  #Right shoulder pain # Leg Pain  # Bone Pain from Metastatic Cancer  --Right shoulder xray form 04/08/2022 showed destructive lesion within the proximal to mid right humerus shaft with acute to subacture pathologic fracture. --Patient underwent pinning of right humerus on 04/12/2022 --Scheduled for radiation oncology consultation on 04/24/2022. --continue Xtampza 13.5 mg q 12 and Dilaudid 2 mg q 4-6 hours for breakthrough pain -- Will provide a dose of Dilaudid 2 mg IV while in clinic today -- Made referral to our palliative care service  #Supportive Care -- chemotherapy education complete -- port placement complete.  -- zofran 8mg  q8H PRN and compazine 10mg  PO q6H for nausea -- EMLA cream for port  No orders of the defined types were placed in this encounter.   All questions were answered. The patient knows to call the clinic with any problems, questions or concerns.  I have spent a total of 30 minutes minutes of face-to-face and non-face-to-face time, preparing to see the patient, performing a medically appropriate examination, counseling and educating the patient, ordering medications/tests/procedures,  documenting clinical  information in the electronic health record,  and care coordination.   Ulysees Barns, MD Department of Hematology/Oncology [facility] Phone: [phone] Pager: [phone] Email: [email or web address]

## 2022-08-15 ENCOUNTER — Encounter (HOSPITAL_COMMUNITY): Payer: Self-pay | Admitting: Hematology and Oncology

## 2022-08-15 ENCOUNTER — Encounter: Payer: Self-pay | Admitting: Hematology and Oncology

## 2022-08-15 MED ORDER — CLINDAMYCIN PHOSPHATE 1 % EX LOTN: TOPICAL_LOTION | CUTANEOUS | 0 refills | Status: DC

## 2022-08-16 ENCOUNTER — Other Ambulatory Visit: Payer: Self-pay | Admitting: Hematology and Oncology

## 2022-08-16 ENCOUNTER — Inpatient Hospital Stay: Payer: Medicare HMO

## 2022-08-16 VITALS — BP 133/73 | HR 55 | Temp 98.4°F | Resp 18

## 2022-08-16 DIAGNOSIS — D75839 Thrombocytosis, unspecified: Secondary | ICD-10-CM | POA: Diagnosis not present

## 2022-08-16 DIAGNOSIS — D509 Iron deficiency anemia, unspecified: Secondary | ICD-10-CM | POA: Diagnosis not present

## 2022-08-16 DIAGNOSIS — Z79899 Other long term (current) drug therapy: Secondary | ICD-10-CM | POA: Diagnosis not present

## 2022-08-16 DIAGNOSIS — G893 Neoplasm related pain (acute) (chronic): Secondary | ICD-10-CM | POA: Diagnosis not present

## 2022-08-16 DIAGNOSIS — C20 Malignant neoplasm of rectum: Secondary | ICD-10-CM | POA: Diagnosis not present

## 2022-08-16 DIAGNOSIS — Z5111 Encounter for antineoplastic chemotherapy: Secondary | ICD-10-CM | POA: Diagnosis not present

## 2022-08-16 DIAGNOSIS — Z5112 Encounter for antineoplastic immunotherapy: Secondary | ICD-10-CM | POA: Diagnosis not present

## 2022-08-16 DIAGNOSIS — R21 Rash and other nonspecific skin eruption: Secondary | ICD-10-CM | POA: Diagnosis not present

## 2022-08-16 DIAGNOSIS — C7951 Secondary malignant neoplasm of bone: Secondary | ICD-10-CM | POA: Diagnosis not present

## 2022-08-16 MED ORDER — HEPARIN SOD (PORK) LOCK FLUSH 100 UNIT/ML IV SOLN
500.0000 [IU] | Freq: Once | INTRAVENOUS | Status: AC | PRN
  Administered 2022-08-16: 500 [IU]

## 2022-08-16 MED ORDER — SODIUM CHLORIDE 0.9% FLUSH
10.0000 mL | INTRAVENOUS | Status: DC | PRN
  Administered 2022-08-16: 10 mL

## 2022-08-19 ENCOUNTER — Telehealth: Payer: Self-pay | Admitting: *Deleted

## 2022-08-19 NOTE — Telephone Encounter (Signed)
Received vm message from pt regarding use of stool softeners for his constipation.   TCT patient and spoke with him. He has tried Miralax 1 x a day for last 3 days with out much success and takes an occasional docusate, again with out much success. He takes Xtampza 1 x a day for pain control with good pain management. Suggested patient can either increase miralax to 2 x a day or add Senna S (with stool softeners and mild laxative). Suggested he start with 2 at night and increase as needed but no more than 4 at night/4 in the morning. Encouraged pt to make sure he is drinking 64 oz water/fluid daily and his fresh fruit and veggies. Pt voiced understanding.

## 2022-08-21 NOTE — Progress Notes (Unsigned)
Palliative Medicine [facility]  [phone] [phone]) [phone]   Name: Joseph Hogan Date: 08/21/2022 MRN: [id number]  DOB: [birth year]  Patient Care Team: [name], MD as PCP - General (Hematology and Oncology) Doreatha Massed, MD as Medical Oncologist (Medical Oncology)    REASON FOR CONSULTATION: Joseph Hogan is a 62 y.o. 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 Previous medical history also includes diabetes, hyperlipidemia, hypertension, and anemia. Palliative ask to see for symptom management and goals of care.    SOCIAL HISTORY:     reports that he has never smoked. He has never used smokeless tobacco. He reports that he does not drink alcohol and does not use drugs.  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   with abnormal R wave progression noted on EKG   MRSA (methicillin resistant Staphylococcus aureus)    Pre-diabetes    Rectal carcinoma (HCC) 03/19/2022    PAST SURGICAL HISTORY:  Past Surgical History:  Procedure Laterality Date   COLONOSCOPY     HUMERUS IM NAIL Right 04/12/2022   Procedure: INTRAMEDULLARY (IM) NAIL HUMERAL;  Surgeon: Bjorn Pippin, MD;  Location: [facility];  Service: Orthopedics;  Laterality: Right;   IR IMAGING GUIDED PORT INSERTION  04/05/2022   TOTAL HIP ARTHROPLASTY Left 02/06/2022   Procedure: TOTAL HIP ARTHROPLASTY;  Surgeon: Joen Laura, MD;  Location: [facility];  Service: Orthopedics;  Laterality: Left;    HEMATOLOGY/ONCOLOGY HISTORY:  Oncology History  Rectal carcinoma (HCC)  03/19/2022 Initial Diagnosis   Rectal carcinoma (HCC)   03/19/2022 Cancer Staging   Staging form: Colon and Rectum, AJCC 8th Edition - Clinical stage from 03/19/2022:  Stage IVB (cTX, cN2b, cM1b) - Signed by [name], MD on 05/21/2022 Stage prefix: Initial diagnosis   04/08/2022 - 07/04/2022 Chemotherapy   Patient is on Treatment Plan : COLORECTAL FOLFOX + Bevacizumab q14d     07/31/2022 -  Chemotherapy   Patient is on Treatment Plan : COLORECTAL FOLFIRI + Panitumumab q14d       ALLERGIES:  is allergic to codeine and lisinopril.  MEDICATIONS:  Current Outpatient Medications  Medication Sig Dispense Refill   b complex vitamins capsule Take 1 capsule by mouth daily. (Patient not taking: Reported on 08/14/2022)     clindamycin (CLEOCIN-T) 1 % lotion Apply as needed for rash secondary to chemotherapy. 60 mL 0   doxycycline (VIBRA-TABS) 100 MG tablet Take 1 tablet (100 mg total) by mouth 2 (two) times daily. 180 tablet 1   gabapentin (NEURONTIN) 300 MG capsule Take 1 capsule (300 mg total) by mouth at bedtime. (Patient not taking: Reported on 07/31/2022) 30 capsule 1   hydrocortisone 2.5 % cream Apply topically 2 (two) times daily. (Patient not taking: Reported on 08/14/2022) 30 g 0   lidocaine-prilocaine (EMLA) cream Apply a quarter-sized amount to port a cath site and cover with plastic wrap one hour prior to infusion appointments 30 g 3   Multiple Vitamin (MULTIVITAMIN WITH MINERALS) TABS tablet Take 1 tablet by mouth daily. (Patient not taking: Reported on 08/14/2022)     ondansetron (ZOFRAN) 8 MG tablet Take 2 tablets (16 mg total) by mouth every 8 (eight) hours as needed for nausea or vomiting. (  Patient not taking: Reported on 08/14/2022) 20 tablet 3   oxyCODONE (ROXICODONE) 5 MG immediate release tablet Take 1 tablet (5 mg total) by mouth every 6 (six) hours as needed for severe pain. (Patient not taking: Reported on 08/14/2022) 60 tablet 0   prochlorperazine (COMPAZINE) 10 MG tablet Take 1 tablet (10 mg total) by mouth every 6 (six) hours as needed for nausea or vomiting. (Patient not taking: Reported on 08/14/2022) 30 tablet 3   tiZANidine (ZANAFLEX) 4 MG  tablet Take 1 tablet (4 mg total) by mouth 3 (three) times daily. (Patient not taking: Reported on 08/14/2022) 20 tablet 0   VITAMIN A PO Take 1 tablet by mouth daily. (Patient not taking: Reported on 08/14/2022)     XTAMPZA ER 13.5 MG C12A TAKE 1 CAPSULE BY MOUTH TWICE DAILY 60 capsule 0   No current facility-administered medications for this visit.    VITAL SIGNS: There were no vitals taken for this visit. There were no vitals filed for this visit.  Estimated body mass index is 27.95 kg/m as calculated from the following:   Height as of 07/10/22: 5\' 11"  (1.803 m).   Weight as of 08/14/22: 200 lb 6.4 oz (90.9 kg).  LABS: CBC:    Component Value Date/Time   WBC 3.2 (L) 08/14/2022 0946   WBC 3.7 (L) 07/23/2022 1205   HGB 10.6 (L) 08/14/2022 0946   HCT 33.0 (L) 08/14/2022 0946   PLT 202 08/14/2022 0946   MCV 88.9 08/14/2022 0946   NEUTROABS 2.2 08/14/2022 0946   LYMPHSABS 0.4 (L) 08/14/2022 0946   MONOABS 0.3 08/14/2022 0946   EOSABS 0.2 08/14/2022 0946   BASOSABS 0.0 08/14/2022 0946   Comprehensive Metabolic Panel:    Component Value Date/Time   NA 139 08/14/2022 0946   K 3.9 08/14/2022 0946   CL 105 08/14/2022 0946   CO2 28 08/14/2022 0946   BUN 14 08/14/2022 0946   CREATININE 0.49 (L) 08/14/2022 0946   CREATININE 0.77 06/04/2016 1259   GLUCOSE 104 (H) 08/14/2022 0946   CALCIUM 9.2 08/14/2022 0946   AST 11 (L) 08/14/2022 0946   ALT 9 08/14/2022 0946   ALKPHOS 131 (H) 08/14/2022 0946   BILITOT 0.5 08/14/2022 0946   PROT 6.2 (L) 08/14/2022 0946   ALBUMIN 3.5 08/14/2022 0946    RADIOGRAPHIC STUDIES: CT PELVIS WO CONTRAST  Result Date: 07/23/2022 CLINICAL DATA:  Hip trauma with fracture suspected.  X-ray done. EXAM: CT PELVIS WITHOUT CONTRAST TECHNIQUE: Multidetector CT imaging of the pelvis was performed following the standard protocol without intravenous contrast. RADIATION DOSE REDUCTION: This exam was performed according to the departmental dose-optimization program  which includes automated exposure control, adjustment of the mA and/or kV according to patient size and/or use of iterative reconstruction technique. COMPARISON:  Pelvis and hip radiographs 07/23/2022. PET-CT 07/11/2022 FINDINGS: Urinary Tract: Bladder wall is thickened. This could be due to outlet obstruction, cystitis, or radiation change. Bowel: Visualized portions of small and large bowel are not abnormally distended. Diverticulosis of the sigmoid colon without evidence of acute diverticulitis. Wall thickening of the rectum could be due to under distention, inflammation, or radiation change. Perirectal soft tissue infiltration also may be due to radiation change. Correlate with clinical history. Vascular/Lymphatic: Scattered vascular calcifications. Reproductive:  Prostate gland is not enlarged. Other: No free air or free fluid in the pelvis. Musculature appears intact. Musculoskeletal: Postoperative changes with left total hip arthroplasty. No periprosthetic fracture or dislocation identified. Degenerative changes in the right hip and lower lumbar  spine. Lytic and sclerotic bone lesions throughout the sacrum, pelvis, and right acetabulum with a large mostly lytic expansile lesion in the left superior pubic ramus corresponding to metabolically active lesions on prior PET-CT consistent with known metastatic disease. No acute or displaced fractures are identified. IMPRESSION: 1. Multiple bone lesions in the pelvis and sacrum corresponding to known metastatic disease. 2. No acute displaced fractures are identified. 3. Left hip arthroplasty. 4. Bladder wall thickening and rectal wall thickening with stranding in the pelvis likely represents postradiation change. Correlate with clinical history. Electronically Signed   By: Burman Nieves M.D.   On: 07/23/2022 17:53   CT Head Wo Contrast  Result Date: 07/23/2022 CLINICAL DATA:  Head trauma, moderate-severe; Neck trauma, dangerous injury mechanism (Age 62-64y)  EXAM: CT HEAD WITHOUT CONTRAST CT CERVICAL SPINE WITHOUT CONTRAST TECHNIQUE: Multidetector CT imaging of the head and cervical spine was performed following the standard protocol without intravenous contrast. Multiplanar CT image reconstructions of the cervical spine were also generated. RADIATION DOSE REDUCTION: This exam was performed according to the departmental dose-optimization program which includes automated exposure control, adjustment of the mA and/or kV according to patient size and/or use of iterative reconstruction technique. COMPARISON:  PET CT 07/11/22 FINDINGS: CT HEAD FINDINGS Brain: No evidence of acute infarction, hemorrhage, hydrocephalus, extra-axial collection or mass lesion/mass effect. Sequela of moderate chronic microvascular ischemic change with a focal hypodense region in the right parietal lobe, unchanged from prior exam. Vascular: No hyperdense vessel or unexpected calcification. Skull: Normal. Negative for fracture or focal lesion. Sinuses/Orbits: No middle ear or mastoid effusion. Paranasal sinuses are clear. Orbits are unremarkable. Other: None. CT CERVICAL SPINE FINDINGS Alignment: Straightening of the normal cervical lordosis. Grade 1 anterolisthesis of C3 on C4. Skull base and vertebrae: No acute fracture. Somewhat irregular appearance of the right lateral mass of C1 (series 5, image 21). This area was FDG avid on prior PET-CT dated 07/11/2022. Soft tissues and spinal canal: No prevertebral fluid or swelling. No visible canal hematoma. Disc levels:  No evidence of high-grade spinal canal stenosis. Upper chest: Negative. Other: None IMPRESSION: 1. No acute intracranial abnormality. 2. No acute fracture or traumatic subluxation of the cervical spine. 3. Sequela of moderate chronic microvascular ischemic change with a focal hypodense region in the right parietal lobe, unchanged from prior exam. 4. Redemonstrated osseous metastatic disease in the lateral mass of C1 on the right.  Electronically Signed   By: Lorenza Cambridge M.D.   On: 07/23/2022 15:39   CT Cervical Spine Wo Contrast  Result Date: 07/23/2022 CLINICAL DATA:  Head trauma, moderate-severe; Neck trauma, dangerous injury mechanism (Age 24-64y) EXAM: CT HEAD WITHOUT CONTRAST CT CERVICAL SPINE WITHOUT CONTRAST TECHNIQUE: Multidetector CT imaging of the head and cervical spine was performed following the standard protocol without intravenous contrast. Multiplanar CT image reconstructions of the cervical spine were also generated. RADIATION DOSE REDUCTION: This exam was performed according to the departmental dose-optimization program which includes automated exposure control, adjustment of the mA and/or kV according to patient size and/or use of iterative reconstruction technique. COMPARISON:  PET CT 07/11/22 FINDINGS: CT HEAD FINDINGS Brain: No evidence of acute infarction, hemorrhage, hydrocephalus, extra-axial collection or mass lesion/mass effect. Sequela of moderate chronic microvascular ischemic change with a focal hypodense region in the right parietal lobe, unchanged from prior exam. Vascular: No hyperdense vessel or unexpected calcification. Skull: Normal. Negative for fracture or focal lesion. Sinuses/Orbits: No middle ear or mastoid effusion. Paranasal sinuses are clear. Orbits are unremarkable. Other: None. CT  CERVICAL SPINE FINDINGS Alignment: Straightening of the normal cervical lordosis. Grade 1 anterolisthesis of C3 on C4. Skull base and vertebrae: No acute fracture. Somewhat irregular appearance of the right lateral mass of C1 (series 5, image 21). This area was FDG avid on prior PET-CT dated 07/11/2022. Soft tissues and spinal canal: No prevertebral fluid or swelling. No visible canal hematoma. Disc levels:  No evidence of high-grade spinal canal stenosis. Upper chest: Negative. Other: None IMPRESSION: 1. No acute intracranial abnormality. 2. No acute fracture or traumatic subluxation of the cervical spine. 3.  Sequela of moderate chronic microvascular ischemic change with a focal hypodense region in the right parietal lobe, unchanged from prior exam. 4. Redemonstrated osseous metastatic disease in the lateral mass of C1 on the right. Electronically Signed   By: Lorenza Cambridge M.D.   On: 07/23/2022 15:39      PERFORMANCE STATUS (ECOG) : {CHL ONC ECOG WJ:[id number]}  Review of Systems Unless otherwise noted, a complete review of systems is negative.  Physical Exam General: NAD Cardiovascular: regular rate and rhythm Pulmonary: clear ant fields Abdomen: soft, nontender, + bowel sounds Extremities: no edema, no joint deformities Skin: no rashes Neurological:  IMPRESSION: *** I introduced myself, Tyechia Allmendinger RN, and Palliative's role in collaboration with the oncology team. Concept of Palliative Care was introduced as specialized medical care for people and their families living with serious illness.  It focuses on providing relief from the symptoms and stress of a serious illness.  The goal is to improve quality of life for both the patient and the family. Values and goals of care important to patient and family were attempted to be elicited.    We discussed *** current illness and what it means in the larger context of *** on-going co-morbidities. Natural disease trajectory and expectations were discussed.  I discussed the importance of continued conversation with family and their medical providers regarding overall plan of care and treatment options, ensuring decisions are within the context of the patients values and GOCs.  PLAN: Established therapeutic relationship. Education provided on palliative's role in collaboration with their Oncology/Radiation team. I will plan to see patient back in 2-4 weeks in collaboration to other oncology appointments.    Patient expressed understanding and was in agreement with this plan. He also understands that He can call the clinic at any time with any  questions, concerns, or complaints.   Thank you for your referral and allowing Palliative to assist in Mr. Lubertha Basque Kriegel's care.   Number and complexity of problems addressed: ***HIGH - 1 or more chronic illnesses with SEVERE exacerbation, progression, or side effects of treatment - advanced cancer, pain. Any controlled substances utilized were prescribed in the context of palliative care.   Visit consisted of counseling and education dealing with the complex and emotionally intense issues of symptom management and palliative care in the setting of serious and potentially life-threatening illness.Greater than 50%  of this time was spent counseling and coordinating care related to the above assessment and plan.  Signed by: Willette Alma, AGPCNP-BC Palliative Medicine Team/[facility]   *Please note that this is a verbal dictation therefore any spelling or grammatical errors are due to the "[employer] One" system interpretation.

## 2022-08-22 ENCOUNTER — Inpatient Hospital Stay: Payer: Medicare HMO | Admitting: Nurse Practitioner

## 2022-08-26 MED FILL — Dexamethasone Sodium Phosphate Inj 100 MG/10ML: INTRAMUSCULAR | Qty: 1 | Status: AC

## 2022-08-27 ENCOUNTER — Inpatient Hospital Stay: Payer: Medicare HMO

## 2022-08-27 ENCOUNTER — Inpatient Hospital Stay: Payer: Medicare HMO | Admitting: Hematology

## 2022-08-27 ENCOUNTER — Other Ambulatory Visit: Payer: Self-pay

## 2022-08-27 ENCOUNTER — Inpatient Hospital Stay (HOSPITAL_BASED_OUTPATIENT_CLINIC_OR_DEPARTMENT_OTHER): Payer: Medicare HMO | Admitting: Hematology and Oncology

## 2022-08-27 DIAGNOSIS — C20 Malignant neoplasm of rectum: Secondary | ICD-10-CM | POA: Diagnosis not present

## 2022-08-27 DIAGNOSIS — C7951 Secondary malignant neoplasm of bone: Secondary | ICD-10-CM | POA: Diagnosis not present

## 2022-08-27 DIAGNOSIS — Z95828 Presence of other vascular implants and grafts: Secondary | ICD-10-CM

## 2022-08-27 DIAGNOSIS — D509 Iron deficiency anemia, unspecified: Secondary | ICD-10-CM | POA: Diagnosis not present

## 2022-08-27 DIAGNOSIS — Z5112 Encounter for antineoplastic immunotherapy: Secondary | ICD-10-CM | POA: Diagnosis not present

## 2022-08-27 DIAGNOSIS — G893 Neoplasm related pain (acute) (chronic): Secondary | ICD-10-CM | POA: Diagnosis not present

## 2022-08-27 DIAGNOSIS — D75839 Thrombocytosis, unspecified: Secondary | ICD-10-CM | POA: Diagnosis not present

## 2022-08-27 DIAGNOSIS — Z5111 Encounter for antineoplastic chemotherapy: Secondary | ICD-10-CM | POA: Diagnosis not present

## 2022-08-27 DIAGNOSIS — R21 Rash and other nonspecific skin eruption: Secondary | ICD-10-CM | POA: Diagnosis not present

## 2022-08-27 DIAGNOSIS — Z79899 Other long term (current) drug therapy: Secondary | ICD-10-CM | POA: Diagnosis not present

## 2022-08-27 LAB — CBC WITH DIFFERENTIAL (CANCER CENTER ONLY)
Abs Immature Granulocytes: 0.01 10*3/uL (ref 0.00–0.07)
Basophils Absolute: 0 10*3/uL (ref 0.0–0.1)
Basophils Relative: 1 %
Eosinophils Absolute: 0.2 10*3/uL (ref 0.0–0.5)
Eosinophils Relative: 6 %
HCT: 31.7 % — ABNORMAL LOW (ref 39.0–52.0)
Hemoglobin: 10.4 g/dL — ABNORMAL LOW (ref 13.0–17.0)
Immature Granulocytes: 0 %
Lymphocytes Relative: 14 %
Lymphs Abs: 0.5 10*3/uL — ABNORMAL LOW (ref 0.7–4.0)
MCH: 29.3 pg (ref 26.0–34.0)
MCHC: 32.8 g/dL (ref 30.0–36.0)
MCV: 89.3 fL (ref 80.0–100.0)
Monocytes Absolute: 0.4 10*3/uL (ref 0.1–1.0)
Monocytes Relative: 11 %
Neutro Abs: 2.5 10*3/uL (ref 1.7–7.7)
Neutrophils Relative %: 68 %
Platelet Count: 184 10*3/uL (ref 150–400)
RBC: 3.55 MIL/uL — ABNORMAL LOW (ref 4.22–5.81)
RDW: 18.3 % — ABNORMAL HIGH (ref 11.5–15.5)
WBC Count: 3.6 10*3/uL — ABNORMAL LOW (ref 4.0–10.5)
nRBC: 0 % (ref 0.0–0.2)

## 2022-08-27 LAB — CMP (CANCER CENTER ONLY)
ALT: 10 U/L (ref 0–44)
AST: 12 U/L — ABNORMAL LOW (ref 15–41)
Albumin: 3.7 g/dL (ref 3.5–5.0)
Alkaline Phosphatase: 117 U/L (ref 38–126)
Anion gap: 6 (ref 5–15)
BUN: 13 mg/dL (ref 8–23)
CO2: 28 mmol/L (ref 22–32)
Calcium: 9.1 mg/dL (ref 8.9–10.3)
Chloride: 105 mmol/L (ref 98–111)
Creatinine: 0.6 mg/dL — ABNORMAL LOW (ref 0.61–1.24)
GFR, Estimated: >60 mL/min (ref >60)
Glucose, Bld: 93 mg/dL (ref 70–99)
Potassium: 4.2 mmol/L (ref 3.5–5.1)
Sodium: 139 mmol/L (ref 135–145)
Total Bilirubin: 0.7 mg/dL (ref 0.3–1.2)
Total Protein: 6.5 g/dL (ref 6.5–8.1)

## 2022-08-27 LAB — MAGNESIUM: Magnesium: 1.9 mg/dL (ref 1.7–2.4)

## 2022-08-27 MED ORDER — LIDOCAINE-PRILOCAINE 2.5-2.5 % EX CREA: TOPICAL_CREAM | CUTANEOUS | 3 refills | Status: DC

## 2022-08-27 MED ORDER — SODIUM CHLORIDE 0.9% FLUSH
10.0000 mL | Freq: Once | INTRAVENOUS | Status: AC
  Administered 2022-08-27: 10 mL

## 2022-08-27 MED ORDER — FLUOROURACIL CHEMO INJECTION 2.5 GM/50ML
400.0000 mg/m2 | Freq: Once | INTRAVENOUS | Status: AC
  Administered 2022-08-27: 850 mg via INTRAVENOUS
  Filled 2022-08-27: qty 17

## 2022-08-27 MED ORDER — SODIUM CHLORIDE 0.9 % IV SOLN
400.0000 mg/m2 | Freq: Once | INTRAVENOUS | Status: AC
  Administered 2022-08-27: 848 mg via INTRAVENOUS
  Filled 2022-08-27: qty 42.4

## 2022-08-27 MED ORDER — SODIUM CHLORIDE 0.9 % IV SOLN
6.0000 mg/kg | Freq: Once | INTRAVENOUS | Status: AC
  Administered 2022-08-27: 500 mg via INTRAVENOUS
  Filled 2022-08-27: qty 5

## 2022-08-27 MED ORDER — PALONOSETRON HCL INJECTION 0.25 MG/5ML
0.2500 mg | Freq: Once | INTRAVENOUS | Status: AC
  Administered 2022-08-27: 0.25 mg via INTRAVENOUS
  Filled 2022-08-27: qty 5

## 2022-08-27 MED ORDER — SODIUM CHLORIDE 0.9 % IV SOLN
180.0000 mg/m2 | Freq: Once | INTRAVENOUS | Status: AC
  Administered 2022-08-27: 400 mg via INTRAVENOUS
  Filled 2022-08-27: qty 5

## 2022-08-27 MED ORDER — SODIUM CHLORIDE 0.9% FLUSH: 10.0000 mL | INTRAVENOUS | Status: DC | PRN

## 2022-08-27 MED ORDER — CLINDAMYCIN PHOSPHATE 1 % EX LOTN: TOPICAL_LOTION | CUTANEOUS | 2 refills | Status: DC

## 2022-08-27 MED ORDER — SODIUM CHLORIDE 0.9 % IV SOLN
2400.0000 mg/m2 | INTRAVENOUS | Status: DC
  Administered 2022-08-27: 5000 mg via INTRAVENOUS
  Filled 2022-08-27: qty 100

## 2022-08-27 MED ORDER — SODIUM CHLORIDE 0.9 % IV SOLN
10.0000 mg | Freq: Once | INTRAVENOUS | Status: AC
  Administered 2022-08-27: 10 mg via INTRAVENOUS
  Filled 2022-08-27: qty 10

## 2022-08-27 MED ORDER — SODIUM CHLORIDE 0.9 % IV SOLN: Freq: Once | INTRAVENOUS | Status: AC

## 2022-08-27 NOTE — Patient Instructions (Signed)
[facility]  Discharge Instructions: Thank you for choosing [facility] to provide your oncology and hematology care.   If you have a lab appointment with the Cancer Center, please go directly to the Cancer Center and check in at the registration area.   Wear comfortable clothing and clothing appropriate for easy access to any Portacath or PICC line.   We strive to give you quality time with your provider. You may need to reschedule your appointment if you arrive late (15 or more minutes).  Arriving late affects you and other patients whose appointments are after yours.  Also, if you miss three or more appointments without notifying the office, you may be dismissed from the clinic at the provider's discretion.      For prescription refill requests, have your pharmacy contact our office and allow 72 hours for refills to be completed.    Today you received the following chemotherapy and/or immunotherapy agents Vectibix, Irinotecan, Leucovorin, 5FU      To help prevent nausea and vomiting after your treatment, we encourage you to take your nausea medication as directed.  BELOW ARE SYMPTOMS THAT SHOULD BE REPORTED IMMEDIATELY: *FEVER GREATER THAN 100.4 F (38 C) OR HIGHER *CHILLS OR SWEATING *NAUSEA AND VOMITING THAT IS NOT CONTROLLED WITH YOUR NAUSEA MEDICATION *UNUSUAL SHORTNESS OF BREATH *UNUSUAL BRUISING OR BLEEDING *URINARY PROBLEMS (pain or burning when urinating, or frequent urination) *BOWEL PROBLEMS (unusual diarrhea, constipation, pain near the anus) TENDERNESS IN MOUTH AND THROAT WITH OR WITHOUT PRESENCE OF ULCERS (sore throat, sores in mouth, or a toothache) UNUSUAL RASH, SWELLING OR PAIN  UNUSUAL VAGINAL DISCHARGE OR ITCHING   Items with * indicate a potential emergency and should be followed up as soon as possible or go to the Emergency Department if any problems should occur.  Please show the CHEMOTHERAPY ALERT CARD or  IMMUNOTHERAPY ALERT CARD at check-in to the Emergency Department and triage nurse.  Should you have questions after your visit or need to cancel or reschedule your appointment, please contact [facility]  Dept: [phone]  and follow the prompts.  Office hours are 8:00 a.m. to 4:30 p.m. Monday - Friday. Please note that voicemails left after 4:00 p.m. may not be returned until the following business day.  We are closed weekends and major holidays. You have access to a nurse at all times for urgent questions. Please call the main number to the clinic Dept: [phone] and follow the prompts.   For any non-urgent questions, you may also contact your provider using MyChart. We now offer e-Visits for anyone 38 and older to request care online for non-urgent symptoms. For details visit [email or web address].   Also download the MyChart app! Go to the app store, search "MyChart", open the app, select [facility], and log in with your MyChart username and password.

## 2022-08-27 NOTE — Progress Notes (Signed)
[facility] [phone]   [phone]  PROGRESS NOTE  Patient Care Team: [name], MD as PCP - General (Hematology and Oncology) Doreatha Massed, MD as Medical Oncologist (Medical Oncology)  DIAGNOSIS: Metastatic rectal carcinoma  ONCOLOGIC HISTORY: # Metastatic Rectal Adenocarcinoma 02/03/2022-02/11/2022: Presented to ED due to left hip pain after a mechanical fall.  02/04/2022: CT left OZD:GUYQI pathologic fracture of the left femoral neck with displacement and angulation, with underlying lytic lesions in the femoral head and neck.Additional lytic destructive lesion of the left parasymphyseal pubic bone extending throughout the superior pubic ramus to the puboacetabular junction, with pathologic fracture of the superior pubic ramus. Adjacent hypoattenuation in the pelvic component of the obturator internus muscle, suspicious for tumor involvement. Probable small lytic lesion in the inferior pubic ramus.Prominent left external iliac lymph nodes measuring up to 1.2 cm. 02/05/2022: CT CAP: Pathologic retroperitoneal and bilateral pelvic adenopathy, with lytic destructive lesions of the left superior and inferior pubic ramus and pubic body as well as a pathologic fracture through the left femoral neck. Pathologic fracture anteriorly in the right second rib. Deformity anteriorly in the right third rib probably from early pathologic fracture. Metastatic disease or myeloma strongly favored over multifocal osteomyelitis as a cause. Mildly enlarged left supraclavicular lymph node and lower thoracic periaortic lymph node. Pathologic retroperitoneal and pelvic adenopathy. 10 cm long segment of rectal wall thickening.1.1 cm exophytic lesion from the left mid kidney posterolaterally, nonspecific for complex cyst versus tumor. Bandlike atelectasis in both lungs.Small type 1 hiatal hernia.Lumbar spondylosis and degenerative disc disease causing generally mild  multilevel impingement. Sigmoid colon diverticulosis.Wall thickening in the proximal stomach body, probably secondary to nondistention. 02/06/2022: Underwent left total hip arthroplasty. Pathology revealed poorly differentiated carcinoma.  SPEP/IFE did not detect monoclonal protein. PSA normal. 02/25/2022: Underwent colonoscopy that showed malignant tumor in the rectum. Pathology confirmed poorly differentiated carcinoma.  04/05/2022: Underwent port placement 04/08/2022: Cycle 1, Day 1 of FOLFOX (held bevacizumab due to recent port placement) 04/23/2022: Cycle 2, Day 1 of FOLFOX plus bevacizumab 05/07/2022: Cycle 3, Day 1 of FOLFOX plus bevacizumab 05/21/2022: Cycle 4, Day 1 of FOLFOX plus bevacizumab. Transitioned care to Dr. Caren Hazy at [facility] 07/02/2022: Cycle 6, Day 1 of FOLFOX plus bevacizumab at [facility].  07/11/2022: NM PET CT scan showed progression of hypermetabolic osseous metastatic disease, although there is some mixed change as detailed. New hypermetabolic right femoral neck metastasis places the patient at risk for pathologic right femoral neck fracture. 07/30/2022:  Cycle 1 Day 1  FOLFIRI +Panitumumab  08/14/2022: Cycle 2 Day 1 FOLFIRI + Panitumumab 08/27/2022: Cycle 3 Day 1 FOLFIRI + Panitumumab  HISTORY OF PRESENTING ILLNESS:  Joseph Hogan 62 y.o. male returns for follow-up for metastatic rectal cancer. He presents today to start Cycle 3, Day 1 FOLFIRI +Panitumumab. He is unaccompanied for this visit.   On exam today, Joseph Hogan reports-on his face is improving.  He reports he is using the Cleocin cream as well as the doxycycline.  He also has developed a painful spot on the inner portion of his right ankle.  He notes it is not bleeding or oozing.  He does not wear shoes at home and he is not putting any pressure on that area.  He had a weight gain of 5 pounds interim since we have been here and notes he is "eating well".  He notes his constipation is also under good control while  taking senna and he is having "solid bowel  movements".  His pain is also under good control with Xtampza at once daily.  He is interested in having steroid injections in his knees.  His energy levels are strong and he has no questions concerns or complaints today.  Overall he is willing and able to proceed with chemotherapy at this time.  He denies any fevers, chills, sweats, shortness of breath, chest pain or cough..  A full 10 point ROS is otherwise negative.  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   with abnormal R wave progression noted on EKG   MRSA (methicillin resistant Staphylococcus aureus)    Pre-diabetes    Rectal carcinoma (HCC) 03/19/2022    SURGICAL HISTORY: Past Surgical History:  Procedure Laterality Date   COLONOSCOPY     HUMERUS IM NAIL Right 04/12/2022   Procedure: INTRAMEDULLARY (IM) NAIL HUMERAL;  Surgeon: Bjorn Pippin, MD;  Location: [facility];  Service: Orthopedics;  Laterality: Right;   IR IMAGING GUIDED PORT INSERTION  04/05/2022   TOTAL HIP ARTHROPLASTY Left 02/06/2022   Procedure: TOTAL HIP ARTHROPLASTY;  Surgeon: Joen Laura, MD;  Location: [facility];  Service: Orthopedics;  Laterality: Left;    SOCIAL HISTORY: Social History   Socioeconomic History   Marital status: Divorced    Spouse name: Not on file   Number of children: Not on file   Years of education: Not on file   Highest education level: Not on file  Occupational History   Not on file  Tobacco Use   Smoking status: Never   Smokeless tobacco: Never  Vaping Use   Vaping status: Never Used  Substance and Sexual Activity   Alcohol use: No   Drug use: No   Sexual activity: Not on file  Other Topics Concern   Not on file  Social History Narrative   Not on file   Social Determinants of Health   Financial Resource Strain: Not on file  Food Insecurity: No Food  Insecurity (06/10/2022)   Hunger Vital Sign    Worried About Running Out of Food in the Last Year: Never true    Ran Out of Food in the Last Year: Never true  Transportation Needs: No Transportation Needs (06/10/2022)   PRAPARE - Administrator, Civil Service (Medical): No    Lack of Transportation (Non-Medical): No  Physical Activity: Not on file  Stress: Not on file  Social Connections: Not on file  Intimate Partner Violence: Not At Risk (02/04/2022)   Humiliation, Afraid, Rape, and Kick questionnaire    Fear of Current or Ex-Partner: No    Emotionally Abused: No    Physically Abused: No    Sexually Abused: No    FAMILY HISTORY: Family History  Problem Relation Age of Onset   Stroke Mother    Hypertension Mother    Heart disease Father    Alzheimer's disease Father    Hypertension Brother    Alcohol abuse Brother    Cancer Maternal Aunt    Heart disease Maternal Aunt    Cancer Maternal Uncle    Heart disease Maternal Uncle    Cancer Paternal Aunt    Heart disease Paternal Aunt    Cancer Paternal Uncle    Heart disease Paternal Uncle    Heart disease Paternal Grandmother    Alzheimer's disease Paternal Grandfather    Stomach cancer Neg Hx  Rectal cancer Neg Hx    Esophageal cancer Neg Hx    Colon cancer Neg Hx     ALLERGIES:  is allergic to codeine and lisinopril.  MEDICATIONS:  Current Outpatient Medications  Medication Sig Dispense Refill   b complex vitamins capsule Take 1 capsule by mouth daily.     doxycycline (VIBRA-TABS) 100 MG tablet Take 1 tablet (100 mg total) by mouth 2 (two) times daily. 180 tablet 1   magnesium oxide (MAG-OX) 400 (240 Mg) MG tablet Take 400 mg by mouth daily.     Multiple Vitamin (MULTIVITAMIN WITH MINERALS) TABS tablet Take 1 tablet by mouth daily.     XTAMPZA ER 13.5 MG C12A TAKE 1 CAPSULE BY MOUTH TWICE DAILY 60 capsule 0   clindamycin (CLEOCIN-T) 1 % lotion Apply as needed for rash secondary to chemotherapy. 60 mL 2    gabapentin (NEURONTIN) 300 MG capsule Take 1 capsule (300 mg total) by mouth at bedtime. (Patient not taking: Reported on 07/31/2022) 30 capsule 1   hydrocortisone 2.5 % cream Apply topically 2 (two) times daily. (Patient not taking: Reported on 08/14/2022) 30 g 0   lidocaine-prilocaine (EMLA) cream Apply a quarter-sized amount to port a cath site and cover with plastic wrap one hour prior to infusion appointments 30 g 3   ondansetron (ZOFRAN) 8 MG tablet Take 2 tablets (16 mg total) by mouth every 8 (eight) hours as needed for nausea or vomiting. (Patient not taking: Reported on 08/14/2022) 20 tablet 3   oxyCODONE (ROXICODONE) 5 MG immediate release tablet Take 1 tablet (5 mg total) by mouth every 6 (six) hours as needed for severe pain. (Patient not taking: Reported on 08/14/2022) 60 tablet 0   prochlorperazine (COMPAZINE) 10 MG tablet Take 1 tablet (10 mg total) by mouth every 6 (six) hours as needed for nausea or vomiting. (Patient not taking: Reported on 08/14/2022) 30 tablet 3   tiZANidine (ZANAFLEX) 4 MG tablet Take 1 tablet (4 mg total) by mouth 3 (three) times daily. (Patient not taking: Reported on 08/14/2022) 20 tablet 0   VITAMIN A PO Take 1 tablet by mouth daily. (Patient not taking: Reported on 08/14/2022)     No current facility-administered medications for this visit.   Facility-Administered Medications Ordered in Other Visits  Medication Dose Route Frequency Provider Last Rate Last Admin   fluorouracil (ADRUCIL) 5,000 mg in sodium chloride 0.9 % 150 mL chemo infusion  2,400 mg/m2 (Treatment Plan Recorded) Intravenous 1 day or 1 dose [name], MD   Infusion Verify at 08/27/22 1516   sodium chloride flush (NS) 0.9 % injection 10 mL  10 mL Intracatheter PRN [name], MD        REVIEW OF SYSTEMS:   Constitutional: ( - ) fevers, ( - )  chills , ( - ) night sweats Eyes: ( - ) blurriness of vision, ( - ) double vision, ( - ) watery eyes Ears, nose, mouth, throat, and face: ( - )  mucositis, ( - ) sore throat Respiratory: ( - ) cough, ( - ) dyspnea, ( - ) wheezes Cardiovascular: ( - ) palpitation, ( - ) chest discomfort, ( - ) lower extremity swelling Gastrointestinal:  ( - ) nausea, ( - ) heartburn, ( - ) change in bowel habits Skin: ( - ) abnormal skin rashes Lymphatics: ( - ) new lymphadenopathy, ( - ) easy bruising Neurological: ( - ) numbness, ( - ) tingling, ( - ) new weaknesses Behavioral/Psych: ( - )  mood change, ( - ) new changes  All other systems were reviewed with the patient and are negative.  PHYSICAL EXAMINATION: ECOG PERFORMANCE STATUS: 1 - Symptomatic but completely ambulatory  Vitals:   08/27/22 1101  BP: 138/83  Pulse: 62  Resp: 13  Temp: 98 F (36.7 C)  SpO2: 100%   Filed Weights   08/27/22 1101  Weight: 205 lb 12.8 oz (93.4 kg)    GENERAL: well appearing male in NAD. Exam performed in wheelchair.  SKIN: skin color, texture, turgor are normal,or significant lesions. Mild erythematous rash on face. EYES: conjunctiva are pink and non-injected, sclera clear LUNGS: clear to auscultation and percussion with normal breathing effort HEART: regular rate & rhythm and no murmurs and no lower extremity edema Musculoskeletal: no cyanosis of digits and no clubbing  PSYCH: alert & oriented x 3, fluent speech NEURO: no focal motor/sensory deficits  LABORATORY DATA:  I have reviewed the data as listed    Latest Ref Rng & Units 08/27/2022   10:24 AM 08/14/2022    9:46 AM 07/31/2022    7:29 AM  CBC  WBC 4.0 - 10.5 K/uL 3.6  3.2  5.0   Hemoglobin 13.0 - 17.0 g/dL 16.1  09.6  04.5   Hematocrit 39.0 - 52.0 % 31.7  33.0  32.0   Platelets 150 - 400 K/uL 184  202  445        Latest Ref Rng & Units 08/27/2022   10:24 AM 08/14/2022    9:46 AM 07/31/2022    7:29 AM  CMP  Glucose 70 - 99 mg/dL 93  409  811   BUN 8 - 23 mg/dL 13  14  10    Creatinine 0.61 - 1.24 mg/dL 9.14  7.82  9.56   Sodium 135 - 145 mmol/L 139  139  137   Potassium 3.5 - 5.1  mmol/L 4.2  3.9  4.0   Chloride 98 - 111 mmol/L 105  105  100   CO2 22 - 32 mmol/L 28  28  28    Calcium 8.9 - 10.3 mg/dL 9.1  9.2  9.1   Total Protein 6.5 - 8.1 g/dL 6.5  6.2  6.8   Total Bilirubin 0.3 - 1.2 mg/dL 0.7  0.5  0.5   Alkaline Phos 38 - 126 U/L 117  131  111   AST 15 - 41 U/L 12  11  14    ALT 0 - 44 U/L 10  9  9       PATHOLOGY: Rectum, biopsy POORLY DIFFERENTIATED CARCINOMA. SEE NOTE. Diagnosis Note Addendum: Immunohistochemistry shows the carcinoma is positive with MOC-31, cytokeratin 20 and CDX2. Tumor is negative with cytokeratin 7, cytokeratin 5/6, p40, TTF-1, Napsin A, prostate-specific antigen and prostein. The immunophenotype is consistent with primary colorectal adenocarcinoma.  RADIOGRAPHIC STUDIES: No results found.  ASSESSMENT & PLAN Joseph Hogan is a 62 y.o. male who presents to the clinic for a follow up for rectal carcinoma.   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  --We reviewed that his cancer is incurable and mainstay treatment is chemotherapy. --Recommend chemotherapy regimen FOLFOX plus Bevacizumab q 2 weeks, started on 04/08/2022.  --progression noted on last PET/CT scan from 07/11/2022. D/c FOLFOX + Bev and start FOLFIRI + Panitumumab on 07/31/2022. PLAN: --Due for Cycle 3, Day 1 of FOLFIRI + Panitumumab today --Labs from  today reviewed and adequate for treatment. WBC 3.6, hemoglobin 10.4, MCV 89.3, and platelets of 184. Creatinine and LFTs adequate.  --Proceed with treatment today without any dose modification --CT scan to be scheduled for late August/early Sept 2024 --RTC in 2 weeks before Cycle 4, Day 1 of FOLFIRI + Panitumumab   #Acneiform rash  --Grade 1 involving face only --Secondary to panitumumab --Continue to take doxycycline 100 mg BID.  Continue Cleocin gel for spot treatment.   #Microcytic anemia: #Thrombocytosis: --Labs today stable Hgb at 10.4 --Iron panel confirmed iron deficiency.  --patient received IV venofer 200 mg once a week x 3  #Right shoulder pain # Leg Pain  # Bone Pain from Metastatic Cancer  --Right shoulder xray form 04/08/2022 showed destructive lesion within the proximal to mid right humerus shaft with acute to subacture pathologic fracture. --Patient underwent pinning of right humerus on 04/12/2022 --Scheduled for radiation oncology consultation on 04/24/2022. --Current pain regimen includes Xtampza 13.5 q 12 hours, oxycodone 5 mg for breakthrough pain.  --Since right hip pain has resolved since new chemotherapy, advised patient to taper off pain medication.   #Supportive Care -- chemotherapy education complete -- port placement complete.  -- zofran 8mg  q8H PRN and compazine 10mg  PO q6H for nausea -- EMLA cream for port  No orders of the defined types were placed in this encounter.   All questions were answered. The patient knows to call the clinic with any problems, questions or concerns.  I have spent a total of 30 minutes minutes of face-to-face and non-face-to-face time, preparing to see the patient, performing a medically appropriate examination, counseling and educating the patient, ordering medications/tests/procedures,  documenting clinical information in the electronic health record,  and care coordination.   Joseph Barns, MD Department of Hematology/Oncology [facility] at [facility] Phone: [phone] Pager: [phone] Email: [email or web address]

## 2022-08-28 ENCOUNTER — Other Ambulatory Visit: Payer: Self-pay

## 2022-08-29 ENCOUNTER — Inpatient Hospital Stay: Payer: Medicare HMO

## 2022-08-29 ENCOUNTER — Other Ambulatory Visit: Payer: Self-pay

## 2022-08-29 VITALS — BP 147/77 | HR 62 | Temp 98.6°F | Resp 16

## 2022-08-29 DIAGNOSIS — D75839 Thrombocytosis, unspecified: Secondary | ICD-10-CM | POA: Diagnosis not present

## 2022-08-29 DIAGNOSIS — G893 Neoplasm related pain (acute) (chronic): Secondary | ICD-10-CM | POA: Diagnosis not present

## 2022-08-29 DIAGNOSIS — C20 Malignant neoplasm of rectum: Secondary | ICD-10-CM | POA: Diagnosis not present

## 2022-08-29 DIAGNOSIS — C7951 Secondary malignant neoplasm of bone: Secondary | ICD-10-CM | POA: Diagnosis not present

## 2022-08-29 DIAGNOSIS — Z5112 Encounter for antineoplastic immunotherapy: Secondary | ICD-10-CM | POA: Diagnosis not present

## 2022-08-29 DIAGNOSIS — Z79899 Other long term (current) drug therapy: Secondary | ICD-10-CM | POA: Diagnosis not present

## 2022-08-29 DIAGNOSIS — R21 Rash and other nonspecific skin eruption: Secondary | ICD-10-CM | POA: Diagnosis not present

## 2022-08-29 DIAGNOSIS — D509 Iron deficiency anemia, unspecified: Secondary | ICD-10-CM | POA: Diagnosis not present

## 2022-08-29 DIAGNOSIS — Z5111 Encounter for antineoplastic chemotherapy: Secondary | ICD-10-CM | POA: Diagnosis not present

## 2022-08-29 MED ORDER — SODIUM CHLORIDE 0.9% FLUSH
10.0000 mL | INTRAVENOUS | Status: DC | PRN
  Administered 2022-08-29: 10 mL

## 2022-08-29 MED ORDER — HEPARIN SOD (PORK) LOCK FLUSH 100 UNIT/ML IV SOLN
500.0000 [IU] | Freq: Once | INTRAVENOUS | Status: AC | PRN
  Administered 2022-08-29: 500 [IU]

## 2022-09-03 ENCOUNTER — Telehealth: Payer: Self-pay

## 2022-09-03 ENCOUNTER — Other Ambulatory Visit: Payer: Self-pay

## 2022-09-03 NOTE — Telephone Encounter (Signed)
Pt called c/o rash in rt inner thigh and entire scrotum areas.  Pt stated his fax does not have a rash but is red.  Pt currently gets Panitumumab.  Pt stated Dr. Leonides Schanz prescribed Clindamycin lotion to apply to the rash.  Pt stated he's been applying the Clindamycin to his face but has not applied it to the new rash in his inner thigh & scrotum.  Pt stated the inner thigh & scrotum is burning, red, and extremely tender to touch.  Pt denied applying anything clindamycin nor hydrocortisone to the inner thigh and scrotum area.  Instructed pt to wash the area with mild soap (Dove or [place]) and water.  Likely pat the area completely dry.  If possible refrain from wearing underwear especially tight fitting underwear.  Allow area to breathe and try to keep the area cool.  Instructed pt to sit or lay with a fan blowing in that area to help keep it cool.  Pt asked if the Clindamycin lotion could be applied to the scrotum area.  Informed pt that "Yes" it can be applied.  Stated it may burn at first d/t area is inflamed/irritated but will eventually resolved as the Clindamycin starts to heal the area.  Pt verbalized understanding and had no further questions or concerns at this time.  Notified Dr. Leonides Schanz of pt's call.

## 2022-09-03 NOTE — Telephone Encounter (Signed)
LVM for pt stating that Dr. Leonides Schanz responded to this nurses message regarding pt's call earlier today.  Stated that Dr. Leonides Schanz is in agreement with the recommendations/instructions this nurse gave regarding pt's rash.  Stated if the rash gets worse, to contact Dr. Derek M[place] office for further instructions.  Reminded pt he has a f/u appt on 07/31 w/Dr. Leonides Schanz.  Instructed pt to contact Dr. Derek Mound office should he have additional questions or concerns.

## 2022-09-10 MED FILL — Dexamethasone Sodium Phosphate Inj 100 MG/10ML: INTRAMUSCULAR | Qty: 1 | Status: AC

## 2022-09-11 ENCOUNTER — Inpatient Hospital Stay: Payer: Medicare HMO

## 2022-09-11 ENCOUNTER — Inpatient Hospital Stay: Payer: Medicare HMO | Admitting: Physician Assistant

## 2022-09-11 ENCOUNTER — Other Ambulatory Visit: Payer: Self-pay

## 2022-09-11 VITALS — BP 157/86 | HR 55 | Temp 97.9°F | Resp 20 | Wt 206.3 lb

## 2022-09-11 DIAGNOSIS — C20 Malignant neoplasm of rectum: Secondary | ICD-10-CM | POA: Diagnosis not present

## 2022-09-11 DIAGNOSIS — Z5111 Encounter for antineoplastic chemotherapy: Secondary | ICD-10-CM

## 2022-09-11 DIAGNOSIS — Z5112 Encounter for antineoplastic immunotherapy: Secondary | ICD-10-CM | POA: Diagnosis not present

## 2022-09-11 DIAGNOSIS — L708 Other acne: Secondary | ICD-10-CM

## 2022-09-11 DIAGNOSIS — R21 Rash and other nonspecific skin eruption: Secondary | ICD-10-CM | POA: Diagnosis not present

## 2022-09-11 DIAGNOSIS — G893 Neoplasm related pain (acute) (chronic): Secondary | ICD-10-CM | POA: Diagnosis not present

## 2022-09-11 DIAGNOSIS — Z79899 Other long term (current) drug therapy: Secondary | ICD-10-CM | POA: Diagnosis not present

## 2022-09-11 DIAGNOSIS — C7951 Secondary malignant neoplasm of bone: Secondary | ICD-10-CM | POA: Diagnosis not present

## 2022-09-11 DIAGNOSIS — D75839 Thrombocytosis, unspecified: Secondary | ICD-10-CM | POA: Diagnosis not present

## 2022-09-11 DIAGNOSIS — D509 Iron deficiency anemia, unspecified: Secondary | ICD-10-CM | POA: Diagnosis not present

## 2022-09-11 DIAGNOSIS — Z95828 Presence of other vascular implants and grafts: Secondary | ICD-10-CM

## 2022-09-11 LAB — CBC WITH DIFFERENTIAL (CANCER CENTER ONLY)
Abs Immature Granulocytes: 0.03 10*3/uL (ref 0.00–0.07)
Basophils Absolute: 0 10*3/uL (ref 0.0–0.1)
Basophils Relative: 1 %
Eosinophils Absolute: 0.2 10*3/uL (ref 0.0–0.5)
Eosinophils Relative: 5 %
HCT: 32.7 % — ABNORMAL LOW (ref 39.0–52.0)
Hemoglobin: 11 g/dL — ABNORMAL LOW (ref 13.0–17.0)
Immature Granulocytes: 1 %
Lymphocytes Relative: 15 %
Lymphs Abs: 0.5 10*3/uL — ABNORMAL LOW (ref 0.7–4.0)
MCH: 30.4 pg (ref 26.0–34.0)
MCHC: 33.6 g/dL (ref 30.0–36.0)
MCV: 90.3 fL (ref 80.0–100.0)
Monocytes Absolute: 0.5 10*3/uL (ref 0.1–1.0)
Monocytes Relative: 13 %
Neutro Abs: 2.3 10*3/uL (ref 1.7–7.7)
Neutrophils Relative %: 65 %
Platelet Count: 214 10*3/uL (ref 150–400)
RBC: 3.62 MIL/uL — ABNORMAL LOW (ref 4.22–5.81)
RDW: 18.1 % — ABNORMAL HIGH (ref 11.5–15.5)
WBC Count: 3.6 10*3/uL — ABNORMAL LOW (ref 4.0–10.5)
nRBC: 0 % (ref 0.0–0.2)

## 2022-09-11 LAB — CMP (CANCER CENTER ONLY)
ALT: 10 U/L (ref 0–44)
AST: 12 U/L — ABNORMAL LOW (ref 15–41)
Albumin: 3.9 g/dL (ref 3.5–5.0)
Alkaline Phosphatase: 97 U/L (ref 38–126)
Anion gap: 6 (ref 5–15)
BUN: 18 mg/dL (ref 8–23)
CO2: 27 mmol/L (ref 22–32)
Calcium: 9.2 mg/dL (ref 8.9–10.3)
Chloride: 104 mmol/L (ref 98–111)
Creatinine: 0.51 mg/dL — ABNORMAL LOW (ref 0.61–1.24)
GFR, Estimated: >60 mL/min (ref >60)
Glucose, Bld: 103 mg/dL — ABNORMAL HIGH (ref 70–99)
Potassium: 4.3 mmol/L (ref 3.5–5.1)
Sodium: 137 mmol/L (ref 135–145)
Total Bilirubin: 0.5 mg/dL (ref 0.3–1.2)
Total Protein: 6.7 g/dL (ref 6.5–8.1)

## 2022-09-11 LAB — MAGNESIUM: Magnesium: 1.9 mg/dL (ref 1.7–2.4)

## 2022-09-11 MED ORDER — SODIUM CHLORIDE 0.9 % IV SOLN
400.0000 mg/m2 | Freq: Once | INTRAVENOUS | Status: AC
  Administered 2022-09-11: 848 mg via INTRAVENOUS
  Filled 2022-09-11: qty 42.4

## 2022-09-11 MED ORDER — CLOTRIMAZOLE 1 % EX LOTN: TOPICAL_LOTION | CUTANEOUS | 1 refills | Status: DC

## 2022-09-11 MED ORDER — SODIUM CHLORIDE 0.9% FLUSH
10.0000 mL | Freq: Once | INTRAVENOUS | Status: AC | PRN
  Administered 2022-09-11: 10 mL

## 2022-09-11 MED ORDER — SODIUM CHLORIDE 0.9 % IV SOLN: Freq: Once | INTRAVENOUS | Status: AC

## 2022-09-11 MED ORDER — PALONOSETRON HCL INJECTION 0.25 MG/5ML
0.2500 mg | Freq: Once | INTRAVENOUS | Status: AC
  Administered 2022-09-11: 0.25 mg via INTRAVENOUS
  Filled 2022-09-11: qty 5

## 2022-09-11 MED ORDER — SODIUM CHLORIDE 0.9 % IV SOLN
180.0000 mg/m2 | Freq: Once | INTRAVENOUS | Status: AC
  Administered 2022-09-11: 400 mg via INTRAVENOUS
  Filled 2022-09-11: qty 5

## 2022-09-11 MED ORDER — SODIUM CHLORIDE 0.9 % IV SOLN
10.0000 mg | Freq: Once | INTRAVENOUS | Status: AC
  Administered 2022-09-11: 10 mg via INTRAVENOUS
  Filled 2022-09-11: qty 10

## 2022-09-11 MED ORDER — SODIUM CHLORIDE 0.9% FLUSH: 10.0000 mL | INTRAVENOUS | Status: DC | PRN

## 2022-09-11 MED ORDER — SODIUM CHLORIDE 0.9 % IV SOLN
2400.0000 mg/m2 | INTRAVENOUS | Status: DC
  Administered 2022-09-11: 5000 mg via INTRAVENOUS
  Filled 2022-09-11: qty 100

## 2022-09-11 MED ORDER — HEPARIN SOD (PORK) LOCK FLUSH 100 UNIT/ML IV SOLN: 500.0000 [IU] | Freq: Once | INTRAVENOUS | Status: DC | PRN

## 2022-09-11 MED ORDER — FLUOROURACIL CHEMO INJECTION 2.5 GM/50ML
400.0000 mg/m2 | Freq: Once | INTRAVENOUS | Status: AC
  Administered 2022-09-11: 850 mg via INTRAVENOUS
  Filled 2022-09-11: qty 17

## 2022-09-11 MED ORDER — ATROPINE SULFATE 1 MG/ML IV SOLN: 0.5000 mg | Freq: Once | INTRAVENOUS | Status: DC | PRN

## 2022-09-11 NOTE — Progress Notes (Signed)
[facility] [phone]) [phone]   [phone]  PROGRESS NOTE  Patient Care Team: [name], MD as PCP - General (Hematology and Oncology) Doreatha Massed, MD as Medical Oncologist (Medical Oncology)  DIAGNOSIS: Metastatic rectal carcinoma  ONCOLOGIC HISTORY: # Metastatic Rectal Adenocarcinoma 02/03/2022-02/11/2022: Presented to ED due to left hip pain after a mechanical fall.  02/04/2022: CT left AVW:UJWJX pathologic fracture of the left femoral neck with displacement and angulation, with underlying lytic lesions in the femoral head and neck.Additional lytic destructive lesion of the left parasymphyseal pubic bone extending throughout the superior pubic ramus to the puboacetabular junction, with pathologic fracture of the superior pubic ramus. Adjacent hypoattenuation in the pelvic component of the obturator internus muscle, suspicious for tumor involvement. Probable small lytic lesion in the inferior pubic ramus.Prominent left external iliac lymph nodes measuring up to 1.2 cm. 02/05/2022: CT CAP: Pathologic retroperitoneal and bilateral pelvic adenopathy, with lytic destructive lesions of the left superior and inferior pubic ramus and pubic body as well as a pathologic fracture through the left femoral neck. Pathologic fracture anteriorly in the right second rib. Deformity anteriorly in the right third rib probably from early pathologic fracture. Metastatic disease or myeloma strongly favored over multifocal osteomyelitis as a cause. Mildly enlarged left supraclavicular lymph node and lower thoracic periaortic lymph node. Pathologic retroperitoneal and pelvic adenopathy. 10 cm long segment of rectal wall thickening.1.1 cm exophytic lesion from the left mid kidney posterolaterally, nonspecific for complex cyst versus tumor. Bandlike atelectasis in both lungs.Small type 1 hiatal hernia.Lumbar spondylosis and degenerative disc disease causing generally mild  multilevel impingement. Sigmoid colon diverticulosis.Wall thickening in the proximal stomach body, probably secondary to nondistention. 02/06/2022: Underwent left total hip arthroplasty. Pathology revealed poorly differentiated carcinoma.  SPEP/IFE did not detect monoclonal protein. PSA normal. 02/25/2022: Underwent colonoscopy that showed malignant tumor in the rectum. Pathology confirmed poorly differentiated carcinoma.  04/05/2022: Underwent port placement 04/08/2022: Cycle 1, Day 1 of FOLFOX (held bevacizumab due to recent port placement) 04/23/2022: Cycle 2, Day 1 of FOLFOX plus bevacizumab 05/07/2022: Cycle 3, Day 1 of FOLFOX plus bevacizumab 05/21/2022: Cycle 4, Day 1 of FOLFOX plus bevacizumab. Transitioned care to Dr. Caren Hazy at [facility] 07/02/2022: Cycle 6, Day 1 of FOLFOX plus bevacizumab at [facility]  07/11/2022: NM PET CT scan showed progression of hypermetabolic osseous metastatic disease, although there is some mixed change as detailed. New hypermetabolic right femoral neck metastasis places the patient at risk for pathologic right femoral neck fracture. 07/30/2022:  Cycle 1 Day 1  FOLFIRI +Panitumumab  08/14/2022: Cycle 2 Day 1 FOLFIRI + Panitumumab 08/27/2022: Cycle 3 Day 1 FOLFIRI + Panitumumab 09/11/2022: Cycle 4 Day 1 FOLFIRI (Hold Panitumumab due to acneiform rash)  HISTORY OF PRESENTING ILLNESS:  Joseph Hogan 62 y.o. male returns for follow-up for metastatic rectal cancer. He presents today to start Cycle 4, Day 1 FOLFIRI +Panitumumab. He is unaccompanied for this visit.   On exam today, Joseph Hogan reports the rash on his face is worsening and causing a burning sensation. He reports a new rash involving his groin that impacts his walking. He is compliant with taking his doxycycline and cleocin gel. He reports his energy and appetite are good. He denies nausea, vomiting or bowel habit changes. He denies any signs of active bleeding. He denies any fevers, chills, sweats, shortness  of breath, chest pain or cough..  A full 10 point ROS is otherwise negative.  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   with abnormal R wave progression noted on EKG   MRSA (methicillin resistant Staphylococcus aureus)    Pre-diabetes    Rectal carcinoma (HCC) 03/19/2022    SURGICAL HISTORY: Past Surgical History:  Procedure Laterality Date   COLONOSCOPY     HUMERUS IM NAIL Right 04/12/2022   Procedure: INTRAMEDULLARY (IM) NAIL HUMERAL;  Surgeon: Bjorn Pippin, MD;  Location: [facility];  Service: Orthopedics;  Laterality: Right;   IR IMAGING GUIDED PORT INSERTION  04/05/2022   TOTAL HIP ARTHROPLASTY Left 02/06/2022   Procedure: TOTAL HIP ARTHROPLASTY;  Surgeon: Joen Laura, MD;  Location: [facility];  Service: Orthopedics;  Laterality: Left;    SOCIAL HISTORY: Social History   Socioeconomic History   Marital status: Divorced    Spouse name: Not on file   Number of children: Not on file   Years of education: Not on file   Highest education level: Not on file  Occupational History   Not on file  Tobacco Use   Smoking status: Never   Smokeless tobacco: Never  Vaping Use   Vaping status: Never Used  Substance and Sexual Activity   Alcohol use: No   Drug use: No   Sexual activity: Not on file  Other Topics Concern   Not on file  Social History Narrative   Not on file   Social Determinants of Health   Financial Resource Strain: Not on file  Food Insecurity: No Food Insecurity (06/10/2022)   Hunger Vital Sign    Worried About Running Out of Food in the Last Year: Never true    Ran Out of Food in the Last Year: Never true  Transportation Needs: No Transportation Needs (06/10/2022)   PRAPARE - Administrator, Civil Service (Medical): No    Lack of Transportation (Non-Medical): No  Physical Activity: Not on file  Stress: Not on file  Social  Connections: Not on file  Intimate Partner Violence: Not At Risk (02/04/2022)   Humiliation, Afraid, Rape, and Kick questionnaire    Fear of Current or Ex-Partner: No    Emotionally Abused: No    Physically Abused: No    Sexually Abused: No    FAMILY HISTORY: Family History  Problem Relation Age of Onset   Stroke Mother    Hypertension Mother    Heart disease Father    Alzheimer's disease Father    Hypertension Brother    Alcohol abuse Brother    Cancer Maternal Aunt    Heart disease Maternal Aunt    Cancer Maternal Uncle    Heart disease Maternal Uncle    Cancer Paternal Aunt    Heart disease Paternal Aunt    Cancer Paternal Uncle    Heart disease Paternal Uncle    Heart disease Paternal Grandmother    Alzheimer's disease Paternal Grandfather    Stomach cancer Neg Hx    Rectal cancer Neg Hx    Esophageal cancer Neg Hx    Colon cancer Neg Hx     ALLERGIES:  is allergic to codeine and lisinopril.  MEDICATIONS:  Current Outpatient Medications  Medication Sig Dispense Refill   Clotrimazole 1 % LOTN Apply to groin rash twice per day 30 mL 1   doxycycline (VIBRA-TABS) 100 MG tablet Take 1 tablet (100 mg total) by mouth 2 (two) times daily. 180 tablet 1   oxyCODONE (ROXICODONE) 5 MG immediate  release tablet Take 1 tablet (5 mg total) by mouth every 6 (six) hours as needed for severe pain. 60 tablet 0   b complex vitamins capsule Take 1 capsule by mouth daily. (Patient not taking: Reported on 09/11/2022)     clindamycin (CLEOCIN-T) 1 % lotion Apply as needed for rash secondary to chemotherapy. (Patient not taking: Reported on 09/11/2022) 60 mL 2   gabapentin (NEURONTIN) 300 MG capsule Take 1 capsule (300 mg total) by mouth at bedtime. (Patient not taking: Reported on 07/31/2022) 30 capsule 1   hydrocortisone 2.5 % cream Apply topically 2 (two) times daily. (Patient not taking: Reported on 08/14/2022) 30 g 0   lidocaine-prilocaine (EMLA) cream Apply a quarter-sized amount to port a  cath site and cover with plastic wrap one hour prior to infusion appointments (Patient not taking: Reported on 09/11/2022) 30 g 3   magnesium oxide (MAG-OX) 400 (240 Mg) MG tablet Take 400 mg by mouth daily. (Patient not taking: Reported on 09/11/2022)     Multiple Vitamin (MULTIVITAMIN WITH MINERALS) TABS tablet Take 1 tablet by mouth daily. (Patient not taking: Reported on 09/11/2022)     ondansetron (ZOFRAN) 8 MG tablet Take 2 tablets (16 mg total) by mouth every 8 (eight) hours as needed for nausea or vomiting. (Patient not taking: Reported on 08/14/2022) 20 tablet 3   prochlorperazine (COMPAZINE) 10 MG tablet Take 1 tablet (10 mg total) by mouth every 6 (six) hours as needed for nausea or vomiting. (Patient not taking: Reported on 08/14/2022) 30 tablet 3   tiZANidine (ZANAFLEX) 4 MG tablet Take 1 tablet (4 mg total) by mouth 3 (three) times daily. (Patient not taking: Reported on 08/14/2022) 20 tablet 0   VITAMIN A PO Take 1 tablet by mouth daily. (Patient not taking: Reported on 08/14/2022)     XTAMPZA ER 13.5 MG C12A TAKE 1 CAPSULE BY MOUTH TWICE DAILY (Patient not taking: Reported on 09/11/2022) 60 capsule 0   No current facility-administered medications for this visit.   Facility-Administered Medications Ordered in Other Visits  Medication Dose Route Frequency Provider Last Rate Last Admin   atropine injection 0.5 mg  0.5 mg Intravenous Once PRN [name], MD       fluorouracil (ADRUCIL) 5,000 mg in sodium chloride 0.9 % 150 mL chemo infusion  2,400 mg/m2 (Treatment Plan Recorded) Intravenous 1 day or 1 dose [name], MD       fluorouracil (ADRUCIL) chemo injection 850 mg  400 mg/m2 (Treatment Plan Recorded) Intravenous Once [name], MD       heparin lock flush 100 unit/mL  500 Units Intracatheter Once PRN [name], MD       irinotecan (CAMPTOSAR) 400 mg in sodium chloride 0.9 % 500 mL chemo infusion  180 mg/m2 (Treatment Plan Recorded) Intravenous Once [name], MD       leucovorin 848 mg in sodium chloride 0.9 % 250 mL infusion  400 mg/m2 (Treatment Plan Recorded) Intravenous Once Ulysees Barns IV, MD       sodium chloride flush (NS) 0.9 % injection 10 mL  10 mL Intracatheter PRN [name], MD        REVIEW OF SYSTEMS:   Constitutional: ( - ) fevers, ( - )  chills , ( - ) night sweats Eyes: ( - ) blurriness of vision, ( - ) double vision, ( - ) watery eyes Ears, nose, mouth, throat, and face: ( - )  mucositis, ( - ) sore throat Respiratory: ( - ) cough, ( - ) dyspnea, ( - ) wheezes Cardiovascular: ( - ) palpitation, ( - ) chest discomfort, ( - ) lower extremity swelling Gastrointestinal:  ( - ) nausea, ( - ) heartburn, ( - ) change in bowel habits Skin: ( + ) abnormal skin rashes Lymphatics: ( - ) new lymphadenopathy, ( - ) easy bruising Neurological: ( - ) numbness, ( - ) tingling, ( - ) new weaknesses Behavioral/Psych: ( - ) mood change, ( - ) new changes  All other systems were reviewed with the patient and are negative.  PHYSICAL EXAMINATION: ECOG PERFORMANCE STATUS: 1 - Symptomatic but completely ambulatory  Vitals:   09/11/22 1055  BP: (!) 157/86  Pulse: (!) 55  Resp: 20  Temp: 97.9 F (36.6 C)  SpO2: 100%   Filed Weights   09/11/22 1055  Weight: 206 lb 4.8 oz (93.6 kg)    GENERAL: well appearing male in NAD. Exam performed in wheelchair.  SKIN: skin color, texture, turgor are normal,or significant lesions. Acneiform, erythematous rash on face, chest, scalp. Erythematous rash involving groin.  EYES: conjunctiva are pink and non-injected, sclera clear LUNGS: clear to auscultation and percussion with normal breathing effort HEART: regular rate & rhythm and no murmurs and no lower extremity edema Musculoskeletal: no cyanosis of digits and no clubbing  PSYCH: alert & oriented x 3, fluent speech NEURO: no focal motor/sensory deficits  LABORATORY DATA:  I have reviewed the data as listed    Latest Ref Rng & Units  09/11/2022   10:36 AM 08/27/2022   10:24 AM 08/14/2022    9:46 AM  CBC  WBC 4.0 - 10.5 K/uL 3.6  3.6  3.2   Hemoglobin 13.0 - 17.0 g/dL 24.4  01.0  27.2   Hematocrit 39.0 - 52.0 % 32.7  31.7  33.0   Platelets 150 - 400 K/uL 214  184  202        Latest Ref Rng & Units 09/11/2022   10:36 AM 08/27/2022   10:24 AM 08/14/2022    9:46 AM  CMP  Glucose 70 - 99 mg/dL 536  93  644   BUN 8 - 23 mg/dL 18  13  14    Creatinine 0.61 - 1.24 mg/dL 0.34  7.42  5.95   Sodium 135 - 145 mmol/L 137  139  139   Potassium 3.5 - 5.1 mmol/L 4.3  4.2  3.9   Chloride 98 - 111 mmol/L 104  105  105   CO2 22 - 32 mmol/L 27  28  28    Calcium 8.9 - 10.3 mg/dL 9.2  9.1  9.2   Total Protein 6.5 - 8.1 g/dL 6.7  6.5  6.2   Total Bilirubin 0.3 - 1.2 mg/dL 0.5  0.7  0.5   Alkaline Phos 38 - 126 U/L 97  117  131   AST 15 - 41 U/L 12  12  11    ALT 0 - 44 U/L 10  10  9       PATHOLOGY: Rectum, biopsy POORLY DIFFERENTIATED CARCINOMA. SEE NOTE. Diagnosis Note Addendum: Immunohistochemistry shows the carcinoma is positive with MOC-31, cytokeratin 20 and CDX2. Tumor is negative with cytokeratin 7, cytokeratin 5/6, p40, TTF-1, Napsin A, prostate-specific antigen and prostein. The immunophenotype is consistent with primary colorectal adenocarcinoma.  RADIOGRAPHIC STUDIES: No results found.  ASSESSMENT & PLAN Joseph Hogan is a 62 y.o. male who presents to the clinic for a follow  up for rectal carcinoma.   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  --We reviewed that his cancer is incurable and mainstay treatment is chemotherapy. --Recommend chemotherapy regimen FOLFOX plus Bevacizumab q 2 weeks, started on 04/08/2022.  --progression noted on last PET/CT scan from 07/11/2022. D/c FOLFOX + Bev and start FOLFIRI + Panitumumab on  07/31/2022. PLAN: --Due for Cycle 4, Day 1 of FOLFIRI + Panitumumab today --Labs from today reviewed and adequate for treatment. WBC 3.6, hemoglobin 11.0, MCV 90.3, and platelets of 214. Creatinine and LFTs adequate.  --Hold panitumumab today due to worsening acneiform rash. Proceed with FOLFIRI alone today.  --CT scan to be scheduled for late August/early Sept 2024 --RTC in 2 weeks before Cycle 5, Day 1 of FOLFIRI +/- Panitumumab   #Acneiform rash  --Grade 2-3 --Secondary to panitumumab --Holding panitumbab today and once rash improves to a grade 1, we will dose reduce from 6 mg/kg to 4 mg/kg.  --Continue to take doxycycline 100 mg BID. Continue Cleocin gel for spot treatment.   #Groin rash: --Could be secondary to panitumumab but also tinea cruris.  --Sent topic antifungal cream to apply twice daily  #Microcytic anemia: #Thrombocytosis: --Hgb improving to 11.0 today --patient received IV venofer 200 mg once a week x 3  #Right shoulder pain # Leg Pain  # Bone Pain from Metastatic Cancer  --Right shoulder xray form 04/08/2022 showed destructive lesion within the proximal to mid right humerus shaft with acute to subacture pathologic fracture. --Patient underwent pinning of right humerus on 04/12/2022 --Received palliative radiation from 05/14/2022-06/03/2022 to right humerus, L3 spine and pelvis --Current pain regimen includes Xtampza 13.5 q 12 hours, oxycodone 5 mg for breakthrough pain.    #Supportive Care -- chemotherapy education complete -- port placement complete.  -- zofran 8mg  q8H PRN and compazine 10mg  PO q6H for nausea -- EMLA cream for port  No orders of the defined types were placed in this encounter.   All questions were answered. The patient knows to call the clinic with any problems, questions or concerns.  I have spent a total of 30 minutes minutes of face-to-face and non-face-to-face time, preparing to see the patient, performing a medically appropriate  examination, counseling and educating the patient, ordering medications/tests/procedures,  documenting clinical information in the electronic health record,  and care coordination.   Georga Kaufmann PA-C Dept of Hematology and Oncology [facility] Phone: [phone]

## 2022-09-11 NOTE — Patient Instructions (Addendum)
Joseph Hogan  Discharge Instructions: Thank you for choosing [facility] to provide your oncology and hematology care.   If you have a lab appointment with the [facility], please go directly to the [facility] and check in at the registration area.   Wear comfortable clothing and clothing appropriate for easy access to any Portacath or PICC line.   We strive to give you quality time with your provider. You may need to reschedule your appointment if you arrive late (15 or more minutes).  Arriving late affects you and other patients whose appointments are after yours.  Also, if you miss three or more appointments without notifying the office, you may be dismissed from the clinic at the provider's discretion.      For prescription refill requests, have your pharmacy contact our office and allow 72 hours for refills to be completed.    Today you received the following chemotherapy and/or immunotherapy agents: Irinotecan, Leucovorin, Fluorouracil.       To help prevent nausea and vomiting after your treatment, we encourage you to take your nausea medication as directed.  BELOW ARE SYMPTOMS THAT SHOULD BE REPORTED IMMEDIATELY: *FEVER GREATER THAN 100.4 F (38 C) OR HIGHER *CHILLS OR SWEATING *NAUSEA AND VOMITING THAT IS NOT CONTROLLED WITH YOUR NAUSEA MEDICATION *UNUSUAL SHORTNESS OF BREATH *UNUSUAL BRUISING OR BLEEDING *URINARY PROBLEMS (pain or burning when urinating, or frequent urination) *BOWEL PROBLEMS (unusual diarrhea, constipation, pain near the anus) TENDERNESS IN MOUTH AND THROAT WITH OR WITHOUT PRESENCE OF ULCERS (sore throat, sores in mouth, or a toothache) UNUSUAL RASH, SWELLING OR PAIN  UNUSUAL VAGINAL DISCHARGE OR ITCHING   Items with * indicate a potential emergency and should be followed up as soon as possible or go to the Emergency Department if any problems should occur.  Please show the CHEMOTHERAPY ALERT CARD or  IMMUNOTHERAPY ALERT CARD at check-in to the Emergency Department and triage nurse.  Should you have questions after your visit or need to cancel or reschedule your appointment, please contact [facility]  Dept: [phone]  and follow the prompts.  Office hours are 8:00 a.m. to 4:30 p.m. Monday - Friday. Please note that voicemails left after 4:00 p.m. may not be returned until the following business day.  We are closed weekends and major holidays. You have access to a nurse at all times for urgent questions. Please call the main number to the clinic Dept: [phone] and follow the prompts.   For any non-urgent questions, you may also contact your provider using MyChart. We now offer e-Visits for anyone 25 and older to request care online for non-urgent symptoms. For details visit [email or web address].   Also download the MyChart app! Go to the app store, search "MyChart", open the app, select Joseph Hogan, and log in with your MyChart username and password.

## 2022-09-13 ENCOUNTER — Other Ambulatory Visit: Payer: Self-pay

## 2022-09-13 ENCOUNTER — Inpatient Hospital Stay: Payer: Medicare HMO | Attending: Radiation Oncology

## 2022-09-13 VITALS — BP 147/82 | HR 62 | Temp 98.2°F | Resp 18

## 2022-09-13 DIAGNOSIS — G893 Neoplasm related pain (acute) (chronic): Secondary | ICD-10-CM | POA: Diagnosis not present

## 2022-09-13 DIAGNOSIS — R21 Rash and other nonspecific skin eruption: Secondary | ICD-10-CM | POA: Insufficient documentation

## 2022-09-13 DIAGNOSIS — C779 Secondary and unspecified malignant neoplasm of lymph node, unspecified: Secondary | ICD-10-CM | POA: Diagnosis not present

## 2022-09-13 DIAGNOSIS — D509 Iron deficiency anemia, unspecified: Secondary | ICD-10-CM | POA: Diagnosis not present

## 2022-09-13 DIAGNOSIS — Z5111 Encounter for antineoplastic chemotherapy: Secondary | ICD-10-CM | POA: Diagnosis not present

## 2022-09-13 DIAGNOSIS — C7951 Secondary malignant neoplasm of bone: Secondary | ICD-10-CM | POA: Diagnosis not present

## 2022-09-13 DIAGNOSIS — Z95828 Presence of other vascular implants and grafts: Secondary | ICD-10-CM

## 2022-09-13 DIAGNOSIS — C20 Malignant neoplasm of rectum: Secondary | ICD-10-CM | POA: Diagnosis not present

## 2022-09-13 MED ORDER — HEPARIN SOD (PORK) LOCK FLUSH 100 UNIT/ML IV SOLN
500.0000 [IU] | Freq: Once | INTRAVENOUS | Status: AC | PRN
  Administered 2022-09-13: 500 [IU]

## 2022-09-13 MED ORDER — HEPARIN SOD (PORK) LOCK FLUSH 100 UNIT/ML IV SOLN: 500.0000 [IU] | Freq: Once | INTRAVENOUS | Status: DC | PRN

## 2022-09-13 MED ORDER — SODIUM CHLORIDE 0.9% FLUSH: 10.0000 mL | Freq: Once | INTRAVENOUS | Status: DC | PRN

## 2022-09-13 MED ORDER — SODIUM CHLORIDE 0.9% FLUSH
10.0000 mL | INTRAVENOUS | Status: DC | PRN
  Administered 2022-09-13: 10 mL

## 2022-09-14 ENCOUNTER — Other Ambulatory Visit: Payer: Self-pay

## 2022-09-15 ENCOUNTER — Emergency Department (HOSPITAL_COMMUNITY)
Admission: EM | Admit: 2022-09-15 | Discharge: 2022-09-15 | Disposition: A | Discharge status: Home / Self Care | Payer: Medicare HMO | Attending: Emergency Medicine | Admitting: Emergency Medicine

## 2022-09-15 ENCOUNTER — Encounter (HOSPITAL_COMMUNITY): Payer: Self-pay | Admitting: Emergency Medicine

## 2022-09-15 ENCOUNTER — Other Ambulatory Visit: Payer: Self-pay

## 2022-09-15 DIAGNOSIS — L708 Other acne: Secondary | ICD-10-CM

## 2022-09-15 DIAGNOSIS — L702 Acne varioliformis: Secondary | ICD-10-CM | POA: Insufficient documentation

## 2022-09-15 DIAGNOSIS — R21 Rash and other nonspecific skin eruption: Secondary | ICD-10-CM | POA: Diagnosis not present

## 2022-09-15 MED ORDER — HYDROCODONE-ACETAMINOPHEN 5-325 MG PO TABS
1.0000 | ORAL_TABLET | Freq: Once | ORAL | Status: AC
  Administered 2022-09-15: 1 via ORAL
  Filled 2022-09-15: qty 1

## 2022-09-15 MED ORDER — DEXAMETHASONE 4 MG PO TABS
10.0000 mg | ORAL_TABLET | Freq: Once | ORAL | Status: AC
  Administered 2022-09-15: 10 mg via ORAL
  Filled 2022-09-15: qty 1

## 2022-09-15 MED ORDER — OXYCODONE HCL 5 MG PO TABS: 5.0000 mg | ORAL_TABLET | Freq: Four times a day (QID) | ORAL | 0 refills | Status: DC | PRN

## 2022-09-15 NOTE — Discharge Instructions (Signed)
Please continue using clindamycin topically and taking doxycycline tablets.  Also, apply hydrocortisone cream.  It may be soothing if you keep the hydrocortisone cream in the refrigerator.  Please work with your oncologist regarding ongoing care of the rash as well as consideration for referral to a dermatologist.

## 2022-09-15 NOTE — ED Triage Notes (Signed)
Patient coming to ED for evaluation of allergic reaction to chemo.  Reports reaction happened 2 weeks ago after receiving a new chemo drug.  Has burning and severe pain to face.  Redness, scaling, crusting, and oozing noted to face as a result of medication reaction.

## 2022-09-15 NOTE — ED Provider Notes (Signed)
[place] EMERGENCY DEPARTMENT AT [facility] Provider Note   CSN: [id number] Arrival date & time: 09/15/22  0008     History  Chief Complaint  Patient presents with   Medication Reaction    Joseph Hogan is a 62 y.o. male.  The history is provided by the patient.  He has history of metastatic rectal cancer and developed a facial rash from panitumumab.  This has been treated with topical clindamycin and oral doxycycline as well as topical hydrocortisone.  Rash was present on the groin as well.  The groin rash is improved but the facial rash has not.  It was burning tonight to where he was unable to get to sleep.  He denies any difficulty breathing or swallowing.   Home Medications Prior to Admission medications   Medication Sig Start Date End Date Taking? Authorizing Provider  b complex vitamins capsule Take 1 capsule by mouth daily. Patient not taking: Reported on 09/11/2022    [provider]  clindamycin (CLEOCIN-T) 1 % lotion Apply as needed for rash secondary to chemotherapy. Patient not taking: Reported on 09/11/2022 08/27/22   [name], MD  Clotrimazole 1 % LOTN Apply to groin rash twice per day 09/11/22   Georga Kaufmann T, PA-C  doxycycline (VIBRA-TABS) 100 MG tablet Take 1 tablet (100 mg total) by mouth 2 (two) times daily. 07/31/22   [name], MD  gabapentin (NEURONTIN) 300 MG capsule Take 1 capsule (300 mg total) by mouth at bedtime. Patient not taking: Reported on 07/31/2022 06/06/22   Ulysees Barns IV, MD  hydrocortisone 2.5 % cream Apply topically 2 (two) times daily. Patient not taking: Reported on 08/14/2022 08/06/22   [name], MD  lidocaine-prilocaine (EMLA) cream Apply a quarter-sized amount to port a cath site and cover with plastic wrap one hour prior to infusion appointments Patient not taking: Reported on 09/11/2022 08/27/22   [name], MD  magnesium oxide (MAG-OX) 400 (240 Mg) MG tablet Take 400 mg by mouth  daily. Patient not taking: Reported on 09/11/2022    [provider]  Multiple Vitamin (MULTIVITAMIN WITH MINERALS) TABS tablet Take 1 tablet by mouth daily. Patient not taking: Reported on 09/11/2022    [provider]  ondansetron (ZOFRAN) 8 MG tablet Take 2 tablets (16 mg total) by mouth every 8 (eight) hours as needed for nausea or vomiting. Patient not taking: Reported on 08/14/2022 08/02/22   [name], MD  oxyCODONE (ROXICODONE) 5 MG immediate release tablet Take 1 tablet (5 mg total) by mouth every 6 (six) hours as needed for severe pain. 09/15/22   Dione Booze, MD  prochlorperazine (COMPAZINE) 10 MG tablet Take 1 tablet (10 mg total) by mouth every 6 (six) hours as needed for nausea or vomiting. Patient not taking: Reported on 08/14/2022 08/02/22   [name], MD  tiZANidine (ZANAFLEX) 4 MG tablet Take 1 tablet (4 mg total) by mouth 3 (three) times daily. Patient not taking: Reported on 08/14/2022 07/31/22   [name], MD  VITAMIN A PO Take 1 tablet by mouth daily. Patient not taking: Reported on 08/14/2022    [provider]  XTAMPZA ER 13.5 MG C12A TAKE 1 CAPSULE BY MOUTH TWICE DAILY Patient not taking: Reported on 09/11/2022 08/16/22   Briant Cedar, PA-C      Allergies    Codeine and Lisinopril    Review of Systems   Review  of Systems  All other systems reviewed and are negative.   Physical Exam Updated Vital Signs BP 135/83   Pulse 79   Temp 98.6 F (37 C) (Oral)   Resp 17   Ht 5\' 11"  (1.803 m)   Wt 94.3 kg   SpO2 100%   BMI 29.01 kg/m  Physical Exam Vitals and nursing note reviewed.   62 year old male, resting comfortably and in no acute distress. Vital signs are normal. Oxygen saturation is 100%, which is normal. Head is normocephalic and atraumatic. PERRLA, EOMI. Oropharynx is clear.  Rashes present over the malar areas and the nose with hyperpigmentation of the skin and some crusting over the nose and malar areas. Neck is  nontender and supple without adenopathy. Lungs are clear without rales, wheezes, or rhonchi. Chest is nontender. Heart has regular rate and rhythm without murmur. Abdomen is soft, flat, nontender. Extremities have no cyanosis or edema, full range of motion is present. Skin is warm and dry without rash. Neurologic: Mental status is normal, cranial nerves are intact, moves all extremities equally.     ED Results / Procedures / Treatments    Procedures Procedures    Medications Ordered in ED Medications  HYDROcodone-acetaminophen (NORCO/VICODIN) 5-325 MG per tablet 1 tablet (has no administration in time range)  dexamethasone (DECADRON) tablet 10 mg (has no administration in time range)    ED Course/ Medical Decision Making/ A&P                                 Medical Decision Making Risk Prescription drug management.   Facial rash from panitumumab.  This is grade 2B.  No indication of worsening infection.  I have encouraged him to continue using topical clindamycin and oral doxycycline and add topical hydrocortisone cream.  I am giving him a prescription for oxycodone tablets to use for breakthrough pain.  I also ordered a single dose of dexamethasone.  He will need to follow-up with his oncologist, consider referral to dermatology if rash does not subside or if it gets worse.  Final Clinical Impression(s) / ED Diagnoses Final diagnoses:  Acneiform rash    Rx / DC Orders ED Discharge Orders          Ordered    oxyCODONE (ROXICODONE) 5 MG immediate release tablet  Every 6 hours PRN        09/15/22 0113              Dione Booze, MD 09/15/22 0128

## 2022-09-15 NOTE — ED Notes (Signed)
Per hematology notes, patient had a reaction to Panitumumab and has a acneiform rash.

## 2022-09-16 ENCOUNTER — Other Ambulatory Visit: Payer: Self-pay | Admitting: *Deleted

## 2022-09-16 ENCOUNTER — Telehealth: Payer: Self-pay | Admitting: *Deleted

## 2022-09-16 MED ORDER — PANTOPRAZOLE SODIUM 20 MG PO TBEC: 20.0000 mg | DELAYED_RELEASE_TABLET | Freq: Every day | ORAL | 0 refills | Status: DC

## 2022-09-16 MED ORDER — PREDNISONE 20 MG PO TABS: 20.0000 mg | ORAL_TABLET | Freq: Every day | ORAL | 0 refills | Status: DC

## 2022-09-16 NOTE — Telephone Encounter (Signed)
Received vm  from pt. He states he was sleeping when I called earlier. He asked for a call back.  Called pt. No answer. Left another message for pt to call back if he needed anything.

## 2022-09-16 NOTE — Telephone Encounter (Signed)
Received message from on call staff from 09/14/22. Pt had called due to worsening of rash on his face (chemo reaction). Report states that it is difficult for him to talk or eat due to the tightness of his skin and the pain. TCT patient and his son, Rodrickus, Ste[place]. No answer to either call but was able to leave vm message offering a [facility] appt to assess his facial rash. Awaiting call back.

## 2022-09-16 NOTE — Telephone Encounter (Signed)
Spoke with Dr. Leonides Schanz regarding pt's concern with skin issues on his face. Dr. Leonides Schanz ordered a prednisone taper along with pantoprazole daily. TCT patient and explained Dr. Derek Mound recommendations. Instructed pt on the dosage of prednisone over the next 3 weeks and reason for pantoprazole. Instructed that he should eat when he takes this medication.  Pt voiced understanding.

## 2022-09-20 ENCOUNTER — Telehealth: Payer: Self-pay

## 2022-09-20 NOTE — Telephone Encounter (Signed)
Transition Care Management Unsuccessful Follow-up Telephone Call  Date of discharge and from where:  09/15/2022 [facility]  Attempts:  1st Attempt  Reason for unsuccessful TCM follow-up call:  Unable to leave message   [facility]  [facility] Population Health Community Resource Care Guide   [email or web address]  ?? [phone]   Website: [email or web address]

## 2022-09-23 ENCOUNTER — Telehealth: Payer: Self-pay

## 2022-09-23 NOTE — Telephone Encounter (Signed)
Transition Care Management Unsuccessful Follow-up Telephone Call  Date of discharge and from where:  09/15/2022 [facility]  Attempts:  2nd Attempt  Reason for unsuccessful TCM follow-up call:  Unable to leave message   [facility]  [facility] Population Health Community Resource Care Guide   [email or web address]  ?? [phone]   Website: [email or web address]

## 2022-09-24 MED FILL — Dexamethasone Sodium Phosphate Inj 100 MG/10ML: INTRAMUSCULAR | Qty: 1 | Status: AC

## 2022-09-25 ENCOUNTER — Other Ambulatory Visit: Payer: Medicare HMO

## 2022-09-25 ENCOUNTER — Ambulatory Visit: Payer: Medicare HMO

## 2022-09-25 ENCOUNTER — Telehealth: Payer: Self-pay | Admitting: *Deleted

## 2022-09-25 ENCOUNTER — Ambulatory Visit: Payer: Medicare HMO | Admitting: Hematology and Oncology

## 2022-09-25 ENCOUNTER — Inpatient Hospital Stay: Payer: Medicare HMO | Admitting: Nurse Practitioner

## 2022-09-25 ENCOUNTER — Inpatient Hospital Stay: Payer: Medicare HMO

## 2022-09-25 ENCOUNTER — Encounter: Payer: Self-pay | Admitting: Hematology and Oncology

## 2022-09-25 DIAGNOSIS — L708 Other acne: Secondary | ICD-10-CM | POA: Insufficient documentation

## 2022-09-25 NOTE — Assessment & Plan Note (Deleted)
09/25/2022 - presents for Cycle 4 day 1 FOLFIRI.  -On 09/11/2022, panitumumab was held due to acneiform rash on face and in groin. -subsequently, he was seen in ED 09/15/2022 due to this rash. He was given vicodin and decadron in ED.  -review labs today

## 2022-09-25 NOTE — Telephone Encounter (Signed)
TCT patient as he did not show up for his appts today. Spoke to him. He said he had a URI. No fever, just a runny nose and a cough. He has not done a Covid test. Recommended he  do a home test. He voiced understanding. He would like to be rescheduled to next week. Advised that I would send a scheduling message for that. Right now his next appts are on 10/09/22.  Scheduling message sent.

## 2022-09-25 NOTE — Assessment & Plan Note (Deleted)
09/11/2022 - apnitumumab held due to presence of painful acneiform rash on face and in the groin.  -taking oral doxycycline and using topical clindamycin gel.  -subsequently seen in ED on 09/15/2022 as rash was worsening. Was given vicodin and dexamethasone.  09/25/2022 -

## 2022-09-25 NOTE — Progress Notes (Deleted)
Patient Care Team: [name], MD as PCP - General (Hematology and Oncology) Doreatha Massed, MD as Medical Oncologist (Medical Oncology)  Clinic Day:  09/25/2022  Referring physician: [name], MD  ASSESSMENT & PLAN:   Assessment & Plan: Rectal carcinoma (HCC) 09/25/2022 - presents for Cycle 4 day 1 FOLFIRI.  -On 09/11/2022, panitumumab was held due to acneiform rash on face and in groin. -subsequently, he was seen in ED 09/15/2022 due to this rash. He was given vicodin and decadron in ED.  -review labs today    Acneiform rash 09/11/2022 - apnitumumab held due to presence of painful acneiform rash on face and in the groin.  -taking oral doxycycline and using topical clindamycin gel.  -subsequently seen in ED on 09/15/2022 as rash was worsening. Was given vicodin and dexamethasone.  09/25/2022 -     The patient understands the plans discussed today and is in agreement with them.  He knows to contact our office if he develops concerns prior to his next appointment.  I provided *** minutes of face-to-face time during this encounter and > 50% was spent counseling as documented under my assessment and plan.    Carlean Jews, NP  [place] [facility] AT [facility] [address] [place] [address] Dept: [phone] Dept Fax: [phone]   No orders of the defined types were placed in this encounter.     CHIEF COMPLAINT:  CC: metastatic rectal adenocarcinoma   Current Treatment:  FOLFIRI with panitumumab every 14 days.   INTERVAL HISTORY:  Joseph Hogan is here today for repeat clinical assessment. He denies fevers or chills. He denies pain. His appetite is good. His weight {Weight change:10426}.  Metastatic Rectal Adenocarcinoma 02/03/2022-02/11/2022: Presented to ED due to left hip pain after a mechanical fall.  02/04/2022: CT left AYT:KZSWF pathologic fracture of the left femoral neck with displacement and  angulation, with underlying lytic lesions in the femoral head and neck.Additional lytic destructive lesion of the left parasymphyseal pubic bone extending throughout the superior pubic ramus to the puboacetabular junction, with pathologic fracture of the superior pubic ramus. Adjacent hypoattenuation in the pelvic component of the obturator internus muscle, suspicious for tumor involvement. Probable small lytic lesion in the inferior pubic ramus.Prominent left external iliac lymph nodes measuring up to 1.2 cm. 02/05/2022: CT CAP: Pathologic retroperitoneal and bilateral pelvic adenopathy, with lytic destructive lesions of the left superior and inferior pubic ramus and pubic body as well as a pathologic fracture through the left femoral neck. Pathologic fracture anteriorly in the right second rib. Deformity anteriorly in the right third rib probably from early pathologic fracture. Metastatic disease or myeloma strongly favored over multifocal osteomyelitis as a cause. Mildly enlarged left supraclavicular lymph node and lower thoracic periaortic lymph node. Pathologic retroperitoneal and pelvic adenopathy. 10 cm long segment of rectal wall thickening.1.1 cm exophytic lesion from the left mid kidney posterolaterally, nonspecific for complex cyst versus tumor. Bandlike atelectasis in both lungs.Small type 1 hiatal hernia.Lumbar spondylosis and degenerative disc disease causing generally mild multilevel impingement. Sigmoid colon diverticulosis.Wall thickening in the proximal stomach body, probably secondary to nondistention. 02/06/2022: Underwent left total hip arthroplasty. Pathology revealed poorly differentiated carcinoma.  SPEP/IFE did not detect monoclonal protein. PSA normal. 02/25/2022: Underwent colonoscopy that showed malignant tumor in the rectum. Pathology confirmed poorly differentiated carcinoma.  04/05/2022: Underwent port placement 04/08/2022: Cycle 1, Day 1 of FOLFOX (held bevacizumab due to recent  port placement) 04/23/2022: Cycle 2,  Day 1 of FOLFOX plus bevacizumab 05/07/2022: Cycle 3, Day 1 of FOLFOX plus bevacizumab 05/21/2022: Cycle 4, Day 1 of FOLFOX plus bevacizumab. Transitioned care to Dr. Caren Hazy at [facility] 07/02/2022: Cycle 6, Day 1 of FOLFOX plus bevacizumab at [facility].  07/11/2022: NM PET CT scan showed progression of hypermetabolic osseous metastatic disease, although there is some mixed change as detailed. New hypermetabolic right femoral neck metastasis places the patient at risk for pathologic right femoral neck fracture. 07/30/2022:  Cycle 1 Day 1  FOLFIRI +Panitumumab  08/14/2022: Cycle 2 Day 1 FOLFIRI + Panitumumab 08/27/2022: Cycle 3 Day 1 FOLFIRI + Panitumumab 09/11/2022: Cycle 4 Day 1 FOLFIRI (Hold Panitumumab due to acneiform rash) 09/25/2022 - cycle  5 Day 1 FOLFIRI     I have reviewed the past medical history, past surgical history, social history and family history with the patient and they are unchanged from previous note.  ALLERGIES:  is allergic to codeine and lisinopril.  MEDICATIONS:  Current Outpatient Medications  Medication Sig Dispense Refill   b complex vitamins capsule Take 1 capsule by mouth daily. (Patient not taking: Reported on 09/11/2022)     clindamycin (CLEOCIN-T) 1 % lotion Apply as needed for rash secondary to chemotherapy. (Patient not taking: Reported on 09/11/2022) 60 mL 2   Clotrimazole 1 % LOTN Apply to groin rash twice per day 30 mL 1   doxycycline (VIBRA-TABS) 100 MG tablet Take 1 tablet (100 mg total) by mouth 2 (two) times daily. 180 tablet 1   gabapentin (NEURONTIN) 300 MG capsule Take 1 capsule (300 mg total) by mouth at bedtime. (Patient not taking: Reported on 07/31/2022) 30 capsule 1   hydrocortisone 2.5 % cream Apply topically 2 (two) times daily. (Patient not taking: Reported on 08/14/2022) 30 g 0   lidocaine-prilocaine (EMLA) cream Apply a quarter-sized amount to port a cath site and cover with plastic wrap one hour prior to  infusion appointments (Patient not taking: Reported on 09/11/2022) 30 g 3   magnesium oxide (MAG-OX) 400 (240 Mg) MG tablet Take 400 mg by mouth daily. (Patient not taking: Reported on 09/11/2022)     Multiple Vitamin (MULTIVITAMIN WITH MINERALS) TABS tablet Take 1 tablet by mouth daily. (Patient not taking: Reported on 09/11/2022)     ondansetron (ZOFRAN) 8 MG tablet Take 2 tablets (16 mg total) by mouth every 8 (eight) hours as needed for nausea or vomiting. (Patient not taking: Reported on 08/14/2022) 20 tablet 3   oxyCODONE (ROXICODONE) 5 MG immediate release tablet Take 1 tablet (5 mg total) by mouth every 6 (six) hours as needed for severe pain. 15 tablet 0   pantoprazole (PROTONIX) 20 MG tablet Take 1 tablet (20 mg total) by mouth daily. 30 tablet 0   predniSONE (DELTASONE) 20 MG tablet Take 1 tablet (20 mg total) by mouth daily with breakfast. Take 60 mg (3 tablets) daily for 7 days; then 40 mg (2 tablets) for next 7 days; then 20 mg (1 tablet) for last 7 days. 42 tablet 0   prochlorperazine (COMPAZINE) 10 MG tablet Take 1 tablet (10 mg total) by mouth every 6 (six) hours as needed for nausea or vomiting. (Patient not taking: Reported on 08/14/2022) 30 tablet 3   tiZANidine (ZANAFLEX) 4 MG tablet Take 1 tablet (4 mg total) by mouth 3 (three) times daily. (Patient not taking: Reported on 08/14/2022) 20 tablet 0   VITAMIN A PO Take 1 tablet by mouth daily. (Patient not taking: Reported on 08/14/2022)  XTAMPZA ER 13.5 MG C12A TAKE 1 CAPSULE BY MOUTH TWICE DAILY (Patient not taking: Reported on 09/11/2022) 60 capsule 0   No current facility-administered medications for this visit.    HISTORY OF PRESENT ILLNESS:   Oncology History  Rectal carcinoma (HCC)  03/19/2022 Initial Diagnosis   Rectal carcinoma (HCC)   03/19/2022 Cancer Staging   Staging form: Colon and Rectum, AJCC 8th Edition - Clinical stage from 03/19/2022: Stage IVB (cTX, cN2b, cM1b) - Signed by [name], MD on 05/21/2022 Stage  prefix: Initial diagnosis   04/08/2022 - 07/04/2022 Chemotherapy   Patient is on Treatment Plan : COLORECTAL FOLFOX + Bevacizumab q14d     07/31/2022 -  Chemotherapy   Patient is on Treatment Plan : COLORECTAL FOLFIRI + Panitumumab q14d         REVIEW OF SYSTEMS:   Constitutional: Denies fevers, chills or abnormal weight loss Eyes: Denies blurriness of vision Ears, nose, mouth, throat, and face: Denies mucositis or sore throat Respiratory: Denies cough, dyspnea or wheezes Cardiovascular: Denies palpitation, chest discomfort or lower extremity swelling Gastrointestinal:  Denies nausea, heartburn or change in bowel habits Skin: Denies abnormal skin rashes Lymphatics: Denies new lymphadenopathy or easy bruising Neurological:Denies numbness, tingling or new weaknesses Behavioral/Psych: Mood is stable, no new changes  All other systems were reviewed with the patient and are negative.   VITALS:  There were no vitals taken for this visit.  Wt Readings from Last 3 Encounters:  09/15/22 208 lb (94.3 kg)  09/11/22 206 lb 4.8 oz (93.6 kg)  08/27/22 205 lb 12.8 oz (93.4 kg)    There is no height or weight on file to calculate BMI.  Performance status (ECOG): {CHL ONC [id number]  PHYSICAL EXAM:   GENERAL:alert, no distress and comfortable SKIN: skin color, texture, turgor are normal, no rashes or significant lesions EYES: normal, Conjunctiva are pink and non-injected, sclera clear OROPHARYNX:no exudate, no erythema and lips, buccal mucosa, and tongue normal  NECK: supple, thyroid normal size, non-tender, without nodularity LYMPH:  no palpable lymphadenopathy in the cervical, axillary or inguinal LUNGS: clear to auscultation and percussion with normal breathing effort HEART: regular rate & rhythm and no murmurs and no lower extremity edema ABDOMEN:abdomen soft, non-tender and normal bowel sounds Musculoskeletal:no cyanosis of digits and no clubbing  NEURO: alert & oriented x 3  with fluent speech, no focal motor/sensory deficits  LABORATORY DATA:  I have reviewed the data as listed    Component Value Date/Time   NA 137 09/11/2022 1036   K 4.3 09/11/2022 1036   CL 104 09/11/2022 1036   CO2 27 09/11/2022 1036   GLUCOSE 103 (H) 09/11/2022 1036   BUN 18 09/11/2022 1036   CREATININE 0.51 (L) 09/11/2022 1036   CREATININE 0.77 06/04/2016 1259   CALCIUM 9.2 09/11/2022 1036   PROT 6.7 09/11/2022 1036   ALBUMIN 3.9 09/11/2022 1036   AST 12 (L) 09/11/2022 1036   ALT 10 09/11/2022 1036   ALKPHOS 97 09/11/2022 1036   BILITOT 0.5 09/11/2022 1036   GFRNONAA >60 09/11/2022 1036   GFRNONAA >89 04/08/2015 0837   GFRAA >60 10/13/2019 1333   GFRAA >89 04/08/2015 0837    Lab Results  Component Value Date   WBC 3.6 (L) 09/11/2022   NEUTROABS 2.3 09/11/2022   HGB 11.0 (L) 09/11/2022   HCT 32.7 (L) 09/11/2022   MCV 90.3 09/11/2022   PLT 214 09/11/2022      RADIOGRAPHIC STUDIES: I have personally reviewed the radiological images as  listed and agreed with the findings in the report. No results found.

## 2022-09-27 ENCOUNTER — Inpatient Hospital Stay: Payer: Medicare HMO

## 2022-10-01 MED FILL — Dexamethasone Sodium Phosphate Inj 100 MG/10ML: INTRAMUSCULAR | Qty: 1 | Status: AC

## 2022-10-02 ENCOUNTER — Inpatient Hospital Stay: Payer: Medicare HMO

## 2022-10-02 ENCOUNTER — Inpatient Hospital Stay: Payer: Medicare HMO | Admitting: Hematology and Oncology

## 2022-10-02 ENCOUNTER — Encounter: Payer: Self-pay | Admitting: Hematology and Oncology

## 2022-10-02 VITALS — BP 153/97 | HR 71 | Temp 98.5°F | Resp 14 | Wt 212.4 lb

## 2022-10-02 DIAGNOSIS — Z95828 Presence of other vascular implants and grafts: Secondary | ICD-10-CM

## 2022-10-02 DIAGNOSIS — C20 Malignant neoplasm of rectum: Secondary | ICD-10-CM

## 2022-10-02 DIAGNOSIS — D509 Iron deficiency anemia, unspecified: Secondary | ICD-10-CM | POA: Diagnosis not present

## 2022-10-02 DIAGNOSIS — G893 Neoplasm related pain (acute) (chronic): Secondary | ICD-10-CM | POA: Diagnosis not present

## 2022-10-02 DIAGNOSIS — C7951 Secondary malignant neoplasm of bone: Secondary | ICD-10-CM | POA: Diagnosis not present

## 2022-10-02 DIAGNOSIS — R21 Rash and other nonspecific skin eruption: Secondary | ICD-10-CM | POA: Diagnosis not present

## 2022-10-02 DIAGNOSIS — Z5111 Encounter for antineoplastic chemotherapy: Secondary | ICD-10-CM

## 2022-10-02 DIAGNOSIS — C779 Secondary and unspecified malignant neoplasm of lymph node, unspecified: Secondary | ICD-10-CM | POA: Diagnosis not present

## 2022-10-02 LAB — CMP (CANCER CENTER ONLY)
ALT: 15 U/L (ref 0–44)
AST: 27 U/L (ref 15–41)
Albumin: 3.7 g/dL (ref 3.5–5.0)
Alkaline Phosphatase: 87 U/L (ref 38–126)
Anion gap: 6 (ref 5–15)
BUN: 22 mg/dL (ref 8–23)
CO2: 29 mmol/L (ref 22–32)
Calcium: 9.2 mg/dL (ref 8.9–10.3)
Chloride: 104 mmol/L (ref 98–111)
Creatinine: 0.46 mg/dL — ABNORMAL LOW (ref 0.61–1.24)
GFR, Estimated: >60 mL/min (ref >60)
Glucose, Bld: 124 mg/dL — ABNORMAL HIGH (ref 70–99)
Potassium: 4 mmol/L (ref 3.5–5.1)
Sodium: 139 mmol/L (ref 135–145)
Total Bilirubin: 0.6 mg/dL (ref 0.3–1.2)
Total Protein: 6.9 g/dL (ref 6.5–8.1)

## 2022-10-02 LAB — MAGNESIUM: Magnesium: 2 mg/dL (ref 1.7–2.4)

## 2022-10-02 LAB — CBC WITH DIFFERENTIAL (CANCER CENTER ONLY)
Abs Immature Granulocytes: 0.13 10*3/uL — ABNORMAL HIGH (ref 0.00–0.07)
Basophils Absolute: 0 10*3/uL (ref 0.0–0.1)
Basophils Relative: 0 %
Eosinophils Absolute: 0.2 10*3/uL (ref 0.0–0.5)
Eosinophils Relative: 3 %
HCT: 34.6 % — ABNORMAL LOW (ref 39.0–52.0)
Hemoglobin: 11.2 g/dL — ABNORMAL LOW (ref 13.0–17.0)
Immature Granulocytes: 2 %
Lymphocytes Relative: 10 %
Lymphs Abs: 0.7 10*3/uL (ref 0.7–4.0)
MCH: 29.5 pg (ref 26.0–34.0)
MCHC: 32.4 g/dL (ref 30.0–36.0)
MCV: 91.1 fL (ref 80.0–100.0)
Monocytes Absolute: 0.6 10*3/uL (ref 0.1–1.0)
Monocytes Relative: 10 %
Neutro Abs: 4.8 10*3/uL (ref 1.7–7.7)
Neutrophils Relative %: 75 %
Platelet Count: 232 10*3/uL (ref 150–400)
RBC: 3.8 MIL/uL — ABNORMAL LOW (ref 4.22–5.81)
RDW: 17.3 % — ABNORMAL HIGH (ref 11.5–15.5)
WBC Count: 6.4 10*3/uL (ref 4.0–10.5)
nRBC: 0 % (ref 0.0–0.2)

## 2022-10-02 MED ORDER — SODIUM CHLORIDE 0.9 % IV SOLN
10.0000 mg | Freq: Once | INTRAVENOUS | Status: AC
  Administered 2022-10-02: 10 mg via INTRAVENOUS
  Filled 2022-10-02: qty 1
  Filled 2022-10-02: qty 10

## 2022-10-02 MED ORDER — PANITUMUMAB CHEMO INJECTION 100 MG/5ML
4.0000 mg/kg | Freq: Once | INTRAVENOUS | Status: DC
Start: 2022-10-02 — End: 2022-10-02

## 2022-10-02 MED ORDER — SODIUM CHLORIDE 0.9% FLUSH
10.0000 mL | Freq: Once | INTRAVENOUS | Status: AC
  Administered 2022-10-02: 10 mL

## 2022-10-02 MED ORDER — PALONOSETRON HCL INJECTION 0.25 MG/5ML
0.2500 mg | Freq: Once | INTRAVENOUS | Status: AC
  Administered 2022-10-02: 0.25 mg via INTRAVENOUS
  Filled 2022-10-02: qty 5

## 2022-10-02 MED ORDER — SODIUM CHLORIDE 0.9 % IV SOLN
2400.0000 mg/m2 | INTRAVENOUS | Status: DC
  Administered 2022-10-02: 5000 mg via INTRAVENOUS
  Filled 2022-10-02: qty 100

## 2022-10-02 MED ORDER — SODIUM CHLORIDE 0.9 % IV SOLN
400.0000 mg/m2 | Freq: Once | INTRAVENOUS | Status: AC
  Administered 2022-10-02: 848 mg via INTRAVENOUS
  Filled 2022-10-02: qty 42.4

## 2022-10-02 MED ORDER — SODIUM CHLORIDE 0.9 % IV SOLN
180.0000 mg/m2 | Freq: Once | INTRAVENOUS | Status: AC
  Administered 2022-10-02: 400 mg via INTRAVENOUS
  Filled 2022-10-02: qty 15

## 2022-10-02 MED ORDER — FLUOROURACIL CHEMO INJECTION 2.5 GM/50ML
400.0000 mg/m2 | Freq: Once | INTRAVENOUS | Status: AC
  Administered 2022-10-02: 850 mg via INTRAVENOUS
  Filled 2022-10-02: qty 17

## 2022-10-02 MED ORDER — OXYCODONE HCL 5 MG PO TABS: 5.0000 mg | ORAL_TABLET | ORAL | 0 refills | Status: DC | PRN

## 2022-10-02 MED ORDER — PREDNISONE 5 MG PO TABS: 10.0000 mg | ORAL_TABLET | Freq: Every day | ORAL | 0 refills | Status: DC

## 2022-10-02 MED ORDER — SODIUM CHLORIDE 0.9 % IV SOLN: Freq: Once | INTRAVENOUS | Status: AC

## 2022-10-02 NOTE — Progress Notes (Signed)
[facility] [phone]   [phone]  PROGRESS NOTE  Patient Care Team: [name], MD as PCP - General (Hematology and Oncology) Doreatha Massed, MD as Medical Oncologist (Medical Oncology)  DIAGNOSIS: Metastatic rectal carcinoma  ONCOLOGIC HISTORY: # Metastatic Rectal Adenocarcinoma 02/03/2022-02/11/2022: Presented to ED due to left hip pain after a mechanical fall.  02/04/2022: CT left AVW:[id number] pathologic fracture of the left femoral neck with displacement and angulation, with underlying lytic lesions in the femoral head and neck.Additional lytic destructive lesion of the left parasymphyseal pubic bone extending throughout the superior pubic ramus to the puboacetabular junction, with pathologic fracture of the superior pubic ramus. Adjacent hypoattenuation in the pelvic component of the obturator internus muscle, suspicious for tumor involvement. Probable small lytic lesion in the inferior pubic ramus.Prominent left external iliac lymph nodes measuring up to 1.2 cm. 02/05/2022: CT CAP: Pathologic retroperitoneal and bilateral pelvic adenopathy, with lytic destructive lesions of the left superior and inferior pubic ramus and pubic body as well as a pathologic fracture through the left femoral neck. Pathologic fracture anteriorly in the right second rib. Deformity anteriorly in the right third rib probably from early pathologic fracture. Metastatic disease or myeloma strongly favored over multifocal osteomyelitis as a cause. Mildly enlarged left supraclavicular lymph node and lower thoracic periaortic lymph node. Pathologic retroperitoneal and pelvic adenopathy. 10 cm long segment of rectal wall thickening.1.1 cm exophytic lesion from the left mid kidney posterolaterally, nonspecific for complex cyst versus tumor. Bandlike atelectasis in both lungs.Small type 1 hiatal hernia.Lumbar spondylosis and degenerative disc disease causing generally mild  multilevel impingement. Sigmoid colon diverticulosis.Wall thickening in the proximal stomach body, probably secondary to nondistention. 02/06/2022: Underwent left total hip arthroplasty. Pathology revealed poorly differentiated carcinoma.  SPEP/IFE did not detect monoclonal protein. PSA normal. 02/25/2022: Underwent colonoscopy that showed malignant tumor in the rectum. Pathology confirmed poorly differentiated carcinoma.  04/05/2022: Underwent port placement 04/08/2022: Cycle 1, Day 1 of FOLFOX (held bevacizumab due to recent port placement) 04/23/2022: Cycle 2, Day 1 of FOLFOX plus bevacizumab 05/07/2022: Cycle 3, Day 1 of FOLFOX plus bevacizumab 05/21/2022: Cycle 4, Day 1 of FOLFOX plus bevacizumab. Transitioned care to Dr. Caren Hazy at [facility] 07/02/2022: Cycle 6, Day 1 of FOLFOX plus bevacizumab at [facility].  07/11/2022: NM PET CT scan showed progression of hypermetabolic osseous metastatic disease, although there is some mixed change as detailed. New hypermetabolic right femoral neck metastasis places the patient at risk for pathologic right femoral neck fracture. 07/30/2022:  Cycle 1 Day 1  FOLFIRI +Panitumumab  08/14/2022: Cycle 2 Day 1 FOLFIRI + Panitumumab 08/27/2022: Cycle 3 Day 1 FOLFIRI + Panitumumab 09/11/2022: Cycle 4 Day 1 FOLFIRI (Hold Panitumumab due to acneiform rash) 10/02/2022: Cycle 5 Day 1 FOLFIRI (Hold Panitumumab due to acneiform rash)  HISTORY OF PRESENTING ILLNESS:  Joseph Hogan 62 y.o. male returns for follow-up for metastatic rectal cancer. He presents today to start Cycle 5, Day 1 FOLFIRI +Panitumumab. He is unaccompanied for this visit.   On exam today, [name] reports he developed an awful rash in the interim since I last spoke with him.  He notes that he tried aloe vera which did provide some modest relief.  He also had to take his oxycodone because of pain he was having around his mouth.  He reports it is no longer painful and he feels much better.  He is currently  taking prednisone 20 mg p.o. daily and we discussed dropping down to a 10 mg  dose for 1 to 2 weeks.  He notes his appetite has been good and he has felt a boost in his energy recently.  He notes he has not had any issues with nausea, vomiting, or diarrhea he is not having any other side effects as a result of his chemotherapy.  He is still having some aching in his legs and is relieved that his rash has near completely resolved.  He denies any fevers, chills, sweats, shortness of breath, chest pain or cough..  A full 10 point ROS is otherwise negative.  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   with abnormal R wave progression noted on EKG   MRSA (methicillin resistant Staphylococcus aureus)    Pre-diabetes    Rectal carcinoma (HCC) 03/19/2022    SURGICAL HISTORY: Past Surgical History:  Procedure Laterality Date   COLONOSCOPY     HUMERUS IM NAIL Right 04/12/2022   Procedure: INTRAMEDULLARY (IM) NAIL HUMERAL;  Surgeon: Bjorn Pippin, MD;  Location: [facility];  Service: Orthopedics;  Laterality: Right;   IR IMAGING GUIDED PORT INSERTION  04/05/2022   TOTAL HIP ARTHROPLASTY Left 02/06/2022   Procedure: TOTAL HIP ARTHROPLASTY;  Surgeon: Joen Laura, MD;  Location: [facility];  Service: Orthopedics;  Laterality: Left;    SOCIAL HISTORY: Social History   Socioeconomic History   Marital status: Divorced    Spouse name: Not on file   Number of children: Not on file   Years of education: Not on file   Highest education level: Not on file  Occupational History   Not on file  Tobacco Use   Smoking status: Never   Smokeless tobacco: Never  Vaping Use   Vaping status: Never Used  Substance and Sexual Activity   Alcohol use: No   Drug use: No   Sexual activity: Not on file  Other Topics Concern   Not on file  Social History Narrative   Not on file   Social  Determinants of Health   Financial Resource Strain: Not on file  Food Insecurity: No Food Insecurity (06/10/2022)   Hunger Vital Sign    Worried About Running Out of Food in the Last Year: Never true    Ran Out of Food in the Last Year: Never true  Transportation Needs: No Transportation Needs (06/10/2022)   PRAPARE - Administrator, Civil Service (Medical): No    Lack of Transportation (Non-Medical): No  Physical Activity: Not on file  Stress: Not on file  Social Connections: Not on file  Intimate Partner Violence: Not At Risk (02/04/2022)   Humiliation, Afraid, Rape, and Kick questionnaire    Fear of Current or Ex-Partner: No    Emotionally Abused: No    Physically Abused: No    Sexually Abused: No    FAMILY HISTORY: Family History  Problem Relation Age of Onset   Stroke Mother    Hypertension Mother    Heart disease Father    Alzheimer's disease Father    Hypertension Brother    Alcohol abuse Brother    Cancer Maternal Aunt    Heart disease Maternal Aunt    Cancer Maternal Uncle    Heart disease Maternal Uncle    Cancer Paternal Aunt    Heart disease Paternal Aunt    Cancer Paternal Uncle    Heart disease Paternal Uncle  Heart disease Paternal Grandmother    Alzheimer's disease Paternal Grandfather    Stomach cancer Neg Hx    Rectal cancer Neg Hx    Esophageal cancer Neg Hx    Colon cancer Neg Hx     ALLERGIES:  is allergic to codeine and lisinopril.  MEDICATIONS:  Current Outpatient Medications  Medication Sig Dispense Refill   b complex vitamins capsule Take 1 capsule by mouth daily.     magnesium oxide (MAG-OX) 400 (240 Mg) MG tablet Take 400 mg by mouth daily.     Multiple Vitamin (MULTIVITAMIN WITH MINERALS) TABS tablet Take 1 tablet by mouth daily.     oxyCODONE (OXY IR/ROXICODONE) 5 MG immediate release tablet Take 1 tablet (5 mg total) by mouth every 4 (four) hours as needed for severe pain. 21 tablet 0   predniSONE (DELTASONE) 5 MG tablet  Take 2 tablets (10 mg total) by mouth daily with breakfast. 14 tablet 0   VITAMIN A PO Take 1 tablet by mouth daily.     XTAMPZA ER 13.5 MG C12A TAKE 1 CAPSULE BY MOUTH TWICE DAILY 60 capsule 0   Clotrimazole 1 % LOTN Apply to groin rash twice per day 30 mL 1   doxycycline (VIBRA-TABS) 100 MG tablet Take 1 tablet (100 mg total) by mouth 2 (two) times daily. 180 tablet 1   gabapentin (NEURONTIN) 300 MG capsule Take 1 capsule (300 mg total) by mouth at bedtime. (Patient not taking: Reported on 07/31/2022) 30 capsule 1   hydrocortisone 2.5 % cream Apply topically 2 (two) times daily. (Patient not taking: Reported on 08/14/2022) 30 g 0   lidocaine-prilocaine (EMLA) cream Apply a quarter-sized amount to port a cath site and cover with plastic wrap one hour prior to infusion appointments (Patient not taking: Reported on 09/11/2022) 30 g 3   ondansetron (ZOFRAN) 8 MG tablet Take 2 tablets (16 mg total) by mouth every 8 (eight) hours as needed for nausea or vomiting. (Patient not taking: Reported on 08/14/2022) 20 tablet 3   pantoprazole (PROTONIX) 20 MG tablet Take 1 tablet (20 mg total) by mouth daily. 30 tablet 0   prochlorperazine (COMPAZINE) 10 MG tablet Take 1 tablet (10 mg total) by mouth every 6 (six) hours as needed for nausea or vomiting. (Patient not taking: Reported on 08/14/2022) 30 tablet 3   tiZANidine (ZANAFLEX) 4 MG tablet Take 1 tablet (4 mg total) by mouth 3 (three) times daily. (Patient not taking: Reported on 08/14/2022) 20 tablet 0   No current facility-administered medications for this visit.    REVIEW OF SYSTEMS:   Constitutional: ( - ) fevers, ( - )  chills , ( - ) night sweats Eyes: ( - ) blurriness of vision, ( - ) double vision, ( - ) watery eyes Ears, nose, mouth, throat, and face: ( - ) mucositis, ( - ) sore throat Respiratory: ( - ) cough, ( - ) dyspnea, ( - ) wheezes Cardiovascular: ( - ) palpitation, ( - ) chest discomfort, ( - ) lower extremity swelling Gastrointestinal:  ( - )  nausea, ( - ) heartburn, ( - ) change in bowel habits Skin: ( + ) abnormal skin rashes Lymphatics: ( - ) new lymphadenopathy, ( - ) easy bruising Neurological: ( - ) numbness, ( - ) tingling, ( - ) new weaknesses Behavioral/Psych: ( - ) mood change, ( - ) new changes  All other systems were reviewed with the patient and are negative.  PHYSICAL EXAMINATION: ECOG PERFORMANCE STATUS:  1 - Symptomatic but completely ambulatory  Vitals:   10/02/22 1048  BP: (!) 153/97  Pulse: 71  Resp: 14  Temp: 98.5 F (36.9 C)  SpO2: 100%    Filed Weights   10/02/22 1048  Weight: 212 lb 6.4 oz (96.3 kg)     GENERAL: well appearing male in NAD. Exam performed in wheelchair.  SKIN: skin color, texture, turgor are normal,or significant lesions. Acneiform, erythematous rash on face, chest, scalp. Erythematous rash involving groin.  EYES: conjunctiva are pink and non-injected, sclera clear LUNGS: clear to auscultation and percussion with normal breathing effort HEART: regular rate & rhythm and no murmurs and no lower extremity edema Musculoskeletal: no cyanosis of digits and no clubbing  PSYCH: alert & oriented x 3, fluent speech NEURO: no focal motor/sensory deficits  LABORATORY DATA:  I have reviewed the data as listed    Latest Ref Rng & Units 10/11/2022    3:23 PM 10/02/2022    9:47 AM 09/11/2022   10:36 AM  CBC  WBC 4.0 - 10.5 K/uL 3.8  6.4  3.6   Hemoglobin 13.0 - 17.0 g/dL 91.4  78.2  95.6   Hematocrit 39.0 - 52.0 % 35.1  34.6  32.7   Platelets 150 - 400 K/uL 224  232  214        Latest Ref Rng & Units 10/11/2022    3:23 PM 10/02/2022    9:47 AM 09/11/2022   10:36 AM  CMP  Glucose 70 - 99 mg/dL 213  086  578   BUN 8 - 23 mg/dL 20  22  18    Creatinine 0.61 - 1.24 mg/dL 4.69  6.29  5.28   Sodium 135 - 145 mmol/L 139  139  137   Potassium 3.5 - 5.1 mmol/L 4.5  4.0  4.3   Chloride 98 - 111 mmol/L 102  104  104   CO2 22 - 32 mmol/L 25  29  27    Calcium 8.9 - 10.3 mg/dL 8.8  9.2  9.2    Total Protein 6.5 - 8.1 g/dL 6.4  6.9  6.7   Total Bilirubin 0.3 - 1.2 mg/dL 0.5  0.6  0.5   Alkaline Phos 38 - 126 U/L 114  87  97   AST 15 - 41 U/L 27  27  12    ALT 0 - 44 U/L 16  15  10       PATHOLOGY: Rectum, biopsy POORLY DIFFERENTIATED CARCINOMA. SEE NOTE. Diagnosis Note Addendum: Immunohistochemistry shows the carcinoma is positive with MOC-31, cytokeratin 20 and CDX2. Tumor is negative with cytokeratin 7, cytokeratin 5/6, p40, TTF-1, Napsin A, prostate-specific antigen and prostein. The immunophenotype is consistent with primary colorectal adenocarcinoma.  RADIOGRAPHIC STUDIES: US SCROTUM W/DOPPLER  Result Date: 10/11/2022 CLINICAL DATA:  Four day history of scrotal swelling EXAM: SCROTAL ULTRASOUND DOPPLER ULTRASOUND OF THE TESTICLES TECHNIQUE: Complete ultrasound examination of the testicles, epididymis, and other scrotal structures was performed. Color and spectral Doppler ultrasound were also utilized to evaluate blood flow to the testicles. COMPARISON:  None Available. FINDINGS: Right testicle Measurements: 4.0 x 2.8 x 2.4 cm, 14.5 mL. No mass or microlithiasis visualized. Left testicle Measurements: 3.6 x 2.5 x 2.4 cm, 11.1 mL. No mass or microlithiasis visualized. Right epididymis:  Small epididymal head cyst measures 4 x 3 x 3 mm. Left epididymis:  Normal in size and appearance. Hydrocele:  Small bilateral hydroceles. Varicocele:  None visualized. Pulsed Doppler interrogation of both testes demonstrates normal low resistance arterial and  venous waveforms bilaterally. Diffuse soft tissue edema of the overlying scrotum. IMPRESSION: 1. Diffuse soft tissue edema of the overlying scrotum. 2. Small bilateral hydroceles. 3. No evidence of testicular torsion. Electronically Signed   By: Agustin Cree M.D.   On: 10/11/2022 16:50   US Venous Img Lower Right (DVT Study)  Result Date: 10/11/2022 CLINICAL DATA:  Leg swelling and pain since Monday. History of rectal cancer. EXAM: Right LOWER  EXTREMITY VENOUS DOPPLER ULTRASOUND TECHNIQUE: Gray-scale sonography with compression, as well as color and duplex ultrasound, were performed to evaluate the deep venous system(s) from the level of the common femoral vein through the popliteal and proximal calf veins. COMPARISON:  Ultrasound 06/04/2022 FINDINGS: VENOUS Normal compressibility of the common femoral, superficial femoral, and popliteal veins, as well as the visualized calf veins. Visualized portions of profunda femoral vein and great saphenous vein unremarkable. No filling defects to suggest DVT on grayscale or color Doppler imaging. Doppler waveforms show normal direction of venous flow, normal respiratory plasticity and response to augmentation. Limited views of the contralateral common femoral vein are unremarkable. OTHER Few prominent inguinal nodes identified. These are enlarged up to 2.3 x 2.6 cm but this has a fatty hilum and normal morphology. These could be reactive. Please correlate clinical findings. There also soft tissue edema in the calf. Limitations: none IMPRESSION: No evidence of right lower extremity DVT. Calf edema with enlarged but normally morphologic inguinal lymph nodes. Please correlate with clinical findings Electronically Signed   By: Karen Kays M.D.   On: 10/11/2022 16:47    ASSESSMENT & PLAN FLAKE PERSINGER is a 62 y.o. male who presents to the clinic for a follow up for rectal carcinoma.   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  --We reviewed that his cancer is incurable and mainstay treatment is chemotherapy. --Recommend chemotherapy regimen FOLFOX plus Bevacizumab q 2 weeks, started on 04/08/2022.  --progression noted on last PET/CT scan from 07/11/2022. D/c FOLFOX + Bev and start FOLFIRI + Panitumumab on  07/31/2022. PLAN: --Due for Cycle 5, Day 1 of FOLFIRI + Panitumumab today --Labs from today reviewed and adequate for treatment. WBC 3.8, hemoglobin 11.3, MCV 94.1, and platelets of 224. Creatinine and LFTs adequate.  --Hold panitumumab today due to worsening acneiform rash. Proceed with FOLFIRI alone today.  --CT scan to be scheduled for late August/early Sept 2024 --RTC in 2 weeks before Cycle 6, Day 1 of FOLFIRI +/- Panitumumab   #Acneiform rash --near resolved --Grade 2-3 at worst --Secondary to panitumumab --Holding panitumbab indefinitely due to severity fo rash.  --Completed doxycycline 100 mg BID. Continue Cleocin gel for spot treatment.   #Groin rash-resolved.  --Could be secondary to panitumumab but also tinea cruris.  --Sent topic antifungal cream to apply twice daily  #Microcytic anemia: #Thrombocytosis: --patient received IV venofer 200 mg once a week x 3  #Right shoulder pain # Leg Pain  # Bone Pain from Metastatic Cancer  --Right shoulder xray form 04/08/2022 showed destructive lesion within the proximal to mid right humerus shaft with acute to subacture pathologic fracture. --Patient underwent pinning of right humerus on 04/12/2022 --Received palliative radiation from 05/14/2022-06/03/2022 to right humerus, L3 spine and pelvis --Current pain regimen includes Xtampza 13.5 q 12 hours, oxycodone 5 mg for breakthrough pain.    #Supportive Care -- chemotherapy education complete -- port placement complete.  -- zofran  8mg  q8H PRN and compazine 10mg  PO q6H for nausea -- EMLA cream for port  Orders Placed This Encounter  Procedures   CT CHEST ABDOMEN PELVIS W CONTRAST    Standing Status:   Future    Standing Expiration Date:   10/02/2023    Order Specific Question:   If indicated for the ordered procedure, I authorize the administration of contrast media per Radiology protocol    Answer:   Yes    Order Specific Question:   Does the patient have a contrast media/X-ray dye  allergy?    Answer:   No    Order Specific Question:   Preferred imaging location?    Answer:   [facility]    Order Specific Question:   If indicated for the ordered procedure, I authorize the administration of oral contrast media per Radiology protocol    Answer:   Yes   CT CHEST ABDOMEN PELVIS W CONTRAST    Standing Status:   Future    Standing Expiration Date:   10/12/2023    Order Specific Question:   If indicated for the ordered procedure, I authorize the administration of contrast media per Radiology protocol    Answer:   Yes    Order Specific Question:   Does the patient have a contrast media/X-ray dye allergy?    Answer:   No    Order Specific Question:   Preferred imaging location?    Answer:   [facility]    Order Specific Question:   If indicated for the ordered procedure, I authorize the administration of oral contrast media per Radiology protocol    Answer:   Yes    All questions were answered. The patient knows to call the clinic with any problems, questions or concerns.  I have spent a total of 30 minutes minutes of face-to-face and non-face-to-face time, preparing to see the patient, performing a medically appropriate examination, counseling and educating the patient, ordering medications/tests/procedures,  documenting clinical information in the electronic health record,  and care coordination.   Ulysees Barns, MD Department of Hematology/Oncology [facility] Phone: [phone] Pager: [phone] Email: [email or web address]

## 2022-10-03 ENCOUNTER — Other Ambulatory Visit: Payer: Self-pay | Admitting: Hematology and Oncology

## 2022-10-04 ENCOUNTER — Inpatient Hospital Stay: Payer: Medicare HMO

## 2022-10-04 VITALS — BP 132/78 | HR 72 | Temp 97.6°F | Resp 16

## 2022-10-04 DIAGNOSIS — C20 Malignant neoplasm of rectum: Secondary | ICD-10-CM | POA: Diagnosis not present

## 2022-10-04 DIAGNOSIS — Z95828 Presence of other vascular implants and grafts: Secondary | ICD-10-CM

## 2022-10-04 DIAGNOSIS — C7951 Secondary malignant neoplasm of bone: Secondary | ICD-10-CM | POA: Diagnosis not present

## 2022-10-04 DIAGNOSIS — D509 Iron deficiency anemia, unspecified: Secondary | ICD-10-CM | POA: Diagnosis not present

## 2022-10-04 DIAGNOSIS — C779 Secondary and unspecified malignant neoplasm of lymph node, unspecified: Secondary | ICD-10-CM | POA: Diagnosis not present

## 2022-10-04 DIAGNOSIS — Z5111 Encounter for antineoplastic chemotherapy: Secondary | ICD-10-CM | POA: Diagnosis not present

## 2022-10-04 DIAGNOSIS — G893 Neoplasm related pain (acute) (chronic): Secondary | ICD-10-CM | POA: Diagnosis not present

## 2022-10-04 DIAGNOSIS — R21 Rash and other nonspecific skin eruption: Secondary | ICD-10-CM | POA: Diagnosis not present

## 2022-10-04 MED ORDER — SODIUM CHLORIDE 0.9% FLUSH
10.0000 mL | Freq: Once | INTRAVENOUS | Status: AC
  Administered 2022-10-04: 10 mL

## 2022-10-04 MED ORDER — HEPARIN SOD (PORK) LOCK FLUSH 100 UNIT/ML IV SOLN
500.0000 [IU] | Freq: Once | INTRAVENOUS | Status: AC
  Administered 2022-10-04: 500 [IU]

## 2022-10-09 ENCOUNTER — Ambulatory Visit: Payer: Medicare HMO

## 2022-10-09 ENCOUNTER — Ambulatory Visit: Payer: Medicare HMO | Admitting: Physician Assistant

## 2022-10-09 ENCOUNTER — Other Ambulatory Visit: Payer: Medicare HMO

## 2022-10-11 ENCOUNTER — Emergency Department (HOSPITAL_COMMUNITY): Payer: Medicare HMO

## 2022-10-11 ENCOUNTER — Ambulatory Visit (HOSPITAL_COMMUNITY): Payer: Medicare HMO | Attending: Hematology and Oncology

## 2022-10-11 ENCOUNTER — Other Ambulatory Visit: Payer: Self-pay

## 2022-10-11 ENCOUNTER — Encounter (HOSPITAL_COMMUNITY): Payer: Self-pay

## 2022-10-11 ENCOUNTER — Emergency Department (HOSPITAL_COMMUNITY)
Admission: EM | Admit: 2022-10-11 | Discharge: 2022-10-11 | Disposition: A | Discharge status: Home / Self Care | Payer: Medicare HMO | Source: Home / Self Care

## 2022-10-11 DIAGNOSIS — D500 Iron deficiency anemia secondary to blood loss (chronic): Secondary | ICD-10-CM | POA: Diagnosis not present

## 2022-10-11 DIAGNOSIS — N5089 Other specified disorders of the male genital organs: Secondary | ICD-10-CM | POA: Insufficient documentation

## 2022-10-11 DIAGNOSIS — E876 Hypokalemia: Secondary | ICD-10-CM | POA: Diagnosis present

## 2022-10-11 DIAGNOSIS — Z8249 Family history of ischemic heart disease and other diseases of the circulatory system: Secondary | ICD-10-CM | POA: Diagnosis not present

## 2022-10-11 DIAGNOSIS — E119 Type 2 diabetes mellitus without complications: Secondary | ICD-10-CM | POA: Diagnosis not present

## 2022-10-11 DIAGNOSIS — R9431 Abnormal electrocardiogram [ECG] [EKG]: Secondary | ICD-10-CM | POA: Diagnosis not present

## 2022-10-11 DIAGNOSIS — E785 Hyperlipidemia, unspecified: Secondary | ICD-10-CM | POA: Diagnosis not present

## 2022-10-11 DIAGNOSIS — Z85048 Personal history of other malignant neoplasm of rectum, rectosigmoid junction, and anus: Secondary | ICD-10-CM | POA: Insufficient documentation

## 2022-10-11 DIAGNOSIS — R590 Localized enlarged lymph nodes: Secondary | ICD-10-CM | POA: Insufficient documentation

## 2022-10-11 DIAGNOSIS — Z888 Allergy status to other drugs, medicaments and biological substances status: Secondary | ICD-10-CM | POA: Diagnosis not present

## 2022-10-11 DIAGNOSIS — I444 Left anterior fascicular block: Secondary | ICD-10-CM | POA: Diagnosis present

## 2022-10-11 DIAGNOSIS — C19 Malignant neoplasm of rectosigmoid junction: Secondary | ICD-10-CM | POA: Diagnosis not present

## 2022-10-11 DIAGNOSIS — R591 Generalized enlarged lymph nodes: Secondary | ICD-10-CM

## 2022-10-11 DIAGNOSIS — N503 Cyst of epididymis: Secondary | ICD-10-CM | POA: Diagnosis not present

## 2022-10-11 DIAGNOSIS — C7951 Secondary malignant neoplasm of bone: Secondary | ICD-10-CM | POA: Diagnosis not present

## 2022-10-11 DIAGNOSIS — M79604 Pain in right leg: Secondary | ICD-10-CM | POA: Diagnosis present

## 2022-10-11 DIAGNOSIS — Z79899 Other long term (current) drug therapy: Secondary | ICD-10-CM | POA: Diagnosis not present

## 2022-10-11 DIAGNOSIS — E538 Deficiency of other specified B group vitamins: Secondary | ICD-10-CM | POA: Diagnosis not present

## 2022-10-11 DIAGNOSIS — M19012 Primary osteoarthritis, left shoulder: Secondary | ICD-10-CM | POA: Diagnosis not present

## 2022-10-11 DIAGNOSIS — K5903 Drug induced constipation: Secondary | ICD-10-CM | POA: Diagnosis present

## 2022-10-11 DIAGNOSIS — R600 Localized edema: Secondary | ICD-10-CM | POA: Diagnosis not present

## 2022-10-11 DIAGNOSIS — N433 Hydrocele, unspecified: Secondary | ICD-10-CM | POA: Diagnosis not present

## 2022-10-11 DIAGNOSIS — M47816 Spondylosis without myelopathy or radiculopathy, lumbar region: Secondary | ICD-10-CM | POA: Diagnosis not present

## 2022-10-11 DIAGNOSIS — G893 Neoplasm related pain (acute) (chronic): Secondary | ICD-10-CM | POA: Diagnosis not present

## 2022-10-11 DIAGNOSIS — Y92009 Unspecified place in unspecified non-institutional (private) residence as the place of occurrence of the external cause: Secondary | ICD-10-CM | POA: Diagnosis not present

## 2022-10-11 DIAGNOSIS — Z885 Allergy status to narcotic agent status: Secondary | ICD-10-CM | POA: Diagnosis not present

## 2022-10-11 DIAGNOSIS — R21 Rash and other nonspecific skin eruption: Secondary | ICD-10-CM | POA: Diagnosis present

## 2022-10-11 DIAGNOSIS — D630 Anemia in neoplastic disease: Secondary | ICD-10-CM | POA: Diagnosis not present

## 2022-10-11 DIAGNOSIS — M25512 Pain in left shoulder: Secondary | ICD-10-CM | POA: Diagnosis not present

## 2022-10-11 DIAGNOSIS — T402X5A Adverse effect of other opioids, initial encounter: Secondary | ICD-10-CM | POA: Diagnosis present

## 2022-10-11 DIAGNOSIS — C20 Malignant neoplasm of rectum: Secondary | ICD-10-CM | POA: Diagnosis not present

## 2022-10-11 DIAGNOSIS — E1165 Type 2 diabetes mellitus with hyperglycemia: Secondary | ICD-10-CM | POA: Diagnosis not present

## 2022-10-11 DIAGNOSIS — Z811 Family history of alcohol abuse and dependence: Secondary | ICD-10-CM | POA: Diagnosis not present

## 2022-10-11 DIAGNOSIS — M7989 Other specified soft tissue disorders: Secondary | ICD-10-CM | POA: Diagnosis not present

## 2022-10-11 DIAGNOSIS — I10 Essential (primary) hypertension: Secondary | ICD-10-CM | POA: Diagnosis not present

## 2022-10-11 DIAGNOSIS — Z96642 Presence of left artificial hip joint: Secondary | ICD-10-CM | POA: Diagnosis present

## 2022-10-11 LAB — CBC WITH DIFFERENTIAL/PLATELET
Abs Immature Granulocytes: 0.07 10*3/uL (ref 0.00–0.07)
Basophils Absolute: 0 10*3/uL (ref 0.0–0.1)
Basophils Relative: 1 %
Eosinophils Absolute: 0.2 10*3/uL (ref 0.0–0.5)
Eosinophils Relative: 6 %
HCT: 35.1 % — ABNORMAL LOW (ref 39.0–52.0)
Hemoglobin: 11.3 g/dL — ABNORMAL LOW (ref 13.0–17.0)
Immature Granulocytes: 2 %
Lymphocytes Relative: 11 %
Lymphs Abs: 0.4 10*3/uL — ABNORMAL LOW (ref 0.7–4.0)
MCH: 30.3 pg (ref 26.0–34.0)
MCHC: 32.2 g/dL (ref 30.0–36.0)
MCV: 94.1 fL (ref 80.0–100.0)
Monocytes Absolute: 0.3 10*3/uL (ref 0.1–1.0)
Monocytes Relative: 7 %
Neutro Abs: 2.8 10*3/uL (ref 1.7–7.7)
Neutrophils Relative %: 73 %
Platelets: 224 10*3/uL (ref 150–400)
RBC: 3.73 MIL/uL — ABNORMAL LOW (ref 4.22–5.81)
RDW: 16.7 % — ABNORMAL HIGH (ref 11.5–15.5)
WBC: 3.8 10*3/uL — ABNORMAL LOW (ref 4.0–10.5)
nRBC: 0 % (ref 0.0–0.2)

## 2022-10-11 LAB — COMPREHENSIVE METABOLIC PANEL
ALT: 16 U/L (ref 0–44)
AST: 27 U/L (ref 15–41)
Albumin: 3.5 g/dL (ref 3.5–5.0)
Alkaline Phosphatase: 114 U/L (ref 38–126)
Anion gap: 12 (ref 5–15)
BUN: 20 mg/dL (ref 8–23)
CO2: 25 mmol/L (ref 22–32)
Calcium: 8.8 mg/dL — ABNORMAL LOW (ref 8.9–10.3)
Chloride: 102 mmol/L (ref 98–111)
Creatinine, Ser: 0.74 mg/dL (ref 0.61–1.24)
GFR, Estimated: >60 mL/min (ref >60)
Glucose, Bld: 158 mg/dL — ABNORMAL HIGH (ref 70–99)
Potassium: 4.5 mmol/L (ref 3.5–5.1)
Sodium: 139 mmol/L (ref 135–145)
Total Bilirubin: 0.5 mg/dL (ref 0.3–1.2)
Total Protein: 6.4 g/dL — ABNORMAL LOW (ref 6.5–8.1)

## 2022-10-11 MED ORDER — HYDROMORPHONE HCL 1 MG/ML IJ SOLN
2.0000 mg | Freq: Once | INTRAMUSCULAR | Status: AC
  Administered 2022-10-11: 2 mg via INTRAMUSCULAR
  Filled 2022-10-11: qty 2

## 2022-10-11 NOTE — ED Provider Notes (Signed)
[place] EMERGENCY DEPARTMENT AT [facility] Provider Note   CSN: [id number] Arrival date & time: 10/11/22  1441     History  Chief Complaint  Patient presents with   Leg Pain    Joseph Hogan is a 62 y.o. male.  Patient complains of swelling in his right leg and his right scrotum.  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  Patient reports he recently was on prednisone.  He feels like the swelling in his leg gets worse when he is on prednisone.  Patient reports that prednisone makes him hyper and makes him eat more.  Patient denies any fever or chills he has not had any shortness of breath.  The history is provided by the patient.  Leg Pain Location:  Leg Leg location:  R leg Pain details:    Quality:  Aching   Radiates to:  Does not radiate   Severity:  Moderate   Onset quality:  Gradual   Timing:  Constant   Progression:  Worsening Relieved by:  Nothing Worsened by:  Nothing      Home Medications Prior to Admission medications   Medication Sig Start Date End Date Taking? Authorizing Provider  b complex vitamins capsule Take 1 capsule by mouth daily.    [provider]  Clotrimazole 1 % LOTN Apply to groin rash twice per day 09/11/22   Georga Kaufmann T, PA-C  doxycycline (VIBRA-TABS) 100 MG tablet Take 1 tablet (100 mg total) by mouth 2 (two) times daily. 07/31/22   [name], MD  gabapentin (NEURONTIN) 300 MG capsule Take 1 capsule (300 mg total) by mouth at bedtime. Patient not taking: Reported on 07/31/2022 06/06/22   Ulysees Barns IV, MD  hydrocortisone 2.5 % cream Apply topically 2 (two) times daily. Patient not taking: Reported on 08/14/2022 08/06/22   [name], MD  lidocaine-prilocaine (EMLA) cream Apply a quarter-sized amount to port a cath site and cover with plastic wrap one  hour prior to infusion appointments Patient not taking: Reported on 09/11/2022 08/27/22   [name], MD  magnesium oxide (MAG-OX) 400 (240 Mg) MG tablet Take 400 mg by mouth daily.    [provider]  Multiple Vitamin (MULTIVITAMIN WITH MINERALS) TABS tablet Take 1 tablet by mouth daily.    [provider]  ondansetron (ZOFRAN) 8 MG tablet Take 2 tablets (16 mg total) by mouth every 8 (eight) hours as needed for nausea or vomiting. Patient not taking: Reported on 08/14/2022 08/02/22   [name], MD  oxyCODONE (OXY IR/ROXICODONE) 5 MG immediate release tablet Take 1 tablet (5 mg total) by mouth every 4 (four) hours as needed for severe pain. 10/02/22   [name], MD  pantoprazole (PROTONIX) 20 MG tablet Take 1 tablet (20 mg total) by mouth daily. 09/16/22   [name], MD  predniSONE (DELTASONE) 5 MG tablet Take 2 tablets (10 mg total) by mouth daily with breakfast. 10/02/22   [name], MD  prochlorperazine (COMPAZINE) 10 MG tablet Take 1 tablet (10 mg total) by mouth every 6 (six) hours as needed for nausea or vomiting. Patient not taking: Reported on 08/14/2022 08/02/22   [name], MD  tiZANidine (ZANAFLEX) 4 MG tablet Take 1 tablet (4  mg total) by mouth 3 (three) times daily. Patient not taking: Reported on 08/14/2022 07/31/22   [name], MD  VITAMIN A PO Take 1 tablet by mouth daily.    [provider]  XTAMPZA ER 13.5 MG C12A TAKE 1 CAPSULE BY MOUTH TWICE DAILY 08/16/22   Georga Kaufmann T, PA-C      Allergies    Codeine and Lisinopril    Review of Systems   Review of Systems  All other systems reviewed and are negative.   Physical Exam Updated Vital Signs BP (!) 152/80 (BP Location: Right Arm)   Pulse 70   Temp 100.3 F (37.9 C) (Oral)   Resp 18   Ht 5\' 11"  (1.803 m)   Wt 96.2 kg   SpO2 96%   BMI 29.57 kg/m  Physical Exam Vitals and nursing note reviewed.  Constitutional:      Appearance: He is  well-developed.  HENT:     Head: Normocephalic.  Cardiovascular:     Rate and Rhythm: Normal rate.  Pulmonary:     Effort: Pulmonary effort is normal.  Abdominal:     General: There is no distension.  Musculoskeletal:        General: Swelling and tenderness present. Normal range of motion.     Cervical back: Normal range of motion and neck supple.     Comments: Swelling right lower leg, patient has some swelling bilaterally but right leg is larger than left.  Skin:    General: Skin is warm.  Neurological:     General: No focal deficit present.     Mental Status: He is alert and oriented to person, place, and time.  Psychiatric:        Mood and Affect: Mood normal.     ED Results / Procedures / Treatments   Labs (all labs ordered are listed, but only abnormal results are displayed) Labs Reviewed  CBC WITH DIFFERENTIAL/PLATELET - Abnormal; Notable for the following components:      Result Value   WBC 3.8 (*)    RBC 3.73 (*)    Hemoglobin 11.3 (*)    HCT 35.1 (*)    RDW 16.7 (*)    Lymphs Abs 0.4 (*)    All other components within normal limits  COMPREHENSIVE METABOLIC PANEL - Abnormal; Notable for the following components:   Glucose, Bld 158 (*)    Calcium 8.8 (*)    Total Protein 6.4 (*)    All other components within normal limits    EKG None  Radiology US SCROTUM W/DOPPLER  Result Date: 10/11/2022 CLINICAL DATA:  Four day history of scrotal swelling EXAM: SCROTAL ULTRASOUND DOPPLER ULTRASOUND OF THE TESTICLES TECHNIQUE: Complete ultrasound examination of the testicles, epididymis, and other scrotal structures was performed. Color and spectral Doppler ultrasound were also utilized to evaluate blood flow to the testicles. COMPARISON:  None Available. FINDINGS: Right testicle Measurements: 4.0 x 2.8 x 2.4 cm, 14.5 mL. No mass or microlithiasis visualized. Left testicle Measurements: 3.6 x 2.5 x 2.4 cm, 11.1 mL. No mass or microlithiasis visualized. Right epididymis:   Small epididymal head cyst measures 4 x 3 x 3 mm. Left epididymis:  Normal in size and appearance. Hydrocele:  Small bilateral hydroceles. Varicocele:  None visualized. Pulsed Doppler interrogation of both testes demonstrates normal low resistance arterial and venous waveforms bilaterally. Diffuse soft tissue edema of the overlying scrotum. IMPRESSION: 1. Diffuse soft tissue edema of the overlying scrotum. 2. Small bilateral hydroceles. 3. No evidence  of testicular torsion. Electronically Signed   By: Agustin Cree M.D.   On: 10/11/2022 16:50   US Venous Img Lower Right (DVT Study)  Result Date: 10/11/2022 CLINICAL DATA:  Leg swelling and pain since Monday. History of rectal cancer. EXAM: Right LOWER EXTREMITY VENOUS DOPPLER ULTRASOUND TECHNIQUE: Gray-scale sonography with compression, as well as color and duplex ultrasound, were performed to evaluate the deep venous system(s) from the level of the common femoral vein through the popliteal and proximal calf veins. COMPARISON:  Ultrasound 06/04/2022 FINDINGS: VENOUS Normal compressibility of the common femoral, superficial femoral, and popliteal veins, as well as the visualized calf veins. Visualized portions of profunda femoral vein and great saphenous vein unremarkable. No filling defects to suggest DVT on grayscale or color Doppler imaging. Doppler waveforms show normal direction of venous flow, normal respiratory plasticity and response to augmentation. Limited views of the contralateral common femoral vein are unremarkable. OTHER Few prominent inguinal nodes identified. These are enlarged up to 2.3 x 2.6 cm but this has a fatty hilum and normal morphology. These could be reactive. Please correlate clinical findings. There also soft tissue edema in the calf. Limitations: none IMPRESSION: No evidence of right lower extremity DVT. Calf edema with enlarged but normally morphologic inguinal lymph nodes. Please correlate with clinical findings Electronically Signed    By: Karen Kays M.D.   On: 10/11/2022 16:47    Procedures Procedures    Medications Ordered in ED Medications  HYDROmorphone (DILAUDID) injection 2 mg (2 mg Intramuscular Given 10/11/22 1810)    ED Course/ Medical Decision Making/ A&P                                 Medical Decision Making Complains of swelling in his right scrotum and his right leg.  He is currently being treated by oncology for rectal carcinoma with metastatic disease  Amount and/or Complexity of Data Reviewed Labs: ordered. Decision-making details documented in ED Course.    Details: Labs ordered reviewed and interpreted patient has normal white blood cell count hemoglobin is 11.3.  Os is 158. Radiology: ordered and independent interpretation performed. Decision-making details documented in ED Course.    Details: Doppler ultrasound of right leg and scrotum are obtained.  Radiologist notes reviewed.  Radiologist reports multiple lymph nodes right lower extremity and diffuse soft tissue edema of right scrotum.  Risk Prescription drug management. Risk Details: Patient is counseled on results.  He is advised to call his oncologist on Monday to schedule follow-up.  Patient is advised that swelling is coming from lymphadenopathy.  Patient may need to have a follow-up PET scan for further evaluation.  Patient understands and agrees to plan.           Final Clinical Impression(s) / ED Diagnoses Final diagnoses:  Lymphadenopathy    Rx / DC Orders ED Discharge Orders     None      An After Visit Summary was printed and given to the patient.    Elson Areas, PA-C 10/11/22 2245    Coral Spikes, DO 10/11/22 [phone]

## 2022-10-11 NOTE — ED Notes (Signed)
Medicated per [facility] Vitals listed

## 2022-10-11 NOTE — ED Provider Triage Note (Signed)
Emergency Medicine Provider Triage Evaluation Note  Joseph Hogan , a 62 y.o. male  was evaluated in triage.  Pt complains of pain in his right scrotum and pain in his right leg.  Pt recently treated with prednisone.  Pt is on chemo currently for colorectal cancer.  Pt reports he has a history of bone mets.   Review of Systems  Positive: Right leg swelling  Negative: fever  Physical Exam  BP (!) 143/84 (BP Location: Right Arm)   Pulse 92   Temp 100 F (37.8 C) (Oral)   Resp 16   Ht 5\' 11"  (1.803 m)   Wt 96.2 kg   SpO2 98%   BMI 29.57 kg/m  Gen:   Awake, no distress   Resp:  Normal effort  MSK:   Moves extremities without difficulty  Other:    Medical Decision Making  Medically screening exam initiated at 3:09 PM.  Appropriate orders placed.  TJADEN BLAMER was informed that the remainder of the evaluation will be completed by another provider, this initial triage assessment does not replace that evaluation, and the importance of remaining in the ED until their evaluation is complete.     Elson Areas, [place] 10/11/22 1550

## 2022-10-11 NOTE — Discharge Instructions (Addendum)
Return if any problems.

## 2022-10-11 NOTE — ED Triage Notes (Signed)
Chemo pt, colon and bone  Back of RIGHT thigh pain started MONDAY Denies numbness in foot Denies falls

## 2022-10-12 ENCOUNTER — Encounter: Payer: Self-pay | Admitting: Hematology and Oncology

## 2022-10-14 ENCOUNTER — Inpatient Hospital Stay (HOSPITAL_COMMUNITY)
Admission: EM | Admit: 2022-10-14 | Discharge: 2022-10-21 | DRG: 948 | Disposition: A | Discharge status: Home Health | Payer: Medicare HMO | Attending: Internal Medicine | Admitting: Internal Medicine

## 2022-10-14 ENCOUNTER — Emergency Department (HOSPITAL_COMMUNITY): Payer: Medicare HMO

## 2022-10-14 ENCOUNTER — Other Ambulatory Visit: Payer: Self-pay

## 2022-10-14 ENCOUNTER — Encounter (HOSPITAL_COMMUNITY): Payer: Self-pay | Admitting: Radiology

## 2022-10-14 DIAGNOSIS — Z811 Family history of alcohol abuse and dependence: Secondary | ICD-10-CM | POA: Diagnosis not present

## 2022-10-14 DIAGNOSIS — T402X5A Adverse effect of other opioids, initial encounter: Secondary | ICD-10-CM | POA: Diagnosis present

## 2022-10-14 DIAGNOSIS — Z96642 Presence of left artificial hip joint: Secondary | ICD-10-CM | POA: Diagnosis present

## 2022-10-14 DIAGNOSIS — C20 Malignant neoplasm of rectum: Secondary | ICD-10-CM

## 2022-10-14 DIAGNOSIS — E1165 Type 2 diabetes mellitus with hyperglycemia: Secondary | ICD-10-CM | POA: Diagnosis present

## 2022-10-14 DIAGNOSIS — I444 Left anterior fascicular block: Secondary | ICD-10-CM | POA: Diagnosis present

## 2022-10-14 DIAGNOSIS — R590 Localized enlarged lymph nodes: Secondary | ICD-10-CM | POA: Diagnosis present

## 2022-10-14 DIAGNOSIS — R21 Rash and other nonspecific skin eruption: Secondary | ICD-10-CM | POA: Diagnosis present

## 2022-10-14 DIAGNOSIS — N5089 Other specified disorders of the male genital organs: Secondary | ICD-10-CM | POA: Diagnosis present

## 2022-10-14 DIAGNOSIS — M25512 Pain in left shoulder: Secondary | ICD-10-CM | POA: Diagnosis present

## 2022-10-14 DIAGNOSIS — Z79899 Other long term (current) drug therapy: Secondary | ICD-10-CM | POA: Diagnosis not present

## 2022-10-14 DIAGNOSIS — Z8249 Family history of ischemic heart disease and other diseases of the circulatory system: Secondary | ICD-10-CM

## 2022-10-14 DIAGNOSIS — M79604 Pain in right leg: Secondary | ICD-10-CM | POA: Diagnosis present

## 2022-10-14 DIAGNOSIS — K5903 Drug induced constipation: Secondary | ICD-10-CM | POA: Diagnosis present

## 2022-10-14 DIAGNOSIS — C7951 Secondary malignant neoplasm of bone: Secondary | ICD-10-CM | POA: Diagnosis present

## 2022-10-14 DIAGNOSIS — E119 Type 2 diabetes mellitus without complications: Secondary | ICD-10-CM | POA: Diagnosis not present

## 2022-10-14 DIAGNOSIS — D500 Iron deficiency anemia secondary to blood loss (chronic): Secondary | ICD-10-CM | POA: Diagnosis present

## 2022-10-14 DIAGNOSIS — E876 Hypokalemia: Secondary | ICD-10-CM | POA: Diagnosis present

## 2022-10-14 DIAGNOSIS — D630 Anemia in neoplastic disease: Secondary | ICD-10-CM | POA: Diagnosis present

## 2022-10-14 DIAGNOSIS — Y92009 Unspecified place in unspecified non-institutional (private) residence as the place of occurrence of the external cause: Secondary | ICD-10-CM | POA: Diagnosis not present

## 2022-10-14 DIAGNOSIS — C19 Malignant neoplasm of rectosigmoid junction: Secondary | ICD-10-CM | POA: Diagnosis present

## 2022-10-14 DIAGNOSIS — E785 Hyperlipidemia, unspecified: Secondary | ICD-10-CM | POA: Diagnosis present

## 2022-10-14 DIAGNOSIS — I10 Essential (primary) hypertension: Secondary | ICD-10-CM | POA: Diagnosis present

## 2022-10-14 DIAGNOSIS — Z888 Allergy status to other drugs, medicaments and biological substances status: Secondary | ICD-10-CM | POA: Diagnosis not present

## 2022-10-14 DIAGNOSIS — M19012 Primary osteoarthritis, left shoulder: Secondary | ICD-10-CM | POA: Diagnosis not present

## 2022-10-14 DIAGNOSIS — M47816 Spondylosis without myelopathy or radiculopathy, lumbar region: Secondary | ICD-10-CM | POA: Diagnosis not present

## 2022-10-14 DIAGNOSIS — R9431 Abnormal electrocardiogram [ECG] [EKG]: Secondary | ICD-10-CM | POA: Diagnosis not present

## 2022-10-14 DIAGNOSIS — Z885 Allergy status to narcotic agent status: Secondary | ICD-10-CM | POA: Diagnosis not present

## 2022-10-14 DIAGNOSIS — G893 Neoplasm related pain (acute) (chronic): Secondary | ICD-10-CM | POA: Diagnosis present

## 2022-10-14 DIAGNOSIS — Z820 Family history of epilepsy and other diseases of the nervous system: Secondary | ICD-10-CM

## 2022-10-14 DIAGNOSIS — E538 Deficiency of other specified B group vitamins: Secondary | ICD-10-CM | POA: Diagnosis present

## 2022-10-14 DIAGNOSIS — R52 Pain, unspecified: Principal | ICD-10-CM

## 2022-10-14 LAB — CBC
HCT: 32.3 % — ABNORMAL LOW (ref 39.0–52.0)
Hemoglobin: 10.5 g/dL — ABNORMAL LOW (ref 13.0–17.0)
MCH: 30 pg (ref 26.0–34.0)
MCHC: 32.5 g/dL (ref 30.0–36.0)
MCV: 92.3 fL (ref 80.0–100.0)
Platelets: 188 10*3/uL (ref 150–400)
RBC: 3.5 MIL/uL — ABNORMAL LOW (ref 4.22–5.81)
RDW: 16.5 % — ABNORMAL HIGH (ref 11.5–15.5)
WBC: 4.1 10*3/uL (ref 4.0–10.5)
nRBC: 0 % (ref 0.0–0.2)

## 2022-10-14 LAB — COMPREHENSIVE METABOLIC PANEL
ALT: 14 U/L (ref 0–44)
AST: 52 U/L — ABNORMAL HIGH (ref 15–41)
Albumin: 3.5 g/dL (ref 3.5–5.0)
Alkaline Phosphatase: 177 U/L — ABNORMAL HIGH (ref 38–126)
Anion gap: 12 (ref 5–15)
BUN: 15 mg/dL (ref 8–23)
CO2: 22 mmol/L (ref 22–32)
Calcium: 8.6 mg/dL — ABNORMAL LOW (ref 8.9–10.3)
Chloride: 102 mmol/L (ref 98–111)
Creatinine, Ser: 0.76 mg/dL (ref 0.61–1.24)
GFR, Estimated: >60 mL/min (ref >60)
Glucose, Bld: 128 mg/dL — ABNORMAL HIGH (ref 70–99)
Potassium: 3.7 mmol/L (ref 3.5–5.1)
Sodium: 136 mmol/L (ref 135–145)
Total Bilirubin: 1.1 mg/dL (ref 0.3–1.2)
Total Protein: 6.9 g/dL (ref 6.5–8.1)

## 2022-10-14 LAB — URINALYSIS, ROUTINE W REFLEX MICROSCOPIC
Bilirubin Urine: NEGATIVE
Glucose, UA: NEGATIVE mg/dL
Hgb urine dipstick: NEGATIVE
Ketones, ur: 20 mg/dL — AB
Leukocytes,Ua: NEGATIVE
Nitrite: NEGATIVE
Protein, ur: NEGATIVE mg/dL
Specific Gravity, Urine: 1.04 — ABNORMAL HIGH (ref 1.005–1.030)
pH: 5 (ref 5.0–8.0)

## 2022-10-14 LAB — RETICULOCYTES
Immature Retic Fract: 13.2 % (ref 2.3–15.9)
RBC.: 3.47 MIL/uL — ABNORMAL LOW (ref 4.22–5.81)
Retic Count, Absolute: 84.3 10*3/uL (ref 19.0–186.0)
Retic Ct Pct: 2.4 % (ref 0.4–3.1)

## 2022-10-14 LAB — PHOSPHORUS: Phosphorus: 4 mg/dL (ref 2.5–4.6)

## 2022-10-14 LAB — MAGNESIUM: Magnesium: 2 mg/dL (ref 1.7–2.4)

## 2022-10-14 LAB — CK: Total CK: 182 U/L (ref 49–397)

## 2022-10-14 MED ORDER — ONDANSETRON HCL 4 MG/2ML IJ SOLN
4.0000 mg | Freq: Once | INTRAMUSCULAR | Status: AC
  Administered 2022-10-14: 4 mg via INTRAVENOUS
  Filled 2022-10-14: qty 2

## 2022-10-14 MED ORDER — FENTANYL CITRATE PF 50 MCG/ML IJ SOSY
50.0000 ug | PREFILLED_SYRINGE | Freq: Once | INTRAMUSCULAR | Status: AC
  Administered 2022-10-14: 50 ug via INTRAVENOUS
  Filled 2022-10-14: qty 1

## 2022-10-14 MED ORDER — HYDROMORPHONE HCL 1 MG/ML IJ SOLN
0.5000 mg | INTRAMUSCULAR | Status: DC | PRN
  Administered 2022-10-15 (×3): 1 mg via INTRAVENOUS
  Filled 2022-10-14 (×2): qty 1

## 2022-10-14 MED ORDER — HYDROMORPHONE HCL 1 MG/ML IJ SOLN
0.5000 mg | Freq: Once | INTRAMUSCULAR | Status: AC
  Administered 2022-10-14: 0.5 mg via INTRAVENOUS
  Filled 2022-10-14: qty 1

## 2022-10-14 MED ORDER — SODIUM CHLORIDE 0.9 % IV BOLUS
1000.0000 mL | Freq: Once | INTRAVENOUS | Status: AC
  Administered 2022-10-14: 1000 mL via INTRAVENOUS

## 2022-10-14 MED ORDER — IOHEXOL 300 MG/ML  SOLN
100.0000 mL | Freq: Once | INTRAMUSCULAR | Status: AC | PRN
  Administered 2022-10-14: 100 mL via INTRAVENOUS

## 2022-10-14 MED ORDER — INSULIN ASPART 100 UNIT/ML IJ SOLN
0.0000 [IU] | Freq: Every day | INTRAMUSCULAR | Status: DC
  Filled 2022-10-14: qty 0.05

## 2022-10-14 MED ORDER — INSULIN ASPART 100 UNIT/ML IJ SOLN
0.0000 [IU] | Freq: Three times a day (TID) | INTRAMUSCULAR | Status: DC
  Administered 2022-10-15: 3 [IU] via SUBCUTANEOUS
  Administered 2022-10-16 – 2022-10-17 (×3): 2 [IU] via SUBCUTANEOUS
  Administered 2022-10-17: 3 [IU] via SUBCUTANEOUS
  Administered 2022-10-18: 2 [IU] via SUBCUTANEOUS
  Administered 2022-10-18: 3 [IU] via SUBCUTANEOUS
  Administered 2022-10-18: 2 [IU] via SUBCUTANEOUS
  Administered 2022-10-19: 3 [IU] via SUBCUTANEOUS
  Administered 2022-10-20 – 2022-10-21 (×4): 2 [IU] via SUBCUTANEOUS
  Filled 2022-10-14: qty 0.15

## 2022-10-14 NOTE — ED Triage Notes (Signed)
Pt was saw on the 30th at [facility] for the same. Pt here with complaints of bilateral leg pain so severe that he is unable to sleep. Pt tearful in triage and request that he not get a IM shot of Dilaudid again due to having had it at [facility] and he states it made him nauseous. Pt states he is currently receiving chemotherapy for colon cancer.

## 2022-10-14 NOTE — Assessment & Plan Note (Signed)
Appreciate oncology consult Severe pain Would benefit from pain control May need radiation therapy for pain management secondary to ongoing severe pelvic metastases

## 2022-10-14 NOTE — Assessment & Plan Note (Signed)
Allow permissive hypertension 

## 2022-10-14 NOTE — ED Provider Notes (Signed)
[place] EMERGENCY DEPARTMENT AT [facility] Provider Note   CSN: [id number] Arrival date & time: 10/14/22  1515     History  Chief Complaint  Patient presents with   Leg Pain    Joseph Hogan is a 62 y.o. male.  Who presents emergency department with severe leg pain.  He has a past medical history of rectal cancer and is currently under treatment with Dr. Sheria Lang.  Patient was seen at [facility] emergency department on 10/11/2022.  He reports that at that time he is having severe pain radiating down the back of his leg.  He is also having swelling in his lower extremities.  Patient was recently on prednisone and this did not help with his pain.  Patient also takes Xtampza, oxycodone and gabapentin.  He states that this pain is not relieved by any of his medications and is severe.  He is describes it as "like a deep toothache."  He reports that it is now radiating into his left lower extremity.  Patient rates the pain as severe.  He denies any saddle anesthesia, loss of bowel or bladder control or weakness in the lower extremities.  Patient reports that he had ultrasound of both legs that were negative, scrotal Doppler done and had no acute findings.   Leg Pain      Home Medications Prior to Admission medications   Medication Sig Start Date End Date Taking? Authorizing Provider  b complex vitamins capsule Take 1 capsule by mouth daily.    [provider]  Clotrimazole 1 % LOTN Apply to groin rash twice per day 09/11/22   Georga Kaufmann T, PA-C  doxycycline (VIBRA-TABS) 100 MG tablet Take 1 tablet (100 mg total) by mouth 2 (two) times daily. 07/31/22   [name], MD  gabapentin (NEURONTIN) 300 MG capsule Take 1 capsule (300 mg total) by mouth at bedtime. Patient not taking: Reported on 07/31/2022 06/06/22   Ulysees Barns IV, MD  hydrocortisone 2.5 % cream Apply topically 2 (two) times daily. Patient not taking: Reported on 08/14/2022 08/06/22   [name], MD  lidocaine-prilocaine (EMLA) cream Apply a quarter-sized amount to port a cath site and cover with plastic wrap one hour prior to infusion appointments Patient not taking: Reported on 09/11/2022 08/27/22   [name], MD  magnesium oxide (MAG-OX) 400 (240 Mg) MG tablet Take 400 mg by mouth daily.    [provider]  Multiple Vitamin (MULTIVITAMIN WITH MINERALS) TABS tablet Take 1 tablet by mouth daily.    [provider]  ondansetron (ZOFRAN) 8 MG tablet Take 2 tablets (16 mg total) by mouth every 8 (eight) hours as needed for nausea or vomiting. Patient not taking: Reported on 08/14/2022 08/02/22   [name], MD  oxyCODONE (OXY IR/ROXICODONE) 5 MG immediate release tablet Take 1 tablet (5 mg total) by mouth every 4 (four) hours as needed for severe pain. 10/02/22   [name], MD  pantoprazole (PROTONIX) 20 MG tablet Take 1 tablet (20 mg total) by mouth daily. 09/16/22   [name], MD  predniSONE (DELTASONE) 5 MG tablet Take 2 tablets (10 mg total) by mouth daily with breakfast. 10/02/22   [name], MD  prochlorperazine (COMPAZINE) 10 MG tablet Take 1 tablet (10 mg total) by mouth every 6 (six) hours as needed for nausea or vomiting. Patient not taking: Reported on 08/14/2022 08/02/22  [name], MD  tiZANidine (ZANAFLEX) 4 MG tablet Take 1 tablet (4 mg total) by mouth 3 (three) times daily. Patient not taking: Reported on 08/14/2022 07/31/22   [name], MD  VITAMIN A PO Take 1 tablet by mouth daily.    [provider]  XTAMPZA ER 13.5 MG C12A TAKE 1 CAPSULE BY MOUTH TWICE DAILY 08/16/22   Briant Cedar, PA-C      Allergies    Codeine and Lisinopril    Review of Systems   Review of Systems  Physical Exam Updated Vital Signs BP (!) 170/80   Pulse 88   Temp 98.3 F (36.8 C) (Oral)   Resp 18   Ht 5\' 11"  (1.803 m)   Wt 96.2 kg   SpO2 100%   BMI 29.57 kg/m  Physical Exam Vitals and nursing note reviewed.   Constitutional:      General: He is not in acute distress.    Appearance: He is well-developed. He is not diaphoretic.  HENT:     Head: Normocephalic and atraumatic.  Eyes:     General: No scleral icterus.    Conjunctiva/sclera: Conjunctivae normal.  Cardiovascular:     Rate and Rhythm: Normal rate and regular rhythm.     Heart sounds: Normal heart sounds.  Pulmonary:     Effort: Pulmonary effort is normal. No respiratory distress.     Breath sounds: Normal breath sounds.  Abdominal:     Palpations: Abdomen is soft.     Tenderness: There is no abdominal tenderness.  Musculoskeletal:     Cervical back: Normal range of motion and neck supple.     Right lower leg: Edema present.     Left lower leg: Edema present.  Skin:    General: Skin is warm and dry.  Neurological:     Mental Status: He is alert.     Comments: Normal strength in the bilateral lower extremities, DP and PT pulse 2+.  Psychiatric:        Behavior: Behavior normal.     ED Results / Procedures / Treatments   Labs (all labs ordered are listed, but only abnormal results are displayed) Labs Reviewed  CBC  COMPREHENSIVE METABOLIC PANEL  URINALYSIS, ROUTINE W REFLEX MICROSCOPIC    EKG None  Radiology No results found.  Procedures Procedures    Medications Ordered in ED Medications  sodium chloride 0.9 % bolus 1,000 mL (1,000 mLs Intravenous New Bag/Given 10/14/22 1752)  fentaNYL (SUBLIMAZE) injection 50 mcg (50 mcg Intravenous Given 10/14/22 1753)  ondansetron (ZOFRAN) injection 4 mg (4 mg Intravenous Given 10/14/22 1753)    ED Course/ Medical Decision Making/ A&P                                 Medical Decision Making Amount and/or Complexity of Data Reviewed Labs: ordered. Radiology: ordered.  Risk Prescription drug management. Decision regarding hospitalization.   Patient was previously on the following regimen:FOLFIRI +Panitumumab.  He had a severe reaction to the FOLFIRI which caused  him to be hospitalized.  Patient was on steroids for that.  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  Patient is now complaining of severe pain in his back and down the backside of his left hip.  I ordered labs including urine, CBC, CMP all of which showed no acute  findings.  He does have a slightly elevated blood glucose.  I ordered and interpreted CT chest abdomen and pelvis along with a CT L-spine.  Findings show multiple expansile rib lesions, expansile metastatic lesions in the left pelvis that are bulky and unchanged from prior findings.  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  Patient is having severe pain despite multiple rounds of narcotic IV pain medication.  He is taking Xtampza at home which she states has done nothing to help his pain.   Discussed with Dr. Mosetta Putt she states that they will round on the patient in the hospital and may or may not decide upon some palliative radiation therapy.  The patient will need admission and likely need some palliative care discussions for pain control.     Document critical care time when appropriate:1}      Final Clinical Impression(s) / ED Diagnoses Final diagnoses:  None    Rx / DC Orders ED Discharge Orders     None         Arthor Captain, PA-C 10/16/22 1049    Durwin Glaze, MD 10/21/22 [phone]

## 2022-10-14 NOTE — H&P (Signed)
BAYLEN ALLUMS QIO:[id number] DOB: 03-10-1960 DOA: 10/14/2022     PCP: [name], MD   Outpatient Specialists:      Oncology   Dr.Katragadda, Vern Claude, MD   Dr. Lenda Kelp  Patient arrived to ER on 10/14/22 at 1515 Referred by Attending Durwin Glaze, MD   Patient coming from:    home Lives alone,     Chief Complaint:   Chief Complaint  Patient presents with   Leg Pain    HPI: JOSHIAH SCHAAL is a 62 y.o. male with medical history significant of  rectal carcinoma with bone mets, DM2, HTN, HLD     Presented with  leg pain  Pt with rectal carcinoma and mets to the bone, he has been seen in ER on 8/30 for leg pain in his right leg US showed   Radiologist reports multiple lymph nodes right lower extremity and diffuse soft tissue edema of right scrotum.  the pain has been severe and he came back to ER  No saddle anesthesia no loss of bowel or bladder control  s history of previous pathologic fracture of the left femoral head with total left hip replacement. Now with pain radiating to his left leg too Pain was uncontrolled in ER   Denies significant ETOH intake   Does not smoke   No results found for: "SARSCOV2NAA"      Regarding pertinent Chronic problems:       DM 2 -  Lab Results  Component Value Date   HGBA1C 7.4 (H) 04/12/2022    diet controlled  Chronic anemia - baseline hg Hemoglobin & Hematocrit  Recent Labs    10/02/22 0947 10/11/22 1523 10/14/22 1745  HGB 11.2* 11.3* 10.5*   Iron/TIBC/Ferritin/ %Sat    Component Value Date/Time   IRON 24 (L) 06/18/2022 0928   TIBC 196 (L) 06/18/2022 0928   FERRITIN 1,096 (H) 06/18/2022 0928   IRONPCTSAT 12 (L) 06/18/2022 0928     Cancer: followed by Oncology at [facility]    While in ER:     Pain uncontrolled despite repeat doses of dilaudid    Lab Orders         CBC         Comprehensive metabolic panel         Urinalysis, Routine w reflex microscopic -Urine, Clean Catch      CT chestabd/pelvis - Multiple  expansile rib lesions again noted.   Moderate volume stool  Expansile metastatic lesions in the LEFT pelvis. These lesions  are extremely bulky but not changed from prior.  Following Medications were ordered in ER: Medications  HYDROmorphone (DILAUDID) injection 0.5 mg (has no administration in time range)  sodium chloride 0.9 % bolus 1,000 mL (0 mLs Intravenous Stopped 10/14/22 1914)  fentaNYL (SUBLIMAZE) injection 50 mcg (50 mcg Intravenous Given 10/14/22 1753)  ondansetron (ZOFRAN) injection 4 mg (4 mg Intravenous Given 10/14/22 1753)  iohexol (OMNIPAQUE) 300 MG/ML solution 100 mL (100 mLs Intravenous Contrast Given 10/14/22 1830)    _______________________________________________________ ER Provider Called:      Oncology Dr. Mosetta Putt They Recommend admit to medicine   Will see in AM        ED Triage Vitals  Encounter Vitals Group     BP 10/14/22 1522 (!) 157/90     Systolic BP Percentile --      Diastolic BP Percentile --      Pulse Rate 10/14/22 1522 95     Resp 10/14/22 1522 16  Temp 10/14/22 1522 98.3 F (36.8 C)     Temp Source 10/14/22 1522 Oral     SpO2 10/14/22 1522 99 %     Weight 10/14/22 1527 212 lb (96.2 kg)     Height 10/14/22 1527 5\' 11"  (1.803 m)     Head Circumference --      Peak Flow --      Pain Score --      Pain Loc --      Pain Education --      Exclude from Growth Chart --   AOZH(08)@     _________________________________________ Significant initial  Findings: Abnormal Labs Reviewed  CBC - Abnormal; Notable for the following components:      Result Value   RBC 3.50 (*)    Hemoglobin 10.5 (*)    HCT 32.3 (*)    RDW 16.5 (*)    All other components within normal limits  COMPREHENSIVE METABOLIC PANEL - Abnormal; Notable for the following components:   Glucose, Bld 128 (*)    Calcium 8.6 (*)    AST 52 (*)    Alkaline Phosphatase 177 (*)    All other components within normal limits  URINALYSIS, ROUTINE W REFLEX MICROSCOPIC - Abnormal; Notable for  the following components:   Specific Gravity, Urine 1.040 (*)    Ketones, ur 20 (*)    All other components within normal limits     ECG: Ordered Personally reviewed and interpreted by me showing: HR : 80 Rhythm: Sinus rhythm Left anterior fascicular block Abnormal R-wave progression, late transition QTC 431     The recent clinical data is shown below. Vitals:   10/14/22 1549 10/14/22 1600 10/14/22 1805 10/14/22 1912  BP: (!) 170/79 (!) 170/80 (!) 163/91   Pulse: 89 88 87   Resp: 18 18 18    Temp:    98.5 F (36.9 C)  TempSrc:      SpO2: 99% 100% 98%   Weight:      Height:        WBC     Component Value Date/Time   WBC 4.1 10/14/2022 1745   LYMPHSABS 0.4 (L) 10/11/2022 1523   MONOABS 0.3 10/11/2022 1523   EOSABS 0.2 10/11/2022 1523   BASOSABS 0.0 10/11/2022 1523    Lactic Acid, Venous No results found for: "LATICACIDVEN"    UA   no evidence of UTI    Urine analysis:    Component Value Date/Time   COLORURINE YELLOW 10/14/2022 1913   APPEARANCEUR CLEAR 10/14/2022 1913   LABSPEC 1.040 (H) 10/14/2022 1913   PHURINE 5.0 10/14/2022 1913   GLUCOSEU NEGATIVE 10/14/2022 1913   HGBUR NEGATIVE 10/14/2022 1913   BILIRUBINUR NEGATIVE 10/14/2022 1913   BILIRUBINUR SMALL 02/13/2016 1437   KETONESUR 20 (A) 10/14/2022 1913   PROTEINUR NEGATIVE 10/14/2022 1913   UROBILINOGEN 0.2 02/13/2016 1437   NITRITE NEGATIVE 10/14/2022 1913   LEUKOCYTESUR NEGATIVE 10/14/2022 1913    Results for orders placed or performed during the hospital encounter of 02/03/22  MRSA Next Gen by PCR, Nasal     Status: None   Collection Time: 02/05/22 10:42 PM   Specimen: Nasal Mucosa; Nasal Swab  Result Value Ref Range Status   MRSA by PCR Next Gen NOT DETECTED NOT DETECTED Final    Comment: (NOTE) The GeneXpert MRSA Assay (FDA approved for NASAL specimens only), is one component of a comprehensive MRSA colonization surveillance program. It is not intended to diagnose MRSA infection nor to  guide or monitor  treatment for MRSA infections. Test performance is not FDA approved in patients less than 23 years old. Performed at [facility], [address], [place], [place] [address]     ___________________________________________________ Recent Labs  Lab 10/11/22 1523 10/14/22 1745  NA 139 136  K 4.5 3.7  CO2 25 22  GLUCOSE 158* 128*  BUN 20 15  CREATININE 0.74 0.76  CALCIUM 8.8* 8.6*    Cr   stable,   Lab Results  Component Value Date   CREATININE 0.76 10/14/2022   CREATININE 0.74 10/11/2022   CREATININE 0.46 (L) 10/02/2022    Recent Labs  Lab 10/11/22 1523 10/14/22 1745  AST 27 52*  ALT 16 14  ALKPHOS 114 177*  BILITOT 0.5 1.1  PROT 6.4* 6.9  ALBUMIN 3.5 3.5   Lab Results  Component Value Date   CALCIUM 8.6 (L) 10/14/2022   PHOS 5.3 (H) 02/06/2022    Plt: Lab Results  Component Value Date   PLT 188 10/14/2022       Recent Labs  Lab 10/11/22 1523 10/14/22 1745  WBC 3.8* 4.1  NEUTROABS 2.8  --   HGB 11.3* 10.5*  HCT 35.1* 32.3*  MCV 94.1 92.3  PLT 224 188    HG/HCT  stable,      Component Value Date/Time   HGB 10.5 (L) 10/14/2022 1745   HGB 11.2 (L) 10/02/2022 0947   HCT 32.3 (L) 10/14/2022 1745   MCV 92.3 10/14/2022 1745    _______________________________________________ Hospitalist was called for admission for   Intractable pain    Rectal cancer metastatic to bone [facility])    The following Work up has been ordered so far:  Orders Placed This Encounter  Procedures   CT CHEST ABDOMEN PELVIS W CONTRAST   CT L-SPINE NO CHARGE   CBC   Comprehensive metabolic panel   Urinalysis, Routine w reflex microscopic -Urine, Clean Catch   Consult to oncology   Consult to hospitalist     OTHER Significant initial  Findings:  labs showing:     DM  labs:  HbA1C: Recent Labs    04/12/22 1040  HGBA1C 7.4*    CBG (last 3)  No results for input(s): "GLUCAP" in the last 72 hours.        Cultures:    Component Value  Date/Time   SDES ABSCESS RIGHT FOREARM 09/06/2014 1759   SPECREQUEST NONE 09/06/2014 1759   CULT  09/06/2014 1759    ABUNDANT METHICILLIN RESISTANT STAPHYLOCOCCUS AUREUS Note: RIFAMPIN AND GENTAMICIN SHOULD NOT BE USED AS SINGLE DRUGS FOR TREATMENT OF STAPH INFECTIONS. This organism DOES NOT demonstrate inducible Clindamycin resistance in vitro. Performed at [facility]    REPTSTATUS 09/11/2014 FINAL 09/06/2014 1759     Radiological Exams on Admission: CT CHEST ABDOMEN PELVIS W CONTRAST  Result Date: 10/14/2022 CLINICAL DATA:  Colorectal carcinoma staging. Bilateral leg pain which is severe. 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 * Tracking Code: BO * EXAM: CT CHEST, ABDOMEN, AND PELVIS WITH CONTRAST TECHNIQUE: Multidetector CT imaging of the chest, abdomen and pelvis was performed following the standard protocol during bolus administration of intravenous contrast. RADIATION DOSE REDUCTION: This exam was performed according to the departmental dose-optimization program which includes automated exposure control, adjustment of the mA and/or kV according to patient size and/or use of iterative reconstruction technique. CONTRAST:  OMNIPAQUE IOHEXOL 300 MG/ML  SOLN COMPARISON:  CT pelvis 07/23/2022, PET-CT scan 07/11/2022 FINDINGS: CT CHEST FINDINGS CT CHEST FINDINGS Cardiovascular: [facility]  in the anterior chest wall with tip in distal SVC. No significant vascular findings. Normal heart size. No pericardial effusion. Mediastinum/Nodes: No axillary or supraclavicular adenopathy. No mediastinal or hilar adenopathy. No pericardial fluid. Esophagus normal. Lungs/Pleura: No suspicious pulmonary nodules. Normal pleural. Airways normal. Musculoskeletal: Expansile rib lesions hypermetabolic on comparison PET-CT scan. Lesion the anterior LEFT third rib in the anterior RIGHT fourth rib. Findings similar to PET-CT 07/11/2022 CT ABDOMEN AND PELVIS FINDINGS Hepatobiliary: No  focal hepatic lesion. Pancreas: Pancreas is normal. No ductal dilatation. No pancreatic inflammation. Spleen: Normal spleen Adrenals/urinary tract: Adrenal glands normal. Simple fluid attenuation cyst in the RIGHT kidney. Right Bosniak I benign renal cyst. No follow-up imaging is recommended. JACR 2018 Feb; 264-273, Management of the Incidental Renal Mass on CT, [employer] 2021; 814-848, Bosniak Classification of Cystic Renal Masses, Version 2019. Ureters and bladder normal Stomach/Bowel: Stomach, small bowel, appendix, and cecum are normal. Moderate volume stool throughout the entire colon. Vascular/Lymphatic: Abdominal aorta is normal caliber with atherosclerotic calcification. There is no retroperitoneal or periportal lymphadenopathy. No pelvic lymphadenopathy. Reproductive: Unremarkable Other: Haziness in the presacral space may relate to prior radiation treatment. Musculoskeletal: Multiple expansile metastatic lesions in the pelvis. These lesions are bulky benign signal change from prior. For example lesion involving the RIGHT inferior ischium measuring 7.5 by 4.1 cm (image 118/3. These lesions are hypermetabolic on comparison PET-CT scan. Extensive involvement of the LEFT pelvis and sacrum. No pathologic fracture. LEFT hip prosthetic. IMPRESSION: 1. No pulmonary metastasis.  No lymphadenopathy 2. Multiple expansile rib lesions again noted. 3. No evidence of bowel obstruction. Moderate volume stool throughout the colon suggest constipation. 4. Expansile metastatic lesions in the LEFT pelvis. These lesions are extremely bulky but not changed from prior. 5. No acute osseous findings. Electronically Signed   By: Genevive Bi M.D.   On: 10/14/2022 19:30   CT L-SPINE NO CHARGE  Result Date: 10/14/2022 CLINICAL DATA:  saw on the 30th at [facility] for the same. Pt here with complaints of bilateral leg pain so severe that he is unable to sleep. Pt tearful in triage and request that he not get a IM shot of  Dilaudid again. Colon cancer staging. EXAM: CT LUMBAR SPINE WITHOUT CONTRAST TECHNIQUE: Multidetector CT imaging of the lumbar spine was performed without intravenous contrast administration. Multiplanar CT image reconstructions were also generated. RADIATION DOSE REDUCTION: This exam was performed according to the departmental dose-optimization program which includes automated exposure control, adjustment of the mA and/or kV according to patient size and/or use of iterative reconstruction technique. COMPARISON:  X-ray lumbar spine 07/23/2022 FINDINGS: Segmentation: 5 lumbar type vertebrae with a sacralized L5. Alignment: Normal. Vertebrae: Multilevel mild to moderate degenerative changes of the spine. Posterior disc osteophyte complex formation at the L4-L5 level. No associated severe osseous neural foraminal or central canal stenosis. No acute fracture. Poorly defined wide zone of transition sclerotic lesions of the sacrum, L5 vertebral body, iliac bones suggestive of osseous metastases in the setting of colon cancer. Question similar finding of the spinous process of the L2 vertebral body. Paraspinal and other soft tissues: Negative. Disc levels: Maintained. IMPRESSION: 1. No acute displaced fracture or traumatic listhesis of the lumbar spine. 2. Lumbosacral and iliac findings suggestive of a sclerotic osseous metastases in the setting of colon cancer. 3. Please see separately dictated CT chest, abdomen, pelvis 10/14/2022. Electronically Signed   By: Tish Frederickson M.D.   On: 10/14/2022 19:17   _______________________________________________________________________________________________________ Latest  Blood pressure (!) 163/91, pulse 87, temperature 98.5 F (  36.9 C), resp. rate 18, height 5\' 11"  (1.803 m), weight 96.2 kg, SpO2 98%.   Vitals  labs and radiology finding personally reviewed  Review of Systems:    Pertinent positives include: left leg pain  Constitutional:  No weight loss, night  sweats, Fevers, chills, fatigue, weight loss  HEENT:  No headaches, Difficulty swallowing,Tooth/dental problems,Sore throat,  No sneezing, itching, ear ache, nasal congestion, post nasal drip,  Cardio-vascular:  No chest pain, Orthopnea, PND, anasarca, dizziness, palpitations.no Bilateral lower extremity swelling  GI:  No heartburn, indigestion, abdominal pain, nausea, vomiting, diarrhea, change in bowel habits, loss of appetite, melena, blood in stool, hematemesis Resp:  no shortness of breath at rest. No dyspnea on exertion, No excess mucus, no productive cough, No non-productive cough, No coughing up of blood.No change in color of mucus.No wheezing. Skin:  no rash or lesions. No jaundice GU:  no dysuria, change in color of urine, no urgency or frequency. No straining to urinate.  No flank pain.  Musculoskeletal:  No joint pain or no joint swelling. No decreased range of motion. No back pain.  Psych:  No change in mood or affect. No depression or anxiety. No memory loss.  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   with abnormal R wave progression noted on EKG   MRSA (methicillin resistant Staphylococcus aureus)    Pre-diabetes    Rectal carcinoma (HCC) 03/19/2022      Past Surgical History:  Procedure Laterality Date   COLONOSCOPY     HUMERUS IM NAIL Right 04/12/2022   Procedure: INTRAMEDULLARY (IM) NAIL HUMERAL;  Surgeon: Bjorn Pippin, MD;  Location: [facility];  Service: Orthopedics;  Laterality: Right;   IR IMAGING GUIDED PORT INSERTION  04/05/2022   TOTAL HIP  ARTHROPLASTY Left 02/06/2022   Procedure: TOTAL HIP ARTHROPLASTY;  Surgeon: Joen Laura, MD;  Location: [facility];  Service: Orthopedics;  Laterality: Left;    Social History:  Ambulatory  cane, walker      reports that he has never smoked. He has never used smokeless tobacco. He reports that he does not drink alcohol and does not use drugs.   Family History:   Family History  Problem Relation Age of Onset   Stroke Mother    Hypertension Mother    Heart disease Father    Alzheimer's disease Father    Hypertension Brother    Alcohol abuse Brother    Cancer Maternal Aunt    Heart disease Maternal Aunt    Cancer Maternal Uncle    Heart disease Maternal Uncle    Cancer Paternal Aunt    Heart disease Paternal Aunt    Cancer Paternal Uncle    Heart disease Paternal Uncle    Heart disease Paternal Grandmother    Alzheimer's disease Paternal Grandfather    Stomach cancer Neg Hx    Rectal cancer Neg Hx    Esophageal cancer Neg Hx    Colon cancer Neg Hx    ______________________________________________________________________________________________ Allergies: Allergies  Allergen Reactions   Codeine Nausea And Vomiting   Lisinopril Cough     Prior to Admission medications   Medication Sig Start Date End Date Taking?  Authorizing Provider  b complex vitamins capsule Take 1 capsule by mouth daily.    [provider]  Clotrimazole 1 % LOTN Apply to groin rash twice per day 09/11/22   Georga Kaufmann T, PA-C  doxycycline (VIBRA-TABS) 100 MG tablet Take 1 tablet (100 mg total) by mouth 2 (two) times daily. 07/31/22   [name], MD  gabapentin (NEURONTIN) 300 MG capsule Take 1 capsule (300 mg total) by mouth at bedtime. Patient not taking: Reported on 07/31/2022 06/06/22   Ulysees Barns IV, MD  hydrocortisone 2.5 % cream Apply topically 2 (two) times daily. Patient not taking: Reported on 08/14/2022 08/06/22   [name], MD  lidocaine-prilocaine (EMLA) cream  Apply a quarter-sized amount to port a cath site and cover with plastic wrap one hour prior to infusion appointments Patient not taking: Reported on 09/11/2022 08/27/22   [name], MD  magnesium oxide (MAG-OX) 400 (240 Mg) MG tablet Take 400 mg by mouth daily.    [provider]  Multiple Vitamin (MULTIVITAMIN WITH MINERALS) TABS tablet Take 1 tablet by mouth daily.    [provider]  ondansetron (ZOFRAN) 8 MG tablet Take 2 tablets (16 mg total) by mouth every 8 (eight) hours as needed for nausea or vomiting. Patient not taking: Reported on 08/14/2022 08/02/22   [name], MD  oxyCODONE (OXY IR/ROXICODONE) 5 MG immediate release tablet Take 1 tablet (5 mg total) by mouth every 4 (four) hours as needed for severe pain. 10/02/22   [name], MD  pantoprazole (PROTONIX) 20 MG tablet Take 1 tablet (20 mg total) by mouth daily. 09/16/22   [name], MD  predniSONE (DELTASONE) 5 MG tablet Take 2 tablets (10 mg total) by mouth daily with breakfast. 10/02/22   [name], MD  prochlorperazine (COMPAZINE) 10 MG tablet Take 1 tablet (10 mg total) by mouth every 6 (six) hours as needed for nausea or vomiting. Patient not taking: Reported on 08/14/2022 08/02/22   [name], MD  tiZANidine (ZANAFLEX) 4 MG tablet Take 1 tablet (4 mg total) by mouth 3 (three) times daily. Patient not taking: Reported on 08/14/2022 07/31/22   [name], MD  VITAMIN A PO Take 1 tablet by mouth daily.    [provider]  XTAMPZA ER 13.5 MG C12A TAKE 1 CAPSULE BY MOUTH TWICE DAILY 08/16/22   Briant Cedar, PA-C    ___________________________________________________________________________________________________ Physical Exam:    10/14/2022    6:05 PM 10/14/2022    4:00 PM 10/14/2022    3:49 PM  Vitals with BMI  Systolic 163 170 161  Diastolic 91 80 79  Pulse 87 88 89     1. General:  in No  Acute distress    Chronically ill   -appearing 2. Psychological:  Alert and   Oriented 3. Head/ENT:   Dry Mucous Membranes                          Head Non traumatic, neck supple                          Poor Dentition 4. SKIN:  decreased Skin turgor,  Skin clean Dry and intact no rash    5. Heart: Regular rate and rhythm no  Murmur, no Rub or gallop 6. Lungs:  no wheezes or crackles  7. Abdomen: Soft,  non-tender, Non distended bowel sounds present 8. Lower extremities: no clubbing, cyanosis, no edema 9. Neurologically Grossly intact, moving all 4 extremities equally  10. MSK: Normal range of motion    Chart has been reviewed  ______________________________________________________________________________________________  Assessment/Plan 63 y.o. male with medical history significant of  rectal carcinoma with bone mets, DM2, HTN, HLD  Admitted for   Intractable pain  Rectal cancer metastatic to bone [facility])    Present on Admission:  Essential hypertension, benign  Rectal carcinoma (HCC)  Iron deficiency anemia due to chronic blood loss  Cancer associated pain   Essential hypertension, benign Allow permissive hypertension  Diabetes mellitus without complication (HCC) Order sliding scale and monitor  Rectal carcinoma (HCC) Appreciate oncology consult Severe pain Would benefit from pain control May need radiation therapy for pain management secondary to ongoing severe pelvic metastases  Iron deficiency anemia due to chronic blood loss Obtain anemia panel hip transfuse as needed for hemoglobin below 7   Reports he has run out of his long lasting pain meds  On Saturday The short lasting one has run out long time ago  Other plan as per orders.  DVT prophylaxis:  SCD     Code Status:    Code Status: Prior FULL CODE  e as per patient   I had personally discussed CODE STATUS with patient   ACP   none   Family Communication:   Family not at  Bedside    Diet diabetic diet    Disposition Plan:            To home once workup is complete  and patient is stable   Following barriers for discharge:                                                          Pain controlled with PO medications                                                        Will need consultants to evaluate patient prior to discharge       Consult Orders  (From admission, onward)           Start     Ordered   10/14/22 2011  Consult to hospitalist  Once       Provider:  (Not yet assigned)  Question Answer Comment  Place call to: [facility]   Reason for Consult Admit      10/14/22 2010                               Would benefit from PT/OT eval prior to DC  Ordered                                      Consults called: oncology is aware   Admission status:  ED Disposition     ED Disposition  Admit   Condition  --   Comment  The patient appears reasonably stabilized  for admission considering the current resources, flow, and capabilities available in the ED at this time, and I doubt any other [facility] requiring further screening and/or treatment in the ED prior to admission is  present.           inpatient     I Expect 2 midnight stay secondary to severity of patient's current illness need for inpatient interventions justified by the following:    Severe lab/radiological/exam abnormalities including:   Severe pain due to rectal cancer  and extensive comorbidities including: DM2  malignancy,    That are currently affecting medical management.   I expect  patient to be hospitalized for 2 midnights requiring inpatient medical care.  Patient is at high risk for adverse outcome (such as loss of life or disability) if not treated.  Indication for inpatient stay as follows:    severe pain requiring acute inpatient management,     Need for  IV fluids  IV pain medications,     Level of care     tele  For 12H     Roosevelt Eimers 10/14/2022, 9:16 PM    [facility]     after 2 AM please page floor coverage  PA If 7AM-7PM, please contact the day team taking care of the patient using [email or web address]

## 2022-10-14 NOTE — Subjective & Objective (Signed)
Pt with rectal carcinoma and mets to the bone, he has been seen in ER on 8/30 for leg pain in his right leg US showed   Radiologist reports multiple lymph nodes right lower extremity and diffuse soft tissue edema of right scrotum.  the pain has been severe and he came back to ER  No saddle anesthesia no loss of bowel or bladder control  s history of previous pathologic fracture of the left femoral head with total left hip replacement. Now with pain radiating to his left leg too Pain was uncontrolled in ER

## 2022-10-14 NOTE — Assessment & Plan Note (Signed)
Obtain anemia panel hip transfuse as needed for hemoglobin below 7

## 2022-10-14 NOTE — Assessment & Plan Note (Signed)
Order sliding scale and monitor

## 2022-10-15 ENCOUNTER — Telehealth: Payer: Self-pay | Admitting: *Deleted

## 2022-10-15 ENCOUNTER — Encounter: Payer: Self-pay | Admitting: Physician Assistant

## 2022-10-15 DIAGNOSIS — G893 Neoplasm related pain (acute) (chronic): Secondary | ICD-10-CM | POA: Diagnosis not present

## 2022-10-15 DIAGNOSIS — I10 Essential (primary) hypertension: Secondary | ICD-10-CM | POA: Diagnosis not present

## 2022-10-15 DIAGNOSIS — E119 Type 2 diabetes mellitus without complications: Secondary | ICD-10-CM | POA: Diagnosis not present

## 2022-10-15 DIAGNOSIS — C7951 Secondary malignant neoplasm of bone: Secondary | ICD-10-CM

## 2022-10-15 DIAGNOSIS — C20 Malignant neoplasm of rectum: Secondary | ICD-10-CM

## 2022-10-15 LAB — CBC
HCT: 29.5 % — ABNORMAL LOW (ref 39.0–52.0)
Hemoglobin: 9.4 g/dL — ABNORMAL LOW (ref 13.0–17.0)
MCH: 29.4 pg (ref 26.0–34.0)
MCHC: 31.9 g/dL (ref 30.0–36.0)
MCV: 92.2 fL (ref 80.0–100.0)
Platelets: 189 10*3/uL (ref 150–400)
RBC: 3.2 MIL/uL — ABNORMAL LOW (ref 4.22–5.81)
RDW: 16.3 % — ABNORMAL HIGH (ref 11.5–15.5)
WBC: 4.2 10*3/uL (ref 4.0–10.5)
nRBC: 0 % (ref 0.0–0.2)

## 2022-10-15 LAB — MAGNESIUM: Magnesium: 1.9 mg/dL (ref 1.7–2.4)

## 2022-10-15 LAB — COMPREHENSIVE METABOLIC PANEL
ALT: 13 U/L (ref 0–44)
AST: 37 U/L (ref 15–41)
Albumin: 3 g/dL — ABNORMAL LOW (ref 3.5–5.0)
Alkaline Phosphatase: 176 U/L — ABNORMAL HIGH (ref 38–126)
Anion gap: 10 (ref 5–15)
BUN: 12 mg/dL (ref 8–23)
CO2: 23 mmol/L (ref 22–32)
Calcium: 8.4 mg/dL — ABNORMAL LOW (ref 8.9–10.3)
Chloride: 102 mmol/L (ref 98–111)
Creatinine, Ser: 0.66 mg/dL (ref 0.61–1.24)
GFR, Estimated: >60 mL/min (ref >60)
Glucose, Bld: 112 mg/dL — ABNORMAL HIGH (ref 70–99)
Potassium: 3.3 mmol/L — ABNORMAL LOW (ref 3.5–5.1)
Sodium: 135 mmol/L (ref 135–145)
Total Bilirubin: 0.9 mg/dL (ref 0.3–1.2)
Total Protein: 6.5 g/dL (ref 6.5–8.1)

## 2022-10-15 LAB — IRON AND TIBC
Iron: 19 ug/dL — ABNORMAL LOW (ref 45–182)
Saturation Ratios: 8 % — ABNORMAL LOW (ref 17.9–39.5)
TIBC: 225 ug/dL — ABNORMAL LOW (ref 250–450)
UIBC: 206 ug/dL

## 2022-10-15 LAB — CBG MONITORING, ED
Glucose-Capillary: 132 mg/dL — ABNORMAL HIGH (ref 70–99)
Glucose-Capillary: 99 mg/dL (ref 70–99)

## 2022-10-15 LAB — HEMOGLOBIN A1C
Hgb A1c MFr Bld: 5.6 % (ref 4.8–5.6)
Mean Plasma Glucose: 114.02 mg/dL

## 2022-10-15 LAB — GLUCOSE, CAPILLARY
Glucose-Capillary: 156 mg/dL — ABNORMAL HIGH (ref 70–99)
Glucose-Capillary: 82 mg/dL (ref 70–99)
Glucose-Capillary: 177 mg/dL — ABNORMAL HIGH (ref 70–99)

## 2022-10-15 LAB — VITAMIN B12: Vitamin B-12: 159 pg/mL — ABNORMAL LOW (ref 180–914)

## 2022-10-15 LAB — FERRITIN: Ferritin: 1325 ng/mL — ABNORMAL HIGH (ref 24–336)

## 2022-10-15 LAB — PHOSPHORUS: Phosphorus: 3.8 mg/dL (ref 2.5–4.6)

## 2022-10-15 LAB — FOLATE: Folate: 39.5 ng/mL (ref >5.9)

## 2022-10-15 LAB — PREALBUMIN: Prealbumin: 13 mg/dL — ABNORMAL LOW (ref 18–38)

## 2022-10-15 MED ORDER — SENNOSIDES-DOCUSATE SODIUM 8.6-50 MG PO TABS
1.0000 | ORAL_TABLET | Freq: Two times a day (BID) | ORAL | Status: DC
  Administered 2022-10-15 – 2022-10-20 (×12): 1 via ORAL
  Filled 2022-10-15 (×12): qty 1

## 2022-10-15 MED ORDER — OXYCODONE HCL ER 15 MG PO T12A
15.0000 mg | EXTENDED_RELEASE_TABLET | Freq: Two times a day (BID) | ORAL | Status: DC
  Administered 2022-10-15 – 2022-10-21 (×14): 15 mg via ORAL
  Filled 2022-10-15 (×14): qty 1

## 2022-10-15 MED ORDER — ENOXAPARIN SODIUM 30 MG/0.3ML IJ SOSY
30.0000 mg | PREFILLED_SYRINGE | INTRAMUSCULAR | Status: DC
Start: 2022-10-15 — End: 2022-10-15

## 2022-10-15 MED ORDER — METHOCARBAMOL 1000 MG/10ML IJ SOLN
500.0000 mg | Freq: Four times a day (QID) | INTRAVENOUS | Status: DC | PRN
  Administered 2022-10-20: 500 mg via INTRAVENOUS
  Filled 2022-10-15: qty 500

## 2022-10-15 MED ORDER — SODIUM CHLORIDE 0.9 % IV SOLN: INTRAVENOUS | Status: AC

## 2022-10-15 MED ORDER — HYDROMORPHONE HCL 1 MG/ML IJ SOLN
1.0000 mg | INTRAMUSCULAR | Status: DC | PRN
  Administered 2022-10-15 – 2022-10-18 (×12): 1 mg via INTRAVENOUS
  Filled 2022-10-15 (×12): qty 1

## 2022-10-15 MED ORDER — CYANOCOBALAMIN 1000 MCG/ML IJ SOLN
1000.0000 ug | Freq: Every day | INTRAMUSCULAR | Status: AC
  Administered 2022-10-15 – 2022-10-21 (×7): 1000 ug via SUBCUTANEOUS
  Filled 2022-10-15 (×7): qty 1

## 2022-10-15 MED ORDER — POLYETHYLENE GLYCOL 3350 17 G PO PACK: 17.0000 g | PACK | Freq: Every day | ORAL | Status: DC | PRN

## 2022-10-15 MED ORDER — ACETAMINOPHEN 650 MG RE SUPP: 650.0000 mg | Freq: Four times a day (QID) | RECTAL | Status: DC | PRN

## 2022-10-15 MED ORDER — BISACODYL 10 MG RE SUPP
10.0000 mg | Freq: Once | RECTAL | Status: DC
  Filled 2022-10-15: qty 1

## 2022-10-15 MED ORDER — GABAPENTIN 300 MG PO CAPS
300.0000 mg | ORAL_CAPSULE | Freq: Every day | ORAL | Status: DC
  Administered 2022-10-15 – 2022-10-20 (×6): 300 mg via ORAL
  Filled 2022-10-15 (×6): qty 1

## 2022-10-15 MED ORDER — ONDANSETRON HCL 4 MG/2ML IJ SOLN: 4.0000 mg | Freq: Four times a day (QID) | INTRAMUSCULAR | Status: DC | PRN

## 2022-10-15 MED ORDER — CHLORHEXIDINE GLUCONATE CLOTH 2 % EX PADS
6.0000 | MEDICATED_PAD | Freq: Every day | CUTANEOUS | Status: DC
  Administered 2022-10-16 – 2022-10-21 (×6): 6 via TOPICAL

## 2022-10-15 MED ORDER — ONDANSETRON HCL 4 MG PO TABS: 4.0000 mg | ORAL_TABLET | Freq: Four times a day (QID) | ORAL | Status: DC | PRN

## 2022-10-15 MED ORDER — OXYCODONE HCL 5 MG PO TABS: 5.0000 mg | ORAL_TABLET | ORAL | Status: DC | PRN

## 2022-10-15 MED ORDER — ENOXAPARIN SODIUM 40 MG/0.4ML IJ SOSY
40.0000 mg | PREFILLED_SYRINGE | INTRAMUSCULAR | Status: DC
  Administered 2022-10-15 – 2022-10-20 (×6): 40 mg via SUBCUTANEOUS
  Filled 2022-10-15 (×6): qty 0.4

## 2022-10-15 MED ORDER — OXYCODONE HCL 5 MG PO TABS
5.0000 mg | ORAL_TABLET | ORAL | Status: DC | PRN
  Administered 2022-10-15 – 2022-10-16 (×2): 10 mg via ORAL
  Filled 2022-10-15 (×2): qty 2

## 2022-10-15 MED ORDER — LIDOCAINE-PRILOCAINE 2.5-2.5 % EX CREA: TOPICAL_CREAM | Freq: Once | CUTANEOUS | Status: DC

## 2022-10-15 MED ORDER — ACETAMINOPHEN 325 MG PO TABS
650.0000 mg | ORAL_TABLET | Freq: Four times a day (QID) | ORAL | Status: DC | PRN
  Administered 2022-10-15: 650 mg via ORAL
  Filled 2022-10-15: qty 2

## 2022-10-15 MED ORDER — POLYETHYLENE GLYCOL 3350 17 G PO PACK
17.0000 g | PACK | Freq: Two times a day (BID) | ORAL | Status: DC
  Administered 2022-10-15 – 2022-10-20 (×7): 17 g via ORAL
  Filled 2022-10-15 (×9): qty 1

## 2022-10-15 MED ORDER — PANTOPRAZOLE SODIUM 20 MG PO TBEC
20.0000 mg | DELAYED_RELEASE_TABLET | Freq: Every day | ORAL | Status: DC
  Administered 2022-10-15 – 2022-10-21 (×7): 20 mg via ORAL
  Filled 2022-10-15 (×7): qty 1

## 2022-10-15 MED ORDER — DOCUSATE SODIUM 100 MG PO CAPS
100.0000 mg | ORAL_CAPSULE | Freq: Two times a day (BID) | ORAL | Status: DC
  Administered 2022-10-15: 100 mg via ORAL
  Filled 2022-10-15: qty 1

## 2022-10-15 MED ORDER — SENNA 8.6 MG PO TABS
1.0000 | ORAL_TABLET | Freq: Two times a day (BID) | ORAL | Status: DC
  Administered 2022-10-15: 8.6 mg via ORAL
  Filled 2022-10-15: qty 1

## 2022-10-15 MED ORDER — POTASSIUM CHLORIDE CRYS ER 20 MEQ PO TBCR
40.0000 meq | EXTENDED_RELEASE_TABLET | Freq: Once | ORAL | Status: AC
  Administered 2022-10-15: 40 meq via ORAL
  Filled 2022-10-15: qty 2

## 2022-10-15 NOTE — Plan of Care (Signed)
Problem: Education: Goal: Ability to describe self-care measures that may prevent or decrease complications (Diabetes Survival Skills Education) will improve 10/15/2022 1933 by Kathryne Eriksson, RN Outcome: Progressing 10/15/2022 1909 by Kathryne Eriksson, RN Outcome: Progressing 10/15/2022 1909 by Kathryne Eriksson, RN Outcome: Progressing Goal: Individualized Educational Video(s) 10/15/2022 1933 by Kathryne Eriksson, RN Outcome: Progressing 10/15/2022 1909 by Kathryne Eriksson, RN Outcome: Progressing 10/15/2022 1909 by Kathryne Eriksson, RN Outcome: Progressing   Problem: Coping: Goal: Ability to adjust to condition or change in health will improve 10/15/2022 1933 by Kathryne Eriksson, RN Outcome: Progressing 10/15/2022 1909 by Kathryne Eriksson, RN Outcome: Progressing 10/15/2022 1909 by Kathryne Eriksson, RN Outcome: Progressing   Problem: Fluid Volume: Goal: Ability to maintain a balanced intake and output will improve 10/15/2022 1933 by Kathryne Eriksson, RN Outcome: Progressing 10/15/2022 1909 by Kathryne Eriksson, RN Outcome: Progressing 10/15/2022 1909 by Kathryne Eriksson, RN Outcome: Progressing   Problem: Health Behavior/Discharge Planning: Goal: Ability to identify and utilize available resources and services will improve 10/15/2022 1933 by Kathryne Eriksson, RN Outcome: Progressing 10/15/2022 1909 by Kathryne Eriksson, RN Outcome: Progressing 10/15/2022 1909 by Kathryne Eriksson, RN Outcome: Progressing Goal: Ability to manage health-related needs will improve 10/15/2022 1933 by Kathryne Eriksson, RN Outcome: Progressing 10/15/2022 1909 by Kathryne Eriksson, RN Outcome: Progressing 10/15/2022 1909 by Kathryne Eriksson, RN Outcome: Progressing   Problem: Metabolic: Goal: Ability to maintain appropriate glucose levels will improve 10/15/2022 1933 by Kathryne Eriksson, RN Outcome: Progressing 10/15/2022 1909 by Kathryne Eriksson, RN Outcome: Progressing 10/15/2022 1909 by Kathryne Eriksson, RN Outcome: Progressing   Problem: Nutritional: Goal:  Maintenance of adequate nutrition will improve 10/15/2022 1933 by Kathryne Eriksson, RN Outcome: Progressing 10/15/2022 1909 by Kathryne Eriksson, RN Outcome: Progressing 10/15/2022 1909 by Kathryne Eriksson, RN Outcome: Progressing Goal: Progress toward achieving an optimal weight will improve 10/15/2022 1933 by Kathryne Eriksson, RN Outcome: Progressing 10/15/2022 1909 by Kathryne Eriksson, RN Outcome: Progressing 10/15/2022 1909 by Kathryne Eriksson, RN Outcome: Progressing   Problem: Skin Integrity: Goal: Risk for impaired skin integrity will decrease 10/15/2022 1933 by Kathryne Eriksson, RN Outcome: Progressing 10/15/2022 1909 by Kathryne Eriksson, RN Outcome: Progressing 10/15/2022 1909 by Kathryne Eriksson, RN Outcome: Progressing   Problem: Tissue Perfusion: Goal: Adequacy of tissue perfusion will improve 10/15/2022 1933 by Kathryne Eriksson, RN Outcome: Progressing 10/15/2022 1909 by Kathryne Eriksson, RN Outcome: Progressing 10/15/2022 1909 by Kathryne Eriksson, RN Outcome: Progressing   Problem: Education: Goal: Knowledge of General Education information will improve Description: Including pain rating scale, medication(s)/side effects and non-pharmacologic comfort measures 10/15/2022 1933 by Kathryne Eriksson, RN Outcome: Progressing 10/15/2022 1909 by Kathryne Eriksson, RN Outcome: Progressing 10/15/2022 1909 by Kathryne Eriksson, RN Outcome: Progressing   Problem: Health Behavior/Discharge Planning: Goal: Ability to manage health-related needs will improve 10/15/2022 1933 by Kathryne Eriksson, RN Outcome: Progressing 10/15/2022 1909 by Kathryne Eriksson, RN Outcome: Progressing 10/15/2022 1909 by Kathryne Eriksson, RN Outcome: Progressing   Problem: Clinical Measurements: Goal: Ability to maintain clinical measurements within normal limits will improve 10/15/2022 1933 by Kathryne Eriksson, RN Outcome: Progressing 10/15/2022 1909 by Kathryne Eriksson, RN Outcome: Progressing 10/15/2022 1909 by Kathryne Eriksson, RN Outcome: Progressing Goal: Will  remain free from infection 10/15/2022 1933 by Kathryne Eriksson, RN Outcome: Progressing 10/15/2022 1909 by Kathryne Eriksson, RN Outcome: Progressing 10/15/2022 1909 by  Kaleeyah Cuffie S, RN Outcome: Progressing Goal: Diagnostic test results will improve 10/15/2022 1933 by Kathryne Eriksson, RN Outcome: Progressing 10/15/2022 1909 by Kathryne Eriksson, RN Outcome: Progressing 10/15/2022 1909 by Kathryne Eriksson, RN Outcome: Progressing Goal: Respiratory complications will improve 10/15/2022 1933 by Kathryne Eriksson, RN Outcome: Progressing 10/15/2022 1909 by Kathryne Eriksson, RN Outcome: Progressing 10/15/2022 1909 by Kathryne Eriksson, RN Outcome: Progressing Goal: Cardiovascular complication will be avoided 10/15/2022 1933 by Kathryne Eriksson, RN Outcome: Progressing 10/15/2022 1909 by Kathryne Eriksson, RN Outcome: Progressing 10/15/2022 1909 by Kathryne Eriksson, RN Outcome: Progressing   Problem: Activity: Goal: Risk for activity intolerance will decrease 10/15/2022 1933 by Kathryne Eriksson, RN Outcome: Progressing 10/15/2022 1909 by Kathryne Eriksson, RN Outcome: Progressing 10/15/2022 1909 by Kathryne Eriksson, RN Outcome: Progressing   Problem: Nutrition: Goal: Adequate nutrition will be maintained 10/15/2022 1933 by Kathryne Eriksson, RN Outcome: Progressing 10/15/2022 1909 by Kathryne Eriksson, RN Outcome: Progressing 10/15/2022 1909 by Kathryne Eriksson, RN Outcome: Progressing   Problem: Coping: Goal: Level of anxiety will decrease 10/15/2022 1933 by Kathryne Eriksson, RN Outcome: Progressing 10/15/2022 1909 by Kathryne Eriksson, RN Outcome: Progressing 10/15/2022 1909 by Kathryne Eriksson, RN Outcome: Progressing   Problem: Elimination: Goal: Will not experience complications related to bowel motility 10/15/2022 1933 by Kathryne Eriksson, RN Outcome: Progressing 10/15/2022 1909 by Kathryne Eriksson, RN Outcome: Progressing 10/15/2022 1909 by Kathryne Eriksson, RN Outcome: Progressing Goal: Will not experience complications related to urinary  retention 10/15/2022 1933 by Kathryne Eriksson, RN Outcome: Progressing 10/15/2022 1909 by Kathryne Eriksson, RN Outcome: Progressing 10/15/2022 1909 by Kathryne Eriksson, RN Outcome: Progressing   Problem: Pain Managment: Goal: General experience of comfort will improve 10/15/2022 1933 by Kathryne Eriksson, RN Outcome: Progressing 10/15/2022 1909 by Kathryne Eriksson, RN Outcome: Progressing 10/15/2022 1909 by Kathryne Eriksson, RN Outcome: Progressing   Problem: Safety: Goal: Ability to remain free from injury will improve 10/15/2022 1933 by Kathryne Eriksson, RN Outcome: Progressing 10/15/2022 1909 by Kathryne Eriksson, RN Outcome: Progressing 10/15/2022 1909 by Kathryne Eriksson, RN Outcome: Progressing   Problem: Skin Integrity: Goal: Risk for impaired skin integrity will decrease 10/15/2022 1933 by Kathryne Eriksson, RN Outcome: Progressing 10/15/2022 1909 by Kathryne Eriksson, RN Outcome: Progressing 10/15/2022 1909 by Kathryne Eriksson, RN Outcome: Progressing

## 2022-10-15 NOTE — Progress Notes (Signed)
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  Patient noted to have been seen in the ED 10/11/2022 for ongoing right lower extremity pain, ultrasound obtained was negative for DVT with multiple lymph nodes in the right lower extremity and diffuse soft tissue edema of the right scrotum.  Due to ongoing pain patient presented back to the ED.  Patient admitted for pain management.   Assessment & Plan:   Principal Problem:   Cancer associated pain Active Problems:   Essential hypertension, benign   Diabetes mellitus without complication (HCC)   Rectal carcinoma (HCC)   Iron deficiency anemia due to chronic blood loss  #1 right lower extremity pain secondary to metastatic rectal cancer with metastasis to the bone/cancer associated pain -It is noted that patient had run out of his long-acting pain medications 3 to 4 days prior to admission and noted to have run out of his oxycodone breakthrough pain meds per admitting physician. -Patient noted to have had a recent lower extremity ultrasound during last ED visit 10/11/2022 which was negative for DVT. -Patient started back on home regimen of OxyContin 15 mg p.o. twice daily, IV Dilaudid for severe breakthrough pain, oxycodone for moderate breakthrough pain. -Continue current pain regimen and if worsening or not controlled may consider palliative care involvement however currently pain is controlled. -??  May need radiation therapy for pain management however will defer to oncology.  2.  Hypokalemia -Replete.   3.  Hypertension -Stable.   4.  Well-controlled diabetes mellitus type  2 -Hemoglobin A1c 5.6 (10/14/2022). -Patient noted not to be on any diabetic medications per med rec. -SSI.  5.  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 -Continue current pain management. -??  Need for radiation therapy for pain management.  6.  Constipation -Likely secondary to opioid pain medication. -Place on MiraLAX twice daily, Senokot-S twice daily. -Dulcolax suppository ordered this morning however it is noted patient refused.  7.  Vitamin B12 deficiency -Vitamin B12 1000 mcg subcu daily x 7 days, and then weekly x 1 month, and then monthly. -Could likely transition to oral vitamin B12 supplementation on discharge.    DVT prophylaxis: Lovenox Code Status: Full Family Communication: Updated patient.  No family at bedside. Disposition: Likely home once pain is managed adequately.  Status is: Inpatient Remains inpatient appropriate because: Severity of illness   Consultants:  Notified primary oncologist: Dr. Leonides Schanz of admission via epic.  Procedures:  CT chest abdomen and pelvis 10/14/2022   Antimicrobials:  Anti-infectives (From admission, onward)    None         Subjective: 9 in bed sleeping easily arousable.  Denies any chest pain or shortness of breath.  No abdominal pain.  States currently right lower extremity leg pain which she presented with is well-controlled on current pain regimen.  Objective: Vitals:   10/15/22 0700 10/15/22 0755 10/15/22 0800 10/15/22 0921  BP: (!) 198/73 119/65 125/66 (!) 156/69  Pulse:  76 74 83  Resp: 18 18  18   Temp:  98.8 F (37.1 C)  98.5 F (36.9 C)  TempSrc:  Oral  Oral  SpO2:  96% 95% 100%  Weight:      Height:        Intake/Output Summary (Last 24 hours) at 10/15/2022 1305 Last data filed at 10/15/2022 0921 Gross per 24 hour  Intake 288.51 ml  Output 1175 ml  Net -886.49 ml   Filed Weights   10/14/22 1527  Weight: 96.2 kg     Examination:  General exam: NAD. Respiratory system: CTAB.  No wheezes, no crackles, no rhonchi.  Fair air movement.  Speaking in full sentences.   Cardiovascular system: Regular rate rhythm no murmurs rubs or gallops.  No JVD.  No lower extremity edema.   Gastrointestinal system: Abdomen is soft, nontender, nondistended, positive bowel sounds.  No rebound.  No guarding.  Central nervous system: Alert and oriented. No focal neurological deficits. Extremities: Symmetric 5 x 5 power. Skin: No rashes, lesions or ulcers Psychiatry: Judgement and insight appear normal. Mood & affect appropriate.     Data Reviewed: I have personally reviewed following labs and imaging studies  CBC: Recent Labs  Lab 10/11/22 1523 10/14/22 1745 10/15/22 0755  WBC 3.8* 4.1 4.2  NEUTROABS 2.8  --   --   HGB 11.3* 10.5* 9.4*  HCT 35.1* 32.3* 29.5*  MCV 94.1 92.3 92.2  PLT 224 188 189    Basic Metabolic Panel: Recent Labs  Lab 10/11/22 1523 10/14/22 1745 10/15/22 0755  NA 139 136 135  K 4.5 3.7 3.3*  CL 102 102 102  CO2 25 22 23   GLUCOSE 158* 128* 112*  BUN 20 15 12   CREATININE 0.74 0.76 0.66  CALCIUM 8.8* 8.6* 8.4*  MG  --  2.0 1.9  PHOS  --  4.0 3.8    GFR: Estimated Creatinine Clearance: 111.9 mL/min (by C-G formula based on SCr of 0.66 mg/dL).  Liver Function Tests: Recent Labs  Lab 10/11/22 1523 10/14/22 1745 10/15/22 0755  AST 27 52* 37  ALT 16 14 13   ALKPHOS 114 177* 176*  BILITOT 0.5 1.1 0.9  PROT 6.4* 6.9 6.5  ALBUMIN 3.5 3.5 3.0*    CBG: Recent Labs  Lab 10/15/22 0208 10/15/22 0746 10/15/22 1112  GLUCAP 132* 99 156*     No results found for this or any previous visit (from the past 240 hour(s)).       Radiology Studies: CT CHEST ABDOMEN PELVIS W CONTRAST  Result Date: 10/14/2022 CLINICAL DATA:  Colorectal carcinoma staging. Bilateral leg pain which is severe. 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 * Tracking Code: BO  * EXAM: CT CHEST, ABDOMEN, AND PELVIS WITH CONTRAST TECHNIQUE: Multidetector CT imaging of the chest, abdomen and pelvis was performed following the standard protocol during bolus administration of intravenous contrast. RADIATION DOSE REDUCTION: This exam was performed according to the departmental dose-optimization program which includes automated exposure control, adjustment of the mA and/or kV according to patient size and/or use of iterative reconstruction technique. CONTRAST:  OMNIPAQUE IOHEXOL 300 MG/ML  SOLN COMPARISON:  CT pelvis 07/23/2022, PET-CT scan 07/11/2022 FINDINGS: CT CHEST FINDINGS CT CHEST FINDINGS Cardiovascular: Port in the anterior chest wall with tip in distal SVC. No significant vascular findings. Normal heart size. No pericardial effusion. Mediastinum/Nodes: No axillary or supraclavicular adenopathy. No mediastinal or hilar adenopathy. No pericardial fluid. Esophagus normal. Lungs/Pleura: No suspicious pulmonary nodules. Normal pleural. Airways normal. Musculoskeletal: Expansile rib lesions hypermetabolic on comparison PET-CT scan. Lesion the anterior LEFT third rib in the anterior RIGHT fourth rib. Findings similar to PET-CT 07/11/2022  CT ABDOMEN AND PELVIS FINDINGS Hepatobiliary: No focal hepatic lesion. Pancreas: Pancreas is normal. No ductal dilatation. No pancreatic inflammation. Spleen: Normal spleen Adrenals/urinary tract: Adrenal glands normal. Simple fluid attenuation cyst in the RIGHT kidney. Right Bosniak I benign renal cyst. No follow-up imaging is recommended. JACR 2018 Feb; 264-273, Management of the Incidental Renal Mass on CT, [employer] 2021; [id number], Bosniak Classification of Cystic Renal Masses, Version 2019. Ureters and bladder normal Stomach/Bowel: Stomach, small bowel, appendix, and cecum are normal. Moderate volume stool throughout the entire colon. Vascular/Lymphatic: Abdominal aorta is normal caliber with atherosclerotic calcification. There is no  retroperitoneal or periportal lymphadenopathy. No pelvic lymphadenopathy. Reproductive: Unremarkable Other: Haziness in the presacral space may relate to prior radiation treatment. Musculoskeletal: Multiple expansile metastatic lesions in the pelvis. These lesions are bulky benign signal change from prior. For example lesion involving the RIGHT inferior ischium measuring 7.5 by 4.1 cm (image 118/3. These lesions are hypermetabolic on comparison PET-CT scan. Extensive involvement of the LEFT pelvis and sacrum. No pathologic fracture. LEFT hip prosthetic. IMPRESSION: 1. No pulmonary metastasis.  No lymphadenopathy 2. Multiple expansile rib lesions again noted. 3. No evidence of bowel obstruction. Moderate volume stool throughout the colon suggest constipation. 4. Expansile metastatic lesions in the LEFT pelvis. These lesions are extremely bulky but not changed from prior. 5. No acute osseous findings. Electronically Signed   By: Genevive Bi M.D.   On: 10/14/2022 19:30   CT L-SPINE NO CHARGE  Result Date: 10/14/2022 CLINICAL DATA:  saw on the 30th at [facility] for the same. Pt here with complaints of bilateral leg pain so severe that he is unable to sleep. Pt tearful in triage and request that he not get a IM shot of Dilaudid again. Colon cancer staging. EXAM: CT LUMBAR SPINE WITHOUT CONTRAST TECHNIQUE: Multidetector CT imaging of the lumbar spine was performed without intravenous contrast administration. Multiplanar CT image reconstructions were also generated. RADIATION DOSE REDUCTION: This exam was performed according to the departmental dose-optimization program which includes automated exposure control, adjustment of the mA and/or kV according to patient size and/or use of iterative reconstruction technique. COMPARISON:  X-ray lumbar spine 07/23/2022 FINDINGS: Segmentation: 5 lumbar type vertebrae with a sacralized L5. Alignment: Normal. Vertebrae: Multilevel mild to moderate degenerative changes of the  spine. Posterior disc osteophyte complex formation at the L4-L5 level. No associated severe osseous neural foraminal or central canal stenosis. No acute fracture. Poorly defined wide zone of transition sclerotic lesions of the sacrum, L5 vertebral body, iliac bones suggestive of osseous metastases in the setting of colon cancer. Question similar finding of the spinous process of the L2 vertebral body. Paraspinal and other soft tissues: Negative. Disc levels: Maintained. IMPRESSION: 1. No acute displaced fracture or traumatic listhesis of the lumbar spine. 2. Lumbosacral and iliac findings suggestive of a sclerotic osseous metastases in the setting of colon cancer. 3. Please see separately dictated CT chest, abdomen, pelvis 10/14/2022. Electronically Signed   By: Tish Frederickson M.D.   On: 10/14/2022 19:17        Scheduled Meds:  bisacodyl  10 mg Rectal Once   gabapentin  300 mg Oral QHS   insulin aspart  0-15 Units Subcutaneous TID WC   insulin aspart  0-5 Units Subcutaneous QHS   lidocaine-prilocaine   Topical Once   oxyCODONE  15 mg Oral Q12H   pantoprazole  20 mg Oral Daily   polyethylene glycol  17 g Oral BID   senna-docusate  1 tablet Oral BID  Continuous Infusions:  methocarbamol (ROBAXIN) IV       LOS: 1 day    Time spent: 35 minutes    Ramiro Harvest, MD [facility]   To contact the attending provider between 7A-7P or the covering provider during after hours 7P-7A, please log into the web site [email or web address] and access using universal [place] password for that web site. If you do not have the password, please call the hospital operator.  10/15/2022, 1:05 PM

## 2022-10-15 NOTE — Progress Notes (Deleted)
Palliative Medicine [facility]  [phone] [phone]   Name: Joseph Hogan Date: 10/15/2022 MRN: [id number]  DOB: 1960-05-21  Patient Care Team: [name], MD as PCP - General (Hematology and Oncology) Doreatha Massed, MD as Medical Oncologist (Medical Oncology)    REASON FOR CONSULTATION: Joseph Hogan is a 62 y.o. 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  Palliative ask to see for symptom management and goals of care.    SOCIAL HISTORY:     reports that he has never smoked. He has never used smokeless tobacco. He reports that he does not drink alcohol and does not use drugs.  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   with abnormal R wave progression noted on EKG   MRSA (methicillin resistant Staphylococcus aureus)    Pre-diabetes    Rectal carcinoma (HCC) 03/19/2022    PAST SURGICAL HISTORY:  Past Surgical History:  Procedure Laterality Date   COLONOSCOPY     HUMERUS IM NAIL Right 04/12/2022   Procedure: INTRAMEDULLARY (IM) NAIL HUMERAL;  Surgeon: Bjorn Pippin, MD;  Location: [facility];  Service: Orthopedics;  Laterality: Right;   IR IMAGING GUIDED PORT INSERTION  04/05/2022   TOTAL HIP ARTHROPLASTY Left 02/06/2022   Procedure: TOTAL HIP ARTHROPLASTY;  Surgeon: Joen Laura, MD;  Location: [facility];  Service: Orthopedics;  Laterality: Left;    HEMATOLOGY/ONCOLOGY HISTORY:  Oncology History  Rectal carcinoma (HCC)  03/19/2022 Initial Diagnosis   Rectal carcinoma (HCC)   03/19/2022 Cancer Staging   Staging form: Colon and Rectum, AJCC 8th Edition - Clinical stage from 03/19/2022: Stage IVB (cTX, cN2b,  cM1b) - Signed by [name], MD on 05/21/2022 Stage prefix: Initial diagnosis   04/08/2022 - 07/04/2022 Chemotherapy   Patient is on Treatment Plan : COLORECTAL FOLFOX + Bevacizumab q14d     07/31/2022 -  Chemotherapy   Patient is on Treatment Plan : COLORECTAL FOLFIRI + Panitumumab q14d       ALLERGIES:  is allergic to codeine and lisinopril.  MEDICATIONS:  No current facility-administered medications for this visit.   No current outpatient medications on file.   Facility-Administered Medications Ordered in Other Visits  Medication Dose Route Frequency Provider Last Rate Last Admin   0.9 %  sodium chloride infusion   Intravenous Continuous Doutova, Anastassia, MD 75 mL/hr at 10/15/22 0655 Infusion Verify at 10/15/22 1478   acetaminophen (TYLENOL) tablet 650 mg  650 mg Oral Q6H PRN Therisa Doyne, MD       Or   acetaminophen (TYLENOL) suppository 650 mg  650 mg Rectal Q6H PRN Doutova, Anastassia, MD       bisacodyl (DULCOLAX) suppository 10 mg  10 mg Rectal Once Rodolph Bong, MD       gabapentin (NEURONTIN) capsule 300 mg  300 mg Oral QHS Rodolph Bong, MD       HYDROmorphone (DILAUDID) injection 1 mg  1 mg Intravenous Q3H PRN Rodolph Bong, MD       insulin aspart (novoLOG) injection 0-15 Units  0-15 Units Subcutaneous TID WC Doutova, Anastassia, MD       insulin aspart (novoLOG) injection  0-5 Units  0-5 Units Subcutaneous QHS Doutova, Anastassia, MD       lidocaine-prilocaine (EMLA) cream   Topical Once Doutova, Anastassia, MD       methocarbamol (ROBAXIN) 500 mg in dextrose 5 % 50 mL IVPB  500 mg Intravenous Q6H PRN Doutova, Anastassia, MD       ondansetron (ZOFRAN) tablet 4 mg  4 mg Oral Q6H PRN Doutova, Anastassia, MD       Or   ondansetron (ZOFRAN) injection 4 mg  4 mg Intravenous Q6H PRN Doutova, Anastassia, MD       oxyCODONE (Oxy IR/ROXICODONE) immediate release tablet 5-10 mg  5-10 mg Oral Q4H PRN Rodolph Bong, MD       oxyCODONE (OXYCONTIN)  12 hr tablet 15 mg  15 mg Oral Q12H Doutova, Anastassia, MD   15 mg at 10/15/22 0253   pantoprazole (PROTONIX) EC tablet 20 mg  20 mg Oral Daily Doutova, Anastassia, MD       polyethylene glycol (MIRALAX / GLYCOLAX) packet 17 g  17 g Oral Daily PRN Doutova, Anastassia, MD       polyethylene glycol (MIRALAX / GLYCOLAX) packet 17 g  17 g Oral BID Rodolph Bong, MD       potassium chloride SA (KLOR-CON M) CR tablet 40 mEq  40 mEq Oral Once Rodolph Bong, MD       senna-docusate (Senokot-S) tablet 1 tablet  1 tablet Oral BID Rodolph Bong, MD        VITAL SIGNS: There were no vitals taken for this visit. There were no vitals filed for this visit.  Estimated body mass index is 29.57 kg/m as calculated from the following:   Height as of 10/14/22: 5\' 11"  (1.803 m).   Weight as of 10/14/22: 212 lb (96.2 kg).  LABS: CBC:    Component Value Date/Time   WBC 4.2 10/15/2022 0755   HGB 9.4 (L) 10/15/2022 0755   HGB 11.2 (L) 10/02/2022 0947   HCT 29.5 (L) 10/15/2022 0755   PLT 189 10/15/2022 0755   PLT 232 10/02/2022 0947   MCV 92.2 10/15/2022 0755   NEUTROABS 2.8 10/11/2022 1523   LYMPHSABS 0.4 (L) 10/11/2022 1523   MONOABS 0.3 10/11/2022 1523   EOSABS 0.2 10/11/2022 1523   BASOSABS 0.0 10/11/2022 1523   Comprehensive Metabolic Panel:    Component Value Date/Time   NA 135 10/15/2022 0755   K 3.3 (L) 10/15/2022 0755   CL 102 10/15/2022 0755   CO2 23 10/15/2022 0755   BUN 12 10/15/2022 0755   CREATININE 0.66 10/15/2022 0755   CREATININE 0.46 (L) 10/02/2022 0947   CREATININE 0.77 06/04/2016 1259   GLUCOSE 112 (H) 10/15/2022 0755   CALCIUM 8.4 (L) 10/15/2022 0755   AST 37 10/15/2022 0755   AST 27 10/02/2022 0947   ALT 13 10/15/2022 0755   ALT 15 10/02/2022 0947   ALKPHOS 176 (H) 10/15/2022 0755   BILITOT 0.9 10/15/2022 0755   BILITOT 0.6 10/02/2022 0947   PROT 6.5 10/15/2022 0755   ALBUMIN 3.0 (L) 10/15/2022 0755    RADIOGRAPHIC STUDIES: CT CHEST ABDOMEN PELVIS W  CONTRAST  Result Date: 10/14/2022 CLINICAL DATA:  Colorectal carcinoma staging. Bilateral leg pain which is severe. 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 * Tracking Code: BO * EXAM: CT CHEST, ABDOMEN, AND PELVIS WITH CONTRAST TECHNIQUE: Multidetector CT imaging of the chest, abdomen and pelvis was performed following the standard protocol during bolus administration of intravenous contrast. RADIATION  DOSE REDUCTION: This exam was performed according to the departmental dose-optimization program which includes automated exposure control, adjustment of the mA and/or kV according to patient size and/or use of iterative reconstruction technique. CONTRAST:  OMNIPAQUE IOHEXOL 300 MG/ML  SOLN COMPARISON:  CT pelvis 07/23/2022, PET-CT scan 07/11/2022 FINDINGS: CT CHEST FINDINGS CT CHEST FINDINGS Cardiovascular: Port in the anterior chest wall with tip in distal SVC. No significant vascular findings. Normal heart size. No pericardial effusion. Mediastinum/Nodes: No axillary or supraclavicular adenopathy. No mediastinal or hilar adenopathy. No pericardial fluid. Esophagus normal. Lungs/Pleura: No suspicious pulmonary nodules. Normal pleural. Airways normal. Musculoskeletal: Expansile rib lesions hypermetabolic on comparison PET-CT scan. Lesion the anterior LEFT third rib in the anterior RIGHT fourth rib. Findings similar to PET-CT 07/11/2022 CT ABDOMEN AND PELVIS FINDINGS Hepatobiliary: No focal hepatic lesion. Pancreas: Pancreas is normal. No ductal dilatation. No pancreatic inflammation. Spleen: Normal spleen Adrenals/urinary tract: Adrenal glands normal. Simple fluid attenuation cyst in the RIGHT kidney. Right Bosniak I benign renal cyst. No follow-up imaging is recommended. JACR 2018 Feb; 264-273, Management of the Incidental Renal Mass on CT, [employer] 2021; [id number], Bosniak Classification of Cystic Renal Masses, Version 2019. Ureters and bladder normal Stomach/Bowel: Stomach,  small bowel, appendix, and cecum are normal. Moderate volume stool throughout the entire colon. Vascular/Lymphatic: Abdominal aorta is normal caliber with atherosclerotic calcification. There is no retroperitoneal or periportal lymphadenopathy. No pelvic lymphadenopathy. Reproductive: Unremarkable Other: Haziness in the presacral space may relate to prior radiation treatment. Musculoskeletal: Multiple expansile metastatic lesions in the pelvis. These lesions are bulky benign signal change from prior. For example lesion involving the RIGHT inferior ischium measuring 7.5 by 4.1 cm (image 118/3. These lesions are hypermetabolic on comparison PET-CT scan. Extensive involvement of the LEFT pelvis and sacrum. No pathologic fracture. LEFT hip prosthetic. IMPRESSION: 1. No pulmonary metastasis.  No lymphadenopathy 2. Multiple expansile rib lesions again noted. 3. No evidence of bowel obstruction. Moderate volume stool throughout the colon suggest constipation. 4. Expansile metastatic lesions in the LEFT pelvis. These lesions are extremely bulky but not changed from prior. 5. No acute osseous findings. Electronically Signed   By: Genevive Bi M.D.   On: 10/14/2022 19:30   CT L-SPINE NO CHARGE  Result Date: 10/14/2022 CLINICAL DATA:  saw on the 30th at [facility] for the same. Pt here with complaints of bilateral leg pain so severe that he is unable to sleep. Pt tearful in triage and request that he not get a IM shot of Dilaudid again. Colon cancer staging. EXAM: CT LUMBAR SPINE WITHOUT CONTRAST TECHNIQUE: Multidetector CT imaging of the lumbar spine was performed without intravenous contrast administration. Multiplanar CT image reconstructions were also generated. RADIATION DOSE REDUCTION: This exam was performed according to the departmental dose-optimization program which includes automated exposure control, adjustment of the mA and/or kV according to patient size and/or use of iterative reconstruction technique.  COMPARISON:  X-ray lumbar spine 07/23/2022 FINDINGS: Segmentation: 5 lumbar type vertebrae with a sacralized L5. Alignment: Normal. Vertebrae: Multilevel mild to moderate degenerative changes of the spine. Posterior disc osteophyte complex formation at the L4-L5 level. No associated severe osseous neural foraminal or central canal stenosis. No acute fracture. Poorly defined wide zone of transition sclerotic lesions of the sacrum, L5 vertebral body, iliac bones suggestive of osseous metastases in the setting of colon cancer. Question similar finding of the spinous process of the L2 vertebral body. Paraspinal and other soft tissues: Negative. Disc levels: Maintained. IMPRESSION: 1. No acute displaced fracture or traumatic listhesis  of the lumbar spine. 2. Lumbosacral and iliac findings suggestive of a sclerotic osseous metastases in the setting of colon cancer. 3. Please see separately dictated CT chest, abdomen, pelvis 10/14/2022. Electronically Signed   By: Tish Frederickson M.D.   On: 10/14/2022 19:17   PERFORMANCE STATUS (ECOG) : {CHL ONC ECOG AO:[id number]}  Review of Systems Unless otherwise noted, a complete review of systems is negative.  Physical Exam General: NAD Cardiovascular: regular rate and rhythm Pulmonary: clear ant fields Abdomen: soft, nontender, + bowel sounds Extremities: no edema, no joint deformities Skin: no rashes Neurological:  IMPRESSION: *** I introduced myself, Lacorey Brusca RN, and Palliative's role in collaboration with the oncology team. Concept of Palliative Care was introduced as specialized medical care for people and their families living with serious illness.  It focuses on providing relief from the symptoms and stress of a serious illness.  The goal is to improve quality of life for both the patient and the family. Values and goals of care important to patient and family were attempted to be elicited.    We discussed *** current illness and what it means in the larger  context of *** on-going co-morbidities. Natural disease trajectory and expectations were discussed.  I discussed the importance of continued conversation with family and their medical providers regarding overall plan of care and treatment options, ensuring decisions are within the context of the patients values and GOCs.  PLAN: Established therapeutic relationship. Education provided on palliative's role in collaboration with their Oncology/Radiation team. I will plan to see patient back in 2-4 weeks in collaboration to other oncology appointments.    Patient expressed understanding and was in agreement with this plan. He also understands that He can call the clinic at any time with any questions, concerns, or complaints.   Thank you for your referral and allowing Palliative to assist in Mr. Lubertha Basque Coffel's care.   Number and complexity of problems addressed: ***HIGH - 1 or more chronic illnesses with SEVERE exacerbation, progression, or side effects of treatment - advanced cancer, pain. Any controlled substances utilized were prescribed in the context of palliative care.   Visit consisted of counseling and education dealing with the complex and emotionally intense issues of symptom management and palliative care in the setting of serious and potentially life-threatening illness.Greater than 50%  of this time was spent counseling and coordinating care related to the above assessment and plan.  Signed by: Willette Alma, AGPCNP-BC Palliative Medicine Team/[facility]   *Please note that this is a verbal dictation therefore any spelling or grammatical errors are due to the "[employer]agon [employer] One" system interpretation.

## 2022-10-15 NOTE — Telephone Encounter (Signed)
Patient called yesterday to report that he was going to the ER again for the reoccurring uncontrollable pain in his leg.    Patient is presently at [facility] ER.  Routed to providers to make aware.

## 2022-10-15 NOTE — Plan of Care (Signed)

## 2022-10-15 NOTE — ED Notes (Signed)
ED TO INPATIENT HANDOFF REPORT  ED Nurse Name and Phone #: Huntley Dec [phone]  S Name/Age/Gender Joseph Hogan 62 y.o. male Room/Bed: WA22/WA22  Code Status   Code Status: Full Code  Home/SNF/Other Patient oriented to: self, place, time, and situation Is this baseline? 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 Pt here with complaints of bilateral leg pain so severe that he is unable to sleep. Pt tearful in triage and request that he not get a IM shot of Dilaudid again due to having had it at [facility] and he states it made him nauseous. Pt states he is currently receiving chemotherapy for colon cancer.   Allergies Allergies  Allergen Reactions   Codeine Nausea And Vomiting   Lisinopril Cough    Level of Care/Admitting Diagnosis ED Disposition     ED Disposition  Admit   Condition  --   Comment  Hospital Area: [facility] [[id number]]  Level of Care: Telemetry [5]  Admit to tele based on following criteria: Other see comments  Comments: need high dose narcotics  May admit patient to [facility] or [facility] if equivalent level of care is available:: No  Covid Evaluation: Asymptomatic - no recent exposure (last 10 days) testing not required  Diagnosis: Cancer associated pain [309085]  Admitting Physician: Therisa Doyne [[id number]]  Attending Physician: Therisa Doyne [[id number]]  Certification:: I certify this patient will need inpatient services for at least 2 midnights  Expected Medical Readiness: 10/16/2022          B Medical/Surgery History 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   with abnormal R wave progression noted on EKG   MRSA (methicillin resistant Staphylococcus aureus)    Pre-diabetes    Rectal carcinoma (HCC) 03/19/2022    Past Surgical History:  Procedure Laterality Date   COLONOSCOPY     HUMERUS IM NAIL Right 04/12/2022   Procedure: INTRAMEDULLARY (IM) NAIL HUMERAL;  Surgeon: Bjorn Pippin, MD;  Location: [facility];  Service: Orthopedics;  Laterality: Right;   IR IMAGING GUIDED PORT INSERTION  04/05/2022   TOTAL HIP ARTHROPLASTY Left 02/06/2022   Procedure: TOTAL HIP ARTHROPLASTY;  Surgeon: Joen Laura, MD;  Location: [facility];  Service: Orthopedics;  Laterality: Left;     A IV Location/Drains/Wounds Patient Lines/Drains/Airways Status     Active Line/Drains/Airways     Name Placement date Placement time Site Days   Implanted Port 04/05/22 Right Chest 04/05/22  1347  Chest  193   Peripheral IV 10/14/22 20 G 1.25" Left Antecubital 10/14/22  1748  Antecubital  1            Intake/Output Last 24 hours  Intake/Output Summary (Last 24 hours) at 10/15/2022 0757 Last data filed at 10/15/2022 1478 Gross per 24 hour  Intake 288.51 ml  Output 775 ml  Net -486.49 ml    Labs/Imaging Results for orders placed or performed during the hospital encounter of 10/14/22 (from the past 48 hour(s))  CBC     Status: Abnormal   Collection Time: 10/14/22  5:45 PM  Result Value Ref Range   WBC 4.1 4.0 - 10.5 K/uL   RBC 3.50 (L) 4.22 - 5.81 MIL/uL   Hemoglobin 10.5 (L) 13.0 - 17.0 g/dL  HCT 32.3 (L) 39.0 - 52.0 %   MCV 92.3 80.0 - 100.0 fL   MCH 30.0 26.0 - 34.0 pg   MCHC 32.5 30.0 - 36.0 g/dL   RDW 16.1 (H) 09.6 - 04.5 %   Platelets 188 150 - 400 K/uL   nRBC 0.0 0.0 - 0.2 %    Comment: Performed at [facility], [address] [place], [place] [address]  Comprehensive metabolic panel     Status: Abnormal   Collection Time: 10/14/22  5:45 PM  Result Value Ref Range   Sodium 136 135 - 145 mmol/L   Potassium 3.7 3.5 - 5.1 mmol/L   Chloride 102 98 - 111 mmol/L   CO2 22 22 - 32 mmol/L   Glucose, Bld 128 (H) 70 - 99 mg/dL    Comment: Glucose reference range applies only to samples  taken after fasting for at least 8 hours.   BUN 15 8 - 23 mg/dL   Creatinine, Ser 1.91 0.61 - 1.24 mg/dL   Calcium 8.6 (L) 8.9 - 10.3 mg/dL   Total Protein 6.9 6.5 - 8.1 g/dL   Albumin 3.5 3.5 - 5.0 g/dL   AST 52 (H) 15 - 41 U/L   ALT 14 0 - 44 U/L   Alkaline Phosphatase 177 (H) 38 - 126 U/L   Total Bilirubin 1.1 0.3 - 1.2 mg/dL   GFR, Estimated >47 >82 mL/min    Comment: (NOTE) Calculated using the CKD-EPI Creatinine Equation (2021)    Anion gap 12 5 - 15    Comment: Performed at [facility], [address] [place], [place] [address]  Magnesium     Status: None   Collection Time: 10/14/22  5:45 PM  Result Value Ref Range   Magnesium 2.0 1.7 - 2.4 mg/dL    Comment: Performed at [facility], [address] [place], [place] [address]  Phosphorus     Status: None   Collection Time: 10/14/22  5:45 PM  Result Value Ref Range   Phosphorus 4.0 2.5 - 4.6 mg/dL    Comment: Performed at [facility], [address], [place], [place] [address]  CK     Status: None   Collection Time: 10/14/22  5:45 PM  Result Value Ref Range   Total CK 182 49 - 397 U/L    Comment: Performed at [facility], [address] [place], [place] [address]  Reticulocytes     Status: Abnormal   Collection Time: 10/14/22  5:45 PM  Result Value Ref Range   Retic Ct Pct 2.4 0.4 - 3.1 %   RBC. 3.47 (L) 4.22 - 5.81 MIL/uL   Retic Count, Absolute 84.3 19.0 - 186.0 K/uL   Immature Retic Fract 13.2 2.3 - 15.9 %    Comment: Performed at [facility], [address] [place], [place] [address]  Hemoglobin A1c     Status: None   Collection Time: 10/14/22  5:45 PM  Result Value Ref Range   Hgb A1c MFr Bld 5.6 4.8 - 5.6 %    Comment: (NOTE) Pre diabetes:          5.7%-6.4%  Diabetes:              >6.4%  Glycemic control for   <7.0% adults with diabetes    Mean Plasma Glucose 114.02 mg/dL    Comment: Performed at  [facility], [address] [place], [place] [address]  Urinalysis, Routine w reflex microscopic -Urine, Clean Catch     Status: Abnormal   Collection Time: 10/14/22  7:13 PM  Result Value Ref Range   Color, Urine YELLOW YELLOW   APPearance CLEAR CLEAR   Specific Gravity, Urine 1.040 (H) 1.005 - 1.030   pH 5.0 5.0 - 8.0   Glucose, UA NEGATIVE NEGATIVE mg/dL   Hgb urine dipstick NEGATIVE NEGATIVE   Bilirubin Urine NEGATIVE NEGATIVE   Ketones, ur 20 (A) NEGATIVE mg/dL   Protein, ur NEGATIVE NEGATIVE mg/dL   Nitrite NEGATIVE NEGATIVE   Leukocytes,Ua NEGATIVE NEGATIVE    Comment: Performed at [facility], [address], [place], [place] [address]  CBG monitoring, ED     Status: Abnormal   Collection Time: 10/15/22  2:08 AM  Result Value Ref Range   Glucose-Capillary 132 (H) 70 - 99 mg/dL    Comment: Glucose reference range applies only to samples taken after fasting for at least 8 hours.  CBG monitoring, ED     Status: None   Collection Time: 10/15/22  7:46 AM  Result Value Ref Range   Glucose-Capillary 99 70 - 99 mg/dL    Comment: Glucose reference range applies only to samples taken after fasting for at least 8 hours.   CT CHEST ABDOMEN PELVIS W CONTRAST  Result Date: 10/14/2022 CLINICAL DATA:  Colorectal carcinoma staging. Bilateral leg pain which is severe. 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 * Tracking Code: BO * EXAM: CT CHEST, ABDOMEN, AND PELVIS WITH CONTRAST TECHNIQUE: Multidetector CT imaging of the chest, abdomen and pelvis was performed following the standard protocol during bolus administration of intravenous contrast. RADIATION DOSE REDUCTION: This exam was performed according to the departmental dose-optimization program which includes automated exposure control, adjustment of the mA and/or kV according to patient size and/or use of iterative reconstruction technique. CONTRAST:  OMNIPAQUE IOHEXOL 300 MG/ML   SOLN COMPARISON:  CT pelvis 07/23/2022, PET-CT scan 07/11/2022 FINDINGS: CT CHEST FINDINGS CT CHEST FINDINGS Cardiovascular: Port in the anterior chest wall with tip in distal SVC. No significant vascular findings. Normal heart size. No pericardial effusion. Mediastinum/Nodes: No axillary or supraclavicular adenopathy. No mediastinal or hilar adenopathy. No pericardial fluid. Esophagus normal. Lungs/Pleura: No suspicious pulmonary nodules. Normal pleural. Airways normal. Musculoskeletal: Expansile rib lesions hypermetabolic on comparison PET-CT scan. Lesion the anterior LEFT third rib in the anterior RIGHT fourth rib. Findings similar to PET-CT 07/11/2022 CT ABDOMEN AND PELVIS FINDINGS Hepatobiliary: No focal hepatic lesion. Pancreas: Pancreas is normal. No ductal dilatation. No pancreatic inflammation. Spleen: Normal spleen Adrenals/urinary tract: Adrenal glands normal. Simple fluid attenuation cyst in the RIGHT kidney. Right Bosniak I benign renal cyst. No follow-up imaging is recommended. JACR 2018 Feb; 264-273, Management of the Incidental Renal Mass on CT, [employer] 2021; 814-848, Bosniak Classification of Cystic Renal Masses, Version 2019. Ureters and bladder normal Stomach/Bowel: Stomach, small bowel, appendix, and cecum are normal. Moderate volume stool throughout the entire colon. Vascular/Lymphatic: Abdominal aorta is normal caliber with atherosclerotic calcification. There is no retroperitoneal or periportal lymphadenopathy. No pelvic lymphadenopathy. Reproductive: Unremarkable Other: Haziness in the presacral space may relate to prior radiation treatment. Musculoskeletal: Multiple expansile metastatic lesions in the pelvis. These lesions are bulky benign signal change from prior. For example lesion involving the RIGHT inferior ischium measuring 7.5 by 4.1 cm (image 118/3. These lesions are hypermetabolic on comparison PET-CT scan. Extensive involvement of the LEFT pelvis and sacrum. No pathologic  fracture. LEFT hip prosthetic. IMPRESSION: 1. No pulmonary metastasis.  No lymphadenopathy 2. Multiple expansile rib lesions again noted. 3. No evidence of bowel obstruction. Moderate volume stool throughout the colon suggest constipation. 4. Expansile metastatic lesions in the LEFT pelvis. These lesions are extremely bulky but not changed from prior. 5. No acute osseous findings. Electronically Signed   By: Genevive Bi M.D.   On: 10/14/2022 19:30   CT L-SPINE NO CHARGE  Result Date: 10/14/2022 CLINICAL DATA:  saw on the 30th at [facility] for the same. Pt here with complaints of bilateral leg pain so severe that he is unable to sleep. Pt tearful in triage and request that he not get a IM shot of Dilaudid again. Colon cancer staging. EXAM: CT LUMBAR SPINE WITHOUT CONTRAST TECHNIQUE: Multidetector CT imaging of the lumbar spine was performed without intravenous contrast administration. Multiplanar CT image reconstructions were also generated. RADIATION DOSE REDUCTION: This exam was performed according to the departmental dose-optimization program which includes automated exposure control, adjustment of the mA and/or kV according to patient size and/or use of iterative reconstruction technique. COMPARISON:  X-ray lumbar spine 07/23/2022 FINDINGS: Segmentation: 5 lumbar type vertebrae with a sacralized L5. Alignment: Normal. Vertebrae: Multilevel mild to moderate degenerative changes of the spine. Posterior disc osteophyte complex formation at the L4-L5 level. No associated severe osseous neural foraminal or central canal stenosis. No acute fracture. Poorly defined wide zone of transition sclerotic lesions of the sacrum, L5 vertebral body, iliac bones suggestive of osseous metastases in the setting of colon cancer. Question similar finding of the spinous process of the L2 vertebral body. Paraspinal and other soft tissues: Negative. Disc levels: Maintained. IMPRESSION: 1. No acute displaced fracture or traumatic  listhesis of the lumbar spine. 2. Lumbosacral and iliac findings suggestive of a sclerotic osseous metastases in the setting of colon cancer. 3. Please see separately dictated CT chest, abdomen, pelvis 10/14/2022. Electronically Signed   By: Tish Frederickson M.D.   On: 10/14/2022 19:17    Pending Labs Unresulted Labs (From admission, onward)     Start     Ordered   10/15/22 0500  Prealbumin  Tomorrow morning,   R        10/14/22 2051   10/15/22 0500  Vitamin B12  (Anemia Panel (PNL))  Tomorrow morning,   R        10/14/22 2051   10/15/22 0500  Folate  (Anemia Panel (PNL))  Tomorrow morning,   R        10/14/22 2051   10/15/22 0500  Magnesium  Tomorrow morning,   R        10/15/22 0143   10/15/22 0500  Phosphorus  Tomorrow morning,   R        10/15/22 0143   10/15/22 0500  Comprehensive metabolic panel  Tomorrow morning,   R       Question:  Release to patient  Answer:  Immediate   10/15/22 0143   10/15/22 0500  CBC  Tomorrow morning,   R       Question:  Release to patient  Answer:  Immediate   10/15/22 0143   10/14/22 2052  Iron and TIBC  (Anemia Panel (PNL))  Add-on,   AD        10/14/22 2051   10/14/22 2052  Ferritin  (Anemia Panel (PNL))  Add-on,   AD        10/14/22 2051            Vitals/Pain Today's Vitals   10/15/22 0500 10/15/22 0600 10/15/22  0700 10/15/22 0755  BP: (!) 158/80 (!) 165/76 (!) 198/73 119/65  Pulse:    76  Resp: (!) 34  18 18  Temp:    98.8 F (37.1 C)  TempSrc:    Oral  SpO2:    96%  Weight:      Height:      PainSc:        Isolation Precautions No active isolations  Medications Medications  HYDROmorphone (DILAUDID) injection 0.5-1 mg (1 mg Intravenous Given 10/15/22 0753)  insulin aspart (novoLOG) injection 0-5 Units ( Subcutaneous Not Given 10/15/22 0237)  insulin aspart (novoLOG) injection 0-15 Units ( Subcutaneous Not Given 10/15/22 0752)  lidocaine-prilocaine (EMLA) cream (0 Applications Topical Hold 10/15/22 0201)  pantoprazole (PROTONIX)  EC tablet 20 mg (has no administration in time range)  oxyCODONE (OXYCONTIN) 12 hr tablet 15 mg (15 mg Oral Given 10/15/22 0253)  oxyCODONE (Oxy IR/ROXICODONE) immediate release tablet 5 mg (has no administration in time range)  0.9 %  sodium chloride infusion ( Intravenous Infusion Verify 10/15/22 0655)  acetaminophen (TYLENOL) tablet 650 mg (has no administration in time range)    Or  acetaminophen (TYLENOL) suppository 650 mg (has no administration in time range)  ondansetron (ZOFRAN) tablet 4 mg (has no administration in time range)    Or  ondansetron (ZOFRAN) injection 4 mg (has no administration in time range)  methocarbamol (ROBAXIN) 500 mg in dextrose 5 % 50 mL IVPB (has no administration in time range)  docusate sodium (COLACE) capsule 100 mg (100 mg Oral Given 10/15/22 0253)  senna (SENOKOT) tablet 8.6 mg (8.6 mg Oral Given 10/15/22 0253)  polyethylene glycol (MIRALAX / GLYCOLAX) packet 17 g (has no administration in time range)  sodium chloride 0.9 % bolus 1,000 mL (0 mLs Intravenous Stopped 10/14/22 1914)  fentaNYL (SUBLIMAZE) injection 50 mcg (50 mcg Intravenous Given 10/14/22 1753)  ondansetron (ZOFRAN) injection 4 mg (4 mg Intravenous Given 10/14/22 1753)  iohexol (OMNIPAQUE) 300 MG/ML solution 100 mL (100 mLs Intravenous Contrast Given 10/14/22 1830)  HYDROmorphone (DILAUDID) injection 0.5 mg (0.5 mg Intravenous Given 10/14/22 2040)    Mobility walks with device     Focused Assessments  R Recommendations: See Admitting Provider Note  Report given to:   Additional Notes:

## 2022-10-16 ENCOUNTER — Inpatient Hospital Stay: Payer: Medicare HMO

## 2022-10-16 ENCOUNTER — Inpatient Hospital Stay: Payer: Medicare HMO | Admitting: Physician Assistant

## 2022-10-16 DIAGNOSIS — C20 Malignant neoplasm of rectum: Secondary | ICD-10-CM | POA: Diagnosis not present

## 2022-10-16 DIAGNOSIS — I10 Essential (primary) hypertension: Secondary | ICD-10-CM | POA: Diagnosis not present

## 2022-10-16 DIAGNOSIS — G893 Neoplasm related pain (acute) (chronic): Secondary | ICD-10-CM | POA: Diagnosis not present

## 2022-10-16 DIAGNOSIS — E119 Type 2 diabetes mellitus without complications: Secondary | ICD-10-CM | POA: Diagnosis not present

## 2022-10-16 LAB — CBC WITH DIFFERENTIAL/PLATELET
Abs Immature Granulocytes: 0.02 10*3/uL (ref 0.00–0.07)
Basophils Absolute: 0 10*3/uL (ref 0.0–0.1)
Basophils Relative: 1 %
Eosinophils Absolute: 0.1 10*3/uL (ref 0.0–0.5)
Eosinophils Relative: 1 %
HCT: 28 % — ABNORMAL LOW (ref 39.0–52.0)
Hemoglobin: 9 g/dL — ABNORMAL LOW (ref 13.0–17.0)
Immature Granulocytes: 1 %
Lymphocytes Relative: 9 %
Lymphs Abs: 0.4 10*3/uL — ABNORMAL LOW (ref 0.7–4.0)
MCH: 29.5 pg (ref 26.0–34.0)
MCHC: 32.1 g/dL (ref 30.0–36.0)
MCV: 91.8 fL (ref 80.0–100.0)
Monocytes Absolute: 0.7 10*3/uL (ref 0.1–1.0)
Monocytes Relative: 16 %
Neutro Abs: 3.2 10*3/uL (ref 1.7–7.7)
Neutrophils Relative %: 72 %
Platelets: 172 10*3/uL (ref 150–400)
RBC: 3.05 MIL/uL — ABNORMAL LOW (ref 4.22–5.81)
RDW: 16.3 % — ABNORMAL HIGH (ref 11.5–15.5)
WBC: 4.3 10*3/uL (ref 4.0–10.5)
nRBC: 0 % (ref 0.0–0.2)

## 2022-10-16 LAB — RENAL FUNCTION PANEL
Albumin: 2.8 g/dL — ABNORMAL LOW (ref 3.5–5.0)
Anion gap: 9 (ref 5–15)
BUN: 19 mg/dL (ref 8–23)
CO2: 22 mmol/L (ref 22–32)
Calcium: 8.1 mg/dL — ABNORMAL LOW (ref 8.9–10.3)
Chloride: 102 mmol/L (ref 98–111)
Creatinine, Ser: 0.62 mg/dL (ref 0.61–1.24)
GFR, Estimated: >60 mL/min (ref >60)
Glucose, Bld: 120 mg/dL — ABNORMAL HIGH (ref 70–99)
Phosphorus: 4.2 mg/dL (ref 2.5–4.6)
Potassium: 3.7 mmol/L (ref 3.5–5.1)
Sodium: 133 mmol/L — ABNORMAL LOW (ref 135–145)

## 2022-10-16 LAB — GLUCOSE, CAPILLARY
Glucose-Capillary: 108 mg/dL — ABNORMAL HIGH (ref 70–99)
Glucose-Capillary: 132 mg/dL — ABNORMAL HIGH (ref 70–99)
Glucose-Capillary: 141 mg/dL — ABNORMAL HIGH (ref 70–99)
Glucose-Capillary: 166 mg/dL — ABNORMAL HIGH (ref 70–99)

## 2022-10-16 LAB — MAGNESIUM: Magnesium: 2 mg/dL (ref 1.7–2.4)

## 2022-10-16 MED ORDER — HYDROCODONE-ACETAMINOPHEN 5-325 MG PO TABS
1.0000 | ORAL_TABLET | ORAL | Status: DC | PRN
  Administered 2022-10-16 – 2022-10-17 (×3): 2 via ORAL
  Administered 2022-10-17 (×2): 1 via ORAL
  Administered 2022-10-18 – 2022-10-20 (×8): 2 via ORAL
  Filled 2022-10-16: qty 2
  Filled 2022-10-16: qty 1
  Filled 2022-10-16 (×9): qty 2
  Filled 2022-10-16: qty 1
  Filled 2022-10-16 (×2): qty 2

## 2022-10-16 NOTE — Evaluation (Addendum)
Physical Therapy Evaluation Patient Details Name: Joseph Hogan MRN: [id number] DOB: [birth year] Today's Date: 10/16/2022  History of Present Illness  62 yo male presents to therapy s/p hospital admission on 10/14/2022 secondary to B LE pain R > L attributed to metastatic rectal ca. CT of chest and abdomen revealed rib and L pelvic lesions and in addition a moderate volume of stool.  Pt presented to [facility] ED on 10/11/2022 due to severe pain. Pt PMH includes but is not limited to: DM II, HTN, HLD, rectal ca, fall, L femur fx and THA, posterior lateral approach 02/06/2022.  Clinical Impression      Pt admitted with above diagnosis.  Pt currently with functional limitations due to the deficits listed below (see PT Problem List). PT coordinated with pt and nursing staff to ensure pt was premedicated for Evaluation. PT returned ~ 1 hr later for intervention. Pt agreeable however hesitant due to anticipated pain. Pt required increased time and min A for B LE to EOB. Pt required CGA and cues for sit to stand  from elevated EOB. Pt required CGA, recliner close with RW for 18 feet with shuffling gait pattern, wide BOS, B knee flexion throughout gait cycle, absent L heel strike and slow cadence. Pt left seated in recliner and all needs in place. Pt indicated 5/10 pain at rest and pain increased to 7/10 with mobility B LE R > L. Pt will benefit from acute skilled PT to increase their independence and safety with mobility to allow discharge.       If plan is discharge home, recommend the following: A little help with walking and/or transfers;A little help with bathing/dressing/bathroom;Assistance with cooking/housework;Assist for transportation;Help with stairs or ramp for entrance   Can travel by private vehicle        Equipment Recommendations Rolling walker (2 wheels)  Recommendations for Other Services       Functional Status Assessment Patient has had a recent decline in their functional status and  demonstrates the ability to make significant improvements in function in a reasonable and predictable amount of time.     Precautions / Restrictions Precautions Precautions: Fall Restrictions Weight Bearing Restrictions: No      Mobility  Bed Mobility Overal bed mobility: Needs Assistance Bed Mobility: Supine to Sit     Supine to sit: Min assist, HOB elevated, Used rails     General bed mobility comments: increased time, difficulty sliding B LE to EOB and anterior scoot in sitting    Transfers Overall transfer level: Needs assistance Equipment used: Rolling walker (2 wheels) Transfers: Sit to/from Stand Sit to Stand: Contact guard assist, From elevated surface           General transfer comment: cues for proper UE placement and encouragement pt fearful of pain    Ambulation/Gait Ambulation/Gait assistance: Contact guard assist Gait Distance (Feet): 18 Feet Assistive device: Rolling walker (2 wheels) Gait Pattern/deviations: Step-to pattern, Shuffle, Knee flexed in stance - right, Knee flexed in stance - left, Antalgic, Wide base of support Gait velocity: decreased     General Gait Details: B knee flexion through out gait cycle, absent L heel stride, wide BOS with B toe out  Stairs            Wheelchair Mobility     Tilt Bed    Modified Rankin (Stroke Patients Only)       Balance Overall balance assessment: Needs assistance, History of Falls Sitting-balance support: Feet supported Sitting balance-Leahy Scale: Good  Standing balance support: Bilateral upper extremity supported, During functional activity, Reliant on assistive device for balance Standing balance-Leahy Scale: Poor                               Pertinent Vitals/Pain Pain Assessment Pain Assessment: 0-10 Pain Score: 5  (5/10 at rest ~ 60 min s/p pain medication) Pain Location: B  LE  R > L Pain Descriptors / Indicators: Aching, Burning, Dull Pain Intervention(s):  Limited activity within patient's tolerance, Monitored during session, Premedicated before session, Repositioned, Relaxation    Home Living Family/patient expects to be discharged to:: Private residence Living Arrangements: Alone Available Help at Discharge: Family;Friend(s) Type of Home: House Home Access: Level entry       Home Layout: One level Home Equipment: Agricultural consultant (2 wheels);Rollator (4 wheels);Shower seat;Transport chair      Prior Function Prior Level of Function : Independent/Modified Independent             Mobility Comments: mod I with rollator for ADLs ADLs Comments: A for IADLs     Extremity/Trunk Assessment        Lower Extremity Assessment Lower Extremity Assessment: Generalized weakness (noted B knee flexion t/o gait cycle and pt is unable to achive full extension in long sit in recliner and reports B knee arthtitis)    Cervical / Trunk Assessment Cervical / Trunk Assessment:  (wfl)  Communication   Communication Communication: No apparent difficulties  Cognition Arousal: Alert Behavior During Therapy: WFL for tasks assessed/performed Overall Cognitive Status: Within Functional Limits for tasks assessed                                          General Comments      Exercises     Assessment/Plan    PT Assessment Patient needs continued PT services  PT Problem List Decreased strength;Decreased activity tolerance;Decreased balance;Decreased mobility;Pain       PT Treatment Interventions DME instruction;Gait training;Functional mobility training;Therapeutic activities;Therapeutic exercise;Balance training;Neuromuscular re-education;Patient/family education    PT Goals (Current goals can be found in the Care Plan section)  Acute Rehab PT Goals Patient Stated Goal: to be able to walk no pain and transition to cane PT Goal Formulation: With patient Time For Goal Achievement: 10/30/22 Potential to Achieve Goals:  Good    Frequency Min 1X/week     Co-evaluation               AM-PAC PT "6 Clicks" Mobility  Outcome Measure Help needed turning from your back to your side while in a flat bed without using bedrails?: A Little Help needed moving from lying on your back to sitting on the side of a flat bed without using bedrails?: A Little Help needed moving to and from a bed to a chair (including a wheelchair)?: A Little Help needed standing up from a chair using your arms (e.g., wheelchair or bedside chair)?: A Little Help needed to walk in hospital room?: A Little Help needed climbing 3-5 steps with a railing? : Total 6 Click Score: 16    End of Session Equipment Utilized During Treatment: Gait belt Activity Tolerance: Patient limited by pain;Patient limited by fatigue Patient left: in chair;with call bell/phone within reach Nurse Communication: Mobility status PT Visit Diagnosis: Unsteadiness on feet (R26.81);Other abnormalities of gait and mobility (R26.89);Muscle weakness (generalized) (M62.81);History  of falling (Z91.81);Difficulty in walking, not elsewhere classified (R26.2);Other (comment)    Time: 1610-9604 PT Time Calculation (min) (ACUTE ONLY): 39 min   Charges:   PT Evaluation $PT Eval Low Complexity: 1 Low PT Treatments $Gait Training: 8-22 mins $Therapeutic Activity: 8-22 mins PT General Charges $$ ACUTE PT VISIT: 1 Visit         Johnny Bridge, PT Acute Rehab   Jacqualyn Posey 10/16/2022, 3:27 PM

## 2022-10-16 NOTE — TOC Initial Note (Addendum)
Transition of Care [facility]) - Initial/Assessment Note    Patient Details  Name: Joseph Hogan MRN: [id number] Date of Birth: [birth year]  [facility]) CM/SW Contact:    Erin Sons, LCSW Phone Number: 10/16/2022, 3:30 PM  Clinical Narrative:                  PT recommending HH at DC. CSW met with pt to discuss. He lives at home alone in [place]. He has a wheelchair and a rollator at home but explains he would like a rolling walker. PT has included [facility] in their recommendations. Pt also agreeable to [facility]. He has had [facility] in the past and is requesting HH be arranged with them. He specifically asks for one of their therapists that lived up the street from him named Denzil Magnuson.   HH PT arranged with Frances [facility]urbish (informed that pt was requesting Denzil Magnuson) and rolling walker ordered from [employer].   Expected Discharge Plan: Home w Home Health Services Barriers to Discharge: Continued Medical Work up   Patient Goals and CMS Choice Patient states their goals for this hospitalization and ongoing recovery are:: [facility]   Choice offered to / list presented to : Patient      Expected Discharge Plan and Services       Living arrangements for the past 2 months: Single Family Home                                      Prior Living Arrangements/Services Living arrangements for the past 2 months: Single Family Home Lives with:: Self Patient language and need for interpreter reviewed:: Yes        Need for Family Participation in Patient Care: No (Comment) Care giver support system in place?: Yes (comment) Current home services: DME Criminal Activity/Legal Involvement Pertinent to Current Situation/Hospitalization: No - Comment as needed  Activities of Daily Living Home Assistive Devices/Equipment: Environmental consultant (specify type), Shower chair with back ADL Screening (condition at time of admission) Patient's cognitive ability adequate to safely complete daily  activities?: Yes Is the patient deaf or have difficulty hearing?: No Does the patient have difficulty seeing, even when wearing glasses/contacts?: No Does the patient have difficulty concentrating, remembering, or making decisions?: No Patient able to express need for assistance with ADLs?: Yes Does the patient have difficulty dressing or bathing?: No Independently performs ADLs?: Yes (appropriate for developmental age) Does the patient have difficulty walking or climbing stairs?: Yes Weakness of Legs: Right Weakness of Arms/Hands: None  Permission Sought/Granted                  Emotional Assessment Appearance:: Appears stated age Attitude/Demeanor/Rapport: Engaged Affect (typically observed): Accepting, Pleasant Orientation: : Oriented to Place, Oriented to Self, Oriented to  Time, Oriented to Situation Alcohol / Substance Use: Not Applicable Psych Involvement: No (comment)  Admission diagnosis:  Cancer associated pain [G89.3] Intractable pain [R52] 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) [C20, C79.51] Patient Active Problem List   Diagnosis Date Noted   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) 10/15/2022   Cancer associated pain 10/14/2022   Acneiform rash 09/25/2022   Iron deficiency anemia due to chronic blood loss 04/17/2022   Port-A-Cath in place 04/08/2022   Rectal carcinoma (HCC) 03/19/2022   Lytic bone lesion of femur 02/06/2022   Hip fracture (HCC) 02/03/2022   Lumbar radiculopathy 02/03/2022   Leukocytosis 02/03/2022  Hyperlipidemia 12/23/2016   Bilateral knee pain 06/29/2015   Arthritis, 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   Diabetes mellitus without complication (HCC) 04/11/2015   Morbid obesity (HCC) 01/12/2015   Essential hypertension, benign 01/10/2015   PCP:  [name], MD Pharmacy:   [facility] - [place], [place] - [id number] [place] [address] Phone: [phone] Fax: [phone]     Social Determinants  of Health (SDOH) Social History: SDOH Screenings   Food Insecurity: No Food Insecurity (10/15/2022)  Housing: Low Risk  (10/15/2022)  Transportation Needs: No Transportation Needs (10/15/2022)  Utilities: Not At Risk (10/15/2022)  Tobacco Use: Low Risk  (10/14/2022)   SDOH Interventions:     Readmission Risk Interventions     No data to display

## 2022-10-16 NOTE — Progress Notes (Signed)
[facility]                                                                               Joseph Hogan, is a 62 y.o. male, DOB - 1960/08/02, YQI:[id number] Admit date - 10/14/2022    Outpatient Primary MD for the patient is [name], MD  LOS - 2  days    Brief summary   62 year old gentleman with history of rectal carcinoma with metastasis to the bone, type 2 diabetes, hypertension, hyperlipidemia presented to the ED with constant right lower extremity pain. Patient noted to have been seen in the ED 10/11/2022 for ongoing right lower extremity pain, ultrasound obtained was negative for DVT with multiple lymph nodes in the right lower extremity and diffuse soft tissue edema of the right scrotum. Due to ongoing pain patient presented back to the ED. Patient admitted for pain management.    Assessment & Plan    Assessment and Plan:  right lower extremity pain secondary to metastatic rectal cancer with metastasis to the bone/cancer associated pain -It is noted that patient had run out of his long-acting pain medications 3 to 4 days prior to admission and noted to have run out of his oxycodone breakthrough pain meds per admitting physician. -Patient noted to have had a recent lower extremity ultrasound during last ED visit 10/11/2022 which was negative for DVT. -Patient started back on home regimen of OxyContin 15 mg p.o. twice daily, IV Dilaudid for severe breakthrough pain. -changed oxycodone to hydrocodone for mod breakthrough pain.  -??  May need radiation therapy for pain management however will defer to oncology.   2.  Hypokalemia -Replete.    3.  Hypertension WELL controlled.    4.  Well-controlled diabetes mellitus type 2 -Hemoglobin A1c 5.6 (10/14/2022). CBG (last 3)  Recent Labs    10/15/22 2102 10/16/22 0745 10/16/22 1105  GLUCAP 177* 108* 132*      5.  Rectal carcinoma with metastasis to the bone -Will inform patient's primary oncologist of  admission via epic. -Continue current pain management. -??  Need for radiation therapy for pain management. Will wait for oncology recommendations.    6.  Constipation -Likely secondary to opioid pain medication. -Place on MiraLAX twice daily, Senokot-S twice daily. -Dulcolax suppository ordered , if no BM, will plan for enema.    7.  Vitamin B12 deficiency -Vitamin B12 1000 mcg subcu daily x 7 days, and then weekly x 1 month, and then monthly. -Could likely transition to oral vitamin B12 supplementation on discharge.        Estimated body mass index is 29.57 kg/m as calculated from the following:   Height as of this encounter: 5\' 11"  (1.803 m).   Weight as of this encounter: 96.2 kg.  Code Status: full code.  DVT Prophylaxis:  enoxaparin (LOVENOX) injection 40 mg Start: 10/15/22 1800 SCDs Start: 10/15/22 0144   Level of Care: Level of care: Telemetry Family Communication:none at bedside.   Disposition Plan:     Remains inpatient appropriate:  pain control.   Procedures:  None.  Consultants:   Oncology Dr Leonides Schanz  Antimicrobials:   Anti-infectives (From admission, onward)  None        Medications  Scheduled Meds:  bisacodyl  10 mg Rectal Once   Chlorhexidine Gluconate Cloth  6 each Topical Daily   cyanocobalamin  1,000 mcg Subcutaneous Daily   enoxaparin (LOVENOX) injection  40 mg Subcutaneous Q24H   gabapentin  300 mg Oral QHS   insulin aspart  0-15 Units Subcutaneous TID WC   insulin aspart  0-5 Units Subcutaneous QHS   lidocaine-prilocaine   Topical Once   oxyCODONE  15 mg Oral Q12H   pantoprazole  20 mg Oral Daily   polyethylene glycol  17 g Oral BID   senna-docusate  1 tablet Oral BID   Continuous Infusions:  methocarbamol (ROBAXIN) IV     PRN Meds:.acetaminophen **OR** acetaminophen, HYDROcodone-acetaminophen, HYDROmorphone (DILAUDID) injection, methocarbamol (ROBAXIN) IV, ondansetron **OR** ondansetron (ZOFRAN) IV, polyethylene  glycol    Subjective:   Joseph Hogan was seen and examined today.  Pain not well controlled, changed oxy to vicodin.   Objective:   Vitals:   10/15/22 2302 10/16/22 0254 10/16/22 0641 10/16/22 1102  BP: 128/68 (!) 145/62 123/72 (!) 143/69  Pulse: 73 74 81 75  Resp: 14 14 14 18   Temp: 98.7 F (37.1 C) 100.2 F (37.9 C) 99.6 F (37.6 C) 97.7 F (36.5 C)  TempSrc: Oral Oral Oral Oral  SpO2: 92% 97% 95% 98%  Weight:      Height:        Intake/Output Summary (Last 24 hours) at 10/16/2022 1307 Last data filed at 10/16/2022 0930 Gross per 24 hour  Intake 240 ml  Output 325 ml  Net -85 ml   Filed Weights   10/14/22 1527  Weight: 96.2 kg     Exam General exam: Appears calm and comfortable  Respiratory system: Clear to auscultation. Respiratory effort normal. Cardiovascular system: S1 & S2 heard, RRR.  Gastrointestinal system: Abdomen is nondistended, soft and nontender.  Central nervous system: Alert and oriented. No focal neurological deficits. Extremities: Symmetric 5 x 5 power. Skin: No rashes, Psychiatry:  Mood & affect appropriate.    Data Reviewed:  I have personally reviewed following labs and imaging studies   CBC Lab Results  Component Value Date   WBC 4.3 10/16/2022   RBC 3.05 (L) 10/16/2022   HGB 9.0 (L) 10/16/2022   HCT 28.0 (L) 10/16/2022   MCV 91.8 10/16/2022   MCH 29.5 10/16/2022   PLT 172 10/16/2022   MCHC 32.1 10/16/2022   RDW 16.3 (H) 10/16/2022   LYMPHSABS 0.4 (L) 10/16/2022   MONOABS 0.7 10/16/2022   EOSABS 0.1 10/16/2022   BASOSABS 0.0 10/16/2022     Last metabolic panel Lab Results  Component Value Date   NA 133 (L) 10/16/2022   K 3.7 10/16/2022   CL 102 10/16/2022   CO2 22 10/16/2022   BUN 19 10/16/2022   CREATININE 0.62 10/16/2022   GLUCOSE 120 (H) 10/16/2022   GFRNONAA >60 10/16/2022   GFRAA >60 10/13/2019   CALCIUM 8.1 (L) 10/16/2022   PHOS 4.2 10/16/2022   PROT 6.5 10/15/2022   ALBUMIN 2.8 (L) 10/16/2022   LABGLOB  3.5 02/06/2022   AGRATIO 0.9 02/04/2022   BILITOT 0.9 10/15/2022   ALKPHOS 176 (H) 10/15/2022   AST 37 10/15/2022   ALT 13 10/15/2022   ANIONGAP 9 10/16/2022    CBG (last 3)  Recent Labs    10/15/22 2102 10/16/22 0745 10/16/22 1105  GLUCAP 177* 108* 132*      Coagulation Profile: No results for input(s): "INR", "  PROTIME" in the last 168 hours.   Radiology Studies: CT CHEST ABDOMEN PELVIS W CONTRAST  Result Date: 10/14/2022 CLINICAL DATA:  Colorectal carcinoma staging. Bilateral leg pain which is severe. 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 * Tracking Code: BO * EXAM: CT CHEST, ABDOMEN, AND PELVIS WITH CONTRAST TECHNIQUE: Multidetector CT imaging of the chest, abdomen and pelvis was performed following the standard protocol during bolus administration of intravenous contrast. RADIATION DOSE REDUCTION: This exam was performed according to the departmental dose-optimization program which includes automated exposure control, adjustment of the mA and/or kV according to patient size and/or use of iterative reconstruction technique. CONTRAST:  OMNIPAQUE IOHEXOL 300 MG/ML  SOLN COMPARISON:  CT pelvis 07/23/2022, PET-CT scan 07/11/2022 FINDINGS: CT CHEST FINDINGS CT CHEST FINDINGS Cardiovascular: Port in the anterior chest wall with tip in distal SVC. No significant vascular findings. Normal heart size. No pericardial effusion. Mediastinum/Nodes: No axillary or supraclavicular adenopathy. No mediastinal or hilar adenopathy. No pericardial fluid. Esophagus normal. Lungs/Pleura: No suspicious pulmonary nodules. Normal pleural. Airways normal. Musculoskeletal: Expansile rib lesions hypermetabolic on comparison PET-CT scan. Lesion the anterior LEFT third rib in the anterior RIGHT fourth rib. Findings similar to PET-CT 07/11/2022 CT ABDOMEN AND PELVIS FINDINGS Hepatobiliary: No focal hepatic lesion. Pancreas: Pancreas is normal. No ductal dilatation. No pancreatic  inflammation. Spleen: Normal spleen Adrenals/urinary tract: Adrenal glands normal. Simple fluid attenuation cyst in the RIGHT kidney. Right Bosniak I benign renal cyst. No follow-up imaging is recommended. JACR 2018 Feb; 264-273, Management of the Incidental Renal Mass on CT, [employer] 2021; 814-848, Bosniak Classification of Cystic Renal Masses, Version 2019. Ureters and bladder normal Stomach/Bowel: Stomach, small bowel, appendix, and cecum are normal. Moderate volume stool throughout the entire colon. Vascular/Lymphatic: Abdominal aorta is normal caliber with atherosclerotic calcification. There is no retroperitoneal or periportal lymphadenopathy. No pelvic lymphadenopathy. Reproductive: Unremarkable Other: Haziness in the presacral space may relate to prior radiation treatment. Musculoskeletal: Multiple expansile metastatic lesions in the pelvis. These lesions are bulky benign signal change from prior. For example lesion involving the RIGHT inferior ischium measuring 7.5 by 4.1 cm (image 118/3. These lesions are hypermetabolic on comparison PET-CT scan. Extensive involvement of the LEFT pelvis and sacrum. No pathologic fracture. LEFT hip prosthetic. IMPRESSION: 1. No pulmonary metastasis.  No lymphadenopathy 2. Multiple expansile rib lesions again noted. 3. No evidence of bowel obstruction. Moderate volume stool throughout the colon suggest constipation. 4. Expansile metastatic lesions in the LEFT pelvis. These lesions are extremely bulky but not changed from prior. 5. No acute osseous findings. Electronically Signed   By: Genevive Bi M.D.   On: 10/14/2022 19:30   CT L-SPINE NO CHARGE  Result Date: 10/14/2022 CLINICAL DATA:  saw on the [facility]th at [facility] for the same. Pt here with complaints of bilateral leg pain so severe that he is unable to sleep. Pt tearful in triage and request that he not get a IM shot of Dilaudid again. Colon cancer staging. EXAM: CT LUMBAR SPINE WITHOUT CONTRAST TECHNIQUE:  Multidetector CT imaging of the lumbar spine was performed without intravenous contrast administration. Multiplanar CT image reconstructions were also generated. RADIATION DOSE REDUCTION: This exam was performed according to the departmental dose-optimization program which includes automated exposure control, adjustment of the mA and/or kV according to patient size and/or use of iterative reconstruction technique. COMPARISON:  X-ray lumbar spine 07/23/2022 FINDINGS: Segmentation: 5 lumbar type vertebrae with a sacralized L5. Alignment: Normal. Vertebrae: Multilevel mild to moderate degenerative changes of the spine. Posterior disc  osteophyte complex formation at the L4-L5 level. No associated severe osseous neural foraminal or central canal stenosis. No acute fracture. Poorly defined wide zone of transition sclerotic lesions of the sacrum, L5 vertebral body, iliac bones suggestive of osseous metastases in the setting of colon cancer. Question similar finding of the spinous process of the L2 vertebral body. Paraspinal and other soft tissues: Negative. Disc levels: Maintained. IMPRESSION: 1. No acute displaced fracture or traumatic listhesis of the lumbar spine. 2. Lumbosacral and iliac findings suggestive of a sclerotic osseous metastases in the setting of colon cancer. 3. Please see separately dictated CT chest, abdomen, pelvis 10/14/2022. Electronically Signed   By: Tish Frederickson M.D.   On: 10/14/2022 19:17       Kathlen Mody M.D. [facility] 10/16/2022, 1:07 PM  Available via Epic secure chat 7am-7pm After 7 pm, please refer to night coverage provider listed on amion.

## 2022-10-17 DIAGNOSIS — G893 Neoplasm related pain (acute) (chronic): Secondary | ICD-10-CM | POA: Diagnosis not present

## 2022-10-17 DIAGNOSIS — E119 Type 2 diabetes mellitus without complications: Secondary | ICD-10-CM | POA: Diagnosis not present

## 2022-10-17 DIAGNOSIS — I10 Essential (primary) hypertension: Secondary | ICD-10-CM | POA: Diagnosis not present

## 2022-10-17 DIAGNOSIS — C20 Malignant neoplasm of rectum: Secondary | ICD-10-CM | POA: Diagnosis not present

## 2022-10-17 LAB — GLUCOSE, CAPILLARY
Glucose-Capillary: 129 mg/dL — ABNORMAL HIGH (ref 70–99)
Glucose-Capillary: 111 mg/dL — ABNORMAL HIGH (ref 70–99)
Glucose-Capillary: 185 mg/dL — ABNORMAL HIGH (ref 70–99)
Glucose-Capillary: 155 mg/dL — ABNORMAL HIGH (ref 70–99)

## 2022-10-17 MED ORDER — BIOTENE DRY MOUTH MT LIQD: 15.0000 mL | OROMUCOSAL | Status: DC | PRN

## 2022-10-17 NOTE — Progress Notes (Signed)
Mobility Specialist - Progress Note   10/17/22 1021  Mobility  Activity Ambulated with assistance in hallway  Level of Assistance Standby assist, set-up cues, supervision of patient - no hands on  Assistive Device Front wheel walker  Distance Ambulated (ft) 40 ft  Activity Response Tolerated well  Mobility Referral Yes  $Mobility charge 1 Mobility  Mobility Specialist Start Time (ACUTE ONLY) 1000  Mobility Specialist Stop Time (ACUTE ONLY) 1018  Mobility Specialist Time Calculation (min) (ACUTE ONLY) 18 min   Pt received in bed and agreeable to mobility. No complaints during session. Pt to bed after session with all needs met.    [facility]

## 2022-10-17 NOTE — Care Management Important Message (Signed)
Important Message  Patient Details IM Letter given Name: Joseph Hogan MRN: [id number] Date of Birth: 1960/07/26   Medicare Important Message Given:  Yes     Caren Macadam 10/17/2022, 3:56 PM

## 2022-10-17 NOTE — Plan of Care (Signed)

## 2022-10-17 NOTE — Progress Notes (Signed)
[facility]                                                                               Joseph Hogan, is a 62 y.o. male, DOB - Mar 26, 1960, [id number] Admit date - 10/14/2022    Outpatient Primary MD for the patient is [name], MD  LOS - 3  days    Brief summary   62 year old gentleman with history of rectal carcinoma with metastasis to the bone, type 2 diabetes, hypertension, hyperlipidemia presented to the ED with constant right lower extremity pain. Patient noted to have been seen in the ED 10/11/2022 for ongoing right lower extremity pain, ultrasound obtained was negative for DVT with multiple lymph nodes in the right lower extremity and diffuse soft tissue edema of the right scrotum. Due to ongoing pain patient presented back to the ED. Patient admitted for pain management.    Assessment & Plan    Assessment and Plan:  Right lower extremity pain secondary to metastatic rectal cancer with metastasis to the bone/cancer associated pain -It is noted that patient had run out of his long-acting pain medications 3 to 4 days prior to admission and noted to have run out of his oxycodone breakthrough pain meds per admitting physician. -Patient noted to have had a recent lower extremity ultrasound during last ED visit 10/11/2022 which was negative for DVT. -Patient started back on home regimen of OxyContin 15 mg p.o. twice daily, IV Dilaudid for severe breakthrough pain. -changed oxycodone to hydrocodone for mod breakthrough pain, which he reports is much better than yesterday.    2.  Hypokalemia Replaced.    3.  Hypertension Optimal. Well controlled.    4.  Well-controlled diabetes mellitus type 2 -Hemoglobin A1c 5.6 (10/14/2022). CBG (last 3)  Recent Labs    10/16/22 2141 10/17/22 0724 10/17/22 1208  GLUCAP 166* 129* 111*   No changes in meds.    5.  Rectal carcinoma with metastasis to the bone -Will inform patient's primary oncologist of admission  via epic. -Continue current pain management. -??  Need for radiation therapy for pain management.    6.  Constipation -Likely secondary to opioid pain medication. -Place on MiraLAX twice daily, Senokot-S twice daily. -Dulcolax suppository ordered , if no BM, will plan for enema.    7.  Vitamin B12 deficiency -Vitamin B12 1000 mcg subcu daily x 7 days, and then weekly x 1 month, and then monthly. -Could likely transition to oral vitamin B12 supplementation on discharge.        Estimated body mass index is 29.57 kg/m as calculated from the following:   Height as of this encounter: 5\' 11"  (1.803 m).   Weight as of this encounter: 96.2 kg.  Code Status: full code.  DVT Prophylaxis:  enoxaparin (LOVENOX) injection 40 mg Start: 10/15/22 1800 SCDs Start: 10/15/22 0144   Level of Care: Level of care: Telemetry Family Communication:none at bedside.   Disposition Plan:     Remains inpatient appropriate:  pain control.   Procedures:  None.  Consultants:   Oncology Dr Leonides Schanz  Antimicrobials:   Anti-infectives (From admission, onward)    None  Medications  Scheduled Meds:  bisacodyl  10 mg Rectal Once   Chlorhexidine Gluconate Cloth  6 each Topical Daily   cyanocobalamin  1,000 mcg Subcutaneous Daily   enoxaparin (LOVENOX) injection  40 mg Subcutaneous Q24H   gabapentin  300 mg Oral QHS   insulin aspart  0-15 Units Subcutaneous TID WC   insulin aspart  0-5 Units Subcutaneous QHS   lidocaine-prilocaine   Topical Once   oxyCODONE  15 mg Oral Q12H   pantoprazole  20 mg Oral Daily   polyethylene glycol  17 g Oral BID   senna-docusate  1 tablet Oral BID   Continuous Infusions:  methocarbamol (ROBAXIN) IV     PRN Meds:.acetaminophen **OR** acetaminophen, HYDROcodone-acetaminophen, HYDROmorphone (DILAUDID) injection, methocarbamol (ROBAXIN) IV, ondansetron **OR** ondansetron (ZOFRAN) IV, polyethylene glycol    Subjective:   Joseph Hogan was seen and  examined today.  Pain better controlled with oxycontin and vicodin.   Objective:   Vitals:   10/16/22 1102 10/16/22 1458 10/16/22 2139 10/17/22 0636  BP: (!) 143/69 130/65 126/78 127/72  Pulse: 75 79 70 85  Resp: 18 18 20 20   Temp: 97.7 F (36.5 C) 98.8 F (37.1 C) 98 F (36.7 C)   TempSrc: Oral Oral Oral   SpO2: 98% 96% 95% 96%  Weight:      Height:        Intake/Output Summary (Last 24 hours) at 10/17/2022 1215 Last data filed at 10/17/2022 1000 Gross per 24 hour  Intake 240 ml  Output 1050 ml  Net -810 ml   Filed Weights   10/14/22 1527  Weight: 96.2 kg     Exam General exam: Appears calm and comfortable  Respiratory system: Clear to auscultation. Respiratory effort normal. Cardiovascular system: S1 & S2 heard, RRR. No JVD,  Gastrointestinal system: Abdomen is nondistended, soft and nontender.  Central nervous system: Alert and oriented. No focal neurological deficits. Extremities: Symmetric 5 x 5 power. Skin: No rashes, lesions or ulcers Psychiatry: Mood & affect appropriate.     Data Reviewed:  I have personally reviewed following labs and imaging studies   CBC Lab Results  Component Value Date   WBC 4.3 10/16/2022   RBC 3.05 (L) 10/16/2022   HGB 9.0 (L) 10/16/2022   HCT 28.0 (L) 10/16/2022   MCV 91.8 10/16/2022   MCH 29.5 10/16/2022   PLT 172 10/16/2022   MCHC 32.1 10/16/2022   RDW 16.3 (H) 10/16/2022   LYMPHSABS 0.4 (L) 10/16/2022   MONOABS 0.7 10/16/2022   EOSABS 0.1 10/16/2022   BASOSABS 0.0 10/16/2022     Last metabolic panel Lab Results  Component Value Date   NA 133 (L) 10/16/2022   K 3.7 10/16/2022   CL 102 10/16/2022   CO2 22 10/16/2022   BUN 19 10/16/2022   CREATININE 0.62 10/16/2022   GLUCOSE 120 (H) 10/16/2022   GFRNONAA >60 10/16/2022   GFRAA >60 10/13/2019   CALCIUM 8.1 (L) 10/16/2022   PHOS 4.2 10/16/2022   PROT 6.5 10/15/2022   ALBUMIN 2.8 (L) 10/16/2022   LABGLOB 3.5 02/06/2022   AGRATIO 0.9 02/04/2022   BILITOT 0.9  10/15/2022   ALKPHOS 176 (H) 10/15/2022   AST 37 10/15/2022   ALT 13 10/15/2022   ANIONGAP 9 10/16/2022    CBG (last 3)  Recent Labs    10/16/22 2141 10/17/22 0724 10/17/22 1208  GLUCAP 166* 129* 111*      Coagulation Profile: No results for input(s): "INR", "PROTIME" in the last 168 hours.  Radiology Studies: No results found.     Kathlen Mody M.D. [facility] 10/17/2022, 12:15 PM  Available via Epic secure chat 7am-7pm After 7 pm, please refer to night coverage provider listed on amion.

## 2022-10-17 NOTE — Plan of Care (Signed)
Problem: Education: Goal: Ability to describe self-care measures that may prevent or decrease complications (Diabetes Survival Skills Education) will improve 10/17/2022 0606 by Mia Creek, RN Outcome: Progressing 10/17/2022 0531 by Mia Creek, RN Outcome: Progressing Goal: Individualized Educational Video(s) 10/17/2022 0606 by Mia Creek, RN Outcome: Progressing 10/17/2022 0531 by Mia Creek, RN Outcome: Progressing   Problem: Education: Goal: Ability to describe self-care measures that may prevent or decrease complications (Diabetes Survival Skills Education) will improve 10/17/2022 0606 by Mia Creek, RN Outcome: Progressing 10/17/2022 0531 by Mia Creek, RN Outcome: Progressing   Problem: Education: Goal: Individualized Educational Video(s) 10/17/2022 0606 by Mia Creek, RN Outcome: Progressing 10/17/2022 0531 by Mia Creek, RN Outcome: Progressing   Problem: Coping: Goal: Ability to adjust to condition or change in health will improve 10/17/2022 0606 by Mia Creek, RN Outcome: Progressing 10/17/2022 0531 by Mia Creek, RN Outcome: Progressing   Problem: Coping: Goal: Ability to adjust to condition or change in health will improve 10/17/2022 0606 by Mia Creek, RN Outcome: Progressing 10/17/2022 0531 by Mia Creek, RN Outcome: Progressing   Problem: Fluid Volume: Goal: Ability to maintain a balanced intake and output will improve 10/17/2022 0606 by Mia Creek, RN Outcome: Progressing 10/17/2022 0531 by Mia Creek, RN Outcome: Progressing   Problem: Fluid Volume: Goal: Ability to maintain a balanced intake and output will improve 10/17/2022 0606 by Mia Creek, RN Outcome: Progressing 10/17/2022 0531 by Mia Creek, RN Outcome: Progressing   Problem: Health Behavior/Discharge Planning: Goal: Ability to identify and utilize available resources and services will  improve 10/17/2022 0606 by Mia Creek, RN Outcome: Progressing 10/17/2022 0531 by Mia Creek, RN Outcome: Progressing Goal: Ability to manage health-related needs will improve 10/17/2022 0606 by Mia Creek, RN Outcome: Progressing 10/17/2022 0531 by Mia Creek, RN Outcome: Progressing   Problem: Health Behavior/Discharge Planning: Goal: Ability to identify and utilize available resources and services will improve 10/17/2022 0606 by Mia Creek, RN Outcome: Progressing 10/17/2022 0531 by Mia Creek, RN Outcome: Progressing   Problem: Health Behavior/Discharge Planning: Goal: Ability to manage health-related needs will improve 10/17/2022 0606 by Mia Creek, RN Outcome: Progressing 10/17/2022 0531 by Mia Creek, RN Outcome: Progressing   Problem: Metabolic: Goal: Ability to maintain appropriate glucose levels will improve 10/17/2022 0606 by Mia Creek, RN Outcome: Progressing 10/17/2022 0531 by Mia Creek, RN Outcome: Progressing   Problem: Metabolic: Goal: Ability to maintain appropriate glucose levels will improve 10/17/2022 0606 by Mia Creek, RN Outcome: Progressing 10/17/2022 0531 by Mia Creek, RN Outcome: Progressing   Problem: Nutritional: Goal: Maintenance of adequate nutrition will improve 10/17/2022 0606 by Mia Creek, RN Outcome: Progressing 10/17/2022 0531 by Mia Creek, RN Outcome: Progressing Goal: Progress toward achieving an optimal weight will improve 10/17/2022 0606 by Mia Creek, RN Outcome: Progressing 10/17/2022 0531 by Mia Creek, RN Outcome: Progressing   Problem: Nutritional: Goal: Progress toward achieving an optimal weight will improve 10/17/2022 0606 by Mia Creek, RN Outcome: Progressing 10/17/2022 0531 by Mia Creek, RN Outcome: Progressing   Problem: Skin Integrity: Goal: Risk for impaired skin integrity will  decrease 10/17/2022 0606 by Mia Creek, RN Outcome: Progressing 10/17/2022 0531 by Mia Creek, RN Outcome: Progressing   Problem: Tissue Perfusion: Goal: Adequacy of tissue perfusion will improve Outcome: Progressing   Problem: Education: Goal: Knowledge of General Education information will improve Description: Including pain rating scale, medication(s)/side effects and non-pharmacologic comfort measures Outcome: Progressing   Problem: Health Behavior/Discharge Planning: Goal: Ability to manage health-related needs will improve Outcome: Progressing   Problem: Clinical Measurements: Goal: Ability  to maintain clinical measurements within normal limits will improve Outcome: Progressing Goal: Will remain free from infection Outcome: Progressing Goal: Diagnostic test results will improve Outcome: Progressing Goal: Respiratory complications will improve Outcome: Progressing Goal: Cardiovascular complication will be avoided Outcome: Progressing   Problem: Clinical Measurements: Goal: Will remain free from infection Outcome: Progressing   Problem: Clinical Measurements: Goal: Diagnostic test results will improve Outcome: Progressing   Problem: Clinical Measurements: Goal: Respiratory complications will improve Outcome: Progressing   Problem: Clinical Measurements: Goal: Cardiovascular complication will be avoided Outcome: Progressing   Problem: Activity: Goal: Risk for activity intolerance will decrease Outcome: Progressing   Problem: Activity: Goal: Risk for activity intolerance will decrease Outcome: Progressing   Problem: Nutrition: Goal: Adequate nutrition will be maintained Outcome: Progressing   Problem: Nutrition: Goal: Adequate nutrition will be maintained Outcome: Progressing   Problem: Coping: Goal: Level of anxiety will decrease Outcome: Progressing   Problem: Coping: Goal: Level of anxiety will decrease Outcome: Progressing    Problem: Elimination: Goal: Will not experience complications related to bowel motility Outcome: Progressing Goal: Will not experience complications related to urinary retention Outcome: Progressing   Problem: Elimination: Goal: Will not experience complications related to bowel motility Outcome: Progressing   Problem: Pain Managment: Goal: General experience of comfort will improve Outcome: Progressing   Problem: Safety: Goal: Ability to remain free from injury will improve Outcome: Progressing   Problem: Skin Integrity: Goal: Risk for impaired skin integrity will decrease Outcome: Progressing

## 2022-10-17 NOTE — Plan of Care (Signed)
  Problem: Education: Goal: Ability to describe self-care measures that may prevent or decrease complications (Diabetes Survival Skills Education) will improve Outcome: Progressing Goal: Individualized Educational Video(s) Outcome: Progressing   Problem: Education: Goal: Individualized Educational Video(s) Outcome: Progressing   Problem: Coping: Goal: Ability to adjust to condition or change in health will improve Outcome: Progressing   Problem: Fluid Volume: Goal: Ability to maintain a balanced intake and output will improve Outcome: Progressing   Problem: Health Behavior/Discharge Planning: Goal: Ability to identify and utilize available resources and services will improve Outcome: Progressing Goal: Ability to manage health-related needs will improve Outcome: Progressing   Problem: Health Behavior/Discharge Planning: Goal: Ability to manage health-related needs will improve Outcome: Progressing   Problem: Metabolic: Goal: Ability to maintain appropriate glucose levels will improve Outcome: Progressing   Problem: Nutritional: Goal: Maintenance of adequate nutrition will improve Outcome: Progressing Goal: Progress toward achieving an optimal weight will improve Outcome: Progressing   Problem: Nutritional: Goal: Progress toward achieving an optimal weight will improve Outcome: Progressing   Problem: Skin Integrity: Goal: Risk for impaired skin integrity will decrease Outcome: Progressing

## 2022-10-18 ENCOUNTER — Inpatient Hospital Stay (HOSPITAL_COMMUNITY): Payer: Medicare HMO

## 2022-10-18 ENCOUNTER — Inpatient Hospital Stay: Payer: Medicare HMO

## 2022-10-18 DIAGNOSIS — G893 Neoplasm related pain (acute) (chronic): Secondary | ICD-10-CM | POA: Diagnosis not present

## 2022-10-18 DIAGNOSIS — E119 Type 2 diabetes mellitus without complications: Secondary | ICD-10-CM | POA: Diagnosis not present

## 2022-10-18 DIAGNOSIS — I10 Essential (primary) hypertension: Secondary | ICD-10-CM | POA: Diagnosis not present

## 2022-10-18 DIAGNOSIS — C20 Malignant neoplasm of rectum: Secondary | ICD-10-CM | POA: Diagnosis not present

## 2022-10-18 LAB — GLUCOSE, CAPILLARY
Glucose-Capillary: 131 mg/dL — ABNORMAL HIGH (ref 70–99)
Glucose-Capillary: 155 mg/dL — ABNORMAL HIGH (ref 70–99)
Glucose-Capillary: 126 mg/dL — ABNORMAL HIGH (ref 70–99)
Glucose-Capillary: 180 mg/dL — ABNORMAL HIGH (ref 70–99)

## 2022-10-18 MED ORDER — MENTHOL 3 MG MT LOZG
1.0000 | LOZENGE | OROMUCOSAL | Status: DC | PRN
  Administered 2022-10-18: 3 mg via ORAL
  Filled 2022-10-18: qty 9

## 2022-10-18 MED ORDER — HYDROMORPHONE HCL 1 MG/ML IJ SOLN
0.5000 mg | Freq: Four times a day (QID) | INTRAMUSCULAR | Status: DC | PRN
  Administered 2022-10-18 – 2022-10-21 (×6): 0.5 mg via INTRAVENOUS
  Filled 2022-10-18 (×6): qty 0.5

## 2022-10-18 NOTE — Progress Notes (Signed)
[facility]                                                                               [name], is a 62 y.o. 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 Patient noted to have been seen in the ED 10/11/2022 for ongoing right lower extremity pain, ultrasound obtained was negative for DVT with multiple lymph nodes in the right lower extremity and diffuse soft tissue edema of the right scrotum. Due to ongoing pain patient presented back to the ED. Patient admitted for pain management.    Assessment & Plan    Assessment and Plan:  Right lower extremity pain secondary to metastatic rectal cancer with metastasis to the bone/cancer associated pain -It is noted that patient had run out of his long-acting pain medications 3 to 4 days prior to admission and noted to have run out of his oxycodone breakthrough pain meds per admitting physician. -Patient noted to have had a recent lower extremity ultrasound during last ED visit 10/11/2022 which was negative for DVT. -Patient started back on home regimen of OxyContin 15 mg p.o. twice daily, IV Dilaudid for severe breakthrough pain. -changed oxycodone to hydrocodone for mod breakthrough pain, which he reports is much better than yesterday.    2.  Hypokalemia Replaced.    3.  Hypertension Well controlled.    4.  Well-controlled diabetes mellitus type 2 -Hemoglobin A1c 5.6 (10/14/2022). CBG (last 3)  Recent Labs    10/17/22 2114 10/18/22 0748 10/18/22 1132  GLUCAP 155* 131* 155*   No changes in meds.    5.  Rectal carcinoma with metastasis to the bone -Will inform patient's primary oncologist of admission via  epic. -Continue current pain management. -??  Need for radiation therapy for pain management.    6.  Constipation -Likely secondary to opioid pain medication. -Place on MiraLAX twice daily, Senokot-S twice daily. -Dulcolax suppository ordered , if no BM, will plan for enema.    7.  Vitamin B12 deficiency -Vitamin B12 1000 mcg subcu daily x 7 days, and then weekly x 1 month, and then monthly. -Could likely transition to oral vitamin B12 supplementation on discharge.     8. Left shoulder pain:  Chronic , but worsening . Sharp pain in the left anterior aspect. Radiating.  Not well controlled with pain meds.    Estimated body mass index is 29.57 kg/m as calculated from the following:   Height as of this encounter: 5\' 11"  (1.803 m).   Weight as of this encounter: 96.2 kg.  Code Status: full code.  DVT Prophylaxis:  enoxaparin (LOVENOX) injection 40 mg Start: 10/15/22 1800 SCDs Start: 10/15/22 0144   Level of Care: Level of care: Telemetry Family Communication:none at bedside.   Disposition Plan:     Remains inpatient appropriate:  pain control.   Procedures:  None.  Consultants:   Oncology Dr Leonides Schanz  Antimicrobials:   Anti-infectives (From admission, onward)    None        Medications  Scheduled Meds:  bisacodyl  10 mg Rectal Once   Chlorhexidine Gluconate Cloth  6 each Topical Daily   cyanocobalamin  1,000 mcg Subcutaneous Daily   enoxaparin (LOVENOX) injection  40 mg Subcutaneous Q24H   gabapentin  300 mg Oral QHS   insulin aspart  0-15 Units Subcutaneous TID WC   insulin aspart  0-5 Units Subcutaneous QHS   lidocaine-prilocaine   Topical Once   oxyCODONE  15 mg Oral Q12H   pantoprazole  20 mg Oral Daily   polyethylene glycol  17 g Oral BID   senna-docusate  1 tablet Oral BID   Continuous Infusions:  methocarbamol (ROBAXIN) IV     PRN Meds:.acetaminophen **OR** acetaminophen, antiseptic oral rinse, HYDROcodone-acetaminophen, HYDROmorphone (DILAUDID)  injection, methocarbamol (ROBAXIN) IV, ondansetron **OR** ondansetron (ZOFRAN) IV, polyethylene glycol    Subjective:   Joseph Hogan was seen and examined today.  Left shoulder pain , worse today.    Objective:   Vitals:   10/17/22 0636 10/17/22 1417 10/17/22 1950 10/18/22 0543  BP: 127/72 (!) 145/71 134/71 (!) 140/71  Pulse: 85 82 80 78  Resp: 20 15 18 18   Temp:  98.3 F (36.8 C) 98.8 F (37.1 C) 98.1 F (36.7 C)  TempSrc:  Oral Oral Oral  SpO2: 96% 100% 100% 100%  Weight:      Height:        Intake/Output Summary (Last 24 hours) at 10/18/2022 1305 Last data filed at 10/18/2022 1046 Gross per 24 hour  Intake 240 ml  Output 350 ml  Net -110 ml   Filed Weights   10/14/22 1527  Weight: 96.2 kg     Exam General exam: Appears calm and comfortable  Respiratory system: Clear to auscultation. Respiratory effort normal. Cardiovascular system: S1 & S2 heard, RRR. No JVD,  Gastrointestinal system: Abdomen is nondistended, soft and nontender.  Central nervous system: Alert and oriented. No focal neurological deficits. Extremities: Symmetric 5 x 5 power. Skin: No rashes,  Psychiatry: Mood & affect appropriate.      Data Reviewed:  I have personally reviewed following labs and imaging studies   CBC Lab Results  Component Value Date   WBC 4.3 10/16/2022   RBC 3.05 (L) 10/16/2022   HGB 9.0 (L) 10/16/2022   HCT 28.0 (L) 10/16/2022   MCV 91.8 10/16/2022   MCH 29.5 10/16/2022   PLT 172 10/16/2022   MCHC 32.1 10/16/2022   RDW 16.3 (H) 10/16/2022   LYMPHSABS 0.4 (L) 10/16/2022   MONOABS 0.7 10/16/2022   EOSABS 0.1 10/16/2022   BASOSABS 0.0 10/16/2022     Last metabolic panel Lab Results  Component Value Date   NA 133 (L) 10/16/2022   K 3.7 10/16/2022   CL 102 10/16/2022   CO2 22 10/16/2022   BUN 19 10/16/2022   CREATININE 0.62 10/16/2022   GLUCOSE 120 (H) 10/16/2022   GFRNONAA >60 10/16/2022   GFRAA >60 10/13/2019   CALCIUM 8.1 (L) 10/16/2022   PHOS 4.2  10/16/2022   PROT 6.5 10/15/2022   ALBUMIN 2.8 (L) 10/16/2022   LABGLOB 3.5 02/06/2022   AGRATIO 0.9 02/04/2022   BILITOT 0.9 10/15/2022   ALKPHOS 176 (H) 10/15/2022   AST 37 10/15/2022   ALT 13 10/15/2022   ANIONGAP 9 10/16/2022    CBG (last 3)  Recent Labs    10/17/22  2114 10/18/22 0748 10/18/22 1132  GLUCAP 155* 131* 155*      Coagulation Profile: No results for input(s): "INR", "PROTIME" in the last 168 hours.   Radiology Studies: No results found.     Kathlen Mody M.D. [facility] 10/18/2022, 1:05 PM  Available via Epic secure chat 7am-7pm After 7 pm, please refer to night coverage provider listed on amion.

## 2022-10-18 NOTE — Plan of Care (Signed)
  Problem: Education: Goal: Ability to describe self-care measures that may prevent or decrease complications (Diabetes Survival Skills Education) will improve Outcome: Progressing Goal: Individualized Educational Video(s) Outcome: Progressing   Problem: Education: Goal: Individualized Educational Video(s) Outcome: Progressing   Problem: Coping: Goal: Ability to adjust to condition or change in health will improve Outcome: Progressing   Problem: Fluid Volume: Goal: Ability to maintain a balanced intake and output will improve Outcome: Progressing   Problem: Health Behavior/Discharge Planning: Goal: Ability to identify and utilize available resources and services will improve Outcome: Progressing Goal: Ability to manage health-related needs will improve Outcome: Progressing   Problem: Health Behavior/Discharge Planning: Goal: Ability to manage health-related needs will improve Outcome: Progressing   Problem: Metabolic: Goal: Ability to maintain appropriate glucose levels will improve Outcome: Progressing   Problem: Nutritional: Goal: Maintenance of adequate nutrition will improve Outcome: Progressing Goal: Progress toward achieving an optimal weight will improve Outcome: Progressing   Problem: Skin Integrity: Goal: Risk for impaired skin integrity will decrease Outcome: Progressing   Problem: Tissue Perfusion: Goal: Adequacy of tissue perfusion will improve Outcome: Progressing   Problem: Education: Goal: Knowledge of General Education information will improve Description: Including pain rating scale, medication(s)/side effects and non-pharmacologic comfort measures Outcome: Progressing   Problem: Clinical Measurements: Goal: Ability to maintain clinical measurements within normal limits will improve Outcome: Progressing Goal: Will remain free from infection Outcome: Progressing Goal: Diagnostic test results will improve Outcome: Progressing Goal: Respiratory  complications will improve Outcome: Progressing Goal: Cardiovascular complication will be avoided Outcome: Progressing   Problem: Clinical Measurements: Goal: Will remain free from infection Outcome: Progressing   Problem: Clinical Measurements: Goal: Diagnostic test results will improve Outcome: Progressing   Problem: Clinical Measurements: Goal: Respiratory complications will improve Outcome: Progressing   Problem: Clinical Measurements: Goal: Cardiovascular complication will be avoided Outcome: Progressing   Problem: Activity: Goal: Risk for activity intolerance will decrease Outcome: Progressing   Problem: Activity: Goal: Risk for activity intolerance will decrease Outcome: Progressing   Problem: Nutrition: Goal: Adequate nutrition will be maintained Outcome: Progressing   Problem: Nutrition: Goal: Adequate nutrition will be maintained Outcome: Progressing   Problem: Coping: Goal: Level of anxiety will decrease Outcome: Progressing   Problem: Elimination: Goal: Will not experience complications related to bowel motility Outcome: Progressing Goal: Will not experience complications related to urinary retention Outcome: Progressing   Problem: Pain Managment: Goal: General experience of comfort will improve Outcome: Progressing   Problem: Safety: Goal: Ability to remain free from injury will improve Outcome: Progressing

## 2022-10-18 NOTE — Plan of Care (Signed)

## 2022-10-19 DIAGNOSIS — C20 Malignant neoplasm of rectum: Secondary | ICD-10-CM | POA: Diagnosis not present

## 2022-10-19 DIAGNOSIS — G893 Neoplasm related pain (acute) (chronic): Secondary | ICD-10-CM | POA: Diagnosis not present

## 2022-10-19 DIAGNOSIS — I10 Essential (primary) hypertension: Secondary | ICD-10-CM | POA: Diagnosis not present

## 2022-10-19 DIAGNOSIS — E119 Type 2 diabetes mellitus without complications: Secondary | ICD-10-CM | POA: Diagnosis not present

## 2022-10-19 LAB — GLUCOSE, CAPILLARY
Glucose-Capillary: 120 mg/dL — ABNORMAL HIGH (ref 70–99)
Glucose-Capillary: 186 mg/dL — ABNORMAL HIGH (ref 70–99)
Glucose-Capillary: 104 mg/dL — ABNORMAL HIGH (ref 70–99)
Glucose-Capillary: 163 mg/dL — ABNORMAL HIGH (ref 70–99)

## 2022-10-19 NOTE — Progress Notes (Signed)
[facility]                                                                               Joseph Hogan, is a 62 y.o. 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 Patient noted to have been seen in the ED 10/11/2022 for ongoing right lower extremity pain, ultrasound obtained was negative for DVT with multiple lymph nodes in the right lower extremity and diffuse soft tissue edema of the right scrotum. Due to ongoing pain patient presented back to the ED. Patient admitted for pain management.    Assessment & Plan    Assessment and Plan:  Right lower extremity pain secondary to metastatic rectal cancer with metastasis to the bone/cancer associated pain -It is noted that patient had run out of his long-acting pain medications 3 to 4 days prior to admission and noted to have run out of his oxycodone breakthrough pain meds per admitting physician. -Patient noted to have had a recent lower extremity ultrasound during last ED visit 10/11/2022 which was negative for DVT. -Patient started back on home regimen of OxyContin 15 mg p.o. twice daily, IV Dilaudid for severe breakthrough pain. -changed oxycodone to hydrocodone for mod breakthrough pain, reports pain is better controlled .    2.  Hypokalemia Replaced. Repeat level wnl.    3.  Hypertension Well controlled. No changes in meds.    4.  Well-controlled diabetes mellitus type 2 -Hemoglobin A1c 5.6 (10/14/2022). CBG (last 3)  Recent Labs    10/19/22 0722 10/19/22 1136 10/19/22 1616  GLUCAP 120* 186* 104*   No changes in meds.    5.  Rectal carcinoma with metastasis to the bone -Will inform patient's primary  oncologist of admission via epic. -Continue current pain management. -??  Need for radiation therapy for pain management.    6.  Constipation -Likely secondary to opioid pain medication. -Place on MiraLAX twice daily, Senokot-S twice daily. -Dulcolax suppository ordered.  - Constipation resolved.    7.  Vitamin B12 deficiency -Vitamin B12 1000 mcg subcu daily x 7 days, and then weekly x 1 month, and then monthly. -Could likely transition to oral vitamin B12 supplementation on discharge.     8. Left shoulder pain:  Chronic , but worsening . Sharp pain in the left anterior aspect. Radiating.  Not well controlled with pain meds.  CT shoulder shows multiple sclerotic lesions compatible with mets.    Estimated body mass index is 29.57 kg/m as calculated from the following:   Height as of this encounter: 5\' 11"  (1.803 m).   Weight as of this encounter: 96.2 kg.  Code Status: full code.  DVT Prophylaxis:  enoxaparin (LOVENOX) injection 40 mg Start: 10/15/22 1800 SCDs Start: 10/15/22 0144   Level of Care: Level of care: Telemetry Family Communication:none at bedside.   Disposition Plan:  Remains inpatient appropriate:  pain control.   Procedures:  None.  Consultants:   Oncology Dr Leonides Schanz  Antimicrobials:   Anti-infectives (From admission, onward)    None        Medications  Scheduled Meds:  bisacodyl  10 mg Rectal Once   Chlorhexidine Gluconate Cloth  6 each Topical Daily   cyanocobalamin  1,000 mcg Subcutaneous Daily   enoxaparin (LOVENOX) injection  40 mg Subcutaneous Q24H   gabapentin  300 mg Oral QHS   insulin aspart  0-15 Units Subcutaneous TID WC   insulin aspart  0-5 Units Subcutaneous QHS   lidocaine-prilocaine   Topical Once   oxyCODONE  15 mg Oral Q12H   pantoprazole  20 mg Oral Daily   polyethylene glycol  17 g Oral BID   senna-docusate  1 tablet Oral BID   Continuous Infusions:  methocarbamol (ROBAXIN) IV     PRN Meds:.acetaminophen  **OR** acetaminophen, antiseptic oral rinse, HYDROcodone-acetaminophen, HYDROmorphone (DILAUDID) injection, menthol-cetylpyridinium, methocarbamol (ROBAXIN) IV, ondansetron **OR** ondansetron (ZOFRAN) IV, polyethylene glycol    Subjective:   Joseph Hogan was seen and examined today. Shoulder pain improved.   Objective:   Vitals:   10/18/22 1430 10/18/22 2024 10/19/22 0336 10/19/22 1323  BP: (!) 141/74 137/70 139/72 (!) 141/72  Pulse: 78 77 82 72  Resp: 16 18 18 18   Temp: 99.7 F (37.6 C) 98.3 F (36.8 C) 99.1 F (37.3 C) 97.9 F (36.6 C)  TempSrc: Oral Oral Oral Oral  SpO2: 97% 100% 96% 94%  Weight:      Height:       No intake or output data in the 24 hours ending 10/19/22 1701  Filed Weights   10/14/22 1527  Weight: 96.2 kg     Exam General exam: Appears calm and comfortable  Respiratory system: Clear to auscultation. Respiratory effort normal. Cardiovascular system: S1 & S2 heard, RRR. No JVD,  Gastrointestinal system: Abdomen is nondistended, soft and nontender.  Central nervous system: Alert and oriented. No focal neurological deficits. Extremities: Symmetric 5 x 5 power. Skin: No rashes,  Psychiatry: Mood & affect appropriate.       Data Reviewed:  I have personally reviewed following labs and imaging studies   CBC Lab Results  Component Value Date   WBC 4.3 10/16/2022   RBC 3.05 (L) 10/16/2022   HGB 9.0 (L) 10/16/2022   HCT 28.0 (L) 10/16/2022   MCV 91.8 10/16/2022   MCH 29.5 10/16/2022   PLT 172 10/16/2022   MCHC 32.1 10/16/2022   RDW 16.3 (H) 10/16/2022   LYMPHSABS 0.4 (L) 10/16/2022   MONOABS 0.7 10/16/2022   EOSABS 0.1 10/16/2022   BASOSABS 0.0 10/16/2022     Last metabolic panel Lab Results  Component Value Date   NA 133 (L) 10/16/2022   K 3.7 10/16/2022   CL 102 10/16/2022   CO2 22 10/16/2022   BUN 19 10/16/2022   CREATININE 0.62 10/16/2022   GLUCOSE 120 (H) 10/16/2022   GFRNONAA >60 10/16/2022   GFRAA >60 10/13/2019    CALCIUM 8.1 (L) 10/16/2022   PHOS 4.2 10/16/2022   PROT 6.5 10/15/2022   ALBUMIN 2.8 (L) 10/16/2022   LABGLOB 3.5 02/06/2022   AGRATIO 0.9 02/04/2022   BILITOT 0.9 10/15/2022   ALKPHOS 176 (H) 10/15/2022   AST 37 10/15/2022   ALT 13 10/15/2022   ANIONGAP 9 10/16/2022    CBG (last 3)  Recent Labs    10/19/22 0722 10/19/22 1136 10/19/22 1616  GLUCAP 120* 186* 104*  Coagulation Profile: No results for input(s): "INR", "PROTIME" in the last 168 hours.   Radiology Studies: CT SHOULDER LEFT WO CONTRAST  Result Date: 10/19/2022 CLINICAL DATA:  Shoulder pain, chronic, no prior imaging shoulder pain. History of metastatic rectal cancer EXAM: CT OF THE UPPER LEFT EXTREMITY WITHOUT CONTRAST TECHNIQUE: Multidetector CT imaging of the upper left extremity was performed according to the standard protocol. RADIATION DOSE REDUCTION: This exam was performed according to the departmental dose-optimization program which includes automated exposure control, adjustment of the mA and/or kV according to patient size and/or use of iterative reconstruction technique. COMPARISON:  X-ray 01/27/2020 FINDINGS: Bones/Joint/Cartilage Multiple mixed lytic and sclerotic bone lesions are present. Large somewhat permeative lesion within the left humeral head and neck measures approximally 5.0 cm in size (series 6, image 78). No pathologic fracture. Additional lesions include permeative lesion in the medial aspect of the scapular body, lateral left second rib lesion, and lateral left third rib lesion. The lateral left third rib lesion is expansile and measures approximately 4 x 2 cm in size. Probable lesion within the T4 vertebral body. Glenohumeral joint alignment is maintained without dislocation. Humeral head is high-riding and abuts the undersurface of the acromion. 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 Small glenohumeral joint effusion. Ligaments Suboptimally assessed by CT.  Muscles and Tendons Supraspinatus and infraspinatus muscle atrophy compatible with chronic underlying rotator cuff tear. The teres minor muscle is also atrophic. Soft tissues No fluid collection or hematoma.  No left axillary lymphadenopathy. IMPRESSION: 1. Multiple mixed lytic and sclerotic bone lesions, compatible with known osseous metastatic disease. Large somewhat permeative lesion within the left humeral head and neck measures approximally 5.0 cm in size. No pathologic fracture. 2. Additional metastatic lesions include the left scapula, left second rib, left third rib, and T4 vertebral body. 3. 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 4. Findings compatible with chronic underlying rotator cuff tears. Electronically Signed   By: Duanne Guess D.O.   On: 10/19/2022 12:41       Kathlen Mody M.D. [facility] 10/19/2022, 5:01 PM  Available via Epic secure chat 7am-7pm After 7 pm, please refer to night coverage provider listed on amion.

## 2022-10-19 NOTE — Progress Notes (Signed)
Mobility Specialist - Progress Note   10/19/22 1449  Mobility  Activity Ambulated with assistance in hallway  Level of Assistance Standby assist, set-up cues, supervision of patient - no hands on  Assistive Device Front wheel walker  Distance Ambulated (ft) 40 ft  Activity Response Tolerated well  Mobility Referral Yes  $Mobility charge 1 Mobility  Mobility Specialist Start Time (ACUTE ONLY) 0210  Mobility Specialist Stop Time (ACUTE ONLY) 0231  Mobility Specialist Time Calculation (min) (ACUTE ONLY) 21 min   Pt received in bed and agreeable to mobility. No complaints during session. Pt to bed after session with all needs met.    [facility]

## 2022-10-19 NOTE — Plan of Care (Signed)

## 2022-10-20 DIAGNOSIS — G893 Neoplasm related pain (acute) (chronic): Secondary | ICD-10-CM | POA: Diagnosis not present

## 2022-10-20 DIAGNOSIS — C20 Malignant neoplasm of rectum: Secondary | ICD-10-CM | POA: Diagnosis not present

## 2022-10-20 DIAGNOSIS — E119 Type 2 diabetes mellitus without complications: Secondary | ICD-10-CM | POA: Diagnosis not present

## 2022-10-20 DIAGNOSIS — I10 Essential (primary) hypertension: Secondary | ICD-10-CM | POA: Diagnosis not present

## 2022-10-20 LAB — GLUCOSE, CAPILLARY
Glucose-Capillary: 130 mg/dL — ABNORMAL HIGH (ref 70–99)
Glucose-Capillary: 132 mg/dL — ABNORMAL HIGH (ref 70–99)
Glucose-Capillary: 129 mg/dL — ABNORMAL HIGH (ref 70–99)
Glucose-Capillary: 141 mg/dL — ABNORMAL HIGH (ref 70–99)

## 2022-10-20 MED ORDER — VITAMIN B-12 1000 MCG PO TABS: 1000.0000 ug | ORAL_TABLET | Freq: Every day | ORAL | 1 refills | Status: DC

## 2022-10-20 MED ORDER — PANTOPRAZOLE SODIUM 20 MG PO TBEC: 20.0000 mg | DELAYED_RELEASE_TABLET | Freq: Every day | ORAL | 0 refills | Status: DC

## 2022-10-20 MED ORDER — HYDROCODONE-ACETAMINOPHEN 5-325 MG PO TABS: 1.0000 | ORAL_TABLET | Freq: Four times a day (QID) | ORAL | 0 refills | Status: DC | PRN

## 2022-10-20 MED ORDER — SENNOSIDES-DOCUSATE SODIUM 8.6-50 MG PO TABS: 1.0000 | ORAL_TABLET | Freq: Two times a day (BID) | ORAL | 0 refills | Status: DC

## 2022-10-20 MED ORDER — POLYETHYLENE GLYCOL 3350 17 G PO PACK: 17.0000 g | PACK | Freq: Every day | ORAL | 0 refills | Status: DC | PRN

## 2022-10-20 NOTE — TOC Progression Note (Signed)
Transition of Care [facility]) - Progression Note    Patient Details  Name: Joseph Hogan MRN: [id number] Date of Birth: October 26, 1960  Transition of Care [facility] CM/SW Contact  Georgie Chard, [place] Phone Number: 10/20/2022, 1:54 PM  Clinical Narrative:    CSW spoke to patient's daughter at this time the daughter stated that she can pick the patient up tomorrow after she leaves work at 10 am. Daughter reports living 2 min's away. AT this time there are no current TOC needs. However if new needs arise please place consult. Daughter's number [phone]   Expected Discharge Plan: Home w Home Health Services Barriers to Discharge: Continued Medical Work up  Expected Discharge Plan and Services       Living arrangements for the past 2 months: Single Family Home Expected Discharge Date: 10/20/22                                     Social Determinants of Health (SDOH) Interventions SDOH Screenings   Food Insecurity: No Food Insecurity (10/15/2022)  Housing: Low Risk  (10/15/2022)  Transportation Needs: No Transportation Needs (10/15/2022)  Utilities: Not At Risk (10/15/2022)  Tobacco Use: Low Risk  (10/14/2022)    Readmission Risk Interventions    10/17/2022   12:12 PM  Readmission Risk Prevention Plan  Transportation Screening Complete  Medication Review [facility]) Complete  PCP or Specialist appointment within 3-5 days of discharge Complete  HRI or Home Care Consult Complete  SW Recovery Care/Counseling Consult Complete  Palliative Care Screening Not Applicable  Skilled Nursing Facility Not Applicable

## 2022-10-20 NOTE — Plan of Care (Signed)
  Problem: Pain Managment: Goal: General experience of comfort will improve Outcome: Progressing   Problem: Safety: Goal: Ability to remain free from injury will improve Outcome: Progressing   

## 2022-10-20 NOTE — Plan of Care (Signed)

## 2022-10-20 NOTE — Progress Notes (Signed)
[facility]                                                                               Joseph Hogan, is a 62 y.o. 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 Patient noted to have been seen in the ED 10/11/2022 for ongoing right lower extremity pain, ultrasound obtained was negative for DVT with multiple lymph nodes in the right lower extremity and diffuse soft tissue edema of the right scrotum. Due to ongoing pain patient presented back to the ED. Patient admitted for pain management.    Assessment & Plan    Assessment and Plan:  Right lower extremity pain secondary to metastatic rectal cancer with metastasis to the bone/cancer associated pain -It is noted that patient had run out of his long-acting pain medications 3 to 4 days prior to admission and noted to have run out of his oxycodone breakthrough pain meds per admitting physician. -Patient noted to have had a recent lower extremity ultrasound during last ED visit 10/11/2022 which was negative for DVT. -Patient started back on home regimen of OxyContin 15 mg p.o. twice daily, IV Dilaudid for severe breakthrough pain. -changed oxycodone to hydrocodone for mod breakthrough pain, reports pain is better controlled .  He is stable for discharge, does not have family available to take him home and care for him. Daughter to pick him up at 10 am tomorrow.    2.  Hypokalemia Replaced. Repeat level wnl.    3.  Hypertension Well controlled. No changes in meds.    4.  Well-controlled diabetes mellitus type 2 -Hemoglobin A1c 5.6 (10/14/2022). CBG (last 3)  Recent Labs    10/19/22 2111 10/20/22 0729 10/20/22 1158   GLUCAP 163* 130* 132*   No changes in meds.    5.  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 -Continue current pain management. -??  Need for radiation therapy for pain management.    6.  Constipation -Likely secondary to opioid pain medication. -Place on MiraLAX twice daily, Senokot-S twice daily. -Dulcolax suppository ordered.  - Constipation resolved.    7.  Vitamin B12 deficiency -Vitamin B12 1000 mcg subcu daily x 7 days, and then weekly x 1 month, and then monthly. -Could likely transition to oral vitamin B12 supplementation on discharge.     8. Left shoulder pain:  Chronic , but worsening . Sharp pain in the left anterior aspect. Radiating.  Not well controlled with pain meds.  CT shoulder shows multiple sclerotic lesions compatible with mets.  Pain regimen adjusted and left shoulder pain is tolerated.    Estimated body mass index is 29.57 kg/m as calculated from the following:   Height as of this encounter: 5\' 11"  (1.803 m).   Weight as of this encounter: 96.2 kg.  Code Status:  full code.  DVT Prophylaxis:  enoxaparin (LOVENOX) injection 40 mg Start: 10/15/22 1800 SCDs Start: 10/15/22 0144   Level of Care: Level of care: Telemetry Family Communication:none at bedside.   Disposition Plan:     Remains inpatient appropriate:  pain control.   Procedures:  None.  Consultants:   Oncology Dr Leonides Schanz  Antimicrobials:   Anti-infectives (From admission, onward)    None        Medications  Scheduled Meds:  bisacodyl  10 mg Rectal Once   Chlorhexidine Gluconate Cloth  6 each Topical Daily   cyanocobalamin  1,000 mcg Subcutaneous Daily   enoxaparin (LOVENOX) injection  40 mg Subcutaneous Q24H   gabapentin  300 mg Oral QHS   insulin aspart  0-15 Units Subcutaneous TID WC   insulin aspart  0-5 Units Subcutaneous QHS   lidocaine-prilocaine   Topical Once   oxyCODONE  15 mg Oral Q12H    pantoprazole  20 mg Oral Daily   polyethylene glycol  17 g Oral BID   senna-docusate  1 tablet Oral BID   Continuous Infusions:  methocarbamol (ROBAXIN) IV 500 mg (10/20/22 0229)   PRN Meds:.acetaminophen **OR** acetaminophen, antiseptic oral rinse, HYDROcodone-acetaminophen, HYDROmorphone (DILAUDID) injection, menthol-cetylpyridinium, methocarbamol (ROBAXIN) IV, ondansetron **OR** ondansetron (ZOFRAN) IV, polyethylene glycol    Subjective:   Joseph Hogan was seen and examined today. No new complaints. Stable for discharge, family to pick him up in am.    Objective:   Vitals:   10/19/22 0336 10/19/22 1323 10/19/22 2109 10/20/22 1405  BP: 139/72 (!) 141/72 120/63 129/81  Pulse: 82 72 84 80  Resp: 18 18 14 18   Temp: 99.1 F (37.3 C) 97.9 F (36.6 C) 99.2 F (37.3 C) 98.2 F (36.8 C)  TempSrc: Oral Oral Oral Oral  SpO2: 96% 94% 95% 98%  Weight:      Height:        Intake/Output Summary (Last 24 hours) at 10/20/2022 1547 Last data filed at 10/20/2022 1100 Gross per 24 hour  Intake 535 ml  Output --  Net 535 ml    Filed Weights   10/14/22 1527  Weight: 96.2 kg     Exam General exam: Appears calm and comfortable  Respiratory system: Clear to auscultation. Respiratory effort normal. Cardiovascular system: S1 & S2 heard, RRR. No JVD,  Gastrointestinal system: Abdomen is nondistended, soft and nontender.  Central nervous system: Alert and oriented. No focal neurological deficits. Extremities: Symmetric 5 x 5 power. Skin: No rashes, lesions or ulcers Psychiatry: Mood & affect appropriate.        Data Reviewed:  I have personally reviewed following labs and imaging studies   CBC Lab Results  Component Value Date   WBC 4.3 10/16/2022   RBC 3.05 (L) 10/16/2022   HGB 9.0 (L) 10/16/2022   HCT 28.0 (L) 10/16/2022   MCV 91.8 10/16/2022   MCH 29.5 10/16/2022   PLT 172 10/16/2022   MCHC 32.1 10/16/2022   RDW 16.3 (H) 10/16/2022   LYMPHSABS 0.4 (L) 10/16/2022    MONOABS 0.7 10/16/2022   EOSABS 0.1 10/16/2022   BASOSABS 0.0 10/16/2022     Last metabolic panel Lab Results  Component Value Date   NA 133 (L) 10/16/2022   K 3.7 10/16/2022   CL 102 10/16/2022   CO2 22 10/16/2022   BUN 19 10/16/2022   CREATININE 0.62 10/16/2022   GLUCOSE 120 (H) 10/16/2022   GFRNONAA >60 10/16/2022   GFRAA >60 10/13/2019   CALCIUM 8.1 (L) 10/16/2022  PHOS 4.2 10/16/2022   PROT 6.5 10/15/2022   ALBUMIN 2.8 (L) 10/16/2022   LABGLOB 3.5 02/06/2022   AGRATIO 0.9 02/04/2022   BILITOT 0.9 10/15/2022   ALKPHOS 176 (H) 10/15/2022   AST 37 10/15/2022   ALT 13 10/15/2022   ANIONGAP 9 10/16/2022    CBG (last 3)  Recent Labs    10/19/22 2111 10/20/22 0729 10/20/22 1158  GLUCAP 163* 130* 132*      Coagulation Profile: No results for input(s): "INR", "PROTIME" in the last 168 hours.   Radiology Studies: CT SHOULDER LEFT WO CONTRAST  Result Date: 10/19/2022 CLINICAL DATA:  Shoulder pain, chronic, no prior imaging shoulder pain. History of metastatic rectal cancer EXAM: CT OF THE UPPER LEFT EXTREMITY WITHOUT CONTRAST TECHNIQUE: Multidetector CT imaging of the upper left extremity was performed according to the standard protocol. RADIATION DOSE REDUCTION: This exam was performed according to the departmental dose-optimization program which includes automated exposure control, adjustment of the mA and/or kV according to patient size and/or use of iterative reconstruction technique. COMPARISON:  X-ray 01/27/2020 FINDINGS: Bones/Joint/Cartilage Multiple mixed lytic and sclerotic bone lesions are present. Large somewhat permeative lesion within the left humeral head and neck measures approximally 5.0 cm in size (series 6, image 78). No pathologic fracture. Additional lesions include permeative lesion in the medial aspect of the scapular body, lateral left second rib lesion, and lateral left third rib lesion. The lateral left third rib lesion is expansile and measures  approximately 4 x 2 cm in size. Probable lesion within the T4 vertebral body. Glenohumeral joint alignment is maintained without dislocation. Humeral head is high-riding and abuts the undersurface of the acromion. 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 Small glenohumeral joint effusion. Ligaments Suboptimally assessed by CT. Muscles and Tendons Supraspinatus and infraspinatus muscle atrophy compatible with chronic underlying rotator cuff tear. The teres minor muscle is also atrophic. Soft tissues No fluid collection or hematoma.  No left axillary lymphadenopathy. IMPRESSION: 1. Multiple mixed lytic and sclerotic bone lesions, compatible with known osseous metastatic disease. Large somewhat permeative lesion within the left humeral head and neck measures approximally 5.0 cm in size. No pathologic fracture. 2. Additional metastatic lesions include the left scapula, left second rib, left third rib, and T4 vertebral body. 3. 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 4. Findings compatible with chronic underlying rotator cuff tears. Electronically Signed   By: Duanne Guess D.O.   On: 10/19/2022 12:41       Kathlen Mody M.D. [facility] 10/20/2022, 3:47 PM  Available via Epic secure chat 7am-7pm After 7 pm, please refer to night coverage provider listed on amion.

## 2022-10-21 DIAGNOSIS — E119 Type 2 diabetes mellitus without complications: Secondary | ICD-10-CM | POA: Diagnosis not present

## 2022-10-21 DIAGNOSIS — I10 Essential (primary) hypertension: Secondary | ICD-10-CM | POA: Diagnosis not present

## 2022-10-21 DIAGNOSIS — G893 Neoplasm related pain (acute) (chronic): Secondary | ICD-10-CM | POA: Diagnosis not present

## 2022-10-21 DIAGNOSIS — C20 Malignant neoplasm of rectum: Secondary | ICD-10-CM | POA: Diagnosis not present

## 2022-10-21 LAB — GLUCOSE, CAPILLARY: Glucose-Capillary: 139 mg/dL — ABNORMAL HIGH (ref 70–99)

## 2022-10-21 MED ORDER — HEPARIN SOD (PORK) LOCK FLUSH 100 UNIT/ML IV SOLN
500.0000 [IU] | Freq: Once | INTRAVENOUS | Status: AC
  Administered 2022-10-21: 500 [IU] via INTRAVENOUS
  Filled 2022-10-21: qty 5

## 2022-10-21 NOTE — Plan of Care (Addendum)
Alert and oriented x 4. Free of falls and other injuries. Vital signs are stable. Awaiting discharge. No questions or concerns at this time. Port deaccessed with Normal saline and heparin flush given.   Problem: Coping: Goal: Ability to adjust to condition or change in health will improve Outcome: Adequate for Discharge   Problem: Fluid Volume: Goal: Ability to maintain a balanced intake and output will improve Outcome: Adequate for Discharge   Problem: Health Behavior/Discharge Planning: Goal: Ability to identify and utilize available resources and services will improve Outcome: Adequate for Discharge

## 2022-10-21 NOTE — Progress Notes (Signed)
Physical Therapy Treatment Patient Details Name: Joseph Hogan MRN: [id number] DOB: 03-Oct-1960 Today's Date: 10/21/2022   History of Present Illness 62 yo male presents to therapy s/p hospital admission on 10/14/2022 secondary to B LE pain R > L attributed to metastatic rectal ca. CT of chest and abdomen revealed rib and L pelvic lesions and in addition a moderate volume of stool.  Pt presented to [facility] ED on 10/11/2022 due to severe pain. Pt PMH includes but is not limited to: DM II, HTN, HLD, rectal ca, fall, L femur fx and THA, posterior lateral approach 02/06/2022.    PT Comments   Pt admitted with above diagnosis.  Pt currently with functional limitations due to the deficits listed below (see PT Problem List). Pt seated in recliner when PT arrived. Pt reported that he has a new onset of L shoulder pain and is requesting imaging. Pt reported need to void bladder and motivated to amb to the bathroom. Pt required mod A for sit to stand  from recliner with increased time and cues, once in standing CGA and B UE support at [facility] and pt indicated there is no way he could make it to the bathroom and wanted to go to bed and use urinal. pt amb 6 feet with RW, CGA, shuffling pattern, with wide BOS and B knee and hip flexion with slow cadence to bed. Pt required min A for B LE for sit to supine. Pt left in bed all needs in place Pt will benefit from acute skilled PT to increase their independence and safety with mobility to allow discharge.      If plan is discharge home, recommend the following: A little help with walking and/or transfers;A little help with bathing/dressing/bathroom;Assistance with cooking/housework;Assist for transportation;Help with stairs or ramp for entrance   Can travel by private vehicle        Equipment Recommendations  Rolling walker (2 wheels)    Recommendations for Other Services       Precautions / Restrictions Precautions Precautions: Fall Restrictions Weight Bearing  Restrictions: No     Mobility  Bed Mobility Overal bed mobility: Needs Assistance Bed Mobility: Sit to Supine     Supine to sit: Min assist, HOB elevated, Used rails Sit to supine: Max assist   General bed mobility comments: pt requested HOB be slightly elevated, pt required increased time and max A for B LE with minimal initation of movement to assist into bed    Transfers Overall transfer level: Needs assistance Equipment used: Rolling walker (2 wheels) Transfers: Sit to/from Stand Sit to Stand: Mod assist (recliner)           General transfer comment: pt is limiting L UE WB to assist to push to stand from recliner, pt required increased time to perform task with cues for encouragement    Ambulation/Gait Ambulation/Gait assistance: Contact guard assist Gait Distance (Feet): 6 Feet Assistive device: Rolling walker (2 wheels) Gait Pattern/deviations: Step-to pattern, Shuffle, Knee flexed in stance - right, Knee flexed in stance - left, Antalgic, Wide base of support Gait velocity: decreased     General Gait Details: B knee flexion through out gait cycle, absent L heel stride, wide BOS with B toe out. pt indicated need to void bladder then decided he would be unable to make it to the bathroom then back to bed pt stated " I know my limitations" and requested assist to bed and use of urinal. pt reported increased L shoulder pain with gait  tasks. pt sates he is awating imaging for L shoulder at time of intervention no imaging orders pending   Stairs             Wheelchair Mobility     Tilt Bed    Modified Rankin (Stroke Patients Only)       Balance Overall balance assessment: Needs assistance, History of Falls Sitting-balance support: Feet supported Sitting balance-Leahy Scale: Fair     Standing balance support: Bilateral upper extremity supported, During functional activity, Reliant on assistive device for balance Standing balance-Leahy Scale: Poor                               Cognition Arousal: Alert Behavior During Therapy: WFL for tasks assessed/performed Overall Cognitive Status: Within Functional Limits for tasks assessed                                          Exercises      General Comments        Pertinent Vitals/Pain Pain Assessment Pain Assessment: 0-10 Faces Pain Scale: Hurts whole lot Pain Location: B  LE  R > L and L shoulder Pain Descriptors / Indicators: Aching, Burning, Dull, Grimacing, Stabbing, Shooting, Sharp Pain Intervention(s): Limited activity within patient's tolerance, Monitored during session, Premedicated before session, Repositioned    Home Living Family/patient expects to be discharged to:: Private residence Living Arrangements: Alone Available Help at Discharge: Family;Friend(s) Type of Home: House Home Access: Level entry Entrance Stairs-Rails: None Entrance Stairs-Number of Steps: 2+1 (pt may have ramp in place at time of dc)   Home Layout: One level Home Equipment: Agricultural consultant (2 wheels);Rollator (4 wheels);Shower seat;Transport chair      Prior Function            PT Goals (current goals can now be found in the care plan section) Acute Rehab PT Goals Patient Stated Goal: to be able to walk no pain and transition to cane PT Goal Formulation: With patient Time For Goal Achievement: 10/30/22 Potential to Achieve Goals: Good Progress towards PT goals: Progressing toward goals    Frequency    Min 1X/week      PT Plan      Co-evaluation              AM-PAC PT "6 Clicks" Mobility   Outcome Measure  Help needed turning from your back to your side while in a flat bed without using bedrails?: A Little Help needed moving from lying on your back to sitting on the side of a flat bed without using bedrails?: A Lot Help needed moving to and from a bed to a chair (including a wheelchair)?: A Lot Help needed standing up from a chair using your  arms (e.g., wheelchair or bedside chair)?: A Lot Help needed to walk in hospital room?: A Little Help needed climbing 3-5 steps with a railing? : Total 6 Click Score: 13    End of Session Equipment Utilized During Treatment: Gait belt Activity Tolerance: Patient limited by pain;Patient limited by fatigue Patient left: in chair;with call bell/phone within reach Nurse Communication: Mobility status PT Visit Diagnosis: Unsteadiness on feet (R26.81);Other abnormalities of gait and mobility (R26.89);Muscle weakness (generalized) (M62.81);History of falling (Z91.81);Difficulty in walking, not elsewhere classified (R26.2);Other (comment)     Time: 9528-4132 PT Time Calculation (min) (ACUTE ONLY): 19  min  Charges:    $Therapeutic Activity: 8-22 mins PT General Charges $$ ACUTE PT VISIT: 1 Visit                     Johnny Bridge, PT Acute Rehab    Jacqualyn Posey 10/18/2022, 1240 PM

## 2022-10-23 ENCOUNTER — Other Ambulatory Visit: Payer: Self-pay | Admitting: *Deleted

## 2022-10-23 ENCOUNTER — Other Ambulatory Visit: Payer: Self-pay | Admitting: Hematology and Oncology

## 2022-10-25 ENCOUNTER — Encounter: Payer: Self-pay | Admitting: Hematology and Oncology

## 2022-10-25 ENCOUNTER — Telehealth: Payer: Self-pay | Admitting: *Deleted

## 2022-10-25 ENCOUNTER — Other Ambulatory Visit: Payer: Self-pay | Admitting: Hematology and Oncology

## 2022-10-25 DIAGNOSIS — C7951 Secondary malignant neoplasm of bone: Secondary | ICD-10-CM

## 2022-10-25 DIAGNOSIS — C20 Malignant neoplasm of rectum: Secondary | ICD-10-CM

## 2022-10-25 MED ORDER — OXYCODONE HCL 5 MG PO TABS: 5.0000 mg | ORAL_TABLET | Freq: Four times a day (QID) | ORAL | 0 refills | Status: DC | PRN

## 2022-10-25 NOTE — Telephone Encounter (Signed)
Received call from pt. He states he had been in the hospital recently for pain control. He was discharged with Xtampza and hydrocodone. There hydrocodone has not been helping the breakthrough pain very much. Pt is asking for recommendations. Advised that I would discuss with Dr. Leonides Schanz but likely we will substitute Oxycodone for the hydrocodone. Pt is agreeable to this.  Dr. Leonides Schanz has ordered the oxycodone. Spoke with pt. Advised to not take anymore hydrocodone and to take just 1 tablet of the oxycodone as needed for pain.  Also will make referral to Palliative Care with Nolon Bussing, NP for ongoing pain management. Pt voiced understanding to the above.

## 2022-10-25 NOTE — Telephone Encounter (Signed)
Received vm message from Lindsay. Pt is to start home PT with [facility]urb[facility] but pt is delaying start of care until 11/01/22 when a specific physical Therapist is available  No need for call back.Marland Kitchen

## 2022-10-27 NOTE — Discharge Summary (Signed)
Physician Discharge Summary   Patient: Joseph Hogan MRN: [id number] DOB: 04-05-1960  Admit date:     10/14/2022  Discharge date: 10/21/2022  Discharge Physician: Kathlen Mody   PCP: [name], MD   Recommendations at discharge:  Please follow up with PCP and oncology as recommended.   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 Patient noted to have been seen in the ED 10/11/2022 for ongoing right lower extremity pain, ultrasound obtained was negative for DVT with multiple lymph nodes in the right lower extremity and diffuse soft tissue edema of the right scrotum. Due to ongoing pain patient presented back to the ED. Patient admitted for pain management.   Assessment and Plan:   Right lower extremity pain secondary to metastatic rectal cancer with metastasis to the bone/cancer associated pain -It is noted that patient had run out of his long-acting pain medications 3 to 4 days prior to admission and noted to have run out of his oxycodone breakthrough pain meds per admitting physician. -Patient noted to have had a recent lower extremity ultrasound during last ED visit 10/11/2022 which was negative for DVT. -Patient started back on home regimen of OxyContin 15 mg p.o. twice daily, IV Dilaudid for severe breakthrough pain. IV dilaudid discontinued later on.  -changed oxycodone to hydrocodone for mod breakthrough pain, reports pain is better controlled .  He is stable for discharge, does not have family available to take him home and care for  him. Daughter to pick him up at 10 am    2.  Hypokalemia Replaced. Repeat level wnl.    3.  Hypertension Well controlled. No changes in meds.    4.  Well-controlled diabetes mellitus type 2 -Hemoglobin A1c 5.6 (10/14/2022).  No changes in meds.    5.  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 -Continue current pain management. -??  Need for radiation therapy for pain management.     6.  Constipation -Likely secondary to opioid pain medication. -Place on MiraLAX twice daily, Senokot-S twice daily. -Dulcolax suppository ordered.  - Constipation resolved.    7.  Vitamin B12 deficiency -Vitamin B12 1000 mcg subcu daily x 7 days, and then weekly x 1 month, and then monthly. -Could likely transition to oral vitamin B12 supplementation on discharge.     8. Left shoulder pain:  Chronic , but worsening . Sharp pain in the left anterior aspect. Radiating.  Not well controlled with pain meds.  CT shoulder shows multiple sclerotic lesions compatible with mets.  Pain regimen adjusted and left shoulder pain is tolerated.      Estimated body mass index is 29.57 kg/m as calculated from the following:   Height as of this encounter: 5\' 11"  (1.803 m).   Weight as of this encounter: 96.2 kg.     Consultants: oncology. Procedures performed: none.   Disposition: Home Diet recommendation:  Discharge Diet Orders (From admission, onward)     Start     Ordered   10/21/22 0000  Diet - low sodium heart healthy  10/21/22 0826   10/20/22 0000  Diet - low sodium heart healthy        10/20/22 1154           Regular diet DISCHARGE MEDICATION: Allergies as of 10/21/2022       Reactions   Codeine Nausea And Vomiting   Lisinopril Cough        Medication List     STOP taking these medications    Clotrimazole 1 % Lotn   doxycycline 100 MG tablet Commonly known as: VIBRA-TABS   hydrocortisone 2.5 %  cream   oxyCODONE 5 MG immediate release tablet Commonly known as: Oxy IR/ROXICODONE   predniSONE 5 MG tablet Commonly known as: DELTASONE   prochlorperazine 10 MG tablet Commonly known as: COMPAZINE   tiZANidine 4 MG tablet Commonly known as: Zanaflex       TAKE these medications    cyanocobalamin 1000 MCG tablet Commonly known as: VITAMIN B12 Take 1 tablet (1,000 mcg total) by mouth daily.   gabapentin 300 MG capsule Commonly known as: NEURONTIN Take 1 capsule (300 mg total) by mouth at bedtime.   lidocaine-prilocaine cream Commonly known as: EMLA Apply a quarter-sized amount to port a cath site and cover with plastic wrap one hour prior to infusion appointments   ondansetron 8 MG tablet Commonly known as: ZOFRAN Take 2 tablets (16 mg total) by mouth every 8 (eight) hours as needed for nausea or vomiting.   pantoprazole 20 MG tablet Commonly known as: Protonix Take 1 tablet (20 mg total) by mouth daily.   polyethylene glycol 17 g packet Commonly known as: MIRALAX / GLYCOLAX Take 17 g by mouth daily as needed for mild constipation.   senna-docusate 8.6-50 MG tablet Commonly known as: Senokot-S Take 1 tablet by mouth 2 (two) times daily.        Follow-up Information     [name], MD. Schedule an appointment as soon as possible for a visit in 1 week(s).   Specialty: Hematology and Oncology Contact information: [address] [place] [address] [phone]                Discharge Exam: Ceasar Mons Weights   10/14/22 1527  Weight: 96.2 kg   General exam: Appears calm and comfortable  Respiratory system: Clear to auscultation. Respiratory effort normal. Cardiovascular system: S1 & S2 heard, RRR.  Gastrointestinal system: Abdomen is nondistended, soft and nontender.  Central nervous system: Alert and oriented.  Extremities: Symmetric 5 x 5 power. Skin: No rashes, Psychiatry: Mood & affect appropriate.    Condition at discharge:  fair  The results of significant diagnostics from this hospitalization (including imaging, microbiology, ancillary and laboratory) are listed below for reference.   Imaging Studies: CT SHOULDER LEFT WO CONTRAST  Result Date: 10/19/2022 CLINICAL DATA:  Shoulder pain, chronic, no prior imaging shoulder pain. History of metastatic rectal cancer EXAM: CT OF THE UPPER LEFT EXTREMITY WITHOUT CONTRAST TECHNIQUE: Multidetector CT imaging of the upper left extremity was performed according to the standard protocol. RADIATION DOSE REDUCTION: This exam was performed according to the departmental dose-optimization program which includes automated exposure control, adjustment of the mA and/or kV according to patient size and/or use of iterative reconstruction technique. COMPARISON:  X-ray 01/27/2020 FINDINGS: Bones/Joint/Cartilage Multiple mixed lytic and sclerotic bone lesions are present. Large somewhat permeative lesion within the left humeral head and neck measures approximally 5.0 cm in size (series 6, image 78). No pathologic fracture. Additional lesions include permeative lesion in the  medial aspect of the scapular body, lateral left second rib lesion, and lateral left third rib lesion. The lateral left third rib lesion is expansile and measures approximately 4 x 2 cm in size. Probable lesion within the T4 vertebral body. Glenohumeral joint alignment is maintained without dislocation. Humeral head is high-riding and abuts the undersurface of the acromion. 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 Small glenohumeral joint effusion. Ligaments Suboptimally assessed by CT. Muscles and Tendons Supraspinatus and infraspinatus muscle atrophy compatible with chronic underlying rotator cuff tear. The teres minor muscle is also atrophic. Soft tissues No fluid collection or hematoma.  No left axillary lymphadenopathy. IMPRESSION: 1. Multiple mixed lytic and sclerotic bone lesions, compatible with  known osseous metastatic disease. Large somewhat permeative lesion within the left humeral head and neck measures approximally 5.0 cm in size. No pathologic fracture. 2. Additional metastatic lesions include the left scapula, left second rib, left third rib, and T4 vertebral body. 3. 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 4. Findings compatible with chronic underlying rotator cuff tears. Electronically Signed   By: Duanne Guess D.O.   On: 10/19/2022 12:41   CT CHEST ABDOMEN PELVIS W CONTRAST  Result Date: 10/14/2022 CLINICAL DATA:  Colorectal carcinoma staging. Bilateral leg pain which is severe. 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 * Tracking Code: BO * EXAM: CT CHEST, ABDOMEN, AND PELVIS WITH CONTRAST TECHNIQUE: Multidetector CT imaging of the chest, abdomen and pelvis was performed following the standard protocol during bolus administration of intravenous contrast. RADIATION DOSE REDUCTION: This exam was performed according to the departmental dose-optimization program which includes automated exposure control, adjustment of the mA and/or kV according to patient size and/or use of iterative reconstruction technique. CONTRAST:  OMNIPAQUE IOHEXOL 300 MG/ML  SOLN COMPARISON:  CT pelvis 07/23/2022, PET-CT scan 07/11/2022 FINDINGS: CT CHEST FINDINGS CT CHEST FINDINGS Cardiovascular: Port in the anterior chest wall with tip in distal SVC. No significant vascular findings. Normal heart size. No pericardial effusion. Mediastinum/Nodes: No axillary or supraclavicular adenopathy. No mediastinal or hilar adenopathy. No pericardial fluid. Esophagus normal. Lungs/Pleura: No suspicious pulmonary nodules. Normal pleural. Airways normal. Musculoskeletal: Expansile rib lesions hypermetabolic on comparison PET-CT scan. Lesion the anterior LEFT third rib in the anterior RIGHT fourth rib. Findings similar to PET-CT 07/11/2022 CT ABDOMEN AND PELVIS  FINDINGS Hepatobiliary: No focal hepatic lesion. Pancreas: Pancreas is normal. No ductal dilatation. No pancreatic inflammation. Spleen: Normal spleen Adrenals/urinary tract: Adrenal glands normal. Simple fluid attenuation cyst in the RIGHT kidney. Right Bosniak I benign renal cyst. No follow-up imaging is recommended. JACR 2018 Feb; 264-273, Management of the Incidental Renal Mass on CT, [employer] 2021; [phone], Bosniak Classification of Cystic Renal Masses, Version 2019. Ureters and bladder normal Stomach/Bowel: Stomach, small bowel, appendix, and cecum are normal. Moderate volume stool throughout the entire colon. Vascular/Lymphatic: Abdominal aorta is normal caliber with atherosclerotic calcification. There is no retroperitoneal or periportal lymphadenopathy. No pelvic lymphadenopathy. Reproductive: Unremarkable Other: Haziness in the presacral space may relate to prior radiation treatment. Musculoskeletal: Multiple expansile metastatic lesions in the pelvis. These lesions are bulky benign signal change from prior. For example lesion involving the RIGHT inferior ischium measuring 7.5 by 4.1 cm (image 118/3. These lesions are hypermetabolic on comparison PET-CT scan. Extensive involvement of the LEFT pelvis and sacrum. No pathologic fracture. LEFT hip prosthetic. IMPRESSION: 1. No pulmonary metastasis.  No lymphadenopathy 2. Multiple expansile rib lesions again noted. 3. No evidence of bowel obstruction. Moderate volume stool throughout the colon suggest constipation. 4.  Expansile metastatic lesions in the LEFT pelvis. These lesions are extremely bulky but not changed from prior. 5. No acute osseous findings. Electronically Signed   By: Genevive Bi M.D.   On: 10/14/2022 19:30   CT L-SPINE NO CHARGE  Result Date: 10/14/2022 CLINICAL DATA:  saw on the 30th at [facility] for the same. Pt here with complaints of bilateral leg pain so severe that he is unable to sleep. Pt tearful in triage and request that  he not get a IM shot of Dilaudid again. Colon cancer staging. EXAM: CT LUMBAR SPINE WITHOUT CONTRAST TECHNIQUE: Multidetector CT imaging of the lumbar spine was performed without intravenous contrast administration. Multiplanar CT image reconstructions were also generated. RADIATION DOSE REDUCTION: This exam was performed according to the departmental dose-optimization program which includes automated exposure control, adjustment of the mA and/or kV according to patient size and/or use of iterative reconstruction technique. COMPARISON:  X-ray lumbar spine 07/23/2022 FINDINGS: Segmentation: 5 lumbar type vertebrae with a sacralized L5. Alignment: Normal. Vertebrae: Multilevel mild to moderate degenerative changes of the spine. Posterior disc osteophyte complex formation at the L4-L5 level. No associated severe osseous neural foraminal or central canal stenosis. No acute fracture. Poorly defined wide zone of transition sclerotic lesions of the sacrum, L5 vertebral body, iliac bones suggestive of osseous metastases in the setting of colon cancer. Question similar finding of the spinous process of the L2 vertebral body. Paraspinal and other soft tissues: Negative. Disc levels: Maintained. IMPRESSION: 1. No acute displaced fracture or traumatic listhesis of the lumbar spine. 2. Lumbosacral and iliac findings suggestive of a sclerotic osseous metastases in the setting of colon cancer. 3. Please see separately dictated CT chest, abdomen, pelvis 10/14/2022. Electronically Signed   By: Tish Frederickson M.D.   On: 10/14/2022 19:17   US SCROTUM W/DOPPLER  Result Date: 10/11/2022 CLINICAL DATA:  Four day history of scrotal swelling EXAM: SCROTAL ULTRASOUND DOPPLER ULTRASOUND OF THE TESTICLES TECHNIQUE: Complete ultrasound examination of the testicles, epididymis, and other scrotal structures was performed. Color and spectral Doppler ultrasound were also utilized to evaluate blood flow to the testicles. COMPARISON:  None  Available. FINDINGS: Right testicle Measurements: 4.0 x 2.8 x 2.4 cm, 14.5 mL. No mass or microlithiasis visualized. Left testicle Measurements: 3.6 x 2.5 x 2.4 cm, 11.1 mL. No mass or microlithiasis visualized. Right epididymis:  Small epididymal head cyst measures 4 x 3 x 3 mm. Left epididymis:  Normal in size and appearance. Hydrocele:  Small bilateral hydroceles. Varicocele:  None visualized. Pulsed Doppler interrogation of both testes demonstrates normal low resistance arterial and venous waveforms bilaterally. Diffuse soft tissue edema of the overlying scrotum. IMPRESSION: 1. Diffuse soft tissue edema of the overlying scrotum. 2. Small bilateral hydroceles. 3. No evidence of testicular torsion. Electronically Signed   By: Agustin Cree M.D.   On: 10/11/2022 16:50   US Venous Img Lower Right (DVT Study)  Result Date: 10/11/2022 CLINICAL DATA:  Leg swelling and pain since Monday. History of rectal cancer. EXAM: Right LOWER EXTREMITY VENOUS DOPPLER ULTRASOUND TECHNIQUE: Gray-scale sonography with compression, as well as color and duplex ultrasound, were performed to evaluate the deep venous system(s) from the level of the common femoral vein through the popliteal and proximal calf veins. COMPARISON:  Ultrasound 06/04/2022 FINDINGS: VENOUS Normal compressibility of the common femoral, superficial femoral, and popliteal veins, as well as the visualized calf veins. Visualized portions of profunda femoral vein and great saphenous vein unremarkable. No filling defects to suggest DVT on grayscale or  color Doppler imaging. Doppler waveforms show normal direction of venous flow, normal respiratory plasticity and response to augmentation. Limited views of the contralateral common femoral vein are unremarkable. OTHER Few prominent inguinal nodes identified. These are enlarged up to 2.3 x 2.6 cm but this has a fatty hilum and normal morphology. These could be reactive. Please correlate clinical findings. There also soft  tissue edema in the calf. Limitations: none IMPRESSION: No evidence of right lower extremity DVT. Calf edema with enlarged but normally morphologic inguinal lymph nodes. Please correlate with clinical findings Electronically Signed   By: Karen Kays M.D.   On: 10/11/2022 16:47    Microbiology: Results for orders placed or performed during the hospital encounter of 02/03/22  MRSA Next Gen by PCR, Nasal     Status: None   Collection Time: 02/05/22 10:42 PM   Specimen: Nasal Mucosa; Nasal Swab  Result Value Ref Range Status   MRSA by PCR Next Gen NOT DETECTED NOT DETECTED Final    Comment: (NOTE) The GeneXpert MRSA Assay (FDA approved for NASAL specimens only), is one component of a comprehensive MRSA colonization surveillance program. It is not intended to diagnose MRSA infection nor to guide or monitor treatment for MRSA infections. Test performance is not FDA approved in patients less than 64 years old. Performed at [facility], [address] [place], [place] [address]     Labs: CBC: No results for input(s): "WBC", "NEUTROABS", "HGB", "HCT", "MCV", "PLT" in the last 168 hours. Basic Metabolic Panel: No results for input(s): "NA", "K", "CL", "CO2", "GLUCOSE", "BUN", "CREATININE", "CALCIUM", "MG", "PHOS" in the last 168 hours. Liver Function Tests: No results for input(s): "AST", "ALT", "ALKPHOS", "BILITOT", "PROT", "ALBUMIN" in the last 168 hours. CBG: Recent Labs  Lab 10/20/22 0729 10/20/22 1158 10/20/22 1616 10/20/22 2023 10/21/22 0734  GLUCAP 130* 132* 129* 141* 139*    Discharge time spent: 37 minutes.   Signed: Kathlen Mody, MD [facility]

## 2022-10-28 ENCOUNTER — Telehealth: Payer: Self-pay | Admitting: *Deleted

## 2022-10-28 ENCOUNTER — Other Ambulatory Visit: Payer: Self-pay | Admitting: Hematology and Oncology

## 2022-10-28 ENCOUNTER — Telehealth: Payer: Self-pay | Admitting: Hematology and Oncology

## 2022-10-28 DIAGNOSIS — C20 Malignant neoplasm of rectum: Secondary | ICD-10-CM

## 2022-10-28 NOTE — Telephone Encounter (Signed)
TCT patient as his request through our scheduler, Erie Noe. Pt is having some challenges with pain control. He is asking if he can take his oxycodone every 2 hours. Advised that he should not do that. Advised he can take 2 capsules every 4-6 hours. Also suggested he can take Tylenol in between if he needs. Advised that we will be setting up an appt for him to see Nolon Bussing, NP in Palliative [facility] to help adjust his pain meds. Likely this appt will be next week. Pt voiced understanding.

## 2022-10-29 ENCOUNTER — Other Ambulatory Visit: Payer: Self-pay

## 2022-10-29 ENCOUNTER — Telehealth: Payer: Self-pay | Admitting: Nurse Practitioner

## 2022-10-29 NOTE — Telephone Encounter (Signed)
Scheduled appointment per referral. Patient is aware of the appointment time and date. All questions were answered.

## 2022-11-01 ENCOUNTER — Encounter: Payer: Self-pay | Admitting: Hematology and Oncology

## 2022-11-01 ENCOUNTER — Telehealth: Payer: Self-pay | Admitting: *Deleted

## 2022-11-01 ENCOUNTER — Other Ambulatory Visit: Payer: Self-pay | Admitting: *Deleted

## 2022-11-01 DIAGNOSIS — M1990 Unspecified osteoarthritis, unspecified site: Secondary | ICD-10-CM | POA: Diagnosis not present

## 2022-11-01 DIAGNOSIS — I10 Essential (primary) hypertension: Secondary | ICD-10-CM | POA: Diagnosis not present

## 2022-11-01 DIAGNOSIS — G893 Neoplasm related pain (acute) (chronic): Secondary | ICD-10-CM | POA: Diagnosis not present

## 2022-11-01 DIAGNOSIS — E119 Type 2 diabetes mellitus without complications: Secondary | ICD-10-CM | POA: Diagnosis not present

## 2022-11-01 DIAGNOSIS — C20 Malignant neoplasm of rectum: Secondary | ICD-10-CM

## 2022-11-01 DIAGNOSIS — E785 Hyperlipidemia, unspecified: Secondary | ICD-10-CM | POA: Diagnosis not present

## 2022-11-01 DIAGNOSIS — D630 Anemia in neoplastic disease: Secondary | ICD-10-CM | POA: Diagnosis not present

## 2022-11-01 DIAGNOSIS — D500 Iron deficiency anemia secondary to blood loss (chronic): Secondary | ICD-10-CM | POA: Diagnosis not present

## 2022-11-01 DIAGNOSIS — C7951 Secondary malignant neoplasm of bone: Secondary | ICD-10-CM

## 2022-11-01 NOTE — Telephone Encounter (Signed)
Received message via MyChart from pt's son, requesting a call regarding home health services. TCT son and spoke with him. He states that his father is requiring more care. Both he and his sister work full time and are struggling to meet the care demands his father has.  Advised that I would contact [facility] for [facility] aide for personal care and skilled nursing for eval and pain control checks. Son asked if the aide can stay for a couple of hours. Advised that they don't do that. Advised that if his care needs are several hours a day then they may need to think about private hire for his care needs. Son states that his father does have the financial ability to pay for this . He states he will look into this. Reminded son of pt's appts this coming week that include, Georga Kaufmann, [place] and Palliative Care with Nolon Bussing, NP for p[ain control concerns. Order for [facility] [place] [facility] aide has been fax'd to [place] after speaking with them on the phone.  SW referral made for support to the family

## 2022-11-04 ENCOUNTER — Encounter: Payer: Self-pay | Admitting: Hematology and Oncology

## 2022-11-04 NOTE — Progress Notes (Signed)
Palliative Medicine [place] [facility]  Telephone:(336) [phone] Fax:(336) [phone]   Name: Joseph Hogan Date: 11/04/2022 MRN: [id number]  DOB: December 17, 1960  Patient Care Team: [name], MD as PCP - General (Hematology and Oncology) Doreatha Massed, MD as Medical Oncologist (Medical Oncology)   REASON FOR CONSULTATION: Joseph Hogan is a 62 y.o. 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  Palliative ask to see for symptom management and goals of care.   SOCIAL HISTORY:     reports that he has never smoked. He has never used smokeless tobacco. He reports that he does not drink alcohol and does not use drugs.  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   with abnormal R wave progression noted on EKG   MRSA (methicillin resistant Staphylococcus aureus)    Pre-diabetes    Rectal carcinoma (HCC) 03/19/2022    PAST SURGICAL HISTORY:  Past Surgical History:  Procedure Laterality Date   COLONOSCOPY     HUMERUS IM NAIL Right 04/12/2022   Procedure: INTRAMEDULLARY (IM) NAIL HUMERAL;  Surgeon: Bjorn Pippin, MD;  Location: [facility];  Service: Orthopedics;  Laterality: Right;   IR IMAGING GUIDED PORT INSERTION  04/05/2022   TOTAL HIP ARTHROPLASTY Left 02/06/2022   Procedure: TOTAL HIP ARTHROPLASTY;  Surgeon: Joen Laura, MD;  Location: [facility];  Service: Orthopedics;  Laterality: Left;    HEMATOLOGY/ONCOLOGY HISTORY:  Oncology History  Rectal carcinoma (HCC)  03/19/2022 Initial Diagnosis   Rectal carcinoma (HCC)   03/19/2022 Cancer Staging   Staging form: Colon and Rectum, AJCC 8th Edition - Clinical stage from 03/19/2022: Stage IVB (cTX, cN2b,  cM1b) - Signed by [name], MD on 05/21/2022 Stage prefix: Initial diagnosis   04/08/2022 - 07/04/2022 Chemotherapy   Patient is on Treatment Plan : COLORECTAL FOLFOX + Bevacizumab q14d     07/31/2022 -  Chemotherapy   Patient is on Treatment Plan : COLORECTAL FOLFIRI + Panitumumab q14d       ALLERGIES:  is allergic to codeine and lisinopril.  MEDICATIONS:  Current Outpatient Medications  Medication Sig Dispense Refill   cyanocobalamin (VITAMIN B12) 1000 MCG tablet Take 1 tablet (1,000 mcg total) by mouth daily. 30 tablet 1   gabapentin (NEURONTIN) 300 MG capsule Take 1 capsule (300 mg total) by mouth at bedtime. (Patient not taking: Reported on 07/31/2022) 30 capsule 1   lidocaine-prilocaine (EMLA) cream Apply a quarter-sized amount to port a cath site and cover with plastic wrap one hour prior to infusion appointments (Patient not taking: Reported on 09/11/2022) 30 g 3   ondansetron (ZOFRAN) 8 MG tablet Take 2 tablets (16 mg total) by mouth every 8 (eight) hours as needed for nausea or vomiting. (Patient not taking: Reported on 08/14/2022) 20 tablet 3   oxyCODONE (OXY IR/ROXICODONE) 5 MG immediate release tablet Take 1-2 tablets (5-10 mg total) by mouth every 6 (six) hours as needed for severe pain. 90 tablet 0   pantoprazole (PROTONIX) 20 MG tablet Take 1 tablet (20 mg total) by mouth daily. 30 tablet 0   polyethylene glycol (MIRALAX / GLYCOLAX) 17 g packet Take 17 g by mouth daily as needed for mild  constipation. 14 each 0   senna-docusate (SENOKOT-S) 8.6-50 MG tablet Take 1 tablet by mouth 2 (two) times daily. 30 tablet 0   XTAMPZA ER 13.5 MG C12A TAKE 1 CAPSULE BY MOUTH TWICE DAILY 60 capsule 0   No current facility-administered medications for this visit.    VITAL SIGNS: There were no vitals taken for this visit. There were no vitals filed for this visit.  Estimated body mass index is 29.57 kg/m as calculated from the following:   Height as of 10/14/22: 5\' 11"  (1.803 m).    Weight as of 10/14/22: 212 lb (96.2 kg).  LABS: CBC:    Component Value Date/Time   WBC 4.3 10/16/2022 0500   HGB 9.0 (L) 10/16/2022 0500   HGB 11.2 (L) 10/02/2022 0947   HCT 28.0 (L) 10/16/2022 0500   PLT 172 10/16/2022 0500   PLT 232 10/02/2022 0947   MCV 91.8 10/16/2022 0500   NEUTROABS 3.2 10/16/2022 0500   LYMPHSABS 0.4 (L) 10/16/2022 0500   MONOABS 0.7 10/16/2022 0500   EOSABS 0.1 10/16/2022 0500   BASOSABS 0.0 10/16/2022 0500   Comprehensive Metabolic Panel:    Component Value Date/Time   NA 133 (L) 10/16/2022 0500   K 3.7 10/16/2022 0500   CL 102 10/16/2022 0500   CO2 22 10/16/2022 0500   BUN 19 10/16/2022 0500   CREATININE 0.62 10/16/2022 0500   CREATININE 0.46 (L) 10/02/2022 0947   CREATININE 0.77 06/04/2016 1259   GLUCOSE 120 (H) 10/16/2022 0500   CALCIUM 8.1 (L) 10/16/2022 0500   AST 37 10/15/2022 0755   AST 27 10/02/2022 0947   ALT 13 10/15/2022 0755   ALT 15 10/02/2022 0947   ALKPHOS 176 (H) 10/15/2022 0755   BILITOT 0.9 10/15/2022 0755   BILITOT 0.6 10/02/2022 0947   PROT 6.5 10/15/2022 0755   ALBUMIN 2.8 (L) 10/16/2022 0500    RADIOGRAPHIC STUDIES: CT CHEST ABDOMEN PELVIS W CONTRAST  Result Date: 10/14/2022 CLINICAL DATA:  Colorectal carcinoma staging. Bilateral leg pain which is severe. 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 * Tracking Code: BO * EXAM: CT CHEST, ABDOMEN, AND PELVIS WITH CONTRAST TECHNIQUE: Multidetector CT imaging of the chest, abdomen and pelvis was performed following the standard protocol during bolus administration of intravenous contrast. RADIATION DOSE REDUCTION: This exam was performed according to the departmental dose-optimization program which includes automated exposure control, adjustment of the mA and/or kV according to patient size and/or use of iterative reconstruction technique. CONTRAST:  OMNIPAQUE IOHEXOL 300 MG/ML  SOLN COMPARISON:  CT pelvis 07/23/2022, PET-CT scan 07/11/2022  FINDINGS: CT CHEST FINDINGS CT CHEST FINDINGS Cardiovascular: Port in the anterior chest wall with tip in distal SVC. No significant vascular findings. Normal heart size. No pericardial effusion. Mediastinum/Nodes: No axillary or supraclavicular adenopathy. No mediastinal or hilar adenopathy. No pericardial fluid. Esophagus normal. Lungs/Pleura: No suspicious pulmonary nodules. Normal pleural. Airways normal. Musculoskeletal: Expansile rib lesions hypermetabolic on comparison PET-CT scan. Lesion the anterior LEFT third rib in the anterior RIGHT fourth rib. Findings similar to PET-CT 07/11/2022 CT ABDOMEN AND PELVIS FINDINGS Hepatobiliary: No focal hepatic lesion. Pancreas: Pancreas is normal. No ductal dilatation. No pancreatic inflammation. Spleen: Normal spleen Adrenals/urinary tract: Adrenal glands normal. Simple fluid attenuation cyst in the RIGHT kidney. Right Bosniak I benign renal cyst. No follow-up imaging is recommended. JACR 2018 Feb; 264-273, Management of the Incidental Renal Mass on CT, [employer] 2021; [id number], Bosniak Classification of Cystic Renal Masses, Version 2019. Ureters and bladder normal Stomach/Bowel: Stomach, small bowel,  appendix, and cecum are normal. Moderate volume stool throughout the entire colon. Vascular/Lymphatic: Abdominal aorta is normal caliber with atherosclerotic calcification. There is no retroperitoneal or periportal lymphadenopathy. No pelvic lymphadenopathy. Reproductive: Unremarkable Other: Haziness in the presacral space may relate to prior radiation treatment. Musculoskeletal: Multiple expansile metastatic lesions in the pelvis. These lesions are bulky benign signal change from prior. For example lesion involving the RIGHT inferior ischium measuring 7.5 by 4.1 cm (image 118/3. These lesions are hypermetabolic on comparison PET-CT scan. Extensive involvement of the LEFT pelvis and sacrum. No pathologic fracture. LEFT hip prosthetic. IMPRESSION: 1. No pulmonary  metastasis.  No lymphadenopathy 2. Multiple expansile rib lesions again noted. 3. No evidence of bowel obstruction. Moderate volume stool throughout the colon suggest constipation. 4. Expansile metastatic lesions in the LEFT pelvis. These lesions are extremely bulky but not changed from prior. 5. No acute osseous findings. Electronically Signed   By: Genevive Bi M.D.   On: 10/14/2022 19:30   CT L-SPINE NO CHARGE  Result Date: 10/14/2022 CLINICAL DATA:  saw on the 30th at [facility] for the same. Pt here with complaints of bilateral leg pain so severe that he is unable to sleep. Pt tearful in triage and request that he not get a IM shot of Dilaudid again. Colon cancer staging. EXAM: CT LUMBAR SPINE WITHOUT CONTRAST TECHNIQUE: Multidetector CT imaging of the lumbar spine was performed without intravenous contrast administration. Multiplanar CT image reconstructions were also generated. RADIATION DOSE REDUCTION: This exam was performed according to the departmental dose-optimization program which includes automated exposure control, adjustment of the mA and/or kV according to patient size and/or use of iterative reconstruction technique. COMPARISON:  X-ray lumbar spine 07/23/2022 FINDINGS: Segmentation: 5 lumbar type vertebrae with a sacralized L5. Alignment: Normal. Vertebrae: Multilevel mild to moderate degenerative changes of the spine. Posterior disc osteophyte complex formation at the L4-L5 level. No associated severe osseous neural foraminal or central canal stenosis. No acute fracture. Poorly defined wide zone of transition sclerotic lesions of the sacrum, L5 vertebral body, iliac bones suggestive of osseous metastases in the setting of colon cancer. Question similar finding of the spinous process of the L2 vertebral body. Paraspinal and other soft tissues: Negative. Disc levels: Maintained. IMPRESSION: 1. No acute displaced fracture or traumatic listhesis of the lumbar spine. 2. Lumbosacral and iliac  findings suggestive of a sclerotic osseous metastases in the setting of colon cancer. 3. Please see separately dictated CT chest, abdomen, pelvis 10/14/2022. Electronically Signed   By: Tish Frederickson M.D.   On: 10/14/2022 19:17   PERFORMANCE STATUS (ECOG) : 2 - Symptomatic, <50% confined to bed  Review of Systems  Constitutional:  Positive for activity change and fatigue.  Musculoskeletal:  Positive for arthralgias and back pain.  Neurological:  Positive for weakness.  Unless otherwise noted, a complete review of systems is negative.  Physical Exam General: Appears uncomfortable in recliner Cardiovascular: regular rate and rhythm Pulmonary: clear ant fields Abdomen: soft, nontender, + bowel sounds Extremities: no edema, no joint deformities Skin: no rashes Neurological: Alert and oriented x3  IMPRESSION: This is my initial visit with Mr. Zuercher. No family is present however I called and included his son, Shoua in discussions via speakerphone. [name] is in recliner. Appears somewhat uncomfortable. Is complaining of discomfort in his lower extremities and upper back area.   I introduced myself, [place] RN, and Palliative's role in collaboration with the oncology team. Concept of Palliative Care was introduced as specialized medical care for people  and their families living with serious illness.  It focuses on providing relief from the symptoms and stress of a serious illness.  The goal is to improve quality of life for both the patient and the family. Values and goals of care important to patient and family were attempted to be elicited.   Mr. Gallen lives in the home alone. His children are supportive and checks on him regularly. His daughter prepares his meals. He has 2 daughters and a son. 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   At home he uses a walker. Spends most of his time in recliner or bed due to significant weakness and pain. Unable to perform  most ADLs without assistance due to limitations. Appetite is minimal. Some days are better than others.   Neoplasm related pain Mr. Lingard complains of uncontrolled pain in his right hip which radiates down his right leg, lower back and neck radiating to his shoulders. Describes pain as constant, irritating, throbbing, sharp, tingling, stabbing, and jolting. Pain worsens with movement or activity. Interferes with ability to perform ADLs or sleep during the night. Nothing has made his pain better.   I have reviewed PDMP at length. Mr. Limbacher has been on Xtampza for several months in addition to oxycodone. Once was on hydromorphone however he states this provided minimal relief and oxycodone worked better. We reviewed his current regimen: Xtampza 27 mg twice daily and oxycodone 10mg  every 6 hours as needed for breakthrough pain. His Xtampza was recently increased on 9/24 from 13.5 to 27 mg during recent ED visit. Patient however has not been taking. He received initial dose of 27mg  in office today. Confirms he has been taking his oxycodone 10mg  every 6 hours as needed. Son states his sister does patient's medications weekly in a pill box and will notify her of the changes.   We discussed continuing with current regimen to allow for observation of pain control before making any additional changes. We will continue to closely monitor. Written instructions provided on how to take patient medication and schedule for clarity and compliance at home.   All questions answered and support provided.   Constipation Patient endorses occasional constipation. He has Senna at home. Has not been taken as he did not feel he was receiving any relief. Advised to restart with 2 tablets at bedtime and Mirlax daily. Education provided on the importance of bowel regimen in the setting of opioid use.   Decreased appetite/fatigue Daeron reports decreased appetite and ongoing fatigue. He has not been able to sleep well due to his  pain. We discussed hopes of ability to gain a better sleep pattern once pain is better managed. His daughter prepares meals for him. Discussed focusing on small frequent meals and snacks versus large meals. Also protein supplements such as boost or ensure.   Goals of Care We discussed his current illness and what it means in the larger context of his on-going co-morbidities. Natural disease trajectory and expectations were discussed.  Patient's son verbalized understanding of his father's current illness. Patient also able to acknowledge some form of understanding but with some obvious signs of confusion when it comes to memory recall. He mentions his children assist with most of his medical needs.   I empathetically approached discussions regarding healthcare limitations including code status and advanced directives. Son, Tommas states they are in the process of completing documents. [name] (patient) confirms. They verbalize patient's son Senon Joynes is primary medical decision  maker. They request patient to remain full code with full scope care. Mr. Holstad and son are clear in expressed wishes to continue to treat the treatable allowing patient every opportunity to continue to do as good as he can for as long as he can while managing his symptoms.   I discussed the importance of continued conversation with family and their medical providers regarding overall plan of care and treatment options, ensuring decisions are within the context of the patients values and GOCs.  PLAN: Established therapeutic relationship. Education provided on palliative's role in collaboration with their Oncology/Radiation team. Xtampza 27mg  twice daily. Received initial dose during clinic today. Oxycodone 10mg  every 6 hours as needed for breakthrough pain Hydromorphone 1mg  IV x2 while in infusion Senna-S 2 tablets at bedtime Miralax daily Ongoing symptom management needs and goals of care discussions. Written  medication schedule and pill organizer provided to patient for home support.  Patient and family in process of completing advanced directives. I will plan to see patient back in 1-2 weeks in collaboration to other oncology appointments. Will continue to closely support.    Patient expressed understanding and was in agreement with this plan. He also understands that He can call the clinic at any time with any questions, concerns, or complaints.   Thank you for your referral and allowing Palliative to assist in Mr. Lubertha Basque Farkas's care.   Number and complexity of problems addressed: HIGH - 1 or more chronic illnesses with SEVERE exacerbation, progression, or side effects of treatment - advanced cancer, pain. Any controlled substances utilized were prescribed in the context of palliative care.   Visit consisted of counseling and education dealing with the complex and emotionally intense issues of symptom management and palliative care in the setting of serious and potentially life-threatening illness.Greater than 50%  of this time was spent counseling and coordinating care related to the above assessment and plan.  Signed by: Willette Alma, AGPCNP-BC Palliative Medicine Team/[facility]   *Please note that this is a verbal dictation therefore any spelling or grammatical errors are due to the "[employer] One" system interpretation.

## 2022-11-05 ENCOUNTER — Telehealth: Payer: Self-pay | Admitting: *Deleted

## 2022-11-05 ENCOUNTER — Encounter (HOSPITAL_COMMUNITY): Payer: Self-pay | Admitting: *Deleted

## 2022-11-05 ENCOUNTER — Inpatient Hospital Stay: Payer: Medicare HMO | Attending: Radiation Oncology | Admitting: Licensed Clinical Social Worker

## 2022-11-05 ENCOUNTER — Other Ambulatory Visit: Payer: Self-pay

## 2022-11-05 ENCOUNTER — Emergency Department (HOSPITAL_COMMUNITY)
Admission: EM | Admit: 2022-11-05 | Discharge: 2022-11-05 | Disposition: A | Discharge status: Home / Self Care | Payer: Medicare HMO | Attending: Emergency Medicine | Admitting: Emergency Medicine

## 2022-11-05 DIAGNOSIS — C7951 Secondary malignant neoplasm of bone: Secondary | ICD-10-CM | POA: Insufficient documentation

## 2022-11-05 DIAGNOSIS — Z79899 Other long term (current) drug therapy: Secondary | ICD-10-CM | POA: Insufficient documentation

## 2022-11-05 DIAGNOSIS — C20 Malignant neoplasm of rectum: Secondary | ICD-10-CM | POA: Insufficient documentation

## 2022-11-05 DIAGNOSIS — E119 Type 2 diabetes mellitus without complications: Secondary | ICD-10-CM | POA: Insufficient documentation

## 2022-11-05 DIAGNOSIS — M79661 Pain in right lower leg: Secondary | ICD-10-CM | POA: Diagnosis present

## 2022-11-05 DIAGNOSIS — Z7189 Other specified counseling: Secondary | ICD-10-CM | POA: Diagnosis not present

## 2022-11-05 DIAGNOSIS — G8929 Other chronic pain: Secondary | ICD-10-CM | POA: Diagnosis not present

## 2022-11-05 DIAGNOSIS — R52 Pain, unspecified: Secondary | ICD-10-CM

## 2022-11-05 DIAGNOSIS — K5900 Constipation, unspecified: Secondary | ICD-10-CM | POA: Insufficient documentation

## 2022-11-05 DIAGNOSIS — Z5111 Encounter for antineoplastic chemotherapy: Secondary | ICD-10-CM | POA: Insufficient documentation

## 2022-11-05 DIAGNOSIS — E785 Hyperlipidemia, unspecified: Secondary | ICD-10-CM | POA: Insufficient documentation

## 2022-11-05 DIAGNOSIS — R000 Tachycardia, unspecified: Secondary | ICD-10-CM | POA: Diagnosis not present

## 2022-11-05 DIAGNOSIS — M25551 Pain in right hip: Secondary | ICD-10-CM | POA: Insufficient documentation

## 2022-11-05 DIAGNOSIS — G893 Neoplasm related pain (acute) (chronic): Secondary | ICD-10-CM

## 2022-11-05 DIAGNOSIS — I10 Essential (primary) hypertension: Secondary | ICD-10-CM | POA: Insufficient documentation

## 2022-11-05 DIAGNOSIS — Z515 Encounter for palliative care: Secondary | ICD-10-CM

## 2022-11-05 DIAGNOSIS — Z5112 Encounter for antineoplastic immunotherapy: Secondary | ICD-10-CM | POA: Insufficient documentation

## 2022-11-05 LAB — CBC WITH DIFFERENTIAL/PLATELET
Abs Immature Granulocytes: 0.17 10*3/uL — ABNORMAL HIGH (ref 0.00–0.07)
Basophils Absolute: 0 10*3/uL (ref 0.0–0.1)
Basophils Relative: 0 %
Eosinophils Absolute: 0 10*3/uL (ref 0.0–0.5)
Eosinophils Relative: 1 %
HCT: 27.5 % — ABNORMAL LOW (ref 39.0–52.0)
Hemoglobin: 8.8 g/dL — ABNORMAL LOW (ref 13.0–17.0)
Immature Granulocytes: 2 %
Lymphocytes Relative: 4 %
Lymphs Abs: 0.3 10*3/uL — ABNORMAL LOW (ref 0.7–4.0)
MCH: 28.5 pg (ref 26.0–34.0)
MCHC: 32 g/dL (ref 30.0–36.0)
MCV: 89 fL (ref 80.0–100.0)
Monocytes Absolute: 0.7 10*3/uL (ref 0.1–1.0)
Monocytes Relative: 8 %
Neutro Abs: 6.9 10*3/uL (ref 1.7–7.7)
Neutrophils Relative %: 85 %
Platelets: 449 10*3/uL — ABNORMAL HIGH (ref 150–400)
RBC: 3.09 MIL/uL — ABNORMAL LOW (ref 4.22–5.81)
RDW: 15.8 % — ABNORMAL HIGH (ref 11.5–15.5)
WBC: 8.2 10*3/uL (ref 4.0–10.5)
nRBC: 0 % (ref 0.0–0.2)

## 2022-11-05 LAB — BASIC METABOLIC PANEL
Anion gap: 12 (ref 5–15)
BUN: 11 mg/dL (ref 8–23)
CO2: 26 mmol/L (ref 22–32)
Calcium: 8.3 mg/dL — ABNORMAL LOW (ref 8.9–10.3)
Chloride: 96 mmol/L — ABNORMAL LOW (ref 98–111)
Creatinine, Ser: 0.56 mg/dL — ABNORMAL LOW (ref 0.61–1.24)
GFR, Estimated: >60 mL/min (ref >60)
Glucose, Bld: 156 mg/dL — ABNORMAL HIGH (ref 70–99)
Potassium: 3.7 mmol/L (ref 3.5–5.1)
Sodium: 134 mmol/L — ABNORMAL LOW (ref 135–145)

## 2022-11-05 MED ORDER — HYDROMORPHONE HCL 1 MG/ML IJ SOLN
1.0000 mg | Freq: Once | INTRAMUSCULAR | Status: AC
  Administered 2022-11-05: 1 mg via INTRAVENOUS
  Filled 2022-11-05: qty 1

## 2022-11-05 MED ORDER — XTAMPZA ER 13.5 MG PO C12A: 2.0000 | EXTENDED_RELEASE_CAPSULE | Freq: Two times a day (BID) | ORAL | 0 refills | Status: AC

## 2022-11-05 MED ORDER — SODIUM CHLORIDE 0.9 % IV BOLUS
500.0000 mL | Freq: Once | INTRAVENOUS | Status: AC
  Administered 2022-11-05: 500 mL via INTRAVENOUS

## 2022-11-05 MED ORDER — ONDANSETRON HCL 4 MG/2ML IJ SOLN
4.0000 mg | Freq: Once | INTRAMUSCULAR | Status: AC
  Administered 2022-11-05: 4 mg via INTRAVENOUS
  Filled 2022-11-05: qty 2

## 2022-11-05 MED ORDER — HEPARIN SOD (PORK) LOCK FLUSH 100 UNIT/ML IV SOLN
INTRAVENOUS | Status: AC
  Administered 2022-11-05: 100 [IU]
  Filled 2022-11-05: qty 5

## 2022-11-05 MED ORDER — OXYCODONE HCL 5 MG PO TABS
5.0000 mg | ORAL_TABLET | Freq: Four times a day (QID) | ORAL | 0 refills | Status: AC | PRN
Start: 2022-11-05 — End: 2022-11-10

## 2022-11-05 NOTE — ED Provider Notes (Signed)
[place] EMERGENCY DEPARTMENT AT [facility] Provider Note   CSN: [id number] Arrival date & time: 11/05/22  1220     History  Chief Complaint  Patient presents with   Leg Pain    Cancer pt    Joseph Hogan is a 62 y.o. male.  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  He was just discharged from [facility] 2 weeks ago for intractable pain.  He has been maintained on narcotic pain medicine and doing well till last night when he experienced breakthrough pain that was not controlled with his medications.  It is mostly in his right hip and he says it wraps around his right leg to his right calf.  This is not a new pain for the patient, this has been where he has been experiencing it all along.  He is moving his bowels although slowly.  No fevers.  He says he is able to manage at home okay if his pain is controlled, walks with a walker.  Has an appointment with oncology on Thursday  The history is provided by the patient.  Leg Pain Location:  Hip and leg Injury: no   Hip location:  R hip Leg location:  R leg Pain details:    Quality:  Aching and throbbing   Severity:  Severe   Onset quality:  Gradual   Duration:  18 hours   Timing:  Constant   Progression:  Unchanged Chronicity:  Recurrent Relieved by:  Nothing Worsened by:  Nothing Ineffective treatments: narcotics. Associated symptoms: no fever and no numbness   Risk factors: known bone disorder        Home Medications Prior to Admission medications   Medication Sig Start Date End Date Taking? Authorizing Provider  cyanocobalamin (VITAMIN B12) 1000 MCG tablet Take 1 tablet (1,000 mcg total) by mouth daily. 10/20/22   Kathlen Mody, MD  gabapentin (NEURONTIN) 300 MG capsule Take 1 capsule (300 mg total) by mouth at bedtime. Patient not taking: Reported on 07/31/2022 06/06/22   [name], MD  lidocaine-prilocaine (EMLA) cream Apply a quarter-sized amount to port a cath site and cover with plastic  wrap one hour prior to infusion appointments Patient not taking: Reported on 09/11/2022 08/27/22   [name], MD  ondansetron (ZOFRAN) 8 MG tablet Take 2 tablets (16 mg total) by mouth every 8 (eight) hours as needed for nausea or vomiting. Patient not taking: Reported on 08/14/2022 08/02/22   [name], MD  oxyCODONE (OXY IR/ROXICODONE) 5 MG immediate release tablet Take 1-2 tablets (5-10 mg total) by mouth every 6 (six) hours as needed for severe pain. 10/25/22   [name], MD  pantoprazole (PROTONIX) 20 MG tablet Take 1 tablet (20 mg total) by mouth daily. 10/20/22   Kathlen Mody, MD  polyethylene glycol (MIRALAX / GLYCOLAX) 17 g packet Take 17 g by mouth daily as needed for mild constipation. 10/20/22   Kathlen Mody, MD  senna-docusate (SENOKOT-S) 8.6-50 MG tablet Take 1 tablet by mouth 2 (two) times daily. 10/20/22   Kathlen Mody, MD  XTAMPZA ER 13.5 MG C12A TAKE 1 CAPSULE BY MOUTH TWICE DAILY 10/23/22   [name], MD      Allergies    Codeine, Dilaudid [hydromorphone hcl], and Lisinopril    Review of Systems   Review of Systems  Constitutional:  Negative for fever.  Respiratory:  Negative for shortness of breath.  Cardiovascular:  Negative for chest pain.  Gastrointestinal:  Negative for abdominal pain.    Physical Exam Updated Vital Signs Pulse (!) 105   Temp (!) 97 F (36.1 C)   Resp 16   Ht 5\' 11"  (1.803 m)   Wt 96.1 kg   SpO2 97%   BMI 29.55 kg/m  Physical Exam Vitals and nursing note reviewed.  Constitutional:      General: He is not in acute distress.    Appearance: Normal appearance. He is well-developed.  HENT:     Head: Normocephalic and atraumatic.  Eyes:     Conjunctiva/sclera: Conjunctivae normal.  Cardiovascular:     Rate and Rhythm: Regular rhythm. Tachycardia present.     Heart sounds: No murmur heard. Pulmonary:     Effort: Pulmonary effort is normal. No respiratory distress.     Breath sounds: Normal breath sounds.   Abdominal:     Palpations: Abdomen is soft.     Tenderness: There is no abdominal tenderness.  Musculoskeletal:        General: Tenderness present.     Cervical back: Neck supple.     Comments: He is diffusely tender about his right hip.  Distal pulses motor and sensation intact.  Skin:    General: Skin is warm and dry.     Capillary Refill: Capillary refill takes less than 2 seconds.  Neurological:     General: No focal deficit present.     Mental Status: He is alert.     Sensory: No sensory deficit.     Motor: No weakness.     ED Results / Procedures / Treatments   Labs (all labs ordered are listed, but only abnormal results are displayed) Labs Reviewed  BASIC METABOLIC PANEL - Abnormal; Notable for the following components:      Result Value   Sodium 134 (*)    Chloride 96 (*)    Glucose, Bld 156 (*)    Creatinine, Ser 0.56 (*)    Calcium 8.3 (*)    All other components within normal limits  CBC WITH DIFFERENTIAL/PLATELET - Abnormal; Notable for the following components:   RBC 3.09 (*)    Hemoglobin 8.8 (*)    HCT 27.5 (*)    RDW 15.8 (*)    Platelets 449 (*)    Lymphs Abs 0.3 (*)    Abs Immature Granulocytes 0.17 (*)    All other components within normal limits    EKG None  Radiology No results found.  Procedures Procedures    Medications Ordered in ED Medications  HYDROmorphone (DILAUDID) injection 1 mg (1 mg Intravenous Given 11/05/22 1356)  ondansetron (ZOFRAN) injection 4 mg (4 mg Intravenous Given 11/05/22 1355)  sodium chloride 0.9 % bolus 500 mL (0 mLs Intravenous Stopped 11/05/22 1511)  HYDROmorphone (DILAUDID) injection 1 mg (1 mg Intravenous Given 11/05/22 1523)    ED Course/ Medical Decision Making/ A&P Clinical Course as of 11/05/22 1729  Tue Nov 05, 2022  1439 After the first dose of Dilaudid patient states his pain is still 9 out of 10.  Will try a second dose. [MB]  1503 I reached out to Dr. Leonides Schanz.  He asked if we had palliative  available and maybe they could give [name] some options. [MB]  1522 Discussed with nurse practitioner Marice Potter who will get back to [name] with their recommendations regarding pain management regiment. [MB]    Clinical Course User Index [MB] Terrilee Files, MD  Medical Decision Making Amount and/or Complexity of Data Reviewed Labs: ordered.  Risk Prescription drug management.   This patient complains of breakthrough hip pain on right side; this involves an extensive number of treatment Options and is a complaint that carries with it a high risk of complications and morbidity. The differential includes breakthrough pain, metastatic cancer, fracture, medication noncompliance I ordered medication IV pain medication and reviewed PMP when indicated. Previous records obtained and reviewed in epic including recent discharge summary from [facility] I consulted Dr. Leonides Schanz oncology and NP [facility] palliative and discussed lab and imaging findings and discussed disposition.  Cardiac monitoring reviewed, sinus rhythm Social determinants considered, no significant barriers Critical Interventions: None  After the interventions stated above, I reevaluated the patient and found patient still to be in significant pain Admission and further testing considered, have consulted palliative and awaiting their recommendations for titration of patient's pain medication.  May need admission for pain control.  His care is signed out to Dr. Durwin Nora to follow-up on results from palliative care and patient's response to treatment.         Final Clinical Impression(s) / ED Diagnoses Final diagnoses:  Primary cancer of rectum with metastasis from rectum to other site [facility])  Right hip pain    Rx / DC Orders ED Discharge Orders     None         Terrilee Files, MD 11/05/22 1733

## 2022-11-05 NOTE — Telephone Encounter (Signed)
Joseph Hogan called to let Dr Leonides Schanz know that Joseph Hogan is on the way to [facility] via ambulance. States he is in severe pain

## 2022-11-05 NOTE — Progress Notes (Signed)
Palliative: Mr. Bouton is lying quietly on the stretcher in the ED.  He appears, overall, well muscled.  He greets me, making and mostly keeping eye contact.  He is alert and oriented, able to make his needs known.  There is no family at bedside at this time.  He shares that he has great support from his children.  Mr. Vandruff shares his concern about narcotic medications, stigma related to use of narcotic medications.  I reassure him that his narcotic use is appropriate and necessary.  He shares that his bowels are working appropriately/normally.  Mr. Louanne Skye tells me that he has not taken his Xtampza or any pain medications since approximately 3 AM.   He tells me that he does have a plan to see his trusted oncologist this week.  We talked about his pain management, his metastatic cancer burden.   He tells me that he has been taking Xtampza 13.5 p.o. twice daily, but has not had pain control.    I shared that as cancer increases, pain is expected to increase to.  I shared that it would not be surprising for him to need continued increases in pain medication.  This is normal and not a sign that he is taking narcotics inappropriately.  He shares that he also takes oxycodone 5 mg 1 to 2 tablets every 6 hours as needed.   He shares that recently his bone pain has returned and he takes anywhere from 4 to 8 tablets/day (20 to 40 mg oxycodone). 20 mg of oxycodone =18 mg Xtampza  Mr. Zambrana tells me that he has had radiation therapy in the past, approximately 3 to 4 months ago.  I encouraged him to work with oncology/radiation oncology as it may be time for radiation treatments.  Mr. Vanderweide shares that he is trying to get connected with the palliative medicine team in the cancer center.  Recommendations: Increase Xtampza from 13.5 mg p.o. twice daily to Xtampza 27 mg p.o. twice daily Need for Xtampza ER 36 mg p.o. twice daily within the next few weeks would not be surprising. Increase dosing based on  frequency/use of breakthrough pain medication (20 mg of oxycodone =18 mg Xtampza)  Continue oxycodone 5 to 10 mg every 6 hours as needed for breakthrough pain  Would likely benefit from radiation treatment for bone mets, possible need for repeat treatment 10 to 12 weeks   Mr. Killingsworth took  (2) 13.5 mg Xtampza (27mg ) in ED in my presence.  I encouraged him to take his as needed oxycodone, also.  50 minutes  Lillia Carmel, NP Palliative medicine team Team phone [phone] Greater than 50% of this time was spent counseling and coordinating care related to the above assessment and plan.

## 2022-11-05 NOTE — ED Provider Notes (Signed)
Care of patient assumed from Dr. Charm Barges.  This patient has known rectal cancer with metastases to bone.  He has had breakthrough pain despite narcotic pain medication at home.  Patient was discussed with oncologist, Dr. Leonides Schanz, who recommended reaching out to palliative care.  Lillia Carmel with palliative care to review patient's information and provide pain medication recommendations. Physical Exam  Pulse (!) 105   Temp (!) 97 F (36.1 C)   Resp 16   Ht 5\' 11"  (1.803 m)   Wt 96.1 kg   SpO2 97%   BMI 29.55 kg/m   Physical Exam Vitals and nursing note reviewed.  Constitutional:      General: He is not in acute distress.    Appearance: Normal appearance. He is well-developed. He is not ill-appearing, toxic-appearing or diaphoretic.  HENT:     Head: Normocephalic and atraumatic.     Right Ear: External ear normal.     Left Ear: External ear normal.     Nose: Nose normal.     Mouth/Throat:     Mouth: Mucous membranes are moist.  Eyes:     Extraocular Movements: Extraocular movements intact.     Conjunctiva/sclera: Conjunctivae normal.  Cardiovascular:     Rate and Rhythm: Normal rate and regular rhythm.  Pulmonary:     Effort: Pulmonary effort is normal. No respiratory distress.  Abdominal:     General: There is no distension.     Palpations: Abdomen is soft.  Musculoskeletal:     Cervical back: Normal range of motion and neck supple.  Skin:    General: Skin is warm and dry.     Coloration: Skin is not jaundiced or pale.  Neurological:     General: No focal deficit present.     Mental Status: He is alert and oriented to person, place, and time.  Psychiatric:        Mood and Affect: Mood normal.        Behavior: Behavior normal.     Procedures  Procedures  ED Course / MDM   Clinical Course as of 11/05/22 1532  Tue Nov 05, 2022  1439 After the first dose of Dilaudid patient states his pain is still 9 out of 10.  Will try a second dose. [MB]  1503 I reached out to Dr.  Leonides Schanz.  He asked if we had palliative available and maybe they could give [name] some options. [MB]  1522 Discussed with nurse practitioner Marice Potter who will get back to [name] with their recommendations regarding pain management regiment. [MB]    Clinical Course User Index [MB] Terrilee Files, MD   Medical Decision Making Amount and/or Complexity of Data Reviewed Labs: ordered.  Risk Prescription drug management.   Pain of care came and evaluated the patient.  They did place recommendations for increasing his home pain medications.  He took 2 doses of his Xtampza while here in the ED.  On assessment, patient resting in bed.  Endorses continued 8/10 severity pain. He was advised to take single dose of his oxycodone.  On further reassessment, pain has improved.  Patient does feel comfortable with discharge home at this point.  He states that he will reach out to his oncologist for ongoing pain management and possible palliative radiation.  He was given new prescriptions for his pain medication, as he will need to increase his dosing.  He was discharged in stable condition.       Gloris Manchester, MD 11/05/22 2033

## 2022-11-05 NOTE — ED Triage Notes (Signed)
Pt BIB [facility] for c/o right leg pain; pt has cancer and states he has taken all the medications he has been prescribed; pt is actively receiving chemotherapy

## 2022-11-05 NOTE — Discharge Instructions (Addendum)
Increase your Xtampza to 2 tablets twice per day.  Continue your roxicodone as needed.  Call Dr. Leonides Schanz to set up a follow-up appointment for ongoing pain control.  He may want to do palliative radiation.  Return to the emergency department for any new or worsening symptoms of concern.

## 2022-11-06 ENCOUNTER — Encounter: Payer: Self-pay | Admitting: Hematology and Oncology

## 2022-11-06 MED FILL — Dexamethasone Sodium Phosphate Inj 100 MG/10ML: INTRAMUSCULAR | Qty: 1 | Status: AC

## 2022-11-06 NOTE — Progress Notes (Signed)
[facility] CSW Progress Note  Visual merchandiser  spoke with pt's son and daughter over the phone regarding increased care needs.  Per pt's children his pain has not been well controlled at home.  Pt's children all work full time and are unable to provide pt with the supervision he requires at this time.  Per daughter pt's pain medication is also making him groggy and it is unclear if he is consistently taking all of his medication.  Following our phone conversation pt went to the ED due to uncontrolled pain and at that time his regiment was adjusted.  Pt will follow up with outpt palliative team 9/26 for further assessment.  Pt has home care in place, but has not been able to start those services yet.  Pt was scheduled to see home PT, but needed to go to the ED so this will be rescheduled.  SNF placement was discussed as it may be appropriate for a short period of time to stabilize pt.  Pt's son will also apply pt for Medicaid as it would make him eligible for PCA services.  CSW to check in with pt's children 9/27.        Rachel Moulds, LCSW Clinical Social Worker [facility]

## 2022-11-07 ENCOUNTER — Encounter: Payer: Self-pay | Admitting: Nurse Practitioner

## 2022-11-07 ENCOUNTER — Inpatient Hospital Stay (HOSPITAL_BASED_OUTPATIENT_CLINIC_OR_DEPARTMENT_OTHER): Payer: Medicare HMO | Admitting: Nurse Practitioner

## 2022-11-07 ENCOUNTER — Other Ambulatory Visit: Payer: Medicare HMO

## 2022-11-07 ENCOUNTER — Inpatient Hospital Stay: Payer: Medicare HMO

## 2022-11-07 ENCOUNTER — Inpatient Hospital Stay: Payer: Medicare HMO | Admitting: Physician Assistant

## 2022-11-07 VITALS — BP 123/82 | HR 113 | Temp 97.7°F | Resp 16 | Ht 71.0 in

## 2022-11-07 VITALS — HR 80

## 2022-11-07 DIAGNOSIS — Z5111 Encounter for antineoplastic chemotherapy: Secondary | ICD-10-CM

## 2022-11-07 DIAGNOSIS — C20 Malignant neoplasm of rectum: Secondary | ICD-10-CM

## 2022-11-07 DIAGNOSIS — Z7189 Other specified counseling: Secondary | ICD-10-CM

## 2022-11-07 DIAGNOSIS — R530 Neoplastic (malignant) related fatigue: Secondary | ICD-10-CM | POA: Diagnosis not present

## 2022-11-07 DIAGNOSIS — Z515 Encounter for palliative care: Secondary | ICD-10-CM | POA: Diagnosis not present

## 2022-11-07 DIAGNOSIS — G893 Neoplasm related pain (acute) (chronic): Secondary | ICD-10-CM | POA: Diagnosis not present

## 2022-11-07 DIAGNOSIS — E785 Hyperlipidemia, unspecified: Secondary | ICD-10-CM | POA: Diagnosis not present

## 2022-11-07 DIAGNOSIS — E119 Type 2 diabetes mellitus without complications: Secondary | ICD-10-CM | POA: Diagnosis not present

## 2022-11-07 DIAGNOSIS — Z5112 Encounter for antineoplastic immunotherapy: Secondary | ICD-10-CM | POA: Diagnosis not present

## 2022-11-07 DIAGNOSIS — C7951 Secondary malignant neoplasm of bone: Secondary | ICD-10-CM

## 2022-11-07 DIAGNOSIS — K5903 Drug induced constipation: Secondary | ICD-10-CM

## 2022-11-07 DIAGNOSIS — I10 Essential (primary) hypertension: Secondary | ICD-10-CM | POA: Diagnosis not present

## 2022-11-07 DIAGNOSIS — Z79899 Other long term (current) drug therapy: Secondary | ICD-10-CM | POA: Diagnosis not present

## 2022-11-07 DIAGNOSIS — K5900 Constipation, unspecified: Secondary | ICD-10-CM | POA: Diagnosis not present

## 2022-11-07 LAB — CBC WITH DIFFERENTIAL (CANCER CENTER ONLY)
Abs Immature Granulocytes: 0.23 10*3/uL — ABNORMAL HIGH (ref 0.00–0.07)
Basophils Absolute: 0.1 10*3/uL (ref 0.0–0.1)
Basophils Relative: 1 %
Eosinophils Absolute: 0.1 10*3/uL (ref 0.0–0.5)
Eosinophils Relative: 1 %
HCT: 29.4 % — ABNORMAL LOW (ref 39.0–52.0)
Hemoglobin: 9.2 g/dL — ABNORMAL LOW (ref 13.0–17.0)
Immature Granulocytes: 2 %
Lymphocytes Relative: 5 %
Lymphs Abs: 0.5 10*3/uL — ABNORMAL LOW (ref 0.7–4.0)
MCH: 28 pg (ref 26.0–34.0)
MCHC: 31.3 g/dL (ref 30.0–36.0)
MCV: 89.4 fL (ref 80.0–100.0)
Monocytes Absolute: 0.9 10*3/uL (ref 0.1–1.0)
Monocytes Relative: 8 %
Neutro Abs: 9 10*3/uL — ABNORMAL HIGH (ref 1.7–7.7)
Neutrophils Relative %: 83 %
Platelet Count: 571 10*3/uL — ABNORMAL HIGH (ref 150–400)
RBC: 3.29 MIL/uL — ABNORMAL LOW (ref 4.22–5.81)
RDW: 15.9 % — ABNORMAL HIGH (ref 11.5–15.5)
WBC Count: 10.8 10*3/uL — ABNORMAL HIGH (ref 4.0–10.5)
nRBC: 0 % (ref 0.0–0.2)

## 2022-11-07 LAB — CMP (CANCER CENTER ONLY)
ALT: 21 U/L (ref 0–44)
AST: 39 U/L (ref 15–41)
Albumin: 3.1 g/dL — ABNORMAL LOW (ref 3.5–5.0)
Alkaline Phosphatase: 232 U/L — ABNORMAL HIGH (ref 38–126)
Anion gap: 10 (ref 5–15)
BUN: 15 mg/dL (ref 8–23)
CO2: 28 mmol/L (ref 22–32)
Calcium: 9.1 mg/dL (ref 8.9–10.3)
Chloride: 96 mmol/L — ABNORMAL LOW (ref 98–111)
Creatinine: 0.73 mg/dL (ref 0.61–1.24)
GFR, Estimated: >60 mL/min (ref >60)
Glucose, Bld: 182 mg/dL — ABNORMAL HIGH (ref 70–99)
Potassium: 4.3 mmol/L (ref 3.5–5.1)
Sodium: 134 mmol/L — ABNORMAL LOW (ref 135–145)
Total Bilirubin: 0.7 mg/dL (ref 0.3–1.2)
Total Protein: 7.2 g/dL (ref 6.5–8.1)

## 2022-11-07 LAB — CEA (ACCESS): CEA (CHCC): 6.8 ng/mL — ABNORMAL HIGH (ref 0.00–5.00)

## 2022-11-07 LAB — MAGNESIUM: Magnesium: 1.7 mg/dL (ref 1.7–2.4)

## 2022-11-07 MED ORDER — PALONOSETRON HCL INJECTION 0.25 MG/5ML
0.2500 mg | Freq: Once | INTRAVENOUS | Status: AC
  Administered 2022-11-07: 0.25 mg via INTRAVENOUS
  Filled 2022-11-07: qty 5

## 2022-11-07 MED ORDER — SODIUM CHLORIDE 0.9 % IV SOLN
2400.0000 mg/m2 | INTRAVENOUS | Status: DC
  Administered 2022-11-07: 5000 mg via INTRAVENOUS
  Filled 2022-11-07: qty 100

## 2022-11-07 MED ORDER — SODIUM CHLORIDE 0.9 % IV SOLN
180.0000 mg/m2 | Freq: Once | INTRAVENOUS | Status: AC
  Administered 2022-11-07: 400 mg via INTRAVENOUS
  Filled 2022-11-07: qty 17.31

## 2022-11-07 MED ORDER — CLINDAMYCIN PHOSPHATE 1 % EX LOTN: TOPICAL_LOTION | CUTANEOUS | 0 refills | Status: DC

## 2022-11-07 MED ORDER — FLUOROURACIL CHEMO INJECTION 2.5 GM/50ML
400.0000 mg/m2 | Freq: Once | INTRAVENOUS | Status: AC
  Administered 2022-11-07: 850 mg via INTRAVENOUS
  Filled 2022-11-07: qty 17

## 2022-11-07 MED ORDER — SODIUM CHLORIDE 0.9 % IV SOLN
10.0000 mg | Freq: Once | INTRAVENOUS | Status: AC
  Administered 2022-11-07: 10 mg via INTRAVENOUS
  Filled 2022-11-07: qty 10
  Filled 2022-11-07: qty 1

## 2022-11-07 MED ORDER — HYDROMORPHONE HCL 1 MG/ML IJ SOLN
1.0000 mg | Freq: Once | INTRAMUSCULAR | Status: AC
  Administered 2022-11-07: 1 mg via INTRAVENOUS
  Filled 2022-11-07: qty 1

## 2022-11-07 MED ORDER — SODIUM CHLORIDE 0.9 % IV SOLN: Freq: Once | INTRAVENOUS | Status: AC

## 2022-11-07 MED ORDER — SODIUM CHLORIDE 0.9 % IV SOLN
4.0000 mg/kg | Freq: Once | INTRAVENOUS | Status: AC
  Administered 2022-11-07: 360 mg via INTRAVENOUS
  Filled 2022-11-07: qty 18

## 2022-11-07 MED ORDER — DOXYCYCLINE HYCLATE 100 MG PO TABS: 100.0000 mg | ORAL_TABLET | Freq: Two times a day (BID) | ORAL | 3 refills | Status: DC

## 2022-11-07 MED ORDER — SODIUM CHLORIDE 0.9 % IV SOLN
400.0000 mg/m2 | Freq: Once | INTRAVENOUS | Status: AC
  Administered 2022-11-07: 848 mg via INTRAVENOUS
  Filled 2022-11-07: qty 17.5

## 2022-11-07 NOTE — Progress Notes (Signed)
[facility] [phone]   [phone]  PROGRESS NOTE  Patient Care Team: [name], MD as PCP - General (Hematology and Oncology) Doreatha Massed, MD as Medical Oncologist (Medical Oncology)  DIAGNOSIS: Metastatic rectal carcinoma  ONCOLOGIC HISTORY: # Metastatic Rectal Adenocarcinoma 02/03/2022-02/11/2022: Presented to ED due to left hip pain after a mechanical fall.  02/04/2022: CT left EPP:IRJJO pathologic fracture of the left femoral neck with displacement and angulation, with underlying lytic lesions in the femoral head and neck.Additional lytic destructive lesion of the left parasymphyseal pubic bone extending throughout the superior pubic ramus to the puboacetabular junction, with pathologic fracture of the superior pubic ramus. Adjacent hypoattenuation in the pelvic component of the obturator internus muscle, suspicious for tumor involvement. Probable small lytic lesion in the inferior pubic ramus.Prominent left external iliac lymph nodes measuring up to 1.2 cm. 02/05/2022: CT CAP: Pathologic retroperitoneal and bilateral pelvic adenopathy, with lytic destructive lesions of the left superior and inferior pubic ramus and pubic body as well as a pathologic fracture through the left femoral neck. Pathologic fracture anteriorly in the right second rib. Deformity anteriorly in the right third rib probably from early pathologic fracture. Metastatic disease or myeloma strongly favored over multifocal osteomyelitis as a cause. Mildly enlarged left supraclavicular lymph node and lower thoracic periaortic lymph node. Pathologic retroperitoneal and pelvic adenopathy. 10 cm long segment of rectal wall thickening.1.1 cm exophytic lesion from the left mid kidney posterolaterally, nonspecific for complex cyst versus tumor. Bandlike atelectasis in both lungs.Small type 1 hiatal hernia.Lumbar spondylosis and degenerative disc disease causing generally mild  multilevel impingement. Sigmoid colon diverticulosis.Wall thickening in the proximal stomach body, probably secondary to nondistention. 02/06/2022: Underwent left total hip arthroplasty. Pathology revealed poorly differentiated carcinoma.  SPEP/IFE did not detect monoclonal protein. PSA normal. 02/25/2022: Underwent colonoscopy that showed malignant tumor in the rectum. Pathology confirmed poorly differentiated carcinoma.  04/05/2022: Underwent port placement 04/08/2022: Cycle 1, Day 1 of FOLFOX (held bevacizumab due to recent port placement) 04/23/2022: Cycle 2, Day 1 of FOLFOX plus bevacizumab 05/07/2022: Cycle 3, Day 1 of FOLFOX plus bevacizumab 05/21/2022: Cycle 4, Day 1 of FOLFOX plus bevacizumab. Transitioned care to Dr. Caren Hazy at [facility] 07/02/2022: Cycle 6, Day 1 of FOLFOX plus bevacizumab at [facility].  07/11/2022: NM PET CT scan showed progression of hypermetabolic osseous metastatic disease, although there is some mixed change as detailed. New hypermetabolic right femoral neck metastasis places the patient at risk for pathologic right femoral neck fracture. 07/30/2022:  Cycle 1 Day 1  FOLFIRI +Panitumumab 08/14/2022: Cycle 2 Day 1 FOLFIRI + Panitumumab 08/27/2022: Cycle 3 Day 1 FOLFIRI + Panitumumab 09/11/2022: Cycle 4 Day 1 FOLFIRI (Hold Panitumumab due to acneiform rash) 10/02/2022: Cycle 5 Day 1 FOLFIRI (Hold Panitumumab due to acneiform rash) 11/07/2022: Cycle 6 Day 1 FOLFIRI plus Panitumumab dose reduced at 4 mg/kg.   HISTORY OF PRESENTING ILLNESS:  Joseph Hogan 62 y.o. male returns for follow-up for metastatic rectal cancer. He presents today to start Cycle 6, Day 1 today. He is unaccompanied for this visit.   On exam today, Joseph Hogan reports he continues to have pain in his right hip that comes and goes. He does have sacral pain due to sitting on a hard surface for a prolonged period. He is confused with his pain medication and has only taken his short acting oxycodone 10 mg  periodically with minimal relief. He denies nausea, vomiting or bowel habit changes. He denies easy bruising or signs of  bleeding.  He denies any fevers, chills, sweats, shortness of breath, chest pain or cough.  A full 10 point ROS is otherwise negative.  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   with abnormal R wave progression noted on EKG   MRSA (methicillin resistant Staphylococcus aureus)    Pre-diabetes    Rectal carcinoma (HCC) 03/19/2022    SURGICAL HISTORY: Past Surgical History:  Procedure Laterality Date   COLONOSCOPY     HUMERUS IM NAIL Right 04/12/2022   Procedure: INTRAMEDULLARY (IM) NAIL HUMERAL;  Surgeon: Bjorn Pippin, MD;  Location: [facility];  Service: Orthopedics;  Laterality: Right;   IR IMAGING GUIDED PORT INSERTION  04/05/2022   TOTAL HIP ARTHROPLASTY Left 02/06/2022   Procedure: TOTAL HIP ARTHROPLASTY;  Surgeon: Joen Laura, MD;  Location: [facility];  Service: Orthopedics;  Laterality: Left;    SOCIAL HISTORY: Social History   Socioeconomic History   Marital status: Divorced    Spouse name: Not on file   Number of children: Not on file   Years of education: Not on file   Highest education level: Not on file  Occupational History   Not on file  Tobacco Use   Smoking status: Never   Smokeless tobacco: Never  Vaping Use   Vaping status: Never Used  Substance and Sexual Activity   Alcohol use: No   Drug use: No   Sexual activity: Not Currently  Other Topics Concern   Not on file  Social History Narrative   Not on file   Social Determinants of Health   Financial Resource Strain: Not on file  Food Insecurity: No Food Insecurity (10/15/2022)   Hunger Vital Sign    Worried About Running Out of Food in the Last Year: Never true    Ran Out of Food in the Last Year: Never true  Transportation Needs: No Transportation Needs (10/15/2022)    PRAPARE - Administrator, Civil Service (Medical): No    Lack of Transportation (Non-Medical): No  Physical Activity: Not on file  Stress: Not on file  Social Connections: Not on file  Intimate Partner Violence: Not At Risk (10/15/2022)   Humiliation, Afraid, Rape, and Kick questionnaire    Fear of Current or Ex-Partner: No    Emotionally Abused: No    Physically Abused: No    Sexually Abused: No    FAMILY HISTORY: Family History  Problem Relation Age of Onset   Stroke Mother    Hypertension Mother    Heart disease Father    Alzheimer's disease Father    Hypertension Brother    Alcohol abuse Brother    Cancer Maternal Aunt    Heart disease Maternal Aunt    Cancer Maternal Uncle    Heart disease Maternal Uncle    Cancer Paternal Aunt    Heart disease Paternal Aunt    Cancer Paternal Uncle    Heart disease Paternal Uncle    Heart disease Paternal Grandmother    Alzheimer's disease Paternal Grandfather    Stomach cancer Neg Hx    Rectal cancer Neg Hx    Esophageal cancer Neg Hx    Colon cancer Neg Hx     ALLERGIES:  is allergic to codeine, dilaudid [hydromorphone hcl], and lisinopril.  MEDICATIONS:  Current Outpatient Medications  Medication Sig Dispense Refill   clindamycin (CLEOCIN-T) 1 % lotion  Apply to rash as needed. 60 mL 0   doxycycline (VIBRA-TABS) 100 MG tablet Take 1 tablet (100 mg total) by mouth 2 (two) times daily. 60 tablet 3   cyanocobalamin (VITAMIN B12) 1000 MCG tablet Take 1 tablet (1,000 mcg total) by mouth daily. 30 tablet 1   gabapentin (NEURONTIN) 300 MG capsule Take 1 capsule (300 mg total) by mouth at bedtime. (Patient not taking: Reported on 07/31/2022) 30 capsule 1   lidocaine-prilocaine (EMLA) cream Apply a quarter-sized amount to port a cath site and cover with plastic wrap one hour prior to infusion appointments (Patient not taking: Reported on 09/11/2022) 30 g 3   ondansetron (ZOFRAN) 8 MG tablet Take 2 tablets (16 mg total) by  mouth every 8 (eight) hours as needed for nausea or vomiting. (Patient not taking: Reported on 08/14/2022) 20 tablet 3   oxyCODONE (OXY IR/ROXICODONE) 5 MG immediate release tablet Take 1-2 tablets (5-10 mg total) by mouth every 6 (six) hours as needed for up to 5 days for severe pain. 40 tablet 0   oxyCODONE ER (XTAMPZA ER) 13.5 MG C12A Take 2 capsules by mouth 2 (two) times daily for 7 days. 28 capsule 0   pantoprazole (PROTONIX) 20 MG tablet Take 1 tablet (20 mg total) by mouth daily. 30 tablet 0   polyethylene glycol (MIRALAX / GLYCOLAX) 17 g packet Take 17 g by mouth daily as needed for mild constipation. 14 each 0   senna-docusate (SENOKOT-S) 8.6-50 MG tablet Take 1 tablet by mouth 2 (two) times daily. 30 tablet 0   No current facility-administered medications for this visit.    REVIEW OF SYSTEMS:   Constitutional: ( - ) fevers, ( - )  chills , ( - ) night sweats Eyes: ( - ) blurriness of vision, ( - ) double vision, ( - ) watery eyes Ears, nose, mouth, throat, and face: ( - ) mucositis, ( - ) sore throat Respiratory: ( - ) cough, ( - ) dyspnea, ( - ) wheezes Cardiovascular: ( - ) palpitation, ( - ) chest discomfort, ( - ) lower extremity swelling Gastrointestinal:  ( - ) nausea, ( - ) heartburn, ( - ) change in bowel habits Skin: ( + ) abnormal skin rashes Lymphatics: ( - ) new lymphadenopathy, ( - ) easy bruising Neurological: ( - ) numbness, ( - ) tingling, ( - ) new weaknesses Behavioral/Psych: ( - ) mood change, ( - ) new changes  All other systems were reviewed with the patient and are negative.  PHYSICAL EXAMINATION: ECOG PERFORMANCE STATUS: 2 - Symptomatic, <50% confined to bed  Vitals:   11/07/22 0824  BP: 123/82  Pulse: (!) 113  Resp: 16  Temp: 97.7 F (36.5 C)  SpO2: 96%    Filed Weights     GENERAL: well appearing male in NAD. Exam performed in wheelchair.  SKIN: skin color, texture, turgor are normal,or significant lesions.   EYES: conjunctiva are pink and  non-injected, sclera clear LUNGS: clear to auscultation and percussion with normal breathing effort HEART: regular rate & rhythm and no murmurs and no lower extremity edema Musculoskeletal: no cyanosis of digits and no clubbing  PSYCH: alert & oriented x 3, fluent speech NEURO: no focal motor/sensory deficits  LABORATORY DATA:  I have reviewed the data as listed    Latest Ref Rng & Units 11/07/2022    7:44 AM 11/05/2022    2:00 PM 10/16/2022    5:00 AM  CBC  WBC 4.0 - 10.5 K/uL  10.8  8.2  4.3   Hemoglobin 13.0 - 17.0 g/dL 9.2  8.8  9.0   Hematocrit 39.0 - 52.0 % 29.4  27.5  28.0   Platelets 150 - 400 K/uL 571  449  172        Latest Ref Rng & Units 11/07/2022    7:44 AM 11/05/2022    2:00 PM 10/16/2022    5:00 AM  CMP  Glucose 70 - 99 mg/dL 841  324  401   BUN 8 - 23 mg/dL 15  11  19    Creatinine 0.61 - 1.24 mg/dL 0.27  2.53  6.64   Sodium 135 - 145 mmol/L 134  134  133   Potassium 3.5 - 5.1 mmol/L 4.3  3.7  3.7   Chloride 98 - 111 mmol/L 96  96  102   CO2 22 - 32 mmol/L 28  26  22    Calcium 8.9 - 10.3 mg/dL 9.1  8.3  8.1   Total Protein 6.5 - 8.1 g/dL 7.2     Total Bilirubin 0.3 - 1.2 mg/dL 0.7     Alkaline Phos 38 - 126 U/L 232     AST 15 - 41 U/L 39     ALT 0 - 44 U/L 21        PATHOLOGY: Rectum, biopsy POORLY DIFFERENTIATED CARCINOMA. SEE NOTE. Diagnosis Note Addendum: Immunohistochemistry shows the carcinoma is positive with MOC-31, cytokeratin 20 and CDX2. Tumor is negative with cytokeratin 7, cytokeratin 5/6, p40, TTF-1, Napsin A, prostate-specific antigen and prostein. The immunophenotype is consistent with primary colorectal adenocarcinoma.  RADIOGRAPHIC STUDIES: CT SHOULDER LEFT WO CONTRAST  Result Date: 10/19/2022 CLINICAL DATA:  Shoulder pain, chronic, no prior imaging shoulder pain. History of metastatic rectal cancer EXAM: CT OF THE UPPER LEFT EXTREMITY WITHOUT CONTRAST TECHNIQUE: Multidetector CT imaging of the upper left extremity was performed according  to the standard protocol. RADIATION DOSE REDUCTION: This exam was performed according to the departmental dose-optimization program which includes automated exposure control, adjustment of the mA and/or kV according to patient size and/or use of iterative reconstruction technique. COMPARISON:  X-ray 01/27/2020 FINDINGS: Bones/Joint/Cartilage Multiple mixed lytic and sclerotic bone lesions are present. Large somewhat permeative lesion within the left humeral head and neck measures approximally 5.0 cm in size (series 6, image 78). No pathologic fracture. Additional lesions include permeative lesion in the medial aspect of the scapular body, lateral left second rib lesion, and lateral left third rib lesion. The lateral left third rib lesion is expansile and measures approximately 4 x 2 cm in size. Probable lesion within the T4 vertebral body. Glenohumeral joint alignment is maintained without dislocation. Humeral head is high-riding and abuts the undersurface of the acromion. 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 Small glenohumeral joint effusion. Ligaments Suboptimally assessed by CT. Muscles and Tendons Supraspinatus and infraspinatus muscle atrophy compatible with chronic underlying rotator cuff tear. The teres minor muscle is also atrophic. Soft tissues No fluid collection or hematoma.  No left axillary lymphadenopathy. IMPRESSION: 1. Multiple mixed lytic and sclerotic bone lesions, compatible with known osseous metastatic disease. Large somewhat permeative lesion within the left humeral head and neck measures approximally 5.0 cm in size. No pathologic fracture. 2. Additional metastatic lesions include the left scapula, left second rib, left third rib, and T4 vertebral body. 3. 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 4. Findings compatible with chronic underlying rotator cuff tears. Electronically Signed   By: Duanne Guess D.O.   On: 10/19/2022  12:41   CT CHEST ABDOMEN PELVIS W CONTRAST  Result Date: 10/14/2022 CLINICAL DATA:  Colorectal carcinoma staging. Bilateral leg pain which is severe. 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 * Tracking Code: BO * EXAM: CT CHEST, ABDOMEN, AND PELVIS WITH CONTRAST TECHNIQUE: Multidetector CT imaging of the chest, abdomen and pelvis was performed following the standard protocol during bolus administration of intravenous contrast. RADIATION DOSE REDUCTION: This exam was performed according to the departmental dose-optimization program which includes automated exposure control, adjustment of the mA and/or kV according to patient size and/or use of iterative reconstruction technique. CONTRAST:  OMNIPAQUE IOHEXOL 300 MG/ML  SOLN COMPARISON:  CT pelvis 07/23/2022, PET-CT scan 07/11/2022 FINDINGS: CT CHEST FINDINGS CT CHEST FINDINGS Cardiovascular: Port in the anterior chest wall with tip in distal SVC. No significant vascular findings. Normal heart size. No pericardial effusion. Mediastinum/Nodes: No axillary or supraclavicular adenopathy. No mediastinal or hilar adenopathy. No pericardial fluid. Esophagus normal. Lungs/Pleura: No suspicious pulmonary nodules. Normal pleural. Airways normal. Musculoskeletal: Expansile rib lesions hypermetabolic on comparison PET-CT scan. Lesion the anterior LEFT third rib in the anterior RIGHT fourth rib. Findings similar to PET-CT 07/11/2022 CT ABDOMEN AND PELVIS FINDINGS Hepatobiliary: No focal hepatic lesion. Pancreas: Pancreas is normal. No ductal dilatation. No pancreatic inflammation. Spleen: Normal spleen Adrenals/urinary tract: Adrenal glands normal. Simple fluid attenuation cyst in the RIGHT kidney. Right Bosniak I benign renal cyst. No follow-up imaging is recommended. JACR 2018 Feb; 264-273, Management of the Incidental Renal Mass on CT, [employer] 2021; [id number], Bosniak Classification of Cystic Renal Masses, Version 2019. Ureters and  bladder normal Stomach/Bowel: Stomach, small bowel, appendix, and cecum are normal. Moderate volume stool throughout the entire colon. Vascular/Lymphatic: Abdominal aorta is normal caliber with atherosclerotic calcification. There is no retroperitoneal or periportal lymphadenopathy. No pelvic lymphadenopathy. Reproductive: Unremarkable Other: Haziness in the presacral space may relate to prior radiation treatment. Musculoskeletal: Multiple expansile metastatic lesions in the pelvis. These lesions are bulky benign signal change from prior. For example lesion involving the RIGHT inferior ischium measuring 7.5 by 4.1 cm (image 118/3. These lesions are hypermetabolic on comparison PET-CT scan. Extensive involvement of the LEFT pelvis and sacrum. No pathologic fracture. LEFT hip prosthetic. IMPRESSION: 1. No pulmonary metastasis.  No lymphadenopathy 2. Multiple expansile rib lesions again noted. 3. No evidence of bowel obstruction. Moderate volume stool throughout the colon suggest constipation. 4. Expansile metastatic lesions in the LEFT pelvis. These lesions are extremely bulky but not changed from prior. 5. No acute osseous findings. Electronically Signed   By: Genevive Bi M.D.   On: 10/14/2022 19:30   CT L-SPINE NO CHARGE  Result Date: 10/14/2022 CLINICAL DATA:  saw on the [facility]th at [facility] for the same. Pt here with complaints of bilateral leg pain so severe that he is unable to sleep. Pt tearful in triage and request that he not get a IM shot of Dilaudid again. Colon cancer staging. EXAM: CT LUMBAR SPINE WITHOUT CONTRAST TECHNIQUE: Multidetector CT imaging of the lumbar spine was performed without intravenous contrast administration. Multiplanar CT image reconstructions were also generated. RADIATION DOSE REDUCTION: This exam was performed according to the departmental dose-optimization program which includes automated exposure control, adjustment of the mA and/or kV according to patient size and/or use of  iterative reconstruction technique. COMPARISON:  X-ray lumbar spine 07/23/2022 FINDINGS: Segmentation: 5 lumbar type vertebrae with a sacralized L5. Alignment: Normal. Vertebrae: Multilevel mild to moderate degenerative changes of the spine. Posterior disc osteophyte complex formation at the L4-L5 level.  No associated severe osseous neural foraminal or central canal stenosis. No acute fracture. Poorly defined wide zone of transition sclerotic lesions of the sacrum, L5 vertebral body, iliac bones suggestive of osseous metastases in the setting of colon cancer. Question similar finding of the spinous process of the L2 vertebral body. Paraspinal and other soft tissues: Negative. Disc levels: Maintained. IMPRESSION: 1. No acute displaced fracture or traumatic listhesis of the lumbar spine. 2. Lumbosacral and iliac findings suggestive of a sclerotic osseous metastases in the setting of colon cancer. 3. Please see separately dictated CT chest, abdomen, pelvis 10/14/2022. Electronically Signed   By: Tish Frederickson M.D.   On: 10/14/2022 19:17   US SCROTUM W/DOPPLER  Result Date: 10/11/2022 CLINICAL DATA:  Four day history of scrotal swelling EXAM: SCROTAL ULTRASOUND DOPPLER ULTRASOUND OF THE TESTICLES TECHNIQUE: Complete ultrasound examination of the testicles, epididymis, and other scrotal structures was performed. Color and spectral Doppler ultrasound were also utilized to evaluate blood flow to the testicles. COMPARISON:  None Available. FINDINGS: Right testicle Measurements: 4.0 x 2.8 x 2.4 cm, 14.5 mL. No mass or microlithiasis visualized. Left testicle Measurements: 3.6 x 2.5 x 2.4 cm, 11.1 mL. No mass or microlithiasis visualized. Right epididymis:  Small epididymal head cyst measures 4 x 3 x 3 mm. Left epididymis:  Normal in size and appearance. Hydrocele:  Small bilateral hydroceles. Varicocele:  None visualized. Pulsed Doppler interrogation of both testes demonstrates normal low resistance arterial and venous  waveforms bilaterally. Diffuse soft tissue edema of the overlying scrotum. IMPRESSION: 1. Diffuse soft tissue edema of the overlying scrotum. 2. Small bilateral hydroceles. 3. No evidence of testicular torsion. Electronically Signed   By: Agustin Cree M.D.   On: 10/11/2022 16:50   US Venous Img Lower Right (DVT Study)  Result Date: 10/11/2022 CLINICAL DATA:  Leg swelling and pain since Monday. History of rectal cancer. EXAM: Right LOWER EXTREMITY VENOUS DOPPLER ULTRASOUND TECHNIQUE: Gray-scale sonography with compression, as well as color and duplex ultrasound, were performed to evaluate the deep venous system(s) from the level of the common femoral vein through the popliteal and proximal calf veins. COMPARISON:  Ultrasound 06/04/2022 FINDINGS: VENOUS Normal compressibility of the common femoral, superficial femoral, and popliteal veins, as well as the visualized calf veins. Visualized portions of profunda femoral vein and great saphenous vein unremarkable. No filling defects to suggest DVT on grayscale or color Doppler imaging. Doppler waveforms show normal direction of venous flow, normal respiratory plasticity and response to augmentation. Limited views of the contralateral common femoral vein are unremarkable. OTHER Few prominent inguinal nodes identified. These are enlarged up to 2.3 x 2.6 cm but this has a fatty hilum and normal morphology. These could be reactive. Please correlate clinical findings. There also soft tissue edema in the calf. Limitations: none IMPRESSION: No evidence of right lower extremity DVT. Calf edema with enlarged but normally morphologic inguinal lymph nodes. Please correlate with clinical findings Electronically Signed   By: Karen Kays M.D.   On: 10/11/2022 16:47    ASSESSMENT & PLAN Joseph Hogan is a 62 y.o. male who presents to the clinic for a follow up for rectal carcinoma.   #Metastatic rectal carcinoma involving bone and lymph nodes: --Confirmed with colonoscopy on  02/25/2022 that showed malignant rectal mass --Requested [facility] for molecular testing including KRAS/NRAS, BRAF, MMR/MSI status.  --We reviewed that his cancer is incurable and mainstay treatment is chemotherapy. --Recommend chemotherapy regimen FOLFOX plus Bevacizumab q 2 weeks, started on 04/08/2022.  --progression  noted on last PET/CT scan from 07/11/2022. D/c FOLFOX + Bev and start FOLFIRI + Panitumumab on 07/31/2022. PLAN: --Due for Cycle 6, Day 1 today --Labs from today reviewed and adequate for treatment. WBC 10.8, hemoglobin 9.2, MCV 89.4, and platelets of 571. Creatinine and LFTs adequate.  --Proceed with treatment today without any dose modifications. Plan to resume Panitumumab today but dose reduced at 4 mg/kg.  --RTC in 2 weeks before Cycle 7, Day 1 of FOLFIRI + Panitumumab   #Acneiform rash-- resolved --Secondary to panitumumab --Will resume panitumumab dose reduced at 4 mg/kg.  --Refilled doxycycline 100 mg BID and Cleocin gel for spot treatment. Advised him the importance of taking both medications to minimize severity of rash.   #Microcytic anemia: #Thrombocytosis: --patient received IV venofer 200 mg once a week x 3  #Right shoulder pain # Leg Pain  # Bone Pain from Metastatic Cancer  --Right shoulder xray form 04/08/2022 showed destructive lesion within the proximal to mid right humerus shaft with acute to subacture pathologic fracture. --Patient underwent pinning of right humerus on 04/12/2022 --Received palliative radiation from 05/14/2022-06/03/2022 to right humerus, L3 spine and pelvis --Current pain regimen includes Xtampza 27 mg q 12 hours, oxycodone 5-10 mg q 6 hours for breakthrough pain.   --Meeting with Palliative care team today.  #Supportive Care -- chemotherapy education complete -- port placement complete.  -- zofran 8mg  q8H PRN and compazine 10mg  PO q6H for nausea -- EMLA cream for port  No orders of the defined types were placed in this  encounter.   All questions were answered. The patient knows to call the clinic with any problems, questions or concerns.  I have spent a total of 30 minutes minutes of face-to-face and non-face-to-face time, preparing to see the patient, performing a medically appropriate examination, counseling and educating the patient, ordering medications/tests/procedures,  documenting clinical information in the electronic health record,  and care coordination.    Georga Kaufmann PA-C Dept of Hematology and Oncology [facility] at [facility] Phone: [phone]

## 2022-11-07 NOTE — Patient Instructions (Signed)
Please take your medications as prescribed:   Pain: Long acting: Xtampza 13.5 mg capsule. Take 2 capsules every 12 hours. Don't skip doses.  Short acting: Oxycodone 5 mg tablet. Take 2 tablets every 6 hours as needed for breakthrough pain.   Chemotherapy induced rash: Doxycycline 100 mg tablet. Take 1 tablet every 12 hours. Don't skip doses.  Cleocin gel. Spot treatment on the skin for rash.

## 2022-11-07 NOTE — Patient Instructions (Addendum)
Here is what was discussed in today's visit: - continue your Xtampza take two capsules at 9am and 9pm, this is you long acting or slow release pain control medication. It should be taken the same time every day, twice a day. - continue to take your oxycodone immediate release 1 OR 2 tablets every 4-6 hours as needed for pain control - DO NOT BREAK YOUR PAIN PILLS IN HALF - take your Protonix daily, it helps to coat and protect your stomach from the pain pills - take 1 scoop of mirilax daily at bedtime to help with constipation - take your sennaKot, 2 pills at bedtime to also help with constipation  - pick up and take your doxycycline 1 tablet 2 times a day to help with your rash from your chemo, that way you can continue to get your chemo, take it at 9am and 9pm with your xtampza - use the clindamycin cream on your rashes as needed for spot treatment - call the office 2-3 days before you run out of pain medication to get a refill in time as pharmacies can take several days to fill pain medications and/or get them in stock - call the office at [phone] with any questions or concerns

## 2022-11-07 NOTE — Patient Instructions (Signed)
[facility]  Discharge Instructions: Thank you for choosing [facility] to provide your oncology and hematology care.   If you have a lab appointment with the Cancer Center, please go directly to the Cancer Center and check in at the registration area.   Wear comfortable clothing and clothing appropriate for easy access to any Portacath or PICC line.   We strive to give you quality time with your provider. You may need to reschedule your appointment if you arrive late (15 or more minutes).  Arriving late affects you and other patients whose appointments are after yours.  Also, if you miss three or more appointments without notifying the office, you may be dismissed from the clinic at the provider's discretion.      For prescription refill requests, have your pharmacy contact our office and allow 72 hours for refills to be completed.    Today you received the following chemotherapy and/or immunotherapy agents irinotecan, vectibix, leucovorin, fluorourcil      To help prevent nausea and vomiting after your treatment, we encourage you to take your nausea medication as directed.  BELOW ARE SYMPTOMS THAT SHOULD BE REPORTED IMMEDIATELY: *FEVER GREATER THAN 100.4 F (38 C) OR HIGHER *CHILLS OR SWEATING *NAUSEA AND VOMITING THAT IS NOT CONTROLLED WITH YOUR NAUSEA MEDICATION *UNUSUAL SHORTNESS OF BREATH *UNUSUAL BRUISING OR BLEEDING *URINARY PROBLEMS (pain or burning when urinating, or frequent urination) *BOWEL PROBLEMS (unusual diarrhea, constipation, pain near the anus) TENDERNESS IN MOUTH AND THROAT WITH OR WITHOUT PRESENCE OF ULCERS (sore throat, sores in mouth, or a toothache) UNUSUAL RASH, SWELLING OR PAIN  UNUSUAL VAGINAL DISCHARGE OR ITCHING   Items with * indicate a potential emergency and should be followed up as soon as possible or go to the Emergency Department if any problems should occur.  Please show the CHEMOTHERAPY ALERT CARD  or IMMUNOTHERAPY ALERT CARD at check-in to the Emergency Department and triage nurse.  Should you have questions after your visit or need to cancel or reschedule your appointment, please contact [facility]  Dept: [phone]  and follow the prompts.  Office hours are 8:00 a.m. to 4:30 p.m. Monday - Friday. Please note that voicemails left after 4:00 p.m. may not be returned until the following business day.  We are closed weekends and major holidays. You have access to a nurse at all times for urgent questions. Please call the main number to the clinic Dept: [phone] and follow the prompts.   For any non-urgent questions, you may also contact your provider using MyChart. We now offer e-Visits for anyone 60 and older to request care online for non-urgent symptoms. For details visit [email or web address].   Also download the MyChart app! Go to the app store, search "MyChart", open the app, select [facility], and log in with your MyChart username and password.

## 2022-11-08 ENCOUNTER — Telehealth: Payer: Self-pay

## 2022-11-08 ENCOUNTER — Encounter: Payer: Self-pay | Admitting: Hematology and Oncology

## 2022-11-08 ENCOUNTER — Inpatient Hospital Stay: Payer: Medicare HMO | Admitting: Licensed Clinical Social Worker

## 2022-11-08 DIAGNOSIS — M1990 Unspecified osteoarthritis, unspecified site: Secondary | ICD-10-CM | POA: Diagnosis not present

## 2022-11-08 DIAGNOSIS — I10 Essential (primary) hypertension: Secondary | ICD-10-CM | POA: Diagnosis not present

## 2022-11-08 DIAGNOSIS — D630 Anemia in neoplastic disease: Secondary | ICD-10-CM | POA: Diagnosis not present

## 2022-11-08 DIAGNOSIS — C20 Malignant neoplasm of rectum: Secondary | ICD-10-CM

## 2022-11-08 DIAGNOSIS — E785 Hyperlipidemia, unspecified: Secondary | ICD-10-CM | POA: Diagnosis not present

## 2022-11-08 DIAGNOSIS — C7951 Secondary malignant neoplasm of bone: Secondary | ICD-10-CM | POA: Diagnosis not present

## 2022-11-08 DIAGNOSIS — G893 Neoplasm related pain (acute) (chronic): Secondary | ICD-10-CM | POA: Diagnosis not present

## 2022-11-08 DIAGNOSIS — D500 Iron deficiency anemia secondary to blood loss (chronic): Secondary | ICD-10-CM | POA: Diagnosis not present

## 2022-11-08 DIAGNOSIS — E119 Type 2 diabetes mellitus without complications: Secondary | ICD-10-CM | POA: Diagnosis not present

## 2022-11-08 NOTE — Telephone Encounter (Signed)
Verified with Huntley Dec from [facility] that we are OK to proceed with pain management, disease management, once a week for 5 weeks as well as cream for a deep tissue injury.

## 2022-11-08 NOTE — Progress Notes (Signed)
[facility] CSW Progress Note  Visual merchandiser  spoke with pt's son over the phone.  Per son pt's pain was better controlled yesterday evening than it previously had been.  Son confirms he received the pill box as well as the instructions regarding medication administration provided by palliative care team.  Pt's application for Medicaid was denied.  CSW discussed the different scenarios that may contribute to pt being denied Medicaid and provided contact information for the DSS outstation to re-apply at a later time if it becomes appropriate.  Son also provided w/ contact details for [facility] to assist w/ exploring long term care options.  CSW requested medical team reach out to [facility] to inform pt was not hospitalized as pt was in the ED when the first visit was scheduled and according to pt he has not heard from the home care agency.  CSW provided contact details for Marijean Niemann for son to follow up.  CSW to remain available as appropriate throughout duration of treatment to provide support.        Rachel Moulds, LCSW Clinical Social Worker [facility]

## 2022-11-09 ENCOUNTER — Inpatient Hospital Stay: Payer: Medicare HMO

## 2022-11-09 VITALS — BP 130/81 | HR 85 | Temp 98.1°F | Resp 15

## 2022-11-09 DIAGNOSIS — I10 Essential (primary) hypertension: Secondary | ICD-10-CM | POA: Diagnosis not present

## 2022-11-09 DIAGNOSIS — E785 Hyperlipidemia, unspecified: Secondary | ICD-10-CM | POA: Diagnosis not present

## 2022-11-09 DIAGNOSIS — C20 Malignant neoplasm of rectum: Secondary | ICD-10-CM | POA: Diagnosis not present

## 2022-11-09 DIAGNOSIS — E119 Type 2 diabetes mellitus without complications: Secondary | ICD-10-CM | POA: Diagnosis not present

## 2022-11-09 DIAGNOSIS — Z5111 Encounter for antineoplastic chemotherapy: Secondary | ICD-10-CM | POA: Diagnosis not present

## 2022-11-09 DIAGNOSIS — K5900 Constipation, unspecified: Secondary | ICD-10-CM | POA: Diagnosis not present

## 2022-11-09 DIAGNOSIS — C7951 Secondary malignant neoplasm of bone: Secondary | ICD-10-CM | POA: Diagnosis not present

## 2022-11-09 DIAGNOSIS — G893 Neoplasm related pain (acute) (chronic): Secondary | ICD-10-CM | POA: Diagnosis not present

## 2022-11-09 DIAGNOSIS — Z5112 Encounter for antineoplastic immunotherapy: Secondary | ICD-10-CM | POA: Diagnosis not present

## 2022-11-09 MED ORDER — SODIUM CHLORIDE 0.9% FLUSH
10.0000 mL | INTRAVENOUS | Status: DC | PRN
  Administered 2022-11-09: 10 mL

## 2022-11-09 MED ORDER — HEPARIN SOD (PORK) LOCK FLUSH 100 UNIT/ML IV SOLN
500.0000 [IU] | Freq: Once | INTRAVENOUS | Status: AC | PRN
  Administered 2022-11-09: 500 [IU]

## 2022-11-12 ENCOUNTER — Telehealth: Payer: Self-pay

## 2022-11-12 DIAGNOSIS — D630 Anemia in neoplastic disease: Secondary | ICD-10-CM | POA: Diagnosis not present

## 2022-11-12 DIAGNOSIS — E119 Type 2 diabetes mellitus without complications: Secondary | ICD-10-CM | POA: Diagnosis not present

## 2022-11-12 DIAGNOSIS — G893 Neoplasm related pain (acute) (chronic): Secondary | ICD-10-CM | POA: Diagnosis not present

## 2022-11-12 DIAGNOSIS — D500 Iron deficiency anemia secondary to blood loss (chronic): Secondary | ICD-10-CM | POA: Diagnosis not present

## 2022-11-12 DIAGNOSIS — E785 Hyperlipidemia, unspecified: Secondary | ICD-10-CM | POA: Diagnosis not present

## 2022-11-12 DIAGNOSIS — I10 Essential (primary) hypertension: Secondary | ICD-10-CM | POA: Diagnosis not present

## 2022-11-12 DIAGNOSIS — C20 Malignant neoplasm of rectum: Secondary | ICD-10-CM | POA: Diagnosis not present

## 2022-11-12 DIAGNOSIS — M1990 Unspecified osteoarthritis, unspecified site: Secondary | ICD-10-CM | POA: Diagnosis not present

## 2022-11-12 DIAGNOSIS — C7951 Secondary malignant neoplasm of bone: Secondary | ICD-10-CM | POA: Diagnosis not present

## 2022-11-12 NOTE — Telephone Encounter (Signed)
Spoke to patient's son informing him that his FMLA documents had been completed and faxed to the company. Fax conformation received. Emailed copy of documents to the son as requested.

## 2022-11-12 NOTE — Telephone Encounter (Signed)
Pt's daughter called to discuss pain management plans for pt and had questions regarding the medication regimen he is on. She states that she wanted to clarify what the pain management plan was after pt runs out of his Xtampza. This RN states that the on-going pain management should be evaluated by a provider here for clarification. Pt's daughter stated understanding.  Pt's daughter states that the pt has been having softer stools appx 4 times per day and has been taking Senna-Docusate two pills before bed. This RN noticed in the medication list that it states to take "1 tablet by mouth two times daily."   This RN states that she will discuss with the team for further clarification on his medication regimen. Pt's daughter verbalized understanding and requested a MyChart message for information.   Upon further investigation, this RN found that he has been referred to Palliative Care for pain management and stool softeners. This RN sent a MyChart message to pt's chart with information regarding his visit on 09/26 with recommended pain and bowel regimen.   This RN advised if either of them have any further concerns or questions regarding the bowel or pain management plan, that it is being managed by Lowella Bandy, NP in Palliative Care and to call [phone] as provided in the note.

## 2022-11-15 DIAGNOSIS — E785 Hyperlipidemia, unspecified: Secondary | ICD-10-CM | POA: Diagnosis not present

## 2022-11-15 DIAGNOSIS — M1990 Unspecified osteoarthritis, unspecified site: Secondary | ICD-10-CM | POA: Diagnosis not present

## 2022-11-15 DIAGNOSIS — E119 Type 2 diabetes mellitus without complications: Secondary | ICD-10-CM | POA: Diagnosis not present

## 2022-11-15 DIAGNOSIS — C7951 Secondary malignant neoplasm of bone: Secondary | ICD-10-CM | POA: Diagnosis not present

## 2022-11-15 DIAGNOSIS — D500 Iron deficiency anemia secondary to blood loss (chronic): Secondary | ICD-10-CM | POA: Diagnosis not present

## 2022-11-15 DIAGNOSIS — D630 Anemia in neoplastic disease: Secondary | ICD-10-CM | POA: Diagnosis not present

## 2022-11-15 DIAGNOSIS — I10 Essential (primary) hypertension: Secondary | ICD-10-CM | POA: Diagnosis not present

## 2022-11-15 DIAGNOSIS — G893 Neoplasm related pain (acute) (chronic): Secondary | ICD-10-CM | POA: Diagnosis not present

## 2022-11-15 DIAGNOSIS — C20 Malignant neoplasm of rectum: Secondary | ICD-10-CM | POA: Diagnosis not present

## 2022-11-18 NOTE — Progress Notes (Unsigned)
Palliative Medicine [facility]  [phone] [phone]   Name: Joseph Hogan Date: 11/18/2022 MRN: [id number]  DOB: 13-May-1960  Patient Care Team: [name], MD as PCP - General (Hematology and Oncology) Doreatha Massed, MD as Medical Oncologist (Medical Oncology) Pickenpack-Cousar, Arty Baumgartner, NP as Nurse Practitioner (Hospice and Palliative Medicine)    INTERVAL HISTORY: Joseph Hogan is a 62 y.o. 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 Palliative ask to see for symptom management and goals of care.   SOCIAL HISTORY:     reports that he has never smoked. He has never used smokeless tobacco. He reports that he does not drink alcohol and does not use drugs.  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  MEDICATIONS:  Current Outpatient Medications  Medication Sig Dispense Refill   clindamycin (CLEOCIN-T) 1 % lotion Apply to rash as needed. 60 mL 0   cyanocobalamin (VITAMIN B12) 1000 MCG tablet Take 1 tablet (1,000 mcg total) by mouth daily. 30 tablet 1   doxycycline (VIBRA-TABS) 100 MG tablet Take 1 tablet (100 mg total) by mouth 2 (two) times daily. 60 tablet 3   gabapentin (NEURONTIN) 300 MG capsule Take 1 capsule (300 mg total) by mouth at bedtime. (Patient not taking: Reported on 07/31/2022) 30 capsule 1   lidocaine-prilocaine (EMLA) cream Apply a quarter-sized amount to port  a cath site and cover with plastic wrap one hour prior to infusion appointments (Patient not taking: Reported on 09/11/2022) 30 g 3   ondansetron (ZOFRAN) 8 MG tablet Take 2 tablets (16 mg total) by mouth every 8 (eight) hours as needed for nausea or vomiting. (Patient not taking: Reported on 08/14/2022) 20 tablet 3   pantoprazole (PROTONIX) 20 MG tablet Take 1 tablet (20 mg total) by mouth daily. 30 tablet 0   polyethylene glycol (MIRALAX / GLYCOLAX) 17 g packet Take 17 g by mouth daily as needed for mild constipation. 14 each 0   senna-docusate (SENOKOT-S) 8.6-50 MG tablet Take 1 tablet by mouth 2 (two) times daily. 30 tablet 0   No current facility-administered medications for this visit.    VITAL SIGNS: There were no vitals taken for this visit. There were no vitals filed for this visit.  Estimated body mass index is 29.55 kg/m as calculated from the following:   Height as of 11/07/22: 5\' 11"  (1.803 m).   Weight as of 11/05/22: 211 lb 13.8 oz (96.1 kg).   PERFORMANCE STATUS (ECOG) : 2 - Symptomatic, <50% confined to bed   Physical Exam General: NAD, in recliner Cardiovascular: regular rate and rhythm Pulmonary: normal breathing pattern Abdomen: soft, nontender, + bowel sounds Extremities: no edema, no joint deformities Skin: no rashes Neurological: AAO x3  IMPRESSION: I saw Joseph Hogan during his infusion. No family present. Patient is complaining of right hip pain that radiates down his leg.  The has nausea, vomiting, constipation, or diarrhea.  States  he has been unable to do things around the home due to his significant pain over the past several days.  Denies any recent injury to the area.  Neoplasm related pain Joseph Hogan complains of uncontrolled pain in his right hip which radiates down his right leg, lower back and neck radiating to his shoulders. Describes as sharp, radiating, stabbing, burning, and throbbing.  Patient states pain worsens with movement and activity. He was  not taking as needed Oxycodone. Mentions he was able to go several days without taking. Has been taking Xtampza as prescribed and tolerating well. Pain started 4 days prior and was intense. Has since taken Oxycodone around the clock however with minimal relief over the past 3 days. We discussed paying attention to his body and not allowing pain to get out of control. He verbalized understanding.   We reviewed regimen at length. He has his Oxycodone pill bottle present with him. Pill count completed. Prescription filled 9/19 for 90 pills. Patient has 30 tablets remaining in bottle. Xtampza 27mg  twice daily. Education provided with plans to increase dose to 36 mg every 12 hours. He is no longer taking gabapentin due to feelings of jerking and twitching motion.  Education provided on the use of Cymbalta in addition to his other medications.  He verbalized understanding.  Will provide patient with a printed handout of all changes in medication chart as requested by his daughter to assist with medication management at home.    We will continue to closely monitor. Written instructions provided on how to take patient medication and schedule for clarity and compliance at home.    All questions answered and support provided.    Constipation Controlled with home regimen.  Is taking senna S2 tablets at bedtime.   Decreased appetite/fatigue Slowly improving.  Some days are better than others.  States his appetite fluctuates depending on his pain level and around his treatment days.  Unable to update weight on today as patient states he did not feel strong enough to stand on scale out of fear of falling.   Goals of Care  11/07/22- We discussed his current illness and what it means in the larger context of his on-going co-morbidities. Natural disease trajectory and expectations were discussed.   Patient's son verbalized understanding of his father's current illness. Patient also able to acknowledge some form of  understanding but with some obvious signs of confusion when it comes to memory recall. He mentions his children assist with most of his medical needs.    I empathetically approached discussions regarding healthcare limitations including code status and advanced directives. Son, Terrance states they are in the process of completing documents. Mr. Fluck (patient) confirms. They verbalize patient's son Jordin Betancourt is primary medical decision maker. They request patient to remain full code with full scope care. Mr. Rodeheaver and son are clear in expressed wishes to continue to treat the treatable allowing patient every opportunity to continue to do as good as he can for as long as he can while managing his symptoms.     We discussed Her current illness and what it means in the larger context of Her on-going co-morbidities. Natural disease trajectory and expectations were discussed.  I discussed the importance of continued conversation with family and their medical providers regarding overall plan of care and treatment options, ensuring decisions are within the context of the patients values and GOCs.  PLAN:  Xtampza 36mg  twice daily.  Oxycodone 10mg  every 6 hours as needed for breakthrough  pain Hydromorphone 1mg  IV x2 while in infusion Cymbalta 30 mg daily. Senna-S 2 tablets at bedtime Miralax daily Ongoing symptom management needs and goals of care discussions. Written medication schedule and pill organizer provided to patient for home support.  Patient and family in process of completing advanced directives. I will plan to see patient back in 1-2 weeks in collaboration to other oncology appointments. Will continue to closely support.    Patient expressed understanding and was in agreement with this plan. He also understands that He can call the clinic at any time with any questions, concerns, or complaints.   Any controlled substances utilized were prescribed in the context of palliative care. PDMP  has been reviewed.    Visit consisted of counseling and education dealing with the complex and emotionally intense issues of symptom management and palliative care in the setting of serious and potentially life-threatening illness.  Willette Alma, AGPCNP-BC  Palliative Medicine Team/[facility]  *Please note that this is a verbal dictation therefore any spelling or grammatical errors are due to the "Dragon Medical One" system interpretation.

## 2022-11-19 DIAGNOSIS — C20 Malignant neoplasm of rectum: Secondary | ICD-10-CM | POA: Diagnosis not present

## 2022-11-19 DIAGNOSIS — M1990 Unspecified osteoarthritis, unspecified site: Secondary | ICD-10-CM | POA: Diagnosis not present

## 2022-11-19 DIAGNOSIS — G893 Neoplasm related pain (acute) (chronic): Secondary | ICD-10-CM | POA: Diagnosis not present

## 2022-11-19 DIAGNOSIS — D630 Anemia in neoplastic disease: Secondary | ICD-10-CM | POA: Diagnosis not present

## 2022-11-19 DIAGNOSIS — C7951 Secondary malignant neoplasm of bone: Secondary | ICD-10-CM | POA: Diagnosis not present

## 2022-11-19 DIAGNOSIS — E785 Hyperlipidemia, unspecified: Secondary | ICD-10-CM | POA: Diagnosis not present

## 2022-11-19 DIAGNOSIS — I10 Essential (primary) hypertension: Secondary | ICD-10-CM | POA: Diagnosis not present

## 2022-11-19 DIAGNOSIS — D500 Iron deficiency anemia secondary to blood loss (chronic): Secondary | ICD-10-CM | POA: Diagnosis not present

## 2022-11-19 DIAGNOSIS — E119 Type 2 diabetes mellitus without complications: Secondary | ICD-10-CM | POA: Diagnosis not present

## 2022-11-20 MED FILL — Dexamethasone Sodium Phosphate Inj 100 MG/10ML: INTRAMUSCULAR | Qty: 1 | Status: AC

## 2022-11-21 ENCOUNTER — Inpatient Hospital Stay: Payer: Medicare HMO

## 2022-11-21 ENCOUNTER — Encounter: Payer: Self-pay | Admitting: Hematology and Oncology

## 2022-11-21 ENCOUNTER — Other Ambulatory Visit (HOSPITAL_COMMUNITY): Payer: Self-pay

## 2022-11-21 ENCOUNTER — Telehealth: Payer: Self-pay | Admitting: Radiation Oncology

## 2022-11-21 ENCOUNTER — Inpatient Hospital Stay (HOSPITAL_BASED_OUTPATIENT_CLINIC_OR_DEPARTMENT_OTHER): Payer: Medicare HMO | Admitting: Nurse Practitioner

## 2022-11-21 ENCOUNTER — Other Ambulatory Visit: Payer: Self-pay

## 2022-11-21 ENCOUNTER — Encounter: Payer: Self-pay | Admitting: Nurse Practitioner

## 2022-11-21 ENCOUNTER — Inpatient Hospital Stay: Payer: Medicare HMO | Attending: Radiation Oncology

## 2022-11-21 ENCOUNTER — Ambulatory Visit (HOSPITAL_COMMUNITY)
Admission: RE | Admit: 2022-11-21 | Discharge: 2022-11-21 | Disposition: A | Discharge status: Home / Self Care | Payer: Medicare HMO | Source: Ambulatory Visit | Attending: Nurse Practitioner | Admitting: Nurse Practitioner

## 2022-11-21 VITALS — BP 131/86 | HR 101 | Temp 98.3°F | Resp 19

## 2022-11-21 DIAGNOSIS — Z515 Encounter for palliative care: Secondary | ICD-10-CM | POA: Diagnosis not present

## 2022-11-21 DIAGNOSIS — G893 Neoplasm related pain (acute) (chronic): Secondary | ICD-10-CM | POA: Insufficient documentation

## 2022-11-21 DIAGNOSIS — C20 Malignant neoplasm of rectum: Secondary | ICD-10-CM | POA: Insufficient documentation

## 2022-11-21 DIAGNOSIS — R530 Neoplastic (malignant) related fatigue: Secondary | ICD-10-CM

## 2022-11-21 DIAGNOSIS — C7951 Secondary malignant neoplasm of bone: Secondary | ICD-10-CM | POA: Diagnosis not present

## 2022-11-21 DIAGNOSIS — Z79899 Other long term (current) drug therapy: Secondary | ICD-10-CM | POA: Insufficient documentation

## 2022-11-21 DIAGNOSIS — K5903 Drug induced constipation: Secondary | ICD-10-CM

## 2022-11-21 DIAGNOSIS — Z96642 Presence of left artificial hip joint: Secondary | ICD-10-CM | POA: Diagnosis not present

## 2022-11-21 DIAGNOSIS — D509 Iron deficiency anemia, unspecified: Secondary | ICD-10-CM | POA: Insufficient documentation

## 2022-11-21 DIAGNOSIS — E785 Hyperlipidemia, unspecified: Secondary | ICD-10-CM | POA: Diagnosis not present

## 2022-11-21 DIAGNOSIS — Z5111 Encounter for antineoplastic chemotherapy: Secondary | ICD-10-CM | POA: Insufficient documentation

## 2022-11-21 DIAGNOSIS — I10 Essential (primary) hypertension: Secondary | ICD-10-CM | POA: Insufficient documentation

## 2022-11-21 DIAGNOSIS — Z5112 Encounter for antineoplastic immunotherapy: Secondary | ICD-10-CM | POA: Diagnosis not present

## 2022-11-21 DIAGNOSIS — E119 Type 2 diabetes mellitus without complications: Secondary | ICD-10-CM | POA: Insufficient documentation

## 2022-11-21 DIAGNOSIS — M1711 Unilateral primary osteoarthritis, right knee: Secondary | ICD-10-CM | POA: Diagnosis not present

## 2022-11-21 LAB — MAGNESIUM: Magnesium: 1.5 mg/dL — ABNORMAL LOW (ref 1.7–2.4)

## 2022-11-21 LAB — CBC WITH DIFFERENTIAL (CANCER CENTER ONLY)
Abs Immature Granulocytes: 0.01 10*3/uL (ref 0.00–0.07)
Basophils Absolute: 0 10*3/uL (ref 0.0–0.1)
Basophils Relative: 0 %
Eosinophils Absolute: 0.2 10*3/uL (ref 0.0–0.5)
Eosinophils Relative: 5 %
HCT: 30.1 % — ABNORMAL LOW (ref 39.0–52.0)
Hemoglobin: 9.3 g/dL — ABNORMAL LOW (ref 13.0–17.0)
Immature Granulocytes: 0 %
Lymphocytes Relative: 9 %
Lymphs Abs: 0.3 10*3/uL — ABNORMAL LOW (ref 0.7–4.0)
MCH: 27.6 pg (ref 26.0–34.0)
MCHC: 30.9 g/dL (ref 30.0–36.0)
MCV: 89.3 fL (ref 80.0–100.0)
Monocytes Absolute: 0.3 10*3/uL (ref 0.1–1.0)
Monocytes Relative: 8 %
Neutro Abs: 3.1 10*3/uL (ref 1.7–7.7)
Neutrophils Relative %: 78 %
Platelet Count: 286 10*3/uL (ref 150–400)
RBC: 3.37 MIL/uL — ABNORMAL LOW (ref 4.22–5.81)
RDW: 17.8 % — ABNORMAL HIGH (ref 11.5–15.5)
WBC Count: 4 10*3/uL (ref 4.0–10.5)
nRBC: 0 % (ref 0.0–0.2)

## 2022-11-21 LAB — CMP (CANCER CENTER ONLY)
ALT: 7 U/L (ref 0–44)
AST: 24 U/L (ref 15–41)
Albumin: 3.5 g/dL (ref 3.5–5.0)
Alkaline Phosphatase: 422 U/L — ABNORMAL HIGH (ref 38–126)
Anion gap: 9 (ref 5–15)
BUN: 8 mg/dL (ref 8–23)
CO2: 26 mmol/L (ref 22–32)
Calcium: 8.9 mg/dL (ref 8.9–10.3)
Chloride: 102 mmol/L (ref 98–111)
Creatinine: 0.51 mg/dL — ABNORMAL LOW (ref 0.61–1.24)
GFR, Estimated: >60 mL/min (ref >60)
Glucose, Bld: 150 mg/dL — ABNORMAL HIGH (ref 70–99)
Potassium: 3.4 mmol/L — ABNORMAL LOW (ref 3.5–5.1)
Sodium: 137 mmol/L (ref 135–145)
Total Bilirubin: 0.9 mg/dL (ref 0.3–1.2)
Total Protein: 6.7 g/dL (ref 6.5–8.1)

## 2022-11-21 MED ORDER — FLUOROURACIL CHEMO INJECTION 2.5 GM/50ML
400.0000 mg/m2 | Freq: Once | INTRAVENOUS | Status: AC
  Administered 2022-11-21: 850 mg via INTRAVENOUS
  Filled 2022-11-21: qty 17

## 2022-11-21 MED ORDER — SODIUM CHLORIDE 0.9 % IV SOLN
10.0000 mg | Freq: Once | INTRAVENOUS | Status: AC
  Administered 2022-11-21: 10 mg via INTRAVENOUS
  Filled 2022-11-21: qty 10

## 2022-11-21 MED ORDER — SODIUM CHLORIDE 0.9 % IV SOLN
4.0000 mg/kg | Freq: Once | INTRAVENOUS | Status: AC
  Administered 2022-11-21: 360 mg via INTRAVENOUS
  Filled 2022-11-21: qty 18

## 2022-11-21 MED ORDER — XTAMPZA ER 36 MG PO C12A
36.0000 mg | EXTENDED_RELEASE_CAPSULE | Freq: Two times a day (BID) | ORAL | 0 refills | Status: DC
  Filled 2022-11-21: qty 60, 30d supply, fill #0

## 2022-11-21 MED ORDER — POTASSIUM CHLORIDE CRYS ER 10 MEQ PO TBCR: 10.0000 meq | EXTENDED_RELEASE_TABLET | Freq: Every day | ORAL | 0 refills | Status: DC

## 2022-11-21 MED ORDER — MAGNESIUM OXIDE -MG SUPPLEMENT 400 (240 MG) MG PO TABS: 400.0000 mg | ORAL_TABLET | Freq: Every day | ORAL | 0 refills | Status: DC

## 2022-11-21 MED ORDER — DULOXETINE HCL 30 MG PO CPEP
30.0000 mg | ORAL_CAPSULE | Freq: Every day | ORAL | 0 refills | Status: DC
Start: 2022-11-21 — End: 2023-01-30
  Filled 2022-11-21: qty 30, 30d supply, fill #0

## 2022-11-21 MED ORDER — SODIUM CHLORIDE 0.9% FLUSH: 10.0000 mL | INTRAVENOUS | Status: DC | PRN

## 2022-11-21 MED ORDER — SODIUM CHLORIDE 0.9 % IV SOLN
180.0000 mg/m2 | Freq: Once | INTRAVENOUS | Status: AC
  Administered 2022-11-21: 400 mg via INTRAVENOUS
  Filled 2022-11-21: qty 15

## 2022-11-21 MED ORDER — HYDROMORPHONE HCL 1 MG/ML IJ SOLN
1.0000 mg | INTRAMUSCULAR | Status: DC | PRN
  Administered 2022-11-21: 1 mg via INTRAVENOUS
  Filled 2022-11-21 (×2): qty 1

## 2022-11-21 MED ORDER — SODIUM CHLORIDE 0.9 % IV SOLN: Freq: Once | INTRAVENOUS | Status: AC

## 2022-11-21 MED ORDER — SODIUM CHLORIDE 0.9 % IV SOLN
2400.0000 mg/m2 | INTRAVENOUS | Status: DC
  Administered 2022-11-21: 5000 mg via INTRAVENOUS
  Filled 2022-11-21: qty 100

## 2022-11-21 MED ORDER — SODIUM CHLORIDE 0.9 % IV SOLN
400.0000 mg/m2 | Freq: Once | INTRAVENOUS | Status: AC
  Administered 2022-11-21: 848 mg via INTRAVENOUS
  Filled 2022-11-21: qty 42.4

## 2022-11-21 MED ORDER — SODIUM CHLORIDE 0.9 % IV SOLN
4.0000 mg/kg | Freq: Once | INTRAVENOUS | Status: DC
Start: 2022-11-21 — End: 2022-11-21

## 2022-11-21 MED ORDER — PALONOSETRON HCL INJECTION 0.25 MG/5ML
0.2500 mg | Freq: Once | INTRAVENOUS | Status: AC
  Administered 2022-11-21: 0.25 mg via INTRAVENOUS
  Filled 2022-11-21: qty 5

## 2022-11-21 MED ORDER — HEPARIN SOD (PORK) LOCK FLUSH 100 UNIT/ML IV SOLN: 500.0000 [IU] | Freq: Once | INTRAVENOUS | Status: DC | PRN

## 2022-11-21 NOTE — Telephone Encounter (Signed)
10/10 @ 3:40 pm Left voicemail for patient to call our office to be schedule for consult.

## 2022-11-21 NOTE — Progress Notes (Signed)
OK to infuse Vectibix over per Santiago Glad, NP.  Ebony Hail, Pharm.D., CPP 11/21/2022@10 :11 AM

## 2022-11-21 NOTE — Patient Instructions (Signed)
-   pick up and start to take cymbalta for your pain. Take 1 pill every night before bedtime - pick up your refill of your xtampza (time-release oxycodone) you will take ONE pill every 12 hours as the pills have increased in strength - continue to take your oxycodone (immediate release) 1-2 pills every 4 hours. Take when your pain reaches a 4-5 out of 10 and do not wait until your pain is excruciating.  - we will call you next Thursday 11/28/22 to check in and see how the Cymbalta is doing - cal the office at [phone] with any questions or concerns

## 2022-11-21 NOTE — Patient Instructions (Signed)
Instructions from palliative and Lowella Bandy - pick up and start to take cymbalta for your pain. Take 1 pill every night before bedtime - pick up your refill of your xtampza (time-release oxycodone) you will take ONE pill every 12 hours as the pills have increased in strength - continue to take your oxycodone (immediate release) 1-2 pills every 4 hours. Take when your pain reaches a 4-5 out of 10 and do not wait until your pain is excruciating.  - we will call you next Thursday 11/28/22 to check in and see how the Cymbalta is doing - cal the office at [phone] with any questions or concerns

## 2022-11-21 NOTE — Progress Notes (Signed)
Patient Care Team: [name], MD as PCP - General (Hematology and Oncology) Doreatha Massed, MD as Medical Oncologist (Medical Oncology) Pickenpack-Cousar, Arty Baumgartner, NP as Nurse Practitioner (Hospice and Palliative Medicine)   CHIEF COMPLAINT: Metastatic rectal cancer  Oncology History  Rectal carcinoma (HCC)  03/19/2022 Initial Diagnosis   Rectal carcinoma (HCC)   03/19/2022 Cancer Staging   Staging form: Colon and Rectum, AJCC 8th Edition - Clinical stage from 03/19/2022: Stage IVB (cTX, cN2b, cM1b) - Signed by [name], MD on 05/21/2022 Stage prefix: Initial diagnosis   04/08/2022 - 07/04/2022 Chemotherapy   Patient is on Treatment Plan : COLORECTAL FOLFOX + Bevacizumab q14d     07/31/2022 -  Chemotherapy   Patient is on Treatment Plan : COLORECTAL FOLFIRI + Panitumumab q14d        CURRENT THERAPY: FOLFIRI/panitumumab q14 days starting 07/30/2022, dose reduced for rash  INTERVAL HISTORY Mr. Strege returns for follow-up and treatment as scheduled, last seen by my colleague Georga Kaufmann, PA 9/26 with cycle 6. He tolerated treatment well overall. Rash is minimal. Manages constipation. Reports taking oxycontin BID consistently and oxy IR PRN. He did not need any for 2 days but woke up this morning in a pain crisis and took 2. Denies fall or obvious trauma. Says this is not a new pain.   Denies n/v, bleeding, fever, chills, cough, dyspnea.   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   with abnormal R wave progression noted on EKG   MRSA (methicillin resistant Staphylococcus aureus)    Pre-diabetes    Rectal carcinoma (HCC) 03/19/2022     Past Surgical History:  Procedure Laterality Date   COLONOSCOPY     HUMERUS IM NAIL Right 04/12/2022   Procedure: INTRAMEDULLARY (IM) NAIL HUMERAL;  Surgeon: Bjorn Pippin, MD;   Location: [facility];  Service: Orthopedics;  Laterality: Right;   IR IMAGING GUIDED PORT INSERTION  04/05/2022   TOTAL HIP ARTHROPLASTY Left 02/06/2022   Procedure: TOTAL HIP ARTHROPLASTY;  Surgeon: Joen Laura, MD;  Location: [facility];  Service: Orthopedics;  Laterality: Left;     Outpatient Encounter Medications as of 11/21/2022  Medication Sig   cyanocobalamin (VITAMIN B12) 1000 MCG tablet Take 1 tablet (1,000 mcg total) by mouth daily.   doxycycline (VIBRA-TABS) 100 MG tablet Take 1 tablet (100 mg total) by mouth 2 (two) times daily.   magnesium oxide (MAG-OX) 400 (240 Mg) MG tablet Take 1 tablet (400 mg total) by mouth daily.   pantoprazole (PROTONIX) 20 MG tablet Take 1 tablet (20 mg total) by mouth daily.   polyethylene glycol (MIRALAX / GLYCOLAX) 17 g packet Take 17 g by mouth daily as needed for mild constipation.   potassium chloride (KLOR-CON M) 10 MEQ tablet Take 1 tablet (10 mEq total) by mouth daily.   senna-docusate (SENOKOT-S) 8.6-50 MG tablet Take 1 tablet by mouth 2 (two) times daily.   clindamycin (CLEOCIN-T) 1 % lotion Apply to rash as needed. (Patient not taking: Reported on 11/21/2022)   gabapentin (NEURONTIN) 300 MG capsule Take 1 capsule (300 mg total) by mouth at bedtime. (Patient not taking: Reported on 07/31/2022)   lidocaine-prilocaine (EMLA) cream Apply a quarter-sized amount to port a cath site and cover with plastic wrap one hour prior to infusion appointments (Patient not taking: Reported  on 09/11/2022)   ondansetron (ZOFRAN) 8 MG tablet Take 2 tablets (16 mg total) by mouth every 8 (eight) hours as needed for nausea or vomiting. (Patient not taking: Reported on 08/14/2022)   No facility-administered encounter medications on file as of 11/21/2022.     Today's Vitals   11/21/22 0800 11/21/22 0814  BP:  131/86  Pulse:  (!) 101  Resp:  19  Temp:  98.3 F (36.8 C)  TempSrc:  Oral  SpO2:  100%  PainSc: 10-Worst pain ever    There is no height or weight on  file to calculate BMI.   PHYSICAL EXAM GENERAL:alert, no distress and uncomfortable SKIN: mild dry skin/acne rash to face EYES: sclera clear LUNGS: clear with normal breathing effort HEART: regular rate & rhythm, bilateral lower extremity edema NEURO: alert & oriented x 3 with fluent speech, no focal motor/sensory deficits PAC without erythema    CBC    Component Value Date/Time   WBC 4.0 11/21/2022 0743   WBC 8.2 11/05/2022 1400   RBC 3.37 (L) 11/21/2022 0743   HGB 9.3 (L) 11/21/2022 0743   HCT 30.1 (L) 11/21/2022 0743   PLT 286 11/21/2022 0743   MCV 89.3 11/21/2022 0743   MCH 27.6 11/21/2022 0743   MCHC 30.9 11/21/2022 0743   RDW 17.8 (H) 11/21/2022 0743   LYMPHSABS 0.3 (L) 11/21/2022 0743   MONOABS 0.3 11/21/2022 0743   EOSABS 0.2 11/21/2022 0743   BASOSABS 0.0 11/21/2022 0743     CMP     Component Value Date/Time   NA 137 11/21/2022 0743   K 3.4 (L) 11/21/2022 0743   CL 102 11/21/2022 0743   CO2 26 11/21/2022 0743   GLUCOSE 150 (H) 11/21/2022 0743   BUN 8 11/21/2022 0743   CREATININE 0.51 (L) 11/21/2022 0743   CREATININE 0.77 06/04/2016 1259   CALCIUM 8.9 11/21/2022 0743   PROT 6.7 11/21/2022 0743   ALBUMIN 3.5 11/21/2022 0743   AST 24 11/21/2022 0743   ALT 7 11/21/2022 0743   ALKPHOS 422 (H) 11/21/2022 0743   BILITOT 0.9 11/21/2022 0743   GFRNONAA >60 11/21/2022 0743   GFRNONAA >89 04/08/2015 0837   GFRAA >60 10/13/2019 1333   GFRAA >89 04/08/2015 0837     ASSESSMENT & PLAN: 62 yo male with  # Metastatic Rectal Adenocarcinoma 02/03/2022-02/11/2022: Presented to ED due to left hip pain after a mechanical fall.  02/04/2022: CT left MVH:QIONG pathologic fracture of the left femoral neck with displacement and angulation, with underlying lytic lesions in the femoral head and neck.Additional lytic destructive lesion of the left parasymphyseal pubic bone extending throughout the superior pubic ramus to the puboacetabular junction, with pathologic fracture of the  superior pubic ramus. Adjacent hypoattenuation in the pelvic component of the obturator internus muscle, suspicious for tumor involvement. Probable small lytic lesion in the inferior pubic ramus.Prominent left external iliac lymph nodes measuring up to 1.2 cm. 02/05/2022: CT CAP: Pathologic retroperitoneal and bilateral pelvic adenopathy, with lytic destructive lesions of the left superior and inferior pubic ramus and pubic body as well as a pathologic fracture through the left femoral neck. Pathologic fracture anteriorly in the right second rib. Deformity anteriorly in the right third rib probably from early pathologic fracture. Metastatic disease or myeloma strongly favored over multifocal osteomyelitis as a cause. Mildly enlarged left supraclavicular lymph node and lower thoracic periaortic lymph node. Pathologic retroperitoneal and pelvic adenopathy. 10 cm long segment of rectal wall thickening.1.1 cm exophytic lesion from the left mid kidney  posterolaterally, nonspecific for complex cyst versus tumor. Bandlike atelectasis in both lungs.Small type 1 hiatal hernia.Lumbar spondylosis and degenerative disc disease causing generally mild multilevel impingement. Sigmoid colon diverticulosis.Wall thickening in the proximal stomach body, probably secondary to nondistention. 02/06/2022: Underwent left total hip arthroplasty. Pathology revealed poorly differentiated carcinoma.  SPEP/IFE did not detect monoclonal protein. PSA normal. 02/25/2022: Underwent colonoscopy that showed malignant tumor in the rectum. Pathology confirmed poorly differentiated carcinoma.  04/05/2022: Underwent port placement 04/08/2022: Cycle 1, Day 1 of FOLFOX (held bevacizumab due to recent port placement) 04/23/2022: Cycle 2, Day 1 of FOLFOX plus bevacizumab 05/07/2022: Cycle 3, Day 1 of FOLFOX plus bevacizumab 05/21/2022: Cycle 4, Day 1 of FOLFOX plus bevacizumab. Transitioned care to Dr. Caren Hazy at [facility] 05/14/22 - 06/03/22 palliative  radiation per Dr. Mitzi Hansen to R humerus, lumbar spine, and left pelvis  07/02/2022: Cycle 6, Day 1 of FOLFOX plus bevacizumab at [facility].  07/11/2022: NM PET CT scan showed progression of hypermetabolic osseous metastatic disease, although there is some mixed change as detailed. New hypermetabolic right femoral neck metastasis places the patient at risk for pathologic right femoral neck fracture. 07/30/2022:  Cycle 1 Day 1  FOLFIRI +Panitumumab 08/14/2022: Cycle 2 Day 1 FOLFIRI + Panitumumab 08/27/2022: Cycle 3 Day 1 FOLFIRI + Panitumumab 09/11/2022: Cycle 4 Day 1 FOLFIRI (Hold Panitumumab due to acneiform rash) 10/02/2022: Cycle 5 Day 1 FOLFIRI (Hold Panitumumab due to acneiform rash) 11/07/2022: Cycle 6 Day 1 FOLFIRI plus Panitumumab dose reduced at 4 mg/kg.  11/21/2022: cycle 7 day 1 FOLFIRI plus dose reduced panitumumab 4 mg/kg. Start oral K/Mg   Disposition:  Mr. Daughety presents in acute R hip pain crisis but stable. Will receive IV dilaudid and see palliative care. Not able to bear weight, will repeat xray to r/o fracture. He agrees to radiation referral to consider palliative RT, I sent a message to Dr. Mitzi Hansen.  From a treatment standpoint, s/p C6 FOLFIRI/vectibix, tolerating well with stable rash and constipation. SEs are adequately managed with supportive care at home. Labs reviewed, will start oral K and Mg once daily for mild electrolyte abnormalities.   Proceed with C7 FOLFIRI and dose-reduced vectibix today. Dr. Leonides Schanz will see patient in infusion room.   F/up and C8 in 2 weeks as scheduled.  Orders Placed This Encounter  Procedures   DG Hip Unilat W or W/O Pelvis 1 View Right    Standing Status:   Future    Standing Expiration Date:   11/21/2023    Order Specific Question:   Reason for Exam (SYMPTOM  OR DIAGNOSIS REQUIRED)    Answer:   rectal cancer with bone mets, R ischium, R hip pain non weight bearing. r/o fracture    Order Specific Question:   Preferred imaging location?     Answer:   [facility]   DG FEMUR 1V RIGHT    Standing Status:   Future    Standing Expiration Date:   11/21/2023    Order Specific Question:   Reason for Exam (SYMPTOM  OR DIAGNOSIS REQUIRED)    Answer:   rectal cancer with bone mets, R ischium, R hip pain non weight bearing. r/o fracture    Order Specific Question:   Preferred imaging location?    Answer:   [facility]      All questions were answered. The patient knows to call the clinic with any problems, questions or concerns. No barriers to learning were detected.   Santiago Glad, NP-C 11/21/2022

## 2022-11-22 ENCOUNTER — Telehealth: Payer: Self-pay | Admitting: Radiation Oncology

## 2022-11-22 NOTE — Telephone Encounter (Signed)
10/11 @ 9:06 am Left voicemail for patient to call our office to be schedule for consult with ct sim/treatment planning.

## 2022-11-23 ENCOUNTER — Inpatient Hospital Stay: Payer: Medicare HMO

## 2022-11-23 VITALS — BP 128/78 | HR 86 | Temp 98.4°F | Resp 17

## 2022-11-23 DIAGNOSIS — C7951 Secondary malignant neoplasm of bone: Secondary | ICD-10-CM | POA: Diagnosis not present

## 2022-11-23 DIAGNOSIS — I10 Essential (primary) hypertension: Secondary | ICD-10-CM | POA: Diagnosis not present

## 2022-11-23 DIAGNOSIS — Z79899 Other long term (current) drug therapy: Secondary | ICD-10-CM | POA: Diagnosis not present

## 2022-11-23 DIAGNOSIS — C20 Malignant neoplasm of rectum: Secondary | ICD-10-CM

## 2022-11-23 DIAGNOSIS — E119 Type 2 diabetes mellitus without complications: Secondary | ICD-10-CM | POA: Diagnosis not present

## 2022-11-23 DIAGNOSIS — D509 Iron deficiency anemia, unspecified: Secondary | ICD-10-CM | POA: Diagnosis not present

## 2022-11-23 DIAGNOSIS — Z5111 Encounter for antineoplastic chemotherapy: Secondary | ICD-10-CM | POA: Diagnosis not present

## 2022-11-23 DIAGNOSIS — Z5112 Encounter for antineoplastic immunotherapy: Secondary | ICD-10-CM | POA: Diagnosis not present

## 2022-11-23 DIAGNOSIS — E785 Hyperlipidemia, unspecified: Secondary | ICD-10-CM | POA: Diagnosis not present

## 2022-11-23 MED ORDER — HEPARIN SOD (PORK) LOCK FLUSH 100 UNIT/ML IV SOLN
500.0000 [IU] | Freq: Once | INTRAVENOUS | Status: AC | PRN
  Administered 2022-11-23: 500 [IU]

## 2022-11-23 MED ORDER — SODIUM CHLORIDE 0.9% FLUSH
10.0000 mL | INTRAVENOUS | Status: DC | PRN
  Administered 2022-11-23: 10 mL

## 2022-11-25 ENCOUNTER — Encounter: Payer: Self-pay | Admitting: Hematology and Oncology

## 2022-11-25 ENCOUNTER — Encounter: Payer: Self-pay | Admitting: Nurse Practitioner

## 2022-11-25 ENCOUNTER — Telehealth: Payer: Self-pay | Admitting: Radiation Oncology

## 2022-11-25 DIAGNOSIS — E119 Type 2 diabetes mellitus without complications: Secondary | ICD-10-CM | POA: Diagnosis not present

## 2022-11-25 DIAGNOSIS — M1990 Unspecified osteoarthritis, unspecified site: Secondary | ICD-10-CM | POA: Diagnosis not present

## 2022-11-25 DIAGNOSIS — I10 Essential (primary) hypertension: Secondary | ICD-10-CM | POA: Diagnosis not present

## 2022-11-25 DIAGNOSIS — G893 Neoplasm related pain (acute) (chronic): Secondary | ICD-10-CM | POA: Diagnosis not present

## 2022-11-25 DIAGNOSIS — C7951 Secondary malignant neoplasm of bone: Secondary | ICD-10-CM | POA: Diagnosis not present

## 2022-11-25 DIAGNOSIS — D630 Anemia in neoplastic disease: Secondary | ICD-10-CM | POA: Diagnosis not present

## 2022-11-25 DIAGNOSIS — E785 Hyperlipidemia, unspecified: Secondary | ICD-10-CM | POA: Diagnosis not present

## 2022-11-25 DIAGNOSIS — C20 Malignant neoplasm of rectum: Secondary | ICD-10-CM | POA: Diagnosis not present

## 2022-11-25 DIAGNOSIS — D500 Iron deficiency anemia secondary to blood loss (chronic): Secondary | ICD-10-CM | POA: Diagnosis not present

## 2022-11-25 NOTE — Telephone Encounter (Signed)
10/14 @ 2:00 pm sent unable to contact letter to patient's residence.

## 2022-11-25 NOTE — Telephone Encounter (Addendum)
10/14 @ 9:17 am Left voicemail on patient and patient's son contact numbers for patient to call our office to be schedule for consult with ct sim/treatment planning.

## 2022-11-25 NOTE — Progress Notes (Signed)
Palliative Medicine [facility]  [phone] [phone]   Name: Joseph Hogan Date: 11/25/2022 MRN: [id number]  DOB: Mar 18, 1960  Patient Care Team: [name], MD as PCP - General (Hematology and Oncology) Doreatha Massed, MD as Medical Oncologist (Medical Oncology) Pickenpack-Cousar, Arty Baumgartner, NP as Nurse Practitioner [facility] and Palliative Medicine)   I connected with Joseph Hogan on 11/25/22 at  9:30 AM EDT by phone and verified that I am speaking with the correct person using two identifiers.   I discussed the limitations, risks, security and privacy concerns of performing an evaluation and management service by telemedicine and the availability of in-person appointments. I also discussed with the patient that there may be a patient responsible charge related to this service. The patient expressed understanding and agreed to proceed.   Other persons participating in the visit and their role in the encounter: n/a   Patient's location: home  Provider's location: [facility]   Chief Complaint: f/u of symptom management    INTERVAL HISTORY: Joseph Hogan is a 62 y.o. 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 Palliative ask to see for symptom management and goals of care.   SOCIAL HISTORY:     reports that he has never smoked. He has never used smokeless tobacco. He reports that he does not drink alcohol and does not use drugs.  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  MEDICATIONS:  Current Outpatient Medications  Medication Sig Dispense Refill   clindamycin (CLEOCIN-T) 1 % lotion Apply to rash as needed. (Patient not taking: Reported on 11/21/2022) 60 mL 0   cyanocobalamin (VITAMIN B12) 1000 MCG tablet Take 1 tablet (1,000 mcg total) by mouth daily. 30 tablet 1   doxycycline (VIBRA-TABS) 100 MG tablet Take 1 tablet (100 mg total) by mouth 2 (two) times daily. 60 tablet 3   DULoxetine (CYMBALTA) 30 MG capsule Take 1 capsule (30 mg total) by mouth daily. 30 capsule 0   gabapentin (NEURONTIN) 300 MG capsule Take 1 capsule (300 mg total) by mouth at bedtime. (Patient not taking: Reported on 07/31/2022) 30 capsule 1   lidocaine-prilocaine (EMLA) cream Apply a quarter-sized amount to port a cath site and cover with plastic wrap one hour prior to infusion appointments (Patient not taking: Reported on 09/11/2022) 30 g 3   magnesium oxide (MAG-OX) 400 (240 Mg) MG tablet Take 1 tablet (400 mg total) by mouth daily. 30 tablet 0   ondansetron (ZOFRAN) 8 MG tablet Take 2 tablets (16 mg total) by mouth every 8 (eight) hours as needed for nausea or vomiting. (Patient not taking: Reported on 08/14/2022) 20 tablet 3   oxyCODONE ER (XTAMPZA ER) 36 MG C12A Take 1 capsule (36 mg total) by mouth every 12 (twelve) hours. 60 capsule 0   pantoprazole (PROTONIX) 20 MG tablet Take 1 tablet (20 mg total) by mouth daily. 30 tablet 0   polyethylene glycol (MIRALAX /  GLYCOLAX) 17 g packet Take 17 g by mouth daily as needed for mild constipation. 14 each 0   potassium chloride (KLOR-CON M) 10 MEQ tablet Take 1 tablet (10 mEq total) by mouth daily. 30 tablet 0   senna-docusate (SENOKOT-S) 8.6-50 MG tablet Take 1 tablet by mouth 2 (two) times daily. 30 tablet 0   No current facility-administered medications for this visit.    VITAL SIGNS: There were no vitals taken for this visit. There were no  vitals filed for this visit.  Estimated body mass index is 29.55 kg/m as calculated from the following:   Height as of 11/07/22: 5\' 11"  (1.803 m).   Weight as of 11/05/22: 211 lb 13.8 oz (96.1 kg).   PERFORMANCE STATUS (ECOG) : 2 - Symptomatic, <50% confined to bed    IMPRESSION: I connected by phone with Joseph Hogan. He reports he is doing well overall. Feeling much better compared to previous days. Denies nausea, vomiting, constipation, or diarrhea.  Is trying to slowly increase activity while evaluating pain level.  Appetite is good.  Neoplasm related pain Joseph Hogan reports his pain is much better controlled on current regimen.  He is able to ambulate around the home and perform some ADLs more comfortably.  He is much appreciative of this.  Following regimen as prescribed which has made a difference.  We reviewed regimen at length.  Xtampza 36 mg twice daily.  Tolerating increased dosage without difficulty.  Noticeable improvement in his pain.  He is no longer taking gabapentin due to feelings of jerking and twitching motion.  Patient is self discontinued use of Cymbalta.  States he did not like how the prescription made him feel psychologically.  Does not wish to restart.  States he tried medication for 3 days.  We will continue to closely monitor.  No changes to current regimen at this time. All questions answered and support provided.    Constipation Controlled with home regimen.  Is taking senna S2 tablets at bedtime.   Decreased appetite/fatigue Slowly improving.   Goals of Care  11/07/22- We discussed his current illness and what it means in the larger context of his on-going co-morbidities. Natural disease trajectory and expectations were discussed.   Patient's son verbalized understanding of his father's current illness. Patient also able to acknowledge some form of understanding but with some obvious signs of confusion when it comes to memory recall. He mentions his children  assist with most of his medical needs.    I empathetically approached discussions regarding healthcare limitations including code status and advanced directives. Son, Joseph Hogan states they are in the process of completing documents. Joseph Hogan (patient) confirms. They verbalize patient's [name] is primary medical decision maker. They request patient to remain full code with full scope care. Joseph Hogan and son are clear in expressed wishes to continue to treat the treatable allowing patient every opportunity to continue to do as good as he can for as long as he can while managing his symptoms.     We discussed Her current illness and what it means in the larger context of Her on-going co-morbidities. Natural disease trajectory and expectations were discussed.  I discussed the importance of continued conversation with family and their medical providers regarding overall plan of care and treatment options, ensuring decisions are within the context of the patients values and GOCs.  PLAN:  Xtampza 36mg  twice daily.  Oxycodone 10mg  every 6 hours as needed for breakthrough pain Hydromorphone 1mg  IV x2  while in infusion Cymbalta 30 mg daily. Patient discontinued  Senna-S 2 tablets at bedtime Miralax daily Ongoing symptom management needs and goals of care discussions.  I will plan to see patient back in 3-4 weeks in collaboration to other oncology appointments. Will continue to closely support.    Patient expressed understanding and was in agreement with this plan. He also understands that He can call the clinic at any time with any questions, concerns, or complaints.   Any controlled substances utilized were prescribed in the context of palliative care. PDMP has been reviewed.    Visit consisted of counseling and education dealing with the complex and emotionally intense issues of symptom management and palliative care in the setting of serious and potentially life-threatening  illness.  Willette Alma, AGPCNP-BC  Palliative Medicine Team/[facility]  *Please note that this is a verbal dictation therefore any spelling or grammatical errors are due to the "[employer]agon [employer] One" system interpretation.

## 2022-11-26 ENCOUNTER — Encounter: Payer: Self-pay | Admitting: Nurse Practitioner

## 2022-11-26 ENCOUNTER — Telehealth: Payer: Self-pay

## 2022-11-26 ENCOUNTER — Telehealth: Payer: Self-pay | Admitting: Radiation Oncology

## 2022-11-26 NOTE — Telephone Encounter (Signed)
Documents received from [facility] for further information for [employer] request. Documents completed and faxed back to company. Fax confirmation received. Copy of documents mailed to patient

## 2022-11-26 NOTE — Telephone Encounter (Signed)
10/15 @ 9:04 am received inbasket from Santiago Glad, patient's son requested 1st availability appointment with Laurence Aly.  Called and left voicemail for patient and patient's son to confirm time for tomorrow for patient to come in for his consult with ct sim/treatment planning to follow.

## 2022-11-27 ENCOUNTER — Ambulatory Visit
Admission: RE | Admit: 2022-11-27 | Discharge: 2022-11-27 | Disposition: A | Discharge status: Home / Self Care | Payer: Medicare HMO | Source: Ambulatory Visit | Attending: Radiation Oncology | Admitting: Radiation Oncology

## 2022-11-27 ENCOUNTER — Encounter: Payer: Self-pay | Admitting: Radiation Oncology

## 2022-11-27 VITALS — BP 114/77 | HR 86 | Temp 98.3°F | Resp 19 | Ht 71.0 in | Wt 209.0 lb

## 2022-11-27 DIAGNOSIS — D509 Iron deficiency anemia, unspecified: Secondary | ICD-10-CM | POA: Diagnosis not present

## 2022-11-27 DIAGNOSIS — C20 Malignant neoplasm of rectum: Secondary | ICD-10-CM

## 2022-11-27 DIAGNOSIS — Z809 Family history of malignant neoplasm, unspecified: Secondary | ICD-10-CM | POA: Insufficient documentation

## 2022-11-27 DIAGNOSIS — C7951 Secondary malignant neoplasm of bone: Secondary | ICD-10-CM | POA: Insufficient documentation

## 2022-11-27 DIAGNOSIS — Z510 Encounter for antineoplastic radiation therapy: Secondary | ICD-10-CM | POA: Insufficient documentation

## 2022-11-27 DIAGNOSIS — Z79899 Other long term (current) drug therapy: Secondary | ICD-10-CM | POA: Insufficient documentation

## 2022-11-27 DIAGNOSIS — I10 Essential (primary) hypertension: Secondary | ICD-10-CM | POA: Insufficient documentation

## 2022-11-27 DIAGNOSIS — G893 Neoplasm related pain (acute) (chronic): Secondary | ICD-10-CM | POA: Diagnosis not present

## 2022-11-27 DIAGNOSIS — Z8614 Personal history of Methicillin resistant Staphylococcus aureus infection: Secondary | ICD-10-CM | POA: Diagnosis not present

## 2022-11-27 NOTE — Progress Notes (Signed)
Radiation Oncology         [phone] ________________________________  Name: Joseph Hogan        MRN: [id number]  Date of Service: 11/27/2022 DOB: 21-Aug-1960  WG:NFAOZH, Thereasa Distance, MD  [name], MD     REFERRING PHYSICIAN: [name], MD   DIAGNOSIS: The primary encounter diagnosis was Secondary malignant neoplasm of bone (HCC). 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   HISTORY OF PRESENT ILLNESS: Joseph Hogan is a 62 y.o. male with a diagnosis of stage IV rectal adenocarcinoma originally diagnosed after presenting with hip pain andfound femoral neck fracture.  The scans were originally performed in December 2023, and he underwent total hip arthroplasty on the left on 02/06/2022, and pathology from this showed poorly differentiated carcinoma. On 02/25/2022 he underwent upper endoscopy and colonoscopy with Dr. Mosie Epstein.  EGD was negative for any abnormalities, colonoscopy however revealed a medium size polypoid and ulcerated mass in the rectum measuring 4 cm in length.  Biopsies were obtained and were consistent with poorly differentiated adenocarcinoma.  He started cycle 1 day 1 of FOLFOX on 04/08/2022. Imaging of the right shoulder due to pain on 04/08/2022 showed a destructive lesion in the proximal to mid right humeral shaft with associated acute to subacute pathologic fracture.  He did also undergo intramedullary nail placement in the humerus on the right side on 04/12/2022.  He ultimately underwent palliative radiation to the right arm at the level of the humerus, the lumbar spine and left pelvis which she completed in April 2024.  He has been followed closely while receiving systemic chemotherapy.  His regimen of FOLFOX and bevacizumab switched to FOLFIRI and panitumumab in June 2024 which he is continued.  He is seen today to discuss palliative radiation to the right hip after undergoing restaging  scans on 10/14/22 showing what appears to be concern for disease along the right pelvis as well as expansile changes in the left pelvis at the site of prior treatment.  He had films of his hip on the right on 11/21/2022 which showed no radiographic evidence of fracture but multifocal sclerotic bone lesions with his known disease as well as linear sclerosis involving the distal femoral shaft.     PREVIOUS RADIATION THERAPY:   05/14/22-06/03/22   Plan Name: Ext_R Site: Humerus, Right Technique: Isodose Plan Mode: Photon Dose Per Fraction: 2.5 Gy Prescribed Dose (Delivered / Prescribed): 37.5 Gy / 37.5 Gy Prescribed Fxs (Delivered / Prescribed): 15 / 15   Plan Name: Spine_L3 Site: Lumbar Spine Technique: 3D Mode: Photon Dose Per Fraction: 2.5 Gy Prescribed Dose (Delivered / Prescribed): 37.5 Gy / 37.5 Gy Prescribed Fxs (Delivered / Prescribed): 15 / 15   Plan Name: Pelvis_L Site: Pelvis Technique: Isodose Plan Mode: Photon Dose Per Fraction: 2.5 Gy Prescribed Dose (Delivered / Prescribed): 37.5 Gy / 37.5 Gy Prescribed Fxs (Delivered / Prescribed): 15 / 15  PAST 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   with abnormal R wave progression noted on EKG   MRSA (methicillin resistant Staphylococcus aureus)    Pre-diabetes    Rectal carcinoma (HCC) 03/19/2022       PAST SURGICAL HISTORY: Past Surgical History:  Procedure Laterality Date   COLONOSCOPY     HUMERUS IM NAIL Right 04/12/2022  Procedure: INTRAMEDULLARY (IM) NAIL HUMERAL;  Surgeon: Bjorn Pippin, MD;  Location: [facility];  Service: Orthopedics;  Laterality: Right;   IR IMAGING GUIDED PORT INSERTION  04/05/2022   TOTAL HIP ARTHROPLASTY Left 02/06/2022   Procedure: TOTAL HIP ARTHROPLASTY;  Surgeon: Joen Laura, MD;  Location: [facility];  Service: Orthopedics;  Laterality: Left;     FAMILY HISTORY:   Family History  Problem Relation Age of Onset   Stroke Mother    Hypertension Mother    Heart disease Father    Alzheimer's disease Father    Hypertension Brother    Alcohol abuse Brother    Cancer Maternal Aunt    Heart disease Maternal Aunt    Cancer Maternal Uncle    Heart disease Maternal Uncle    Cancer Paternal Aunt    Heart disease Paternal Aunt    Cancer Paternal Uncle    Heart disease Paternal Uncle    Heart disease Paternal Grandmother    Alzheimer's disease Paternal Grandfather    Stomach cancer Neg Hx    Rectal cancer Neg Hx    Esophageal cancer Neg Hx    Colon cancer Neg Hx      SOCIAL HISTORY:  reports that he has never smoked. He has never used smokeless tobacco. He reports that he does not drink alcohol and does not use drugs.  The patient is divorced and lives in [place].     ALLERGIES: Codeine, Dilaudid [hydromorphone hcl], and Lisinopril   MEDICATIONS:  Current Outpatient Medications  Medication Sig Dispense Refill   clindamycin (CLEOCIN-T) 1 % lotion Apply to rash as needed. (Patient not taking: Reported on 11/21/2022) 60 mL 0   cyanocobalamin (VITAMIN B12) 1000 MCG tablet Take 1 tablet (1,000 mcg total) by mouth daily. 30 tablet 1   doxycycline (VIBRA-TABS) 100 MG tablet Take 1 tablet (100 mg total) by mouth 2 (two) times daily. 60 tablet 3   DULoxetine (CYMBALTA) 30 MG capsule Take 1 capsule (30 mg total) by mouth daily. 30 capsule 0   gabapentin (NEURONTIN) 300 MG capsule Take 1 capsule (300 mg total) by mouth at bedtime. (Patient not taking: Reported on 07/31/2022) 30 capsule 1   lidocaine-prilocaine (EMLA) cream Apply a quarter-sized amount to port a cath site and cover with plastic wrap one hour prior to infusion appointments (Patient not taking: Reported on 09/11/2022) 30 g 3   magnesium oxide (MAG-OX) 400 (240 Mg) MG tablet Take 1 tablet (400 mg total) by mouth daily. 30 tablet 0   ondansetron (ZOFRAN) 8 MG tablet Take 2 tablets (16 mg total) by  mouth every 8 (eight) hours as needed for nausea or vomiting. (Patient not taking: Reported on 08/14/2022) 20 tablet 3   oxyCODONE ER (XTAMPZA ER) 36 MG C12A Take 1 capsule (36 mg total) by mouth every 12 (twelve) hours. 60 capsule 0   pantoprazole (PROTONIX) 20 MG tablet Take 1 tablet (20 mg total) by mouth daily. 30 tablet 0   polyethylene glycol (MIRALAX / GLYCOLAX) 17 g packet Take 17 g by mouth daily as needed for mild constipation. 14 each 0   potassium chloride (KLOR-CON M) 10 MEQ tablet Take 1 tablet (10 mEq total) by mouth daily. 30 tablet 0   senna-docusate (SENOKOT-S) 8.6-50 MG tablet Take 1 tablet by mouth 2 (two) times daily. 30 tablet 0   No current facility-administered medications for this encounter.     REVIEW OF SYSTEMS: On review of systems, the patient reports that he is having pain  in his right hip region not only with sitting but in any position. He describes this as shooting in nature, involving the anterior thigh,and posterior thigh down to the right lower leg anteriorly. He finds that he is having constipation not responding to senekot and 4 days of taking once daily miralax. No other complaints are verbalized.   PHYSICAL EXAM:  Wt Readings from Last 3 Encounters:  11/27/22 209 lb (94.8 kg)  11/05/22 211 lb 13.8 oz (96.1 kg)  10/14/22 212 lb (96.2 kg)   Temp Readings from Last 3 Encounters:  11/27/22 98.3 F (36.8 C) (Temporal)  11/23/22 98.4 F (36.9 C) (Oral)  11/21/22 98.3 F (36.8 C) (Oral)   BP Readings from Last 3 Encounters:  11/27/22 114/77  11/23/22 128/78  11/21/22 131/86   Pulse Readings from Last 3 Encounters:  11/27/22 86  11/23/22 86  11/21/22 (!) 101   Pain Assessment Pain Score: 0-No pain/10  In general this is a chronically ill appearing Caucasian male in no acute distress. He's alert and oriented x4 and appropriate throughout the examination. Cardiopulmonary assessment is negative for acute distress and he exhibits normal effort.      ECOG = 1  0 - Asymptomatic (Fully active, able to carry on all predisease activities without restriction)  1 - Symptomatic but completely ambulatory (Restricted in physically strenuous activity but ambulatory and able to carry out work of a light or sedentary nature. For example, light housework, office work)  2 - Symptomatic, <50% in bed during the day (Ambulatory and capable of all self care but unable to carry out any work activities. Up and about more than 50% of waking hours)  3 - Symptomatic, >50% in bed, but not bedbound (Capable of only limited self-care, confined to bed or chair 50% or more of waking hours)  4 - Bedbound (Completely disabled. Cannot carry on any self-care. Totally confined to bed or chair)  5 - Death   Santiago Glad MM, Creech RH, Tormey DC, et al. [phone]). "Toxicity and response criteria of the [facility]". Am. Evlyn Clines. Oncol. 5 (6): 649-55    LABORATORY DATA:  Lab Results  Component Value Date   WBC 4.0 11/21/2022   HGB 9.3 (L) 11/21/2022   HCT 30.1 (L) 11/21/2022   MCV 89.3 11/21/2022   PLT 286 11/21/2022   Lab Results  Component Value Date   NA 137 11/21/2022   K 3.4 (L) 11/21/2022   CL 102 11/21/2022   CO2 26 11/21/2022   Lab Results  Component Value Date   ALT 7 11/21/2022   AST 24 11/21/2022   ALKPHOS 422 (H) 11/21/2022   BILITOT 0.9 11/21/2022      RADIOGRAPHY: DG Hip Unilat W or W/O Pelvis 1 View Right  Result Date: 11/21/2022 CLINICAL DATA:  Rectal cancer with bone metastasis, right ischium. Right hip pain. Rule out fracture. EXAM: DG HIP (WITH OR WITHOUT PELVIS) 1V RIGHT; RIGHT FEMUR 1 VIEW COMPARISON:  CT 10/14/2022 FINDINGS: Pelvis and right hip: Sclerosis about the left iliac bone corresponds to sclerotic bone lesion on recent CT. Additional areas of bony sclerosis involving the sacrum are not as well demonstrated. Expansile lesion involving the left superior pubic ramus is again seen. Ill-defined areas of  sclerosis involving the right posterior acetabulum and ischium corresponding to sclerotic metastatic disease. There is no radiographic evidence of fracture. The femoral head is well seated, no hip dislocation. Left hip arthroplasty is intact were visualized. Right femur: Linear area of sclerosis  in the distal femoral shaft is of unknown significance. There is no cortical thinning. Moderate osteoarthritis of the knee. No femur fracture. IMPRESSION: 1. No radiographic evidence of fracture of the right hip or right femur. Particularly, no evidence of right acetabular fracture. If clinically indicated, MRI is more sensitive for assessment of pathologic fracture. 2. Multifocal sclerotic bone lesions consistent with known metastatic disease. 3. Linear sclerosis involving the distal femoral shaft may represent a sclerotic lesion, but is nonspecific. There is no cortical thinning. Electronically Signed   By: Narda Rutherford M.D.   On: 11/21/2022 15:45   DG FEMUR 1V RIGHT  Result Date: 11/21/2022 CLINICAL DATA:  Rectal cancer with bone metastasis, right ischium. Right hip pain. Rule out fracture. EXAM: DG HIP (WITH OR WITHOUT PELVIS) 1V RIGHT; RIGHT FEMUR 1 VIEW COMPARISON:  CT 10/14/2022 FINDINGS: Pelvis and right hip: Sclerosis about the left iliac bone corresponds to sclerotic bone lesion on recent CT. Additional areas of bony sclerosis involving the sacrum are not as well demonstrated. Expansile lesion involving the left superior pubic ramus is again seen. Ill-defined areas of sclerosis involving the right posterior acetabulum and ischium corresponding to sclerotic metastatic disease. There is no radiographic evidence of fracture. The femoral head is well seated, no hip dislocation. Left hip arthroplasty is intact were visualized. Right femur: Linear area of sclerosis in the distal femoral shaft is of unknown significance. There is no cortical thinning. Moderate osteoarthritis of the knee. No femur fracture.  IMPRESSION: 1. No radiographic evidence of fracture of the right hip or right femur. Particularly, no evidence of right acetabular fracture. If clinically indicated, MRI is more sensitive for assessment of pathologic fracture. 2. Multifocal sclerotic bone lesions consistent with known metastatic disease. 3. Linear sclerosis involving the distal femoral shaft may represent a sclerotic lesion, but is nonspecific. There is no cortical thinning. Electronically Signed   By: Narda Rutherford M.D.   On: 11/21/2022 15:45       IMPRESSION/PLAN: 1. Stage IV poorly differentiated adenocarcinoma of the rectum with bone metastases.  Dr. Mitzi Hansen has reviewed his previous scans and we have discussed his case.  It appears that there is a dictation error in the location of his disease as on personal review Dr. Mitzi Hansen believes the patient's right hip pain is involving the right pelvis especially involving the right ischium.  He would offer 3 weeks of palliative radiation to this right hip region as the patient is also on systemic chemotherapy at this time.  We discussed the risks, benefits, short and long-term effects of treatment and the patient is interested in proceeding. Written consent is obtained and placed in the chart, a copy was provided to the patient. He will simulate today and we anticipate starting therapy early next week while he continues systemic therapy with Dr. Leonides Schanz. 2. Painful bone disease as per #1.  He will continue his current regimen of pain management and we anticipate improvement with palliative radiotherapy as above.  In a visit lasting 60 minutes, greater than 50% of the time was spent face to face discussing the patient's condition, in preparation for the discussion, and coordinating the patient's care.   The above documentation reflects my direct findings during this shared patient visit. Please see the separate note by Dr. Mitzi Hansen on this date for the remainder of the patient's plan of  care.    Osker Mason, [facility]   **Disclaimer: This note was dictated with voice recognition software. Similar sounding words can inadvertently be transcribed  and this note may contain transcription errors which may not have been corrected upon publication of note.**

## 2022-11-27 NOTE — Patient Instructions (Signed)
VISIT SUMMARY:   During your visit, we discussed your recent onset of right hip pain, which has been causing you difficulty in daily activities. 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 We also discussed your ongoing issues with constipation and your current pain management strategy.   YOUR PLAN:   -METASTATIC CANCER TO THE ISCHIUM: This means that your cancer has spread to your hip bone. To help manage this, we plan to start radiation therapy for three weeks, five days a week. We would like to try to start on 12/02/2022 but will schedule this today. You will also continue your chemotherapy with Dr. Leonides Schanz.   -CONSTIPATION: Your constipation is likely due to your pain medication. We recommend you take Milk of Magnesia twice daily for 2-3 days until your bowel movements regularize. After this, you should back to taking Miralax once daily, with the option to increase up to four times daily if needed.   -PAIN MANAGEMENT: You are currently on a time-release pain management regimen and short acting regimen. We will continue this strategy, with the goal of reducing your need for pain medication after your radiation therapy is completed.

## 2022-11-27 NOTE — Progress Notes (Signed)
Nursing interview for Rectal carcinoma w/ Bone Mets (RT hip). Patient identity verified x2.  Patient reports occasional RT hip pain 8/10. Denies ROM issues. Patient also reports severe constipation x4 days. Miralax/ Senakot is not helping. Patient denies any other related (RT hip) issues at this time.  Meaningful use complete.  Vitals- BP 114/77 (BP Location: Right Arm, Patient Position: Sitting, Cuff Size: Normal)   Pulse 86   Temp 98.3 F (36.8 C) (Temporal)   Resp 19   Ht 5\' 11"  (1.803 m)   Wt 209 lb (94.8 kg)   SpO2 100%   BMI 29.15 kg/m   This concludes the interaction.  Ruel Favors, LPN

## 2022-11-27 NOTE — Progress Notes (Addendum)
VISIT SUMMARY:   During your visit, we discussed your recent onset of right hip pain, which has been causing you difficulty in daily activities. 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 We also discussed your ongoing issues with constipation and your current pain management strategy.   YOUR PLAN:   -METASTATIC CANCER TO THE ISCHIUM: This means that your cancer has spread to your hip bone. To help manage this, we plan to start radiation therapy for three weeks, five days a week. We would like to try to start on 12/02/2022 but will schedule this today. You will also continue your chemotherapy with Dr. Leonides Schanz.   -CONSTIPATION: Your constipation is likely due to your pain medication. We recommend you take Milk of Magnesia twice daily for 2-3 days until your bowel movements regularize. After this, you should back to taking Miralax once daily, with the option to increase up to four times daily if needed.   -PAIN MANAGEMENT: You are currently on a time-release pain management regimen and short acting regimen. We will continue this strategy, with the goal of reducing your need for pain medication after your radiation therapy is completed.

## 2022-11-28 ENCOUNTER — Inpatient Hospital Stay (HOSPITAL_BASED_OUTPATIENT_CLINIC_OR_DEPARTMENT_OTHER): Payer: Medicare HMO | Admitting: Nurse Practitioner

## 2022-11-28 ENCOUNTER — Ambulatory Visit: Payer: Medicare HMO | Admitting: Radiation Oncology

## 2022-11-28 ENCOUNTER — Encounter: Payer: Self-pay | Admitting: Nurse Practitioner

## 2022-11-28 DIAGNOSIS — Z515 Encounter for palliative care: Secondary | ICD-10-CM | POA: Diagnosis not present

## 2022-11-28 DIAGNOSIS — C20 Malignant neoplasm of rectum: Secondary | ICD-10-CM

## 2022-11-28 DIAGNOSIS — Z510 Encounter for antineoplastic radiation therapy: Secondary | ICD-10-CM | POA: Diagnosis not present

## 2022-11-28 DIAGNOSIS — C7951 Secondary malignant neoplasm of bone: Secondary | ICD-10-CM | POA: Diagnosis not present

## 2022-11-28 DIAGNOSIS — G893 Neoplasm related pain (acute) (chronic): Secondary | ICD-10-CM | POA: Diagnosis not present

## 2022-11-28 DIAGNOSIS — R530 Neoplastic (malignant) related fatigue: Secondary | ICD-10-CM | POA: Diagnosis not present

## 2022-11-29 ENCOUNTER — Telehealth: Payer: Self-pay

## 2022-11-29 ENCOUNTER — Other Ambulatory Visit: Payer: Self-pay | Admitting: Hematology and Oncology

## 2022-11-29 ENCOUNTER — Ambulatory Visit: Payer: Medicare HMO

## 2022-11-29 ENCOUNTER — Encounter: Payer: Self-pay | Admitting: Hematology and Oncology

## 2022-11-29 DIAGNOSIS — G893 Neoplasm related pain (acute) (chronic): Secondary | ICD-10-CM

## 2022-11-29 DIAGNOSIS — C20 Malignant neoplasm of rectum: Secondary | ICD-10-CM

## 2022-11-29 DIAGNOSIS — Z515 Encounter for palliative care: Secondary | ICD-10-CM

## 2022-11-29 NOTE — Telephone Encounter (Signed)
Rx auth form faxed to [facility]. Fax confirmation received.

## 2022-11-29 NOTE — Telephone Encounter (Signed)
Signed by Dr. Leonides Schanz, medication list fax returned to [facility]. Fax confirmation received.

## 2022-12-02 ENCOUNTER — Ambulatory Visit: Payer: Medicare HMO

## 2022-12-02 ENCOUNTER — Encounter: Payer: Self-pay | Admitting: Hematology and Oncology

## 2022-12-02 MED ORDER — XTAMPZA ER 36 MG PO C12A: 36.0000 mg | EXTENDED_RELEASE_CAPSULE | Freq: Two times a day (BID) | ORAL | 0 refills | Status: DC

## 2022-12-03 ENCOUNTER — Ambulatory Visit: Payer: Medicare HMO

## 2022-12-03 DIAGNOSIS — G893 Neoplasm related pain (acute) (chronic): Secondary | ICD-10-CM | POA: Diagnosis not present

## 2022-12-03 DIAGNOSIS — C20 Malignant neoplasm of rectum: Secondary | ICD-10-CM | POA: Diagnosis not present

## 2022-12-03 DIAGNOSIS — I10 Essential (primary) hypertension: Secondary | ICD-10-CM | POA: Diagnosis not present

## 2022-12-03 DIAGNOSIS — E785 Hyperlipidemia, unspecified: Secondary | ICD-10-CM | POA: Diagnosis not present

## 2022-12-03 DIAGNOSIS — E119 Type 2 diabetes mellitus without complications: Secondary | ICD-10-CM | POA: Diagnosis not present

## 2022-12-03 DIAGNOSIS — D630 Anemia in neoplastic disease: Secondary | ICD-10-CM | POA: Diagnosis not present

## 2022-12-03 DIAGNOSIS — M1990 Unspecified osteoarthritis, unspecified site: Secondary | ICD-10-CM | POA: Diagnosis not present

## 2022-12-03 DIAGNOSIS — C7951 Secondary malignant neoplasm of bone: Secondary | ICD-10-CM | POA: Diagnosis not present

## 2022-12-03 DIAGNOSIS — D500 Iron deficiency anemia secondary to blood loss (chronic): Secondary | ICD-10-CM | POA: Diagnosis not present

## 2022-12-04 ENCOUNTER — Ambulatory Visit: Payer: Medicare HMO

## 2022-12-05 ENCOUNTER — Inpatient Hospital Stay (HOSPITAL_BASED_OUTPATIENT_CLINIC_OR_DEPARTMENT_OTHER): Payer: Medicare HMO | Admitting: Nurse Practitioner

## 2022-12-05 ENCOUNTER — Inpatient Hospital Stay: Payer: Medicare HMO

## 2022-12-05 ENCOUNTER — Ambulatory Visit: Payer: Medicare HMO

## 2022-12-05 ENCOUNTER — Other Ambulatory Visit: Payer: Self-pay

## 2022-12-05 ENCOUNTER — Encounter: Payer: Self-pay | Admitting: Nurse Practitioner

## 2022-12-05 ENCOUNTER — Inpatient Hospital Stay: Payer: Medicare HMO | Admitting: Hematology and Oncology

## 2022-12-05 ENCOUNTER — Other Ambulatory Visit (HOSPITAL_COMMUNITY): Payer: Self-pay

## 2022-12-05 VITALS — BP 130/86 | HR 85 | Temp 97.9°F | Resp 16 | Wt 191.5 lb

## 2022-12-05 DIAGNOSIS — Z5112 Encounter for antineoplastic immunotherapy: Secondary | ICD-10-CM | POA: Diagnosis not present

## 2022-12-05 DIAGNOSIS — G893 Neoplasm related pain (acute) (chronic): Secondary | ICD-10-CM

## 2022-12-05 DIAGNOSIS — C20 Malignant neoplasm of rectum: Secondary | ICD-10-CM

## 2022-12-05 DIAGNOSIS — I10 Essential (primary) hypertension: Secondary | ICD-10-CM | POA: Diagnosis not present

## 2022-12-05 DIAGNOSIS — E785 Hyperlipidemia, unspecified: Secondary | ICD-10-CM | POA: Diagnosis not present

## 2022-12-05 DIAGNOSIS — Z515 Encounter for palliative care: Secondary | ICD-10-CM

## 2022-12-05 DIAGNOSIS — Z95828 Presence of other vascular implants and grafts: Secondary | ICD-10-CM

## 2022-12-05 DIAGNOSIS — E119 Type 2 diabetes mellitus without complications: Secondary | ICD-10-CM | POA: Diagnosis not present

## 2022-12-05 DIAGNOSIS — Z79899 Other long term (current) drug therapy: Secondary | ICD-10-CM | POA: Diagnosis not present

## 2022-12-05 DIAGNOSIS — D509 Iron deficiency anemia, unspecified: Secondary | ICD-10-CM | POA: Diagnosis not present

## 2022-12-05 DIAGNOSIS — Z5111 Encounter for antineoplastic chemotherapy: Secondary | ICD-10-CM | POA: Diagnosis not present

## 2022-12-05 DIAGNOSIS — R530 Neoplastic (malignant) related fatigue: Secondary | ICD-10-CM

## 2022-12-05 DIAGNOSIS — C7951 Secondary malignant neoplasm of bone: Secondary | ICD-10-CM

## 2022-12-05 LAB — CBC WITH DIFFERENTIAL (CANCER CENTER ONLY)
Abs Immature Granulocytes: 0.03 10*3/uL (ref 0.00–0.07)
Basophils Absolute: 0 10*3/uL (ref 0.0–0.1)
Basophils Relative: 1 %
Eosinophils Absolute: 0.1 10*3/uL (ref 0.0–0.5)
Eosinophils Relative: 4 %
HCT: 28.4 % — ABNORMAL LOW (ref 39.0–52.0)
Hemoglobin: 8.7 g/dL — ABNORMAL LOW (ref 13.0–17.0)
Immature Granulocytes: 1 %
Lymphocytes Relative: 11 %
Lymphs Abs: 0.4 10*3/uL — ABNORMAL LOW (ref 0.7–4.0)
MCH: 27.6 pg (ref 26.0–34.0)
MCHC: 30.6 g/dL (ref 30.0–36.0)
MCV: 90.2 fL (ref 80.0–100.0)
Monocytes Absolute: 0.4 10*3/uL (ref 0.1–1.0)
Monocytes Relative: 10 %
Neutro Abs: 2.8 10*3/uL (ref 1.7–7.7)
Neutrophils Relative %: 73 %
Platelet Count: 369 10*3/uL (ref 150–400)
RBC: 3.15 MIL/uL — ABNORMAL LOW (ref 4.22–5.81)
RDW: 18.6 % — ABNORMAL HIGH (ref 11.5–15.5)
WBC Count: 3.8 10*3/uL — ABNORMAL LOW (ref 4.0–10.5)
nRBC: 0 % (ref 0.0–0.2)

## 2022-12-05 LAB — CMP (CANCER CENTER ONLY)
ALT: 5 U/L (ref 0–44)
AST: 13 U/L — ABNORMAL LOW (ref 15–41)
Albumin: 3.3 g/dL — ABNORMAL LOW (ref 3.5–5.0)
Alkaline Phosphatase: 291 U/L — ABNORMAL HIGH (ref 38–126)
Anion gap: 8 (ref 5–15)
BUN: 9 mg/dL (ref 8–23)
CO2: 26 mmol/L (ref 22–32)
Calcium: 8.9 mg/dL (ref 8.9–10.3)
Chloride: 105 mmol/L (ref 98–111)
Creatinine: 0.6 mg/dL — ABNORMAL LOW (ref 0.61–1.24)
GFR, Estimated: >60 mL/min (ref >60)
Glucose, Bld: 140 mg/dL — ABNORMAL HIGH (ref 70–99)
Potassium: 3.8 mmol/L (ref 3.5–5.1)
Sodium: 139 mmol/L (ref 135–145)
Total Bilirubin: 0.7 mg/dL (ref 0.3–1.2)
Total Protein: 6.1 g/dL — ABNORMAL LOW (ref 6.5–8.1)

## 2022-12-05 LAB — MAGNESIUM: Magnesium: 1.8 mg/dL (ref 1.7–2.4)

## 2022-12-05 MED ORDER — SODIUM CHLORIDE 0.9% FLUSH
10.0000 mL | Freq: Once | INTRAVENOUS | Status: AC
  Administered 2022-12-05: 10 mL

## 2022-12-05 MED ORDER — OXYCODONE HCL 10 MG PO TABS: 10.0000 mg | ORAL_TABLET | Freq: Four times a day (QID) | ORAL | 0 refills | Status: DC | PRN

## 2022-12-05 MED ORDER — FLUOROURACIL CHEMO INJECTION 2.5 GM/50ML
400.0000 mg/m2 | Freq: Once | INTRAVENOUS | Status: AC
  Administered 2022-12-05: 850 mg via INTRAVENOUS
  Filled 2022-12-05: qty 17

## 2022-12-05 MED ORDER — OXYCODONE HCL 10 MG PO TABS
10.0000 mg | ORAL_TABLET | Freq: Four times a day (QID) | ORAL | 0 refills | Status: DC | PRN
  Filled 2022-12-05: qty 60, 15d supply, fill #0

## 2022-12-05 MED ORDER — PALONOSETRON HCL INJECTION 0.25 MG/5ML
0.2500 mg | Freq: Once | INTRAVENOUS | Status: AC
  Administered 2022-12-05: 0.25 mg via INTRAVENOUS
  Filled 2022-12-05: qty 5

## 2022-12-05 MED ORDER — DEXAMETHASONE SODIUM PHOSPHATE 10 MG/ML IJ SOLN
10.0000 mg | Freq: Once | INTRAMUSCULAR | Status: AC
  Administered 2022-12-05: 10 mg via INTRAVENOUS
  Filled 2022-12-05: qty 1

## 2022-12-05 MED ORDER — OXYCODONE HCL 5 MG PO TABS
10.0000 mg | ORAL_TABLET | Freq: Once | ORAL | Status: AC
  Administered 2022-12-05: 10 mg via ORAL
  Filled 2022-12-05: qty 2

## 2022-12-05 MED ORDER — SODIUM CHLORIDE 0.9 % IV SOLN: Freq: Once | INTRAVENOUS | Status: AC

## 2022-12-05 MED ORDER — PROCHLORPERAZINE MALEATE 10 MG PO TABS
10.0000 mg | ORAL_TABLET | Freq: Once | ORAL | Status: AC
  Administered 2022-12-05: 10 mg via ORAL
  Filled 2022-12-05: qty 1

## 2022-12-05 MED ORDER — SODIUM CHLORIDE 0.9 % IV SOLN
180.0000 mg/m2 | Freq: Once | INTRAVENOUS | Status: AC
  Administered 2022-12-05: 400 mg via INTRAVENOUS
  Filled 2022-12-05: qty 15

## 2022-12-05 MED ORDER — SODIUM CHLORIDE 0.9% FLUSH: 10.0000 mL | INTRAVENOUS | Status: DC | PRN

## 2022-12-05 MED ORDER — SODIUM CHLORIDE 0.9 % IV SOLN
2400.0000 mg/m2 | INTRAVENOUS | Status: DC
  Administered 2022-12-05: 5000 mg via INTRAVENOUS
  Filled 2022-12-05: qty 100

## 2022-12-05 MED ORDER — SODIUM CHLORIDE 0.9 % IV SOLN
400.0000 mg/m2 | Freq: Once | INTRAVENOUS | Status: AC
  Administered 2022-12-05: 848 mg via INTRAVENOUS
  Filled 2022-12-05: qty 25

## 2022-12-05 MED ORDER — SODIUM CHLORIDE 0.9 % IV SOLN
4.0000 mg/kg | Freq: Once | INTRAVENOUS | Status: AC
  Administered 2022-12-05: 360 mg via INTRAVENOUS
  Filled 2022-12-05: qty 18

## 2022-12-05 NOTE — Patient Instructions (Signed)
-   since you missed your afternoon dose of xtampza you may take one when you get home after your infusion. You should take it as soon as you get home but no later than 5pm. Then you may resume with your schedule starting with your nighttime dose at 12am  - start taking your Senakot, 2 pills at bedtime every day for constipation. If you have loose stools back down to one pill or every other day - call the office at [phone] with any questions or concerns.

## 2022-12-05 NOTE — Progress Notes (Signed)
[facility] [phone]   [phone]  PROGRESS NOTE  Patient Care Team: [name], MD as PCP - General (Hematology and Oncology) Doreatha Massed, MD as Medical Oncologist (Medical Oncology) Pickenpack-Cousar, Arty Baumgartner, NP as Nurse Practitioner (Hospice and Palliative Medicine)  DIAGNOSIS: Metastatic rectal carcinoma  ONCOLOGIC HISTORY: # Metastatic Rectal Adenocarcinoma 02/03/2022-02/11/2022: Presented to ED due to left hip pain after a mechanical fall.  02/04/2022: CT left UXN:ATFTD pathologic fracture of the left femoral neck with displacement and angulation, with underlying lytic lesions in the femoral head and neck.Additional lytic destructive lesion of the left parasymphyseal pubic bone extending throughout the superior pubic ramus to the puboacetabular junction, with pathologic fracture of the superior pubic ramus. Adjacent hypoattenuation in the pelvic component of the obturator internus muscle, suspicious for tumor involvement. Probable small lytic lesion in the inferior pubic ramus.Prominent left external iliac lymph nodes measuring up to 1.2 cm. 02/05/2022: CT CAP: Pathologic retroperitoneal and bilateral pelvic adenopathy, with lytic destructive lesions of the left superior and inferior pubic ramus and pubic body as well as a pathologic fracture through the left femoral neck. Pathologic fracture anteriorly in the right second rib. Deformity anteriorly in the right third rib probably from early pathologic fracture. Metastatic disease or myeloma strongly favored over multifocal osteomyelitis as a cause. Mildly enlarged left supraclavicular lymph node and lower thoracic periaortic lymph node. Pathologic retroperitoneal and pelvic adenopathy. 10 cm long segment of rectal wall thickening.1.1 cm exophytic lesion from the left mid kidney posterolaterally, nonspecific for complex cyst versus tumor. Bandlike atelectasis in both lungs.Small type 1  hiatal hernia.Lumbar spondylosis and degenerative disc disease causing generally mild multilevel impingement. Sigmoid colon diverticulosis.Wall thickening in the proximal stomach body, probably secondary to nondistention. 02/06/2022: Underwent left total hip arthroplasty. Pathology revealed poorly differentiated carcinoma.  SPEP/IFE did not detect monoclonal protein. PSA normal. 02/25/2022: Underwent colonoscopy that showed malignant tumor in the rectum. Pathology confirmed poorly differentiated carcinoma.  04/05/2022: Underwent port placement 04/08/2022: Cycle 1, Day 1 of FOLFOX (held bevacizumab due to recent port placement) 04/23/2022: Cycle 2, Day 1 of FOLFOX plus bevacizumab 05/07/2022: Cycle 3, Day 1 of FOLFOX plus bevacizumab 05/21/2022: Cycle 4, Day 1 of FOLFOX plus bevacizumab. Transitioned care to Dr. Caren Hazy at [facility] 07/02/2022: Cycle 6, Day 1 of FOLFOX plus bevacizumab at [facility].  07/11/2022: NM PET CT scan showed progression of hypermetabolic osseous metastatic disease, although there is some mixed change as detailed. New hypermetabolic right femoral neck metastasis places the patient at risk for pathologic right femoral neck fracture. 07/30/2022:  Cycle 1 Day 1  FOLFIRI +Panitumumab 08/14/2022: Cycle 2 Day 1 FOLFIRI + Panitumumab 08/27/2022: Cycle 3 Day 1 FOLFIRI + Panitumumab 09/11/2022: Cycle 4 Day 1 FOLFIRI (Hold Panitumumab due to acneiform rash) 10/02/2022: Cycle 5 Day 1 FOLFIRI (Hold Panitumumab due to acneiform rash) 11/07/2022: Cycle 6 Day 1 FOLFIRI plus Panitumumab dose reduced at 4 mg/kg.  11/21/2022: Cycle 7 Day 1 FOLFIRI plus Panitumumab dose reduced at 4 mg/kg.  12/05/2022: Cycle 8 Day 1 FOLFIRI plus Panitumumab dose reduced at 4 mg/kg.   HISTORY OF PRESENTING ILLNESS:  Joseph Hogan 62 y.o. male returns for follow-up for metastatic rectal cancer. He presents today to start Cycle 8, Day 1 today. He is unaccompanied for this visit.   On exam today, Mr. Greenhaw reports he  feels like his spirit is improved.  He reports that he has been having some issues with constipation due to his pain medications but reports  he thinks it is currently under control.  He reports that senna and milk of magnesia have been keeping his bowels loose.  He reports that he did miss some fortunately his dose of Xtampza today.  He reports that he has been eating well with a strong appetite.  He notes that he does have a little bit of rash on his face with some dryness but no pain or soreness.  He reports that he did have to wake up at 3 AM this morning in order to take 2 of his oxycodones.  He is doing his best to try to work with physical therapy to exercise.  Overall he is willing and able to proceed with chemotherapy at this time.  He denies any fevers, chills, sweats, shortness of breath, chest pain or cough.  A full 10 point ROS is otherwise negative.  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   with abnormal R wave progression noted on EKG   MRSA (methicillin resistant Staphylococcus aureus)    Pre-diabetes    Rectal carcinoma (HCC) 03/19/2022    SURGICAL HISTORY: Past Surgical History:  Procedure Laterality Date   COLONOSCOPY     HUMERUS IM NAIL Right 04/12/2022   Procedure: INTRAMEDULLARY (IM) NAIL HUMERAL;  Surgeon: Bjorn Pippin, MD;  Location: [facility];  Service: Orthopedics;  Laterality: Right;   IR IMAGING GUIDED PORT INSERTION  04/05/2022   TOTAL HIP ARTHROPLASTY Left 02/06/2022   Procedure: TOTAL HIP ARTHROPLASTY;  Surgeon: Joen Laura, MD;  Location: [facility];  Service: Orthopedics;  Laterality: Left;    SOCIAL HISTORY: Social History   Socioeconomic History   Marital status: Divorced    Spouse name: Not on file   Number of children: Not on file   Years of education: Not on file   Highest education level: Not on file  Occupational History   Not  on file  Tobacco Use   Smoking status: Never   Smokeless tobacco: Never  Vaping Use   Vaping status: Never Used  Substance and Sexual Activity   Alcohol use: No   Drug use: No   Sexual activity: Not Currently  Other Topics Concern   Not on file  Social History Narrative   Not on file   Social Determinants of Health   Financial Resource Strain: Not on file  Food Insecurity: No Food Insecurity (10/15/2022)   Hunger Vital Sign    Worried About Running Out of Food in the Last Year: Never true    Ran Out of Food in the Last Year: Never true  Transportation Needs: No Transportation Needs (10/15/2022)   PRAPARE - Administrator, Civil Service (Medical): No    Lack of Transportation (Non-Medical): No  Physical Activity: Not on file  Stress: Not on file  Social Connections: Not on file  Intimate Partner Violence: Not At Risk (10/15/2022)   Humiliation, Afraid, Rape, and Kick questionnaire    Fear of Current or Ex-Partner: No    Emotionally Abused: No    Physically Abused: No    Sexually Abused: No    FAMILY HISTORY: Family History  Problem Relation Age of Onset   Stroke Mother    Hypertension Mother    Heart disease Father    Alzheimer's disease Father    Hypertension Brother    Alcohol abuse Brother    Cancer  Maternal Aunt    Heart disease Maternal Aunt    Cancer Maternal Uncle    Heart disease Maternal Uncle    Cancer Paternal Aunt    Heart disease Paternal Aunt    Cancer Paternal Uncle    Heart disease Paternal Uncle    Heart disease Paternal Grandmother    Alzheimer's disease Paternal Grandfather    Stomach cancer Neg Hx    Rectal cancer Neg Hx    Esophageal cancer Neg Hx    Colon cancer Neg Hx     ALLERGIES:  is allergic to codeine, dilaudid [hydromorphone hcl], and lisinopril.  MEDICATIONS:  Current Outpatient Medications  Medication Sig Dispense Refill   clindamycin (CLEOCIN-T) 1 % lotion Apply to rash as needed. (Patient not taking: Reported on  11/21/2022) 60 mL 0   cyanocobalamin (VITAMIN B12) 1000 MCG tablet Take 1 tablet (1,000 mcg total) by mouth daily. 30 tablet 1   doxycycline (VIBRA-TABS) 100 MG tablet Take 1 tablet (100 mg total) by mouth 2 (two) times daily. 60 tablet 3   DULoxetine (CYMBALTA) 30 MG capsule Take 1 capsule (30 mg total) by mouth daily. (Patient not taking: Reported on 12/05/2022) 30 capsule 0   gabapentin (NEURONTIN) 300 MG capsule Take 1 capsule (300 mg total) by mouth at bedtime. (Patient not taking: Reported on 07/31/2022) 30 capsule 1   lidocaine-prilocaine (EMLA) cream Apply a quarter-sized amount to port a cath site and cover with plastic wrap one hour prior to infusion appointments (Patient not taking: Reported on 09/11/2022) 30 g 3   magnesium oxide (MAG-OX) 400 (240 Mg) MG tablet Take 1 tablet (400 mg total) by mouth daily. 30 tablet 0   ondansetron (ZOFRAN) 8 MG tablet Take 2 tablets (16 mg total) by mouth every 8 (eight) hours as needed for nausea or vomiting. (Patient not taking: Reported on 08/14/2022) 20 tablet 3   oxyCODONE ER (XTAMPZA ER) 36 MG C12A Take 1 capsule (36 mg total) by mouth every 12 (twelve) hours. 60 capsule 0   Oxycodone HCl 10 MG TABS Take 1 tablet (10 mg total) by mouth every 6 (six) hours as needed. 60 tablet 0   pantoprazole (PROTONIX) 20 MG tablet Take 1 tablet (20 mg total) by mouth daily. 30 tablet 0   polyethylene glycol (MIRALAX / GLYCOLAX) 17 g packet Take 17 g by mouth daily as needed for mild constipation. 14 each 0   potassium chloride (KLOR-CON M) 10 MEQ tablet Take 1 tablet (10 mEq total) by mouth daily. 30 tablet 0   senna-docusate (SENOKOT-S) 8.6-50 MG tablet Take 1 tablet by mouth 2 (two) times daily. 30 tablet 0   No current facility-administered medications for this visit.   Facility-Administered Medications Ordered in Other Visits  Medication Dose Route Frequency Provider Last Rate Last Admin   fluorouracil (ADRUCIL) 5,000 mg in sodium chloride 0.9 % 150 mL chemo  infusion  2,400 mg/m2 (Treatment Plan Recorded) Intravenous 1 day or 1 dose [name], MD       fluorouracil (ADRUCIL) chemo injection 850 mg  400 mg/m2 (Treatment Plan Recorded) Intravenous Once [name], MD       irinotecan (CAMPTOSAR) 400 mg in sodium chloride 0.9 % 500 mL chemo infusion  180 mg/m2 (Treatment Plan Recorded) Intravenous Once [name], MD 347 mL/hr at 12/05/22 1252 400 mg at 12/05/22 1252   leucovorin 848 mg in sodium chloride 0.9 % 250 mL infusion  400 mg/m2 (Treatment Plan Recorded) Intravenous Once  [name], MD 195 mL/hr at 12/05/22 1253 848 mg at 12/05/22 1253   sodium chloride flush (NS) 0.9 % injection 10 mL  10 mL Intracatheter PRN [name], MD        REVIEW OF SYSTEMS:   Constitutional: ( - ) fevers, ( - )  chills , ( - ) night sweats Eyes: ( - ) blurriness of vision, ( - ) double vision, ( - ) watery eyes Ears, nose, mouth, throat, and face: ( - ) mucositis, ( - ) sore throat Respiratory: ( - ) cough, ( - ) dyspnea, ( - ) wheezes Cardiovascular: ( - ) palpitation, ( - ) chest discomfort, ( - ) lower extremity swelling Gastrointestinal:  ( - ) nausea, ( - ) heartburn, ( - ) change in bowel habits Skin: ( + ) abnormal skin rashes Lymphatics: ( - ) new lymphadenopathy, ( - ) easy bruising Neurological: ( - ) numbness, ( - ) tingling, ( - ) new weaknesses Behavioral/Psych: ( - ) mood change, ( - ) new changes  All other systems were reviewed with the patient and are negative.  PHYSICAL EXAMINATION: ECOG PERFORMANCE STATUS: 2 - Symptomatic, <50% confined to bed  Vitals:   12/05/22 1020  BP: 130/86  Pulse: 85  Resp: 16  Temp: 97.9 F (36.6 C)  SpO2: 100%     Filed Weights   12/05/22 1020  Weight: 191 lb 8 oz (86.9 kg)      GENERAL: well appearing male in NAD. Exam performed in wheelchair.  SKIN: skin color, texture, turgor are normal,or significant lesions.   EYES: conjunctiva are pink and non-injected, sclera  clear LUNGS: clear to auscultation and percussion with normal breathing effort HEART: regular rate & rhythm and no murmurs and no lower extremity edema Musculoskeletal: no cyanosis of digits and no clubbing  PSYCH: alert & oriented x 3, fluent speech NEURO: no focal motor/sensory deficits  LABORATORY DATA:  I have reviewed the data as listed    Latest Ref Rng & Units 12/05/2022    9:41 AM 11/21/2022    7:43 AM 11/07/2022    7:44 AM  CBC  WBC 4.0 - 10.5 K/uL 3.8  4.0  10.8   Hemoglobin 13.0 - 17.0 g/dL 8.7  9.3  9.2   Hematocrit 39.0 - 52.0 % 28.4  30.1  29.4   Platelets 150 - 400 K/uL 369  286  571        Latest Ref Rng & Units 12/05/2022    9:41 AM 11/21/2022    7:43 AM 11/07/2022    7:44 AM  CMP  Glucose 70 - 99 mg/dL 409  811  914   BUN 8 - 23 mg/dL 9  8  15    Creatinine 0.61 - 1.24 mg/dL 7.82  9.56  2.13   Sodium 135 - 145 mmol/L 139  137  134   Potassium 3.5 - 5.1 mmol/L 3.8  3.4  4.3   Chloride 98 - 111 mmol/L 105  102  96   CO2 22 - 32 mmol/L 26  26  28    Calcium 8.9 - 10.3 mg/dL 8.9  8.9  9.1   Total Protein 6.5 - 8.1 g/dL 6.1  6.7  7.2   Total Bilirubin 0.3 - 1.2 mg/dL 0.7  0.9  0.7   Alkaline Phos 38 - 126 U/L 291  422  232   AST 15 - 41 U/L 13  24  39   ALT  0 - 44 U/L 5  7  21       PATHOLOGY: Rectum, biopsy POORLY DIFFERENTIATED CARCINOMA. SEE NOTE. Diagnosis Note Addendum: Immunohistochemistry shows the carcinoma is positive with MOC-31, cytokeratin 20 and CDX2. Tumor is negative with cytokeratin 7, cytokeratin 5/6, p40, TTF-1, Napsin A, prostate-specific antigen and prostein. The immunophenotype is consistent with primary colorectal adenocarcinoma.  RADIOGRAPHIC STUDIES: DG Hip Unilat W or W/O Pelvis 1 View Right  Result Date: 11/21/2022 CLINICAL DATA:  Rectal cancer with bone metastasis, right ischium. Right hip pain. Rule out fracture. EXAM: DG HIP (WITH OR WITHOUT PELVIS) 1V RIGHT; RIGHT FEMUR 1 VIEW COMPARISON:  CT 10/14/2022 FINDINGS: Pelvis and  right hip: Sclerosis about the left iliac bone corresponds to sclerotic bone lesion on recent CT. Additional areas of bony sclerosis involving the sacrum are not as well demonstrated. Expansile lesion involving the left superior pubic ramus is again seen. Ill-defined areas of sclerosis involving the right posterior acetabulum and ischium corresponding to sclerotic metastatic disease. There is no radiographic evidence of fracture. The femoral head is well seated, no hip dislocation. Left hip arthroplasty is intact were visualized. Right femur: Linear area of sclerosis in the distal femoral shaft is of unknown significance. There is no cortical thinning. Moderate osteoarthritis of the knee. No femur fracture. IMPRESSION: 1. No radiographic evidence of fracture of the right hip or right femur. Particularly, no evidence of right acetabular fracture. If clinically indicated, MRI is more sensitive for assessment of pathologic fracture. 2. Multifocal sclerotic bone lesions consistent with known metastatic disease. 3. Linear sclerosis involving the distal femoral shaft may represent a sclerotic lesion, but is nonspecific. There is no cortical thinning. Electronically Signed   By: Narda Rutherford M.D.   On: 11/21/2022 15:45   DG FEMUR 1V RIGHT  Result Date: 11/21/2022 CLINICAL DATA:  Rectal cancer with bone metastasis, right ischium. Right hip pain. Rule out fracture. EXAM: DG HIP (WITH OR WITHOUT PELVIS) 1V RIGHT; RIGHT FEMUR 1 VIEW COMPARISON:  CT 10/14/2022 FINDINGS: Pelvis and right hip: Sclerosis about the left iliac bone corresponds to sclerotic bone lesion on recent CT. Additional areas of bony sclerosis involving the sacrum are not as well demonstrated. Expansile lesion involving the left superior pubic ramus is again seen. Ill-defined areas of sclerosis involving the right posterior acetabulum and ischium corresponding to sclerotic metastatic disease. There is no radiographic evidence of fracture. The femoral  head is well seated, no hip dislocation. Left hip arthroplasty is intact were visualized. Right femur: Linear area of sclerosis in the distal femoral shaft is of unknown significance. There is no cortical thinning. Moderate osteoarthritis of the knee. No femur fracture. IMPRESSION: 1. No radiographic evidence of fracture of the right hip or right femur. Particularly, no evidence of right acetabular fracture. If clinically indicated, MRI is more sensitive for assessment of pathologic fracture. 2. Multifocal sclerotic bone lesions consistent with known metastatic disease. 3. Linear sclerosis involving the distal femoral shaft may represent a sclerotic lesion, but is nonspecific. There is no cortical thinning. Electronically Signed   By: Narda Rutherford M.D.   On: 11/21/2022 15:45    ASSESSMENT & PLAN AREEB KORBEL is a 62 y.o. male who presents to the clinic for a follow up for rectal carcinoma.   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  --We reviewed that his cancer is incurable and mainstay  treatment is chemotherapy. --Recommend chemotherapy regimen FOLFOX plus Bevacizumab q 2 weeks, started on 04/08/2022.  --progression noted on last PET/CT scan from 07/11/2022. D/c FOLFOX + Bev and start FOLFIRI + Panitumumab on 07/31/2022. PLAN: --Due for Cycle 8, Day 1 today --Labs from today reviewed and adequate for treatment. WBC 3.8, Hgb 8.7, MCV 90.2, Plt 369. Creatinine and LFTs adequate.  --Proceed with treatment today without any dose modifications. Continue Panitumumab today but dose reduced at 4 mg/kg.  --RTC in 2 weeks before Cycle 9, Day 1 of FOLFIRI + Panitumumab   #Acneiform rash-- resolved --Secondary to panitumumab --Will resume panitumumab dose reduced at 4 mg/kg.  --Refilled doxycycline 100 mg BID  and Cleocin gel for spot treatment. Advised him the importance of taking both medications to minimize severity of rash.   #Microcytic anemia: #Thrombocytosis: --patient received IV venofer 200 mg once a week x 3  #Right shoulder pain # Leg Pain  # Bone Pain from Metastatic Cancer  --Right shoulder xray form 04/08/2022 showed destructive lesion within the proximal to mid right humerus shaft with acute to subacture pathologic fracture. --Patient underwent pinning of right humerus on 04/12/2022 --Received palliative radiation from 05/14/2022-06/03/2022 to right humerus, L3 spine and pelvis --Current pain regimen includes Xtampza 27 mg q 12 hours, oxycodone 5-10 mg q 6 hours for breakthrough pain.   --Meeting with Palliative care team today.  #Supportive Care -- chemotherapy education complete -- port placement complete.  -- zofran 8mg  q8H PRN and compazine 10mg  PO q6H for nausea -- EMLA cream for port  No orders of the defined types were placed in this encounter.   All questions were answered. The patient knows to call the clinic with any problems, questions or concerns.  I have spent a total of 30 minutes minutes of face-to-face and non-face-to-face time, preparing to see the patient, performing a medically appropriate examination, counseling and educating the patient, ordering medications/tests/procedures,  documenting clinical information in the electronic health record,  and care coordination.    Ulysees Barns, MD Department of Hematology/Oncology [facility] Phone: [phone] Pager: [phone] Email: [email or web address]

## 2022-12-05 NOTE — Patient Instructions (Signed)
[facility]  Discharge Instructions: Thank you for choosing [facility] to provide your oncology and hematology care.   If you have a lab appointment with the Cancer Center, please go directly to the Cancer Center and check in at the registration area.   Wear comfortable clothing and clothing appropriate for easy access to any Portacath or PICC line.   We strive to give you quality time with your provider. You may need to reschedule your appointment if you arrive late (15 or more minutes).  Arriving late affects you and other patients whose appointments are after yours.  Also, if you miss three or more appointments without notifying the office, you may be dismissed from the clinic at the provider's discretion.      For prescription refill requests, have your pharmacy contact our office and allow 72 hours for refills to be completed.    Today you received the following chemotherapy and/or immunotherapy agents: Vectibix/Irinotecan/Leucovorin/Fluorouracil     To help prevent nausea and vomiting after your treatment, we encourage you to take your nausea medication as directed.  BELOW ARE SYMPTOMS THAT SHOULD BE REPORTED IMMEDIATELY: *FEVER GREATER THAN 100.4 F (38 C) OR HIGHER *CHILLS OR SWEATING *NAUSEA AND VOMITING THAT IS NOT CONTROLLED WITH YOUR NAUSEA MEDICATION *UNUSUAL SHORTNESS OF BREATH *UNUSUAL BRUISING OR BLEEDING *URINARY PROBLEMS (pain or burning when urinating, or frequent urination) *BOWEL PROBLEMS (unusual diarrhea, constipation, pain near the anus) TENDERNESS IN MOUTH AND THROAT WITH OR WITHOUT PRESENCE OF ULCERS (sore throat, sores in mouth, or a toothache) UNUSUAL RASH, SWELLING OR PAIN  UNUSUAL VAGINAL DISCHARGE OR ITCHING   Items with * indicate a potential emergency and should be followed up as soon as possible or go to the Emergency Department if any problems should occur.  Please show the CHEMOTHERAPY ALERT CARD or  IMMUNOTHERAPY ALERT CARD at check-in to the Emergency Department and triage nurse.  Should you have questions after your visit or need to cancel or reschedule your appointment, please contact [facility]  Dept: [phone]  and follow the prompts.  Office hours are 8:00 a.m. to 4:30 p.m. Monday - Friday. Please note that voicemails left after 4:00 p.m. may not be returned until the following business day.  We are closed weekends and major holidays. You have access to a nurse at all times for urgent questions. Please call the main number to the clinic Dept: [phone] and follow the prompts.   For any non-urgent questions, you may also contact your provider using MyChart. We now offer e-Visits for anyone 20 and older to request care online for non-urgent symptoms. For details visit [email or web address].   Also download the MyChart app! Go to the app store, search "MyChart", open the app, select , and log in with your MyChart username and password.

## 2022-12-05 NOTE — Progress Notes (Signed)
Palliative Medicine [facility]  [phone] [phone]   Name: Joseph Hogan Date: 12/05/2022 MRN: [id number]  DOB: November 16, 1960  Patient Care Team: [name], MD as PCP - General (Hematology and Oncology) Doreatha Massed, MD as Medical Oncologist (Medical Oncology) Pickenpack-Cousar, Arty Baumgartner, NP as Nurse Practitioner [facility] and Palliative Medicine)   I connected with [name] on 12/05/22 at 10:30 AM EDT by phone and verified that I am speaking with the correct person using two identifiers.   I discussed the limitations, risks, security and privacy concerns of performing an evaluation and management service by telemedicine and the availability of in-person appointments. I also discussed with the patient that there may be a patient responsible charge related to this service. The patient expressed understanding and agreed to proceed.   Other persons participating in the visit and their role in the encounter: n/a   Patient's location: home  Provider's location: [facility]   Chief Complaint: f/u of symptom management    INTERVAL HISTORY: Joseph Hogan is a 62 y.o. 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 Palliative ask to see for symptom management and goals of care.   SOCIAL HISTORY:     reports that he has never smoked. He has never used smokeless tobacco. He reports that he does not drink alcohol and does not use drugs.  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  MEDICATIONS:  Current Outpatient Medications  Medication Sig Dispense Refill   clindamycin (CLEOCIN-T) 1 % lotion Apply to rash as needed. (Patient not taking: Reported on 11/21/2022) 60 mL 0   cyanocobalamin (VITAMIN B12) 1000 MCG tablet Take 1 tablet (1,000 mcg total) by mouth daily. 30 tablet 1   doxycycline (VIBRA-TABS) 100 MG tablet Take 1 tablet (100 mg total) by mouth 2 (two) times daily. 60 tablet 3   DULoxetine (CYMBALTA) 30 MG capsule Take 1 capsule (30 mg total) by mouth daily. (Patient not taking: Reported on 12/05/2022) 30 capsule 0   gabapentin (NEURONTIN) 300 MG capsule Take 1 capsule (300 mg total) by mouth at bedtime. (Patient not taking: Reported on 07/31/2022) 30 capsule 1   lidocaine-prilocaine (EMLA) cream Apply a quarter-sized amount to port a cath site and cover with plastic wrap one hour prior to infusion appointments (Patient not taking: Reported on 09/11/2022) 30 g 3   magnesium oxide (MAG-OX) 400 (240 Mg) MG tablet Take 1 tablet (400 mg total) by mouth daily. 30 tablet 0   ondansetron (ZOFRAN) 8 MG tablet Take 2 tablets (16 mg total) by mouth every 8 (eight) hours as needed for nausea or vomiting. (Patient not taking: Reported on 08/14/2022) 20 tablet 3   oxyCODONE ER (XTAMPZA ER) 36 MG C12A Take 1 capsule (36 mg total) by mouth every 12 (twelve) hours. 60 capsule 0   Oxycodone HCl 10 MG TABS Take 1 tablet (10 mg total) by mouth every 6 (six) hours  as needed. 60 tablet 0   pantoprazole (PROTONIX) 20 MG tablet Take 1 tablet (20 mg total) by mouth daily. 30 tablet 0   polyethylene glycol (MIRALAX / GLYCOLAX) 17 g packet Take 17 g by mouth daily as needed for mild constipation. 14 each 0   potassium chloride (KLOR-CON M) 10 MEQ tablet Take 1 tablet (10 mEq total) by mouth daily. 30 tablet 0   senna-docusate (SENOKOT-S) 8.6-50 MG tablet Take 1 tablet by mouth 2 (two) times daily.  30 tablet 0   No current facility-administered medications for this visit.   Facility-Administered Medications Ordered in Other Visits  Medication Dose Route Frequency Provider Last Rate Last Admin   fluorouracil (ADRUCIL) 5,000 mg in sodium chloride 0.9 % 150 mL chemo infusion  2,400 mg/m2 (Treatment Plan Recorded) Intravenous 1 day or 1 dose Ulysees Barns IV, MD   Infusion Verify at 12/05/22 1440   sodium chloride flush (NS) 0.9 % injection 10 mL  10 mL Intracatheter PRN [name], MD        VITAL SIGNS: There were no vitals taken for this visit. There were no vitals filed for this visit.  Estimated body mass index is 26.71 kg/m as calculated from the following:   Height as of 11/27/22: 5\' 11"  (1.803 m).   Weight as of an earlier encounter on 12/05/22: 191 lb 8 oz (86.9 kg).   PERFORMANCE STATUS (ECOG) : 2 - Symptomatic, <50% confined to bed  Discussed the use of AI scribe software for clinical note transcription with the patient, who gave verbal consent to proceed.   IMPRESSION:  Joseph Hogan presents with a recent exacerbation of pain due to a maneuver in a recliner. The pain was severe enough to necessitate an increase in his usual pain medication dosage, from 5mg  to 10mg . The patient reports that the increased dosage effectively manages the pain. Denies nausea, vomiting, or diarrhea. Appetite has been good. Is trying to remain active around the home however with some limitations due to pain and fatigue.   He also reports a history of constipation, which is currently being managed with two Senna tablets at bedtime. The patient has expressed a desire to reduce the frequency of this medication if he experiences loose stools. Encouraged him to also take Miralax daily in the setting of opioid use.   The patient's pain management regimen includes a long-acting medication, which he typically takes at 12-hour intervals. However, the patient forgot to bring his medication to his  appointment and will therefore miss his midday dose. The patient plans to take the missed dose upon returning home and then resume his regular dosing schedule. He and son verbalized understanding of this plan.  Joseph Hogan also reports a minor issue with his pill box, as the lids have started to come off. He plans to purchase a new, larger pill box from a store. We discussed his medication management and adherence.   Joseph Hogan reports pain is controlled on current regimen. No changes at this time. We will continue to closely monitor.   Goals of Care  11/07/22- We discussed his current illness and what it means in the larger context of his on-going co-morbidities. Natural disease trajectory and expectations were discussed.   Patient's son verbalized understanding of his father's current illness. Patient also able to acknowledge some form of understanding but with some obvious signs of confusion when it comes to memory recall. He mentions his children assist with most of his medical needs.  I empathetically approached discussions regarding healthcare limitations including code status and advanced directives. Son, [place] states they are in the process of completing documents. Joseph Hogan (patient) confirms. They verbalize patient's son Keshav Showman is primary medical decision maker. They request patient to remain full code with full scope care. Mr. Varble and son are clear in expressed wishes to continue to treat the treatable allowing patient every opportunity to continue to do as good as he can for as long as he can while managing his symptoms.     We discussed Her current illness and what it means in the larger context of Her on-going co-morbidities. Natural disease trajectory and expectations were discussed.  I discussed the importance of continued conversation with family and their medical providers regarding overall plan of care and treatment options, ensuring decisions are within the context of the  patients values and GOCs.  PLAN:  Neoplasm Related Pain Pain exacerbated by movement. Current regimen includes Xtampza and oxycodone. Patient reports needing to take two doses of breakthrough medication for adequate pain control. -Continue current regimen. -Ensure patient is taking correct doses by using a pill box.  Constipation Likely secondary to opioid use. Currently managed with Senokot. -Increase Senokot to two tablets at bedtime daily. -Miralax once daily  Medication Management Patient forgot to bring Xtampza for mid-day dose during treatment. -Take missed Xtampza dose upon returning home. -Resume regular dosing schedule (12pm and 12am) the following day. -Check medication refills and send in necessary prescriptions. -Oxycodone 10mg  during infusion   General Health Maintenance -Consider purchasing a more durable pill box for medication management. -Ongoing symptom management needs and goals of care discussions.  -I will plan to see patient back in 3-4 weeks in collaboration to other oncology appointments. Will continue to closely support.    Patient expressed understanding and was in agreement with this plan. He also understands that He can call the clinic at any time with any questions, concerns, or complaints.   Any controlled substances utilized were prescribed in the context of palliative care. PDMP has been reviewed.    Visit consisted of counseling and education dealing with the complex and emotionally intense issues of symptom management and palliative care in the setting of serious and potentially life-threatening illness.  Willette Alma, AGPCNP-BC  Palliative Medicine Team/[facility]  *Please note that this is a verbal dictation therefore any spelling or grammatical errors are due to the "Dragon Medical One" system interpretation.

## 2022-12-06 ENCOUNTER — Ambulatory Visit: Payer: Medicare HMO

## 2022-12-07 ENCOUNTER — Inpatient Hospital Stay: Payer: Medicare HMO

## 2022-12-07 VITALS — BP 127/70 | HR 64 | Temp 97.5°F | Resp 16

## 2022-12-07 DIAGNOSIS — E119 Type 2 diabetes mellitus without complications: Secondary | ICD-10-CM | POA: Diagnosis not present

## 2022-12-07 DIAGNOSIS — C20 Malignant neoplasm of rectum: Secondary | ICD-10-CM | POA: Diagnosis not present

## 2022-12-07 DIAGNOSIS — Z79899 Other long term (current) drug therapy: Secondary | ICD-10-CM | POA: Diagnosis not present

## 2022-12-07 DIAGNOSIS — C7951 Secondary malignant neoplasm of bone: Secondary | ICD-10-CM | POA: Diagnosis not present

## 2022-12-07 DIAGNOSIS — Z5111 Encounter for antineoplastic chemotherapy: Secondary | ICD-10-CM | POA: Diagnosis not present

## 2022-12-07 DIAGNOSIS — I10 Essential (primary) hypertension: Secondary | ICD-10-CM | POA: Diagnosis not present

## 2022-12-07 DIAGNOSIS — E785 Hyperlipidemia, unspecified: Secondary | ICD-10-CM | POA: Diagnosis not present

## 2022-12-07 DIAGNOSIS — D509 Iron deficiency anemia, unspecified: Secondary | ICD-10-CM | POA: Diagnosis not present

## 2022-12-07 DIAGNOSIS — Z5112 Encounter for antineoplastic immunotherapy: Secondary | ICD-10-CM | POA: Diagnosis not present

## 2022-12-07 MED ORDER — HEPARIN SOD (PORK) LOCK FLUSH 100 UNIT/ML IV SOLN
500.0000 [IU] | Freq: Once | INTRAVENOUS | Status: AC | PRN
  Administered 2022-12-07: 500 [IU]

## 2022-12-07 MED ORDER — SODIUM CHLORIDE 0.9% FLUSH
10.0000 mL | INTRAVENOUS | Status: DC | PRN
  Administered 2022-12-07: 10 mL

## 2022-12-09 ENCOUNTER — Ambulatory Visit
Admission: RE | Admit: 2022-12-09 | Discharge: 2022-12-09 | Disposition: A | Discharge status: Home / Self Care | Payer: Medicare HMO | Source: Ambulatory Visit | Attending: Radiation Oncology | Admitting: Radiation Oncology

## 2022-12-09 ENCOUNTER — Other Ambulatory Visit: Payer: Self-pay

## 2022-12-09 DIAGNOSIS — C7951 Secondary malignant neoplasm of bone: Secondary | ICD-10-CM | POA: Diagnosis not present

## 2022-12-09 DIAGNOSIS — E119 Type 2 diabetes mellitus without complications: Secondary | ICD-10-CM | POA: Diagnosis not present

## 2022-12-09 DIAGNOSIS — G893 Neoplasm related pain (acute) (chronic): Secondary | ICD-10-CM | POA: Diagnosis not present

## 2022-12-09 DIAGNOSIS — D630 Anemia in neoplastic disease: Secondary | ICD-10-CM | POA: Diagnosis not present

## 2022-12-09 DIAGNOSIS — D500 Iron deficiency anemia secondary to blood loss (chronic): Secondary | ICD-10-CM | POA: Diagnosis not present

## 2022-12-09 DIAGNOSIS — M1990 Unspecified osteoarthritis, unspecified site: Secondary | ICD-10-CM | POA: Diagnosis not present

## 2022-12-09 DIAGNOSIS — E785 Hyperlipidemia, unspecified: Secondary | ICD-10-CM | POA: Diagnosis not present

## 2022-12-09 DIAGNOSIS — Z510 Encounter for antineoplastic radiation therapy: Secondary | ICD-10-CM | POA: Diagnosis not present

## 2022-12-09 DIAGNOSIS — I10 Essential (primary) hypertension: Secondary | ICD-10-CM | POA: Diagnosis not present

## 2022-12-09 DIAGNOSIS — C20 Malignant neoplasm of rectum: Secondary | ICD-10-CM | POA: Diagnosis not present

## 2022-12-09 LAB — RAD ONC ARIA SESSION SUMMARY
Course Elapsed Days: 0
Plan Fractions Treated to Date: 1
Plan Prescribed Dose Per Fraction: 2.5 Gy
Plan Total Fractions Prescribed: 15
Plan Total Prescribed Dose: 37.5 Gy
Reference Point Dosage Given to Date: 2.5 Gy
Reference Point Session Dosage Given: 2.5 Gy
Session Number: 1

## 2022-12-10 ENCOUNTER — Ambulatory Visit
Admission: RE | Admit: 2022-12-10 | Discharge: 2022-12-10 | Disposition: A | Discharge status: Home / Self Care | Payer: Medicare HMO | Source: Ambulatory Visit | Attending: Radiation Oncology | Admitting: Radiation Oncology

## 2022-12-10 ENCOUNTER — Other Ambulatory Visit: Payer: Self-pay

## 2022-12-10 DIAGNOSIS — C20 Malignant neoplasm of rectum: Secondary | ICD-10-CM | POA: Diagnosis not present

## 2022-12-10 DIAGNOSIS — C7951 Secondary malignant neoplasm of bone: Secondary | ICD-10-CM | POA: Diagnosis not present

## 2022-12-10 DIAGNOSIS — Z510 Encounter for antineoplastic radiation therapy: Secondary | ICD-10-CM | POA: Diagnosis not present

## 2022-12-10 LAB — RAD ONC ARIA SESSION SUMMARY
Course Elapsed Days: 1
Plan Fractions Treated to Date: 2
Plan Prescribed Dose Per Fraction: 2.5 Gy
Plan Total Fractions Prescribed: 15
Plan Total Prescribed Dose: 37.5 Gy
Reference Point Dosage Given to Date: 5 Gy
Reference Point Session Dosage Given: 2.5 Gy
Session Number: 2

## 2022-12-11 ENCOUNTER — Ambulatory Visit: Payer: Medicare HMO

## 2022-12-11 ENCOUNTER — Ambulatory Visit
Admission: RE | Admit: 2022-12-11 | Discharge: 2022-12-11 | Disposition: A | Discharge status: Home / Self Care | Payer: Medicare HMO | Source: Ambulatory Visit | Attending: Radiation Oncology | Admitting: Radiation Oncology

## 2022-12-11 ENCOUNTER — Other Ambulatory Visit: Payer: Self-pay

## 2022-12-11 DIAGNOSIS — D500 Iron deficiency anemia secondary to blood loss (chronic): Secondary | ICD-10-CM | POA: Diagnosis not present

## 2022-12-11 DIAGNOSIS — Z510 Encounter for antineoplastic radiation therapy: Secondary | ICD-10-CM | POA: Diagnosis not present

## 2022-12-11 DIAGNOSIS — C20 Malignant neoplasm of rectum: Secondary | ICD-10-CM | POA: Diagnosis not present

## 2022-12-11 DIAGNOSIS — I10 Essential (primary) hypertension: Secondary | ICD-10-CM | POA: Diagnosis not present

## 2022-12-11 DIAGNOSIS — E785 Hyperlipidemia, unspecified: Secondary | ICD-10-CM | POA: Diagnosis not present

## 2022-12-11 DIAGNOSIS — C7951 Secondary malignant neoplasm of bone: Secondary | ICD-10-CM | POA: Diagnosis not present

## 2022-12-11 DIAGNOSIS — G893 Neoplasm related pain (acute) (chronic): Secondary | ICD-10-CM | POA: Diagnosis not present

## 2022-12-11 DIAGNOSIS — D630 Anemia in neoplastic disease: Secondary | ICD-10-CM | POA: Diagnosis not present

## 2022-12-11 DIAGNOSIS — M1990 Unspecified osteoarthritis, unspecified site: Secondary | ICD-10-CM | POA: Diagnosis not present

## 2022-12-11 DIAGNOSIS — E119 Type 2 diabetes mellitus without complications: Secondary | ICD-10-CM | POA: Diagnosis not present

## 2022-12-11 LAB — RAD ONC ARIA SESSION SUMMARY
Course Elapsed Days: 2
Plan Fractions Treated to Date: 3
Plan Prescribed Dose Per Fraction: 2.5 Gy
Plan Total Fractions Prescribed: 15
Plan Total Prescribed Dose: 37.5 Gy
Reference Point Dosage Given to Date: 7.5 Gy
Reference Point Session Dosage Given: 2.5 Gy
Session Number: 3

## 2022-12-12 ENCOUNTER — Other Ambulatory Visit: Payer: Self-pay

## 2022-12-12 ENCOUNTER — Ambulatory Visit
Admission: RE | Admit: 2022-12-12 | Discharge: 2022-12-12 | Disposition: A | Discharge status: Home / Self Care | Payer: Medicare HMO | Source: Ambulatory Visit | Attending: Radiation Oncology | Admitting: Radiation Oncology

## 2022-12-12 DIAGNOSIS — Z510 Encounter for antineoplastic radiation therapy: Secondary | ICD-10-CM | POA: Diagnosis not present

## 2022-12-12 DIAGNOSIS — C20 Malignant neoplasm of rectum: Secondary | ICD-10-CM | POA: Diagnosis not present

## 2022-12-12 DIAGNOSIS — C7951 Secondary malignant neoplasm of bone: Secondary | ICD-10-CM | POA: Diagnosis not present

## 2022-12-12 LAB — RAD ONC ARIA SESSION SUMMARY
Course Elapsed Days: 3
Plan Fractions Treated to Date: 4
Plan Prescribed Dose Per Fraction: 2.5 Gy
Plan Total Fractions Prescribed: 15
Plan Total Prescribed Dose: 37.5 Gy
Reference Point Dosage Given to Date: 10 Gy
Reference Point Session Dosage Given: 2.5 Gy
Session Number: 4

## 2022-12-13 ENCOUNTER — Ambulatory Visit
Admission: RE | Admit: 2022-12-13 | Discharge: 2022-12-13 | Disposition: A | Discharge status: Home / Self Care | Payer: Medicare HMO | Source: Ambulatory Visit | Attending: Radiation Oncology | Admitting: Radiation Oncology

## 2022-12-13 ENCOUNTER — Encounter: Payer: Self-pay | Admitting: Hematology and Oncology

## 2022-12-13 ENCOUNTER — Other Ambulatory Visit: Payer: Self-pay

## 2022-12-13 DIAGNOSIS — Z5112 Encounter for antineoplastic immunotherapy: Secondary | ICD-10-CM | POA: Insufficient documentation

## 2022-12-13 DIAGNOSIS — Z79899 Other long term (current) drug therapy: Secondary | ICD-10-CM | POA: Insufficient documentation

## 2022-12-13 DIAGNOSIS — C7951 Secondary malignant neoplasm of bone: Secondary | ICD-10-CM | POA: Insufficient documentation

## 2022-12-13 DIAGNOSIS — C20 Malignant neoplasm of rectum: Secondary | ICD-10-CM | POA: Insufficient documentation

## 2022-12-13 DIAGNOSIS — D509 Iron deficiency anemia, unspecified: Secondary | ICD-10-CM | POA: Insufficient documentation

## 2022-12-13 DIAGNOSIS — D75839 Thrombocytosis, unspecified: Secondary | ICD-10-CM | POA: Insufficient documentation

## 2022-12-13 DIAGNOSIS — Z510 Encounter for antineoplastic radiation therapy: Secondary | ICD-10-CM | POA: Insufficient documentation

## 2022-12-13 DIAGNOSIS — Z5111 Encounter for antineoplastic chemotherapy: Secondary | ICD-10-CM | POA: Diagnosis not present

## 2022-12-13 DIAGNOSIS — G893 Neoplasm related pain (acute) (chronic): Secondary | ICD-10-CM | POA: Insufficient documentation

## 2022-12-13 LAB — RAD ONC ARIA SESSION SUMMARY
Course Elapsed Days: 4
Plan Fractions Treated to Date: 5
Plan Prescribed Dose Per Fraction: 2.5 Gy
Plan Total Fractions Prescribed: 15
Plan Total Prescribed Dose: 37.5 Gy
Reference Point Dosage Given to Date: 12.5 Gy
Reference Point Session Dosage Given: 2.5 Gy
Session Number: 5

## 2022-12-13 MED ORDER — SONAFINE EX EMUL
1.0000 | Freq: Two times a day (BID) | CUTANEOUS | Status: DC
  Administered 2022-12-13: 1 via TOPICAL

## 2022-12-16 ENCOUNTER — Other Ambulatory Visit: Payer: Self-pay

## 2022-12-16 ENCOUNTER — Ambulatory Visit
Admission: RE | Admit: 2022-12-16 | Discharge: 2022-12-16 | Disposition: A | Discharge status: Home / Self Care | Payer: Medicare HMO | Source: Ambulatory Visit | Attending: Radiation Oncology | Admitting: Radiation Oncology

## 2022-12-16 DIAGNOSIS — E785 Hyperlipidemia, unspecified: Secondary | ICD-10-CM | POA: Diagnosis not present

## 2022-12-16 DIAGNOSIS — E119 Type 2 diabetes mellitus without complications: Secondary | ICD-10-CM | POA: Diagnosis not present

## 2022-12-16 DIAGNOSIS — D630 Anemia in neoplastic disease: Secondary | ICD-10-CM | POA: Diagnosis not present

## 2022-12-16 DIAGNOSIS — I10 Essential (primary) hypertension: Secondary | ICD-10-CM | POA: Diagnosis not present

## 2022-12-16 DIAGNOSIS — Z5112 Encounter for antineoplastic immunotherapy: Secondary | ICD-10-CM | POA: Diagnosis not present

## 2022-12-16 DIAGNOSIS — Z5111 Encounter for antineoplastic chemotherapy: Secondary | ICD-10-CM | POA: Diagnosis not present

## 2022-12-16 DIAGNOSIS — G893 Neoplasm related pain (acute) (chronic): Secondary | ICD-10-CM | POA: Diagnosis not present

## 2022-12-16 DIAGNOSIS — M1990 Unspecified osteoarthritis, unspecified site: Secondary | ICD-10-CM | POA: Diagnosis not present

## 2022-12-16 DIAGNOSIS — Z510 Encounter for antineoplastic radiation therapy: Secondary | ICD-10-CM | POA: Diagnosis not present

## 2022-12-16 DIAGNOSIS — D509 Iron deficiency anemia, unspecified: Secondary | ICD-10-CM | POA: Diagnosis not present

## 2022-12-16 DIAGNOSIS — D500 Iron deficiency anemia secondary to blood loss (chronic): Secondary | ICD-10-CM | POA: Diagnosis not present

## 2022-12-16 DIAGNOSIS — C20 Malignant neoplasm of rectum: Secondary | ICD-10-CM | POA: Diagnosis not present

## 2022-12-16 DIAGNOSIS — Z79899 Other long term (current) drug therapy: Secondary | ICD-10-CM | POA: Diagnosis not present

## 2022-12-16 DIAGNOSIS — D75839 Thrombocytosis, unspecified: Secondary | ICD-10-CM | POA: Diagnosis not present

## 2022-12-16 DIAGNOSIS — C7951 Secondary malignant neoplasm of bone: Secondary | ICD-10-CM | POA: Diagnosis not present

## 2022-12-16 LAB — RAD ONC ARIA SESSION SUMMARY
Course Elapsed Days: 7
Plan Fractions Treated to Date: 6
Plan Prescribed Dose Per Fraction: 2.5 Gy
Plan Total Fractions Prescribed: 15
Plan Total Prescribed Dose: 37.5 Gy
Reference Point Dosage Given to Date: 15 Gy
Reference Point Session Dosage Given: 2.5 Gy
Session Number: 6

## 2022-12-16 NOTE — Progress Notes (Addendum)
Palliative Medicine [facility]  Telephone:([phone]) [phone] Fax:([phone]) [phone]   Name: DILEN GIANGRASSO Date: 12/16/2022 MRN: [id number]  DOB: 07/13/1960  Patient Care Team: [name], MD as PCP - General (Hematology and Oncology) Doreatha Massed, MD as Medical Oncologist (Medical Oncology) Pickenpack-Cousar, Arty Baumgartner, NP as Nurse Practitioner (Hospice and Palliative Medicine)    INTERVAL HISTORY: Joseph Hogan is a 62 y.o. 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 Palliative ask to see for symptom management and goals of care.   SOCIAL HISTORY:     reports that he has never smoked. He has never used smokeless tobacco. He reports that he does not drink alcohol and does not use drugs.  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  MEDICATIONS:  Current Outpatient Medications  Medication Sig Dispense Refill   clindamycin (CLEOCIN-T) 1 % lotion Apply to rash as needed. (Patient not taking: Reported on 11/21/2022) 60 mL 0   cyanocobalamin (VITAMIN B12) 1000 MCG tablet Take 1 tablet (1,000 mcg total) by mouth daily. 30 tablet 1   doxycycline (VIBRA-TABS) 100 MG tablet Take 1 tablet (100 mg total) by mouth 2 (two) times daily. 60 tablet 3   DULoxetine (CYMBALTA) 30 MG capsule Take 1 capsule (30 mg total) by mouth daily. (Patient not taking: Reported on 12/05/2022) 30 capsule 0   gabapentin (NEURONTIN) 300 MG  capsule Take 1 capsule (300 mg total) by mouth at bedtime. (Patient not taking: Reported on 07/31/2022) 30 capsule 1   lidocaine-prilocaine (EMLA) cream Apply a quarter-sized amount to port a cath site and cover with plastic wrap one hour prior to infusion appointments (Patient not taking: Reported on 09/11/2022) 30 g 3   magnesium oxide (MAG-OX) 400 (240 Mg) MG tablet Take 1 tablet (400 mg total) by mouth daily. 30 tablet 0   ondansetron (ZOFRAN) 8 MG tablet Take 2 tablets (16 mg total) by mouth every 8 (eight) hours as needed for nausea or vomiting. (Patient not taking: Reported on 08/14/2022) 20 tablet 3   oxyCODONE ER (XTAMPZA ER) 36 MG C12A Take 1 capsule (36 mg total) by mouth every 12 (twelve) hours. 60 capsule 0   Oxycodone HCl 10 MG TABS Take 1 tablet (10 mg total) by mouth every 6 (six) hours as needed. 60 tablet 0   pantoprazole (PROTONIX) 20 MG tablet Take 1 tablet (20 mg total) by mouth daily. 30 tablet 0   polyethylene glycol (MIRALAX / GLYCOLAX) 17 g packet Take 17 g by mouth daily as needed for mild constipation. 14 each 0   potassium chloride (KLOR-CON M) 10 MEQ tablet Take 1 tablet (10 mEq total) by mouth daily. 30 tablet 0   senna-docusate (SENOKOT-S) 8.6-50 MG tablet Take 1 tablet by mouth 2 (two) times daily. 30 tablet 0   No current facility-administered medications for this visit.    VITAL SIGNS: There were no vitals taken for this visit. There were no  vitals filed for this visit.  Estimated body mass index is 26.71 kg/m as calculated from the following:   Height as of 11/27/22: 5\' 11"  (1.803 m).   Weight as of 12/05/22: 191 lb 8 oz (86.9 kg).   PERFORMANCE STATUS (ECOG) : 2 - Symptomatic, <50% confined to bed  Discussed the use of AI scribe software for clinical note transcription with the patient, who gave verbal consent to proceed.   IMPRESSION:  Joseph Hogan presents to clinic for symptom management follow-up. No acute distress. His son is present. Patient is in a  wheelchair. Skin continues to be dry. He reports no pain and describes the sensation in his right hip as an occasional 'tingle.' He has been managing his medications well, with a noted decrease in his supply of a 12-hour medication, which will be refilled. He is much appreciative in his improvement in pain.  Zylan has his pain medication bottles with him today. All medications reviewed. He is taking as prescribed. He has his oxycodone 10mg  breakthrough medication which he has used sparingly when needed.  Reports sleeping well, typically from 10pm to around 9 or 10am. He experienced a bout of diarrhea lasting four to five days, which has since resolved. During this period, he took two painkillers, which seemed to help stop the diarrhea. His appetite has been fluctuating, typically decreasing around the time of his treatments, but it has been improving in the last two days. We discussed the need to increase his appetite. Current weight is 180lbs down from 191lbs on 10/24, 209lbs on 10/16. We will connect him with a dietician for additional support.   We reviewed patient's scheduled for regular treatments and radiation, with a long treatment day including a doctor's appointment coming up. He has a pump that is typically removed on Fridays. Son verbalized understanding of calendar of events.  Goals of Care  11/07/22- We discussed his current illness and what it means in the larger context of his on-going co-morbidities. Natural disease trajectory and expectations were discussed.   Patient's son verbalized understanding of his father's current illness. Patient also able to acknowledge some form of understanding but with some obvious signs of confusion when it comes to memory recall. He mentions his children assist with most of his medical needs.    I empathetically approached discussions regarding healthcare limitations including code status and advanced directives. Son, Joseph Hogan states they are in the process of  completing documents. Joseph Hogan (patient) confirms. They verbalize patient's [name] is primary medical decision maker. They request patient to remain full code with full scope care. Mr. Treu and son are clear in expressed wishes to continue to treat the treatable allowing patient every opportunity to continue to do as good as he can for as long as he can while managing his symptoms.     We discussed Her current illness and what it means in the larger context of Her on-going co-morbidities. Natural disease trajectory and expectations were discussed.  I discussed the importance of continued conversation with family and their medical providers regarding overall plan of care and treatment options, ensuring decisions are within the context of the patients values and GOCs.  PLAN:  Pain Management Pain well controlled with current regimen. Patient has been taking Oxycodone 10mg  as needed for breakthrough pain.  -Continue Oxycodone 10mg  as needed for pain. Marlowe Kays 36mg  every 12 hours.  -His daughter manages weekly pill container   Medication Refill Patient's medication is running low. -Send refill to local  pharmacy.  Gastrointestinal Issues Patient experienced diarrhea for several days, which has now resolved. -Monitor symptoms.  Appetite Decreased appetite post-treatment, improving as next treatment approaches. -Encourage patient to continue working on improving appetite. -Weight significantly decreased over the past month.  -Dietician referral   Cancer Treatment Patient is undergoing radiation therapy and has chemotherapy scheduled. -Continue with current treatment plan. -Next chemotherapy session scheduled for 12/18/2022.  Follow-up Patient to return for treatment and doctor's appointment on 12/18/2022. -Will follow-up in clinic on 12/26/2022. Patient expressed understanding and was in agreement with this plan. He also understands that He can call the clinic at any time  with any questions, concerns, or complaints.   Any controlled substances utilized were prescribed in the context of palliative care. PDMP has been reviewed.   Visit consisted of counseling and education dealing with the complex and emotionally intense issues of symptom management and palliative care in the setting of serious and potentially life-threatening illness.  Willette Alma, AGPCNP-BC  Palliative Medicine Team/[facility]  *Please note that this is a verbal dictation therefore any spelling or grammatical errors are due to the "Dragon Medical One" system interpretation.

## 2022-12-17 ENCOUNTER — Encounter: Payer: Self-pay | Admitting: Nurse Practitioner

## 2022-12-17 ENCOUNTER — Inpatient Hospital Stay: Payer: Medicare HMO | Attending: Radiation Oncology | Admitting: Nurse Practitioner

## 2022-12-17 ENCOUNTER — Ambulatory Visit
Admission: RE | Admit: 2022-12-17 | Discharge: 2022-12-17 | Disposition: A | Discharge status: Home / Self Care | Payer: Medicare HMO | Source: Ambulatory Visit | Attending: Radiation Oncology

## 2022-12-17 ENCOUNTER — Other Ambulatory Visit (HOSPITAL_COMMUNITY): Payer: Self-pay

## 2022-12-17 ENCOUNTER — Other Ambulatory Visit: Payer: Self-pay

## 2022-12-17 VITALS — BP 128/83 | HR 99 | Temp 98.2°F | Resp 17 | Wt 180.9 lb

## 2022-12-17 DIAGNOSIS — Z515 Encounter for palliative care: Secondary | ICD-10-CM

## 2022-12-17 DIAGNOSIS — Z510 Encounter for antineoplastic radiation therapy: Secondary | ICD-10-CM | POA: Diagnosis not present

## 2022-12-17 DIAGNOSIS — G893 Neoplasm related pain (acute) (chronic): Secondary | ICD-10-CM | POA: Diagnosis not present

## 2022-12-17 DIAGNOSIS — C20 Malignant neoplasm of rectum: Secondary | ICD-10-CM | POA: Diagnosis not present

## 2022-12-17 DIAGNOSIS — C7951 Secondary malignant neoplasm of bone: Secondary | ICD-10-CM | POA: Insufficient documentation

## 2022-12-17 DIAGNOSIS — Z5111 Encounter for antineoplastic chemotherapy: Secondary | ICD-10-CM | POA: Insufficient documentation

## 2022-12-17 DIAGNOSIS — D75839 Thrombocytosis, unspecified: Secondary | ICD-10-CM | POA: Diagnosis not present

## 2022-12-17 DIAGNOSIS — Z5112 Encounter for antineoplastic immunotherapy: Secondary | ICD-10-CM | POA: Insufficient documentation

## 2022-12-17 DIAGNOSIS — D509 Iron deficiency anemia, unspecified: Secondary | ICD-10-CM | POA: Insufficient documentation

## 2022-12-17 DIAGNOSIS — Z79899 Other long term (current) drug therapy: Secondary | ICD-10-CM | POA: Insufficient documentation

## 2022-12-17 DIAGNOSIS — R530 Neoplastic (malignant) related fatigue: Secondary | ICD-10-CM | POA: Diagnosis not present

## 2022-12-17 LAB — RAD ONC ARIA SESSION SUMMARY
Course Elapsed Days: 8
Plan Fractions Treated to Date: 7
Plan Prescribed Dose Per Fraction: 2.5 Gy
Plan Total Fractions Prescribed: 15
Plan Total Prescribed Dose: 37.5 Gy
Reference Point Dosage Given to Date: 17.5 Gy
Reference Point Session Dosage Given: 2.5 Gy
Session Number: 7

## 2022-12-17 MED ORDER — XTAMPZA ER 36 MG PO C12A
36.0000 mg | EXTENDED_RELEASE_CAPSULE | Freq: Two times a day (BID) | ORAL | 0 refills | Status: DC
  Filled 2022-12-17 – 2022-12-25 (×6): qty 60, 30d supply, fill #0

## 2022-12-17 MED ORDER — OXYCODONE HCL 10 MG PO TABS
10.0000 mg | ORAL_TABLET | Freq: Four times a day (QID) | ORAL | 0 refills | Status: DC | PRN
  Filled 2022-12-20: qty 60, 15d supply, fill #0

## 2022-12-18 ENCOUNTER — Other Ambulatory Visit: Payer: Self-pay | Admitting: Hematology and Oncology

## 2022-12-18 ENCOUNTER — Other Ambulatory Visit: Payer: Self-pay

## 2022-12-18 ENCOUNTER — Inpatient Hospital Stay: Payer: Medicare HMO

## 2022-12-18 ENCOUNTER — Ambulatory Visit
Admission: RE | Admit: 2022-12-18 | Discharge: 2022-12-18 | Disposition: A | Discharge status: Home / Self Care | Payer: Medicare HMO | Source: Ambulatory Visit | Attending: Radiation Oncology | Admitting: Radiation Oncology

## 2022-12-18 ENCOUNTER — Inpatient Hospital Stay: Payer: Medicare HMO | Admitting: Hematology and Oncology

## 2022-12-18 VITALS — BP 115/74 | HR 82 | Temp 98.3°F | Resp 15 | Wt 180.0 lb

## 2022-12-18 DIAGNOSIS — Z95828 Presence of other vascular implants and grafts: Secondary | ICD-10-CM | POA: Diagnosis not present

## 2022-12-18 DIAGNOSIS — R530 Neoplastic (malignant) related fatigue: Secondary | ICD-10-CM

## 2022-12-18 DIAGNOSIS — Z5111 Encounter for antineoplastic chemotherapy: Secondary | ICD-10-CM | POA: Diagnosis not present

## 2022-12-18 DIAGNOSIS — D509 Iron deficiency anemia, unspecified: Secondary | ICD-10-CM | POA: Diagnosis not present

## 2022-12-18 DIAGNOSIS — C20 Malignant neoplasm of rectum: Secondary | ICD-10-CM

## 2022-12-18 DIAGNOSIS — C7951 Secondary malignant neoplasm of bone: Secondary | ICD-10-CM | POA: Diagnosis not present

## 2022-12-18 DIAGNOSIS — G893 Neoplasm related pain (acute) (chronic): Secondary | ICD-10-CM

## 2022-12-18 DIAGNOSIS — D75839 Thrombocytosis, unspecified: Secondary | ICD-10-CM | POA: Diagnosis not present

## 2022-12-18 DIAGNOSIS — Z510 Encounter for antineoplastic radiation therapy: Secondary | ICD-10-CM | POA: Diagnosis not present

## 2022-12-18 DIAGNOSIS — Z79899 Other long term (current) drug therapy: Secondary | ICD-10-CM | POA: Diagnosis not present

## 2022-12-18 DIAGNOSIS — Z5112 Encounter for antineoplastic immunotherapy: Secondary | ICD-10-CM | POA: Diagnosis not present

## 2022-12-18 LAB — CBC WITH DIFFERENTIAL (CANCER CENTER ONLY)
Abs Immature Granulocytes: 0.02 10*3/uL (ref 0.00–0.07)
Basophils Absolute: 0 10*3/uL (ref 0.0–0.1)
Basophils Relative: 0 %
Eosinophils Absolute: 0.2 10*3/uL (ref 0.0–0.5)
Eosinophils Relative: 7 %
HCT: 27.5 % — ABNORMAL LOW (ref 39.0–52.0)
Hemoglobin: 8.7 g/dL — ABNORMAL LOW (ref 13.0–17.0)
Immature Granulocytes: 1 %
Lymphocytes Relative: 9 %
Lymphs Abs: 0.3 10*3/uL — ABNORMAL LOW (ref 0.7–4.0)
MCH: 28.7 pg (ref 26.0–34.0)
MCHC: 31.6 g/dL (ref 30.0–36.0)
MCV: 90.8 fL (ref 80.0–100.0)
Monocytes Absolute: 0.4 10*3/uL (ref 0.1–1.0)
Monocytes Relative: 12 %
Neutro Abs: 2.4 10*3/uL (ref 1.7–7.7)
Neutrophils Relative %: 71 %
Platelet Count: 292 10*3/uL (ref 150–400)
RBC: 3.03 MIL/uL — ABNORMAL LOW (ref 4.22–5.81)
RDW: 19 % — ABNORMAL HIGH (ref 11.5–15.5)
WBC Count: 3.3 10*3/uL — ABNORMAL LOW (ref 4.0–10.5)
nRBC: 0 % (ref 0.0–0.2)

## 2022-12-18 LAB — RAD ONC ARIA SESSION SUMMARY
Course Elapsed Days: 9
Plan Fractions Treated to Date: 8
Plan Prescribed Dose Per Fraction: 2.5 Gy
Plan Total Fractions Prescribed: 15
Plan Total Prescribed Dose: 37.5 Gy
Reference Point Dosage Given to Date: 20 Gy
Reference Point Session Dosage Given: 2.5 Gy
Session Number: 8

## 2022-12-18 LAB — CMP (CANCER CENTER ONLY)
ALT: 6 U/L (ref 0–44)
AST: 13 U/L — ABNORMAL LOW (ref 15–41)
Albumin: 3.3 g/dL — ABNORMAL LOW (ref 3.5–5.0)
Alkaline Phosphatase: 171 U/L — ABNORMAL HIGH (ref 38–126)
Anion gap: 8 (ref 5–15)
BUN: 8 mg/dL (ref 8–23)
CO2: 26 mmol/L (ref 22–32)
Calcium: 8.5 mg/dL — ABNORMAL LOW (ref 8.9–10.3)
Chloride: 103 mmol/L (ref 98–111)
Creatinine: 0.54 mg/dL — ABNORMAL LOW (ref 0.61–1.24)
GFR, Estimated: >60 mL/min (ref >60)
Glucose, Bld: 111 mg/dL — ABNORMAL HIGH (ref 70–99)
Potassium: 3.6 mmol/L (ref 3.5–5.1)
Sodium: 137 mmol/L (ref 135–145)
Total Bilirubin: 0.9 mg/dL (ref <1.2)
Total Protein: 6 g/dL — ABNORMAL LOW (ref 6.5–8.1)

## 2022-12-18 LAB — MAGNESIUM: Magnesium: 1.8 mg/dL (ref 1.7–2.4)

## 2022-12-18 MED ORDER — PALONOSETRON HCL INJECTION 0.25 MG/5ML
0.2500 mg | Freq: Once | INTRAVENOUS | Status: AC
  Administered 2022-12-18: 0.25 mg via INTRAVENOUS
  Filled 2022-12-18: qty 5

## 2022-12-18 MED ORDER — IRINOTECAN HCL CHEMO INJECTION 100 MG/5ML
180.0000 mg/m2 | Freq: Once | INTRAVENOUS | Status: AC
  Administered 2022-12-18: 400 mg via INTRAVENOUS
  Filled 2022-12-18: qty 15

## 2022-12-18 MED ORDER — SODIUM CHLORIDE 0.9 % IV SOLN
4.0000 mg/kg | Freq: Once | INTRAVENOUS | Status: AC
  Administered 2022-12-18: 360 mg via INTRAVENOUS
  Filled 2022-12-18: qty 18

## 2022-12-18 MED ORDER — SODIUM CHLORIDE 0.9% FLUSH
10.0000 mL | Freq: Once | INTRAVENOUS | Status: AC
  Administered 2022-12-18: 10 mL

## 2022-12-18 MED ORDER — FLUOROURACIL CHEMO INJECTION 2.5 GM/50ML
400.0000 mg/m2 | Freq: Once | INTRAVENOUS | Status: AC
Start: 2022-12-18 — End: 2022-12-18
  Administered 2022-12-18: 850 mg via INTRAVENOUS
  Filled 2022-12-18: qty 17

## 2022-12-18 MED ORDER — SODIUM CHLORIDE 0.9 % IV SOLN: Freq: Once | INTRAVENOUS | Status: AC

## 2022-12-18 MED ORDER — LEUCOVORIN CALCIUM INJECTION 350 MG
400.0000 mg/m2 | Freq: Once | INTRAMUSCULAR | Status: AC
  Administered 2022-12-18: 848 mg via INTRAVENOUS
  Filled 2022-12-18: qty 42.4

## 2022-12-18 MED ORDER — DEXAMETHASONE SODIUM PHOSPHATE 10 MG/ML IJ SOLN
10.0000 mg | Freq: Once | INTRAMUSCULAR | Status: AC
Start: 2022-12-18 — End: 2022-12-18
  Administered 2022-12-18: 10 mg via INTRAVENOUS
  Filled 2022-12-18: qty 1

## 2022-12-18 MED ORDER — SODIUM CHLORIDE 0.9 % IV SOLN
2400.0000 mg/m2 | INTRAVENOUS | Status: DC
  Administered 2022-12-18: 5000 mg via INTRAVENOUS
  Filled 2022-12-18: qty 100

## 2022-12-18 NOTE — Progress Notes (Signed)
[facility] [phone]   [phone]  PROGRESS NOTE  Patient Care Team: Joseph Standard, MD as PCP - General (Hematology and Oncology) Joseph Massed, MD as Medical Oncologist (Medical Oncology) Pickenpack-Cousar, Arty Baumgartner, NP as Nurse Practitioner (Hospice and Palliative Medicine)  DIAGNOSIS: Metastatic rectal carcinoma  ONCOLOGIC HISTORY: # Metastatic Rectal Adenocarcinoma 02/03/2022-02/11/2022: Presented to ED due to left hip pain after a mechanical fall.  02/04/2022: CT left XBM:WUXLK pathologic fracture of the left femoral neck with displacement and angulation, with underlying lytic lesions in the femoral head and neck.Additional lytic destructive lesion of the left parasymphyseal pubic bone extending throughout the superior pubic ramus to the puboacetabular junction, with pathologic fracture of the superior pubic ramus. Adjacent hypoattenuation in the pelvic component of the obturator internus muscle, suspicious for tumor involvement. Probable small lytic lesion in the inferior pubic ramus.Prominent left external iliac lymph nodes measuring up to 1.2 cm. 02/05/2022: CT CAP: Pathologic retroperitoneal and bilateral pelvic adenopathy, with lytic destructive lesions of the left superior and inferior pubic ramus and pubic body as well as a pathologic fracture through the left femoral neck. Pathologic fracture anteriorly in the right second rib. Deformity anteriorly in the right third rib probably from early pathologic fracture. Metastatic disease or myeloma strongly favored over multifocal osteomyelitis as a cause. Mildly enlarged left supraclavicular lymph node and lower thoracic periaortic lymph node. Pathologic retroperitoneal and pelvic adenopathy. 10 cm long segment of rectal wall thickening.1.1 cm exophytic lesion from the left mid kidney posterolaterally, nonspecific for complex cyst versus tumor. Bandlike atelectasis in both lungs.Small type 1  hiatal hernia.Lumbar spondylosis and degenerative disc disease causing generally mild multilevel impingement. Sigmoid colon diverticulosis.Wall thickening in the proximal stomach body, probably secondary to nondistention. 02/06/2022: Underwent left total hip arthroplasty. Pathology revealed poorly differentiated carcinoma.  SPEP/IFE did not detect monoclonal protein. PSA normal. 02/25/2022: Underwent colonoscopy that showed malignant tumor in the rectum. Pathology confirmed poorly differentiated carcinoma.  04/05/2022: Underwent port placement 04/08/2022: Cycle 1, Day 1 of FOLFOX (held bevacizumab due to recent port placement) 04/23/2022: Cycle 2, Day 1 of FOLFOX plus bevacizumab 05/07/2022: Cycle 3, Day 1 of FOLFOX plus bevacizumab 05/21/2022: Cycle 4, Day 1 of FOLFOX plus bevacizumab. Transitioned care to Joseph Hogan at [facility] 07/02/2022: Cycle 6, Day 1 of FOLFOX plus bevacizumab at [facility].  07/11/2022: NM PET CT scan showed progression of hypermetabolic osseous metastatic disease, although there is some mixed change as detailed. New hypermetabolic right femoral neck metastasis places the patient at risk for pathologic right femoral neck fracture. 07/30/2022:  Cycle 1 Day 1  FOLFIRI +Panitumumab 08/14/2022: Cycle 2 Day 1 FOLFIRI + Panitumumab 08/27/2022: Cycle 3 Day 1 FOLFIRI + Panitumumab 09/11/2022: Cycle 4 Day 1 FOLFIRI (Hold Panitumumab due to acneiform rash) 10/02/2022: Cycle 5 Day 1 FOLFIRI (Hold Panitumumab due to acneiform rash) 11/07/2022: Cycle 6 Day 1 FOLFIRI plus Panitumumab dose reduced at 4 mg/kg.  11/21/2022: Cycle 7 Day 1 FOLFIRI plus Panitumumab dose reduced at 4 mg/kg.  12/05/2022: Cycle 8 Day 1 FOLFIRI plus Panitumumab dose reduced at 4 mg/kg.   HISTORY OF PRESENTING ILLNESS:  Joseph Hogan 62 y.o. male returns for follow-up for metastatic rectal cancer. He presents today to start Cycle 9, Day 1 today. He is unaccompanied for this visit.   On exam today, Joseph Hogan reports he  has been feeling tired lately.  He reports he stayed up late to watch the election returns.  He notes his appetite has been good  and he is drinking plenty of water.  He reports that he is mostly struggling with fatigue and also had diarrhea for about 4 to 5 days after his last treatment.  He reports it is back to soft and solid now.  He does that he has not been taking anything to slow down his bowel movements and we recommended Imodium OTC today.  He reports that his hip and his butt are sore but he has been prescribed foam cushions to help with this.  He reports he is also doing his best to try to eat more protein.  He has a rash that is quite mild and not bothering him at all.  He reports he is taking his long-acting oxycodone and the as needed medication only about once a day, and not every day.  Overall he is willing and able to proceed with chemotherapy at this time.  He denies any fevers, chills, sweats, shortness of breath, chest pain or cough.  A full 10 point ROS is otherwise negative.  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   with abnormal R wave progression noted on EKG   MRSA (methicillin resistant Staphylococcus aureus)    Pre-diabetes    Rectal carcinoma (HCC) 03/19/2022    SURGICAL HISTORY: Past Surgical History:  Procedure Laterality Date   COLONOSCOPY     HUMERUS IM NAIL Right 04/12/2022   Procedure: INTRAMEDULLARY (IM) NAIL HUMERAL;  Surgeon: Bjorn Pippin, MD;  Location: [facility];  Service: Orthopedics;  Laterality: Right;   IR IMAGING GUIDED PORT INSERTION  04/05/2022   TOTAL HIP ARTHROPLASTY Left 02/06/2022   Procedure: TOTAL HIP ARTHROPLASTY;  Surgeon: Joen Laura, MD;  Location: [facility];  Service: Orthopedics;  Laterality: Left;    SOCIAL HISTORY: Social History   Socioeconomic History   Marital status: Divorced    Spouse name: Not on file    Number of children: Not on file   Years of education: Not on file   Highest education level: Not on file  Occupational History   Not on file  Tobacco Use   Smoking status: Never   Smokeless tobacco: Never  Vaping Use   Vaping status: Never Used  Substance and Sexual Activity   Alcohol use: No   Drug use: No   Sexual activity: Not Currently  Other Topics Concern   Not on file  Social History Narrative   Not on file   Social Determinants of Health   Financial Resource Strain: Not on file  Food Insecurity: No Food Insecurity (10/15/2022)   Hunger Vital Sign    Worried About Running Out of Food in the Last Year: Never true    Ran Out of Food in the Last Year: Never true  Transportation Needs: No Transportation Needs (10/15/2022)   PRAPARE - Administrator, Civil Service (Medical): No    Lack of Transportation (Non-Medical): No  Physical Activity: Not on file  Stress: Not on file  Social Connections: Not on file  Intimate Partner Violence: Not At Risk (10/15/2022)   Humiliation, Afraid, Rape, and Kick questionnaire    Fear of Current or Ex-Partner: No    Emotionally Abused: No    Physically Abused: No    Sexually Abused: No    FAMILY HISTORY: Family History  Problem Relation Age of Onset   Stroke Mother  Hypertension Mother    Heart disease Father    Alzheimer's disease Father    Hypertension Brother    Alcohol abuse Brother    Cancer Maternal Aunt    Heart disease Maternal Aunt    Cancer Maternal Uncle    Heart disease Maternal Uncle    Cancer Paternal Aunt    Heart disease Paternal Aunt    Cancer Paternal Uncle    Heart disease Paternal Uncle    Heart disease Paternal Grandmother    Alzheimer's disease Paternal Grandfather    Stomach cancer Neg Hx    Rectal cancer Neg Hx    Esophageal cancer Neg Hx    Colon cancer Neg Hx     ALLERGIES:  is allergic to codeine, dilaudid [hydromorphone hcl], and lisinopril.  MEDICATIONS:  Current Outpatient  Medications  Medication Sig Dispense Refill   clindamycin (CLEOCIN-T) 1 % lotion Apply to rash as needed. (Patient not taking: Reported on 11/21/2022) 60 mL 0   cyanocobalamin (VITAMIN B12) 1000 MCG tablet Take 1 tablet (1,000 mcg total) by mouth daily. 30 tablet 1   doxycycline (VIBRA-TABS) 100 MG tablet Take 1 tablet (100 mg total) by mouth 2 (two) times daily. 60 tablet 3   DULoxetine (CYMBALTA) 30 MG capsule Take 1 capsule (30 mg total) by mouth daily. (Patient not taking: Reported on 12/05/2022) 30 capsule 0   gabapentin (NEURONTIN) 300 MG capsule Take 1 capsule (300 mg total) by mouth at bedtime. (Patient not taking: Reported on 07/31/2022) 30 capsule 1   lidocaine-prilocaine (EMLA) cream Apply a quarter-sized amount to port a cath site and cover with plastic wrap one hour prior to infusion appointments (Patient not taking: Reported on 09/11/2022) 30 g 3   magnesium oxide (MAG-OX) 400 (240 Mg) MG tablet Take 1 tablet (400 mg total) by mouth daily. 30 tablet 0   ondansetron (ZOFRAN) 8 MG tablet Take 2 tablets (16 mg total) by mouth every 8 (eight) hours as needed for nausea or vomiting. (Patient not taking: Reported on 08/14/2022) 20 tablet 3   oxyCODONE ER (XTAMPZA ER) 36 MG C12A Take 1 capsule (36 mg total) by mouth every 12 (twelve) hours. 60 capsule 0   [START ON 12/20/2022] Oxycodone HCl 10 MG TABS Take 1 tablet (10 mg total) by mouth every 6 (six) hours as needed. 60 tablet 0   pantoprazole (PROTONIX) 20 MG tablet Take 1 tablet (20 mg total) by mouth daily. 30 tablet 0   polyethylene glycol (MIRALAX / GLYCOLAX) 17 g packet Take 17 g by mouth daily as needed for mild constipation. 14 each 0   potassium chloride (KLOR-CON M) 10 MEQ tablet Take 1 tablet (10 mEq total) by mouth daily. 30 tablet 0   senna-docusate (SENOKOT-S) 8.6-50 MG tablet Take 1 tablet by mouth 2 (two) times daily. 30 tablet 0   No current facility-administered medications for this visit.    REVIEW OF SYSTEMS:    Constitutional: ( - ) fevers, ( - )  chills , ( - ) night sweats Eyes: ( - ) blurriness of vision, ( - ) double vision, ( - ) watery eyes Ears, nose, mouth, throat, and face: ( - ) mucositis, ( - ) sore throat Respiratory: ( - ) cough, ( - ) dyspnea, ( - ) wheezes Cardiovascular: ( - ) palpitation, ( - ) chest discomfort, ( - ) lower extremity swelling Gastrointestinal:  ( - ) nausea, ( - ) heartburn, ( - ) change in bowel habits Skin: ( + )  abnormal skin rashes Lymphatics: ( - ) new lymphadenopathy, ( - ) easy bruising Neurological: ( - ) numbness, ( - ) tingling, ( - ) new weaknesses Behavioral/Psych: ( - ) mood change, ( - ) new changes  All other systems were reviewed with the patient and are negative.  PHYSICAL EXAMINATION: ECOG PERFORMANCE STATUS: 2 - Symptomatic, <50% confined to bed  There were no vitals filed for this visit.    There were no vitals filed for this visit.     GENERAL: well appearing male in NAD. Exam performed in wheelchair.  SKIN: skin color, texture, turgor are normal,or significant lesions.   EYES: conjunctiva are pink and non-injected, sclera clear LUNGS: clear to auscultation and percussion with normal breathing effort HEART: regular rate & rhythm and no murmurs and no lower extremity edema Musculoskeletal: no cyanosis of digits and no clubbing  PSYCH: alert & oriented x 3, fluent speech NEURO: no focal motor/sensory deficits  LABORATORY DATA:  I have reviewed the data as listed    Latest Ref Rng & Units 12/05/2022    9:41 AM 11/21/2022    7:43 AM 11/07/2022    7:44 AM  CBC  WBC 4.0 - 10.5 K/uL 3.8  4.0  10.8   Hemoglobin 13.0 - 17.0 g/dL 8.7  9.3  9.2   Hematocrit 39.0 - 52.0 % 28.4  30.1  29.4   Platelets 150 - 400 K/uL 369  286  571        Latest Ref Rng & Units 12/05/2022    9:41 AM 11/21/2022    7:43 AM 11/07/2022    7:44 AM  CMP  Glucose 70 - 99 mg/dL 440  347  425   BUN 8 - 23 mg/dL 9  8  15    Creatinine 0.61 - 1.24 mg/dL 9.56   3.87  5.64   Sodium 135 - 145 mmol/L 139  137  134   Potassium 3.5 - 5.1 mmol/L 3.8  3.4  4.3   Chloride 98 - 111 mmol/L 105  102  96   CO2 22 - 32 mmol/L 26  26  28    Calcium 8.9 - 10.3 mg/dL 8.9  8.9  9.1   Total Protein 6.5 - 8.1 g/dL 6.1  6.7  7.2   Total Bilirubin 0.3 - 1.2 mg/dL 0.7  0.9  0.7   Alkaline Phos 38 - 126 U/L 291  422  232   AST 15 - 41 U/L 13  24  39   ALT 0 - 44 U/L 5  7  21       PATHOLOGY: Rectum, biopsy POORLY DIFFERENTIATED CARCINOMA. SEE NOTE. Diagnosis Note Addendum: Immunohistochemistry shows the carcinoma is positive with MOC-31, cytokeratin 20 and CDX2. Tumor is negative with cytokeratin 7, cytokeratin 5/6, p40, TTF-1, Napsin A, prostate-specific antigen and prostein. The immunophenotype is consistent with primary colorectal adenocarcinoma.  RADIOGRAPHIC STUDIES: DG Hip Unilat W or W/O Pelvis 1 View Right  Result Date: 11/21/2022 CLINICAL DATA:  Rectal cancer with bone metastasis, right ischium. Right hip pain. Rule out fracture. EXAM: DG HIP (WITH OR WITHOUT PELVIS) 1V RIGHT; RIGHT FEMUR 1 VIEW COMPARISON:  CT 10/14/2022 FINDINGS: Pelvis and right hip: Sclerosis about the left iliac bone corresponds to sclerotic bone lesion on recent CT. Additional areas of bony sclerosis involving the sacrum are not as well demonstrated. Expansile lesion involving the left superior pubic ramus is again seen. Ill-defined areas of sclerosis involving the right posterior acetabulum and ischium corresponding to sclerotic metastatic  disease. There is no radiographic evidence of fracture. The femoral head is well seated, no hip dislocation. Left hip arthroplasty is intact were visualized. Right femur: Linear area of sclerosis in the distal femoral shaft is of unknown significance. There is no cortical thinning. Moderate osteoarthritis of the knee. No femur fracture. IMPRESSION: 1. No radiographic evidence of fracture of the right hip or right femur. Particularly, no evidence of  right acetabular fracture. If clinically indicated, MRI is more sensitive for assessment of pathologic fracture. 2. Multifocal sclerotic bone lesions consistent with known metastatic disease. 3. Linear sclerosis involving the distal femoral shaft may represent a sclerotic lesion, but is nonspecific. There is no cortical thinning. Electronically Signed   By: Narda Rutherford M.D.   On: 11/21/2022 15:45   DG FEMUR 1V RIGHT  Result Date: 11/21/2022 CLINICAL DATA:  Rectal cancer with bone metastasis, right ischium. Right hip pain. Rule out fracture. EXAM: DG HIP (WITH OR WITHOUT PELVIS) 1V RIGHT; RIGHT FEMUR 1 VIEW COMPARISON:  CT 10/14/2022 FINDINGS: Pelvis and right hip: Sclerosis about the left iliac bone corresponds to sclerotic bone lesion on recent CT. Additional areas of bony sclerosis involving the sacrum are not as well demonstrated. Expansile lesion involving the left superior pubic ramus is again seen. Ill-defined areas of sclerosis involving the right posterior acetabulum and ischium corresponding to sclerotic metastatic disease. There is no radiographic evidence of fracture. The femoral head is well seated, no hip dislocation. Left hip arthroplasty is intact were visualized. Right femur: Linear area of sclerosis in the distal femoral shaft is of unknown significance. There is no cortical thinning. Moderate osteoarthritis of the knee. No femur fracture. IMPRESSION: 1. No radiographic evidence of fracture of the right hip or right femur. Particularly, no evidence of right acetabular fracture. If clinically indicated, MRI is more sensitive for assessment of pathologic fracture. 2. Multifocal sclerotic bone lesions consistent with known metastatic disease. 3. Linear sclerosis involving the distal femoral shaft may represent a sclerotic lesion, but is nonspecific. There is no cortical thinning. Electronically Signed   By: Narda Rutherford M.D.   On: 11/21/2022 15:45    ASSESSMENT & PLAN Joseph Hogan is  a 62 y.o. male who presents to the clinic for a follow up for rectal carcinoma.   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  --We reviewed that his cancer is incurable and mainstay treatment is chemotherapy. --Recommend chemotherapy regimen FOLFOX plus Bevacizumab q 2 weeks, started on 04/08/2022.  --progression noted on last PET/CT scan from 07/11/2022. D/c FOLFOX + Bev and start FOLFIRI + Panitumumab on 07/31/2022. PLAN: --Due for Cycle 9, Day 1 today --Labs from today reviewed and adequate for treatment. WBC 3.3, Hgb 8.7, MCV 90.8, Plt 292. Creatinine and LFTs adequate.  --Proceed with treatment today without any dose modifications. Continue Panitumumab today but dose reduced at 4 mg/kg.  --RTC in 2 weeks before Cycle 10, Day 1 of FOLFIRI + Panitumumab   #Acneiform rash-- resolved --Secondary to panitumumab --resumed panitumumab dose reduced at 4 mg/kg.  --continue doxycycline 100 mg BID and Cleocin gel for spot treatment. Advised him the importance of taking both medications to minimize severity of rash.   #Microcytic anemia: #Thrombocytosis: --patient received IV venofer 200 mg once a week x 3  #Right shoulder pain # Leg Pain  # Bone Pain from Metastatic Cancer  --Right shoulder xray  form 04/08/2022 showed destructive lesion within the proximal to mid right humerus shaft with acute to subacture pathologic fracture. --Patient underwent pinning of right humerus on 04/12/2022 --Received palliative radiation from 05/14/2022-06/03/2022 to right humerus, L3 spine and pelvis --Current pain regimen includes Xtampza 27 mg q 12 hours, oxycodone 5-10 mg q 6 hours for breakthrough pain.   --following with palliative care team.   #Supportive Care -- chemotherapy education complete -- port placement  complete.  -- zofran 8mg  q8H PRN and compazine 10mg  PO q6H for nausea -- EMLA cream for port  No orders of the defined types were placed in this encounter.   All questions were answered. The patient knows to call the clinic with any problems, questions or concerns.  I have spent a total of 30 minutes minutes of face-to-face and non-face-to-face time, preparing to see the patient, performing a medically appropriate examination, counseling and educating the patient, ordering medications/tests/procedures,  documenting clinical information in the electronic health record,  and care coordination.    Ulysees Barns, MD Department of Hematology/Oncology [facility] Phone: [phone] Pager: [phone] Email: [email or web address]

## 2022-12-18 NOTE — Patient Instructions (Signed)
[facility] - A DEPT OF [facility]  Discharge Instructions: Thank you for choosing [facility] to provide your oncology and hematology care.   If you have a lab appointment with the Cancer Center, please go directly to the Cancer Center and check in at the registration area.   Wear comfortable clothing and clothing appropriate for easy access to any Portacath or PICC line.   We strive to give you quality time with your provider. You may need to reschedule your appointment if you arrive late (15 or more minutes).  Arriving late affects you and other patients whose appointments are after yours.  Also, if you miss three or more appointments without notifying the office, you may be dismissed from the clinic at the provider's discretion.      For prescription refill requests, have your pharmacy contact our office and allow 72 hours for refills to be completed.    Today you received the following chemotherapy and/or immunotherapy agents irinotecan, leucovorin, fluorourcil, vectibix      To help prevent nausea and vomiting after your treatment, we encourage you to take your nausea medication as directed.  BELOW ARE SYMPTOMS THAT SHOULD BE REPORTED IMMEDIATELY: *FEVER GREATER THAN 100.4 F (38 C) OR HIGHER *CHILLS OR SWEATING *NAUSEA AND VOMITING THAT IS NOT CONTROLLED WITH YOUR NAUSEA MEDICATION *UNUSUAL SHORTNESS OF BREATH *UNUSUAL BRUISING OR BLEEDING *URINARY PROBLEMS (pain or burning when urinating, or frequent urination) *BOWEL PROBLEMS (unusual diarrhea, constipation, pain near the anus) TENDERNESS IN MOUTH AND THROAT WITH OR WITHOUT PRESENCE OF ULCERS (sore throat, sores in mouth, or a toothache) UNUSUAL RASH, SWELLING OR PAIN  UNUSUAL VAGINAL DISCHARGE OR ITCHING   Items with * indicate a potential emergency and should be followed up as soon as possible or go to the Emergency Department if any problems should occur.  Please show the  CHEMOTHERAPY ALERT CARD or IMMUNOTHERAPY ALERT CARD at check-in to the Emergency Department and triage nurse.  Should you have questions after your visit or need to cancel or reschedule your appointment, please contact [facility] - A DEPT OF [facility]  Dept: [phone]  and follow the prompts.  Office hours are 8:00 a.m. to 4:30 p.m. Monday - Friday. Please note that voicemails left after 4:00 p.m. may not be returned until the following business day.  We are closed weekends and major holidays. You have access to a nurse at all times for urgent questions. Please call the main number to the clinic Dept: [phone] and follow the prompts.   For any non-urgent questions, you may also contact your provider using MyChart. We now offer e-Visits for anyone 72 and older to request care online for non-urgent symptoms. For details visit [email or web address].   Also download the MyChart app! Go to the app store, search "MyChart", open the app, select [place], and log in with your MyChart username and password.

## 2022-12-19 ENCOUNTER — Other Ambulatory Visit: Payer: Self-pay

## 2022-12-19 ENCOUNTER — Other Ambulatory Visit (HOSPITAL_COMMUNITY): Payer: Self-pay

## 2022-12-19 ENCOUNTER — Ambulatory Visit
Admission: RE | Admit: 2022-12-19 | Discharge: 2022-12-19 | Disposition: A | Discharge status: Home / Self Care | Payer: Medicare HMO | Source: Ambulatory Visit | Attending: Radiation Oncology | Admitting: Radiation Oncology

## 2022-12-19 DIAGNOSIS — C7951 Secondary malignant neoplasm of bone: Secondary | ICD-10-CM | POA: Diagnosis not present

## 2022-12-19 DIAGNOSIS — D75839 Thrombocytosis, unspecified: Secondary | ICD-10-CM | POA: Diagnosis not present

## 2022-12-19 DIAGNOSIS — D509 Iron deficiency anemia, unspecified: Secondary | ICD-10-CM | POA: Diagnosis not present

## 2022-12-19 DIAGNOSIS — Z5111 Encounter for antineoplastic chemotherapy: Secondary | ICD-10-CM | POA: Diagnosis not present

## 2022-12-19 DIAGNOSIS — C20 Malignant neoplasm of rectum: Secondary | ICD-10-CM | POA: Diagnosis not present

## 2022-12-19 DIAGNOSIS — Z510 Encounter for antineoplastic radiation therapy: Secondary | ICD-10-CM | POA: Diagnosis not present

## 2022-12-19 DIAGNOSIS — G893 Neoplasm related pain (acute) (chronic): Secondary | ICD-10-CM | POA: Diagnosis not present

## 2022-12-19 DIAGNOSIS — Z79899 Other long term (current) drug therapy: Secondary | ICD-10-CM | POA: Diagnosis not present

## 2022-12-19 DIAGNOSIS — Z5112 Encounter for antineoplastic immunotherapy: Secondary | ICD-10-CM | POA: Diagnosis not present

## 2022-12-19 LAB — RAD ONC ARIA SESSION SUMMARY
Course Elapsed Days: 10
Plan Fractions Treated to Date: 9
Plan Prescribed Dose Per Fraction: 2.5 Gy
Plan Total Fractions Prescribed: 15
Plan Total Prescribed Dose: 37.5 Gy
Reference Point Dosage Given to Date: 22.5 Gy
Reference Point Session Dosage Given: 2.5 Gy
Session Number: 9

## 2022-12-20 ENCOUNTER — Other Ambulatory Visit: Payer: Self-pay

## 2022-12-20 ENCOUNTER — Other Ambulatory Visit (HOSPITAL_COMMUNITY): Payer: Self-pay

## 2022-12-20 ENCOUNTER — Inpatient Hospital Stay: Payer: Medicare HMO

## 2022-12-20 ENCOUNTER — Ambulatory Visit
Admission: RE | Admit: 2022-12-20 | Discharge: 2022-12-20 | Disposition: A | Discharge status: Home / Self Care | Payer: Medicare HMO | Source: Ambulatory Visit | Attending: Radiation Oncology

## 2022-12-20 ENCOUNTER — Ambulatory Visit
Admission: RE | Admit: 2022-12-20 | Discharge: 2022-12-20 | Disposition: A | Discharge status: Home / Self Care | Payer: Medicare HMO | Source: Ambulatory Visit | Attending: Radiation Oncology | Admitting: Radiation Oncology

## 2022-12-20 DIAGNOSIS — D509 Iron deficiency anemia, unspecified: Secondary | ICD-10-CM | POA: Diagnosis not present

## 2022-12-20 DIAGNOSIS — Z5112 Encounter for antineoplastic immunotherapy: Secondary | ICD-10-CM | POA: Diagnosis not present

## 2022-12-20 DIAGNOSIS — D75839 Thrombocytosis, unspecified: Secondary | ICD-10-CM | POA: Diagnosis not present

## 2022-12-20 DIAGNOSIS — Z5111 Encounter for antineoplastic chemotherapy: Secondary | ICD-10-CM | POA: Diagnosis not present

## 2022-12-20 DIAGNOSIS — G893 Neoplasm related pain (acute) (chronic): Secondary | ICD-10-CM | POA: Diagnosis not present

## 2022-12-20 DIAGNOSIS — Z79899 Other long term (current) drug therapy: Secondary | ICD-10-CM | POA: Diagnosis not present

## 2022-12-20 DIAGNOSIS — Z510 Encounter for antineoplastic radiation therapy: Secondary | ICD-10-CM | POA: Diagnosis not present

## 2022-12-20 DIAGNOSIS — C7951 Secondary malignant neoplasm of bone: Secondary | ICD-10-CM | POA: Diagnosis not present

## 2022-12-20 DIAGNOSIS — C20 Malignant neoplasm of rectum: Secondary | ICD-10-CM

## 2022-12-20 LAB — RAD ONC ARIA SESSION SUMMARY
Course Elapsed Days: 11
Plan Fractions Treated to Date: 10
Plan Prescribed Dose Per Fraction: 2.5 Gy
Plan Total Fractions Prescribed: 15
Plan Total Prescribed Dose: 37.5 Gy
Reference Point Dosage Given to Date: 25 Gy
Reference Point Session Dosage Given: 2.5 Gy
Session Number: 10

## 2022-12-20 MED ORDER — SODIUM CHLORIDE 0.9% FLUSH
10.0000 mL | INTRAVENOUS | Status: DC | PRN
  Administered 2022-12-20: 10 mL

## 2022-12-20 MED ORDER — HEPARIN SOD (PORK) LOCK FLUSH 100 UNIT/ML IV SOLN
500.0000 [IU] | Freq: Once | INTRAVENOUS | Status: AC | PRN
Start: 2022-12-20 — End: 2022-12-20
  Administered 2022-12-20: 500 [IU]

## 2022-12-23 ENCOUNTER — Other Ambulatory Visit (HOSPITAL_COMMUNITY): Payer: Self-pay

## 2022-12-23 ENCOUNTER — Encounter: Payer: Self-pay | Admitting: Radiation Oncology

## 2022-12-23 ENCOUNTER — Ambulatory Visit
Admission: RE | Admit: 2022-12-23 | Discharge: 2022-12-23 | Disposition: A | Discharge status: Home / Self Care | Payer: Medicare HMO | Source: Ambulatory Visit | Attending: Radiation Oncology | Admitting: Radiation Oncology

## 2022-12-23 ENCOUNTER — Other Ambulatory Visit: Payer: Self-pay | Admitting: Radiation Oncology

## 2022-12-23 ENCOUNTER — Other Ambulatory Visit: Payer: Self-pay

## 2022-12-23 DIAGNOSIS — C20 Malignant neoplasm of rectum: Secondary | ICD-10-CM | POA: Diagnosis not present

## 2022-12-23 DIAGNOSIS — D509 Iron deficiency anemia, unspecified: Secondary | ICD-10-CM | POA: Diagnosis not present

## 2022-12-23 DIAGNOSIS — Z5111 Encounter for antineoplastic chemotherapy: Secondary | ICD-10-CM | POA: Diagnosis not present

## 2022-12-23 DIAGNOSIS — Z79899 Other long term (current) drug therapy: Secondary | ICD-10-CM | POA: Diagnosis not present

## 2022-12-23 DIAGNOSIS — Z5112 Encounter for antineoplastic immunotherapy: Secondary | ICD-10-CM | POA: Diagnosis not present

## 2022-12-23 DIAGNOSIS — C7951 Secondary malignant neoplasm of bone: Secondary | ICD-10-CM | POA: Diagnosis not present

## 2022-12-23 DIAGNOSIS — G893 Neoplasm related pain (acute) (chronic): Secondary | ICD-10-CM | POA: Diagnosis not present

## 2022-12-23 DIAGNOSIS — D75839 Thrombocytosis, unspecified: Secondary | ICD-10-CM | POA: Diagnosis not present

## 2022-12-23 DIAGNOSIS — Z510 Encounter for antineoplastic radiation therapy: Secondary | ICD-10-CM | POA: Diagnosis not present

## 2022-12-23 LAB — RAD ONC ARIA SESSION SUMMARY
Course Elapsed Days: 14
Plan Fractions Treated to Date: 11
Plan Prescribed Dose Per Fraction: 2.5 Gy
Plan Total Fractions Prescribed: 15
Plan Total Prescribed Dose: 37.5 Gy
Reference Point Dosage Given to Date: 27.5 Gy
Reference Point Session Dosage Given: 2.5 Gy
Session Number: 11

## 2022-12-23 MED ORDER — FLUCONAZOLE 150 MG PO TABS
150.0000 mg | ORAL_TABLET | Freq: Every day | ORAL | 0 refills | Status: DC
Start: 2022-12-23 — End: 2023-02-26
  Filled 2022-12-23: qty 1, 1d supply, fill #0

## 2022-12-23 NOTE — Progress Notes (Deleted)
Palliative Medicine [facility]  Telephone:(336) [phone] Fax:(336) [phone]   Name: Joseph Hogan Date: 12/23/2022 MRN: [id number]  DOB: Dec 06, 1960  Patient Care Team: [name], MD as PCP - General (Hematology and Oncology) Doreatha Massed, MD as Medical Oncologist (Medical Oncology) Pickenpack-Cousar, Arty Baumgartner, NP as Nurse Practitioner [facility] and Palliative Medicine)   I connected with Joseph Hogan on 12/23/22 at  1:45 PM EST by phone and verified that I am speaking with the correct person using two identifiers.   I discussed the limitations, risks, security and privacy concerns of performing an evaluation and management service by telemedicine and the availability of in-person appointments. I also discussed with the patient that there may be a patient responsible charge related to this service. The patient expressed understanding and agreed to proceed.   Other persons participating in the visit and their role in the encounter: n/a   Patient's location: home  Provider's location: [facility]   Chief Complaint: f/u of symptom management    INTERVAL HISTORY: Joseph Hogan is a 62 y.o. 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 Palliative ask to see for symptom management and goals of care.   SOCIAL HISTORY:     reports that he has never smoked. He has never used smokeless tobacco. He reports that he does not drink alcohol and does not use drugs.  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  MEDICATIONS:  Current Outpatient Medications  Medication Sig Dispense Refill   clindamycin (CLEOCIN-T) 1 % lotion Apply to rash as needed. (Patient not taking: Reported on 11/21/2022) 60 mL 0   cyanocobalamin (VITAMIN B12) 1000 MCG tablet Take 1 tablet (1,000 mcg total) by mouth daily. 30 tablet 1   doxycycline (VIBRA-TABS) 100 MG tablet Take 1 tablet (100 mg total) by mouth 2 (two) times daily. 60 tablet 3   DULoxetine (CYMBALTA) 30 MG capsule Take 1 capsule (30 mg total) by mouth daily. (Patient not taking: Reported on 12/05/2022) 30 capsule 0   gabapentin (NEURONTIN) 300 MG capsule Take 1 capsule (300 mg total) by mouth at bedtime. (Patient not taking: Reported on 07/31/2022) 30 capsule 1   lidocaine-prilocaine (EMLA) cream Apply a quarter-sized amount to port a cath site and cover with plastic wrap one hour prior to infusion appointments (Patient not taking: Reported on 09/11/2022) 30 g 3   magnesium oxide (MAG-OX) 400 (240 Mg) MG tablet Take 1 tablet (400 mg total) by mouth daily. 30 tablet 0   ondansetron (ZOFRAN) 8 MG tablet Take 2 tablets (16 mg total) by mouth every 8 (eight) hours as needed for nausea or vomiting. (Patient not taking: Reported on 08/14/2022) 20 tablet 3   oxyCODONE ER (XTAMPZA ER) 36 MG C12A Take 1 capsule (36 mg total) by mouth every 12 (twelve) hours. 60 capsule 0   Oxycodone HCl 10 MG TABS Take 1 tablet (10 mg total) by mouth every 6 (six)  hours as needed. 60 tablet 0   pantoprazole (PROTONIX) 20 MG tablet Take 1 tablet (20 mg total) by mouth daily. 30 tablet 0   polyethylene glycol (MIRALAX / GLYCOLAX) 17 g packet Take 17 g by mouth daily as needed for mild constipation. 14 each 0   potassium chloride (KLOR-CON M) 10 MEQ tablet Take 1 tablet (10 mEq total) by mouth daily. 30 tablet 0   senna-docusate (SENOKOT-S) 8.6-50 MG tablet Take 1 tablet by mouth 2 (two) times daily.  30 tablet 0   No current facility-administered medications for this visit.    VITAL SIGNS: There were no vitals taken for this visit. There were no vitals filed for this visit.  Estimated body mass index is 25.1 kg/m as calculated from the following:   Height as of 11/27/22: 5\' 11"  (1.803 m).   Weight as of 12/18/22: 180 lb (81.6 kg).   PERFORMANCE STATUS (ECOG) : 2 - Symptomatic, <50% confined to bed  Discussed the use of AI scribe software for clinical note transcription with the patient, who gave verbal consent to proceed.   IMPRESSION:  Joseph Hogan presents to clinic for symptom management follow-up. No acute distress. His son is present. Patient is in a wheelchair. Skin continues to be dry. He reports no pain and describes the sensation in his right hip as an occasional 'tingle.' He has been managing his medications well, with a noted decrease in his supply of a 12-hour medication, which will be refilled. He is much appreciative in his improvement in pain.  Joseph Hogan has his pain medication bottles with him today. All medications reviewed. He is taking as prescribed. He has his oxycodone 10mg  breakthrough medication which he has used sparingly when needed.  Reports sleeping well, typically from 10pm to around 9 or 10am. He experienced a bout of diarrhea lasting four to five days, which has since resolved. During this period, he took two painkillers, which seemed to help stop the diarrhea. His appetite has been fluctuating, typically decreasing around the time of his treatments, but it has been improving in the last two days. We discussed the need to increase his appetite. Current weight is 180lbs down from 191lbs on 10/24, 209lbs on 10/16. We will connect him with a dietician for additional support.   We reviewed patient's scheduled for regular treatments and radiation, with a long treatment day including a doctor's appointment coming up. He has a pump that is typically removed on Fridays. Son  verbalized understanding of calendar of events.  Goals of Care  11/07/22- We discussed his current illness and what it means in the larger context of his on-going co-morbidities. Natural disease trajectory and expectations were discussed.   Patient's son verbalized understanding of his father's current illness. Patient also able to acknowledge some form of understanding but with some obvious signs of confusion when it comes to memory recall. He mentions his children assist with most of his medical needs.    I empathetically approached discussions regarding healthcare limitations including code status and advanced directives. Son, Ralphael states they are in the process of completing documents. Joseph Hogan (patient) confirms. They verbalize patient's son Bralen Deschaine is primary medical decision maker. They request patient to remain full code with full scope care. Joseph Hogan and son are clear in expressed wishes to continue to treat the treatable allowing patient every opportunity to continue to do as good as he can for as long as he can while managing his symptoms.     We  discussed Her current illness and what it means in the larger context of Her on-going co-morbidities. Natural disease trajectory and expectations were discussed.  I discussed the importance of continued conversation with family and their medical providers regarding overall plan of care and treatment options, ensuring decisions are within the context of the patients values and GOCs.  PLAN:  Pain Management Pain well controlled with current regimen. Patient has been taking Oxycodone 10mg  as needed for breakthrough pain.  -Continue Oxycodone 10mg  as needed for pain. Marlowe Kays 36mg  every 12 hours.  -His daughter manages weekly pill container   Medication Refill Patient's medication is running low. -Send refill to local pharmacy.  Gastrointestinal Issues Patient experienced diarrhea for several days, which has now  resolved. -Monitor symptoms.  Appetite Decreased appetite post-treatment, improving as next treatment approaches. -Encourage patient to continue working on improving appetite. -Weight significantly decreased over the past month.  -Dietician referral   Cancer Treatment Patient is undergoing radiation therapy and has chemotherapy scheduled. -Continue with current treatment plan. -Next chemotherapy session scheduled for 12/18/2022.  Follow-up Patient to return for treatment and doctor's appointment on 12/18/2022. -Will follow-up in clinic on 12/26/2022. Patient expressed understanding and was in agreement with this plan. He also understands that He can call the clinic at any time with any questions, concerns, or complaints.   Any controlled substances utilized were prescribed in the context of palliative care. PDMP has been reviewed.   Visit consisted of counseling and education dealing with the complex and emotionally intense issues of symptom management and palliative care in the setting of serious and potentially life-threatening illness.  Willette Alma, AGPCNP-BC  Palliative Medicine Team/[facility]  *Please note that this is a verbal dictation therefore any spelling or grammatical errors are due to the "Dragon [employer] One" system interpretation.

## 2022-12-23 NOTE — Progress Notes (Signed)
The patient was seen today for evaluation of his skin.  He is currently receiving palliative radiation over 15 fractions to the right pelvis and has completed 11 fractions to date.  He is also receiving FOLFIRINOX and panitumumab systemic treatment for his metastatic disease.  He has had skin changes related to his panitumumab which are being managed by medical oncology.  He has been having symptoms of burning and discomfort in the groin for the last week and has been using clotrimazole twice daily.  He has not seen improvement in this.  He is also trying to increase his oral protein as he has lower extremity edema and low albumin levels.  His son states that he has also been developing some skin changes posteriorly along the gluteal cleft.  On examination, the patient has changes in bilateral groins consistent with erythema of cutaneous Candida there is also cream in the folds.  The patient states this was his medication he put on.  He has some scrotal edema associated with erythema but no focal findings otherwise.  Posteriorly above the gluteal cleft at the level of the sacrum the patient has dry desquamation and focal areas on the right of brisk dermatitis.  We discussed the use of Silvadene and sonafine alternating this in the posterior aspect.  We also discussed continuation of clotrimazole but to increase to 3 times daily. I also discussed  single dose of diflucan and sent a prescription to his pharmacy. We will also follow up with him regarding his symptoms later this week.   We also reviewed modalities of protein, and increasing oral protein intake as per Dr. Derek Mound direction. He has bilateral lower extremity edema that is 1+ without calf tenderness. If this progresses he will notify his care team.

## 2022-12-24 ENCOUNTER — Other Ambulatory Visit (HOSPITAL_COMMUNITY): Payer: Self-pay

## 2022-12-24 ENCOUNTER — Other Ambulatory Visit: Payer: Self-pay

## 2022-12-24 ENCOUNTER — Ambulatory Visit
Admission: RE | Admit: 2022-12-24 | Discharge: 2022-12-24 | Disposition: A | Discharge status: Home / Self Care | Payer: Medicare HMO | Source: Ambulatory Visit | Attending: Radiation Oncology

## 2022-12-24 DIAGNOSIS — Z5111 Encounter for antineoplastic chemotherapy: Secondary | ICD-10-CM | POA: Diagnosis not present

## 2022-12-24 DIAGNOSIS — Z510 Encounter for antineoplastic radiation therapy: Secondary | ICD-10-CM | POA: Diagnosis not present

## 2022-12-24 DIAGNOSIS — G893 Neoplasm related pain (acute) (chronic): Secondary | ICD-10-CM | POA: Diagnosis not present

## 2022-12-24 DIAGNOSIS — D75839 Thrombocytosis, unspecified: Secondary | ICD-10-CM | POA: Diagnosis not present

## 2022-12-24 DIAGNOSIS — Z5112 Encounter for antineoplastic immunotherapy: Secondary | ICD-10-CM | POA: Diagnosis not present

## 2022-12-24 DIAGNOSIS — Z79899 Other long term (current) drug therapy: Secondary | ICD-10-CM | POA: Diagnosis not present

## 2022-12-24 DIAGNOSIS — C7951 Secondary malignant neoplasm of bone: Secondary | ICD-10-CM | POA: Diagnosis not present

## 2022-12-24 DIAGNOSIS — D509 Iron deficiency anemia, unspecified: Secondary | ICD-10-CM | POA: Diagnosis not present

## 2022-12-24 DIAGNOSIS — C20 Malignant neoplasm of rectum: Secondary | ICD-10-CM | POA: Diagnosis not present

## 2022-12-24 LAB — RAD ONC ARIA SESSION SUMMARY
Course Elapsed Days: 15
Plan Fractions Treated to Date: 12
Plan Prescribed Dose Per Fraction: 2.5 Gy
Plan Total Fractions Prescribed: 15
Plan Total Prescribed Dose: 37.5 Gy
Reference Point Dosage Given to Date: 30 Gy
Reference Point Session Dosage Given: 2.5 Gy
Session Number: 12

## 2022-12-25 ENCOUNTER — Other Ambulatory Visit: Payer: Self-pay

## 2022-12-25 ENCOUNTER — Ambulatory Visit
Admission: RE | Admit: 2022-12-25 | Discharge: 2022-12-25 | Disposition: A | Discharge status: Home / Self Care | Payer: Medicare HMO | Source: Ambulatory Visit | Attending: Radiation Oncology | Admitting: Radiation Oncology

## 2022-12-25 ENCOUNTER — Other Ambulatory Visit (HOSPITAL_COMMUNITY): Payer: Self-pay

## 2022-12-25 DIAGNOSIS — Z5112 Encounter for antineoplastic immunotherapy: Secondary | ICD-10-CM | POA: Diagnosis not present

## 2022-12-25 DIAGNOSIS — C20 Malignant neoplasm of rectum: Secondary | ICD-10-CM | POA: Diagnosis not present

## 2022-12-25 DIAGNOSIS — D509 Iron deficiency anemia, unspecified: Secondary | ICD-10-CM | POA: Diagnosis not present

## 2022-12-25 DIAGNOSIS — D630 Anemia in neoplastic disease: Secondary | ICD-10-CM | POA: Diagnosis not present

## 2022-12-25 DIAGNOSIS — M1990 Unspecified osteoarthritis, unspecified site: Secondary | ICD-10-CM | POA: Diagnosis not present

## 2022-12-25 DIAGNOSIS — D75839 Thrombocytosis, unspecified: Secondary | ICD-10-CM | POA: Diagnosis not present

## 2022-12-25 DIAGNOSIS — G893 Neoplasm related pain (acute) (chronic): Secondary | ICD-10-CM | POA: Diagnosis not present

## 2022-12-25 DIAGNOSIS — E119 Type 2 diabetes mellitus without complications: Secondary | ICD-10-CM | POA: Diagnosis not present

## 2022-12-25 DIAGNOSIS — Z79899 Other long term (current) drug therapy: Secondary | ICD-10-CM | POA: Diagnosis not present

## 2022-12-25 DIAGNOSIS — C7951 Secondary malignant neoplasm of bone: Secondary | ICD-10-CM | POA: Diagnosis not present

## 2022-12-25 DIAGNOSIS — Z5111 Encounter for antineoplastic chemotherapy: Secondary | ICD-10-CM | POA: Diagnosis not present

## 2022-12-25 DIAGNOSIS — E785 Hyperlipidemia, unspecified: Secondary | ICD-10-CM | POA: Diagnosis not present

## 2022-12-25 DIAGNOSIS — Z510 Encounter for antineoplastic radiation therapy: Secondary | ICD-10-CM | POA: Diagnosis not present

## 2022-12-25 DIAGNOSIS — I10 Essential (primary) hypertension: Secondary | ICD-10-CM | POA: Diagnosis not present

## 2022-12-25 DIAGNOSIS — D500 Iron deficiency anemia secondary to blood loss (chronic): Secondary | ICD-10-CM | POA: Diagnosis not present

## 2022-12-25 LAB — RAD ONC ARIA SESSION SUMMARY
Course Elapsed Days: 16
Plan Fractions Treated to Date: 13
Plan Prescribed Dose Per Fraction: 2.5 Gy
Plan Total Fractions Prescribed: 15
Plan Total Prescribed Dose: 37.5 Gy
Reference Point Dosage Given to Date: 32.5 Gy
Reference Point Session Dosage Given: 2.5 Gy
Session Number: 13

## 2022-12-26 ENCOUNTER — Ambulatory Visit
Admission: RE | Admit: 2022-12-26 | Discharge: 2022-12-26 | Disposition: A | Discharge status: Home / Self Care | Payer: Medicare HMO | Source: Ambulatory Visit | Attending: Radiation Oncology | Admitting: Radiation Oncology

## 2022-12-26 ENCOUNTER — Other Ambulatory Visit: Payer: Self-pay

## 2022-12-26 ENCOUNTER — Inpatient Hospital Stay: Payer: Medicare HMO | Admitting: Nurse Practitioner

## 2022-12-26 ENCOUNTER — Inpatient Hospital Stay: Payer: Medicare HMO | Admitting: Nutrition

## 2022-12-26 ENCOUNTER — Inpatient Hospital Stay: Payer: Medicare HMO | Admitting: Dietician

## 2022-12-26 DIAGNOSIS — D75839 Thrombocytosis, unspecified: Secondary | ICD-10-CM | POA: Diagnosis not present

## 2022-12-26 DIAGNOSIS — G893 Neoplasm related pain (acute) (chronic): Secondary | ICD-10-CM | POA: Diagnosis not present

## 2022-12-26 DIAGNOSIS — Z5112 Encounter for antineoplastic immunotherapy: Secondary | ICD-10-CM | POA: Diagnosis not present

## 2022-12-26 DIAGNOSIS — D509 Iron deficiency anemia, unspecified: Secondary | ICD-10-CM | POA: Diagnosis not present

## 2022-12-26 DIAGNOSIS — Z510 Encounter for antineoplastic radiation therapy: Secondary | ICD-10-CM | POA: Diagnosis not present

## 2022-12-26 DIAGNOSIS — Z79899 Other long term (current) drug therapy: Secondary | ICD-10-CM | POA: Diagnosis not present

## 2022-12-26 DIAGNOSIS — Z5111 Encounter for antineoplastic chemotherapy: Secondary | ICD-10-CM | POA: Diagnosis not present

## 2022-12-26 DIAGNOSIS — C20 Malignant neoplasm of rectum: Secondary | ICD-10-CM | POA: Diagnosis not present

## 2022-12-26 DIAGNOSIS — C7951 Secondary malignant neoplasm of bone: Secondary | ICD-10-CM | POA: Diagnosis not present

## 2022-12-26 LAB — RAD ONC ARIA SESSION SUMMARY
Course Elapsed Days: 17
Plan Fractions Treated to Date: 14
Plan Prescribed Dose Per Fraction: 2.5 Gy
Plan Total Fractions Prescribed: 15
Plan Total Prescribed Dose: 37.5 Gy
Reference Point Dosage Given to Date: 35 Gy
Reference Point Session Dosage Given: 2.5 Gy
Session Number: 14

## 2022-12-26 MED ORDER — SILVER SULFADIAZINE 1 % EX CREA: TOPICAL_CREAM | Freq: Once | CUTANEOUS | Status: AC

## 2022-12-27 ENCOUNTER — Other Ambulatory Visit: Payer: Self-pay

## 2022-12-27 ENCOUNTER — Ambulatory Visit
Admission: RE | Admit: 2022-12-27 | Discharge: 2022-12-27 | Disposition: A | Discharge status: Home / Self Care | Payer: Medicare HMO | Source: Ambulatory Visit | Attending: Radiation Oncology | Admitting: Radiation Oncology

## 2022-12-27 DIAGNOSIS — G893 Neoplasm related pain (acute) (chronic): Secondary | ICD-10-CM | POA: Diagnosis not present

## 2022-12-27 DIAGNOSIS — C20 Malignant neoplasm of rectum: Secondary | ICD-10-CM | POA: Diagnosis not present

## 2022-12-27 DIAGNOSIS — D75839 Thrombocytosis, unspecified: Secondary | ICD-10-CM | POA: Diagnosis not present

## 2022-12-27 DIAGNOSIS — Z5112 Encounter for antineoplastic immunotherapy: Secondary | ICD-10-CM | POA: Diagnosis not present

## 2022-12-27 DIAGNOSIS — C7951 Secondary malignant neoplasm of bone: Secondary | ICD-10-CM | POA: Diagnosis not present

## 2022-12-27 DIAGNOSIS — Z510 Encounter for antineoplastic radiation therapy: Secondary | ICD-10-CM | POA: Diagnosis not present

## 2022-12-27 DIAGNOSIS — D509 Iron deficiency anemia, unspecified: Secondary | ICD-10-CM | POA: Diagnosis not present

## 2022-12-27 DIAGNOSIS — Z5111 Encounter for antineoplastic chemotherapy: Secondary | ICD-10-CM | POA: Diagnosis not present

## 2022-12-27 DIAGNOSIS — Z79899 Other long term (current) drug therapy: Secondary | ICD-10-CM | POA: Diagnosis not present

## 2022-12-27 LAB — RAD ONC ARIA SESSION SUMMARY
Course Elapsed Days: 18
Plan Fractions Treated to Date: 15
Plan Prescribed Dose Per Fraction: 2.5 Gy
Plan Total Fractions Prescribed: 15
Plan Total Prescribed Dose: 37.5 Gy
Reference Point Dosage Given to Date: 37.5 Gy
Reference Point Session Dosage Given: 2.5 Gy
Session Number: 15

## 2022-12-30 ENCOUNTER — Other Ambulatory Visit (HOSPITAL_COMMUNITY): Payer: Self-pay

## 2022-12-30 ENCOUNTER — Other Ambulatory Visit: Payer: Self-pay | Admitting: *Deleted

## 2022-12-30 DIAGNOSIS — I10 Essential (primary) hypertension: Secondary | ICD-10-CM | POA: Diagnosis not present

## 2022-12-30 DIAGNOSIS — C7951 Secondary malignant neoplasm of bone: Secondary | ICD-10-CM | POA: Diagnosis not present

## 2022-12-30 DIAGNOSIS — D630 Anemia in neoplastic disease: Secondary | ICD-10-CM | POA: Diagnosis not present

## 2022-12-30 DIAGNOSIS — G893 Neoplasm related pain (acute) (chronic): Secondary | ICD-10-CM | POA: Diagnosis not present

## 2022-12-30 DIAGNOSIS — M1990 Unspecified osteoarthritis, unspecified site: Secondary | ICD-10-CM | POA: Diagnosis not present

## 2022-12-30 DIAGNOSIS — C20 Malignant neoplasm of rectum: Secondary | ICD-10-CM | POA: Diagnosis not present

## 2022-12-30 DIAGNOSIS — E119 Type 2 diabetes mellitus without complications: Secondary | ICD-10-CM | POA: Diagnosis not present

## 2022-12-30 DIAGNOSIS — D500 Iron deficiency anemia secondary to blood loss (chronic): Secondary | ICD-10-CM | POA: Diagnosis not present

## 2022-12-30 DIAGNOSIS — E785 Hyperlipidemia, unspecified: Secondary | ICD-10-CM | POA: Diagnosis not present

## 2022-12-30 MED ORDER — PANTOPRAZOLE SODIUM 20 MG PO TBEC: 20.0000 mg | DELAYED_RELEASE_TABLET | Freq: Every day | ORAL | 3 refills | Status: DC

## 2022-12-30 NOTE — Radiation Completion Notes (Signed)
  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 The patient tolerated radiation. He developed fatigue and anticipated skin changes in the treatment field.   The patient will receive a call in about one month from the radiation oncology department. He will continue follow up with Dr. Leonides Schanz as well.      Osker Mason, PAC

## 2023-01-01 ENCOUNTER — Inpatient Hospital Stay: Payer: Medicare HMO | Admitting: Hematology and Oncology

## 2023-01-01 ENCOUNTER — Inpatient Hospital Stay: Payer: Medicare HMO

## 2023-01-01 ENCOUNTER — Other Ambulatory Visit: Payer: Self-pay | Admitting: *Deleted

## 2023-01-01 ENCOUNTER — Inpatient Hospital Stay: Payer: Medicare HMO | Admitting: Dietician

## 2023-01-01 VITALS — BP 114/68 | HR 73 | Temp 98.3°F | Resp 18 | Ht 71.0 in | Wt 172.1 lb

## 2023-01-01 DIAGNOSIS — Z5111 Encounter for antineoplastic chemotherapy: Secondary | ICD-10-CM | POA: Diagnosis not present

## 2023-01-01 DIAGNOSIS — C20 Malignant neoplasm of rectum: Secondary | ICD-10-CM

## 2023-01-01 DIAGNOSIS — Z95828 Presence of other vascular implants and grafts: Secondary | ICD-10-CM

## 2023-01-01 DIAGNOSIS — C7951 Secondary malignant neoplasm of bone: Secondary | ICD-10-CM | POA: Diagnosis not present

## 2023-01-01 DIAGNOSIS — G893 Neoplasm related pain (acute) (chronic): Secondary | ICD-10-CM | POA: Diagnosis not present

## 2023-01-01 DIAGNOSIS — Z510 Encounter for antineoplastic radiation therapy: Secondary | ICD-10-CM | POA: Diagnosis not present

## 2023-01-01 DIAGNOSIS — D75839 Thrombocytosis, unspecified: Secondary | ICD-10-CM | POA: Diagnosis not present

## 2023-01-01 DIAGNOSIS — Z5112 Encounter for antineoplastic immunotherapy: Secondary | ICD-10-CM | POA: Diagnosis not present

## 2023-01-01 DIAGNOSIS — D509 Iron deficiency anemia, unspecified: Secondary | ICD-10-CM | POA: Diagnosis not present

## 2023-01-01 DIAGNOSIS — Z79899 Other long term (current) drug therapy: Secondary | ICD-10-CM | POA: Diagnosis not present

## 2023-01-01 LAB — CBC WITH DIFFERENTIAL (CANCER CENTER ONLY)
Abs Immature Granulocytes: 0.01 10*3/uL (ref 0.00–0.07)
Basophils Absolute: 0 10*3/uL (ref 0.0–0.1)
Basophils Relative: 0 %
Eosinophils Absolute: 0.1 10*3/uL (ref 0.0–0.5)
Eosinophils Relative: 6 %
HCT: 27.7 % — ABNORMAL LOW (ref 39.0–52.0)
Hemoglobin: 8.9 g/dL — ABNORMAL LOW (ref 13.0–17.0)
Immature Granulocytes: 0 %
Lymphocytes Relative: 15 %
Lymphs Abs: 0.3 10*3/uL — ABNORMAL LOW (ref 0.7–4.0)
MCH: 29.2 pg (ref 26.0–34.0)
MCHC: 32.1 g/dL (ref 30.0–36.0)
MCV: 90.8 fL (ref 80.0–100.0)
Monocytes Absolute: 0.4 10*3/uL (ref 0.1–1.0)
Monocytes Relative: 15 %
Neutro Abs: 1.5 10*3/uL — ABNORMAL LOW (ref 1.7–7.7)
Neutrophils Relative %: 64 %
Platelet Count: 288 10*3/uL (ref 150–400)
RBC: 3.05 MIL/uL — ABNORMAL LOW (ref 4.22–5.81)
RDW: 17.6 % — ABNORMAL HIGH (ref 11.5–15.5)
WBC Count: 2.4 10*3/uL — ABNORMAL LOW (ref 4.0–10.5)
nRBC: 0 % (ref 0.0–0.2)

## 2023-01-01 LAB — CMP (CANCER CENTER ONLY)
ALT: <5 U/L (ref 0–44)
AST: 11 U/L — ABNORMAL LOW (ref 15–41)
Albumin: 3.3 g/dL — ABNORMAL LOW (ref 3.5–5.0)
Alkaline Phosphatase: 129 U/L — ABNORMAL HIGH (ref 38–126)
Anion gap: 6 (ref 5–15)
BUN: 10 mg/dL (ref 8–23)
CO2: 28 mmol/L (ref 22–32)
Calcium: 8.5 mg/dL — ABNORMAL LOW (ref 8.9–10.3)
Chloride: 103 mmol/L (ref 98–111)
Creatinine: 0.56 mg/dL — ABNORMAL LOW (ref 0.61–1.24)
GFR, Estimated: >60 mL/min (ref >60)
Glucose, Bld: 114 mg/dL — ABNORMAL HIGH (ref 70–99)
Potassium: 3.6 mmol/L (ref 3.5–5.1)
Sodium: 137 mmol/L (ref 135–145)
Total Bilirubin: 0.6 mg/dL (ref <1.2)
Total Protein: 5.9 g/dL — ABNORMAL LOW (ref 6.5–8.1)

## 2023-01-01 LAB — MAGNESIUM: Magnesium: 1.5 mg/dL — ABNORMAL LOW (ref 1.7–2.4)

## 2023-01-01 MED ORDER — IRINOTECAN HCL CHEMO INJECTION 100 MG/5ML
180.0000 mg/m2 | Freq: Once | INTRAVENOUS | Status: AC
  Administered 2023-01-01: 400 mg via INTRAVENOUS
  Filled 2023-01-01: qty 15

## 2023-01-01 MED ORDER — PALONOSETRON HCL INJECTION 0.25 MG/5ML
0.2500 mg | Freq: Once | INTRAVENOUS | Status: AC
  Administered 2023-01-01: 0.25 mg via INTRAVENOUS
  Filled 2023-01-01: qty 5

## 2023-01-01 MED ORDER — ATROPINE SULFATE 1 MG/ML IV SOLN
0.5000 mg | Freq: Once | INTRAVENOUS | Status: AC | PRN
  Administered 2023-01-01: 0.5 mg via INTRAVENOUS
  Filled 2023-01-01: qty 1

## 2023-01-01 MED ORDER — DEXAMETHASONE SODIUM PHOSPHATE 10 MG/ML IJ SOLN
10.0000 mg | Freq: Once | INTRAMUSCULAR | Status: AC
  Administered 2023-01-01: 10 mg via INTRAVENOUS
  Filled 2023-01-01: qty 1

## 2023-01-01 MED ORDER — SODIUM CHLORIDE 0.9 % IV SOLN: Freq: Once | INTRAVENOUS | Status: AC

## 2023-01-01 MED ORDER — SODIUM CHLORIDE 0.9 % IV SOLN
400.0000 mg/m2 | Freq: Once | INTRAVENOUS | Status: AC
  Administered 2023-01-01: 848 mg via INTRAVENOUS
  Filled 2023-01-01: qty 25

## 2023-01-01 MED ORDER — PANITUMUMAB CHEMO INJECTION 100 MG/5ML
300.0000 mg | Freq: Once | INTRAVENOUS | Status: AC
  Administered 2023-01-01: 300 mg via INTRAVENOUS
  Filled 2023-01-01: qty 15

## 2023-01-01 MED ORDER — MAGNESIUM SULFATE 2 GM/50ML IV SOLN
2.0000 g | Freq: Once | INTRAVENOUS | Status: AC
  Administered 2023-01-01: 2 g via INTRAVENOUS
  Filled 2023-01-01: qty 50

## 2023-01-01 MED ORDER — SODIUM CHLORIDE 0.9 % IV SOLN
2400.0000 mg/m2 | INTRAVENOUS | Status: DC
  Administered 2023-01-01: 5000 mg via INTRAVENOUS
  Filled 2023-01-01: qty 100

## 2023-01-01 MED ORDER — FLUOROURACIL CHEMO INJECTION 2.5 GM/50ML
400.0000 mg/m2 | Freq: Once | INTRAVENOUS | Status: AC
  Administered 2023-01-01: 850 mg via INTRAVENOUS
  Filled 2023-01-01: qty 17

## 2023-01-01 MED ORDER — SODIUM CHLORIDE 0.9% FLUSH
10.0000 mL | Freq: Once | INTRAVENOUS | Status: AC
  Administered 2023-01-01: 10 mL

## 2023-01-01 NOTE — Progress Notes (Signed)
Nutrition Follow-up:  Patient with metastatic rectal carcinoma. He is receiving concurrent chemoradiation with Folfiri + Panitumumab q14d. Final RT 11/15 under the care of Dr. Mitzi Hansen.   Met with patient in infusion. He reports improved appetite in the last week. Patient reports eating at least 3 good meals. Recalls swiss cheese omelette and protein pancake for breakfast, tuna fish sandwich for lunch, burger for dinner. He is drinking one protein shake. Patient is looking forward to Thanksgiving with his kids. Hoping he will be able to eat a lot. Patient endorses drinking plenty of water. He is also drinking organic apple and grape juice. Occasionally will have organic ginger ale. This settles his stomach better than anything else. Patient denies nausea, vomiting, diarrhea, constipation.   Medications: reviewed   Labs: Mg 1.5, albumin 3.3, Cr 0.36  Anthropometrics: Wt 172 lb 1.3 oz today decreased 4% in 2 weeks - severe   11/5 - 180 lb 14.4 oz    NUTRITION DIAGNOSIS: Unintended wt loss continues    INTERVENTION:  Reinforced importance of increased calorie/protein energy needs to minimize further wt loss Encouraged high protein snacks in between meals - pt has snack ideas  Continue drinking Ensure Complete/equivalent      MONITORING, EVALUATION, GOAL: wt trends, intake   NEXT VISIT: To be scheduled with treatment as needed

## 2023-01-01 NOTE — Patient Instructions (Signed)
 [facility] - A DEPT OF [facility]  Discharge Instructions: Thank you for choosing [facility] to provide your oncology and hematology care.   If you have a lab appointment with the Cancer Center, please go directly to the Cancer Center and check in at the registration area.   Wear comfortable clothing and clothing appropriate for easy access to any Portacath or PICC line.   We strive to give you quality time with your provider. You may need to reschedule your appointment if you arrive late (15 or more minutes).  Arriving late affects you and other patients whose appointments are after yours.  Also, if you miss three or more appointments without notifying the office, you may be dismissed from the clinic at the provider's discretion.      For prescription refill requests, have your pharmacy contact our office and allow 72 hours for refills to be completed.    Today you received the following chemotherapy and/or immunotherapy agents irinotecan, leucovorin, fluorourcil, vectibix      To help prevent nausea and vomiting after your treatment, we encourage you to take your nausea medication as directed.  BELOW ARE SYMPTOMS THAT SHOULD BE REPORTED IMMEDIATELY: *FEVER GREATER THAN 100.4 F (38 C) OR HIGHER *CHILLS OR SWEATING *NAUSEA AND VOMITING THAT IS NOT CONTROLLED WITH YOUR NAUSEA MEDICATION *UNUSUAL SHORTNESS OF BREATH *UNUSUAL BRUISING OR BLEEDING *URINARY PROBLEMS (pain or burning when urinating, or frequent urination) *BOWEL PROBLEMS (unusual diarrhea, constipation, pain near the anus) TENDERNESS IN MOUTH AND THROAT WITH OR WITHOUT PRESENCE OF ULCERS (sore throat, sores in mouth, or a toothache) UNUSUAL RASH, SWELLING OR PAIN  UNUSUAL VAGINAL DISCHARGE OR ITCHING   Items with * indicate a potential emergency and should be followed up as soon as possible or go to the Emergency Department if any problems should occur.  Please show the  CHEMOTHERAPY ALERT CARD or IMMUNOTHERAPY ALERT CARD at check-in to the Emergency Department and triage nurse.  Should you have questions after your visit or need to cancel or reschedule your appointment, please contact [facility] - A DEPT OF [facility]  Dept: [phone]  and follow the prompts.  Office hours are 8:00 a.m. to 4:30 p.m. Monday - Friday. Please note that voicemails left after 4:00 p.m. may not be returned until the following business day.  We are closed weekends and major holidays. You have access to a nurse at all times for urgent questions. Please call the main number to the clinic Dept: [phone] and follow the prompts.   For any non-urgent questions, you may also contact your provider using MyChart. We now offer e-Visits for anyone 72 and older to request care online for non-urgent symptoms. For details visit [email or web address].   Also download the MyChart app! Go to the app store, search "MyChart", open the app, select [place], and log in with your MyChart username and password.

## 2023-01-01 NOTE — Progress Notes (Signed)
[facility] [phone]   [phone]  PROGRESS NOTE  Patient Care Team: [name], MD as PCP - General (Hematology and Oncology) Doreatha Massed, MD as Medical Oncologist (Medical Oncology) Pickenpack-Cousar, Arty Baumgartner, NP as Nurse Practitioner (Hospice and Palliative Medicine)  DIAGNOSIS: Metastatic rectal carcinoma  ONCOLOGIC HISTORY: # Metastatic Rectal Adenocarcinoma 02/03/2022-02/11/2022: Presented to ED due to left hip pain after a mechanical fall.  02/04/2022: CT left JYN:WGNFA pathologic fracture of the left femoral neck with displacement and angulation, with underlying lytic lesions in the femoral head and neck.Additional lytic destructive lesion of the left parasymphyseal pubic bone extending throughout the superior pubic ramus to the puboacetabular junction, with pathologic fracture of the superior pubic ramus. Adjacent hypoattenuation in the pelvic component of the obturator internus muscle, suspicious for tumor involvement. Probable small lytic lesion in the inferior pubic ramus.Prominent left external iliac lymph nodes measuring up to 1.2 cm. 02/05/2022: CT CAP: Pathologic retroperitoneal and bilateral pelvic adenopathy, with lytic destructive lesions of the left superior and inferior pubic ramus and pubic body as well as a pathologic fracture through the left femoral neck. Pathologic fracture anteriorly in the right second rib. Deformity anteriorly in the right third rib probably from early pathologic fracture. Metastatic disease or myeloma strongly favored over multifocal osteomyelitis as a cause. Mildly enlarged left supraclavicular lymph node and lower thoracic periaortic lymph node. Pathologic retroperitoneal and pelvic adenopathy. 10 cm long segment of rectal wall thickening.1.1 cm exophytic lesion from the left mid kidney posterolaterally, nonspecific for complex cyst versus tumor. Bandlike atelectasis in both lungs.Small type 1  hiatal hernia.Lumbar spondylosis and degenerative disc disease causing generally mild multilevel impingement. Sigmoid colon diverticulosis.Wall thickening in the proximal stomach body, probably secondary to nondistention. 02/06/2022: Underwent left total hip arthroplasty. Pathology revealed poorly differentiated carcinoma.  SPEP/IFE did not detect monoclonal protein. PSA normal. 02/25/2022: Underwent colonoscopy that showed malignant tumor in the rectum. Pathology confirmed poorly differentiated carcinoma.  04/05/2022: Underwent port placement 04/08/2022: Cycle 1, Day 1 of FOLFOX (held bevacizumab due to recent port placement) 04/23/2022: Cycle 2, Day 1 of FOLFOX plus bevacizumab 05/07/2022: Cycle 3, Day 1 of FOLFOX plus bevacizumab 05/21/2022: Cycle 4, Day 1 of FOLFOX plus bevacizumab. Transitioned care to Dr. Caren Hazy at [facility] 07/02/2022: Cycle 6, Day 1 of FOLFOX plus bevacizumab at [facility].  07/11/2022: NM PET CT scan showed progression of hypermetabolic osseous metastatic disease, although there is some mixed change as detailed. New hypermetabolic right femoral neck metastasis places the patient at risk for pathologic right femoral neck fracture. 07/30/2022:  Cycle 1 Day 1  FOLFIRI +Panitumumab 08/14/2022: Cycle 2 Day 1 FOLFIRI + Panitumumab 08/27/2022: Cycle 3 Day 1 FOLFIRI + Panitumumab 09/11/2022: Cycle 4 Day 1 FOLFIRI (Hold Panitumumab due to acneiform rash) 10/02/2022: Cycle 5 Day 1 FOLFIRI (Hold Panitumumab due to acneiform rash) 11/07/2022: Cycle 6 Day 1 FOLFIRI plus Panitumumab dose reduced at 4 mg/kg.  11/21/2022: Cycle 7 Day 1 FOLFIRI plus Panitumumab dose reduced at 4 mg/kg.  12/05/2022: Cycle 8 Day 1 FOLFIRI plus Panitumumab dose reduced at 4 mg/kg.   HISTORY OF PRESENTING ILLNESS:  Joseph Hogan 62 y.o. male returns for follow-up for metastatic rectal cancer. He presents today to start Cycle 9, Day 1 today. He is unaccompanied for this visit.   On exam today, Mr. Shibata reports in  the interim since our last visit.  His appetite has been improved and his fatigue is also improving.  He reports that he feels like  he has been eating quite well for the last 3 days.  He reports that he is not having any rash or soreness of his skin on his current therapy.  He has been using Silvadene cream 1% which has been quite helpful but he is in need of a refill at this time.  He is not having any vomiting but is having some occasional nausea and diarrhea.  He reports that it generally comes and goes within 15 minutes.  He reports his pain is under excellent control right now with his Xtampza as well as the radiation therapy.  Overall he is willing and able to proceed with chemotherapy at this time.  He denies any fevers, chills, sweats, shortness of breath, chest pain or cough.  A full 10 point ROS is otherwise negative.  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   with abnormal R wave progression noted on EKG   MRSA (methicillin resistant Staphylococcus aureus)    Pre-diabetes    Rectal carcinoma (HCC) 03/19/2022    SURGICAL HISTORY: Past Surgical History:  Procedure Laterality Date   COLONOSCOPY     HUMERUS IM NAIL Right 04/12/2022   Procedure: INTRAMEDULLARY (IM) NAIL HUMERAL;  Surgeon: Bjorn Pippin, MD;  Location: [facility];  Service: Orthopedics;  Laterality: Right;   IR IMAGING GUIDED PORT INSERTION  04/05/2022   TOTAL HIP ARTHROPLASTY Left 02/06/2022   Procedure: TOTAL HIP ARTHROPLASTY;  Surgeon: Joen Laura, MD;  Location: [facility];  Service: Orthopedics;  Laterality: Left;    SOCIAL HISTORY: Social History   Socioeconomic History   Marital status: Divorced    Spouse name: Not on file   Number of children: Not on file   Years of education: Not on file   Highest education level: Not on file  Occupational History   Not on file  Tobacco Use   Smoking  status: Never   Smokeless tobacco: Never  Vaping Use   Vaping status: Never Used  Substance and Sexual Activity   Alcohol use: No   Drug use: No   Sexual activity: Not Currently  Other Topics Concern   Not on file  Social History Narrative   Not on file   Social Determinants of Health   Financial Resource Strain: Not on file  Food Insecurity: No Food Insecurity (10/15/2022)   Hunger Vital Sign    Worried About Running Out of Food in the Last Year: Never true    Ran Out of Food in the Last Year: Never true  Transportation Needs: No Transportation Needs (10/15/2022)   PRAPARE - Administrator, Civil Service (Medical): No    Lack of Transportation (Non-Medical): No  Physical Activity: Not on file  Stress: Not on file  Social Connections: Not on file  Intimate Partner Violence: Not At Risk (10/15/2022)   Humiliation, Afraid, Rape, and Kick questionnaire    Fear of Current or Ex-Partner: No    Emotionally Abused: No    Physically Abused: No    Sexually Abused: No    FAMILY HISTORY: Family History  Problem Relation Age of Onset   Stroke Mother    Hypertension Mother    Heart disease Father    Alzheimer's disease Father    Hypertension Brother    Alcohol abuse Brother    Cancer Maternal Aunt    Heart disease Maternal Aunt  Cancer Maternal Uncle    Heart disease Maternal Uncle    Cancer Paternal Aunt    Heart disease Paternal Aunt    Cancer Paternal Uncle    Heart disease Paternal Uncle    Heart disease Paternal Grandmother    Alzheimer's disease Paternal Grandfather    Stomach cancer Neg Hx    Rectal cancer Neg Hx    Esophageal cancer Neg Hx    Colon cancer Neg Hx     ALLERGIES:  is allergic to codeine, dilaudid [hydromorphone hcl], and lisinopril.  MEDICATIONS:  Current Outpatient Medications  Medication Sig Dispense Refill   clindamycin (CLEOCIN-T) 1 % lotion Apply to rash as needed. (Patient not taking: Reported on 11/21/2022) 60 mL 0    cyanocobalamin (VITAMIN B12) 1000 MCG tablet Take 1 tablet (1,000 mcg total) by mouth daily. 30 tablet 1   doxycycline (VIBRA-TABS) 100 MG tablet Take 1 tablet (100 mg total) by mouth 2 (two) times daily. 60 tablet 3   DULoxetine (CYMBALTA) 30 MG capsule Take 1 capsule (30 mg total) by mouth daily. (Patient not taking: Reported on 12/05/2022) 30 capsule 0   fluconazole (DIFLUCAN) 150 MG tablet Take 1 tablet (150 mg total) by mouth daily. 1 tablet 0   gabapentin (NEURONTIN) 300 MG capsule Take 1 capsule (300 mg total) by mouth at bedtime. (Patient not taking: Reported on 07/31/2022) 30 capsule 1   lidocaine-prilocaine (EMLA) cream Apply a quarter-sized amount to port a cath site and cover with plastic wrap one hour prior to infusion appointments (Patient not taking: Reported on 09/11/2022) 30 g 3   magnesium oxide (MAG-OX) 400 (240 Mg) MG tablet Take 1 tablet (400 mg total) by mouth daily. 30 tablet 0   ondansetron (ZOFRAN) 8 MG tablet Take 2 tablets (16 mg total) by mouth every 8 (eight) hours as needed for nausea or vomiting. (Patient not taking: Reported on 08/14/2022) 20 tablet 3   oxyCODONE ER (XTAMPZA ER) 36 MG C12A Take 1 capsule (36 mg total) by mouth every 12 (twelve) hours. 60 capsule 0   Oxycodone HCl 10 MG TABS Take 1 tablet (10 mg total) by mouth every 6 (six) hours as needed. 60 tablet 0   pantoprazole (PROTONIX) 20 MG tablet Take 1 tablet (20 mg total) by mouth daily. 90 tablet 3   polyethylene glycol (MIRALAX / GLYCOLAX) 17 g packet Take 17 g by mouth daily as needed for mild constipation. 14 each 0   potassium chloride (KLOR-CON M) 10 MEQ tablet Take 1 tablet (10 mEq total) by mouth daily. 30 tablet 0   senna-docusate (SENOKOT-S) 8.6-50 MG tablet Take 1 tablet by mouth 2 (two) times daily. 30 tablet 0   silver sulfADIAZINE (SILVADENE) 1 % cream Apply 1 Application topically daily. 50 g 3   No current facility-administered medications for this visit.    REVIEW OF SYSTEMS:    Constitutional: ( - ) fevers, ( - )  chills , ( - ) night sweats Eyes: ( - ) blurriness of vision, ( - ) double vision, ( - ) watery eyes Ears, nose, mouth, throat, and face: ( - ) mucositis, ( - ) sore throat Respiratory: ( - ) cough, ( - ) dyspnea, ( - ) wheezes Cardiovascular: ( - ) palpitation, ( - ) chest discomfort, ( - ) lower extremity swelling Gastrointestinal:  ( - ) nausea, ( - ) heartburn, ( - ) change in bowel habits Skin: ( + ) abnormal skin rashes Lymphatics: ( - ) new lymphadenopathy, ( - )  easy bruising Neurological: ( - ) numbness, ( - ) tingling, ( - ) new weaknesses Behavioral/Psych: ( - ) mood change, ( - ) new changes  All other systems were reviewed with the patient and are negative.  PHYSICAL EXAMINATION: ECOG PERFORMANCE STATUS: 2 - Symptomatic, <50% confined to bed  There were no vitals filed for this visit.    There were no vitals filed for this visit.     GENERAL: well appearing male in NAD. Exam performed in wheelchair.  SKIN: skin color, texture, turgor are normal,or significant lesions.   EYES: conjunctiva are pink and non-injected, sclera clear LUNGS: clear to auscultation and percussion with normal breathing effort HEART: regular rate & rhythm and no murmurs and no lower extremity edema Musculoskeletal: no cyanosis of digits and no clubbing  PSYCH: alert & oriented x 3, fluent speech NEURO: no focal motor/sensory deficits  LABORATORY DATA:  I have reviewed the data as listed    Latest Ref Rng & Units 01/01/2023   12:15 PM 12/18/2022    8:56 AM 12/05/2022    9:41 AM  CBC  WBC 4.0 - 10.5 K/uL 2.4  3.3  3.8   Hemoglobin 13.0 - 17.0 g/dL 8.9  8.7  8.7   Hematocrit 39.0 - 52.0 % 27.7  27.5  28.4   Platelets 150 - 400 K/uL 288  292  369        Latest Ref Rng & Units 01/01/2023   12:15 PM 12/18/2022    8:56 AM 12/05/2022    9:41 AM  CMP  Glucose 70 - 99 mg/dL 952  841  324   BUN 8 - 23 mg/dL 10  8  9    Creatinine 0.61 - 1.24 mg/dL 4.01   0.27  2.53   Sodium 135 - 145 mmol/L 137  137  139   Potassium 3.5 - 5.1 mmol/L 3.6  3.6  3.8   Chloride 98 - 111 mmol/L 103  103  105   CO2 22 - 32 mmol/L 28  26  26    Calcium 8.9 - 10.3 mg/dL 8.5  8.5  8.9   Total Protein 6.5 - 8.1 g/dL 5.9  6.0  6.1   Total Bilirubin <1.2 mg/dL 0.6  0.9  0.7   Alkaline Phos 38 - 126 U/L 129  171  291   AST 15 - 41 U/L 11  13  13    ALT 0 - 44 U/L 5  6  5       PATHOLOGY: Rectum, biopsy POORLY DIFFERENTIATED CARCINOMA. SEE NOTE. Diagnosis Note Addendum: Immunohistochemistry shows the carcinoma is positive with MOC-31, cytokeratin 20 and CDX2. Tumor is negative with cytokeratin 7, cytokeratin 5/6, p40, TTF-1, Napsin A, prostate-specific antigen and prostein. The immunophenotype is consistent with primary colorectal adenocarcinoma.  RADIOGRAPHIC STUDIES: No results found.  ASSESSMENT & PLAN KOURTLAND SHIEH is a 62 y.o. male who presents to the clinic for a follow up for rectal carcinoma.   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  --We reviewed that his cancer is incurable and mainstay treatment is chemotherapy. --Recommend chemotherapy regimen FOLFOX plus Bevacizumab q 2 weeks, started on 04/08/2022.  --progression noted on last PET/CT scan from 07/11/2022. D/c FOLFOX + Bev and start FOLFIRI + Panitumumab on 07/31/2022. PLAN: --Due for Cycle 10, Day 1 today --Labs from today reviewed and adequate for treatment. WBC  2.4, hemoglobin 8.9, MCV 90.8, platelets 288. Creatinine and LFTs adequate.  --Proceed with treatment today without any dose modifications. Continue Panitumumab today but dose reduced at 4 mg/kg.  --RTC in 2 weeks before Cycle 10, Day 1 of FOLFIRI + Panitumumab   #Acneiform rash-- resolved --Secondary to panitumumab --resumed panitumumab  dose reduced at 4 mg/kg.  --continue doxycycline 100 mg BID and Cleocin gel for spot treatment. Advised him the importance of taking both medications to minimize severity of rash.   #Microcytic anemia: #Thrombocytosis: --patient received IV venofer 200 mg once a week x 3  #Right shoulder pain # Leg Pain  # Bone Pain from Metastatic Cancer  --Right shoulder xray form 04/08/2022 showed destructive lesion within the proximal to mid right humerus shaft with acute to subacture pathologic fracture. --Patient underwent pinning of right humerus on 04/12/2022 --Received palliative radiation from 05/14/2022-06/03/2022 to right humerus, L3 spine and pelvis --Current pain regimen includes Xtampza 27 mg q 12 hours, oxycodone 5-10 mg q 6 hours for breakthrough pain.  He has not been requiring as needed oxy. --following with palliative care team.   #Supportive Care -- chemotherapy education complete -- port placement complete.  -- zofran 8mg  q8H PRN and compazine 10mg  PO q6H for nausea -- EMLA cream for port  Orders Placed This Encounter  Procedures   CT CHEST ABDOMEN PELVIS W CONTRAST    Standing Status:   Future    Standing Expiration Date:   01/12/2024    Order Specific Question:   If indicated for the ordered procedure, I authorize the administration of contrast media per Radiology protocol    Answer:   Yes    Order Specific Question:   Does the patient have a contrast media/X-ray dye allergy?    Answer:   No    Order Specific Question:   Preferred imaging location?    Answer:   [facility]    Order Specific Question:   If indicated for the ordered procedure, I authorize the administration of oral contrast media per Radiology protocol    Answer:   Yes    All questions were answered. The patient knows to call the clinic with any problems, questions or concerns.  I have spent a total of 30 minutes minutes of face-to-face and non-face-to-face time, preparing to see the patient, performing  a medically appropriate examination, counseling and educating the patient, ordering medications/tests/procedures,  documenting clinical information in the electronic health record,  and care coordination.    Ulysees Barns, MD Department of Hematology/Oncology [facility] at [facility] Phone: [phone] Pager: [phone] Email: [email or web address]

## 2023-01-01 NOTE — Progress Notes (Signed)
Ok to treat with MG of 1.5 per Dr. Leonides Schanz

## 2023-01-03 ENCOUNTER — Other Ambulatory Visit: Payer: Self-pay | Admitting: *Deleted

## 2023-01-03 ENCOUNTER — Inpatient Hospital Stay: Payer: Medicare HMO

## 2023-01-03 VITALS — BP 113/70 | HR 55 | Temp 98.1°F | Resp 18

## 2023-01-03 DIAGNOSIS — G893 Neoplasm related pain (acute) (chronic): Secondary | ICD-10-CM | POA: Diagnosis not present

## 2023-01-03 DIAGNOSIS — Z5111 Encounter for antineoplastic chemotherapy: Secondary | ICD-10-CM | POA: Diagnosis not present

## 2023-01-03 DIAGNOSIS — C20 Malignant neoplasm of rectum: Secondary | ICD-10-CM

## 2023-01-03 DIAGNOSIS — Z79899 Other long term (current) drug therapy: Secondary | ICD-10-CM | POA: Diagnosis not present

## 2023-01-03 DIAGNOSIS — D75839 Thrombocytosis, unspecified: Secondary | ICD-10-CM | POA: Diagnosis not present

## 2023-01-03 DIAGNOSIS — C7951 Secondary malignant neoplasm of bone: Secondary | ICD-10-CM | POA: Diagnosis not present

## 2023-01-03 DIAGNOSIS — Z5112 Encounter for antineoplastic immunotherapy: Secondary | ICD-10-CM | POA: Diagnosis not present

## 2023-01-03 DIAGNOSIS — D509 Iron deficiency anemia, unspecified: Secondary | ICD-10-CM | POA: Diagnosis not present

## 2023-01-03 DIAGNOSIS — Z510 Encounter for antineoplastic radiation therapy: Secondary | ICD-10-CM | POA: Diagnosis not present

## 2023-01-03 MED ORDER — HEPARIN SOD (PORK) LOCK FLUSH 100 UNIT/ML IV SOLN
500.0000 [IU] | Freq: Once | INTRAVENOUS | Status: AC | PRN
  Administered 2023-01-03: 500 [IU]

## 2023-01-03 MED ORDER — SODIUM CHLORIDE 0.9% FLUSH
10.0000 mL | INTRAVENOUS | Status: DC | PRN
Start: 2023-01-03 — End: 2023-01-03
  Administered 2023-01-03: 10 mL

## 2023-01-03 MED ORDER — SILVER SULFADIAZINE 1 % EX CREA: 1.0000 | TOPICAL_CREAM | Freq: Every day | CUTANEOUS | 3 refills | Status: DC

## 2023-01-06 DIAGNOSIS — E119 Type 2 diabetes mellitus without complications: Secondary | ICD-10-CM | POA: Diagnosis not present

## 2023-01-06 DIAGNOSIS — L89312 Pressure ulcer of right buttock, stage 2: Secondary | ICD-10-CM | POA: Diagnosis not present

## 2023-01-06 DIAGNOSIS — I10 Essential (primary) hypertension: Secondary | ICD-10-CM | POA: Diagnosis not present

## 2023-01-06 DIAGNOSIS — C20 Malignant neoplasm of rectum: Secondary | ICD-10-CM | POA: Diagnosis not present

## 2023-01-06 DIAGNOSIS — D500 Iron deficiency anemia secondary to blood loss (chronic): Secondary | ICD-10-CM | POA: Diagnosis not present

## 2023-01-06 DIAGNOSIS — C7951 Secondary malignant neoplasm of bone: Secondary | ICD-10-CM | POA: Diagnosis not present

## 2023-01-06 DIAGNOSIS — E785 Hyperlipidemia, unspecified: Secondary | ICD-10-CM | POA: Diagnosis not present

## 2023-01-06 DIAGNOSIS — G893 Neoplasm related pain (acute) (chronic): Secondary | ICD-10-CM | POA: Diagnosis not present

## 2023-01-06 DIAGNOSIS — D630 Anemia in neoplastic disease: Secondary | ICD-10-CM | POA: Diagnosis not present

## 2023-01-10 NOTE — Progress Notes (Unsigned)
Palliative Medicine [facility]  [phone] [phone]   Name: Joseph Hogan Date: 01/10/2023 MRN: [id number]  DOB: 1960-05-18  Patient Care Team: [name], MD as PCP - General (Hematology and Oncology) Doreatha Massed, MD as Medical Oncologist (Medical Oncology) Pickenpack-Cousar, Arty Baumgartner, NP as Nurse Practitioner [facility] and Palliative Medicine)   I connected with Joseph Hogan on 01/10/23 at 10:30 AM EST by phone and verified that I am speaking with the correct person using two identifiers.   I discussed the limitations, risks, security and privacy concerns of performing an evaluation and management service by telemedicine and the availability of in-person appointments. I also discussed with the patient that there may be a patient responsible charge related to this service. The patient expressed understanding and agreed to proceed.   Other persons participating in the visit and their role in the encounter: n/a   Patient's location: home  Provider's location: [facility]   Chief Complaint: f/u of symptom management    INTERVAL HISTORY: Joseph Hogan is a 62 y.o. 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 Palliative ask to see for symptom management and goals of care.   SOCIAL HISTORY:     reports that he has never smoked. He has never used smokeless tobacco. He reports that he does not drink alcohol and does not use drugs.  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  MEDICATIONS:  Current Outpatient Medications  Medication Sig Dispense Refill   clindamycin (CLEOCIN-T) 1 % lotion Apply to rash as needed. (Patient not taking: Reported on 11/21/2022) 60 mL 0   cyanocobalamin (VITAMIN B12) 1000 MCG tablet Take 1 tablet (1,000 mcg total) by mouth daily. 30 tablet 1   doxycycline (VIBRA-TABS) 100 MG tablet Take 1 tablet (100 mg total) by mouth 2 (two) times daily. 60 tablet 3   DULoxetine (CYMBALTA) 30 MG capsule Take 1 capsule (30 mg total) by mouth daily. (Patient not taking: Reported on 12/05/2022) 30 capsule 0   fluconazole (DIFLUCAN) 150 MG tablet Take 1 tablet (150 mg total) by mouth daily. 1 tablet 0   gabapentin (NEURONTIN) 300 MG capsule Take 1 capsule (300 mg total) by mouth at bedtime. (Patient not taking: Reported on 07/31/2022) 30 capsule 1   lidocaine-prilocaine (EMLA) cream Apply a quarter-sized amount to port a cath site and cover with plastic wrap one hour prior to infusion appointments (Patient not taking: Reported on 09/11/2022) 30 g 3   magnesium oxide (MAG-OX) 400 (240 Mg) MG tablet Take 1 tablet (400 mg total) by mouth daily. 30 tablet 0   ondansetron (ZOFRAN) 8 MG tablet Take 2 tablets (16 mg total) by mouth every 8 (eight) hours as needed for nausea or vomiting. (Patient not taking: Reported on 08/14/2022) 20 tablet 3   oxyCODONE ER (XTAMPZA ER) 36 MG C12A Take 1 capsule (36 mg total) by mouth every 12 (twelve) hours. 60 capsule 0  Oxycodone HCl 10 MG TABS Take 1 tablet (10 mg total) by mouth every 6 (six) hours as needed. 60 tablet 0   pantoprazole (PROTONIX) 20 MG tablet Take 1 tablet (20 mg total) by mouth daily. 90 tablet 3   polyethylene glycol (MIRALAX / GLYCOLAX) 17 g packet Take 17 g by mouth daily as needed for mild constipation. 14 each 0   potassium chloride (KLOR-CON M) 10 MEQ tablet Take 1 tablet (10 mEq total) by mouth daily. 30  tablet 0   senna-docusate (SENOKOT-S) 8.6-50 MG tablet Take 1 tablet by mouth 2 (two) times daily. 30 tablet 0   silver sulfADIAZINE (SILVADENE) 1 % cream Apply 1 Application topically daily. 50 g 3   No current facility-administered medications for this visit.    VITAL SIGNS: There were no vitals taken for this visit. There were no vitals filed for this visit.  Estimated body mass index is 24 kg/m as calculated from the following:   Height as of 01/01/23: 5\' 11"  (1.803 m).   Weight as of 01/01/23: 172 lb 1.3 oz (78.1 kg).   PERFORMANCE STATUS (ECOG) : 2 - Symptomatic, <50% confined to bed  Discussed the use of AI scribe software for clinical note transcription with the patient, who gave verbal consent to proceed.   IMPRESSION:  Joseph Hogan presents to clinic for symptom management follow-up. No acute distress. His Hogan is present. Patient is in a wheelchair. Skin continues to be dry. He reports no pain and describes the sensation in his right hip as an occasional 'tingle.' He has been managing his medications well, with a noted decrease in his supply of a 12-hour medication, which will be refilled. He is much appreciative in his improvement in pain.  Joseph Hogan has his pain medication bottles with him today. All medications reviewed. He is taking as prescribed. He has his oxycodone 10mg  breakthrough medication which he has used sparingly when needed.  Reports sleeping well, typically from 10pm to around 9 or 10am. He experienced a bout of diarrhea lasting four to five days, which has since resolved. During this period, he took two painkillers, which seemed to help stop the diarrhea. His appetite has been fluctuating, typically decreasing around the time of his treatments, but it has been improving in the last two days. We discussed the need to increase his appetite. Current weight is 180lbs down from 191lbs on 10/24, 209lbs on 10/16. We will connect him with a dietician for additional support.    We reviewed patient's scheduled for regular treatments and radiation, with a long treatment day including a doctor's appointment coming up. He has a pump that is typically removed on Fridays. Hogan verbalized understanding of calendar of events.  Goals of Care  11/07/22- We discussed his current illness and what it means in the larger context of his on-going co-morbidities. Natural disease trajectory and expectations were discussed.   Patient's Hogan verbalized understanding of his father's current illness. Patient also able to acknowledge some form of understanding but with some obvious signs of confusion when it comes to memory recall. He mentions his children assist with most of his medical needs.    I empathetically approached discussions regarding healthcare limitations including code status and advanced directives. Hogan, Garrick states they are in the process of completing documents. Joseph Hogan (patient) confirms. They verbalize patient's Hogan Drake Hutchings is primary medical decision maker. They request patient to remain full code with full scope care. Mr. Domen and Hogan are clear in expressed wishes  to continue to treat the treatable allowing patient every opportunity to continue to do as good as he can for as long as he can while managing his symptoms.     We discussed Her current illness and what it means in the larger context of Her on-going co-morbidities. Natural disease trajectory and expectations were discussed.  I discussed the importance of continued conversation with family and their medical providers regarding overall plan of care and treatment options, ensuring decisions are within the context of the patients values and GOCs.  PLAN:  Pain Management Pain well controlled with current regimen. Patient has been taking Oxycodone 10mg  as needed for breakthrough pain.  -Continue Oxycodone 10mg  as needed for pain. Marlowe Kays 36mg  every 12 hours.  -His daughter manages weekly pill container    Medication Refill Patient's medication is running low. -Send refill to local pharmacy.  Gastrointestinal Issues Patient experienced diarrhea for several days, which has now resolved. -Monitor symptoms.  Appetite Decreased appetite post-treatment, improving as next treatment approaches. -Encourage patient to continue working on improving appetite. -Weight significantly decreased over the past month.  -Dietician referral   Cancer Treatment Patient is undergoing radiation therapy and has chemotherapy scheduled. -Continue with current treatment plan. -Next chemotherapy session scheduled for 12/18/2022.  Follow-up Patient to return for treatment and doctor's appointment on 12/18/2022. -Will follow-up in clinic on 12/26/2022. Patient expressed understanding and was in agreement with this plan. He also understands that He can call the clinic at any time with any questions, concerns, or complaints.   Any controlled substances utilized were prescribed in the context of palliative care. PDMP has been reviewed.   Visit consisted of counseling and education dealing with the complex and emotionally intense issues of symptom management and palliative care in the setting of serious and potentially life-threatening illness.  Willette Alma, AGPCNP-BC  Palliative Medicine Team/[facility]  *Please note that this is a verbal dictation therefore any spelling or grammatical errors are due to the "[employer]agon [employer] One" system interpretation.

## 2023-01-12 ENCOUNTER — Encounter: Payer: Self-pay | Admitting: Hematology and Oncology

## 2023-01-13 DIAGNOSIS — E785 Hyperlipidemia, unspecified: Secondary | ICD-10-CM | POA: Diagnosis not present

## 2023-01-13 DIAGNOSIS — D500 Iron deficiency anemia secondary to blood loss (chronic): Secondary | ICD-10-CM | POA: Diagnosis not present

## 2023-01-13 DIAGNOSIS — C20 Malignant neoplasm of rectum: Secondary | ICD-10-CM | POA: Diagnosis not present

## 2023-01-13 DIAGNOSIS — D630 Anemia in neoplastic disease: Secondary | ICD-10-CM | POA: Diagnosis not present

## 2023-01-13 DIAGNOSIS — C7951 Secondary malignant neoplasm of bone: Secondary | ICD-10-CM | POA: Diagnosis not present

## 2023-01-13 DIAGNOSIS — L89312 Pressure ulcer of right buttock, stage 2: Secondary | ICD-10-CM | POA: Diagnosis not present

## 2023-01-13 DIAGNOSIS — E119 Type 2 diabetes mellitus without complications: Secondary | ICD-10-CM | POA: Diagnosis not present

## 2023-01-13 DIAGNOSIS — G893 Neoplasm related pain (acute) (chronic): Secondary | ICD-10-CM | POA: Diagnosis not present

## 2023-01-13 DIAGNOSIS — I10 Essential (primary) hypertension: Secondary | ICD-10-CM | POA: Diagnosis not present

## 2023-01-16 ENCOUNTER — Inpatient Hospital Stay: Payer: Medicare HMO | Attending: Radiation Oncology

## 2023-01-16 ENCOUNTER — Telehealth: Payer: Self-pay

## 2023-01-16 ENCOUNTER — Inpatient Hospital Stay: Payer: Medicare HMO | Admitting: Nurse Practitioner

## 2023-01-16 ENCOUNTER — Other Ambulatory Visit: Payer: Medicare HMO

## 2023-01-16 DIAGNOSIS — Z5112 Encounter for antineoplastic immunotherapy: Secondary | ICD-10-CM | POA: Insufficient documentation

## 2023-01-16 DIAGNOSIS — C20 Malignant neoplasm of rectum: Secondary | ICD-10-CM | POA: Insufficient documentation

## 2023-01-16 DIAGNOSIS — Z5111 Encounter for antineoplastic chemotherapy: Secondary | ICD-10-CM | POA: Insufficient documentation

## 2023-01-16 NOTE — Telephone Encounter (Signed)
Patient had a 930 port flush scheduled. He has not showed for appointment. I called the patient no answer left a message to call us back if he is not coming or he is running late. Made infusion aware.

## 2023-01-16 NOTE — Progress Notes (Unsigned)
Patient Care Team: [name], MD as PCP - General (Hematology and Oncology) Doreatha Massed, MD as Medical Oncologist (Medical Oncology) Pickenpack-Cousar, Arty Baumgartner, NP as Nurse Practitioner (Hospice and Palliative Medicine)   CHIEF COMPLAINT: Follow-up metastatic rectal cancer  Oncology History  Rectal carcinoma (HCC)  03/19/2022 Initial Diagnosis   Rectal carcinoma (HCC)   03/19/2022 Cancer Staging   Staging form: Colon and Rectum, AJCC 8th Edition - Clinical stage from 03/19/2022: Stage IVB (cTX, cN2b, cM1b) - Signed by [name], MD on 05/21/2022 Stage prefix: Initial diagnosis   04/08/2022 - 07/04/2022 Chemotherapy   Patient is on Treatment Plan : COLORECTAL FOLFOX + Bevacizumab q14d     07/31/2022 -  Chemotherapy   Patient is on Treatment Plan : COLORECTAL FOLFIRI + Panitumumab q14d        CURRENT THERAPY: FOLFIRI/panitumumab q14 days starting 07/30/2022, dose reduced for rash   INTERVAL HISTORY Mr. Limbrick returns for follow-up and treatment as scheduled.  He completed radiation 11/15. Last seen by Dr. Leonides Schanz 01/01/2023 with cycle 10 FOLFIRI/Vectibix.  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   with abnormal R wave progression noted on EKG   MRSA (methicillin resistant Staphylococcus aureus)    Pre-diabetes    Rectal carcinoma (HCC) 03/19/2022     Past Surgical History:  Procedure Laterality Date   COLONOSCOPY     HUMERUS IM NAIL Right 04/12/2022   Procedure: INTRAMEDULLARY (IM) NAIL HUMERAL;  Surgeon: Bjorn Pippin, MD;  Location: [facility];  Service: Orthopedics;  Laterality: Right;   IR IMAGING GUIDED PORT INSERTION  04/05/2022   TOTAL HIP ARTHROPLASTY Left 02/06/2022   Procedure: TOTAL HIP ARTHROPLASTY;  Surgeon: Joen Laura, MD;  Location: [facility];  Service: Orthopedics;  Laterality: Left;     Outpatient Encounter Medications as of  01/16/2023  Medication Sig   clindamycin (CLEOCIN-T) 1 % lotion Apply to rash as needed. (Patient not taking: Reported on 11/21/2022)   cyanocobalamin (VITAMIN B12) 1000 MCG tablet Take 1 tablet (1,000 mcg total) by mouth daily.   doxycycline (VIBRA-TABS) 100 MG tablet Take 1 tablet (100 mg total) by mouth 2 (two) times daily.   DULoxetine (CYMBALTA) 30 MG capsule Take 1 capsule (30 mg total) by mouth daily. (Patient not taking: Reported on 12/05/2022)   fluconazole (DIFLUCAN) 150 MG tablet Take 1 tablet (150 mg total) by mouth daily.   gabapentin (NEURONTIN) 300 MG capsule Take 1 capsule (300 mg total) by mouth at bedtime. (Patient not taking: Reported on 07/31/2022)   lidocaine-prilocaine (EMLA) cream Apply a quarter-sized amount to port a cath site and cover with plastic wrap one hour prior to infusion appointments (Patient not taking: Reported on 09/11/2022)   magnesium oxide (MAG-OX) 400 (240 Mg) MG tablet Take 1 tablet (400 mg total) by mouth daily.   ondansetron (ZOFRAN) 8 MG tablet Take 2 tablets (16 mg total) by mouth every 8 (eight) hours as needed for nausea or vomiting. (Patient not taking: Reported on 08/14/2022)   oxyCODONE ER (XTAMPZA ER) 36 MG C12A Take 1 capsule (36 mg total) by mouth every 12 (twelve) hours.   Oxycodone HCl 10 MG TABS Take 1 tablet (10 mg total) by mouth every 6 (six) hours as needed.   pantoprazole (PROTONIX) 20 MG tablet Take 1 tablet (20 mg total) by mouth daily.   polyethylene glycol (  MIRALAX / GLYCOLAX) 17 g packet Take 17 g by mouth daily as needed for mild constipation.   potassium chloride (KLOR-CON M) 10 MEQ tablet Take 1 tablet (10 mEq total) by mouth daily.   senna-docusate (SENOKOT-S) 8.6-50 MG tablet Take 1 tablet by mouth 2 (two) times daily.   silver sulfADIAZINE (SILVADENE) 1 % cream Apply 1 Application topically daily.   No facility-administered encounter medications on file as of 01/16/2023.     There were no vitals filed for this visit. There is  no height or weight on file to calculate BMI.   PHYSICAL EXAM GENERAL:alert, no distress and comfortable SKIN: no rash  EYES: sclera clear NECK: without mass LYMPH:  no palpable cervical or supraclavicular lymphadenopathy  LUNGS: clear with normal breathing effort HEART: regular rate & rhythm, no lower extremity edema ABDOMEN: abdomen soft, non-tender and normal bowel sounds NEURO: alert & oriented x 3 with fluent speech, no focal motor/sensory deficits Breast exam:  PAC without erythema    CBC    Component Value Date/Time   WBC 2.4 (L) 01/01/2023 1215   WBC 8.2 11/05/2022 1400   RBC 3.05 (L) 01/01/2023 1215   HGB 8.9 (L) 01/01/2023 1215   HCT 27.7 (L) 01/01/2023 1215   PLT 288 01/01/2023 1215   MCV 90.8 01/01/2023 1215   MCH 29.2 01/01/2023 1215   MCHC 32.1 01/01/2023 1215   RDW 17.6 (H) 01/01/2023 1215   LYMPHSABS 0.3 (L) 01/01/2023 1215   MONOABS 0.4 01/01/2023 1215   EOSABS 0.1 01/01/2023 1215   BASOSABS 0.0 01/01/2023 1215     CMP     Component Value Date/Time   NA 137 01/01/2023 1215   K 3.6 01/01/2023 1215   CL 103 01/01/2023 1215   CO2 28 01/01/2023 1215   GLUCOSE 114 (H) 01/01/2023 1215   BUN 10 01/01/2023 1215   CREATININE 0.56 (L) 01/01/2023 1215   CREATININE 0.77 06/04/2016 1259   CALCIUM 8.5 (L) 01/01/2023 1215   PROT 5.9 (L) 01/01/2023 1215   ALBUMIN 3.3 (L) 01/01/2023 1215   AST 11 (L) 01/01/2023 1215   ALT <5 01/01/2023 1215   ALKPHOS 129 (H) 01/01/2023 1215   BILITOT 0.6 01/01/2023 1215   GFRNONAA >60 01/01/2023 1215   GFRNONAA >89 04/08/2015 0837   GFRAA >60 10/13/2019 1333   GFRAA >89 04/08/2015 0837     ASSESSMENT & PLAN:62 yo male with  # Metastatic Rectal Adenocarcinoma 02/03/2022-02/11/2022: Presented to ED due to left hip pain after a mechanical fall.  02/04/2022: CT left BMW:UXLKG pathologic fracture of the left femoral neck with displacement and angulation, with underlying lytic lesions in the femoral head and neck.Additional  lytic destructive lesion of the left parasymphyseal pubic bone extending throughout the superior pubic ramus to the puboacetabular junction, with pathologic fracture of the superior pubic ramus. Adjacent hypoattenuation in the pelvic component of the obturator internus muscle, suspicious for tumor involvement. Probable small lytic lesion in the inferior pubic ramus.Prominent left external iliac lymph nodes measuring up to 1.2 cm. 02/05/2022: CT CAP: Pathologic retroperitoneal and bilateral pelvic adenopathy, with lytic destructive lesions of the left superior and inferior pubic ramus and pubic body as well as a pathologic fracture through the left femoral neck. Pathologic fracture anteriorly in the right second rib. Deformity anteriorly in the right third rib probably from early pathologic fracture. Metastatic disease or myeloma strongly favored over multifocal osteomyelitis as a cause. Mildly enlarged left supraclavicular lymph node and lower thoracic periaortic lymph node. Pathologic retroperitoneal and  pelvic adenopathy. 10 cm long segment of rectal wall thickening.1.1 cm exophytic lesion from the left mid kidney posterolaterally, nonspecific for complex cyst versus tumor. Bandlike atelectasis in both lungs.Small type 1 hiatal hernia.Lumbar spondylosis and degenerative disc disease causing generally mild multilevel impingement. Sigmoid colon diverticulosis.Wall thickening in the proximal stomach body, probably secondary to nondistention. 02/06/2022: Underwent left total hip arthroplasty. Pathology revealed poorly differentiated carcinoma.  SPEP/IFE did not detect monoclonal protein. PSA normal. 02/25/2022: Underwent colonoscopy that showed malignant tumor in the rectum. Pathology confirmed poorly differentiated carcinoma.  04/05/2022: Underwent port placement 04/08/2022: Cycle 1, Day 1 of FOLFOX (held bevacizumab due to recent port placement) 04/23/2022: Cycle 2, Day 1 of FOLFOX plus bevacizumab 05/07/2022:  Cycle 3, Day 1 of FOLFOX plus bevacizumab 05/21/2022: Cycle 4, Day 1 of FOLFOX plus bevacizumab. Transitioned care to Dr. Caren Hazy at [facility] 05/14/22 - 06/03/22 palliative radiation per Dr. Mitzi Hansen to R humerus, lumbar spine, and left pelvis  07/02/2022: Cycle 6, Day 1 of FOLFOX plus bevacizumab at [facility].  07/11/2022: NM PET CT scan showed progression of hypermetabolic osseous metastatic disease, although there is some mixed change as detailed. New hypermetabolic right femoral neck metastasis places the patient at risk for pathologic right femoral neck fracture. 07/30/2022:  Cycle 1 Day 1  FOLFIRI +Panitumumab 08/14/2022: Cycle 2 Day 1 FOLFIRI + Panitumumab 08/27/2022: Cycle 3 Day 1 FOLFIRI + Panitumumab 09/11/2022: Cycle 4 Day 1 FOLFIRI (Hold Panitumumab due to acneiform rash) 10/02/2022: Cycle 5 Day 1 FOLFIRI (Hold Panitumumab due to acneiform rash) 11/07/2022: Cycle 6 Day 1 FOLFIRI plus Panitumumab dose reduced at 4 mg/kg.  11/21/2022: cycle 7 day 1 FOLFIRI plus dose reduced panitumumab 4 mg/kg. Start oral K/Mg 12/05/2022 Cycle 8 day 1 FOLFIRI/Panitumumab dose reduced  Palliative radiation to R pelvis 12/09/22 - 12/27/22 by Dr. Mitzi Hansen 12/18/2022 cycle 9 day 1  01/01/2023 cycle 10 day 1     PLAN:  No orders of the defined types were placed in this encounter.     All questions were answered. The patient knows to call the clinic with any problems, questions or concerns. No barriers to learning were detected. I spent *** counseling the patient face to face. The total time spent in the appointment was *** and more than 50% was on counseling, review of test results, and coordination of care.   Santiago Glad, NP-C @DATE @

## 2023-01-17 ENCOUNTER — Telehealth: Payer: Self-pay | Admitting: *Deleted

## 2023-01-17 NOTE — Telephone Encounter (Signed)
TCT patient regarding his appts for yesterday. Pt did not show and we were unable to contact him or his son yesterday. No answer today either. VM message left for pt to call back to let us know that he is ok. TCT patient's son, Nicolino, Marvel.  Was able to speak with pt's son. Steward Drone said that his dad is ok. Apparently his dad had wanted to reschedule but never called [name] or messaged [name] through MyChart. Advised that we will just see him at his next scheduled appt on 01/30/23. Steward Drone voiced understanding and will let his dad know.

## 2023-01-18 ENCOUNTER — Inpatient Hospital Stay: Payer: Medicare HMO

## 2023-01-21 DIAGNOSIS — E119 Type 2 diabetes mellitus without complications: Secondary | ICD-10-CM | POA: Diagnosis not present

## 2023-01-21 DIAGNOSIS — E785 Hyperlipidemia, unspecified: Secondary | ICD-10-CM | POA: Diagnosis not present

## 2023-01-21 DIAGNOSIS — G893 Neoplasm related pain (acute) (chronic): Secondary | ICD-10-CM | POA: Diagnosis not present

## 2023-01-21 DIAGNOSIS — C20 Malignant neoplasm of rectum: Secondary | ICD-10-CM | POA: Diagnosis not present

## 2023-01-21 DIAGNOSIS — D500 Iron deficiency anemia secondary to blood loss (chronic): Secondary | ICD-10-CM | POA: Diagnosis not present

## 2023-01-21 DIAGNOSIS — C7951 Secondary malignant neoplasm of bone: Secondary | ICD-10-CM | POA: Diagnosis not present

## 2023-01-21 DIAGNOSIS — I10 Essential (primary) hypertension: Secondary | ICD-10-CM | POA: Diagnosis not present

## 2023-01-21 DIAGNOSIS — D630 Anemia in neoplastic disease: Secondary | ICD-10-CM | POA: Diagnosis not present

## 2023-01-21 DIAGNOSIS — L89312 Pressure ulcer of right buttock, stage 2: Secondary | ICD-10-CM | POA: Diagnosis not present

## 2023-01-27 ENCOUNTER — Ambulatory Visit
Admission: RE | Admit: 2023-01-27 | Discharge: 2023-01-27 | Disposition: A | Discharge status: Home / Self Care | Payer: Medicare HMO | Source: Ambulatory Visit | Attending: Radiation Oncology | Admitting: Radiation Oncology

## 2023-01-27 DIAGNOSIS — Z510 Encounter for antineoplastic radiation therapy: Secondary | ICD-10-CM | POA: Insufficient documentation

## 2023-01-27 DIAGNOSIS — C20 Malignant neoplasm of rectum: Secondary | ICD-10-CM | POA: Insufficient documentation

## 2023-01-27 DIAGNOSIS — C7951 Secondary malignant neoplasm of bone: Secondary | ICD-10-CM | POA: Insufficient documentation

## 2023-01-27 NOTE — Progress Notes (Signed)
  Radiation Oncology         [phone]) [phone] ________________________________  Name: Joseph Hogan MRN: [id number]  Date of Service: 01/27/2023  DOB: 07/07/1960  Post Treatment Telephone Note  Diagnosis:  Stage IV poorly differentiated adenocarcinoma of the rectum with bone metastases (as documented in provider EOT note)  The patient was available for call today.  The patient did note fatigue during radiation but is slowly improving. The patient did not note skin changes in the field of radiation during therapy. The patient has not noticed improvement in pain in the area(s) treated with radiation and is still having bilateral leg, thigh, calf pain 7/10. Dr. Leonides Schanz is aware of this. The patient is not taking dexamethasone. The patient does have symptoms of  weakness in his legs. He is currently walking w/ a walker that only helps mildly. The patient does not have symptoms of headache. The patient does not have symptoms of seizure or uncontrolled movement. The patient does not have symptoms of changes in vision. The patient does not have changes in speech. The patient does not have confusion.   The patient is scheduled for ongoing care with Dr. Leonides Schanz in medical oncology. The patient was encouraged to call if he develops concerns or questions regarding radiation.   This concludes the interaction.  Ruel Favors, LPN

## 2023-01-28 DIAGNOSIS — I10 Essential (primary) hypertension: Secondary | ICD-10-CM | POA: Diagnosis not present

## 2023-01-28 DIAGNOSIS — D630 Anemia in neoplastic disease: Secondary | ICD-10-CM | POA: Diagnosis not present

## 2023-01-28 DIAGNOSIS — C20 Malignant neoplasm of rectum: Secondary | ICD-10-CM | POA: Diagnosis not present

## 2023-01-28 DIAGNOSIS — E785 Hyperlipidemia, unspecified: Secondary | ICD-10-CM | POA: Diagnosis not present

## 2023-01-28 DIAGNOSIS — L89312 Pressure ulcer of right buttock, stage 2: Secondary | ICD-10-CM | POA: Diagnosis not present

## 2023-01-28 DIAGNOSIS — G893 Neoplasm related pain (acute) (chronic): Secondary | ICD-10-CM | POA: Diagnosis not present

## 2023-01-28 DIAGNOSIS — E119 Type 2 diabetes mellitus without complications: Secondary | ICD-10-CM | POA: Diagnosis not present

## 2023-01-28 DIAGNOSIS — C7951 Secondary malignant neoplasm of bone: Secondary | ICD-10-CM | POA: Diagnosis not present

## 2023-01-28 DIAGNOSIS — D500 Iron deficiency anemia secondary to blood loss (chronic): Secondary | ICD-10-CM | POA: Diagnosis not present

## 2023-01-30 ENCOUNTER — Inpatient Hospital Stay: Payer: Medicare HMO

## 2023-01-30 ENCOUNTER — Other Ambulatory Visit: Payer: Self-pay | Admitting: Nurse Practitioner

## 2023-01-30 ENCOUNTER — Other Ambulatory Visit (HOSPITAL_COMMUNITY): Payer: Self-pay

## 2023-01-30 ENCOUNTER — Other Ambulatory Visit: Payer: Self-pay | Admitting: *Deleted

## 2023-01-30 ENCOUNTER — Inpatient Hospital Stay: Payer: Medicare HMO | Admitting: Hematology and Oncology

## 2023-01-30 VITALS — Wt 177.0 lb

## 2023-01-30 VITALS — BP 152/95 | HR 86 | Temp 98.7°F | Resp 16

## 2023-01-30 DIAGNOSIS — Z95828 Presence of other vascular implants and grafts: Secondary | ICD-10-CM

## 2023-01-30 DIAGNOSIS — C20 Malignant neoplasm of rectum: Secondary | ICD-10-CM | POA: Diagnosis not present

## 2023-01-30 DIAGNOSIS — G893 Neoplasm related pain (acute) (chronic): Secondary | ICD-10-CM

## 2023-01-30 DIAGNOSIS — Z5111 Encounter for antineoplastic chemotherapy: Secondary | ICD-10-CM | POA: Diagnosis not present

## 2023-01-30 DIAGNOSIS — C7951 Secondary malignant neoplasm of bone: Secondary | ICD-10-CM

## 2023-01-30 DIAGNOSIS — Z515 Encounter for palliative care: Secondary | ICD-10-CM

## 2023-01-30 DIAGNOSIS — Z5112 Encounter for antineoplastic immunotherapy: Secondary | ICD-10-CM | POA: Diagnosis not present

## 2023-01-30 LAB — CMP (CANCER CENTER ONLY)
ALT: <5 U/L (ref 0–44)
AST: 12 U/L — ABNORMAL LOW (ref 15–41)
Albumin: 2.9 g/dL — ABNORMAL LOW (ref 3.5–5.0)
Alkaline Phosphatase: 155 U/L — ABNORMAL HIGH (ref 38–126)
Anion gap: 7 (ref 5–15)
BUN: 12 mg/dL (ref 8–23)
CO2: 29 mmol/L (ref 22–32)
Calcium: 8.6 mg/dL — ABNORMAL LOW (ref 8.9–10.3)
Chloride: 103 mmol/L (ref 98–111)
Creatinine: 0.59 mg/dL — ABNORMAL LOW (ref 0.61–1.24)
GFR, Estimated: >60 mL/min (ref >60)
Glucose, Bld: 108 mg/dL — ABNORMAL HIGH (ref 70–99)
Potassium: 4.2 mmol/L (ref 3.5–5.1)
Sodium: 139 mmol/L (ref 135–145)
Total Bilirubin: 0.5 mg/dL (ref <1.2)
Total Protein: 5.8 g/dL — ABNORMAL LOW (ref 6.5–8.1)

## 2023-01-30 LAB — CBC WITH DIFFERENTIAL (CANCER CENTER ONLY)
Abs Immature Granulocytes: 0.06 10*3/uL (ref 0.00–0.07)
Basophils Absolute: 0 10*3/uL (ref 0.0–0.1)
Basophils Relative: 0 %
Eosinophils Absolute: 0 10*3/uL (ref 0.0–0.5)
Eosinophils Relative: 0 %
HCT: 27.8 % — ABNORMAL LOW (ref 39.0–52.0)
Hemoglobin: 8.9 g/dL — ABNORMAL LOW (ref 13.0–17.0)
Immature Granulocytes: 1 %
Lymphocytes Relative: 6 %
Lymphs Abs: 0.4 10*3/uL — ABNORMAL LOW (ref 0.7–4.0)
MCH: 28.6 pg (ref 26.0–34.0)
MCHC: 32 g/dL (ref 30.0–36.0)
MCV: 89.4 fL (ref 80.0–100.0)
Monocytes Absolute: 0.8 10*3/uL (ref 0.1–1.0)
Monocytes Relative: 11 %
Neutro Abs: 5.7 10*3/uL (ref 1.7–7.7)
Neutrophils Relative %: 82 %
Platelet Count: 411 10*3/uL — ABNORMAL HIGH (ref 150–400)
RBC: 3.11 MIL/uL — ABNORMAL LOW (ref 4.22–5.81)
RDW: 15.5 % (ref 11.5–15.5)
WBC Count: 7.1 10*3/uL (ref 4.0–10.5)
nRBC: 0 % (ref 0.0–0.2)

## 2023-01-30 LAB — MAGNESIUM: Magnesium: 1.7 mg/dL (ref 1.7–2.4)

## 2023-01-30 MED ORDER — SODIUM CHLORIDE 0.9 % IV SOLN
400.0000 mg/m2 | Freq: Once | INTRAVENOUS | Status: AC
  Administered 2023-01-30: 848 mg via INTRAVENOUS
  Filled 2023-01-30: qty 42.4

## 2023-01-30 MED ORDER — DEXAMETHASONE SODIUM PHOSPHATE 10 MG/ML IJ SOLN
10.0000 mg | Freq: Once | INTRAMUSCULAR | Status: AC
  Administered 2023-01-30: 10 mg via INTRAVENOUS
  Filled 2023-01-30: qty 1

## 2023-01-30 MED ORDER — SODIUM CHLORIDE 0.9% FLUSH
10.0000 mL | INTRAVENOUS | Status: DC | PRN
Start: 2023-01-30 — End: 2023-01-30

## 2023-01-30 MED ORDER — ATROPINE SULFATE 1 MG/ML IV SOLN
0.5000 mg | Freq: Once | INTRAVENOUS | Status: AC | PRN
  Administered 2023-01-30: 0.5 mg via INTRAVENOUS
  Filled 2023-01-30: qty 1

## 2023-01-30 MED ORDER — FLUOROURACIL CHEMO INJECTION 2.5 GM/50ML
400.0000 mg/m2 | Freq: Once | INTRAVENOUS | Status: AC
  Administered 2023-01-30: 850 mg via INTRAVENOUS
  Filled 2023-01-30: qty 17

## 2023-01-30 MED ORDER — PANITUMUMAB CHEMO INJECTION 100 MG/5ML
300.0000 mg | Freq: Once | INTRAVENOUS | Status: AC
  Administered 2023-01-30: 300 mg via INTRAVENOUS
  Filled 2023-01-30: qty 15

## 2023-01-30 MED ORDER — SODIUM CHLORIDE 0.9 % IV SOLN
2400.0000 mg/m2 | INTRAVENOUS | Status: DC
  Administered 2023-01-30: 5000 mg via INTRAVENOUS
  Filled 2023-01-30: qty 100

## 2023-01-30 MED ORDER — HYDROMORPHONE HCL 1 MG/ML IJ SOLN: 2.0000 mg | Freq: Once | INTRAMUSCULAR | Status: DC

## 2023-01-30 MED ORDER — SODIUM CHLORIDE 0.9 % IV SOLN: Freq: Once | INTRAVENOUS | Status: AC

## 2023-01-30 MED ORDER — HYDROMORPHONE HCL 1 MG/ML IJ SOLN
2.0000 mg | Freq: Once | INTRAMUSCULAR | Status: AC
  Administered 2023-01-30: 2 mg via INTRAVENOUS
  Filled 2023-01-30: qty 2

## 2023-01-30 MED ORDER — PALONOSETRON HCL INJECTION 0.25 MG/5ML
0.2500 mg | Freq: Once | INTRAVENOUS | Status: AC
  Administered 2023-01-30: 0.25 mg via INTRAVENOUS
  Filled 2023-01-30: qty 5

## 2023-01-30 MED ORDER — OXYCODONE HCL 10 MG PO TABS
10.0000 mg | ORAL_TABLET | Freq: Four times a day (QID) | ORAL | 0 refills | Status: DC | PRN
  Filled 2023-01-30: qty 60, 15d supply, fill #0

## 2023-01-30 MED ORDER — XTAMPZA ER 36 MG PO C12A
36.0000 mg | EXTENDED_RELEASE_CAPSULE | Freq: Two times a day (BID) | ORAL | 0 refills | Status: DC
  Filled 2023-01-30: qty 60, 30d supply, fill #0

## 2023-01-30 MED ORDER — SODIUM CHLORIDE 0.9% FLUSH
10.0000 mL | Freq: Once | INTRAVENOUS | Status: AC | PRN
  Administered 2023-01-30: 10 mL

## 2023-01-30 MED ORDER — SODIUM CHLORIDE 0.9 % IV SOLN
180.0000 mg/m2 | Freq: Once | INTRAVENOUS | Status: AC
  Administered 2023-01-30: 400 mg via INTRAVENOUS
  Filled 2023-01-30: qty 15

## 2023-01-30 MED ORDER — HEPARIN SOD (PORK) LOCK FLUSH 100 UNIT/ML IV SOLN: 500.0000 [IU] | Freq: Once | INTRAVENOUS | Status: DC | PRN

## 2023-01-30 NOTE — Progress Notes (Signed)
[facility] [phone]   [phone]  PROGRESS NOTE  Patient Care Team: [name], MD as PCP - General (Hematology and Oncology) Doreatha Massed, MD as Medical Oncologist (Medical Oncology) Pickenpack-Cousar, Arty Baumgartner, NP as Nurse Practitioner (Hospice and Palliative Medicine)  DIAGNOSIS: Metastatic rectal carcinoma  ONCOLOGIC HISTORY: # Metastatic Rectal Adenocarcinoma 02/03/2022-02/11/2022: Presented to ED due to left hip pain after a mechanical fall.  02/04/2022: CT left EXB:MWUXL pathologic fracture of the left femoral neck with displacement and angulation, with underlying lytic lesions in the femoral head and neck.Additional lytic destructive lesion of the left parasymphyseal pubic bone extending throughout the superior pubic ramus to the puboacetabular junction, with pathologic fracture of the superior pubic ramus. Adjacent hypoattenuation in the pelvic component of the obturator internus muscle, suspicious for tumor involvement. Probable small lytic lesion in the inferior pubic ramus.Prominent left external iliac lymph nodes measuring up to 1.2 cm. 02/05/2022: CT CAP: Pathologic retroperitoneal and bilateral pelvic adenopathy, with lytic destructive lesions of the left superior and inferior pubic ramus and pubic body as well as a pathologic fracture through the left femoral neck. Pathologic fracture anteriorly in the right second rib. Deformity anteriorly in the right third rib probably from early pathologic fracture. Metastatic disease or myeloma strongly favored over multifocal osteomyelitis as a cause. Mildly enlarged left supraclavicular lymph node and lower thoracic periaortic lymph node. Pathologic retroperitoneal and pelvic adenopathy. 10 cm long segment of rectal wall thickening.1.1 cm exophytic lesion from the left mid kidney posterolaterally, nonspecific for complex cyst versus tumor. Bandlike atelectasis in both lungs.Small type 1  hiatal hernia.Lumbar spondylosis and degenerative disc disease causing generally mild multilevel impingement. Sigmoid colon diverticulosis.Wall thickening in the proximal stomach body, probably secondary to nondistention. 02/06/2022: Underwent left total hip arthroplasty. Pathology revealed poorly differentiated carcinoma.  SPEP/IFE did not detect monoclonal protein. PSA normal. 02/25/2022: Underwent colonoscopy that showed malignant tumor in the rectum. Pathology confirmed poorly differentiated carcinoma.  04/05/2022: Underwent port placement 04/08/2022: Cycle 1, Day 1 of FOLFOX (held bevacizumab due to recent port placement) 04/23/2022: Cycle 2, Day 1 of FOLFOX plus bevacizumab 05/07/2022: Cycle 3, Day 1 of FOLFOX plus bevacizumab 05/21/2022: Cycle 4, Day 1 of FOLFOX plus bevacizumab. Transitioned care to Dr. Caren Hazy at [facility] 07/02/2022: Cycle 6, Day 1 of FOLFOX plus bevacizumab at [facility].  07/11/2022: NM PET CT scan showed progression of hypermetabolic osseous metastatic disease, although there is some mixed change as detailed. New hypermetabolic right femoral neck metastasis places the patient at risk for pathologic right femoral neck fracture. 07/30/2022:  Cycle 1 Day 1  FOLFIRI +Panitumumab 08/14/2022: Cycle 2 Day 1 FOLFIRI + Panitumumab 08/27/2022: Cycle 3 Day 1 FOLFIRI + Panitumumab 09/11/2022: Cycle 4 Day 1 FOLFIRI (Hold Panitumumab due to acneiform rash) 10/02/2022: Cycle 5 Day 1 FOLFIRI (Hold Panitumumab due to acneiform rash) 11/07/2022: Cycle 6 Day 1 FOLFIRI plus Panitumumab dose reduced at 4 mg/kg.  11/21/2022: Cycle 7 Day 1 FOLFIRI plus Panitumumab dose reduced at 4 mg/kg.  12/05/2022: Cycle 8 Day 1 FOLFIRI plus Panitumumab dose reduced at 4 mg/kg.   HISTORY OF PRESENTING ILLNESS:  Joseph Hogan 62 y.o. male returns for follow-up for metastatic rectal cancer. He presents today to start Cycle 11, Day 1 today. He is unaccompanied for this visit.   On exam today, Mr. Tomei reports  he unfortunately skipped his last 2 doses of Xtampza and is in quite a lot of pain today.  He reports he fell asleep and when  he woke up he did not want to take the medication because the schedule is off.  He reports that he is not having any back pain and is worst in his right leg.  He reports that his appetite has been okay and slightly improving but for the most part he has to "push it" when he wants to eat.  He notes his energy levels have been low.  His bowel movements are improving and they are more solid and small.  He is not having any diarrhea or nausea vomiting.  He reports that his skin is fine and he is not having any worsening or recurrent rash.  His prior CT scan was ordered but not yet scheduled.  Overall he is willing and able to proceed with chemotherapy at this time.  He denies any fevers, chills, sweats, shortness of breath, chest pain or cough.  A full 10 point ROS is otherwise negative.  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   with abnormal R wave progression noted on EKG   MRSA (methicillin resistant Staphylococcus aureus)    Pre-diabetes    Rectal carcinoma (HCC) 03/19/2022    SURGICAL HISTORY: Past Surgical History:  Procedure Laterality Date   COLONOSCOPY     HUMERUS IM NAIL Right 04/12/2022   Procedure: INTRAMEDULLARY (IM) NAIL HUMERAL;  Surgeon: Bjorn Pippin, MD;  Location: [facility];  Service: Orthopedics;  Laterality: Right;   IR IMAGING GUIDED PORT INSERTION  04/05/2022   TOTAL HIP ARTHROPLASTY Left 02/06/2022   Procedure: TOTAL HIP ARTHROPLASTY;  Surgeon: Joen Laura, MD;  Location: [facility];  Service: Orthopedics;  Laterality: Left;    SOCIAL HISTORY: Social History   Socioeconomic History   Marital status: Divorced    Spouse name: Not on file   Number of children: Not on file   Years of education: Not on file   Highest education  level: Not on file  Occupational History   Not on file  Tobacco Use   Smoking status: Never   Smokeless tobacco: Never  Vaping Use   Vaping status: Never Used  Substance and Sexual Activity   Alcohol use: No   Drug use: No   Sexual activity: Not Currently  Other Topics Concern   Not on file  Social History Narrative   Not on file   Social Drivers of Health   Financial Resource Strain: Not on file  Food Insecurity: No Food Insecurity (10/15/2022)   Hunger Vital Sign    Worried About Running Out of Food in the Last Year: Never true    Ran Out of Food in the Last Year: Never true  Transportation Needs: No Transportation Needs (10/15/2022)   PRAPARE - Administrator, Civil Service (Medical): No    Lack of Transportation (Non-Medical): No  Physical Activity: Not on file  Stress: Not on file  Social Connections: Not on file  Intimate Partner Violence: Not At Risk (10/15/2022)   Humiliation, Afraid, Rape, and Kick questionnaire    Fear of Current or Ex-Partner: No    Emotionally Abused: No    Physically Abused: No    Sexually Abused: No    FAMILY HISTORY: Family History  Problem Relation Age of Onset   Stroke Mother    Hypertension Mother    Heart disease Father    Alzheimer's disease Father  Hypertension Brother    Alcohol abuse Brother    Cancer Maternal Aunt    Heart disease Maternal Aunt    Cancer Maternal Uncle    Heart disease Maternal Uncle    Cancer Paternal Aunt    Heart disease Paternal Aunt    Cancer Paternal Uncle    Heart disease Paternal Uncle    Heart disease Paternal Grandmother    Alzheimer's disease Paternal Grandfather    Stomach cancer Neg Hx    Rectal cancer Neg Hx    Esophageal cancer Neg Hx    Colon cancer Neg Hx     ALLERGIES:  is allergic to codeine, dilaudid [hydromorphone hcl], and lisinopril.  MEDICATIONS:  Current Outpatient Medications  Medication Sig Dispense Refill   cyanocobalamin (VITAMIN B12) 1000 MCG tablet Take  1 tablet (1,000 mcg total) by mouth daily. 30 tablet 1   doxycycline (VIBRA-TABS) 100 MG tablet Take 1 tablet (100 mg total) by mouth 2 (two) times daily. 60 tablet 3   fluconazole (DIFLUCAN) 150 MG tablet Take 1 tablet (150 mg total) by mouth daily. 1 tablet 0   gabapentin (NEURONTIN) 300 MG capsule Take 1 capsule (300 mg total) by mouth at bedtime. (Patient not taking: Reported on 07/31/2022) 30 capsule 1   lidocaine-prilocaine (EMLA) cream Apply a quarter-sized amount to port a cath site and cover with plastic wrap one hour prior to infusion appointments (Patient not taking: Reported on 09/11/2022) 30 g 3   magnesium oxide (MAG-OX) 400 (240 Mg) MG tablet Take 1 tablet (400 mg total) by mouth daily. 30 tablet 0   ondansetron (ZOFRAN) 8 MG tablet Take 2 tablets (16 mg total) by mouth every 8 (eight) hours as needed for nausea or vomiting. (Patient not taking: Reported on 08/14/2022) 20 tablet 3   oxyCODONE ER (XTAMPZA ER) 36 MG C12A Take 1 capsule (36 mg total) by mouth every 12 (twelve) hours. 60 capsule 0   Oxycodone HCl 10 MG TABS Take 1 tablet (10 mg total) by mouth every 6 (six) hours as needed. 60 tablet 0   pantoprazole (PROTONIX) 20 MG tablet Take 1 tablet (20 mg total) by mouth daily. 90 tablet 3   polyethylene glycol (MIRALAX / GLYCOLAX) 17 g packet Take 17 g by mouth daily as needed for mild constipation. 14 each 0   potassium chloride (KLOR-CON M) 10 MEQ tablet Take 1 tablet (10 mEq total) by mouth daily. 30 tablet 0   senna-docusate (SENOKOT-S) 8.6-50 MG tablet Take 1 tablet by mouth 2 (two) times daily. 30 tablet 0   silver sulfADIAZINE (SILVADENE) 1 % cream Apply 1 Application topically daily. 50 g 3   No current facility-administered medications for this visit.    REVIEW OF SYSTEMS:   Constitutional: ( - ) fevers, ( - )  chills , ( - ) night sweats Eyes: ( - ) blurriness of vision, ( - ) double vision, ( - ) watery eyes Ears, nose, mouth, throat, and face: ( - ) mucositis, ( - )  sore throat Respiratory: ( - ) cough, ( - ) dyspnea, ( - ) wheezes Cardiovascular: ( - ) palpitation, ( - ) chest discomfort, ( - ) lower extremity swelling Gastrointestinal:  ( - ) nausea, ( - ) heartburn, ( - ) change in bowel habits Skin: ( + ) abnormal skin rashes Lymphatics: ( - ) new lymphadenopathy, ( - ) easy bruising Neurological: ( - ) numbness, ( - ) tingling, ( - ) new weaknesses Behavioral/Psych: ( - )  mood change, ( - ) new changes  All other systems were reviewed with the patient and are negative.  PHYSICAL EXAMINATION: ECOG PERFORMANCE STATUS: 2 - Symptomatic, <50% confined to bed  Vitals:   01/30/23 0841  BP: (!) 152/95  Pulse: 86  Resp: 16  Temp: 98.7 F (37.1 C)  SpO2: 100%      There were no vitals filed for this visit.     GENERAL: well appearing male in NAD. Exam performed in wheelchair.  SKIN: skin color, texture, turgor are normal,or significant lesions.   EYES: conjunctiva are pink and non-injected, sclera clear LUNGS: clear to auscultation and percussion with normal breathing effort HEART: regular rate & rhythm and no murmurs and no lower extremity edema Musculoskeletal: no cyanosis of digits and no clubbing  PSYCH: alert & oriented x 3, fluent speech NEURO: no focal motor/sensory deficits  LABORATORY DATA:  I have reviewed the data as listed    Latest Ref Rng & Units 01/30/2023    8:07 AM 01/01/2023   12:15 PM 12/18/2022    8:56 AM  CBC  WBC 4.0 - 10.5 K/uL 7.1  2.4  3.3   Hemoglobin 13.0 - 17.0 g/dL 8.9  8.9  8.7   Hematocrit 39.0 - 52.0 % 27.8  27.7  27.5   Platelets 150 - 400 K/uL 411  288  292        Latest Ref Rng & Units 01/30/2023    8:07 AM 01/01/2023   12:15 PM 12/18/2022    8:56 AM  CMP  Glucose 70 - 99 mg/dL 284  132  440   BUN 8 - 23 mg/dL 12  10  8    Creatinine 0.61 - 1.24 mg/dL 1.02  7.25  3.66   Sodium 135 - 145 mmol/L 139  137  137   Potassium 3.5 - 5.1 mmol/L 4.2  3.6  3.6   Chloride 98 - 111 mmol/L 103  103   103   CO2 22 - 32 mmol/L 29  28  26    Calcium 8.9 - 10.3 mg/dL 8.6  8.5  8.5   Total Protein 6.5 - 8.1 g/dL 5.8  5.9  6.0   Total Bilirubin <1.2 mg/dL 0.5  0.6  0.9   Alkaline Phos 38 - 126 U/L 155  129  171   AST 15 - 41 U/L 12  11  13    ALT 0 - 44 U/L <5  <5  6      PATHOLOGY: Rectum, biopsy POORLY DIFFERENTIATED CARCINOMA. SEE NOTE. Diagnosis Note Addendum: Immunohistochemistry shows the carcinoma is positive with MOC-31, cytokeratin 20 and CDX2. Tumor is negative with cytokeratin 7, cytokeratin 5/6, p40, TTF-1, Napsin A, prostate-specific antigen and prostein. The immunophenotype is consistent with primary colorectal adenocarcinoma.  RADIOGRAPHIC STUDIES: No results found.  ASSESSMENT & PLAN EULUS QUINTERO is a 62 y.o. male who presents to the clinic for a follow up for rectal carcinoma.   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  --We reviewed that his cancer is incurable and mainstay treatment is chemotherapy. --Recommend chemotherapy regimen FOLFOX plus Bevacizumab q 2 weeks, started on 04/08/2022.  --progression noted on last PET/CT scan from 07/11/2022. D/c FOLFOX + Bev and start FOLFIRI + Panitumumab on 07/31/2022. PLAN: --Due for Cycle 11, Day 1 today --Labs from today reviewed and adequate for treatment. WBC 7.1, hemoglobin  8.9, MCV 89.4, platelets 411. Creatinine and LFTs adequate.  --Proceed with treatment today without any dose modifications. Continue Panitumumab today but dose reduced at 4 mg/kg.  --RTC in 2 weeks before Cycle 12, Day 1 of FOLFIRI + Panitumumab   #Acneiform rash-- resolved --Secondary to panitumumab --resumed panitumumab dose reduced at 4 mg/kg.  --continue doxycycline 100 mg BID and Cleocin gel for spot treatment. Advised him the importance of taking  both medications to minimize severity of rash.   #Microcytic anemia: #Thrombocytosis: --patient received IV venofer 200 mg once a week x 3  #Right shoulder pain # Leg Pain  # Bone Pain from Metastatic Cancer  --Right shoulder xray form 04/08/2022 showed destructive lesion within the proximal to mid right humerus shaft with acute to subacture pathologic fracture. --Patient underwent pinning of right humerus on 04/12/2022 --Received palliative radiation from 05/14/2022-06/03/2022 to right humerus, L3 spine and pelvis --Current pain regimen includes Xtampza 27 mg q 12 hours, oxycodone 5-10 mg q 6 hours for breakthrough pain.  He has not been requiring as needed oxy. --following with palliative care team.   #Supportive Care -- chemotherapy education complete -- port placement complete.  -- zofran 8mg  q8H PRN and compazine 10mg  PO q6H for nausea -- EMLA cream for port  No orders of the defined types were placed in this encounter.   All questions were answered. The patient knows to call the clinic with any problems, questions or concerns.  I have spent a total of 30 minutes minutes of face-to-face and non-face-to-face time, preparing to see the patient, performing a medically appropriate examination, counseling and educating the patient, ordering medications/tests/procedures,  documenting clinical information in the electronic health record,  and care coordination.    Ulysees Barns, MD Department of Hematology/Oncology [facility] at [facility] Phone: [phone] Pager: [phone] Email: [email or web address]

## 2023-01-30 NOTE — Patient Instructions (Signed)
[facility] - A DEPT OF [facility]  Discharge Instructions: Thank you for choosing [facility] to provide your oncology and hematology care.   If you have a lab appointment with the Cancer Center, please go directly to the Cancer Center and check in at the registration area.   Wear comfortable clothing and clothing appropriate for easy access to any Portacath or PICC line.   We strive to give you quality time with your provider. You may need to reschedule your appointment if you arrive late (15 or more minutes).  Arriving late affects you and other patients whose appointments are after yours.  Also, if you miss three or more appointments without notifying the office, you may be dismissed from the clinic at the provider's discretion.      For prescription refill requests, have your pharmacy contact our office and allow 72 hours for refills to be completed.    Today you received the following chemotherapy and/or immunotherapy agents: Vectibix, Irinotecan, Leucovorin, Fluorouracil      To help prevent nausea and vomiting after your treatment, we encourage you to take your nausea medication as directed.  BELOW ARE SYMPTOMS THAT SHOULD BE REPORTED IMMEDIATELY: *FEVER GREATER THAN 100.4 F (38 C) OR HIGHER *CHILLS OR SWEATING *NAUSEA AND VOMITING THAT IS NOT CONTROLLED WITH YOUR NAUSEA MEDICATION *UNUSUAL SHORTNESS OF BREATH *UNUSUAL BRUISING OR BLEEDING *URINARY PROBLEMS (pain or burning when urinating, or frequent urination) *BOWEL PROBLEMS (unusual diarrhea, constipation, pain near the anus) TENDERNESS IN MOUTH AND THROAT WITH OR WITHOUT PRESENCE OF ULCERS (sore throat, sores in mouth, or a toothache) UNUSUAL RASH, SWELLING OR PAIN  UNUSUAL VAGINAL DISCHARGE OR ITCHING   Items with * indicate a potential emergency and should be followed up as soon as possible or go to the Emergency Department if any problems should occur.  Please show the  CHEMOTHERAPY ALERT CARD or IMMUNOTHERAPY ALERT CARD at check-in to the Emergency Department and triage nurse.  Should you have questions after your visit or need to cancel or reschedule your appointment, please contact [facility] - A DEPT OF [facility]  Dept: [phone]  and follow the prompts.  Office hours are 8:00 a.m. to 4:30 p.m. Monday - Friday. Please note that voicemails left after 4:00 p.m. may not be returned until the following business day.  We are closed weekends and major holidays. You have access to a nurse at all times for urgent questions. Please call the main number to the clinic Dept: [phone] and follow the prompts.   For any non-urgent questions, you may also contact your provider using MyChart. We now offer e-Visits for anyone 43 and older to request care online for non-urgent symptoms. For details visit [email or web address].   Also download the MyChart app! Go to the app store, search "MyChart", open the app, select [place], and log in with your MyChart username and password.

## 2023-02-01 ENCOUNTER — Inpatient Hospital Stay: Payer: Medicare HMO

## 2023-02-01 VITALS — BP 168/139 | HR 72 | Temp 97.9°F | Resp 17

## 2023-02-01 DIAGNOSIS — Z5112 Encounter for antineoplastic immunotherapy: Secondary | ICD-10-CM | POA: Diagnosis not present

## 2023-02-01 DIAGNOSIS — Z5111 Encounter for antineoplastic chemotherapy: Secondary | ICD-10-CM | POA: Diagnosis not present

## 2023-02-01 DIAGNOSIS — C20 Malignant neoplasm of rectum: Secondary | ICD-10-CM | POA: Diagnosis not present

## 2023-02-01 MED ORDER — SODIUM CHLORIDE 0.9% FLUSH
10.0000 mL | INTRAVENOUS | Status: DC | PRN
  Administered 2023-02-01: 10 mL

## 2023-02-01 MED ORDER — HEPARIN SOD (PORK) LOCK FLUSH 100 UNIT/ML IV SOLN
500.0000 [IU] | Freq: Once | INTRAVENOUS | Status: AC | PRN
  Administered 2023-02-01: 500 [IU]

## 2023-02-04 ENCOUNTER — Encounter: Payer: Self-pay | Admitting: Hematology and Oncology

## 2023-02-04 DIAGNOSIS — C7951 Secondary malignant neoplasm of bone: Secondary | ICD-10-CM | POA: Diagnosis not present

## 2023-02-04 DIAGNOSIS — D500 Iron deficiency anemia secondary to blood loss (chronic): Secondary | ICD-10-CM | POA: Diagnosis not present

## 2023-02-04 DIAGNOSIS — D630 Anemia in neoplastic disease: Secondary | ICD-10-CM | POA: Diagnosis not present

## 2023-02-04 DIAGNOSIS — L89312 Pressure ulcer of right buttock, stage 2: Secondary | ICD-10-CM | POA: Diagnosis not present

## 2023-02-04 DIAGNOSIS — E785 Hyperlipidemia, unspecified: Secondary | ICD-10-CM | POA: Diagnosis not present

## 2023-02-04 DIAGNOSIS — G893 Neoplasm related pain (acute) (chronic): Secondary | ICD-10-CM | POA: Diagnosis not present

## 2023-02-04 DIAGNOSIS — I10 Essential (primary) hypertension: Secondary | ICD-10-CM | POA: Diagnosis not present

## 2023-02-04 DIAGNOSIS — C20 Malignant neoplasm of rectum: Secondary | ICD-10-CM | POA: Diagnosis not present

## 2023-02-04 DIAGNOSIS — E119 Type 2 diabetes mellitus without complications: Secondary | ICD-10-CM | POA: Diagnosis not present

## 2023-02-06 ENCOUNTER — Ambulatory Visit (HOSPITAL_COMMUNITY): Payer: Medicare HMO

## 2023-02-06 NOTE — Progress Notes (Deleted)
Palliative Medicine [facility]  [phone] [phone]   Name: Joseph Hogan Date: 02/06/2023 MRN: [id number]  DOB: 10-24-1960  Patient Care Team: [name], MD as PCP - General (Hematology and Oncology) Doreatha Massed, MD as Medical Oncologist (Medical Oncology) Pickenpack-Cousar, Arty Baumgartner, NP as Nurse Practitioner [facility] and Palliative Medicine)   I connected with Joseph Hogan on 02/06/23 at  9:30 AM EST by phone and verified that I am speaking with the correct person using two identifiers.   I discussed the limitations, risks, security and privacy concerns of performing an evaluation and management service by telemedicine and the availability of in-person appointments. I also discussed with the patient that there may be a patient responsible charge related to this service. The patient expressed understanding and agreed to proceed.   Other persons participating in the visit and their role in the encounter: n/a   Patient's location: home  Provider's location: [facility]   Chief Complaint: f/u of symptom management    INTERVAL HISTORY: Joseph Hogan is a 62 y.o. 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 Palliative ask to see for symptom management and goals of care.   SOCIAL HISTORY:     reports that he has never smoked. He has never used smokeless tobacco. He reports that he does not drink alcohol and does not use drugs.  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  MEDICATIONS:  Current Outpatient Medications  Medication Sig Dispense Refill   cyanocobalamin (VITAMIN B12) 1000 MCG tablet Take 1 tablet (1,000 mcg total) by mouth daily. 30 tablet 1   doxycycline (VIBRA-TABS) 100 MG tablet Take 1 tablet (100 mg total) by mouth 2 (two) times daily. 60 tablet 3   fluconazole (DIFLUCAN) 150 MG tablet Take 1 tablet (150 mg total) by mouth daily. 1 tablet 0   gabapentin (NEURONTIN) 300 MG capsule Take 1 capsule (300 mg total) by mouth at bedtime. (Patient not taking: Reported on 07/31/2022) 30 capsule 1   lidocaine-prilocaine (EMLA) cream Apply a quarter-sized amount to port a cath site and cover with plastic wrap one hour prior to infusion appointments (Patient not taking: Reported on 09/11/2022) 30 g 3   magnesium oxide (MAG-OX) 400 (240 Mg) MG tablet Take 1 tablet (400 mg total) by mouth daily. 30 tablet 0   ondansetron (ZOFRAN) 8 MG tablet Take 2 tablets (16 mg total) by mouth every 8 (eight) hours as needed for nausea or vomiting. (Patient not taking: Reported on 08/14/2022) 20 tablet 3   oxyCODONE ER (XTAMPZA ER) 36 MG C12A Take 1 capsule (36 mg total) by mouth every 12 (twelve) hours. 60 capsule 0   Oxycodone HCl 10 MG TABS Take 1 tablet (10 mg total) by mouth every 6 (six) hours as needed. 60 tablet 0   pantoprazole (PROTONIX) 20 MG tablet Take 1 tablet (20 mg total) by mouth daily. 90 tablet 3  polyethylene glycol (MIRALAX / GLYCOLAX) 17 g packet Take 17 g by mouth daily as needed for mild constipation. 14 each 0   potassium chloride (KLOR-CON M) 10 MEQ tablet Take 1 tablet (10 mEq total) by mouth daily. 30 tablet 0   senna-docusate (SENOKOT-S) 8.6-50 MG tablet Take 1 tablet by mouth 2 (two) times daily. 30 tablet 0   silver sulfADIAZINE (SILVADENE) 1 % cream Apply 1 Application topically daily. 50 g 3   No current facility-administered medications for this  visit.    VITAL SIGNS: There were no vitals taken for this visit. There were no vitals filed for this visit.  Estimated body mass index is 24.69 kg/m as calculated from the following:   Height as of 01/01/23: 5\' 11"  (1.803 m).   Weight as of 01/30/23: 177 lb (80.3 kg).   PERFORMANCE STATUS (ECOG) : 2 - Symptomatic, <50% confined to bed  Discussed the use of AI scribe software for clinical note transcription with the patient, who gave verbal consent to proceed.   IMPRESSION:  Joseph Hogan presents to clinic for symptom management follow-up. No acute distress. His son is present. Patient is in a wheelchair. Skin continues to be dry. He reports no pain and describes the sensation in his right hip as an occasional 'tingle.' He has been managing his medications well, with a noted decrease in his supply of a 12-hour medication, which will be refilled. He is much appreciative in his improvement in pain.  Joseph Hogan has his pain medication bottles with him today. All medications reviewed. He is taking as prescribed. He has his oxycodone 10mg  breakthrough medication which he has used sparingly when needed.  Reports sleeping well, typically from 10pm to around 9 or 10am. He experienced a bout of diarrhea lasting four to five days, which has since resolved. During this period, he took two painkillers, which seemed to help stop the diarrhea. His appetite has been fluctuating, typically decreasing around the time of his treatments, but it has been improving in the last two days. We discussed the need to increase his appetite. Current weight is 180lbs down from 191lbs on 10/24, 209lbs on 10/16. We will connect him with a dietician for additional support.   We reviewed patient's scheduled for regular treatments and radiation, with a long treatment day including a doctor's appointment coming up. He has a pump that is typically removed on Fridays. Son verbalized understanding of calendar of events.  Goals of  Care  11/07/22- We discussed his current illness and what it means in the larger context of his on-going co-morbidities. Natural disease trajectory and expectations were discussed.   Patient's son verbalized understanding of his father's current illness. Patient also able to acknowledge some form of understanding but with some obvious signs of confusion when it comes to memory recall. He mentions his children assist with most of his medical needs.    I empathetically approached discussions regarding healthcare limitations including code status and advanced directives. Son, Joseph Hogan states they are in the process of completing documents. Joseph Hogan (patient) confirms. They verbalize patient's [name] is primary medical decision maker. They request patient to remain full code with full scope care. Joseph Hogan and son are clear in expressed wishes to continue to treat the treatable allowing patient every opportunity to continue to do as good as he can for as long as he can while managing his symptoms.     We discussed Her current illness and what it means in the larger  context of Her on-going co-morbidities. Natural disease trajectory and expectations were discussed.  I discussed the importance of continued conversation with family and their medical providers regarding overall plan of care and treatment options, ensuring decisions are within the context of the patients values and GOCs.  PLAN:  Pain Management Pain well controlled with current regimen. Patient has been taking Oxycodone 10mg  as needed for breakthrough pain.  -Continue Oxycodone 10mg  as needed for pain. Marlowe Kays 36mg  every 12 hours.  -His daughter manages weekly pill container   Medication Refill Patient's medication is running low. -Send refill to local pharmacy.  Gastrointestinal Issues Patient experienced diarrhea for several days, which has now resolved. -Monitor symptoms.  Appetite Decreased appetite post-treatment,  improving as next treatment approaches. -Encourage patient to continue working on improving appetite. -Weight significantly decreased over the past month.  -Dietician referral   Cancer Treatment Patient is undergoing radiation therapy and has chemotherapy scheduled. -Continue with current treatment plan. -Next chemotherapy session scheduled for 12/18/2022.  Follow-up Patient to return for treatment and doctor's appointment on 12/18/2022. -Will follow-up in clinic on 12/26/2022. Patient expressed understanding and was in agreement with this plan. He also understands that He can call the clinic at any time with any questions, concerns, or complaints.   Any controlled substances utilized were prescribed in the context of palliative care. PDMP has been reviewed.   Visit consisted of counseling and education dealing with the complex and emotionally intense issues of symptom management and palliative care in the setting of serious and potentially life-threatening illness.  Joseph Hogan, AGPCNP-BC  Palliative Medicine Team/[facility]  *Please note that this is a verbal dictation therefore any spelling or grammatical errors are due to the "Dragon [employer] One" system interpretation.

## 2023-02-10 DIAGNOSIS — D630 Anemia in neoplastic disease: Secondary | ICD-10-CM | POA: Diagnosis not present

## 2023-02-10 DIAGNOSIS — C7951 Secondary malignant neoplasm of bone: Secondary | ICD-10-CM | POA: Diagnosis not present

## 2023-02-10 DIAGNOSIS — E119 Type 2 diabetes mellitus without complications: Secondary | ICD-10-CM | POA: Diagnosis not present

## 2023-02-10 DIAGNOSIS — I10 Essential (primary) hypertension: Secondary | ICD-10-CM | POA: Diagnosis not present

## 2023-02-10 DIAGNOSIS — D500 Iron deficiency anemia secondary to blood loss (chronic): Secondary | ICD-10-CM | POA: Diagnosis not present

## 2023-02-10 DIAGNOSIS — G893 Neoplasm related pain (acute) (chronic): Secondary | ICD-10-CM | POA: Diagnosis not present

## 2023-02-10 DIAGNOSIS — L89312 Pressure ulcer of right buttock, stage 2: Secondary | ICD-10-CM | POA: Diagnosis not present

## 2023-02-10 DIAGNOSIS — C20 Malignant neoplasm of rectum: Secondary | ICD-10-CM | POA: Diagnosis not present

## 2023-02-10 DIAGNOSIS — E785 Hyperlipidemia, unspecified: Secondary | ICD-10-CM | POA: Diagnosis not present

## 2023-02-12 ENCOUNTER — Other Ambulatory Visit: Payer: Self-pay | Admitting: Hematology and Oncology

## 2023-02-12 DIAGNOSIS — C20 Malignant neoplasm of rectum: Secondary | ICD-10-CM

## 2023-02-12 NOTE — Progress Notes (Deleted)
 Patient Care Team: Federico Norleen ONEIDA MADISON, MD as PCP - General (Hematology and Oncology) Rogers Hai, MD as Medical Oncologist (Medical Oncology) Pickenpack-Cousar, Fannie SAILOR, NP as Nurse Practitioner [facility] and Palliative Medicine)   CHIEF COMPLAINT: Follow up metastatic rectal cancer   CURRENT THERAPY:  FOLFIRI/panitumumab  q14 days starting 07/30/2022, dose reduced for rash   INTERVAL HISTORY Mr. Drab returns for follow up as scheduled, he continues FOLFIRI/panitumumab  s/p C11.   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   with abnormal R wave progression noted on EKG   MRSA (methicillin resistant Staphylococcus aureus)    Pre-diabetes    Rectal carcinoma (HCC) 03/19/2022     Past Surgical History:  Procedure Laterality Date   COLONOSCOPY     HUMERUS IM NAIL Right 04/12/2022   Procedure: INTRAMEDULLARY (IM) NAIL HUMERAL;  Surgeon: Cristy Bonner ONEIDA, MD;  Location: [facility];  Service: Orthopedics;  Laterality: Right;   IR IMAGING GUIDED PORT INSERTION  04/05/2022   TOTAL HIP ARTHROPLASTY Left 02/06/2022   Procedure: TOTAL HIP ARTHROPLASTY;  Surgeon: Edna Toribio LABOR, MD;  Location: [facility];  Service: Orthopedics;  Laterality: Left;     Outpatient Encounter Medications as of 02/13/2023  Medication Sig   cyanocobalamin  (VITAMIN B12) 1000 MCG tablet Take 1 tablet (1,000 mcg total) by mouth daily.   doxycycline  (VIBRA -TABS) 100 MG tablet Take 1 tablet (100 mg total) by mouth 2 (two) times daily.   fluconazole  (DIFLUCAN ) 150 MG tablet Take 1 tablet (150 mg total) by mouth daily.   gabapentin  (NEURONTIN ) 300 MG capsule Take 1 capsule (300 mg total) by mouth at bedtime. (Patient not taking: Reported on 07/31/2022)   lidocaine -prilocaine  (EMLA ) cream Apply a quarter-sized amount to port a cath site and cover with plastic wrap one hour prior to infusion appointments (Patient not  taking: Reported on 09/11/2022)   magnesium  oxide (MAG-OX) 400 (240 Mg) MG tablet Take 1 tablet (400 mg total) by mouth daily.   ondansetron  (ZOFRAN ) 8 MG tablet Take 2 tablets (16 mg total) by mouth every 8 (eight) hours as needed for nausea or vomiting. (Patient not taking: Reported on 08/14/2022)   oxyCODONE  ER (XTAMPZA  ER) 36 MG C12A Take 1 capsule (36 mg total) by mouth every 12 (twelve) hours.   Oxycodone  HCl 10 MG TABS Take 1 tablet (10 mg total) by mouth every 6 (six) hours as needed.   pantoprazole  (PROTONIX ) 20 MG tablet Take 1 tablet (20 mg total) by mouth daily.   polyethylene glycol (MIRALAX  / GLYCOLAX ) 17 g packet Take 17 g by mouth daily as needed for mild constipation.   potassium chloride  (KLOR-CON  M) 10 MEQ tablet Take 1 tablet (10 mEq total) by mouth daily.   senna-docusate (SENOKOT-S) 8.6-50 MG tablet Take 1 tablet by mouth 2 (two) times daily.   silver  sulfADIAZINE  (SILVADENE ) 1 % cream Apply 1 Application topically daily.   No facility-administered encounter medications on file as of 02/13/2023.     There were no vitals filed for this visit. There is no height or weight on file to calculate BMI.   PHYSICAL EXAM GENERAL:alert, no distress and comfortable SKIN: no rash  EYES: sclera clear NECK: without mass LYMPH:  no palpable cervical or supraclavicular lymphadenopathy  LUNGS: clear with normal breathing effort HEART: regular rate & rhythm, no lower extremity edema ABDOMEN: abdomen soft, non-tender and normal bowel  sounds NEURO: alert & oriented x 3 with fluent speech, no focal motor/sensory deficits Breast exam:  PAC without erythema    CBC    Component Value Date/Time   WBC 7.1 01/30/2023 0807   WBC 8.2 11/05/2022 1400   RBC 3.11 (L) 01/30/2023 0807   HGB 8.9 (L) 01/30/2023 0807   HCT 27.8 (L) 01/30/2023 0807   PLT 411 (H) 01/30/2023 0807   MCV 89.4 01/30/2023 0807   MCH 28.6 01/30/2023 0807   MCHC 32.0 01/30/2023 0807   RDW 15.5 01/30/2023 0807    LYMPHSABS 0.4 (L) 01/30/2023 0807   MONOABS 0.8 01/30/2023 0807   EOSABS 0.0 01/30/2023 0807   BASOSABS 0.0 01/30/2023 0807     CMP     Component Value Date/Time   NA 139 01/30/2023 0807   K 4.2 01/30/2023 0807   CL 103 01/30/2023 0807   CO2 29 01/30/2023 0807   GLUCOSE 108 (H) 01/30/2023 0807   BUN 12 01/30/2023 0807   CREATININE 0.59 (L) 01/30/2023 0807   CREATININE 0.77 06/04/2016 1259   CALCIUM  8.6 (L) 01/30/2023 0807   PROT 5.8 (L) 01/30/2023 0807   ALBUMIN 2.9 (L) 01/30/2023 0807   AST 12 (L) 01/30/2023 0807   ALT <5 01/30/2023 0807   ALKPHOS 155 (H) 01/30/2023 0807   BILITOT 0.5 01/30/2023 0807   GFRNONAA >60 01/30/2023 0807   GFRNONAA >89 04/08/2015 0837   GFRAA >60 10/13/2019 1333   GFRAA >89 04/08/2015 0837     ASSESSMENT & PLAN: 63 yo male with   # Metastatic Rectal Adenocarcinoma 02/03/2022-02/11/2022: Presented to ED due to left hip pain after a mechanical fall.  02/04/2022: CT left ype:Jrluz pathologic fracture of the left femoral neck with displacement and angulation, with underlying lytic lesions in the femoral head and neck.Additional lytic destructive lesion of the left parasymphyseal pubic bone extending throughout the superior pubic ramus to the puboacetabular junction, with pathologic fracture of the superior pubic ramus. Adjacent hypoattenuation in the pelvic component of the obturator internus muscle, suspicious for tumor involvement. Probable small lytic lesion in the inferior pubic ramus.Prominent left external iliac lymph nodes measuring up to 1.2 cm. 02/05/2022: CT CAP: Pathologic retroperitoneal and bilateral pelvic adenopathy, with lytic destructive lesions of the left superior and inferior pubic ramus and pubic body as well as a pathologic fracture through the left femoral neck. Pathologic fracture anteriorly in the right second rib. Deformity anteriorly in the right third rib probably from early pathologic fracture. Metastatic disease or myeloma  strongly favored over multifocal osteomyelitis as a cause. Mildly enlarged left supraclavicular lymph node and lower thoracic periaortic lymph node. Pathologic retroperitoneal and pelvic adenopathy. 10 cm long segment of rectal wall thickening.1.1 cm exophytic lesion from the left mid kidney posterolaterally, nonspecific for complex cyst versus tumor. Bandlike atelectasis in both lungs.Small type 1 hiatal hernia.Lumbar spondylosis and degenerative disc disease causing generally mild multilevel impingement. Sigmoid colon diverticulosis.Wall thickening in the proximal stomach body, probably secondary to nondistention. 02/06/2022: Underwent left total hip arthroplasty. Pathology revealed poorly differentiated carcinoma.  SPEP/IFE did not detect monoclonal protein. PSA normal. 02/25/2022: Underwent colonoscopy that showed malignant tumor in the rectum. Pathology confirmed poorly differentiated carcinoma.  04/05/2022: Underwent port placement 04/08/2022: Cycle 1, Day 1 of FOLFOX (held bevacizumab  due to recent port placement) 04/23/2022: Cycle 2, Day 1 of FOLFOX plus bevacizumab  05/07/2022: Cycle 3, Day 1 of FOLFOX plus bevacizumab  05/21/2022: Cycle 4, Day 1 of FOLFOX plus bevacizumab . Transitioned care to Dr. Lynna at [facility] 05/14/22 -  06/03/22 palliative radiation per Dr. Dewey to R humerus, lumbar spine, and left pelvis  07/02/2022: Cycle 6, Day 1 of FOLFOX plus bevacizumab  at [facility].  07/11/2022: NM PET CT scan showed progression of hypermetabolic osseous metastatic disease, although there is some mixed change as detailed. New hypermetabolic right femoral neck metastasis places the patient at risk for pathologic right femoral neck fracture. 07/30/2022:  Cycle 1 Day 1  FOLFIRI +Panitumumab  08/14/2022: Cycle 2 Day 1 FOLFIRI + Panitumumab  08/27/2022: Cycle 3 Day 1 FOLFIRI + Panitumumab  09/11/2022: Cycle 4 Day 1 FOLFIRI (Hold Panitumumab  due to acneiform rash) 10/02/2022: Cycle 5 Day 1 FOLFIRI (Hold  Panitumumab  due to acneiform rash) 11/07/2022: Cycle 6 Day 1 FOLFIRI plus Panitumumab  dose reduced at 4 mg/kg.  11/21/2022: cycle 7 day 1 FOLFIRI plus dose reduced panitumumab  4 mg/kg. Start oral K/Mg S/p palliative radiation to R pelvis/pubis 12/13/22 - 12/27/22     PLAN:  No orders of the defined types were placed in this encounter.     All questions were answered. The patient knows to call the clinic with any problems, questions or concerns. No barriers to learning were detected. I spent *** counseling the patient face to face. The total time spent in the appointment was *** and more than 50% was on counseling, review of test results, and coordination of care.   Ledora Delker, NP-C @DATE @

## 2023-02-13 ENCOUNTER — Inpatient Hospital Stay: Payer: Medicare HMO | Admitting: Nurse Practitioner

## 2023-02-13 ENCOUNTER — Telehealth: Payer: Self-pay

## 2023-02-13 ENCOUNTER — Telehealth: Payer: Self-pay | Admitting: *Deleted

## 2023-02-13 ENCOUNTER — Inpatient Hospital Stay: Payer: Medicare HMO | Attending: Radiation Oncology

## 2023-02-13 ENCOUNTER — Inpatient Hospital Stay: Payer: Medicare HMO

## 2023-02-13 DIAGNOSIS — C779 Secondary and unspecified malignant neoplasm of lymph node, unspecified: Secondary | ICD-10-CM | POA: Insufficient documentation

## 2023-02-13 DIAGNOSIS — D75839 Thrombocytosis, unspecified: Secondary | ICD-10-CM | POA: Insufficient documentation

## 2023-02-13 DIAGNOSIS — G893 Neoplasm related pain (acute) (chronic): Secondary | ICD-10-CM | POA: Insufficient documentation

## 2023-02-13 DIAGNOSIS — C7951 Secondary malignant neoplasm of bone: Secondary | ICD-10-CM | POA: Insufficient documentation

## 2023-02-13 DIAGNOSIS — C20 Malignant neoplasm of rectum: Secondary | ICD-10-CM | POA: Insufficient documentation

## 2023-02-13 DIAGNOSIS — D509 Iron deficiency anemia, unspecified: Secondary | ICD-10-CM | POA: Insufficient documentation

## 2023-02-13 NOTE — Telephone Encounter (Signed)
 Called patient due to not showing up for his 830 flush appointment, patient stated he did not feel well and he didn't have a ride. He asked to have them rescheduled to next week he has a ride for any day. Will make scheduling aware.

## 2023-02-13 NOTE — Telephone Encounter (Signed)
 TCT patient as he missed his appt today. Spoke with him. He states he is just fatigued, not sick. He states he did not have a ride. Advised that he will just keep his next appts on 02/26/23 and also that his CT scan has been scheduled for 02/24/23 m@ 12:15. Pt wrote down these appts and will share with his son.

## 2023-02-15 ENCOUNTER — Inpatient Hospital Stay: Payer: Medicare HMO

## 2023-02-18 DIAGNOSIS — C7951 Secondary malignant neoplasm of bone: Secondary | ICD-10-CM | POA: Diagnosis not present

## 2023-02-18 DIAGNOSIS — G893 Neoplasm related pain (acute) (chronic): Secondary | ICD-10-CM | POA: Diagnosis not present

## 2023-02-18 DIAGNOSIS — I10 Essential (primary) hypertension: Secondary | ICD-10-CM | POA: Diagnosis not present

## 2023-02-18 DIAGNOSIS — D630 Anemia in neoplastic disease: Secondary | ICD-10-CM | POA: Diagnosis not present

## 2023-02-18 DIAGNOSIS — L89312 Pressure ulcer of right buttock, stage 2: Secondary | ICD-10-CM | POA: Diagnosis not present

## 2023-02-18 DIAGNOSIS — E785 Hyperlipidemia, unspecified: Secondary | ICD-10-CM | POA: Diagnosis not present

## 2023-02-18 DIAGNOSIS — C20 Malignant neoplasm of rectum: Secondary | ICD-10-CM | POA: Diagnosis not present

## 2023-02-18 DIAGNOSIS — D500 Iron deficiency anemia secondary to blood loss (chronic): Secondary | ICD-10-CM | POA: Diagnosis not present

## 2023-02-18 DIAGNOSIS — E119 Type 2 diabetes mellitus without complications: Secondary | ICD-10-CM | POA: Diagnosis not present

## 2023-02-20 ENCOUNTER — Telehealth: Payer: Self-pay

## 2023-02-20 NOTE — Telephone Encounter (Signed)
 This RN was notified that the pt wanted a call to discuss pain medications, attempted to call, no answer. LVM and callback number.

## 2023-02-24 ENCOUNTER — Ambulatory Visit (HOSPITAL_COMMUNITY): Payer: Medicare HMO | Attending: Hematology and Oncology

## 2023-02-25 ENCOUNTER — Telehealth: Payer: Self-pay | Admitting: *Deleted

## 2023-02-25 DIAGNOSIS — E785 Hyperlipidemia, unspecified: Secondary | ICD-10-CM | POA: Diagnosis not present

## 2023-02-25 DIAGNOSIS — C7951 Secondary malignant neoplasm of bone: Secondary | ICD-10-CM | POA: Diagnosis not present

## 2023-02-25 DIAGNOSIS — L89312 Pressure ulcer of right buttock, stage 2: Secondary | ICD-10-CM | POA: Diagnosis not present

## 2023-02-25 DIAGNOSIS — I10 Essential (primary) hypertension: Secondary | ICD-10-CM | POA: Diagnosis not present

## 2023-02-25 DIAGNOSIS — C20 Malignant neoplasm of rectum: Secondary | ICD-10-CM | POA: Diagnosis not present

## 2023-02-25 DIAGNOSIS — E119 Type 2 diabetes mellitus without complications: Secondary | ICD-10-CM | POA: Diagnosis not present

## 2023-02-25 DIAGNOSIS — D630 Anemia in neoplastic disease: Secondary | ICD-10-CM | POA: Diagnosis not present

## 2023-02-25 DIAGNOSIS — D500 Iron deficiency anemia secondary to blood loss (chronic): Secondary | ICD-10-CM | POA: Diagnosis not present

## 2023-02-25 DIAGNOSIS — G893 Neoplasm related pain (acute) (chronic): Secondary | ICD-10-CM | POA: Diagnosis not present

## 2023-02-25 NOTE — Telephone Encounter (Signed)
 Spoke with Joseph Hogan, [facility] nurse with [facility]. She reports patient's wound on buttocks is much worse than last week - increased to 3+ inches from 1/2 inch last week. She is requesting orders to re-certify him for continued wound care.   Joseph Hogan reports his color is pale and he has less energy. She states he reported his family was out of town last week and arrived home Sunday. Patient's daughter has been in contact today with Joseph Hogan regarding father. Joseph Hogan also stated she has concerns about how patient is presenting, stating he is not confused, but seems different in affect.  Joseph Hogan Joseph Hogan that patient has appt here at [facility] 02/26/23 in AM for labs, see NP and have treatment. He did not have his CT scan on 02/24/23. She said she will advise daughter of patient's appts here tomorrow.  Message routed to Dr. Federico and support staff.

## 2023-02-26 ENCOUNTER — Emergency Department (HOSPITAL_COMMUNITY): Payer: Medicare HMO

## 2023-02-26 ENCOUNTER — Inpatient Hospital Stay: Payer: Medicare HMO

## 2023-02-26 ENCOUNTER — Inpatient Hospital Stay: Payer: Medicare HMO | Admitting: Nurse Practitioner

## 2023-02-26 ENCOUNTER — Encounter (HOSPITAL_COMMUNITY): Payer: Self-pay | Admitting: Emergency Medicine

## 2023-02-26 ENCOUNTER — Telehealth: Payer: Self-pay

## 2023-02-26 ENCOUNTER — Telehealth: Payer: Self-pay | Admitting: *Deleted

## 2023-02-26 ENCOUNTER — Other Ambulatory Visit: Payer: Self-pay

## 2023-02-26 ENCOUNTER — Inpatient Hospital Stay (HOSPITAL_COMMUNITY)
Admission: EM | Admit: 2023-02-26 | Discharge: 2023-03-06 | DRG: 542 | Disposition: A | Discharge status: Skilled Nursing Facility | Payer: Medicare HMO | Attending: Internal Medicine | Admitting: Internal Medicine

## 2023-02-26 DIAGNOSIS — L89311 Pressure ulcer of right buttock, stage 1: Secondary | ICD-10-CM | POA: Diagnosis present

## 2023-02-26 DIAGNOSIS — Z515 Encounter for palliative care: Secondary | ICD-10-CM

## 2023-02-26 DIAGNOSIS — K869 Disease of pancreas, unspecified: Secondary | ICD-10-CM | POA: Diagnosis present

## 2023-02-26 DIAGNOSIS — L89321 Pressure ulcer of left buttock, stage 1: Secondary | ICD-10-CM | POA: Diagnosis present

## 2023-02-26 DIAGNOSIS — N179 Acute kidney failure, unspecified: Secondary | ICD-10-CM | POA: Diagnosis present

## 2023-02-26 DIAGNOSIS — I10 Essential (primary) hypertension: Secondary | ICD-10-CM | POA: Diagnosis present

## 2023-02-26 DIAGNOSIS — Z885 Allergy status to narcotic agent status: Secondary | ICD-10-CM | POA: Diagnosis not present

## 2023-02-26 DIAGNOSIS — Z9221 Personal history of antineoplastic chemotherapy: Secondary | ICD-10-CM | POA: Diagnosis not present

## 2023-02-26 DIAGNOSIS — M47816 Spondylosis without myelopathy or radiculopathy, lumbar region: Secondary | ICD-10-CM | POA: Diagnosis not present

## 2023-02-26 DIAGNOSIS — R600 Localized edema: Secondary | ICD-10-CM | POA: Diagnosis not present

## 2023-02-26 DIAGNOSIS — N281 Cyst of kidney, acquired: Secondary | ICD-10-CM | POA: Diagnosis not present

## 2023-02-26 DIAGNOSIS — B369 Superficial mycosis, unspecified: Secondary | ICD-10-CM | POA: Diagnosis present

## 2023-02-26 DIAGNOSIS — E119 Type 2 diabetes mellitus without complications: Secondary | ICD-10-CM | POA: Diagnosis present

## 2023-02-26 DIAGNOSIS — R609 Edema, unspecified: Secondary | ICD-10-CM | POA: Diagnosis not present

## 2023-02-26 DIAGNOSIS — Z79899 Other long term (current) drug therapy: Secondary | ICD-10-CM

## 2023-02-26 DIAGNOSIS — R339 Retention of urine, unspecified: Secondary | ICD-10-CM | POA: Diagnosis present

## 2023-02-26 DIAGNOSIS — C799 Secondary malignant neoplasm of unspecified site: Secondary | ICD-10-CM | POA: Diagnosis present

## 2023-02-26 DIAGNOSIS — Z8249 Family history of ischemic heart disease and other diseases of the circulatory system: Secondary | ICD-10-CM | POA: Diagnosis not present

## 2023-02-26 DIAGNOSIS — D630 Anemia in neoplastic disease: Secondary | ICD-10-CM | POA: Diagnosis present

## 2023-02-26 DIAGNOSIS — Z7189 Other specified counseling: Secondary | ICD-10-CM | POA: Diagnosis not present

## 2023-02-26 DIAGNOSIS — E860 Dehydration: Secondary | ICD-10-CM | POA: Diagnosis present

## 2023-02-26 DIAGNOSIS — Z888 Allergy status to other drugs, medicaments and biological substances status: Secondary | ICD-10-CM

## 2023-02-26 DIAGNOSIS — R531 Weakness: Principal | ICD-10-CM

## 2023-02-26 DIAGNOSIS — E43 Unspecified severe protein-calorie malnutrition: Secondary | ICD-10-CM | POA: Diagnosis present

## 2023-02-26 DIAGNOSIS — J168 Pneumonia due to other specified infectious organisms: Secondary | ICD-10-CM | POA: Diagnosis not present

## 2023-02-26 DIAGNOSIS — R590 Localized enlarged lymph nodes: Secondary | ICD-10-CM | POA: Diagnosis not present

## 2023-02-26 DIAGNOSIS — G8929 Other chronic pain: Secondary | ICD-10-CM | POA: Diagnosis not present

## 2023-02-26 DIAGNOSIS — M79605 Pain in left leg: Secondary | ICD-10-CM | POA: Diagnosis not present

## 2023-02-26 DIAGNOSIS — Z6824 Body mass index (BMI) 24.0-24.9, adult: Secondary | ICD-10-CM

## 2023-02-26 DIAGNOSIS — C20 Malignant neoplasm of rectum: Secondary | ICD-10-CM | POA: Diagnosis present

## 2023-02-26 DIAGNOSIS — R338 Other retention of urine: Secondary | ICD-10-CM

## 2023-02-26 DIAGNOSIS — L89150 Pressure ulcer of sacral region, unstageable: Secondary | ICD-10-CM | POA: Diagnosis present

## 2023-02-26 DIAGNOSIS — G893 Neoplasm related pain (acute) (chronic): Secondary | ICD-10-CM | POA: Diagnosis present

## 2023-02-26 DIAGNOSIS — R54 Age-related physical debility: Secondary | ICD-10-CM | POA: Diagnosis present

## 2023-02-26 DIAGNOSIS — K6289 Other specified diseases of anus and rectum: Secondary | ICD-10-CM | POA: Diagnosis present

## 2023-02-26 DIAGNOSIS — E440 Moderate protein-calorie malnutrition: Secondary | ICD-10-CM | POA: Diagnosis not present

## 2023-02-26 DIAGNOSIS — R52 Pain, unspecified: Secondary | ICD-10-CM

## 2023-02-26 DIAGNOSIS — M1712 Unilateral primary osteoarthritis, left knee: Secondary | ICD-10-CM | POA: Diagnosis not present

## 2023-02-26 DIAGNOSIS — D509 Iron deficiency anemia, unspecified: Secondary | ICD-10-CM | POA: Diagnosis present

## 2023-02-26 DIAGNOSIS — Z823 Family history of stroke: Secondary | ICD-10-CM

## 2023-02-26 DIAGNOSIS — J439 Emphysema, unspecified: Secondary | ICD-10-CM | POA: Diagnosis not present

## 2023-02-26 DIAGNOSIS — C7951 Secondary malignant neoplasm of bone: Principal | ICD-10-CM | POA: Diagnosis present

## 2023-02-26 DIAGNOSIS — E785 Hyperlipidemia, unspecified: Secondary | ICD-10-CM | POA: Diagnosis present

## 2023-02-26 DIAGNOSIS — M5417 Radiculopathy, lumbosacral region: Secondary | ICD-10-CM | POA: Diagnosis present

## 2023-02-26 DIAGNOSIS — L89312 Pressure ulcer of right buttock, stage 2: Secondary | ICD-10-CM | POA: Diagnosis not present

## 2023-02-26 DIAGNOSIS — Z811 Family history of alcohol abuse and dependence: Secondary | ICD-10-CM

## 2023-02-26 DIAGNOSIS — K8689 Other specified diseases of pancreas: Secondary | ICD-10-CM | POA: Diagnosis not present

## 2023-02-26 DIAGNOSIS — Z85048 Personal history of other malignant neoplasm of rectum, rectosigmoid junction, and anus: Secondary | ICD-10-CM | POA: Diagnosis not present

## 2023-02-26 DIAGNOSIS — J189 Pneumonia, unspecified organism: Secondary | ICD-10-CM

## 2023-02-26 DIAGNOSIS — D500 Iron deficiency anemia secondary to blood loss (chronic): Secondary | ICD-10-CM | POA: Diagnosis not present

## 2023-02-26 DIAGNOSIS — M6281 Muscle weakness (generalized): Secondary | ICD-10-CM | POA: Diagnosis not present

## 2023-02-26 DIAGNOSIS — M899 Disorder of bone, unspecified: Secondary | ICD-10-CM | POA: Diagnosis not present

## 2023-02-26 DIAGNOSIS — Z96642 Presence of left artificial hip joint: Secondary | ICD-10-CM | POA: Diagnosis present

## 2023-02-26 DIAGNOSIS — R278 Other lack of coordination: Secondary | ICD-10-CM | POA: Diagnosis not present

## 2023-02-26 DIAGNOSIS — Z923 Personal history of irradiation: Secondary | ICD-10-CM

## 2023-02-26 DIAGNOSIS — Z820 Family history of epilepsy and other diseases of the nervous system: Secondary | ICD-10-CM

## 2023-02-26 LAB — URINALYSIS, ROUTINE W REFLEX MICROSCOPIC
Bilirubin Urine: NEGATIVE
Glucose, UA: NEGATIVE mg/dL
Hgb urine dipstick: NEGATIVE
Ketones, ur: NEGATIVE mg/dL
Leukocytes,Ua: NEGATIVE
Nitrite: NEGATIVE
Protein, ur: NEGATIVE mg/dL
Specific Gravity, Urine: 1.016 (ref 1.005–1.030)
pH: 5 (ref 5.0–8.0)

## 2023-02-26 LAB — CBC WITH DIFFERENTIAL/PLATELET
Abs Immature Granulocytes: 0.12 10*3/uL — ABNORMAL HIGH (ref 0.00–0.07)
Basophils Absolute: 0 10*3/uL (ref 0.0–0.1)
Basophils Relative: 0 %
Eosinophils Absolute: 0 10*3/uL (ref 0.0–0.5)
Eosinophils Relative: 0 %
HCT: 25.8 % — ABNORMAL LOW (ref 39.0–52.0)
Hemoglobin: 7.9 g/dL — ABNORMAL LOW (ref 13.0–17.0)
Immature Granulocytes: 1 %
Lymphocytes Relative: 4 %
Lymphs Abs: 0.4 10*3/uL — ABNORMAL LOW (ref 0.7–4.0)
MCH: 26.6 pg (ref 26.0–34.0)
MCHC: 30.6 g/dL (ref 30.0–36.0)
MCV: 86.9 fL (ref 80.0–100.0)
Monocytes Absolute: 0.8 10*3/uL (ref 0.1–1.0)
Monocytes Relative: 7 %
Neutro Abs: 9.7 10*3/uL — ABNORMAL HIGH (ref 1.7–7.7)
Neutrophils Relative %: 88 %
Platelets: 481 10*3/uL — ABNORMAL HIGH (ref 150–400)
RBC: 2.97 MIL/uL — ABNORMAL LOW (ref 4.22–5.81)
RDW: 15.6 % — ABNORMAL HIGH (ref 11.5–15.5)
WBC: 11.1 10*3/uL — ABNORMAL HIGH (ref 4.0–10.5)
nRBC: 0 % (ref 0.0–0.2)

## 2023-02-26 LAB — COMPREHENSIVE METABOLIC PANEL
ALT: 12 U/L (ref 0–44)
AST: 32 U/L (ref 15–41)
Albumin: 2.3 g/dL — ABNORMAL LOW (ref 3.5–5.0)
Alkaline Phosphatase: 203 U/L — ABNORMAL HIGH (ref 38–126)
Anion gap: 11 (ref 5–15)
BUN: 29 mg/dL — ABNORMAL HIGH (ref 8–23)
CO2: 25 mmol/L (ref 22–32)
Calcium: 9.1 mg/dL (ref 8.9–10.3)
Chloride: 100 mmol/L (ref 98–111)
Creatinine, Ser: 0.89 mg/dL (ref 0.61–1.24)
GFR, Estimated: >60 mL/min (ref >60)
Glucose, Bld: 116 mg/dL — ABNORMAL HIGH (ref 70–99)
Potassium: 3.8 mmol/L (ref 3.5–5.1)
Sodium: 136 mmol/L (ref 135–145)
Total Bilirubin: 0.6 mg/dL (ref 0.0–1.2)
Total Protein: 6.1 g/dL — ABNORMAL LOW (ref 6.5–8.1)

## 2023-02-26 LAB — LACTIC ACID, PLASMA
Lactic Acid, Venous: 1.8 mmol/L (ref 0.5–1.9)
Lactic Acid, Venous: 1.7 mmol/L (ref 0.5–1.9)

## 2023-02-26 MED ORDER — FENTANYL CITRATE PF 50 MCG/ML IJ SOSY
50.0000 ug | PREFILLED_SYRINGE | Freq: Once | INTRAMUSCULAR | Status: AC
  Administered 2023-02-26: 50 ug via INTRAVENOUS
  Filled 2023-02-26: qty 1

## 2023-02-26 MED ORDER — SODIUM CHLORIDE 0.9 % IV SOLN
500.0000 mg | INTRAVENOUS | Status: DC
  Administered 2023-02-27: 500 mg via INTRAVENOUS
  Filled 2023-02-26: qty 5

## 2023-02-26 MED ORDER — IOHEXOL 350 MG/ML SOLN
100.0000 mL | Freq: Once | INTRAVENOUS | Status: AC | PRN
  Administered 2023-02-26: 100 mL via INTRAVENOUS

## 2023-02-26 MED ORDER — SODIUM CHLORIDE 0.9 % IV SOLN
1.0000 g | Freq: Once | INTRAVENOUS | Status: AC
  Administered 2023-02-26: 1 g via INTRAVENOUS
  Filled 2023-02-26: qty 10

## 2023-02-26 MED ORDER — SODIUM CHLORIDE 0.9 % IV BOLUS
1000.0000 mL | Freq: Once | INTRAVENOUS | Status: AC
  Administered 2023-02-26: 1000 mL via INTRAVENOUS

## 2023-02-26 NOTE — ED Provider Notes (Signed)
[place] [facility] DEPARTMENT AT [facility] Provider Note   CSN: [id number] Arrival date & time: 02/26/23  1655     History  Chief Complaint  Patient presents with   Back Pain    Joseph Hogan is a 63 y.o. male.  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  He was diagnosed after he had a pathologic hip fracture December 2023 and had left total hip arthroplasty after that, underwent CT scan of abdomen pelvis, subsequent colonoscopy and started chemotherapy and February 2024.   He presents to the ER today complaining of left leg pain, fatigue, feeling generally unwell.  He has a sacral decubitus ulcer, states is getting worse because he is not able to get out of his recliner much at all due to his generalized weakness.  He has missed his last chemotherapy due to pain.  He states he is on controlled release and immediate release pain medicine, he thinks he is perhaps on too much pain medicine which could be causing his sleepiness.   Back Pain      Home Medications Prior to Admission medications   Medication Sig Start Date End Date Taking? Authorizing Provider  cyanocobalamin (VITAMIN B12) 1000 MCG tablet Take 1 tablet (1,000 mcg total) by mouth daily. 10/20/22   Kathlen Mody, MD  doxycycline (VIBRA-TABS) 100 MG tablet Take 1 tablet (100 mg total) by mouth 2 (two) times daily. 11/07/22   Briant Cedar, PA-C  fluconazole (DIFLUCAN) 150 MG tablet Take 1 tablet (150 mg total) by mouth daily. 12/23/22   Ronny Bacon, PA-C  gabapentin (NEURONTIN) 300 MG capsule Take 1 capsule (300 mg total) by mouth at bedtime. Patient not taking: Reported on 07/31/2022 06/06/22   [name], MD  lidocaine-prilocaine (EMLA) cream Apply a quarter-sized amount to port a cath site and cover with plastic wrap one hour prior to infusion appointments Patient not taking: Reported on 09/11/2022 08/27/22   [name], MD  magnesium oxide (MAG-OX) 400 (240 Mg)  MG tablet Take 1 tablet (400 mg total) by mouth daily. 11/21/22   Pollyann Samples, NP  ondansetron (ZOFRAN) 8 MG tablet Take 2 tablets (16 mg total) by mouth every 8 (eight) hours as needed for nausea or vomiting. Patient not taking: Reported on 08/14/2022 08/02/22   [name], MD  oxyCODONE ER [facility]) 36 MG C12A Take 1 capsule (36 mg total) by mouth every 12 (twelve) hours. 01/30/23   Pickenpack-Cousar, Arty Baumgartner, NP  Oxycodone HCl 10 MG TABS Take 1 tablet (10 mg total) by mouth every 6 (six) hours as needed. 01/30/23   Pickenpack-Cousar, Arty Baumgartner, NP  pantoprazole (PROTONIX) 20 MG tablet Take 1 tablet (20 mg total) by mouth daily. 12/30/22   [name], MD  polyethylene glycol (MIRALAX / GLYCOLAX) 17 g packet Take 17 g by mouth daily as needed for mild constipation. 10/20/22   Kathlen Mody, MD  potassium chloride (KLOR-CON M) 10 MEQ tablet Take 1 tablet (10 mEq total) by mouth daily. 11/21/22   Pollyann Samples, NP  senna-docusate (SENOKOT-S) 8.6-50 MG tablet Take 1 tablet by mouth 2 (two) times daily. 10/20/22   Kathlen Mody, MD  silver sulfADIAZINE (SILVADENE) 1 % cream Apply 1 Application topically daily. 01/03/23   [name], MD      Allergies    Codeine, Dilaudid [hydromorphone hcl], and Lisinopril    Review of Systems   Review  of Systems  Musculoskeletal:  Positive for back pain.    Physical Exam Updated Vital Signs BP 121/89   Pulse (!) 102   Temp 98.3 F (36.8 C) (Oral)   Ht 5\' 11"  (1.803 m)   Wt 80 kg   SpO2 100%   BMI 24.60 kg/m  Physical Exam Vitals and nursing note reviewed.  Constitutional:      General: He is not in acute distress.    Appearance: He is well-developed.  HENT:     Head: Normocephalic and atraumatic.     Mouth/Throat:     Mouth: Mucous membranes are dry.  Eyes:     Extraocular Movements: Extraocular movements intact.     Conjunctiva/sclera: Conjunctivae normal.     Pupils: Pupils are equal, round, and reactive to light.   Cardiovascular:     Rate and Rhythm: Normal rate and regular rhythm.     Heart sounds: No murmur heard. Pulmonary:     Effort: Pulmonary effort is normal. No respiratory distress.     Breath sounds: Normal breath sounds.  Abdominal:     Palpations: Abdomen is soft.     Tenderness: There is no abdominal tenderness.  Musculoskeletal:        General: No swelling.     Cervical back: Neck supple.     Comments: Pitting edema left lower leg.  DP PT pulses intact bilaterally.  Patient has tenderness to left calf.  Skin:    General: Skin is warm and dry.     Capillary Refill: Capillary refill takes less than 2 seconds.     Comments: Patient has small clean sacral decubitus ulcer  Neurological:     General: No focal deficit present.     Mental Status: He is alert and oriented to person, place, and time.  Psychiatric:        Mood and Affect: Mood normal.     ED Results / Procedures / Treatments   Labs (all labs ordered are listed, but only abnormal results are displayed) Labs Reviewed  CULTURE, BLOOD (ROUTINE X 2)  CULTURE, BLOOD (ROUTINE X 2)  CBC WITH DIFFERENTIAL/PLATELET  COMPREHENSIVE METABOLIC PANEL  URINALYSIS, ROUTINE W REFLEX MICROSCOPIC  LACTIC ACID, PLASMA  LACTIC ACID, PLASMA    EKG None  Radiology No results found.  Procedures Procedures    Medications Ordered in ED Medications  sodium chloride 0.9 % bolus 1,000 mL (has no administration in time range)    ED Course/ Medical Decision Making/ A&P                                 Medical Decision Making Differential diagnosis includes but not limited to sepsis, DVT, PE, fracture, metastasis, other  ED course: Patient chronically ill due to his metastatic rectal cancer, has missed his last several chemo treatments, states he has not been able to get out of his chair, he is having left leg pain and has swelling concerning for possible DVT.  Denies chest pain or shortness of breath at this time but states he  is just been very fatigued and tired overall and feels dehydrated, has had decreased p.o. intake.  He appears dehydrated on exam.  He has a sacral decubitus ulcer but this does not appear to be infected.  Awaiting labs and a CT.  Also get a PE study given his tachycardia and suspicion of possible DVT in his left leg.    Signed out  to [employer], PA-C at this time.  Amount and/or Complexity of Data Reviewed Labs: ordered. Radiology: ordered.           Final Clinical Impression(s) / ED Diagnoses Final diagnoses:  None    Rx / DC Orders ED Discharge Orders     None         Ma Rings, PA-C 02/26/23 1927    Loetta Rough, MD 03/02/23 [phone]

## 2023-02-26 NOTE — Telephone Encounter (Signed)
 TCT patient regarding his appts.  No answer and his vm is full so no message could left for him. TCT pt's son, Joseph, Hogan.  Spoke with him. Asked him what we could do to help his dad as he hasn't come to his last 3 appts.  Joseph Hogan, Joseph Hogan states his dad is on his way to the ED  as we speak. He wasn't sure if he was going to come here to [facility] or [facility]. Advised to keep us  up to date on his dad's situation. He said he would.

## 2023-02-26 NOTE — Telephone Encounter (Signed)
 spoke with pt's son and pt is not wanting to come in today. Daughter is with him now and I gave them my number to call and let me know what is going on  Spoke with Nellie Banas and referred her to scheduling to reschedule today's appt

## 2023-02-26 NOTE — ED Triage Notes (Signed)
 Ems called out for sick call. Pt c/o dehydration, leg, back and neck pain.

## 2023-02-26 NOTE — ED Provider Notes (Signed)
Patient signed out to me by Carmel Sacramento, PA-C pending completion of patient's workup including laboratory studies and CT imaging.  Patient with history of rectal cancer with bony metastasis currently followed by oncology at [facility] here with back pain, left leg pain and generally feeling unwell.  Patient also complains of generalized weakness and a has missed several of his last chemotherapy appointments.  See previous provider note for complete H&P  Workup this evening without evidence of PE.  Patient does have significant swelling of the left lower extremity on my exam.  This is concerning for possible DVT.  Unfortunately ultrasound imaging unavailable at in the evening hours.  CT angio of the chest without evidence of PE.  Generalized weakness and questionable developing infection of the right upper lobe seen on CT.  IV Zithromax Rocephin ordered.    Concerns for prostatitis/ cystitis on CT imaging felt to be secondary to radiation, urinalysis here without evidence of infection.  Patient had a last radiation treatment approximately 1 month ago.  Will consult hospitalist for admission  Discussed with [facility] hospitalist, Dr. Lazarus Salines who agrees to admit   CT L-SPINE NO CHARGE Result Date: 02/26/2023 CLINICAL DATA:  Rectal cancer.  Back pain. EXAM: CT LUMBAR SPINE WITHOUT CONTRAST TECHNIQUE: Multidetector CT imaging of the lumbar spine was performed without intravenous contrast administration. Multiplanar CT image reconstructions were also generated. RADIATION DOSE REDUCTION: This exam was performed according to the departmental dose-optimization program which includes automated exposure control, adjustment of the mA and/or kV according to patient size and/or use of iterative reconstruction technique. COMPARISON:  CT lumbar spine 10/14/2022 FINDINGS: Segmentation: 5 lumbar type vertebrae. Alignment: Normal. Vertebrae: Multilevel mild degenerative changes of the spine. Posterior disc  osteophyte complex formation at the L5-S1 level. Scattered axial and appendicular mix lytic and sclerotic metastasis static lesions. No acute fracture. Paraspinal and other soft tissues: Negative. Disc levels: Maintained. IMPRESSION: 1. Scattered axial and appendicular mix lytic and sclerotic metastasis lesions. 2. No acute displaced fracture or traumatic listhesis of the lumbar spine. 3. Please see separately dictated CT chest, abdomen, pelvis 10/14/2022. Electronically Signed   By: Tish Frederickson M.D.   On: 02/26/2023 22:49   CT Angio Chest PE W and/or Wo Contrast Addendum Date: 02/26/2023 ADDENDUM REPORT: 02/26/2023 22:46 ADDENDUM: The proctitis and cystitis may be due to radiation changes; however, infection is not excluded. Electronically Signed   By: Tish Frederickson M.D.   On: 02/26/2023 22:46   Result Date: 02/26/2023 CLINICAL DATA:  Abdominal pain, acute, nonlocalized; Pulmonary embolism (PE) suspected, high prob sick call. Pt c/o dehydration, leg, back and neck pain. EXAM: CT ANGIOGRAPHY CHEST CT ABDOMEN AND PELVIS WITH CONTRAST TECHNIQUE: Multidetector CT imaging of the chest was performed using the standard protocol during bolus administration of intravenous contrast. Multiplanar CT image reconstructions and MIPs were obtained to evaluate the vascular anatomy. Multidetector CT imaging of the abdomen and pelvis was performed using the standard protocol during bolus administration of intravenous contrast. RADIATION DOSE REDUCTION: This exam was performed according to the departmental dose-optimization program which includes automated exposure control, adjustment of the mA and/or kV according to patient size and/or use of iterative reconstruction technique. CONTRAST:  OMNIPAQUE IOHEXOL 350 MG/ML SOLN COMPARISON:  CT chest, abdomen, pelvis 10/14/2022, PET CT 07/11/2022, CT chest abdomen pelvis 02/05/2022 FINDINGS: CTA CHEST FINDINGS Cardiovascular: Right chest wall Port-A-Cath with tip in the  right atrium. Satisfactory opacification of the pulmonary arteries to the segmental level. No evidence of pulmonary embolism. Normal  heart size. No significant pericardial effusion. The thoracic aorta is normal in caliber. No atherosclerotic plaque of the thoracic aorta. No coronary artery calcifications. Mediastinum/Nodes: Interval development of bilateral hilar and mediastinal lymphadenopathy. No enlarged axillary lymph nodes. Thyroid gland, trachea, and esophagus demonstrate no significant findings. Lungs/Pleura: Emphysematous changes. Question development of tree-in-bud nodularity is within the inferior peripheral aspect of the right upper lobe. Stable right lower lobe subpleural calcified nodule. No pulmonary mass. No pleural effusion. No pneumothorax. Musculoskeletal: No chest wall abnormality. Scattered axial and appendicular mix lytic and sclerotic metastasis static lesions. Interval increase in size of multiple expansile lytic rib lesions with as an example a 4.9 x 3.5 cm (from 3.9 x 2.1 cm). Interval development of a T4 sclerotic vertebral body lesion. Partially visualized right humeral surgical hardware. No definite acute displaced a pathologic fracture. Review of the MIP images confirms the above findings. CT ABDOMEN and PELVIS FINDINGS Hepatobiliary: No focal liver abnormality. No gallstones, gallbladder wall thickening, or pericholecystic fluid. No biliary dilatation. Pancreas: Diffusely atrophic. No focal lesion. Otherwise normal pancreatic contour. No surrounding inflammatory changes. No main pancreatic ductal dilatation. Spleen: Normal in size without focal abnormality. Adrenals/Urinary Tract: No adrenal nodule bilaterally. Bilateral kidneys enhance symmetrically. Fluid density lesions likely represent simple renal cysts. Simple renal cysts, in the absence of clinically indicated signs/symptoms, require no independent follow-up. No hydronephrosis. No hydroureter. The urinary bladder is distended  with urine. Circumferential urinary bladder wall thickening. Stomach/Bowel: Stomach is within normal limits. No evidence of small bowel wall thickening or dilatation. Interval development of right tool wall thickening and mucosal hyperemia with perirectal fat stranding. Appendix appears normal. Vascular/Lymphatic: No abdominal aorta or iliac aneurysm. Mild atherosclerotic plaque of the aorta and its branches. No abdominal, pelvic, or inguinal lymphadenopathy. Reproductive: Prostate is unremarkable. Other: No intraperitoneal free fluid. No intraperitoneal free gas. No organized fluid collection. Musculoskeletal: No abdominal wall hernia or abnormality. Stable to slightly increased in size expansile pedunculated lesion arising from the left inferior pubic rami. Scattered axial and appendicular mix lytic and sclerotic metastasis static lesions. Total left hip arthroplasty. No definite pathologic fracture. Please see separately dictated CT lumbar spine 02/26/2023. Review of the MIP images confirms the above findings. IMPRESSION: 1. No pulmonary embolus. 2. Question development of tree-in-bud nodularity is within the inferior peripheral aspect of the right upper lobe. Finding could represent developing infection. 3. Interval development of bilateral hilar and mediastinal lymphadenopathy. 4. Interval development of proctitis. Recommend colonoscopy status post treatment and status post complete resolution of inflammatory changes to exclude an underlying lesion. 5. Interval development of circumferential urinary bladder wall thickening. Correlate with urinalysis for infection. 6. Scattered axial and appendicular mix lytic and sclerotic metastasis static lesions. table to slightly increased in size expansile pedunculated lesion arising from the left inferior pubic rami. Interval increase in size of multiple expansile lytic rib lesions with as an example a 4.9 x 3.5 cm (from 3.9 x 2.1 cm). Interval development of a T4  sclerotic vertebral body lesion. 7. No definite pathologic fracture. Electronically Signed: By: Tish Frederickson M.D. On: 02/26/2023 22:42   CT ABDOMEN PELVIS W CONTRAST Addendum Date: 02/26/2023 ADDENDUM REPORT: 02/26/2023 22:46 ADDENDUM: The proctitis and cystitis may be due to radiation changes; however, infection is not excluded. Electronically Signed   By: Tish Frederickson M.D.   On: 02/26/2023 22:46   Result Date: 02/26/2023 CLINICAL DATA:  Abdominal pain, acute, nonlocalized; Pulmonary embolism (PE) suspected, high prob sick call. Pt c/o dehydration, leg, back and neck pain.  EXAM: CT ANGIOGRAPHY CHEST CT ABDOMEN AND PELVIS WITH CONTRAST TECHNIQUE: Multidetector CT imaging of the chest was performed using the standard protocol during bolus administration of intravenous contrast. Multiplanar CT image reconstructions and MIPs were obtained to evaluate the vascular anatomy. Multidetector CT imaging of the abdomen and pelvis was performed using the standard protocol during bolus administration of intravenous contrast. RADIATION DOSE REDUCTION: This exam was performed according to the departmental dose-optimization program which includes automated exposure control, adjustment of the mA and/or kV according to patient size and/or use of iterative reconstruction technique. CONTRAST:  OMNIPAQUE IOHEXOL 350 MG/ML SOLN COMPARISON:  CT chest, abdomen, pelvis 10/14/2022, PET CT 07/11/2022, CT chest abdomen pelvis 02/05/2022 FINDINGS: CTA CHEST FINDINGS Cardiovascular: Right chest wall Port-A-Cath with tip in the right atrium. Satisfactory opacification of the pulmonary arteries to the segmental level. No evidence of pulmonary embolism. Normal heart size. No significant pericardial effusion. The thoracic aorta is normal in caliber. No atherosclerotic plaque of the thoracic aorta. No coronary artery calcifications. Mediastinum/Nodes: Interval development of bilateral hilar and mediastinal lymphadenopathy. No  enlarged axillary lymph nodes. Thyroid gland, trachea, and esophagus demonstrate no significant findings. Lungs/Pleura: Emphysematous changes. Question development of tree-in-bud nodularity is within the inferior peripheral aspect of the right upper lobe. Stable right lower lobe subpleural calcified nodule. No pulmonary mass. No pleural effusion. No pneumothorax. Musculoskeletal: No chest wall abnormality. Scattered axial and appendicular mix lytic and sclerotic metastasis static lesions. Interval increase in size of multiple expansile lytic rib lesions with as an example a 4.9 x 3.5 cm (from 3.9 x 2.1 cm). Interval development of a T4 sclerotic vertebral body lesion. Partially visualized right humeral surgical hardware. No definite acute displaced a pathologic fracture. Review of the MIP images confirms the above findings. CT ABDOMEN and PELVIS FINDINGS Hepatobiliary: No focal liver abnormality. No gallstones, gallbladder wall thickening, or pericholecystic fluid. No biliary dilatation. Pancreas: Diffusely atrophic. No focal lesion. Otherwise normal pancreatic contour. No surrounding inflammatory changes. No main pancreatic ductal dilatation. Spleen: Normal in size without focal abnormality. Adrenals/Urinary Tract: No adrenal nodule bilaterally. Bilateral kidneys enhance symmetrically. Fluid density lesions likely represent simple renal cysts. Simple renal cysts, in the absence of clinically indicated signs/symptoms, require no independent follow-up. No hydronephrosis. No hydroureter. The urinary bladder is distended with urine. Circumferential urinary bladder wall thickening. Stomach/Bowel: Stomach is within normal limits. No evidence of small bowel wall thickening or dilatation. Interval development of right tool wall thickening and mucosal hyperemia with perirectal fat stranding. Appendix appears normal. Vascular/Lymphatic: No abdominal aorta or iliac aneurysm. Mild atherosclerotic plaque of the aorta and its  branches. No abdominal, pelvic, or inguinal lymphadenopathy. Reproductive: Prostate is unremarkable. Other: No intraperitoneal free fluid. No intraperitoneal free gas. No organized fluid collection. Musculoskeletal: No abdominal wall hernia or abnormality. Stable to slightly increased in size expansile pedunculated lesion arising from the left inferior pubic rami. Scattered axial and appendicular mix lytic and sclerotic metastasis static lesions. Total left hip arthroplasty. No definite pathologic fracture. Please see separately dictated CT lumbar spine 02/26/2023. Review of the MIP images confirms the above findings. IMPRESSION: 1. No pulmonary embolus. 2. Question development of tree-in-bud nodularity is within the inferior peripheral aspect of the right upper lobe. Finding could represent developing infection. 3. Interval development of bilateral hilar and mediastinal lymphadenopathy. 4. Interval development of proctitis. Recommend colonoscopy status post treatment and status post complete resolution of inflammatory changes to exclude an underlying lesion. 5. Interval development of circumferential urinary bladder wall thickening. Correlate with urinalysis  for infection. 6. Scattered axial and appendicular mix lytic and sclerotic metastasis static lesions. table to slightly increased in size expansile pedunculated lesion arising from the left inferior pubic rami. Interval increase in size of multiple expansile lytic rib lesions with as an example a 4.9 x 3.5 cm (from 3.9 x 2.1 cm). Interval development of a T4 sclerotic vertebral body lesion. 7. No definite pathologic fracture. Electronically Signed: By: Tish Frederickson M.D. On: 02/26/2023 22:42          Pauline Aus, PA-C 02/27/23 0035    Loetta Rough, MD 03/02/23 [phone]

## 2023-02-26 NOTE — ED Notes (Signed)
 Patient transported to CT

## 2023-02-27 ENCOUNTER — Observation Stay (HOSPITAL_COMMUNITY): Payer: Medicare HMO

## 2023-02-27 ENCOUNTER — Encounter (HOSPITAL_COMMUNITY): Payer: Self-pay | Admitting: Internal Medicine

## 2023-02-27 DIAGNOSIS — Z7189 Other specified counseling: Secondary | ICD-10-CM | POA: Diagnosis not present

## 2023-02-27 DIAGNOSIS — M1712 Unilateral primary osteoarthritis, left knee: Secondary | ICD-10-CM | POA: Diagnosis not present

## 2023-02-27 DIAGNOSIS — Z515 Encounter for palliative care: Secondary | ICD-10-CM | POA: Diagnosis not present

## 2023-02-27 DIAGNOSIS — C7951 Secondary malignant neoplasm of bone: Secondary | ICD-10-CM

## 2023-02-27 DIAGNOSIS — C799 Secondary malignant neoplasm of unspecified site: Secondary | ICD-10-CM | POA: Diagnosis present

## 2023-02-27 DIAGNOSIS — R600 Localized edema: Secondary | ICD-10-CM | POA: Diagnosis not present

## 2023-02-27 LAB — HIV ANTIBODY (ROUTINE TESTING W REFLEX): HIV Screen 4th Generation wRfx: NONREACTIVE

## 2023-02-27 LAB — CBC
HCT: 22.2 % — ABNORMAL LOW (ref 39.0–52.0)
Hemoglobin: 6.9 g/dL — CL (ref 13.0–17.0)
MCH: 27.4 pg (ref 26.0–34.0)
MCHC: 31.1 g/dL (ref 30.0–36.0)
MCV: 88.1 fL (ref 80.0–100.0)
Platelets: 402 10*3/uL — ABNORMAL HIGH (ref 150–400)
RBC: 2.52 MIL/uL — ABNORMAL LOW (ref 4.22–5.81)
RDW: 15.8 % — ABNORMAL HIGH (ref 11.5–15.5)
WBC: 8.5 10*3/uL (ref 4.0–10.5)
nRBC: 0 % (ref 0.0–0.2)

## 2023-02-27 LAB — BASIC METABOLIC PANEL
Anion gap: 9 (ref 5–15)
BUN: 25 mg/dL — ABNORMAL HIGH (ref 8–23)
CO2: 24 mmol/L (ref 22–32)
Calcium: 8.6 mg/dL — ABNORMAL LOW (ref 8.9–10.3)
Chloride: 103 mmol/L (ref 98–111)
Creatinine, Ser: 0.74 mg/dL (ref 0.61–1.24)
GFR, Estimated: >60 mL/min (ref >60)
Glucose, Bld: 129 mg/dL — ABNORMAL HIGH (ref 70–99)
Potassium: 3.8 mmol/L (ref 3.5–5.1)
Sodium: 136 mmol/L (ref 135–145)

## 2023-02-27 LAB — FERRITIN: Ferritin: 2921 ng/mL — ABNORMAL HIGH (ref 24–336)

## 2023-02-27 LAB — MAGNESIUM: Magnesium: 1.8 mg/dL (ref 1.7–2.4)

## 2023-02-27 LAB — IRON AND TIBC
Iron: 25 ug/dL — ABNORMAL LOW (ref 45–182)
Saturation Ratios: 21 % (ref 17.9–39.5)
TIBC: 117 ug/dL — ABNORMAL LOW (ref 250–450)
UIBC: 92 ug/dL

## 2023-02-27 LAB — VITAMIN B12: Vitamin B-12: 469 pg/mL (ref 180–914)

## 2023-02-27 LAB — HEMOGLOBIN AND HEMATOCRIT, BLOOD
HCT: 22 % — ABNORMAL LOW (ref 39.0–52.0)
Hemoglobin: 6.8 g/dL — CL (ref 13.0–17.0)

## 2023-02-27 LAB — TRANSFERRIN: Transferrin: 84 mg/dL — ABNORMAL LOW (ref 180–329)

## 2023-02-27 LAB — PHOSPHORUS: Phosphorus: 3.9 mg/dL (ref 2.5–4.6)

## 2023-02-27 LAB — FOLATE: Folate: 10.3 ng/mL (ref >5.9)

## 2023-02-27 LAB — PREPARE RBC (CROSSMATCH)

## 2023-02-27 MED ORDER — ENOXAPARIN SODIUM 40 MG/0.4ML IJ SOSY
40.0000 mg | PREFILLED_SYRINGE | INTRAMUSCULAR | Status: DC
  Administered 2023-02-27 – 2023-03-06 (×8): 40 mg via SUBCUTANEOUS
  Filled 2023-02-27 (×8): qty 0.4

## 2023-02-27 MED ORDER — OXYCODONE HCL 5 MG PO TABS: 5.0000 mg | ORAL_TABLET | Freq: Four times a day (QID) | ORAL | Status: DC | PRN

## 2023-02-27 MED ORDER — SENNOSIDES-DOCUSATE SODIUM 8.6-50 MG PO TABS
1.0000 | ORAL_TABLET | Freq: Two times a day (BID) | ORAL | Status: DC
  Administered 2023-02-27 – 2023-03-06 (×11): 1 via ORAL
  Filled 2023-02-27 (×15): qty 1

## 2023-02-27 MED ORDER — OXYCODONE HCL ER 20 MG PO T12A
40.0000 mg | EXTENDED_RELEASE_TABLET | Freq: Two times a day (BID) | ORAL | Status: DC
  Administered 2023-02-27 – 2023-03-06 (×16): 40 mg via ORAL
  Filled 2023-02-27 (×7): qty 2
  Filled 2023-02-27: qty 4
  Filled 2023-02-27: qty 1
  Filled 2023-02-27 (×2): qty 2
  Filled 2023-02-27: qty 1
  Filled 2023-02-27 (×4): qty 2

## 2023-02-27 MED ORDER — VITAMIN B-12 1000 MCG PO TABS
1000.0000 ug | ORAL_TABLET | Freq: Every day | ORAL | Status: DC
  Administered 2023-02-27 – 2023-03-06 (×8): 1000 ug via ORAL
  Filled 2023-02-27 (×8): qty 1

## 2023-02-27 MED ORDER — SODIUM CHLORIDE 0.9% IV SOLUTION: Freq: Once | INTRAVENOUS | Status: AC

## 2023-02-27 MED ORDER — HYDROMORPHONE HCL 1 MG/ML IJ SOLN
0.5000 mg | INTRAMUSCULAR | Status: DC | PRN
  Administered 2023-02-28 – 2023-03-04 (×8): 0.5 mg via INTRAVENOUS
  Filled 2023-02-27 (×9): qty 0.5

## 2023-02-27 MED ORDER — SODIUM CHLORIDE 0.9 % IV BOLUS
500.0000 mL | Freq: Once | INTRAVENOUS | Status: AC
  Administered 2023-02-27: 500 mL via INTRAVENOUS

## 2023-02-27 MED ORDER — FENTANYL CITRATE PF 50 MCG/ML IJ SOSY
50.0000 ug | PREFILLED_SYRINGE | INTRAMUSCULAR | Status: DC | PRN
  Administered 2023-03-04 – 2023-03-05 (×9): 50 ug via INTRAVENOUS
  Filled 2023-02-27 (×9): qty 1

## 2023-02-27 MED ORDER — TAMSULOSIN HCL 0.4 MG PO CAPS
0.4000 mg | ORAL_CAPSULE | Freq: Every day | ORAL | Status: DC
  Administered 2023-02-27 – 2023-03-05 (×7): 0.4 mg via ORAL
  Filled 2023-02-27 (×7): qty 1

## 2023-02-27 MED ORDER — OXYCODONE HCL 5 MG PO TABS
10.0000 mg | ORAL_TABLET | Freq: Four times a day (QID) | ORAL | Status: DC | PRN
  Administered 2023-02-28 – 2023-03-06 (×7): 10 mg via ORAL
  Filled 2023-02-27 (×7): qty 2

## 2023-02-27 MED ORDER — SODIUM CHLORIDE 0.9% FLUSH
3.0000 mL | Freq: Two times a day (BID) | INTRAVENOUS | Status: DC
  Administered 2023-02-27 – 2023-03-06 (×12): 3 mL via INTRAVENOUS

## 2023-02-27 MED ORDER — ACETAMINOPHEN 500 MG PO TABS
1000.0000 mg | ORAL_TABLET | Freq: Four times a day (QID) | ORAL | Status: DC | PRN
  Administered 2023-03-03 – 2023-03-06 (×2): 1000 mg via ORAL
  Filled 2023-02-27 (×2): qty 2

## 2023-02-27 NOTE — Consult Note (Signed)
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  15% devitalized tissue, unable to assess deepest part of wound bed.  Will implement topical treatment to debride.  Recent decline in mobility and patient states wound has declined recently.  Wound type:unstageable pressure injury Pressure Injury POA: Yes Measurement: 2.5 cm x 4 cm slough in wound bed Wound bed: some yellow fibrinous slough Drainage (amount, consistency, odor) minimal serosanguinous   Periwound: intact  Dressing procedure/placement/frequency:Cleanse sacral wound with VASHE wound cleanser  ([facility] # [id number])  Apply VASHE moist gauze and top with foam   Change daily.  Will not follow at this time.  Please re-consult if needed.  Mike Gip MSN, RN, FNP-BC CWON Wound, Ostomy, Continence Nurse Outpatient [facility] [phone] Pager [phone]

## 2023-02-27 NOTE — ED Notes (Signed)
X-ray at bedside

## 2023-02-27 NOTE — Care Management Obs Status (Signed)
MEDICARE OBSERVATION STATUS NOTIFICATION   Patient Details  Name: Joseph Hogan MRN: [id number] Date of Birth: 1960/11/03   Medicare Observation Status Notification Given:  Yes Pearletha Furl., CMA, reviewed observation notice telephonically with [name]. at [phone]. Consent for verbal signature provided, copy mailed certified and copy left at bedside.)    [name] 02/27/2023, 4:35 PM

## 2023-02-27 NOTE — H&P (Addendum)
History and Physical    GABLE PERCH OZH:[id number] DOB: 08-Oct-1960 DOA: 02/26/2023  PCP: [name], MD   Patient coming from: Home   Chief Complaint:  Chief Complaint  Patient presents with   Back Pain    HPI:  Joseph Hogan is a 63 y.o. male with hx of metastatic rectal cancer, diffuse bony mets, on chemotherapy with FOLFIRI/panitumumab, recently completed palliative radiation treatment, diabetes, hypertension, hyperlipidemia, who was brought in by EMS from home due to left leg pain and worsening weakness.  He reports ongoing cancer related pain which is not controlled with his home medications.  He has been taking his home long-acting oxycodone ER however not using his IR medications due to concern this is resulting in side effects including more somnolence and intermittent hallucinations.  He has been having worsening left leg pain ongoing for the past few weeks.  Reports the pain radiates down posterior thigh on the left.  There is swelling in both lower extremities which waxes and wanes.  Currently asymmetric on the left.  No history of VTE in the past.  Typically ambulates with the use of the walker but has not been able to get up as much due to pain.  Denies any numbness or tingling.  Does feel weak in both legs.  Otherwise denies any recent cough/cold, shortness of breath, chest pain.  Does report sometimes having issues with swallowing but takes in a regular diet.  Review of Systems:  ROS complete and negative except as marked above   Allergies  Allergen Reactions   Codeine Nausea And Vomiting   Dilaudid [Hydromorphone Hcl] Nausea And Vomiting    Reaction only present for IM administrations. IV and PO admins are effective.    Lisinopril Cough    Prior to Admission medications   Medication Sig Start Date End Date Taking? Authorizing Provider  cyanocobalamin (VITAMIN B12) 1000 MCG tablet Take 1 tablet (1,000 mcg total) by mouth daily. 10/20/22  Yes Kathlen Mody, MD   oxyCODONE ER [facility] ER) 36 MG C12A Take 1 capsule (36 mg total) by mouth every 12 (twelve) hours. 01/30/23  Yes Pickenpack-Cousar, Arty Baumgartner, NP  Oxycodone HCl 10 MG TABS Take 1 tablet (10 mg total) by mouth every 6 (six) hours as needed. Patient taking differently: Take 10 mg by mouth every 6 (six) hours as needed (pain). 01/30/23  Yes Pickenpack-Cousar, Arty Baumgartner, NP  potassium chloride (KLOR-CON M) 10 MEQ tablet Take 1 tablet (10 mEq total) by mouth daily. 11/21/22  Yes Pollyann Samples, NP  senna-docusate (SENOKOT-S) 8.6-50 MG tablet Take 1 tablet by mouth 2 (two) times daily. 10/20/22  Yes Kathlen Mody, MD  silver sulfADIAZINE (SILVADENE) 1 % cream Apply 1 Application topically daily. 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   with abnormal R wave progression noted on EKG   MRSA (methicillin resistant Staphylococcus aureus)    Pre-diabetes    Rectal carcinoma (HCC) 03/19/2022    Past Surgical History:  Procedure Laterality Date   COLONOSCOPY     HUMERUS IM NAIL Right 04/12/2022   Procedure: INTRAMEDULLARY (IM) NAIL HUMERAL;  Surgeon: Bjorn Pippin, MD;  Location: [facility];  Service: Orthopedics;  Laterality: Right;   IR IMAGING GUIDED PORT INSERTION  04/05/2022   TOTAL HIP ARTHROPLASTY Left 02/06/2022   Procedure:  TOTAL HIP ARTHROPLASTY;  Surgeon: Joen Laura, MD;  Location: [facility];  Service: Orthopedics;  Laterality: Left;     reports that he has never smoked. He has never used smokeless tobacco. He reports that he does not drink alcohol and does not use drugs.  Family History  Problem Relation Age of Onset   Stroke Mother    Hypertension Mother    Heart disease Father    Alzheimer's disease Father    Hypertension Brother    Alcohol abuse Brother    Cancer Maternal Aunt    Heart disease Maternal Aunt    Cancer Maternal Uncle     Heart disease Maternal Uncle    Cancer Paternal Aunt    Heart disease Paternal Aunt    Cancer Paternal Uncle    Heart disease Paternal Uncle    Heart disease Paternal Grandmother    Alzheimer's disease Paternal Grandfather    Stomach cancer Neg Hx    Rectal cancer Neg Hx    Esophageal cancer Neg Hx    Colon cancer Neg Hx      Physical Exam: Vitals:   02/27/23 0200 02/27/23 0300 02/27/23 0400 02/27/23 0600  BP: 105/81 117/76 114/64 109/66  Pulse: 87 79 81 80  Resp: 15 17 15 17   Temp:    98.1 F (36.7 C)  TempSrc:    Oral  SpO2: 100% 100% 99% 98%  Weight:      Height:        Gen: Awake, alert, chronically ill-appearing, temporal wasting CV: Regular, normal S1, S2, 1/6 SEM Resp: Normal WOB, CTAB  Abd: Flat, normoactive, nontender MSK: Asymmetric with 3+ pitting edema on the left, 2+ on the right.   Skin: Reports worsening sacral ulcer, not rolled for exam.  No rashes or lesions to exposed skin  Neuro: Alert and interactive, strength is 4+ out of 5 and symmetric in both lower extremities, sensation is intact L1-S1 Psych: euthymic, appropriate    Data review:   Labs reviewed, notable for:   Lactate 1.8 -> 1.7 Creatinine 0.8, up from baseline 0.5 Alk phos persistently elevated to 3 WBC 11 Hemoglobin 7.9  Micro:  Results for orders placed or performed during the hospital encounter of 02/26/23  Blood culture (routine x 2)     Status: None (Preliminary result)   Collection Time: 02/26/23  7:59 PM   Specimen: Porta Cath; Blood  Result Value Ref Range Status   Specimen Description PORTA CATH BOTTLES DRAWN AEROBIC AND ANAEROBIC  Final   Special Requests Blood Culture adequate volume  Final   Culture   Final    NO GROWTH < 12 HOURS Performed at [facility], [address], [place], [place] [address]    Report Status PENDING  Incomplete  Blood culture (routine x 2)     Status: None (Preliminary result)   Collection Time: 02/26/23  9:18 PM   Specimen: BLOOD LEFT ARM   Result Value Ref Range Status   Specimen Description BLOOD LEFT ARM BOTTLES DRAWN AEROBIC AND ANAEROBIC  Final   Special Requests Blood Culture adequate volume  Final   Culture   Final    NO GROWTH < 12 HOURS Performed at [facility], [address], [place], [place] [address]    Report Status PENDING  Incomplete    Imaging reviewed:  CT L-SPINE NO CHARGE Result Date: 02/26/2023 CLINICAL DATA:  Rectal cancer.  Back pain. EXAM: CT LUMBAR SPINE WITHOUT CONTRAST TECHNIQUE: Multidetector CT imaging of the lumbar spine  was performed without intravenous contrast administration. Multiplanar CT image reconstructions were also generated. RADIATION DOSE REDUCTION: This exam was performed according to the departmental dose-optimization program which includes automated exposure control, adjustment of the mA and/or kV according to patient size and/or use of iterative reconstruction technique. COMPARISON:  CT lumbar spine 10/14/2022 FINDINGS: Segmentation: 5 lumbar type vertebrae. Alignment: Normal. Vertebrae: Multilevel mild degenerative changes of the spine. Posterior disc osteophyte complex formation at the L5-S1 level. Scattered axial and appendicular mix lytic and sclerotic metastasis static lesions. No acute fracture. Paraspinal and other soft tissues: Negative. Disc levels: Maintained. IMPRESSION: 1. Scattered axial and appendicular mix lytic and sclerotic metastasis lesions. 2. No acute displaced fracture or traumatic listhesis of the lumbar spine. 3. Please see separately dictated CT chest, abdomen, pelvis 10/14/2022. Electronically Signed   By: Tish Frederickson M.D.   On: 02/26/2023 22:49   CT Angio Chest PE W and/or Wo Contrast Addendum Date: 02/26/2023 ADDENDUM REPORT: 02/26/2023 22:46 ADDENDUM: The proctitis and cystitis may be due to radiation changes; however, infection is not excluded. Electronically Signed   By: Tish Frederickson M.D.   On: 02/26/2023 22:46   Result Date: 02/26/2023 CLINICAL DATA:   Abdominal pain, acute, nonlocalized; Pulmonary embolism (PE) suspected, high prob sick call. Pt c/o dehydration, leg, back and neck pain. EXAM: CT ANGIOGRAPHY CHEST CT ABDOMEN AND PELVIS WITH CONTRAST TECHNIQUE: Multidetector CT imaging of the chest was performed using the standard protocol during bolus administration of intravenous contrast. Multiplanar CT image reconstructions and MIPs were obtained to evaluate the vascular anatomy. Multidetector CT imaging of the abdomen and pelvis was performed using the standard protocol during bolus administration of intravenous contrast. RADIATION DOSE REDUCTION: This exam was performed according to the departmental dose-optimization program which includes automated exposure control, adjustment of the mA and/or kV according to patient size and/or use of iterative reconstruction technique. CONTRAST:  OMNIPAQUE IOHEXOL 350 MG/ML SOLN COMPARISON:  CT chest, abdomen, pelvis 10/14/2022, PET CT 07/11/2022, CT chest abdomen pelvis 02/05/2022 FINDINGS: CTA CHEST FINDINGS Cardiovascular: Right chest wall Port-A-Cath with tip in the right atrium. Satisfactory opacification of the pulmonary arteries to the segmental level. No evidence of pulmonary embolism. Normal heart size. No significant pericardial effusion. The thoracic aorta is normal in caliber. No atherosclerotic plaque of the thoracic aorta. No coronary artery calcifications. Mediastinum/Nodes: Interval development of bilateral hilar and mediastinal lymphadenopathy. No enlarged axillary lymph nodes. Thyroid gland, trachea, and esophagus demonstrate no significant findings. Lungs/Pleura: Emphysematous changes. Question development of tree-in-bud nodularity is within the inferior peripheral aspect of the right upper lobe. Stable right lower lobe subpleural calcified nodule. No pulmonary mass. No pleural effusion. No pneumothorax. Musculoskeletal: No chest wall abnormality. Scattered axial and appendicular mix lytic and  sclerotic metastasis static lesions. Interval increase in size of multiple expansile lytic rib lesions with as an example a 4.9 x 3.5 cm (from 3.9 x 2.1 cm). Interval development of a T4 sclerotic vertebral body lesion. Partially visualized right humeral surgical hardware. No definite acute displaced a pathologic fracture. Review of the MIP images confirms the above findings. CT ABDOMEN and PELVIS FINDINGS Hepatobiliary: No focal liver abnormality. No gallstones, gallbladder wall thickening, or pericholecystic fluid. No biliary dilatation. Pancreas: Diffusely atrophic. No focal lesion. Otherwise normal pancreatic contour. No surrounding inflammatory changes. No main pancreatic ductal dilatation. Spleen: Normal in size without focal abnormality. Adrenals/Urinary Tract: No adrenal nodule bilaterally. Bilateral kidneys enhance symmetrically. Fluid density lesions likely represent simple renal cysts. Simple renal cysts, in the absence of  clinically indicated signs/symptoms, require no independent follow-up. No hydronephrosis. No hydroureter. The urinary bladder is distended with urine. Circumferential urinary bladder wall thickening. Stomach/Bowel: Stomach is within normal limits. No evidence of small bowel wall thickening or dilatation. Interval development of right tool wall thickening and mucosal hyperemia with perirectal fat stranding. Appendix appears normal. Vascular/Lymphatic: No abdominal aorta or iliac aneurysm. Mild atherosclerotic plaque of the aorta and its branches. No abdominal, pelvic, or inguinal lymphadenopathy. Reproductive: Prostate is unremarkable. Other: No intraperitoneal free fluid. No intraperitoneal free gas. No organized fluid collection. Musculoskeletal: No abdominal wall hernia or abnormality. Stable to slightly increased in size expansile pedunculated lesion arising from the left inferior pubic rami. Scattered axial and appendicular mix lytic and sclerotic metastasis static lesions. Total  left hip arthroplasty. No definite pathologic fracture. Please see separately dictated CT lumbar spine 02/26/2023. Review of the MIP images confirms the above findings. IMPRESSION: 1. No pulmonary embolus. 2. Question development of tree-in-bud nodularity is within the inferior peripheral aspect of the right upper lobe. Finding could represent developing infection. 3. Interval development of bilateral hilar and mediastinal lymphadenopathy. 4. Interval development of proctitis. Recommend colonoscopy status post treatment and status post complete resolution of inflammatory changes to exclude an underlying lesion. 5. Interval development of circumferential urinary bladder wall thickening. Correlate with urinalysis for infection. 6. Scattered axial and appendicular mix lytic and sclerotic metastasis static lesions. table to slightly increased in size expansile pedunculated lesion arising from the left inferior pubic rami. Interval increase in size of multiple expansile lytic rib lesions with as an example a 4.9 x 3.5 cm (from 3.9 x 2.1 cm). Interval development of a T4 sclerotic vertebral body lesion. 7. No definite pathologic fracture. Electronically Signed: By: Tish Frederickson M.D. On: 02/26/2023 22:42   CT ABDOMEN PELVIS W CONTRAST Addendum Date: 02/26/2023 ADDENDUM REPORT: 02/26/2023 22:46 ADDENDUM: The proctitis and cystitis may be due to radiation changes; however, infection is not excluded. Electronically Signed   By: Tish Frederickson M.D.   On: 02/26/2023 22:46   Result Date: 02/26/2023 CLINICAL DATA:  Abdominal pain, acute, nonlocalized; Pulmonary embolism (PE) suspected, high prob sick call. Pt c/o dehydration, leg, back and neck pain. EXAM: CT ANGIOGRAPHY CHEST CT ABDOMEN AND PELVIS WITH CONTRAST TECHNIQUE: Multidetector CT imaging of the chest was performed using the standard protocol during bolus administration of intravenous contrast. Multiplanar CT image reconstructions and MIPs were obtained to  evaluate the vascular anatomy. Multidetector CT imaging of the abdomen and pelvis was performed using the standard protocol during bolus administration of intravenous contrast. RADIATION DOSE REDUCTION: This exam was performed according to the departmental dose-optimization program which includes automated exposure control, adjustment of the mA and/or kV according to patient size and/or use of iterative reconstruction technique. CONTRAST:  OMNIPAQUE IOHEXOL 350 MG/ML SOLN COMPARISON:  CT chest, abdomen, pelvis 10/14/2022, PET CT 07/11/2022, CT chest abdomen pelvis 02/05/2022 FINDINGS: CTA CHEST FINDINGS Cardiovascular: Right chest wall Port-A-Cath with tip in the right atrium. Satisfactory opacification of the pulmonary arteries to the segmental level. No evidence of pulmonary embolism. Normal heart size. No significant pericardial effusion. The thoracic aorta is normal in caliber. No atherosclerotic plaque of the thoracic aorta. No coronary artery calcifications. Mediastinum/Nodes: Interval development of bilateral hilar and mediastinal lymphadenopathy. No enlarged axillary lymph nodes. Thyroid gland, trachea, and esophagus demonstrate no significant findings. Lungs/Pleura: Emphysematous changes. Question development of tree-in-bud nodularity is within the inferior peripheral aspect of the right upper lobe. Stable right lower lobe subpleural calcified nodule. No  pulmonary mass. No pleural effusion. No pneumothorax. Musculoskeletal: No chest wall abnormality. Scattered axial and appendicular mix lytic and sclerotic metastasis static lesions. Interval increase in size of multiple expansile lytic rib lesions with as an example a 4.9 x 3.5 cm (from 3.9 x 2.1 cm). Interval development of a T4 sclerotic vertebral body lesion. Partially visualized right humeral surgical hardware. No definite acute displaced a pathologic fracture. Review of the MIP images confirms the above findings. CT ABDOMEN and PELVIS FINDINGS  Hepatobiliary: No focal liver abnormality. No gallstones, gallbladder wall thickening, or pericholecystic fluid. No biliary dilatation. Pancreas: Diffusely atrophic. No focal lesion. Otherwise normal pancreatic contour. No surrounding inflammatory changes. No main pancreatic ductal dilatation. Spleen: Normal in size without focal abnormality. Adrenals/Urinary Tract: No adrenal nodule bilaterally. Bilateral kidneys enhance symmetrically. Fluid density lesions likely represent simple renal cysts. Simple renal cysts, in the absence of clinically indicated signs/symptoms, require no independent follow-up. No hydronephrosis. No hydroureter. The urinary bladder is distended with urine. Circumferential urinary bladder wall thickening. Stomach/Bowel: Stomach is within normal limits. No evidence of small bowel wall thickening or dilatation. Interval development of right tool wall thickening and mucosal hyperemia with perirectal fat stranding. Appendix appears normal. Vascular/Lymphatic: No abdominal aorta or iliac aneurysm. Mild atherosclerotic plaque of the aorta and its branches. No abdominal, pelvic, or inguinal lymphadenopathy. Reproductive: Prostate is unremarkable. Other: No intraperitoneal free fluid. No intraperitoneal free gas. No organized fluid collection. Musculoskeletal: No abdominal wall hernia or abnormality. Stable to slightly increased in size expansile pedunculated lesion arising from the left inferior pubic rami. Scattered axial and appendicular mix lytic and sclerotic metastasis static lesions. Total left hip arthroplasty. No definite pathologic fracture. Please see separately dictated CT lumbar spine 02/26/2023. Review of the MIP images confirms the above findings. IMPRESSION: 1. No pulmonary embolus. 2. Question development of tree-in-bud nodularity is within the inferior peripheral aspect of the right upper lobe. Finding could represent developing infection. 3. Interval development of bilateral hilar  and mediastinal lymphadenopathy. 4. Interval development of proctitis. Recommend colonoscopy status post treatment and status post complete resolution of inflammatory changes to exclude an underlying lesion. 5. Interval development of circumferential urinary bladder wall thickening. Correlate with urinalysis for infection. 6. Scattered axial and appendicular mix lytic and sclerotic metastasis static lesions. table to slightly increased in size expansile pedunculated lesion arising from the left inferior pubic rami. Interval increase in size of multiple expansile lytic rib lesions with as an example a 4.9 x 3.5 cm (from 3.9 x 2.1 cm). Interval development of a T4 sclerotic vertebral body lesion. 7. No definite pathologic fracture. Electronically Signed: By: Tish Frederickson M.D. On: 02/26/2023 22:42     ED Course:  Imaging obtained per above.  Treated with fentanyl, 1 L IV fluid, ceftriaxone, azithromycin   Assessment/Plan:  63 y.o. male with hx metastatic rectal cancer, diffuse bony mets, on chemotherapy with FOLFIRI/panitumumab, recently completed palliative radiation treatment, diabetes, hypertension, hyperlipidemia, who was brought in by EMS from home due to left leg pain and worsening weakness.   Left lower extremity pain, possible lumbosacral radiculopathy versus cancer related pain Asymmetric left lower extremity edema Generalized weakness, deconditioning History multiple weeks of pain which sounds radicular in nature radiating down his posterior thigh and swelling which has been an ongoing issue but now worse in the left leg.  On exam 4-5 strength which is symmetric and sensation is intact L1-S1. CT of the L-spine demonstrating metastatic lesions, mild degenerative changes, no pathologic fracture.  Will need evaluation to  rule out underlying DVT.  If negative may have lymphatic disruption related to his underlying history of cancer.  Underlying pain may be radicular in nature may consider MRI if  persistent. - Consider MRI L-spine if left lower extremity pain is persistent to evaluate for radiculopathy. - Venous duplex left lower extremity to rule out DVT - PT/OT evaluation for deconditioning -Symptomatic management per cancer related pain below  Metastatic rectal cancer, diffuse bony mets Followed by Dr. Leonides Schanz in oncology, currently on chemotherapy with FOLFIRI/panitumumab (although missed recent treatments due to pain), recently completed palliative radiation treatment to right humerus, L-spine, left pelvis.  Imaging obtained this admission including a CTA chest and CT abdomen pelvis demonstrating new hilar and mediastinal lymphadenopathy, increased size of bony mets, proctitis. Has uncontrolled cancer related pain although not taking his full prescribed doses of opiate medications due to somnolence and having hallucinations.  Home regimen is oxycodone ER 36 mg every 12 hours, and oxycodone IR 10 mg every 6 hours as needed - Continue home long-acting at next closest dose available inpatient, oxycodone ER 40 mg every 12 hours, add Dilaudid 0.5 mg IV q 4 hr prn for breakthrough  - Offer lower dose of his short acting oxycodone 5/10 mg for moderate/severe pain every 6 hours as needed - May be beneficial to review his case with palliative care given his adverse reaction and potential alternative regimens, ?  Fentanyl patch is a possibility  AKI stage I Acute urinary retention Baseline creatinine around 0.5, elevated to 0.8 on admission.  Possibly prerenal with decreased intake versus post obstructive.  Had urinary retention in the ED required in and out cath.  Bladder wall thickening may be reflective of chronic retention, UA not consistent with UTI. - In-N-Out cath x 1, serial bladder scans ordered - Start on tamsulosin for urinary retention - Status post 1 L IV fluid in the ED continue oral hydration  Anemia acute on chronic Baseline hemoglobin around 8-9, was 7.9 at admission but  dropped to 6.9 which was confirmed on our repeat check.  Denies any bleeding sites.  Evaluation with high ferritin 2921, low transferrin, sat 21%, normal folate and B12.  Consistent with anemia of chronic disease likely related to his underlying malignancy. -Considering drop in hemoglobin we will transfuse 1 unit RBC, type and screened and consented for blood. -CBC daily unless evidence of bleeding  Incidental findings on imaging: Hilar and mediastinal lymphadenopathy: See metastatic cancer above Proctitis: Suspect related to his underlying malignancy, hold on antibiotics. Tree-in-bud opacity inferior right upper lobe: At this point he has no symptoms of pneumonia, on room air, WBC 11 this may reflect incidental finding of aspiration.  Will obtain SLP evaluation.  Status post ceftriaxone and azithromycin in the ED.  Stop antibiotics is not consistent with a pneumonia.  Chronic medical problems: Sacral ulcer: Wound care consult Diabetes: diet controlled not on any medication Hypertension: Not on antihypertensives at present Hyperlipidemia: No longer on cholesterol-lowering medication  Body mass index is 24.6 kg/m.    DVT prophylaxis:  Lovenox Code Status:  Full Code Diet:  Diet Orders (From admission, onward)     Start     Ordered   02/27/23 0214  Diet regular Room service appropriate? Yes; Fluid consistency: Thin  Diet effective now       Question Answer Comment  Room service appropriate? Yes   Fluid consistency: Thin      02/27/23 0352           Family Communication:  No   Consults:  none   Admission status:   Observation,  Med surg   Severity of Illness: The appropriate patient status for this patient is OBSERVATION. Observation status is judged to be reasonable and necessary in order to provide the required intensity of service to ensure the patient's safety. The patient's presenting symptoms, physical exam findings, and initial radiographic and laboratory data in the  context of their medical condition is felt to place them at decreased risk for further clinical deterioration. Furthermore, it is anticipated that the patient will be medically stable for discharge from the hospital within 2 midnights of admission.    Dolly Rias, MD [facility]  How to contact the [facility] Attending or Consulting provider 7A - 7P or covering provider during after hours 7P -7A, for this patient.  Check the care team in [facility] and look for a) attending/consulting [facility] provider listed and b) the [facility] team listed Log into [email or web address] and use [facility]'s universal password to access. If you do not have the password, please contact the hospital operator. Locate the [facility] [facility] provider you are looking for under [facility] and page to a number that you can be directly reached. If you still have difficulty reaching the provider, please page the [facility] (Director on Call) for the Hospitalists listed on amion for assistance.  02/27/2023, 7:09 AM

## 2023-02-27 NOTE — Evaluation (Signed)
Occupational Therapy Evaluation Patient Details Name: Joseph Hogan MRN: [id number] DOB: 1960/10/24 Today's Date: 02/27/2023   History of Present Illness Joseph Hogan is a 63 y.o. male with hx of metastatic rectal cancer, diffuse bony mets, on chemotherapy with FOLFIRI/panitumumab, recently completed palliative radiation treatment, diabetes, hypertension, hyperlipidemia, who was brought in by EMS from home due to left leg pain and worsening weakness.  He reports ongoing cancer related pain which is not controlled with his home medications.  He has been taking his home long-acting oxycodone ER however not using his IR medications due to concern this is resulting in side effects including more somnolence and intermittent hallucinations.  He has been having worsening left leg pain ongoing for the past few weeks.  Reports the pain radiates down posterior thigh on the left.  There is swelling in both lower extremities which waxes and wanes.  Currently asymmetric on the left.  No history of VTE in the past.  Typically ambulates with the use of the walker but has not been able to get up as much due to pain.  Denies any numbness or tingling.  Does feel weak in both legs.  Otherwise denies any recent cough/cold, shortness of breath, chest pain.  Does report sometimes having issues with swallowing but takes in a regular diet. (per MD)   Clinical Impression   Pt agreeable to OT and PT co-evaluation. Pt required mod A for bed mobility and min to mod A for sit to stand with RW. Pt is very weak with slow labored movement. Limited B UE A/ROM and strength. Max A needed for lower body dressing at this time. Pt reports having PRN family support at home. Pt left in the bed with call bell within reach. Pt will benefit from continued OT in the hospital and recommended venue below to increase strength, balance, and endurance for safe ADL's.          If plan is discharge home, recommend the following: A lot of help with  walking and/or transfers;A lot of help with bathing/dressing/bathroom;Assistance with cooking/housework;Assist for transportation;Help with stairs or ramp for entrance    Functional Status Assessment  Patient has had a recent decline in their functional status and demonstrates the ability to make significant improvements in function in a reasonable and predictable amount of time.  Equipment Recommendations  None recommended by OT           Precautions / Restrictions Precautions Precautions: Fall Restrictions Weight Bearing Restrictions Per Provider Order: No      Mobility Bed Mobility Overal bed mobility: Needs Assistance Bed Mobility: Supine to Sit, Sit to Supine     Supine to sit: Mod assist, HOB elevated Sit to supine: Mod assist   General bed mobility comments: slow labored movement; assist to manage B LE    Transfers Overall transfer level: Needs assistance Equipment used: Rolling walker (2 wheels) Transfers: Sit to/from Stand Sit to Stand: Min assist, Mod assist           General transfer comment: Labored effort with use of RW      Balance Overall balance assessment: Needs assistance Sitting-balance support: No upper extremity supported, Feet supported Sitting balance-Leahy Scale: Fair Sitting balance - Comments: fair to good seated at EOB   Standing balance support: During functional activity, Bilateral upper extremity supported, Reliant on assistive device for balance Standing balance-Leahy Scale: Poor Standing balance comment: using RW  ADL either performed or assessed with clinical judgement   ADL Overall ADL's : Needs assistance/impaired     Grooming: Set up;Sitting   Upper Body Bathing: Set up;Sitting   Lower Body Bathing: Maximal assistance;Sitting/lateral leans   Upper Body Dressing : Set up;Sitting   Lower Body Dressing: Maximal assistance;Sitting/lateral leans Lower Body Dressing Details (indicate  cue type and reason): Requried assist to don socks seated at EOB today. Toilet Transfer: Moderate assistance;Stand-pivot;Rolling walker (2 wheels) Toilet Transfer Details (indicate cue type and reason): Partially simualted with RW and min to mod A for sit to stand only. Toileting- Clothing Manipulation and Hygiene: Maximal assistance;Sitting/lateral lean;Bed level               Vision Baseline Vision/History: 1 Wears glasses Ability to See in Adequate Light: 1 Impaired Patient Visual Report: No change from baseline Vision Assessment?: Wears glasses for reading;No apparent visual deficits     Perception Perception: Not tested       Praxis Praxis: Not tested       Pertinent Vitals/Pain Pain Assessment Pain Assessment: No/denies pain     Extremity/Trunk Assessment Upper Extremity Assessment Upper Extremity Assessment: Generalized weakness (3-/5 bilateral shoulder flexion with limited P/ROM to less than 75% available range as well. Generally weak otherwise.)   Lower Extremity Assessment Lower Extremity Assessment: Defer to PT evaluation   Cervical / Trunk Assessment Cervical / Trunk Assessment: Normal   Communication Communication Communication: No apparent difficulties   Cognition Arousal: Alert Behavior During Therapy: WFL for tasks assessed/performed Overall Cognitive Status: Within Functional Limits for tasks assessed                                                        Home Living Family/patient expects to be discharged to:: Private residence Living Arrangements: Alone Available Help at Discharge: Family;Available PRN/intermittently Type of Home: House Home Access: Stairs to enter [employer] of Steps: 1 Entrance Stairs-Rails: Right Home Layout: One level     Bathroom Shower/Tub: Chief Strategy Officer: Standard Bathroom Accessibility: Yes How Accessible: Accessible via wheelchair;Accessible via  walker Home Equipment: Rolling Walker (2 wheels);Rollator (4 wheels);Shower seat;Transport chair          Prior Functioning/Environment Prior Level of Function : Needs assist       Physical Assist : ADLs (physical)   ADLs (physical): IADLs Mobility Comments: PRN use of RW for ambualtion ADLs Comments: Independent ADL's; assist groceries; still tries to compelte other IADL's.        OT Problem List: Decreased strength;Decreased range of motion;Decreased activity tolerance;Impaired balance (sitting and/or standing)      OT Treatment/Interventions: Self-care/ADL training;Therapeutic exercise;Therapeutic activities;Patient/family education    OT Goals(Current goals can be found in the care plan section) Acute Rehab OT Goals Patient Stated Goal: return home OT Goal Formulation: With patient Time For Goal Achievement: 03/13/23 Potential to Achieve Goals: Good  OT Frequency: Min 2X/week    Co-evaluation PT/OT/SLP Co-Evaluation/Treatment: Yes Reason for Co-Treatment: To address functional/ADL transfers   OT goals addressed during session: ADL's and self-care      AM-PAC OT "6 Clicks" Daily Activity     Outcome Measure Help from another person eating meals?: None Help from another person taking care of personal grooming?: A Little Help from another person toileting, which includes using toliet, bedpan, or  urinal?: A Lot Help from another person bathing (including washing, rinsing, drying)?: A Lot Help from another person to put on and taking off regular upper body clothing?: A Little Help from another person to put on and taking off regular lower body clothing?: A Lot 6 Click Score: 16   End of Session Equipment Utilized During Treatment: Rolling walker (2 wheels)  Activity Tolerance: Patient tolerated treatment well Patient left: in bed;with call bell/phone within reach  OT Visit Diagnosis: Unsteadiness on feet (R26.81);Other abnormalities of gait and mobility  (R26.89);Muscle weakness (generalized) (M62.81)                Time: 9562-1308 OT Time Calculation (min): 21 min Charges:  OT General Charges $OT Visit: 1 Visit OT Evaluation $OT Eval Low Complexity: 1 Low  Raevin Wierenga OT, MOT  Danie Chandler 02/27/2023, 9:32 AM

## 2023-02-27 NOTE — Plan of Care (Signed)
  Problem: Acute Rehab PT Goals(only PT should resolve) Goal: Pt Will Go Supine/Side To Sit Flowsheets (Taken 02/27/2023 1221) Pt will go Supine/Side to Sit:  with moderate assist  with minimal assist Goal: Patient Will Transfer Sit To/From Stand Flowsheets (Taken 02/27/2023 1221) Patient will transfer sit to/from stand:  with contact guard assist  with minimal assist Goal: Pt Will Transfer Bed To Chair/Chair To Bed Flowsheets (Taken 02/27/2023 1221) Pt will Transfer Bed to Chair/Chair to Bed: with min assist Goal: Pt Will Ambulate Flowsheets (Taken 02/27/2023 1221) Pt will Ambulate:  15 feet  with moderate assist  with rolling walker   12:23 PM, 02/27/23 Joseph Hogan, MPT Physical Therapist with [facility] [phone] office [phone] mobile phone

## 2023-02-27 NOTE — Progress Notes (Signed)
Patient seen and examined; admitted after midnight secondary to leg pain and back pain; patient reports feeling weak in both legs and noticing difficulty ambulating.  He has a history of metastatic rectal cancer with diffuse bone metastasis at baseline and is actively receiving chemo and radiation.  So far workup demonstrating negative lower extremity DVT with concern for new bone metastasis to his legs (especially left leg fibula/tibia).  He reports having some difficulty with his pain medication regimen at home (not working as desired).  Please refer to H&P written by Dr. Lazarus Salines on 02/26/2022 for further info/details at time of admission.  Plan: -Palliative care consultation for goals of care, advance care planning and pain management assistance has been requested. -Will check x-rays of his left leg for confirmation of bone metastasis. -Continue as needed analgesics -Follow clinical response.  Vassie Loll MD [phone]

## 2023-02-27 NOTE — Evaluation (Signed)
Physical Therapy Evaluation Patient Details Name: Joseph Hogan MRN: [id number] DOB: [birth year] Today's Date: 02/27/2023  History of Present Illness  Joseph Hogan is a 63 y.o. male with hx of metastatic rectal cancer, diffuse bony mets, on chemotherapy with FOLFIRI/panitumumab, recently completed palliative radiation treatment, diabetes, hypertension, hyperlipidemia, who was brought in by EMS from home due to left leg pain and worsening weakness.  He reports ongoing cancer related pain which is not controlled with his home medications.  He has been taking his home long-acting oxycodone ER however not using his IR medications due to concern this is resulting in side effects including more somnolence and intermittent hallucinations.  He has been having worsening left leg pain ongoing for the past few weeks.  Reports the pain radiates down posterior thigh on the left.  There is swelling in both lower extremities which waxes and wanes.  Currently asymmetric on the left.  No history of VTE in the past.  Typically ambulates with the use of the walker but has not been able to get up as much due to pain.  Denies any numbness or tingling.  Does feel weak in both legs.  Otherwise denies any recent cough/cold, shortness of breath, chest pain.  Does report sometimes having issues with swallowing but takes in a regular diet. (per MD)   Clinical Impression  Patient demonstrates slow labored movement for sitting up at bedside with most difficulty moving LLE due to pain and swelling, able to stand using RW and limited to a few slow labored unsteady side steps before having to sit due to knees buckling, BLE weakness.  Patient put back to bed after therapy due to receiving blood transfusion.  Patient will benefit from continued skilled physical therapy in hospital and recommended venue below to increase strength, balance, endurance for safe ADLs and gait.           If plan is discharge home, recommend the following:  A lot of help with walking and/or transfers;A lot of help with bathing/dressing/bathroom;Assistance with cooking/housework;Help with stairs or ramp for entrance   Can travel by private vehicle   No    Equipment Recommendations None recommended by PT  Recommendations for Other Services       Functional Status Assessment Patient has had a recent decline in their functional status and demonstrates the ability to make significant improvements in function in a reasonable and predictable amount of time.     Precautions / Restrictions Precautions Precautions: Fall Restrictions Weight Bearing Restrictions Per Provider Order: No      Mobility  Bed Mobility Overal bed mobility: Needs Assistance Bed Mobility: Supine to Sit, Sit to Supine     Supine to sit: Mod assist, HOB elevated Sit to supine: Mod assist   General bed mobility comments: poor tolerance for moving BLE due to weakness and increased pain mostly in LLE    Transfers Overall transfer level: Needs assistance Equipment used: Rolling walker (2 wheels) Transfers: Sit to/from Stand Sit to Stand: Min assist, Mod assist           General transfer comment: slow labored movement with poor standing balance using RW    Ambulation/Gait Ambulation/Gait assistance: Mod assist, Max assist Gait Distance (Feet): 3 Feet Assistive device: Rolling walker (2 wheels) Gait Pattern/deviations: Decreased step length - right, Decreased step length - left, Decreased stance time - left, Knees buckling, Antalgic, Narrow base of support Gait velocity: slow     General Gait Details: limited to a few slow  labored side steps due to weakness, buckling of knees, LLE pain  Stairs            Wheelchair Mobility     Tilt Bed    Modified Rankin (Stroke Patients Only)       Balance Overall balance assessment: Needs assistance Sitting-balance support: No upper extremity supported, Feet supported Sitting balance-Leahy Scale:  Fair Sitting balance - Comments: fair to good seated at EOB   Standing balance support: During functional activity, Bilateral upper extremity supported, Reliant on assistive device for balance Standing balance-Leahy Scale: Poor Standing balance comment: using RW                             Pertinent Vitals/Pain Pain Assessment Pain Assessment: Faces Faces Pain Scale: Hurts little more Pain Location: LLE with pressure/movement    Home Living Family/patient expects to be discharged to:: Private residence Living Arrangements: Alone Available Help at Discharge: Family;Available PRN/intermittently Type of Home: House Home Access: Stairs to enter Entrance Stairs-Rails: Right Entrance Stairs-Number of Steps: 1   Home Layout: One level Home Equipment: Agricultural consultant (2 wheels);Rollator (4 wheels);Shower seat;Transport chair      Prior Function Prior Level of Function : Needs assist       Physical Assist : ADLs (physical)   ADLs (physical): IADLs Mobility Comments: PRN use of RW for ambualtion ADLs Comments: Independent ADL's; assist groceries; still tries to compelte other IADL's.     Extremity/Trunk Assessment   Upper Extremity Assessment Upper Extremity Assessment: Defer to OT evaluation    Lower Extremity Assessment Lower Extremity Assessment: Generalized weakness    Cervical / Trunk Assessment Cervical / Trunk Assessment: Normal  Communication   Communication Communication: No apparent difficulties  Cognition Arousal: Alert Behavior During Therapy: WFL for tasks assessed/performed Overall Cognitive Status: Within Functional Limits for tasks assessed                                          General Comments      Exercises     Assessment/Plan    PT Assessment Patient needs continued PT services  PT Problem List Decreased strength;Decreased activity tolerance;Decreased balance;Decreased mobility       PT Treatment  Interventions DME instruction;Gait training;Stair training;Functional mobility training;Therapeutic activities;Therapeutic exercise;Balance training;Patient/family education    PT Goals (Current goals can be found in the Care Plan section)  Acute Rehab PT Goals Patient Stated Goal: return home with family to assist PT Goal Formulation: With patient Time For Goal Achievement: 03/13/23 Potential to Achieve Goals: Good    Frequency Min 3X/week     Co-evaluation PT/OT/SLP Co-Evaluation/Treatment: Yes Reason for Co-Treatment: To address functional/ADL transfers;Complexity of the patient's impairments (multi-system involvement) PT goals addressed during session: Mobility/safety with mobility;Balance;Proper use of DME OT goals addressed during session: ADL's and self-care       AM-PAC PT "6 Clicks" Mobility  Outcome Measure Help needed turning from your back to your side while in a flat bed without using bedrails?: A Lot Help needed moving from lying on your back to sitting on the side of a flat bed without using bedrails?: A Lot Help needed moving to and from a bed to a chair (including a wheelchair)?: A Lot Help needed standing up from a chair using your arms (e.g., wheelchair or bedside chair)?: A Lot Help needed to walk in  hospital room?: A Lot Help needed climbing 3-5 steps with a railing? : Total 6 Click Score: 11    End of Session   Activity Tolerance: Patient tolerated treatment well;Patient limited by fatigue;Patient limited by pain Patient left: in bed;with call bell/phone within reach Nurse Communication: Mobility status PT Visit Diagnosis: Unsteadiness on feet (R26.81);Other abnormalities of gait and mobility (R26.89);Muscle weakness (generalized) (M62.81)    Time: 1610-9604 PT Time Calculation (min) (ACUTE ONLY): 18 min   Charges:   PT Evaluation $PT Eval Low Complexity: 1 Low PT Treatments $Therapeutic Activity: 8-22 mins PT General Charges $$ ACUTE PT VISIT: 1  Visit         12:19 PM, 02/27/23 Ocie Bob, MPT Physical Therapist with [facility] [phone] office [phone] mobile phone

## 2023-02-27 NOTE — Consult Note (Signed)
Consultation Note Date: 02/27/2023   Patient Name: Joseph Hogan  DOB: 1960/12/09  MRN: [id number]  Age / Sex: 63 y.o., male  PCP: Joseph Standard, MD Referring Physician: Vassie Loll, MD  Reason for Consultation: Establishing goals of care  HPI/Patient Profile: 63 y.o. 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    Clinical Assessment and Goals of Care: I have reviewed medical records including EPIC notes, labs and imaging, received report from RN, assessed the patient.  Joseph Hogan is lying quietly in the ED on the stretcher.  He appears acutely/chronically ill and quite frail.  He greets me, making and somewhat keeping eye contact.  He is alert and oriented, able to make his needs known.  His daughter, Joseph Hogan, is present at bedside.  We meet at the bedside to discuss diagnosis prognosis, GOC, EOL wishes, disposition and options.  I introduced Palliative Medicine as specialized medical care for people living with serious illness. It focuses on providing relief from the symptoms and stress of a serious illness. The goal is to improve quality of life for both the patient and the family.  We then focused on their current illness.  Multiple times during our visit, Joseph Hogan endorses pain.  We talk about pain management.  It seems that his last radiation treatment for bony lesions was April 2024.  We talked about the use of radiation treatment for bone pain management. Patient and family would like for him to restart long acting XTAMPZA ER 36 MG, BID.  Conference with attending and pharmacist related to using at home medication. Joseph Hogan tells me that he has been having hallucinations.  He tells me that he had a "full conversation" with his  father, who is deceased, a few moments ago.  He shares that he is not fearful.   We talk about hemoglobin 6.8 and the plan for transfusion.   We talk about proctitis, and Joseph Hogan endorses difficulty with urination and pain.     We talked about recent CT results.  Although Joseph Hogan shares that he was told the results of CT, he is unable to share with me or his daughter.  We talk about the results with permission.  I asked Joseph Hogan if he were to be surprised if his CT showed progression of cancer.  He shares that he would not be surprised.  We talk about interval increase of multiple expansile lytic rib lesions and development of a T4 sclerotic vertebral body lesion, even with current treatment.   We talk about his cancer treatments, how he is tolerating.  Daughter Joseph Hogan shares that he has lost weight and become much more frail over the course of chemotherapy.  Initially, Joseph Hogan tells me that he would continue chemotherapy at the offered.  As we continue our conversation he shares that, "you have given me a lot to think about".  He is able to state that he understands his cancer is not curable.  He tells me that he has not been told how much time is anticipated from taking chemotherapy.   Several times during our conversation Joseph Hogan speaks about his faith.  Reassurance provided that he will always be cared for.   I attempted to elicit values and goals of care important to the patient.  The difference between aggressive medical intervention and comfort care was considered in light of the patient's goals of care.  We talk about what is important to Joseph Hogan, what he does and does not want.    Advanced directives, concepts specific to code status, artifical feeding and hydration, and rehospitalization were considered and discussed.  We talked about the concept of "treat the treatable, but allow a natural passing".  Joseph Hogan states that, "you have given me a lot to think about".  Daughter  Joseph Hogan is nodding affirmatively when we discussed DNR.  Palliative Care services outpatient were explained and offered.  Joseph Hogan is active with cancer center palliative team.  He would not be eligible for "treat the treatable" hospice care until he is no longer seeking chemotherapy.  Discussed the importance of continued conversation with family and the medical providers regarding overall plan of care and treatment options, ensuring decisions are within the context of the patient's values and GOCs.  Questions and concerns were addressed.  The patient was encouraged to call with questions or concerns.  PMT will continue to support holistically.  Conference with attending, bedside nursing staff, transition of care team, pharmacy related to patient condition, needs, goals of care, disposition.   HCPOA  NEXT OF KIN -patient and daughter Joseph Hogan share that Joseph Hogan's [name] and daughter Joseph Hogan share healthcare power of attorney.    SUMMARY OF RECOMMENDATIONS   At this point continue to treat the treatable Time for outcomes Currently states that he would continue chemotherapy as offered Considering home versus rehab    Code Status/Advance Care Planning: Full code - We talked about the concept of "treat the treatable, but allow a natural passing".  Joseph Hogan states that, "you have given me a lot to think about".  Daughter Joseph Hogan is nodding affirmatively when we discussed DNR.  Symptom Management:  Per hospitalist Patient and family are requesting to restart XTAMPZA ER 36 MG, BID home med.  Conference with pharmacist Joseph Hogan and attending related to hospital management of this home med.  Palliative Prophylaxis:  Frequent Pain Assessment, Oral Care, and Palliative Wound Care  Additional Recommendations (Limitations, Scope, Preferences): Full Scope Treatment  Psycho-social/Spiritual:  Desire for further Chaplaincy support:no Additional Recommendations: Caregiving   Support/Resources and Education on Hospice  Prognosis:  Unable to determine, 3 to 6 months or less would not be surprising based on decreasing functional status, frailty, chronic illness burden, advancing cancer burden.  Discharge Planning: To be determined, anticipate home with home health.  Family is working on assisting with at home care.        Primary Diagnoses: Present on Admission:  Metastatic cancer (HCC)   I have reviewed the medical record, interviewed the patient and family, and examined the patient. The following aspects are pertinent.  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   with abnormal R wave progression noted on EKG   MRSA (methicillin resistant Staphylococcus aureus)    Pre-diabetes  Rectal carcinoma (HCC) 03/19/2022   Social History   Socioeconomic History   Marital status: Divorced    Spouse name: Not on file   Number of children: Not on file   Years of education: Not on file   Highest education level: Not on file  Occupational History   Not on file  Tobacco Use   Smoking status: Never   Smokeless tobacco: Never  Vaping Use   Vaping status: Never Used  Substance and Sexual Activity   Alcohol use: No   Drug use: No   Sexual activity: Not Currently  Other Topics Concern   Not on file  Social History Narrative   Not on file   Social Drivers of Health   Financial Resource Strain: Not on file  Food Insecurity: No Food Insecurity (10/15/2022)   Hunger Vital Sign    Worried About Running Out of Food in the Last Year: Never true    Ran Out of Food in the Last Year: Never true  Transportation Needs: No Transportation Needs (10/15/2022)   PRAPARE - Administrator, Civil Service (Medical): No    Lack of Transportation (Non-Medical): No  Physical Activity: Not on file  Stress: Not on file  Social Connections: Not on file   Family History   Problem Relation Age of Onset   Stroke Mother    Hypertension Mother    Heart disease Father    Alzheimer's disease Father    Hypertension Brother    Alcohol abuse Brother    Cancer Maternal Aunt    Heart disease Maternal Aunt    Cancer Maternal Uncle    Heart disease Maternal Uncle    Cancer Paternal Aunt    Heart disease Paternal Aunt    Cancer Paternal Uncle    Heart disease Paternal Uncle    Heart disease Paternal Grandmother    Alzheimer's disease Paternal Grandfather    Stomach cancer Neg Hx    Rectal cancer Neg Hx    Esophageal cancer Neg Hx    Colon cancer Neg Hx    Scheduled Meds:  cyanocobalamin  1,000 mcg Oral Daily   enoxaparin (LOVENOX) injection  40 mg Subcutaneous Q24H   oxyCODONE  40 mg Oral Q12H   senna-docusate  1 tablet Oral BID   sodium chloride flush  3 mL Intravenous Q12H   tamsulosin  0.4 mg Oral QPC supper   Continuous Infusions:  azithromycin Stopped (02/27/23 0134)   PRN Meds:.acetaminophen, HYDROmorphone (DILAUDID) injection, oxyCODONE **OR** oxyCODONE Medications Prior to Admission:  Prior to Admission medications   Medication Sig Start Date End Date Taking? Authorizing Provider  cyanocobalamin (VITAMIN B12) 1000 MCG tablet Take 1 tablet (1,000 mcg total) by mouth daily. 10/20/22  Yes Kathlen Mody, MD  oxyCODONE ER [facility] ER) 36 MG C12A Take 1 capsule (36 mg total) by mouth every 12 (twelve) hours. 01/30/23  Yes Pickenpack-Cousar, Arty Baumgartner, NP  Oxycodone HCl 10 MG TABS Take 1 tablet (10 mg total) by mouth every 6 (six) hours as needed. Patient taking differently: Take 10 mg by mouth every 6 (six) hours as needed (pain). 01/30/23  Yes Pickenpack-Cousar, Arty Baumgartner, NP  potassium chloride (KLOR-CON M) 10 MEQ tablet Take 1 tablet (10 mEq total) by mouth daily. 11/21/22  Yes Pollyann Samples, NP  senna-docusate (SENOKOT-S) 8.6-50 MG tablet Take 1 tablet by mouth 2 (two) times daily. 10/20/22  Yes Kathlen Mody, MD  silver sulfADIAZINE (SILVADENE) 1 %  cream Apply 1 Application topically daily. 01/03/23  Yes Joseph Standard, MD   Allergies  Allergen Reactions   Codeine Nausea And Vomiting   Dilaudid [Hydromorphone Hcl] Nausea And Vomiting    Reaction only present for IM administrations. IV and PO admins are effective.    Lisinopril Cough   Review of Systems  Unable to perform ROS: Other    Physical Exam Vitals and nursing note reviewed.  Constitutional:      Appearance: He is ill-appearing.  HENT:     Mouth/Throat:     Mouth: Mucous membranes are dry.  Cardiovascular:     Rate and Rhythm: Normal rate.  Pulmonary:     Effort: Pulmonary effort is normal. No respiratory distress.  Skin:    General: Skin is warm and dry.     Coloration: Skin is pale.  Neurological:     Mental Status: He is alert and oriented to person, place, and time.  Psychiatric:        Mood and Affect: Mood normal.        Behavior: Behavior normal.    Vital Signs: BP 104/65   Pulse 79   Temp 98 F (36.7 C) (Oral)   Resp 16   Ht 5\' 11"  (1.803 m)   Wt 80 kg   SpO2 100%   BMI 24.60 kg/m  Pain Scale: 0-10   Pain Score: Asleep   SpO2: SpO2: 100 % O2 Device:SpO2: 100 % O2 Flow Rate: .   IO: Intake/output summary:  Intake/Output Summary (Last 24 hours) at 02/27/2023 0929 Last data filed at 02/27/2023 0552 Gross per 24 hour  Intake 1850 ml  Output 400 ml  Net 1450 ml    LBM:   Baseline Weight: Weight: 80 kg Most recent weight: Weight: 80 kg     Palliative Assessment/Data:     Time In: 0820 Time Out: 0935 Time Total: 75 minutes  Greater than 50%  of this time was spent counseling and coordinating care related to the above assessment and plan.  Signed by: Katheran Awe, NP   Please contact Palliative Medicine Team phone at [phone] for questions and concerns.  For individual provider: See Loretha Stapler

## 2023-02-27 NOTE — ED Notes (Signed)
Patient unable to void since condom catheter placement at 0230. Patient unable to state whether he feels like he needs to urinate or not; patient tender to pressure in lower abd/bladder area. Bladder scan performed with result of >445mL. Admitting MD made aware

## 2023-02-27 NOTE — Progress Notes (Signed)
SLP Cancellation Note  Patient Details Name: JOSHUALEE ESTRELA MRN: [id number] DOB: [birth year]   Cancelled treatment:       Reason Eval/Treat Not Completed: Other (comment) (Pt off the floor from ED. SLP will check back for BSE as schedule permits.)  Thank you,  Havery Moros, CCC-SLP [phone]  Ignace Mandigo 02/27/2023, 3:22 PM

## 2023-02-27 NOTE — Plan of Care (Signed)

## 2023-02-27 NOTE — Plan of Care (Signed)
  Problem: Acute Rehab OT Goals (only OT should resolve) Goal: Pt. Will Perform Grooming Flowsheets (Taken 02/27/2023 0934) Pt Will Perform Grooming:  standing  with contact guard assist Goal: Pt. Will Perform Lower Body Bathing Flowsheets (Taken 02/27/2023 0934) Pt Will Perform Lower Body Bathing:  with supervision  with contact guard assist  sitting/lateral leans Goal: Pt. Will Perform Lower Body Dressing Flowsheets (Taken 02/27/2023 0934) Pt Will Perform Lower Body Dressing:  with supervision  with contact guard assist  sitting/lateral leans Goal: Pt. Will Transfer To Toilet Flowsheets (Taken 02/27/2023 0934) Pt Will Transfer to Toilet:  with modified independence  stand pivot transfer Goal: Pt. Will Perform Toileting-Clothing Manipulation Flowsheets (Taken 02/27/2023 0934) Pt Will Perform Toileting - Clothing Manipulation and hygiene:  with contact guard assist  sitting/lateral leans Goal: Pt/Caregiver Will Perform Home Exercise Program Flowsheets (Taken 02/27/2023 [phone]) Pt/caregiver will Perform Home Exercise Program:  Increased ROM  Increased strength  Both right and left upper extremity  Independently  Sylvio Weatherall OT, MOT

## 2023-02-27 NOTE — ED Notes (Addendum)
Patient cleaned, brief changed and wound care done at this time. Patient's sacral wounds cleaned with vashe wash, wet gauze and meriplex applied at this time.

## 2023-02-27 NOTE — Progress Notes (Signed)
SLP Cancellation Note  Patient Details Name: JUNUS SAWDY MRN: [id number] DOB: November 25, 1960   Cancelled treatment:       Reason Eval/Treat Not Completed: Other (comment) (RN is completing admission work with Pt, unable to complete BSE at this time. Pt denies difficulty swallowing, but does report xerostomia. SLP provided ice chips to Pt/nurse. Will re-attempt tomorrow. Pt is on a diet.)  Thank you,  Havery Moros, CCC-SLP [phone]  Marlaysia Lenig 02/27/2023, 4:06 PM

## 2023-02-27 NOTE — ED Notes (Signed)
Perfect Served provider about patient's urine output and patient reporting that he has the urge to pee but is unable to do so.

## 2023-02-27 NOTE — ED Notes (Signed)
PT and OT at bedside

## 2023-02-27 NOTE — TOC Initial Note (Signed)
Transition of Care [facility]) - Initial/Assessment Note    Patient Details  Name: Joseph Hogan MRN: [id number] Date of Birth: 1960-03-11  Transition of Care [facility]) CM/SW Contact:    Karn Cassis, LCSW Phone Number: 02/27/2023, 2:51 PM  Clinical Narrative:  Assessment completed with pt. He reports he lives alone, but has good support from his 3 children and a neighbor. PT evaluated pt and recommend SNF. Discussed with pt who wants to return home. He states his daughter is working on arranging care at home and is considering taking FMLA in order to be with patient at home. He requests TOC follow up in AM to see if this has been arranged. Pt is active with [facility]. Per Kandee Keen, if pt returns home will add PT. Pt currently on chemotherapy for metastatic rectal cancer. Palliative consulted. TOC will follow.                Expected Discharge Plan: Home w Home Health Services Barriers to Discharge: Continued Medical Work up   Patient Goals and CMS Choice Patient states their goals for this hospitalization and ongoing recovery are:: return home   Choice offered to / list presented to : Patient [place] ownership interest in [facility].provided to::  (n/a)    Expected Discharge Plan and Services In-house Referral: Clinical Social Work   Post Acute Care Choice: Home Health Living arrangements for the past 2 months: Single Family Home                           HH Arranged: RN, PT HH Agency: [facility] Date [facility] Agency Contacted: 02/27/23 Time HH Agency Contacted: 1448 Representative spoke with at [facility] Agency: Kandee Keen  Prior Living Arrangements/Services Living arrangements for the past 2 months: Single Family Home Lives with:: Self Patient language and need for interpreter reviewed:: Yes Do you feel safe going back to the place where you live?: Yes      Need for Family Participation in Patient Care: Yes (Comment)   Current home services: DME (walker,  wheelchair)    Activities of Daily Living   ADL Screening (condition at time of admission) Independently performs ADLs?: Yes (appropriate for developmental age) Is the patient deaf or have difficulty hearing?: No Does the patient have difficulty seeing, even when wearing glasses/contacts?: No Does the patient have difficulty concentrating, remembering, or making decisions?: No  Permission Sought/Granted                  Emotional Assessment     Affect (typically observed): Appropriate Orientation: : Oriented to Self, Oriented to Place, Oriented to  Time, Oriented to Situation Alcohol / Substance Use: Not Applicable Psych Involvement: No (comment)  Admission diagnosis:  Metastatic cancer (HCC) [C79.9] Patient Active Problem List   Diagnosis Date Noted   Metastatic cancer (HCC) 02/27/2023   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) 10/15/2022   Cancer associated pain 10/14/2022   Acneiform rash 09/25/2022   Iron deficiency anemia due to chronic blood loss 04/17/2022   Port-A-Cath in place 04/08/2022   Rectal carcinoma (HCC) 03/19/2022   Lytic bone lesion of femur 02/06/2022   Hip fracture (HCC) 02/03/2022   Lumbar radiculopathy 02/03/2022   Leukocytosis 02/03/2022   Hyperlipidemia 12/23/2016   Bilateral knee pain 06/29/2015   Arthritis, 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   Diabetes mellitus without complication (HCC) 04/11/2015   Morbid obesity (HCC) 01/12/2015   Essential hypertension, benign 01/10/2015   PCP:  [name], MD Pharmacy:   [facility] [place], [place] - [address] [place] [address] Phone: [phone] Fax: [phone]  [facility] [address] [place] [address] Phone: [phone] Fax: [phone]     Social Drivers of Health (SDOH) Social History: SDOH Screenings   Food Insecurity: No Food Insecurity (02/27/2023)  Housing: Low Risk  (02/27/2023)   Transportation Needs: No Transportation Needs (02/27/2023)  Utilities: Not At Risk (02/27/2023)  Tobacco Use: Low Risk  (02/27/2023)   SDOH Interventions:     Readmission Risk Interventions    10/17/2022   12:12 PM  Readmission Risk Prevention Plan  Transportation Screening Complete  Medication Review (RN Care Manager) Complete  PCP or Specialist appointment within 3-5 days of discharge Complete  HRI or Home Care Consult Complete  SW Recovery Care/Counseling Consult Complete  Palliative Care Screening Not Applicable  Skilled Nursing Facility Not Applicable

## 2023-02-28 ENCOUNTER — Telehealth: Payer: Self-pay | Admitting: *Deleted

## 2023-02-28 DIAGNOSIS — C7951 Secondary malignant neoplasm of bone: Secondary | ICD-10-CM | POA: Diagnosis not present

## 2023-02-28 DIAGNOSIS — R531 Weakness: Secondary | ICD-10-CM | POA: Diagnosis not present

## 2023-02-28 DIAGNOSIS — M79605 Pain in left leg: Secondary | ICD-10-CM

## 2023-02-28 LAB — TYPE AND SCREEN
ABO/RH(D): O POS
Antibody Screen: NEGATIVE
Unit division: 0

## 2023-02-28 LAB — BPAM RBC
Blood Product Expiration Date: 202502102359
ISSUE DATE / TIME: 202501160757
Unit Type and Rh: 5100

## 2023-02-28 MED ORDER — NYSTATIN 100000 UNIT/GM EX POWD
Freq: Three times a day (TID) | CUTANEOUS | Status: DC
  Administered 2023-03-02 – 2023-03-03 (×2): 1 via TOPICAL
  Filled 2023-02-28 (×3): qty 15

## 2023-02-28 MED ORDER — CHLORHEXIDINE GLUCONATE CLOTH 2 % EX PADS
6.0000 | MEDICATED_PAD | Freq: Every day | CUTANEOUS | Status: DC
  Administered 2023-02-28 – 2023-03-06 (×7): 6 via TOPICAL

## 2023-02-28 NOTE — Plan of Care (Signed)

## 2023-02-28 NOTE — [place] FL2 (Signed)
[place] MEDICAID FL2 LEVEL OF CARE FORM     IDENTIFICATION  Patient Name: Joseph Hogan Birthdate: Apr 14, 1960 Sex: male Admission Date (Current Location): 02/26/2023  [facility] and [place] Number:  [employer] and Address:  [facility],  [address], [place] [address]      Provider Number: [id number]  Attending Physician Name and Address:  Vassie Loll, MD  Relative Name and Phone Number:       Current Level of Care: Hospital Recommended Level of Care: Skilled Nursing Facility Prior Approval Number:    Date Approved/Denied:   PASRR Number: [id number]  Discharge Plan: SNF    Current Diagnoses: Patient Active Problem List   Diagnosis Date Noted   Metastatic cancer (HCC) 02/27/2023   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) 10/15/2022   Cancer associated pain 10/14/2022   Acneiform rash 09/25/2022   Iron deficiency anemia due to chronic blood loss 04/17/2022   Port-A-Cath in place 04/08/2022   Rectal carcinoma (HCC) 03/19/2022   Lytic bone lesion of femur 02/06/2022   Hip fracture (HCC) 02/03/2022   Lumbar radiculopathy 02/03/2022   Leukocytosis 02/03/2022   Hyperlipidemia 12/23/2016   Bilateral knee pain 06/29/2015   Arthritis, 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   Diabetes mellitus without complication (HCC) 04/11/2015   Morbid obesity (HCC) 01/12/2015   Essential hypertension, benign 01/10/2015    Orientation RESPIRATION BLADDER Height & Weight     Self, Time, Situation, Place  Normal External catheter Weight: 176 lb 5.9 oz (80 kg) Height:  5\' 11"  (180.3 cm)  BEHAVIORAL SYMPTOMS/MOOD NEUROLOGICAL BOWEL NUTRITION STATUS      Continent Diet (See d/c summary)  AMBULATORY STATUS COMMUNICATION OF NEEDS Skin   Extensive Assist Verbally Other (Comment) (bilateral buttocks non pressure wound)                       Personal Care Assistance Level of Assistance  Bathing, Feeding, Dressing Bathing Assistance: Maximum assistance Feeding  assistance: Limited assistance Dressing Assistance: Maximum assistance     Functional Limitations Info  Sight, Hearing, Speech Sight Info: Adequate Hearing Info: Adequate Speech Info: Adequate    SPECIAL CARE FACTORS FREQUENCY  OT (By licensed OT)     PT Frequency: 5x weekly OT Frequency: 5x weekly            Contractures Contractures Info: Not present    Additional Factors Info  Code Status, Psychotropic, Allergies Code Status Info: Full code Allergies Info: Codeine, Dilaudid (hydromorphone Hcl), Lisinopril           Current Medications (02/28/2023):  This is the current hospital active medication list Current Facility-Administered Medications  Medication Dose Route Frequency Provider Last Rate Last Admin   acetaminophen (TYLENOL) tablet 1,000 mg  1,000 mg Oral Q6H PRN Segars, Christiane Ha, MD       Chlorhexidine Gluconate Cloth 2 % PADS 6 each  6 each Topical Daily Vassie Loll, MD       cyanocobalamin (VITAMIN B12) tablet 1,000 mcg  1,000 mcg Oral Daily Segars, Christiane Ha, MD   1,000 mcg at 02/28/23 0826   enoxaparin (LOVENOX) injection 40 mg  40 mg Subcutaneous Q24H Dolly Rias, MD   40 mg at 02/28/23 0827   fentaNYL (SUBLIMAZE) injection 50 mcg  50 mcg Intravenous Q2H PRN Dove, Tasha A, NP       HYDROmorphone (DILAUDID) injection 0.5 mg  0.5 mg Intravenous Q4H PRN Dolly Rias, MD       oxyCODONE (Oxy IR/ROXICODONE) immediate release tablet  5 mg  5 mg Oral Q6H PRN Dolly Rias, MD       Or   oxyCODONE (Oxy IR/ROXICODONE) immediate release tablet 10 mg  10 mg Oral Q6H PRN Segars, Christiane Ha, MD       oxyCODONE (OXYCONTIN) 12 hr tablet 40 mg  40 mg Oral Q12H Dolly Rias, MD   40 mg at 02/28/23 5784   senna-docusate (Senokot-S) tablet 1 tablet  1 tablet Oral BID Dolly Rias, MD   1 tablet at 02/28/23 0826   sodium chloride flush (NS) 0.9 % injection 3 mL  3 mL Intravenous Q12H Dolly Rias, MD   3 mL at 02/28/23 0827   tamsulosin (FLOMAX) capsule  0.4 mg  0.4 mg Oral QPC supper Dolly Rias, MD   0.4 mg at 02/27/23 1805     Discharge Medications: Please see discharge summary for a list of discharge medications.  Relevant Imaging Results:  Relevant Lab Results:   Additional Information SSN: [id number]  Elliot Gault, LCSW

## 2023-02-28 NOTE — Progress Notes (Signed)
Was not able to bladder scan patient at 0600 due to another patient having respiratory difficulties.  Did pass on to oncoming dayshift RN, Merry Proud that patient would need to be bladder scanned.

## 2023-02-28 NOTE — Telephone Encounter (Signed)
Received vm message from pt's daughter, Jolaine Click.  Returned call to her. Spoke with her. She states that her father is still in patient @ [facility]. She is asking if her father can be transferred here to [facility] so that Dr. Leonides Schanz can see pt while admitted. Spoke with Dr. Leonides Schanz. He states he will be out of town most of next week and part of the following week. He recommended that family contact Dr. Caren Hazy @ [facility] to see if he will see pt while he is admitted.  Relayed that information to [place]. She voiced understanding and will contact Dr. Charm Rings office. No immediate discharge plans at this time.

## 2023-02-28 NOTE — TOC Progression Note (Signed)
Transition of Care [facility]) - Progression Note    Patient Details  Name: Joseph Hogan MRN: [id number] Date of Birth: 04-13-1960  Transition of Care [facility]) CM/SW Contact  Elliot Gault, LCSW Phone Number: 02/28/2023, 12:24 PM  Clinical Narrative:     TOC following. Per MD, pt agreeable to SNF as backup dc plan if pt/family cannot arrange enough care for pt at home. Spoke with pt and his dtr at bedside to review above. Pt expresses he is willing to allow TOC to send out for SNF with the understanding that if they decide that they can manage at home with [facility] and other caregivers, that is what he prefers to do.   CMS provider options for SNF reviewed and will refer as requested. MD anticipating dc Monday. Will start insurance auth as appropriate.  Will follow.  Expected Discharge Plan: Home w Home Health Services Barriers to Discharge: Continued Medical Work up  Expected Discharge Plan and Services In-house Referral: Clinical Social Work   Post Acute Care Choice: Home Health Living arrangements for the past 2 months: Single Family Home                           HH Arranged: RN, PT HH Agency: [facility] Date [facility] Agency Contacted: 02/27/23 Time HH Agency Contacted: [phone] Representative spoke with at [facility] Agency: Kandee Keen   Social Determinants of Health (SDOH) Interventions SDOH Screenings   Food Insecurity: No Food Insecurity (02/27/2023)  Housing: Low Risk  (02/27/2023)  Transportation Needs: No Transportation Needs (02/27/2023)  Utilities: Not At Risk (02/27/2023)  Tobacco Use: Low Risk  (02/27/2023)    Readmission Risk Interventions    10/17/2022   12:12 PM  Readmission Risk Prevention Plan  Transportation Screening Complete  Medication Review (RN Care Manager) Complete  PCP or Specialist appointment within 3-5 days of discharge Complete  HRI or Home Care Consult Complete  SW Recovery Care/Counseling Consult Complete  Palliative Care Screening Not Applicable   Skilled Nursing Facility Not Applicable

## 2023-02-28 NOTE — Progress Notes (Signed)
Progress Note   Patient: Joseph Hogan [id number]:[id number] DOB: 1960/08/23 DOA: 02/26/2023     0 DOS: the patient was seen and examined on 02/28/2023   Brief hospital admission course: As per H&P written by Dr. Duard Larsen on 02/27/2023 Joseph Hogan is a 63 y.o. male with hx of metastatic rectal cancer, diffuse bony mets, on chemotherapy with FOLFIRI/panitumumab, recently completed palliative radiation treatment, diabetes, hypertension, hyperlipidemia, who was brought in by EMS from home due to left leg pain and worsening weakness.  He reports ongoing cancer related pain which is not controlled with his home medications.  He has been taking his home long-acting oxycodone ER however not using his IR medications due to concern this is resulting in side effects including more somnolence and intermittent hallucinations.  He has been having worsening left leg pain ongoing for the past few weeks.  Reports the pain radiates down posterior thigh on the left.  There is swelling in both lower extremities which waxes and wanes.  Currently asymmetric on the left.  No history of VTE in the past.  Typically ambulates with the use of the walker but has not been able to get up as much due to pain.  Denies any numbness or tingling.  Does feel weak in both legs.  Otherwise denies any recent cough/cold, shortness of breath, chest pain.  Does report sometimes having issues with swallowing but takes in a regular diet.   Assessment and Plan: 1-left lower extremity pain -Workup demonstrating new bony metastatic lesions on images/x-ray -Dopplers studies demonstrating no presence of DVT. -Continue pain management, muscle relaxant and weightbearing as tolerated with assistance/recommendation for rehabilitation and conditioning at a skilled nursing facility. -Case discussed with patient and daughter at bedside they would like to explore options for returning home with home health services is possible.  2-metastatic rectal cancer with  diffuse bony metastasis -Actively receiving chemotherapy and radiation therapy -Continue outpatient follow-up with oncology service  3-acute kidney injury/urinary retention -Continue treatment with Flomax -Patient has required to In-N-Out catheterization to alleviate retention -No signs of acute infection appreciated -If he ended requiring another episode of In-N-Out cath will place Foley catheter and arrange follow-up with urology service.  4-anemia of chronic disease -Most likely associated with chemotherapy and underlying malignancy -No overt bleeding appreciated -Status post 1 unit PRBCs transfusion -Will continue to follow hemoglobin trend.  5-proctitis  -suspected to be related to underlying malignancy -Continue to hold antibiotics.  6-tree-in-bud opacity inferior right upper lobe -Patient remained without symptoms for pneumonia, no fever, good saturation on room air and no of coughing spells. -Patient has been seen by speech therapy with recommendations for dysphagia 3 diet to facilitate chewing process and increase oral intake; no significant difficulties or swallowing impairments appreciated.  7-stage I buttocks wound/fungal dermatitis -Present at time of admission -No signs of superimposed infection -No active drainage -Continue constant repositioning and local care. -Topical nystatin has been ordered.  Subjective:  No fever, no chest pain, no nausea, no vomiting, no complaints of shortness of breath.  Good saturation on room air.  Still expressing pain on his legs but overall improvement reported with pain management.  Physical Exam: Vitals:   02/27/23 1531 02/27/23 2020 02/28/23 0445 02/28/23 1430  BP: 115/76 124/81 121/66 135/75  Pulse: 81 83 83 84  Resp: 16   17  Temp: 98.5 F (36.9 C) 99.1 F (37.3 C) 98.8 F (37.1 C) 98 F (36.7 C)  TempSrc: Oral Oral Oral Oral  SpO2: 100% 95%  97% 100%  Weight:      Height:       General exam: Alert, awake, oriented x  3; weak and deconditioned.  Chronically ill in appearance. Respiratory system: No using accessory muscle.  Good saturation on room air. Cardiovascular system:RRR. No murmurs, rubs, gallops. Gastrointestinal system: Abdomen is nondistended, soft and nontender. No organomegaly or masses felt. Normal bowel sounds heard. Central nervous system:  No focal neurological deficits. Extremities: No cyanosis or clubbing. Skin: No petechiae; stage I right buttocks wound present at time of admission without signs of superimposed infection.  Fungal dermatitis changes appreciated in his groin and scrotal area. Psychiatry: Judgement and insight appear normal. Mood & affect appropriate.   Data Reviewed: Magnesium 1.8 Phosphorus: 3.9  Family Communication: Daughter at bedside.  Disposition: Status is: Observation The patient will require care spanning > 2 midnights and should be moved to inpatient because: Supportive care and analgesic therapy.   Planned Discharge Destination: Skilled nursing facility   Time spent: 50 minutes  Author: Vassie Loll, MD 02/28/2023 6:20 PM  For on call review [email or web address].

## 2023-02-28 NOTE — Evaluation (Signed)
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   with abnormal R wave progression noted on EKG   MRSA (methicillin resistant Staphylococcus aureus)    Pre-diabetes    Rectal carcinoma (HCC) 03/19/2022   Past Surgical History:  Past Surgical History:  Procedure Laterality Date   COLONOSCOPY     HUMERUS IM NAIL Right 04/12/2022   Procedure: INTRAMEDULLARY (IM) NAIL HUMERAL;  Surgeon: Bjorn Pippin, MD;  Location: [facility];  Service: Orthopedics;  Laterality: Right;   IR IMAGING GUIDED PORT INSERTION  04/05/2022   TOTAL HIP ARTHROPLASTY Left 02/06/2022   Procedure: TOTAL HIP ARTHROPLASTY;  Surgeon: Joen Laura, MD;  Location: [facility];  Service: Orthopedics;  Laterality: Left;   HPI:  Joseph Hogan is a 63 y.o. male with hx of metastatic rectal cancer, diffuse bony mets, on chemotherapy with FOLFIRI/panitumumab, recently completed palliative radiation treatment, diabetes, hypertension, hyperlipidemia, who was brought in by EMS from home due to left leg pain and worsening weakness.  He reports ongoing cancer related pain which is not controlled with his home medications.  He has been taking his home long-acting oxycodone ER however not using his IR medications due to concern this is resulting in side effects including more somnolence and intermittent hallucinations.  He has been having worsening left leg pain ongoing for the past few weeks.  Reports the pain radiates down posterior thigh on the left.  There is swelling in both lower extremities which waxes and wanes.  Currently asymmetric on the left.  No history of  VTE in the past.  Typically ambulates with the use of the walker but has not been able to get up as much due to pain.  Denies any numbness or tingling.  Does feel weak in both legs.  Otherwise denies any recent cough/cold, shortness of breath, chest pain.  Does report sometimes having issues with swallowing but takes in a regular diet. (per MD) BSE requested.    Assessment / Plan / Recommendation  Clinical Impression  Clinical swallowing evaluation completed while Pt was sitting upright in bed. Pt consumed all textures and consistencies presented without overt s/sx of oropharyngeal dysphagia. Note generalized weakness. Pt reports some xerostomia that is "better today". SLP provided recommendations including dry mouth mouth wash. SLP reviewed universal aspiration precautions. Recommend downgrade Pt's diet to D3/mech soft to facilitate energy conservation and encourage increased PO intake. Recommend continue with thin liquids and meds are ok whole with liquids. There are no further ST needs noted at this time. Our service will sign off. Thank you for this referral. SLP Visit Diagnosis: Dysphagia, unspecified (R13.10)    Aspiration Risk  Risk for inadequate nutrition/hydration    Diet Recommendation Dysphagia 3 (Mech soft);Thin liquid    Liquid Administration via: Straw;Cup Medication Administration: Whole meds with liquid Supervision: Patient able to self feed Compensations: Minimize environmental distractions;Slow rate;Small sips/bites Postural Changes: Seated upright at 90 degrees    Other  Recommendations Oral Care Recommendations: Oral care BID    Recommendations for follow up therapy are one component of a multi-disciplinary discharge planning process, led by the attending physician.  Recommendations may be updated based on patient status, additional functional criteria and insurance authorization.  Follow up Recommendations No SLP follow up         Functional Status Assessment Patient  has had a recent decline in their functional status and demonstrates the ability to make significant improvements in function in a reasonable and predictable amount of time.  Frequency and Duration             Swallow Study   General Date of Onset: 02/26/23 HPI: Joseph Hogan is a 63 y.o. male with hx of metastatic rectal cancer, diffuse bony mets, on chemotherapy with FOLFIRI/panitumumab, recently completed palliative radiation treatment, diabetes, hypertension, hyperlipidemia, who was brought in by EMS from home due to left leg pain and worsening weakness.  He reports ongoing cancer related pain which is not controlled with his home medications.  He has been taking his home long-acting oxycodone ER however not using his IR medications due to concern this is resulting in side effects including more somnolence and intermittent hallucinations.  He has been having worsening left leg pain ongoing for the past few weeks.  Reports the pain radiates down posterior thigh on the left.  There is swelling in both lower extremities which waxes and wanes.  Currently asymmetric on the left.  No history of VTE in the past.  Typically ambulates with the use of the walker but has not been able to get up as much due to pain.  Denies any numbness or tingling.  Does feel weak in both legs.  Otherwise denies any recent cough/cold, shortness of breath, chest pain.  Does report sometimes having issues with swallowing but takes in a regular diet. (per MD) BSE requested. Type of Study: Bedside Swallow Evaluation Previous Swallow Assessment: none in chart Diet Prior to this Study: Regular;Thin liquids (Level 0) Temperature Spikes Noted: No Respiratory Status: Room air History of Recent Intubation: No Behavior/Cognition: Alert;Cooperative;Pleasant mood Oral Cavity Assessment: Within Functional Limits Oral Care Completed by SLP: Recent completion by staff Oral Cavity - Dentition: Adequate natural dentition Vision:  Functional for self-feeding Self-Feeding Abilities: Able to feed self;Needs set up Patient Positioning: Upright in bed Baseline Vocal Quality: Normal Volitional Cough: Strong Volitional Swallow: Able to elicit    Oral/Motor/Sensory Function Overall Oral Motor/Sensory Function: Within functional limits   Ice Chips Ice chips: Within functional limits   Thin Liquid Thin Liquid: Within functional limits    Nectar Thick Nectar Thick Liquid: Not tested   Honey Thick Honey Thick Liquid: Not tested   Puree Puree: Within functional limits   Solid     Solid: Within functional limits     Bree Heinzelman H. Romie Levee, CCC-SLP Speech Language Pathologist  Georgetta Haber 02/28/2023,10:56 AM

## 2023-03-01 DIAGNOSIS — M79605 Pain in left leg: Secondary | ICD-10-CM | POA: Diagnosis not present

## 2023-03-01 DIAGNOSIS — C7951 Secondary malignant neoplasm of bone: Secondary | ICD-10-CM | POA: Diagnosis not present

## 2023-03-01 DIAGNOSIS — R531 Weakness: Secondary | ICD-10-CM | POA: Diagnosis not present

## 2023-03-01 LAB — BASIC METABOLIC PANEL
Anion gap: 10 (ref 5–15)
BUN: 14 mg/dL (ref 8–23)
CO2: 26 mmol/L (ref 22–32)
Calcium: 9.1 mg/dL (ref 8.9–10.3)
Chloride: 102 mmol/L (ref 98–111)
Creatinine, Ser: 0.64 mg/dL (ref 0.61–1.24)
GFR, Estimated: >60 mL/min (ref >60)
Glucose, Bld: 119 mg/dL — ABNORMAL HIGH (ref 70–99)
Potassium: 4 mmol/L (ref 3.5–5.1)
Sodium: 138 mmol/L (ref 135–145)

## 2023-03-01 LAB — CBC
HCT: 25 % — ABNORMAL LOW (ref 39.0–52.0)
Hemoglobin: 7.6 g/dL — ABNORMAL LOW (ref 13.0–17.0)
MCH: 26.3 pg (ref 26.0–34.0)
MCHC: 30.4 g/dL (ref 30.0–36.0)
MCV: 86.5 fL (ref 80.0–100.0)
Platelets: 358 10*3/uL (ref 150–400)
RBC: 2.89 MIL/uL — ABNORMAL LOW (ref 4.22–5.81)
RDW: 16.4 % — ABNORMAL HIGH (ref 11.5–15.5)
WBC: 9.4 10*3/uL (ref 4.0–10.5)
nRBC: 0 % (ref 0.0–0.2)

## 2023-03-01 NOTE — Progress Notes (Signed)
Progress Note   Patient: Joseph Hogan BJY:[id number] DOB: 1960-11-28 DOA: 02/26/2023     0 DOS: the patient was seen and examined on 03/01/2023   Brief hospital admission course: As per H&P written by Dr. Duard Larsen on 02/27/2023 [name] is a 63 y.o. male with hx of metastatic rectal cancer, diffuse bony mets, on chemotherapy with FOLFIRI/panitumumab, recently completed palliative radiation treatment, diabetes, hypertension, hyperlipidemia, who was brought in by EMS from home due to left leg pain and worsening weakness.  He reports ongoing cancer related pain which is not controlled with his home medications.  He has been taking his home long-acting oxycodone ER however not using his IR medications due to concern this is resulting in side effects including more somnolence and intermittent hallucinations.  He has been having worsening left leg pain ongoing for the past few weeks.  Reports the pain radiates down posterior thigh on the left.  There is swelling in both lower extremities which waxes and wanes.  Currently asymmetric on the left.  No history of VTE in the past.  Typically ambulates with the use of the walker but has not been able to get up as much due to pain.  Denies any numbness or tingling.  Does feel weak in both legs.  Otherwise denies any recent cough/cold, shortness of breath, chest pain.  Does report sometimes having issues with swallowing but takes in a regular diet.   Assessment and Plan: 1-left lower extremity pain -Workup demonstrating new bony metastatic lesions on images/x-ray -Dopplers studies demonstrating no presence of DVT. -Continue pain management, muscle relaxant and weightbearing as tolerated with assistance/recommendation for rehabilitation and conditioning at a skilled nursing facility. -Case discussed with patient and daughter at bedside they would like to explore options for returning home with home health services if possible.  2-metastatic rectal cancer with  diffuse bony metastasis -Actively receiving chemotherapy and radiation therapy -Continue outpatient follow-up with oncology service  3-acute kidney injury/urinary retention -Continue treatment with Flomax -Patient has required to In-N-Out catheterization to alleviate retention -No signs of acute infection appreciated -If he ended requiring another episode of In-N-Out cath will place Foley catheter and arrange follow-up with urology service.  4-anemia of chronic disease -Most likely associated with chemotherapy and underlying malignancy -No overt bleeding appreciated -Status post 1 unit PRBCs transfusion -Will continue to follow hemoglobin trend.  5-proctitis  -suspected to be related to underlying malignancy -Continue to hold antibiotics.  6-tree-in-bud opacity inferior right upper lobe -Patient remained without symptoms for pneumonia, no fever, good saturation on room air and no of coughing spells. -Patient has been seen by speech therapy with recommendations for dysphagia 3 diet to facilitate chewing process and increase oral intake; no significant difficulties or swallowing impairments appreciated.  7-stage I buttocks wound/fungal dermatitis -Present at time of admission -No signs of superimposed infection -No active drainage -Continue constant repositioning and local care. -Topical nystatin has been ordered.  Subjective:  No fever, no chest pain, no nausea vomiting.  Complaining of pain in his legs.  Patient is weak, deconditioned and chronically ill in appearance.  Physical Exam: Vitals:   02/28/23 1430 02/28/23 2104 03/01/23 0444 03/01/23 1335  BP: 135/75 (!) 141/79 122/76 (!) 118/91  Pulse: 84 92 88 (!) 108  Resp: 17 20 16 18   Temp: 98 F (36.7 C) 98.1 F (36.7 C) 98.2 F (36.8 C) 98 F (36.7 C)  TempSrc: Oral Oral Oral   SpO2: 100% 98% 98% 99%  Weight:  Height:       General exam: Alert, awake, oriented x 3; generally weak, deconditioned and chronically  ill in appearance.  Expressing pain in his legs bilaterally and requiring a lot of assistance and help just with transferring from bed to chair (per PT evaluation). Respiratory system: Good saturation on room air; not requiring oxygen supplementation.  Normal respiratory effort. Cardiovascular system: No rubs, no gallops, no JVD. Gastrointestinal system: Abdomen is nondistended, soft and nontender.  Positive bowel sounds. Central nervous system: Generally weak.  No focal neurological deficits. Extremities: No cyanosis or clubbing. Skin: No petechiae; fungal dermatitis changes appreciated in his groin and scrotal area.  Stage I right buttocks wound present at time of admission without signs of superimposed infection. Psychiatry: Flat affect appreciated.  Data Reviewed: CBC: WBCs 9.4, hemoglobin 7.6 and platelet count 358K Basic metabolic panel: Sodium 138, potassium 4.0, chloride 102, bicarb 26, BUN 14, creatinine 0.64 and GFR >60  Family Communication: No family at bedside during today's evaluation.  Disposition: Status is: Observation The patient will require care spanning > 2 midnights and should be moved to inpatient because: Supportive care and analgesic therapy.   Planned Discharge Destination: Skilled nursing facility   Time spent: 50 minutes  Author: Vassie Loll, MD 03/01/2023 4:33 PM  For on call review [email or web address].

## 2023-03-01 NOTE — Plan of Care (Signed)
  Problem: Education: Goal: Knowledge of General Education information will improve Description: Including pain rating scale, medication(s)/side effects and non-pharmacologic comfort measures Outcome: Progressing   Problem: Health Behavior/Discharge Planning: Goal: Ability to manage health-related needs will improve Outcome: Progressing   Problem: Clinical Measurements: Goal: Will remain free from infection Outcome: Progressing Goal: Diagnostic test results will improve Outcome: Progressing   Problem: Nutrition: Goal: Adequate nutrition will be maintained Outcome: Progressing   Problem: Coping: Goal: Level of anxiety will decrease Outcome: Progressing   Problem: Elimination: Goal: Will not experience complications related to urinary retention Outcome: Progressing   Problem: Pain Managment: Goal: General experience of comfort will improve and/or be controlled Outcome: Progressing   Problem: Safety: Goal: Ability to remain free from injury will improve Outcome: Progressing   Problem: Skin Integrity: Goal: Risk for impaired skin integrity will decrease Outcome: Progressing

## 2023-03-01 NOTE — Progress Notes (Signed)
Physical Therapy Treatment Patient Details Name: Joseph Hogan MRN: [id number] DOB: [birth year] Today's Date: 03/01/2023   History of Present Illness Joseph Hogan is a 63 y.o. male with hx of metastatic rectal cancer, diffuse bony mets, on chemotherapy with FOLFIRI/panitumumab, recently completed palliative radiation treatment, diabetes, hypertension, hyperlipidemia, who was brought in by EMS from home due to left leg pain and worsening weakness.  He reports ongoing cancer related pain which is not controlled with his home medications.  He has been taking his home long-acting oxycodone ER however not using his IR medications due to concern this is resulting in side effects including more somnolence and intermittent hallucinations.  He has been having worsening left leg pain ongoing for the past few weeks.  Reports the pain radiates down posterior thigh on the left.  There is swelling in both lower extremities which waxes and wanes.  Currently asymmetric on the left.  No history of VTE in the past.  Typically ambulates with the use of the walker but has not been able to get up as much due to pain.  Denies any numbness or tingling.  Does feel weak in both legs.  Otherwise denies any recent cough/cold, shortness of breath, chest pain.  Does report sometimes having issues with swallowing but takes in a regular diet.    PT Comments  Patient had difficulty moving BLE during bed mobility mostly due to increased pain, very unsteady on feet and limited to a few slow unsteady labored side steps before having to sit.  Patient unable to tolerate completing BLE exercises seated at bedside due to leg pain, but able to complete with active assistance while sitting in chair.  Patient tolerated sitting up in chair after therapy - nurse notified. Patient will benefit from continued skilled physical therapy in hospital and recommended venue below to increase strength, balance, endurance for safe ADLs and gait.      If  plan is discharge home, recommend the following: A lot of help with walking and/or transfers;A lot of help with bathing/dressing/bathroom;Assistance with cooking/housework;Help with stairs or ramp for entrance   Can travel by private vehicle     No  Equipment Recommendations  None recommended by PT    Recommendations for Other Services       Precautions / Restrictions Precautions Precautions: Fall Restrictions Weight Bearing Restrictions Per Provider Order: No     Mobility  Bed Mobility Overal bed mobility: Needs Assistance Bed Mobility: Supine to Sit     Supine to sit: Min assist, Mod assist     General bed mobility comments: demonstrates improvement for sitting upa t bedside with HOB flat, but had to use bed rail during supine to sitting    Transfers Overall transfer level: Needs assistance Equipment used: Rolling walker (2 wheels) Transfers: Sit to/from Stand, Bed to chair/wheelchair/BSC Sit to Stand: Mod assist   Step pivot transfers: Mod assist       General transfer comment: unsteady labored movement    Ambulation/Gait Ambulation/Gait assistance: Mod assist, Max assist Gait Distance (Feet): 3 Feet Assistive device: Rolling walker (2 wheels) Gait Pattern/deviations: Decreased step length - right, Decreased step length - left, Decreased stance time - left, Knees buckling, Antalgic Gait velocity: slow     General Gait Details: limited to a few slow labored side steps due to weakness, buckling of knees   Stairs             Wheelchair Mobility     Tilt Bed  Modified Rankin (Stroke Patients Only)       Balance Overall balance assessment: Needs assistance Sitting-balance support: Feet supported, No upper extremity supported Sitting balance-Leahy Scale: Fair Sitting balance - Comments: fair/good seated at EOB   Standing balance support: Reliant on assistive device for balance, During functional activity, Bilateral upper extremity  supported Standing balance-Leahy Scale: Poor Standing balance comment: using RW                            Cognition Arousal: Alert Behavior During Therapy: WFL for tasks assessed/performed Overall Cognitive Status: Within Functional Limits for tasks assessed                                          Exercises      General Comments        Pertinent Vitals/Pain Pain Assessment Pain Assessment: 0-10 Pain Score: 9  Pain Location: hips, legs Pain Descriptors / Indicators: Grimacing, Discomfort, Guarding, Sharp Pain Intervention(s): Limited activity within patient's tolerance, Monitored during session, Repositioned    Home Living                          Prior Function            PT Goals (current goals can now be found in the care plan section) Acute Rehab PT Goals Patient Stated Goal: return home with family to assist PT Goal Formulation: With patient Time For Goal Achievement: 03/13/23 Potential to Achieve Goals: Good Progress towards PT goals: Progressing toward goals    Frequency    Min 3X/week      PT Plan      Co-evaluation              AM-PAC PT "6 Clicks" Mobility   Outcome Measure  Help needed turning from your back to your side while in a flat bed without using bedrails?: A Lot Help needed moving from lying on your back to sitting on the side of a flat bed without using bedrails?: A Lot Help needed moving to and from a bed to a chair (including a wheelchair)?: A Lot Help needed standing up from a chair using your arms (e.g., wheelchair or bedside chair)?: A Lot Help needed to walk in hospital room?: A Lot Help needed climbing 3-5 steps with a railing? : Total 6 Click Score: 11    End of Session   Activity Tolerance: Patient tolerated treatment well;Patient limited by fatigue Patient left: in bed;with call bell/phone within reach Nurse Communication: Mobility status PT Visit Diagnosis: Unsteadiness  on feet (R26.81);Other abnormalities of gait and mobility (R26.89);Muscle weakness (generalized) (M62.81)     Time: 1610-9604 PT Time Calculation (min) (ACUTE ONLY): 22 min  Charges:    $Therapeutic Exercise: 8-22 mins $Therapeutic Activity: 8-22 mins PT General Charges $$ ACUTE PT VISIT: 1 Visit                     1:32 PM, 03/01/23 Ocie Bob, MPT Physical Therapist with [facility] 336 [phone] office [phone] mobile phone

## 2023-03-01 NOTE — Plan of Care (Signed)

## 2023-03-02 DIAGNOSIS — K6289 Other specified diseases of anus and rectum: Secondary | ICD-10-CM | POA: Diagnosis present

## 2023-03-02 DIAGNOSIS — Z885 Allergy status to narcotic agent status: Secondary | ICD-10-CM | POA: Diagnosis not present

## 2023-03-02 DIAGNOSIS — Z8249 Family history of ischemic heart disease and other diseases of the circulatory system: Secondary | ICD-10-CM | POA: Diagnosis not present

## 2023-03-02 DIAGNOSIS — N179 Acute kidney failure, unspecified: Secondary | ICD-10-CM | POA: Diagnosis present

## 2023-03-02 DIAGNOSIS — D630 Anemia in neoplastic disease: Secondary | ICD-10-CM | POA: Diagnosis present

## 2023-03-02 DIAGNOSIS — Z85048 Personal history of other malignant neoplasm of rectum, rectosigmoid junction, and anus: Secondary | ICD-10-CM | POA: Diagnosis not present

## 2023-03-02 DIAGNOSIS — E860 Dehydration: Secondary | ICD-10-CM | POA: Diagnosis present

## 2023-03-02 DIAGNOSIS — L89321 Pressure ulcer of left buttock, stage 1: Secondary | ICD-10-CM | POA: Diagnosis present

## 2023-03-02 DIAGNOSIS — R339 Retention of urine, unspecified: Secondary | ICD-10-CM | POA: Diagnosis present

## 2023-03-02 DIAGNOSIS — Z9221 Personal history of antineoplastic chemotherapy: Secondary | ICD-10-CM | POA: Diagnosis not present

## 2023-03-02 DIAGNOSIS — E119 Type 2 diabetes mellitus without complications: Secondary | ICD-10-CM | POA: Diagnosis present

## 2023-03-02 DIAGNOSIS — Z7189 Other specified counseling: Secondary | ICD-10-CM | POA: Diagnosis not present

## 2023-03-02 DIAGNOSIS — G893 Neoplasm related pain (acute) (chronic): Secondary | ICD-10-CM | POA: Diagnosis present

## 2023-03-02 DIAGNOSIS — C7951 Secondary malignant neoplasm of bone: Secondary | ICD-10-CM | POA: Diagnosis present

## 2023-03-02 DIAGNOSIS — E43 Unspecified severe protein-calorie malnutrition: Secondary | ICD-10-CM | POA: Diagnosis present

## 2023-03-02 DIAGNOSIS — Z888 Allergy status to other drugs, medicaments and biological substances status: Secondary | ICD-10-CM | POA: Diagnosis not present

## 2023-03-02 DIAGNOSIS — L89311 Pressure ulcer of right buttock, stage 1: Secondary | ICD-10-CM | POA: Diagnosis present

## 2023-03-02 DIAGNOSIS — R531 Weakness: Secondary | ICD-10-CM | POA: Diagnosis not present

## 2023-03-02 DIAGNOSIS — K869 Disease of pancreas, unspecified: Secondary | ICD-10-CM | POA: Diagnosis present

## 2023-03-02 DIAGNOSIS — B369 Superficial mycosis, unspecified: Secondary | ICD-10-CM | POA: Diagnosis present

## 2023-03-02 DIAGNOSIS — C20 Malignant neoplasm of rectum: Secondary | ICD-10-CM | POA: Diagnosis not present

## 2023-03-02 DIAGNOSIS — R52 Pain, unspecified: Secondary | ICD-10-CM | POA: Diagnosis not present

## 2023-03-02 DIAGNOSIS — Z6824 Body mass index (BMI) 24.0-24.9, adult: Secondary | ICD-10-CM | POA: Diagnosis not present

## 2023-03-02 DIAGNOSIS — E785 Hyperlipidemia, unspecified: Secondary | ICD-10-CM | POA: Diagnosis present

## 2023-03-02 DIAGNOSIS — D509 Iron deficiency anemia, unspecified: Secondary | ICD-10-CM | POA: Diagnosis present

## 2023-03-02 DIAGNOSIS — M79605 Pain in left leg: Secondary | ICD-10-CM | POA: Diagnosis not present

## 2023-03-02 DIAGNOSIS — Z515 Encounter for palliative care: Secondary | ICD-10-CM | POA: Diagnosis not present

## 2023-03-02 DIAGNOSIS — C799 Secondary malignant neoplasm of unspecified site: Secondary | ICD-10-CM | POA: Diagnosis present

## 2023-03-02 DIAGNOSIS — L89150 Pressure ulcer of sacral region, unstageable: Secondary | ICD-10-CM | POA: Diagnosis present

## 2023-03-02 DIAGNOSIS — I10 Essential (primary) hypertension: Secondary | ICD-10-CM | POA: Diagnosis present

## 2023-03-02 MED ORDER — LIDOCAINE HCL URETHRAL/MUCOSAL 2 % EX GEL
1.0000 | Freq: Once | CUTANEOUS | Status: AC
  Administered 2023-03-02: 1 via URETHRAL
  Filled 2023-03-02: qty 5

## 2023-03-02 NOTE — Progress Notes (Signed)
Progress Note   Patient: Joseph Hogan:[id number] DOB: 10-03-1960 DOA: 02/26/2023     0 DOS: the patient was seen and examined on 03/02/2023   Brief hospital admission course: As per H&P written by Dr. Duard Larsen on 02/27/2023 Joseph Hogan is a 63 y.o. male with hx of metastatic rectal cancer, diffuse bony mets, on chemotherapy with FOLFIRI/panitumumab, recently completed palliative radiation treatment, diabetes, hypertension, hyperlipidemia, who was brought in by EMS from home due to left leg pain and worsening weakness.  He reports ongoing cancer related pain which is not controlled with his home medications.  He has been taking his home long-acting oxycodone ER however not using his IR medications due to concern this is resulting in side effects including more somnolence and intermittent hallucinations.  He has been having worsening left leg pain ongoing for the past few weeks.  Reports the pain radiates down posterior thigh on the left.  There is swelling in both lower extremities which waxes and wanes.  Currently asymmetric on the left.  No history of VTE in the past.  Typically ambulates with the use of the walker but has not been able to get up as much due to pain.  Denies any numbness or tingling.  Does feel weak in both legs.  Otherwise denies any recent cough/cold, shortness of breath, chest pain.  Does report sometimes having issues with swallowing but takes in a regular diet.   Assessment and Plan: 1-left lower extremity pain -Workup demonstrating new bony metastatic lesions on images/x-ray -Dopplers studies demonstrating no presence of DVT. -Continue pain management, muscle relaxant and weightbearing as tolerated with assistance/recommendation for rehabilitation and conditioning at a skilled nursing facility. -Case discussed with patient and daughter at bedside they would like to explore options for returning home with home health services if possible.  2-metastatic rectal cancer with  diffuse bony metastasis -Actively receiving chemotherapy and radiation therapy -Continue outpatient follow-up with oncology service  3-acute kidney injury/urinary retention -Continue treatment with Flomax -Patient has required to In-N-Out catheterization to alleviate retention -No signs of acute infection appreciated -Given inability to void and with significant post void urine  retention on bladder scan (>350Ml) Foley catheter has been placed.  4-anemia of chronic disease -Most likely associated with chemotherapy and underlying malignancy -No overt bleeding appreciated -Status post 1 unit PRBCs transfusion -Will continue to follow hemoglobin trend.  5-proctitis  -suspected to be related to underlying malignancy -Continue to hold antibiotics.  6-tree-in-bud opacity inferior right upper lobe -Patient remained without symptoms for pneumonia, no fever, good saturation on room air and no of coughing spells. -Patient has been seen by speech therapy with recommendations for dysphagia 3 diet to facilitate chewing process and increase oral intake; no significant difficulties or swallowing impairments appreciated.  7-stage I buttocks wound/fungal dermatitis -Present at time of admission -No signs of superimposed infection -No active drainage -Continue constant repositioning and local care. -Topical nystatin has been ordered.   Subjective:  No fever, no chest pain, no nausea vomiting.  Good saturation on room air appreciated.  Still complaining of pain in his legs (better).  Main complaint urinary retention and inability to void.  Physical Exam: Vitals:   03/01/23 2020 03/01/23 2129 03/02/23 0501 03/02/23 1454  BP: 113/65 127/69 115/67 112/74  Pulse: 84 92 82 88  Resp: 19 17 18 20   Temp: 98.2 F (36.8 C) 98.2 F (36.8 C) 98.2 F (36.8 C) 98.2 F (36.8 C)  TempSrc: Oral Oral Oral Oral  SpO2: 97%  97% 98% 99%  Weight:      Height:       General exam: Alert, awake, oriented x 3;  complaining of difficulty urinating; no fever, no chest pain, no nausea vomiting. Respiratory system: Clear to auscultation. Respiratory effort normal.  Good saturation on room air. Cardiovascular system:RRR. No rubs or gallops; no JVD. Gastrointestinal system: Abdomen is nondistended, soft and nontender. No organomegaly or masses felt. Normal bowel sounds heard. Central nervous system: Generalized weakness.  No focal neurological deficits. Extremities: No cyanosis or clubbing.  Complaining of pain in his legs bilaterally (left more than right). Skin: No petechiae; positive fungal dermatitis changes appreciated in his groin and scrotal area (improving).  Stage I right buttocks wound present at time of admission without signs of superimposed infection. Psychiatry: Flat affect appreciated.  Data Reviewed: CBC: WBCs 9.4, hemoglobin 7.6 and platelet count 358K Basic metabolic panel: Sodium 138, potassium 4.0, chloride 102, bicarb 26, BUN 14, creatinine 0.64 and GFR >60  Family Communication: No family at bedside during today's evaluation.  Disposition: Status is: Observation The patient will require care spanning > 2 midnights and should be moved to inpatient because: Supportive care and analgesic therapy.   Planned Discharge Destination: Skilled nursing facility   Time spent: 50 minutes  Author: Vassie Loll, MD 03/02/2023 6:26 PM  For on call review [email or web address].

## 2023-03-02 NOTE — Progress Notes (Signed)
Patient attempted to void and was able to produce 75ml of urine. Urine was amber in color. Patient is currently refusing an in-and-out catheterization and states he wants to keep trying to void. Post-void bladder scan revealed of urine remaining in the bladder. Will continue to monitor and reassess.

## 2023-03-02 NOTE — Plan of Care (Signed)
  Problem: Education: Goal: Knowledge of General Education information will improve Description: Including pain rating scale, medication(s)/side effects and non-pharmacologic comfort measures Outcome: Progressing   Problem: Coping: Goal: Level of anxiety will decrease Outcome: Not Progressing   Problem: Elimination: Goal: Will not experience complications related to urinary retention Outcome: Not Progressing

## 2023-03-03 DIAGNOSIS — M79605 Pain in left leg: Secondary | ICD-10-CM | POA: Diagnosis not present

## 2023-03-03 DIAGNOSIS — C20 Malignant neoplasm of rectum: Secondary | ICD-10-CM | POA: Diagnosis not present

## 2023-03-03 DIAGNOSIS — R52 Pain, unspecified: Secondary | ICD-10-CM | POA: Diagnosis not present

## 2023-03-03 DIAGNOSIS — C7951 Secondary malignant neoplasm of bone: Secondary | ICD-10-CM | POA: Diagnosis not present

## 2023-03-03 DIAGNOSIS — Z515 Encounter for palliative care: Secondary | ICD-10-CM

## 2023-03-03 DIAGNOSIS — Z7189 Other specified counseling: Secondary | ICD-10-CM

## 2023-03-03 DIAGNOSIS — R531 Weakness: Secondary | ICD-10-CM | POA: Diagnosis not present

## 2023-03-03 LAB — CULTURE, BLOOD (ROUTINE X 2)
Culture: NO GROWTH
Culture: NO GROWTH
Special Requests: ADEQUATE
Special Requests: ADEQUATE

## 2023-03-03 MED ORDER — DEXAMETHASONE 4 MG PO TABS
4.0000 mg | ORAL_TABLET | Freq: Two times a day (BID) | ORAL | Status: DC
  Administered 2023-03-03 – 2023-03-06 (×6): 4 mg via ORAL
  Filled 2023-03-03 (×6): qty 1

## 2023-03-03 NOTE — Progress Notes (Signed)
Palliative: [name] is lying quietly in bed.  He appears acutely/chronically ill and very frail.  He is sleeping soundly and does not wake to voice.  I believe that he can make his needs known.  There is no family at bedside at this time.  Call to daughter, Joseph Hogan.  No answer, left voicemail message.  Conference with attending, bedside nursing staff, transition of care team related to patient condition, needs, goals of care, disposition.  Plan: At this point oncology is recommending radiation treatment for bone pain and hospice care.  Patient and family are seeking short-term rehab in [place].  25 minutes  Lillia Carmel, NP Palliative medicine team  Team phone [phone]

## 2023-03-03 NOTE — Progress Notes (Signed)
Physical Therapy Treatment Patient Details Name: Joseph Hogan MRN: [id number] DOB: 06-Apr-1960 Today's Date: 03/03/2023   History of Present Illness KABE ARTHER is a 63 y.o. male with hx of metastatic rectal cancer, diffuse bony mets, on chemotherapy with FOLFIRI/panitumumab, recently completed palliative radiation treatment, diabetes, hypertension, hyperlipidemia, who was brought in by EMS from home due to left leg pain and worsening weakness.  He reports ongoing cancer related pain which is not controlled with his home medications.  He has been taking his home long-acting oxycodone ER however not using his IR medications due to concern this is resulting in side effects including more somnolence and intermittent hallucinations.  He has been having worsening left leg pain ongoing for the past few weeks.  Reports the pain radiates down posterior thigh on the left.  There is swelling in both lower extremities which waxes and wanes.  Currently asymmetric on the left.  No history of VTE in the past.  Typically ambulates with the use of the walker but has not been able to get up as much due to pain.  Denies any numbness or tingling.  Does feel weak in both legs.  Otherwise denies any recent cough/cold, shortness of breath, chest pain.  Does report sometimes having issues with swallowing but takes in a regular diet.    PT Comments  Patient demonstrates improvement for propping up on elbows to hands and scooting to EOB during bed mobility, increased endurance for standing while being cleaned using RW and tolerated take a few steps at bedside before having to sit due to fatigue and buckling of knees.  Patient tolerated sitting up in chair after therapy. Patient will benefit from continued skilled physical therapy in hospital and recommended venue below to increase strength, balance, endurance for safe ADLs and gait.     If plan is discharge home, recommend the following: A lot of help with walking and/or  transfers;A lot of help with bathing/dressing/bathroom;Assistance with cooking/housework;Help with stairs or ramp for entrance   Can travel by private vehicle     No  Equipment Recommendations  None recommended by PT    Recommendations for Other Services       Precautions / Restrictions Precautions Precautions: Fall Restrictions Weight Bearing Restrictions Per Provider Order: No     Mobility  Bed Mobility Overal bed mobility: Needs Assistance Bed Mobility: Supine to Sit     Supine to sit: Min assist, Mod assist, HOB elevated     General bed mobility comments: fair/good return for propping up on elbows with HOB slightly elevated    Transfers Overall transfer level: Needs assistance Equipment used: Rolling walker (2 wheels) Transfers: Sit to/from Stand, Bed to chair/wheelchair/BSC Sit to Stand: Mod assist   Step pivot transfers: Mod assist       General transfer comment: slow labored movement with increased endurance for standing with RW while being cleaned    Ambulation/Gait Ambulation/Gait assistance: Mod assist, Max assist Gait Distance (Feet): 4 Feet Assistive device: Rolling walker (2 wheels) Gait Pattern/deviations: Decreased step length - right, Decreased step length - left, Decreased stance time - left, Knees buckling, Antalgic Gait velocity: slow     General Gait Details: limited to a few side steps at bedside before having to sit due to knees buckling   Stairs             Wheelchair Mobility     Tilt Bed    Modified Rankin (Stroke Patients Only)  Balance Overall balance assessment: Needs assistance Sitting-balance support: Feet supported, No upper extremity supported Sitting balance-Leahy Scale: Fair Sitting balance - Comments: fair/good seated at EOB   Standing balance support: Reliant on assistive device for balance, During functional activity, Bilateral upper extremity supported Standing balance-Leahy Scale: Poor Standing  balance comment: using RW                            Cognition Arousal: Alert Behavior During Therapy: WFL for tasks assessed/performed Overall Cognitive Status: Within Functional Limits for tasks assessed                                          Exercises General Exercises - Lower Extremity Gluteal Sets: Seated, AROM, Strengthening, Both, 10 reps Long Arc Quad: Seated, AROM, Strengthening, 5 reps Hip Flexion/Marching: Seated, AAROM, Strengthening, Both, 10 reps    General Comments        Pertinent Vitals/Pain Pain Assessment Pain Assessment: Faces Faces Pain Scale: Hurts little more Pain Location: hips, legs Pain Descriptors / Indicators: Dull, Grimacing Pain Intervention(s): Limited activity within patient's tolerance, Monitored during session, Repositioned    Home Living                          Prior Function            PT Goals (current goals can now be found in the care plan section) Acute Rehab PT Goals Patient Stated Goal: return home with family to assist PT Goal Formulation: With patient Time For Goal Achievement: 03/13/23 Potential to Achieve Goals: Good Progress towards PT goals: Progressing toward goals    Frequency    Min 3X/week      PT Plan      Co-evaluation              AM-PAC PT "6 Clicks" Mobility   Outcome Measure  Help needed turning from your back to your side while in a flat bed without using bedrails?: A Lot Help needed moving from lying on your back to sitting on the side of a flat bed without using bedrails?: A Lot Help needed moving to and from a bed to a chair (including a wheelchair)?: A Lot Help needed standing up from a chair using your arms (e.g., wheelchair or bedside chair)?: A Lot Help needed to walk in hospital room?: A Lot Help needed climbing 3-5 steps with a railing? : Total 6 Click Score: 11    End of Session   Activity Tolerance: Patient tolerated treatment  well;Patient limited by fatigue Patient left: in chair;with call bell/phone within reach Nurse Communication: Mobility status PT Visit Diagnosis: Unsteadiness on feet (R26.81);Other abnormalities of gait and mobility (R26.89);Muscle weakness (generalized) (M62.81)     Time: 4098-1191 PT Time Calculation (min) (ACUTE ONLY): 31 min  Charges:    $Therapeutic Exercise: 8-22 mins $Therapeutic Activity: 8-22 mins PT General Charges $$ ACUTE PT VISIT: 1 Visit                     2:20 PM, 03/03/23 Ocie Bob, MPT Physical Therapist with [facility] [phone] office [phone] mobile phone

## 2023-03-03 NOTE — Progress Notes (Signed)
Progress Note   Patient: Joseph Hogan:[id number] DOB: 02-08-1961 DOA: 02/26/2023     1 DOS: the patient was seen and examined on 03/03/2023   Brief hospital admission course: As per H&P written by Dr. Duard Larsen on 02/27/2023 Joseph Hogan is a 62 y.o. male with hx of metastatic rectal cancer, diffuse bony mets, on chemotherapy with FOLFIRI/panitumumab, recently completed palliative radiation treatment, diabetes, hypertension, hyperlipidemia, who was brought in by EMS from home due to left leg pain and worsening weakness.  He reports ongoing cancer related pain which is not controlled with his home medications.  He has been taking his home long-acting oxycodone ER however not using his IR medications due to concern this is resulting in side effects including more somnolence and intermittent hallucinations.  He has been having worsening left leg pain ongoing for the past few weeks.  Reports the pain radiates down posterior thigh on the left.  There is swelling in both lower extremities which waxes and wanes.  Currently asymmetric on the left.  No history of VTE in the past.  Typically ambulates with the use of the walker but has not been able to get up as much due to pain.  Denies any numbness or tingling.  Does feel weak in both legs.  Otherwise denies any recent cough/cold, shortness of breath, chest pain.  Does report sometimes having issues with swallowing but takes in a regular diet.   Assessment and Plan: 1-left lower extremity pain -Workup demonstrating new bony metastatic lesions on images/x-ray -Dopplers studies demonstrating no presence of DVT. -Continue pain management, muscle relaxant and weightbearing as tolerated with assistance/recommendation for rehabilitation and conditioning at a skilled nursing facility. -Case discussed with patient and daughter at bedside they would like to explore options for returning home with home health services if possible. -After discussing with oncology  service will pursued outpatient palliative radiation to his legs to help with pain management -Also starting treatment with Decadron.  2-metastatic rectal cancer with diffuse bony metastasis -Actively receiving chemotherapy and radiation therapy -Oncology service (Dr. Ellin Saba) has met with patient and family and expressed that at this moment his condition has further worsened and there is no further intervention that can be provided. -He recommends to focus on symptomatic management and comfort pursuing palliative intervention to assist with pain. -Family would like rehabilitation and outpatient radiation toward his legs as recommended by oncology service with hospice follow-up after.  3-acute kidney injury/urinary retention -Continue treatment with Flomax -Patient has required to In-N-Out catheterization to alleviate retention -No signs of acute infection appreciated -Given inability to void and with significant post void urine  retention on bladder scan (>350Ml) Foley catheter has been placed. -Continue to maintain adequate hydration.  4-anemia of chronic disease -Most likely associated with chemotherapy and underlying malignancy -No overt bleeding appreciated. -Status post 1 unit PRBCs transfusion -Will continue to follow hemoglobin trend.  5-proctitis  -suspected to be related to underlying malignancy -Continue to hold antibiotics.  6-tree-in-bud opacity inferior right upper lobe -Patient remained without symptoms for pneumonia, no fever, good saturation on room air and no of coughing spells. -Patient has been seen by speech therapy with recommendations for dysphagia 3 diet to facilitate chewing process and increase oral intake; no significant difficulties or swallowing impairments appreciated.  7-stage I buttocks wound/fungal dermatitis -Present at time of admission -No signs of superimposed infection -No active drainage -Continue constant repositioning and local  care. -Topical nystatin has been ordered.   Subjective:  No fever, no  chest pain, no nausea and no vomiting.  Patient continues present pain in his lower extremities (left more than right).  Physical Exam: Vitals:   03/02/23 1454 03/02/23 2002 03/03/23 0314 03/03/23 1528  BP: 112/74 118/69 117/68 120/72  Pulse: 88 84 81 84  Resp: 20 18 18 20   Temp: 98.2 F (36.8 C) 98.4 F (36.9 C) 97.8 F (36.6 C) 98.4 F (36.9 C)  TempSrc: Oral Oral Oral Oral  SpO2: 99% 99% 98% 100%  Weight:      Height:       General exam: Alert, awake, oriented x 3; continue complaining of pain in his lower extremity (left more than right).  No nausea, no vomiting, no chest pain. Respiratory system: Clear to auscultation. Respiratory effort normal.  Good saturation on room air. Cardiovascular system: No rubs, no gallops, rate controlled. Gastrointestinal system: Abdomen is nondistended, soft and nontender.  Positive bowel sounds. Central nervous system: No focal neurological deficits. Extremities: No cyanosis or clubbing.  Left lower extremity swelling and pain appreciated on examination. Skin: No petechiae; positive fungal dermatitis changes appreciated in his groin and scrotal area (much improved); stage I right buttocks wound present at time of admission without signs of superimposed infection. Psychiatry: Flat affect appreciated on exam.  Data Reviewed: CBC: WBCs 9.4, hemoglobin 7.6 and platelet count 358K Basic metabolic panel: Sodium 138, potassium 4.0, chloride 102, bicarb 26, BUN 14, creatinine 0.64 and GFR >60  Family Communication: Patient's daughter at bedside.  Disposition: Status is: Inpatient The patient will require care spanning > 2 midnights and should be moved to inpatient because: IV therapy/analgesic therapy.   Planned Discharge Destination: Skilled nursing facility   Time spent: 50 minutes  Author: Vassie Loll, MD 03/03/2023 5:06 PM  For on call review [email or web address].

## 2023-03-03 NOTE — TOC Progression Note (Signed)
Transition of Care [facility]) - Progression Note    Patient Details  Name: Joseph Hogan MRN: [id number] Date of Birth: 11/28/1960  Transition of Care [facility]) CM/SW Contact  Karn Cassis, [place] Phone Number: 03/03/2023, 1:02 PM  Clinical Narrative: Pt/children accept bed at [facility]. Facility notified. CMA updating insurance.       Expected Discharge Plan: Home w Home Health Services Barriers to Discharge: Continued Medical Work up  Expected Discharge Plan and Services In-house Referral: Clinical Social Work   Post Acute Care Choice: Home Health Living arrangements for the past 2 months: Single Family Home                           HH Arranged: RN, PT HH Agency: [facility] Date [facility] Agency Contacted: 02/27/23 Time HH Agency Contacted: [phone] Representative spoke with at [facility] Agency: Kandee Keen   Social Determinants of Health (SDOH) Interventions SDOH Screenings   Food Insecurity: No Food Insecurity (02/27/2023)  Housing: Low Risk  (02/27/2023)  Transportation Needs: No Transportation Needs (02/27/2023)  Utilities: Not At Risk (02/27/2023)  Tobacco Use: Low Risk  (02/27/2023)    Readmission Risk Interventions    10/17/2022   12:12 PM  Readmission Risk Prevention Plan  Transportation Screening Complete  Medication Review (RN Care Manager) Complete  PCP or Specialist appointment within 3-5 days of discharge Complete  HRI or Home Care Consult Complete  SW Recovery Care/Counseling Consult Complete  Palliative Care Screening Not Applicable  Skilled Nursing Facility Not Applicable

## 2023-03-03 NOTE — TOC Progression Note (Signed)
Transition of Care [facility]) - Progression Note    Patient Details  Name: Joseph Hogan MRN: [id number] Date of Birth: [birth year]  Transition of Care [facility]) CM/SW Contact  Karn Cassis, [place] Phone Number: 03/03/2023, 2:22 PM  Clinical Narrative: Per MD, family now requesting SNF in [facility] so pt can go to daily radiation treatments. LCSW left voicemail for daughter to discuss. Resent to [facility] SNFs to review. CMA updated as Berk[facility] Harvey will need to be switched to new facility. LCSW confirmed [facility] will not transport to daily radiation.       Expected Discharge Plan: Home w Home Health Services Barriers to Discharge: Continued Medical Work up  Expected Discharge Plan and Services In-house Referral: Clinical Social Work   Post Acute Care Choice: Home Health Living arrangements for the past 2 months: Single Family Home                           HH Arranged: RN, PT HH Agency: [facility] Date [facility] Agency Contacted: 02/27/23 Time HH Agency Contacted: 1448 Representative spoke with at [facility] Agency: Kandee Keen   Social Determinants of Health (SDOH) Interventions SDOH Screenings   Food Insecurity: No Food Insecurity (02/27/2023)  Housing: Low Risk  (02/27/2023)  Transportation Needs: No Transportation Needs (02/27/2023)  Utilities: Not At Risk (02/27/2023)  Tobacco Use: Low Risk  (02/27/2023)    Readmission Risk Interventions    10/17/2022   12:12 PM  Readmission Risk Prevention Plan  Transportation Screening Complete  Medication Review (RN Care Manager) Complete  PCP or Specialist appointment within 3-5 days of discharge Complete  HRI or Home Care Consult Complete  SW Recovery Care/Counseling Consult Complete  Palliative Care Screening Not Applicable  Skilled Nursing Facility Not Applicable

## 2023-03-03 NOTE — Consult Note (Signed)
[facility] Consultation Oncology  Name: Joseph Hogan      MRN: [id number]    Location: A317/A317-01  Date: 03/03/2023 Time:11:45 AM   REFERRING PHYSICIAN: Dr. Gwenlyn Perking  REASON FOR CONSULT: Metastatic rectal cancer   DIAGNOSIS: New left proximal tibial metastasis  HISTORY OF PRESENT ILLNESS: [name] is a 63 year old male seen today at the request of Dr. Gwenlyn Perking for opinion regarding metastatic colorectal cancer.  He is under the care of Dr. Leonides Schanz and was last seen by him on 01/30/2023.  Currently receiving FOLFIRI and panitumumab.  Last treatment cycle 11 was on 01/30/2023.  Presented to the emergency room on 02/26/2023 with left leg pain and worsening weakness.  CT angiogram of the chest and CT AP on 02/26/2023 was negative for pulmonary embolism.  Interval development of bilateral hilar and mediastinal lymphadenopathy.  Scattered axial and appendicular mixed lytic and sclerotic metastatic lesions, stable to slightly increased in size with expansile pedunculated lesion arising from the left inferior pubic rami.  Interval increase in size of multiple expansile lytic rib lesions.  Ultrasound Doppler of the left leg was negative for DVT.  However it showed bony changes in the left lower leg potentially additional sites of metastatic disease.  X-ray of the left tibia and fibula showed expansile ill-defined lesion in the left proximal tibial diaphysis concerning for metastatic disease.  Patient's son and daughter at bedside.  According to them, he is gradually getting weaker and lost some weight as well.  He is not able to function well at home.  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 IV and PO admins are effective.    Lisinopril Cough      MEDICATIONS: I have reviewed the patient's current medications.     PAST SURGICAL HISTORY Past Surgical History:  Procedure Laterality Date   COLONOSCOPY     HUMERUS IM NAIL Right 04/12/2022   Procedure: INTRAMEDULLARY (IM) NAIL HUMERAL;  Surgeon: Bjorn Pippin, MD;  Location: [facility]S;  Service: Orthopedics;  Laterality: Right;   IR IMAGING GUIDED PORT INSERTION  04/05/2022   TOTAL HIP ARTHROPLASTY Left 02/06/2022   Procedure: TOTAL HIP ARTHROPLASTY;  Surgeon: Joen Laura, MD;  Location: [facility];  Service: Orthopedics;  Laterality: Left;    FAMILY HISTORY: Family History  Problem Relation Age of Onset   Stroke Mother    Hypertension Mother    Heart disease Father    Alzheimer's disease Father    Hypertension Brother    Alcohol abuse Brother    Cancer Maternal Aunt    Heart disease Maternal Aunt    Cancer Maternal Uncle    Heart disease Maternal Uncle    Cancer Paternal Aunt    Heart disease Paternal Aunt    Cancer Paternal Uncle    Heart disease Paternal Uncle    Heart disease Paternal Grandmother    Alzheimer's disease Paternal Grandfather    Stomach cancer Neg Hx    Rectal cancer Neg Hx    Esophageal cancer Neg  Hx    Colon cancer Neg Hx     SOCIAL HISTORY:  reports that he has never smoked. He has never used smokeless tobacco. He reports that he does not drink alcohol and does not use drugs.  PERFORMANCE STATUS: The patient's performance status is 3 - Symptomatic, >50% confined to bed  PHYSICAL EXAM: Most Recent Vital Signs: Blood pressure 117/68, pulse 81, temperature 97.8 F (36.6 C), temperature source Oral, resp. rate 18, height 5\' 11"  (1.803 m), weight 176 lb 5.9 oz (80 kg), SpO2 98%. BP 120/72 (BP Location: Right Arm)   Pulse 84   Temp 98.4 F  (36.9 C) (Oral)   Resp 20   Ht 5\' 11"  (1.803 m)   Wt 176 lb 5.9 oz (80 kg)   SpO2 100%   BMI 24.60 kg/m  General appearance: alert, cooperative, and appears older than stated age Extremities:  Left leg swollen below the knee.  LABORATORY DATA:  No results found for this or any previous visit (from the past 48 hours).    RADIOGRAPHY: No results found.      ASSESSMENT and PLAN:  1.  Metastatic rectal cancer to the bones and lymph nodes: - 6 cycles of FOLFOX and bevacizumab from 04/08/2022 through 07/02/2022 with progression - FOLFIRI and panitumumab started on 07/30/2022, cycle 11 on 01/30/2023 - CT angiogram of the chest and CTAP on 02/26/2023: Interval development of bilateral hilar and mediastinal adenopathy.  Scattered axial and appendicular metastatic lesions stable to slightly increased in size of the expansile pancreatic lesion from the left inferior pubic rami.  Interval increase in size of multiple expansile lytic rib lesions, largest measuring 4.9 x 3.5 cm.  Negative for pulmonary embolism. - X-ray left tibia/fibula (02/26/2022): Expansile destructive lesion in the proximal tibial diaphysis with periosteal reaction and cortical breakthrough medially measuring 11.4 x 4.2 cm. - I have discussed findings on the CT scan and x-ray which showed progression with the patient, his son and daughter who are at bedside. - Recommended radiation oncology evaluation and treatment for new left proximal tibial mass. - We also discussed further management of his metastatic cancer.  He has not had any prolonged response with 2 regimens he has received so far.  I have also reviewed his molecular NGS test results.  No targetable mutations identified. - We discussed further options including trifluridine/tipiracil plus bevacizumab and fruquintinib.  The response rates in both regimens are low.  We also discussed potential side effects with above regimens. - With the performance status that he is in now, I  do not believe he is a candidate for either of those regimens. - I have discussed best supportive care in the form of hospice.  They are agreeable. - They are seeking for him to be placed at a nursing facility. - For his bone pain, I have recommended adding dexamethasone 2-4 mg twice daily and titrate up as tolerated. - Discussed with Dr. Gwenlyn Perking.  All questions were answered. The patient knows to call the clinic with any problems, questions or concerns. We can certainly see the patient much sooner if necessary.    Doreatha Massed

## 2023-03-04 ENCOUNTER — Other Ambulatory Visit (HOSPITAL_COMMUNITY): Payer: Self-pay

## 2023-03-04 DIAGNOSIS — C7951 Secondary malignant neoplasm of bone: Secondary | ICD-10-CM | POA: Diagnosis not present

## 2023-03-04 DIAGNOSIS — R52 Pain, unspecified: Secondary | ICD-10-CM | POA: Diagnosis not present

## 2023-03-04 DIAGNOSIS — C20 Malignant neoplasm of rectum: Secondary | ICD-10-CM | POA: Diagnosis not present

## 2023-03-04 NOTE — Progress Notes (Signed)
Progress Note   Patient: Joseph Hogan WUJ:[id number] DOB: [birth year] DOA: 02/26/2023     2 DOS: the patient was seen and examined on 03/04/2023   Brief hospital admission course: As per H&P written by Dr. Duard Larsen on 02/27/2023 Joseph Hogan is a 63 y.o. male with hx of metastatic rectal cancer, diffuse bony mets, on chemotherapy with FOLFIRI/panitumumab, recently completed palliative radiation treatment, diabetes, hypertension, hyperlipidemia, who was brought in by EMS from home due to left leg pain and worsening weakness.  He reports ongoing cancer related pain which is not controlled with his home medications.  He has been taking his home long-acting oxycodone ER however not using his IR medications due to concern this is resulting in side effects including more somnolence and intermittent hallucinations.  He has been having worsening left leg pain ongoing for the past few weeks.  Reports the pain radiates down posterior thigh on the left.  There is swelling in both lower extremities which waxes and wanes.  Currently asymmetric on the left.  No history of VTE in the past.  Typically ambulates with the use of the walker but has not been able to get up as much due to pain.  Denies any numbness or tingling.  Does feel weak in both legs.  Otherwise denies any recent cough/cold, shortness of breath, chest pain.  Does report sometimes having issues with swallowing but takes in a regular diet.   Assessment and Plan: 1-left lower extremity pain -Workup demonstrating new bony metastatic lesions on images/x-ray -Dopplers studies demonstrating no presence of DVT. -Continue pain management, muscle relaxant and weightbearing as tolerated with assistance/recommendation for rehabilitation and conditioning at a skilled nursing facility. -Case discussed with patient and daughter at bedside they would like to explore options for returning home with home health services if possible. -After discussing with oncology  service will pursued outpatient palliative radiation to his legs to help with pain management -Continue treatment with Decadron as recommended by oncology. -Family discussing regarding pursuing and now radiation; unfortunately no facility will be able to take him for radiation therapy. -Appreciate assistance for placement by TOC.  2-metastatic rectal cancer with diffuse bony metastasis -Actively receiving chemotherapy and radiation therapy -Oncology service (Dr. Ellin Saba) has met with patient and family and expressed that at this moment his condition has further worsened and there is no further intervention that can be provided. -He recommends to focus on symptomatic management and comfort pursuing palliative intervention to assist with pain. -Family would like rehabilitation and outpatient radiation toward his legs as recommended by oncology service with hospice follow-up after.  3-acute kidney injury/urinary retention -Continue treatment with Flomax -Patient has required to In-N-Out catheterization to alleviate retention -No signs of acute infection appreciated -Given inability to void and with significant post void urine  retention on bladder scan (>350Ml) Foley catheter has been placed. -Continue to maintain adequate hydration. -No planning for further lab work. -Follow recommendations by oncology service transitioning into symptomatic management.  4-anemia of chronic disease -Most likely associated with chemotherapy and underlying malignancy -No overt bleeding appreciated. -Status post 1 unit PRBCs transfusion -Will continue to follow hemoglobin trend.  5-proctitis  -suspected to be related to underlying malignancy -Continue to hold antibiotics.  6-tree-in-bud opacity inferior right upper lobe -Patient remained without symptoms for pneumonia, no fever, good saturation on room air and no of coughing spells. -Patient has been seen by speech therapy with recommendations for  dysphagia 3 diet to facilitate chewing process and increase oral intake; no  significant difficulties or swallowing impairments appreciated.  7-stage I buttocks wound/fungal dermatitis -Present at time of admission -No signs of superimposed infection -No active drainage -Continue constant repositioning and local care. -Topical nystatin has been ordered.  8-severe protein calorie malnutrition -After discussion transitioning into symptomatic management and comfort -Continue comfort feeding and as tolerated/wishing feeding supplements.   Subjective:  No fever, no chest pain, weak and deconditioned.  Chronically ill in appearance and underweight.  Still reporting pain in his legs bilaterally (left more than right).  Physical Exam: Vitals:   03/03/23 1528 03/03/23 2056 03/04/23 0533 03/04/23 1543  BP: 120/72 129/65 117/74 120/80  Pulse: 84 85 73 72  Resp: 20 18 16 20   Temp: 98.4 F (36.9 C) 98.8 F (37.1 C) 97.9 F (36.6 C) 98 F (36.7 C)  TempSrc: Oral   Oral  SpO2: 100% 97% 99% 100%  Weight:      Height:       General exam: Alert, awake and able to follow commands appropriately; continue complaining of lower extremity pains.  No nausea or vomiting.  Afebrile. Respiratory system: Clear to auscultation. Respiratory effort normal.  Good saturation on room air. Cardiovascular system:RRR. No rubs or gallops; no JVD. Gastrointestinal system: Abdomen is nondistended, soft and nontender. No organomegaly or masses felt. Normal bowel sounds heard. Central nervous system: Generally weak and deconditioned.  No focal neurological deficits. Extremities: No cyanosis or clubbing; swelling appreciated bilaterally (left more than right). Skin: No petechiae; right subclavian Port-A-Cath in place. Psychiatry: Flat affect appreciated.  Latest data Reviewed: CBC: WBCs 9.4, hemoglobin 7.6 and platelet count 358K Basic metabolic panel: Sodium 138, potassium 4.0, chloride 102, bicarb 26, BUN 14,  creatinine 0.64 and GFR >60  Family Communication: Patient's daughter at bedside.  Disposition: Status is: Inpatient The patient will require care spanning > 2 midnights and should be moved to inpatient because: IV therapy/analgesic therapy.   Planned Discharge Destination: Skilled nursing facility  Time spent: 35 minutes  Author: Vassie Loll, MD 03/04/2023 4:24 PM  For on call review [email or web address].

## 2023-03-04 NOTE — Plan of Care (Signed)

## 2023-03-04 NOTE — TOC Progression Note (Addendum)
Transition of Care [facility]) - Progression Note    Patient Details  Name: Joseph Hogan MRN: [id number] Date of Birth: 1960-10-20  Transition of Care [facility]) CM/SW Contact  Elliot Gault, LCSW Phone Number: 03/04/2023, 4:08 PM  Clinical Narrative:     TOC following. It appears that daily radiation for three weeks is a prohibitive factor for all SNFs. Have spoken with admissions team at remaining SNF options today and have been told that they cannot offer bed if pt will be receiving outpatient radiation treatment. This includes [facility] stating that they cannot offer even if the radiation plan is changed to be done at [facility].  Jill Side at [facility] states that if pt does not do the radiation at all and pt has insurance auth for SNF rehab, they should still be able to offer bed.   Spoke with pt's daughter to update on above. Provided information on options if SNF rehab doesn't work out. These options include returning home with or without hospice care and with or without the radiation. Or, LTC private pay at Nursing home with or without hospice and definitely without radiation. Dtr states she will discuss with her brother and they will try and discuss with pt tonight.  TOC will follow up tomorrow.  Expected Discharge Plan: Home w Home Health Services Barriers to Discharge: Continued Medical Work up  Expected Discharge Plan and Services In-house Referral: Clinical Social Work   Post Acute Care Choice: Home Health Living arrangements for the past 2 months: Single Family Home                           HH Arranged: RN, PT HH Agency: [facility] Date [facility] Agency Contacted: 02/27/23 Time HH Agency Contacted: 1448 Representative spoke with at [facility] Agency: Kandee Keen   Social Determinants of Health (SDOH) Interventions SDOH Screenings   Food Insecurity: No Food Insecurity (02/27/2023)  Housing: Low Risk  (02/27/2023)  Transportation Needs: No Transportation Needs (02/27/2023)   Utilities: Not At Risk (02/27/2023)  Tobacco Use: Low Risk  (02/27/2023)    Readmission Risk Interventions    10/17/2022   12:12 PM  Readmission Risk Prevention Plan  Transportation Screening Complete  Medication Review (RN Care Manager) Complete  PCP or Specialist appointment within 3-5 days of discharge Complete  HRI or Home Care Consult Complete  SW Recovery Care/Counseling Consult Complete  Palliative Care Screening Not Applicable  Skilled Nursing Facility Not Applicable

## 2023-03-05 ENCOUNTER — Encounter: Payer: Self-pay | Admitting: Hematology and Oncology

## 2023-03-05 DIAGNOSIS — Z515 Encounter for palliative care: Secondary | ICD-10-CM | POA: Diagnosis not present

## 2023-03-05 DIAGNOSIS — C7951 Secondary malignant neoplasm of bone: Secondary | ICD-10-CM | POA: Diagnosis not present

## 2023-03-05 DIAGNOSIS — Z7189 Other specified counseling: Secondary | ICD-10-CM | POA: Diagnosis not present

## 2023-03-05 MED ORDER — MUSCLE RUB 10-15 % EX CREA
TOPICAL_CREAM | CUTANEOUS | Status: DC | PRN
  Filled 2023-03-05: qty 85

## 2023-03-05 MED ORDER — OXYCODONE HCL ER 10 MG PO T12A
10.0000 mg | EXTENDED_RELEASE_TABLET | Freq: Two times a day (BID) | ORAL | Status: DC
  Administered 2023-03-05 – 2023-03-06 (×2): 10 mg via ORAL
  Filled 2023-03-05 (×2): qty 1

## 2023-03-05 MED ORDER — ONDANSETRON HCL 4 MG/2ML IJ SOLN
4.0000 mg | Freq: Four times a day (QID) | INTRAMUSCULAR | Status: DC | PRN
  Administered 2023-03-05 – 2023-03-06 (×2): 4 mg via INTRAVENOUS
  Filled 2023-03-05 (×2): qty 2

## 2023-03-05 NOTE — Progress Notes (Signed)
Palliative: [name] is sitting on the edge of the bed in his room.  He greets me, making and mostly keeping eye contact.  He is alert, able to make his needs known.  There is no family at bedside at this time.  Bedside nursing staff is present attending [name] to the bedside commode.  I share that I will return later.  Conference with attending, bedside nursing staff, transition of care team related to patient condition, needs.  Returned to room to find [name], lying quietly in bed.  He continues to appear acutely/chronically ill and very frail.  He greets me, making and somewhat keeping eye contact.  He is alert and oriented, able to make his needs known.  His daughter, Joseph Hogan, is present at bedside.   We talk about his acute health concerns, in particular his pain.  Pain management adjusted.  We talk about increases in pain for those with cancer can signal and increasing cancer.  We review images that indicate progression of cancer despite current treatment.  We talked about the benefits of radiation therapy for bone cancer pain management.  Joseph Hogan states that he wants to meet with his trusted oncologist, Dr. Leonides Hogan, when Dr. Leonides Hogan returns from vacation on Monday 1/27.  Joseph Hogan states that they are in touch with Dr. Derek Hogan office to set this up.  Joseph Hogan also states that he wants to talk with his trusted radiation oncologist, Dr. Mitzi Hogan.  He shares that he believes that with radiation therapy he will be, "healed".  I share that he is noted by his trusted oncologist to have stage IV, incurable, cancer.  Joseph Hogan tells me that he is holding faith that he will be healed.  Daughter Joseph Hogan states that she understands he has stage IV, incurable cancer.  I share that Joseph Hogan must be strong enough to take further cancer treatments, these may not be offered.  We talked about short-term rehab.  Joseph Hogan asks appropriate questions about rehab would look like.  I encouraged Joseph Hogan to consider where he  wants to spend his time.  I shared the difficulties in going to short-term rehab with anticipation of chemotherapy/radiation therapy.  Joseph Hogan shares that his niece and nephew are coming to prepare his home and plan to care for him at home.  He states that they are currently in town, getting his home ready.  We talked about CODE STATUS again today, "treat the treatable, but allow a natural passing".  Joseph Hogan asks his dog with her Joseph Hogan what she thinks.  She shares that she does not think that attempted resuscitation would be a good choice.  She shares that they would not be able to communicate with a little one another.   Conference with attending (face-to-face), bedside nursing staff, transition of care team related to patient condition, needs, goals of care, disposition.  Plan:  Continue to treat the treatable.  Considering holding radiation therapy until meet with trusted radiation oncology and medical oncology on 1/27.  Considering short term rehab.  Considering CODE STATUS.     60 minutes  Lillia Carmel, NP Palliative medicine team Team phone [phone]

## 2023-03-05 NOTE — Progress Notes (Signed)
Mobility Specialist Progress Note:    03/05/23 1239  Mobility  Activity Transferred from chair to bed  Level of Assistance Maximum assist, patient does 25-49%  Assistive Device Front wheel walker  Distance Ambulated (ft) 3 ft  Range of Motion/Exercises Active;All extremities  Activity Response Tolerated well  Mobility Referral Yes  Mobility visit 1 Mobility  Mobility Specialist Start Time (ACUTE ONLY) 1225  Mobility Specialist Stop Time (ACUTE ONLY) 1238  Mobility Specialist Time Calculation (min) (ACUTE ONLY) 13 min   Pt received requesting assistance to bed. Required MaxA to stand and transfer with RW, NT at bedside for assistance. Left pt supine, alarm on. All needs met.   Lawerance Bach Mobility Specialist Please contact via Special educational needs teacher or  Rehab office at [phone]

## 2023-03-05 NOTE — TOC Progression Note (Signed)
Transition of Care [facility]) - Progression Note    Patient Details  Name: Joseph Hogan MRN: [id number] Date of Birth: 1960/03/13  Transition of Care [facility]) CM/SW Contact  Karn Cassis, [place] Phone Number: 03/05/2023, 3:33 PM  Clinical Narrative: Pt/family still considering options together. Current SNF auth good through tomorrow and [facility] able to take pt if they decide not to pursue radiation. TOC will follow.       Expected Discharge Plan: Home w Home Health Services Barriers to Discharge: Continued Medical Work up  Expected Discharge Plan and Services In-house Referral: Clinical Social Work   Post Acute Care Choice: Home Health Living arrangements for the past 2 months: Single Family Home                           HH Arranged: RN, PT HH Agency: [facility] Date [facility] Agency Contacted: 02/27/23 Time HH Agency Contacted: 1448 Representative spoke with at [facility] Agency: Kandee Keen   Social Determinants of Health (SDOH) Interventions SDOH Screenings   Food Insecurity: No Food Insecurity (02/27/2023)  Housing: Low Risk  (02/27/2023)  Transportation Needs: No Transportation Needs (02/27/2023)  Utilities: Not At Risk (02/27/2023)  Tobacco Use: Low Risk  (02/27/2023)    Readmission Risk Interventions    10/17/2022   12:12 PM  Readmission Risk Prevention Plan  Transportation Screening Complete  Medication Review (RN Care Manager) Complete  PCP or Specialist appointment within 3-5 days of discharge Complete  HRI or Home Care Consult Complete  SW Recovery Care/Counseling Consult Complete  Palliative Care Screening Not Applicable  Skilled Nursing Facility Not Applicable

## 2023-03-05 NOTE — Progress Notes (Signed)
PROGRESS NOTE    Joseph Hogan  QVZ:[id number] DOB: Apr 02, 1960 DOA: 02/26/2023 PCP: [name], MD   Brief Narrative:    ALIAN Hogan is a 63 y.o. male with hx of metastatic rectal cancer, diffuse bony mets, on chemotherapy with FOLFIRI/panitumumab, recently completed palliative radiation treatment, diabetes, hypertension, hyperlipidemia, who was brought in by EMS from home due to left leg pain and worsening weakness.  He reports ongoing cancer related pain which is not controlled with his home medications.  Patient has been admitted for pain management in the setting of metastatic rectal cancer with diffuse bony metastasis.  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 -Continue pain management, muscle relaxant and weightbearing as tolerated with assistance/recommendation for rehabilitation and conditioning at a skilled nursing facility. -Case discussed with patient and daughter at bedside they would like to explore options for returning home with home health services if possible. -After discussing with oncology service will pursued outpatient palliative radiation to his legs to help with pain management -Continue treatment with Decadron as recommended by oncology. -Family discussing regarding pursuing and now radiation; unfortunately no facility will be able to take him for radiation therapy. -Appreciate assistance for placement by TOC.   2-metastatic rectal cancer with diffuse bony metastasis -Actively receiving chemotherapy and radiation therapy -Oncology service (Dr. Ellin Saba) has met with patient and family and expressed that at this moment his condition has further worsened and there is no further  intervention that can be provided. -He recommends to focus on symptomatic management and comfort pursuing palliative intervention to assist with pain. -Family would like rehabilitation and outpatient radiation toward his legs as recommended by oncology service with hospice follow-up after.   3-acute kidney injury/urinary retention -Continue treatment with Flomax -Patient has required to In-N-Out catheterization to alleviate retention -No signs of acute infection appreciated -Given inability to void and with significant post void urine  retention on bladder scan (>350Ml) Foley catheter has been placed. -Continue to maintain adequate hydration. -No planning for further lab work. -Follow recommendations by oncology service transitioning into symptomatic management.   4-anemia of chronic disease -Most likely associated with chemotherapy and underlying malignancy -No overt bleeding appreciated. -Status post 1 unit PRBCs transfusion -Will continue to follow hemoglobin trend.   5-proctitis  -suspected to be related to underlying malignancy -Continue to hold antibiotics.   6-tree-in-bud opacity inferior right upper lobe -Patient remained without symptoms for pneumonia, no fever, good saturation on room air and no of coughing spells. -Patient has been seen by speech therapy with recommendations for dysphagia 3 diet to facilitate chewing process and increase oral intake; no significant difficulties or swallowing impairments appreciated.   7-stage I buttocks wound/fungal dermatitis -Present at time of admission -No signs of superimposed infection -No active drainage -Continue constant repositioning and local care. -Topical nystatin has been ordered.   8-severe protein calorie malnutrition -After discussion transitioning into symptomatic management and comfort -Continue comfort feeding and as tolerated/wishing feeding supplements.   DVT prophylaxis: Lovenox Code Status: Full Family  Communication: None at bedside Disposition Plan:  Status is: Inpatient Remains inpatient appropriate because: Need for IV medications.  Consultants:  Oncology Palliative care  Procedures:  None  Antimicrobials:  None   Subjective: Patient seen and evaluated  today with ongoing acute pain that is not well-controlled despite multiple different pain medications.  Objective: Vitals:   03/04/23 1543 03/04/23 2000 03/05/23 0400 03/05/23 1449  BP: 120/80 133/79 (!) 144/79 (!) 141/88  Pulse: 72 69 64 80  Resp: 20 18 18 18   Temp: 98 F (36.7 C) 98 F (36.7 C) 97.9 F (36.6 C) 98 F (36.7 C)  TempSrc: Oral   Oral  SpO2: 100% 100% 98% 100%  Weight:      Height:        Intake/Output Summary (Last 24 hours) at 03/05/2023 1607 Last data filed at 03/05/2023 1316 Gross per 24 hour  Intake 840 ml  Output 750 ml  Net 90 ml   Filed Weights   02/26/23 1659  Weight: 80 kg    Examination:  General exam: Appears calm and comfortable  Respiratory system: Clear to auscultation. Respiratory effort normal.  Right-sided chest port. Cardiovascular system: S1 & S2 heard, RRR.  Gastrointestinal system: Abdomen is soft Central nervous system: Alert and awake Extremities: No edema Skin: No significant lesions noted Psychiatry: Flat affect.    Data Reviewed: I have personally reviewed following labs and imaging studies  CBC: Recent Labs  Lab 02/26/23 1959 02/27/23 0401 02/27/23 0520 03/01/23 0421  WBC 11.1* 8.5  --  9.4  NEUTROABS 9.7*  --   --   --   HGB 7.9* 6.9* 6.8* 7.6*  HCT 25.8* 22.2* 22.0* 25.0*  MCV 86.9 88.1  --  86.5  PLT 481* 402*  --  358   Basic Metabolic Panel: Recent Labs  Lab 02/26/23 1959 02/27/23 0401 03/01/23 0421  NA 136 136 138  K 3.8 3.8 4.0  CL 100 103 102  CO2 25 24 26   GLUCOSE 116* 129* 119*  BUN 29* 25* 14  CREATININE 0.89 0.74 0.64  CALCIUM 9.1 8.6* 9.1  MG  --  1.8  --   PHOS  --  3.9  --    GFR: Estimated Creatinine Clearance:  99.4 mL/min (by C-G formula based on SCr of 0.64 mg/dL). Liver Function Tests: Recent Labs  Lab 02/26/23 1959  AST 32  ALT 12  ALKPHOS 203*  BILITOT 0.6  PROT 6.1*  ALBUMIN 2.3*   No results for input(s): "LIPASE", "AMYLASE" in the last 168 hours. No results for input(s): "AMMONIA" in the last 168 hours. Coagulation Profile: No results for input(s): "INR", "PROTIME" in the last 168 hours. Cardiac Enzymes: No results for input(s): "CKTOTAL", "CKMB", "CKMBINDEX", "TROPONINI" in the last 168 hours. BNP (last 3 results) No results for input(s): "PROBNP" in the last 8760 hours. HbA1C: No results for input(s): "HGBA1C" in the last 72 hours. CBG: No results for input(s): "GLUCAP" in the last 168 hours. Lipid Profile: No results for input(s): "CHOL", "HDL", "LDLCALC", "TRIG", "CHOLHDL", "LDLDIRECT" in the last 72 hours. Thyroid Function Tests: No results for input(s): "TSH", "T4TOTAL", "FREET4", "T3FREE", "THYROIDAB" in the last 72 hours. Anemia Panel: No results for input(s): "VITAMINB12", "FOLATE", "FERRITIN", "TIBC", "IRON", "RETICCTPCT" in the last 72 hours. Sepsis Labs: Recent Labs  Lab 02/26/23 1959 02/26/23 2118  LATICACIDVEN 1.8 1.7    Recent Results (from the past 240 hours)  Blood culture (routine x 2)     Status: None   Collection Time: 02/26/23  7:59 PM   Specimen: Porta Cath; Blood  Result Value Ref Range Status   Specimen Description PORTA CATH BOTTLES DRAWN AEROBIC AND ANAEROBIC  Final   Special Requests Blood Culture adequate volume  Final  Culture   Final    NO GROWTH 5 DAYS Performed at [facility], [address], [place], [place] [address]    Report Status 03/03/2023 FINAL  Final  Blood culture (routine x 2)     Status: None   Collection Time: 02/26/23  9:18 PM   Specimen: BLOOD LEFT ARM  Result Value Ref Range Status   Specimen Description BLOOD LEFT ARM BOTTLES DRAWN AEROBIC AND ANAEROBIC  Final   Special Requests Blood Culture adequate volume   Final   Culture   Final    NO GROWTH 5 DAYS Performed at [facility], [address], [place], [place] [address]    Report Status 03/03/2023 FINAL  Final         Radiology Studies: No results found.      Scheduled Meds:  Chlorhexidine Gluconate Cloth  6 each Topical Daily   cyanocobalamin  1,000 mcg Oral Daily   dexamethasone  4 mg Oral Q12H   enoxaparin (LOVENOX) injection  40 mg Subcutaneous Q24H   nystatin   Topical TID   oxyCODONE  10 mg Oral Q12H   oxyCODONE  40 mg Oral Q12H   senna-docusate  1 tablet Oral BID   sodium chloride flush  3 mL Intravenous Q12H   tamsulosin  0.4 mg Oral QPC supper     LOS: 3 days    Time spent: 35 minutes    Lurene Robley Hoover Brunette, DO [facility]  If 7PM-7AM, please contact night-coverage [email or web address] 03/05/2023, 4:07 PM

## 2023-03-05 NOTE — Progress Notes (Signed)
Patient has had an uneventful night but has had severe pain all night.  Patient has c/o severe pain in bilateral legs, and medicated per [facility] with short time relief.  No other issues from patient.

## 2023-03-05 NOTE — Progress Notes (Addendum)
Mobility Specialist Progress Note:    03/05/23 1145  Mobility  Activity Transferred to/from [facility];Transferred from bed to chair;Stood at bedside  Level of Assistance Maximum assist, patient does 25-49%  Location manager Ambulated (ft) 4 ft  Range of Motion/Exercises Active;All extremities  Activity Response Tolerated well  Mobility Referral Yes  Mobility visit 1 Mobility  Mobility Specialist Start Time (ACUTE ONLY) 1115  Mobility Specialist Stop Time (ACUTE ONLY) 1145  Mobility Specialist Time Calculation (min) (ACUTE ONLY) 30 min   Pt received in bed, agreeable to mobility. Required MaxA to stand and transfer with RW. Tolerated well, c/o LE pain. Left pt in chair, alarm on. Call bell in hand, all needs met.   Lawerance Bach Mobility Specialist Please contact via Special educational needs teacher or  Rehab office at [phone]

## 2023-03-05 NOTE — Plan of Care (Signed)

## 2023-03-06 ENCOUNTER — Encounter: Payer: Self-pay | Admitting: Physician Assistant

## 2023-03-06 DIAGNOSIS — C799 Secondary malignant neoplasm of unspecified site: Secondary | ICD-10-CM | POA: Diagnosis not present

## 2023-03-06 DIAGNOSIS — R531 Weakness: Secondary | ICD-10-CM | POA: Diagnosis not present

## 2023-03-06 DIAGNOSIS — C7951 Secondary malignant neoplasm of bone: Secondary | ICD-10-CM | POA: Diagnosis not present

## 2023-03-06 DIAGNOSIS — I10 Essential (primary) hypertension: Secondary | ICD-10-CM | POA: Diagnosis not present

## 2023-03-06 DIAGNOSIS — D75839 Thrombocytosis, unspecified: Secondary | ICD-10-CM | POA: Diagnosis not present

## 2023-03-06 DIAGNOSIS — Z466 Encounter for fitting and adjustment of urinary device: Secondary | ICD-10-CM | POA: Diagnosis not present

## 2023-03-06 DIAGNOSIS — N3289 Other specified disorders of bladder: Secondary | ICD-10-CM | POA: Diagnosis not present

## 2023-03-06 DIAGNOSIS — Z515 Encounter for palliative care: Secondary | ICD-10-CM | POA: Diagnosis not present

## 2023-03-06 DIAGNOSIS — E785 Hyperlipidemia, unspecified: Secondary | ICD-10-CM | POA: Diagnosis not present

## 2023-03-06 DIAGNOSIS — Z960 Presence of urogenital implants: Secondary | ICD-10-CM | POA: Diagnosis not present

## 2023-03-06 DIAGNOSIS — M25561 Pain in right knee: Secondary | ICD-10-CM | POA: Diagnosis not present

## 2023-03-06 DIAGNOSIS — Z7189 Other specified counseling: Secondary | ICD-10-CM | POA: Diagnosis not present

## 2023-03-06 DIAGNOSIS — L89152 Pressure ulcer of sacral region, stage 2: Secondary | ICD-10-CM | POA: Diagnosis not present

## 2023-03-06 DIAGNOSIS — Z978 Presence of other specified devices: Secondary | ICD-10-CM | POA: Diagnosis not present

## 2023-03-06 DIAGNOSIS — N3001 Acute cystitis with hematuria: Secondary | ICD-10-CM | POA: Diagnosis not present

## 2023-03-06 DIAGNOSIS — E119 Type 2 diabetes mellitus without complications: Secondary | ICD-10-CM | POA: Diagnosis not present

## 2023-03-06 DIAGNOSIS — M6281 Muscle weakness (generalized): Secondary | ICD-10-CM | POA: Diagnosis not present

## 2023-03-06 DIAGNOSIS — D509 Iron deficiency anemia, unspecified: Secondary | ICD-10-CM | POA: Diagnosis not present

## 2023-03-06 DIAGNOSIS — R530 Neoplastic (malignant) related fatigue: Secondary | ICD-10-CM | POA: Diagnosis not present

## 2023-03-06 DIAGNOSIS — R339 Retention of urine, unspecified: Secondary | ICD-10-CM | POA: Diagnosis not present

## 2023-03-06 DIAGNOSIS — G893 Neoplasm related pain (acute) (chronic): Secondary | ICD-10-CM | POA: Diagnosis not present

## 2023-03-06 DIAGNOSIS — R630 Anorexia: Secondary | ICD-10-CM | POA: Diagnosis not present

## 2023-03-06 DIAGNOSIS — R279 Unspecified lack of coordination: Secondary | ICD-10-CM | POA: Diagnosis not present

## 2023-03-06 DIAGNOSIS — E43 Unspecified severe protein-calorie malnutrition: Secondary | ICD-10-CM | POA: Diagnosis not present

## 2023-03-06 DIAGNOSIS — R278 Other lack of coordination: Secondary | ICD-10-CM | POA: Diagnosis not present

## 2023-03-06 DIAGNOSIS — R1310 Dysphagia, unspecified: Secondary | ICD-10-CM | POA: Diagnosis not present

## 2023-03-06 DIAGNOSIS — C20 Malignant neoplasm of rectum: Secondary | ICD-10-CM | POA: Diagnosis not present

## 2023-03-06 DIAGNOSIS — T839XXA Unspecified complication of genitourinary prosthetic device, implant and graft, initial encounter: Secondary | ICD-10-CM | POA: Diagnosis not present

## 2023-03-06 DIAGNOSIS — M899 Disorder of bone, unspecified: Secondary | ICD-10-CM | POA: Diagnosis not present

## 2023-03-06 DIAGNOSIS — M1990 Unspecified osteoarthritis, unspecified site: Secondary | ICD-10-CM | POA: Diagnosis not present

## 2023-03-06 DIAGNOSIS — R52 Pain, unspecified: Secondary | ICD-10-CM | POA: Diagnosis not present

## 2023-03-06 DIAGNOSIS — Z7401 Bed confinement status: Secondary | ICD-10-CM | POA: Diagnosis not present

## 2023-03-06 DIAGNOSIS — C779 Secondary and unspecified malignant neoplasm of lymph node, unspecified: Secondary | ICD-10-CM | POA: Diagnosis not present

## 2023-03-06 DIAGNOSIS — D500 Iron deficiency anemia secondary to blood loss (chronic): Secondary | ICD-10-CM | POA: Diagnosis not present

## 2023-03-06 DIAGNOSIS — E440 Moderate protein-calorie malnutrition: Secondary | ICD-10-CM | POA: Diagnosis not present

## 2023-03-06 DIAGNOSIS — M25562 Pain in left knee: Secondary | ICD-10-CM | POA: Diagnosis not present

## 2023-03-06 DIAGNOSIS — N390 Urinary tract infection, site not specified: Secondary | ICD-10-CM | POA: Diagnosis not present

## 2023-03-06 DIAGNOSIS — R5381 Other malaise: Secondary | ICD-10-CM | POA: Diagnosis not present

## 2023-03-06 DIAGNOSIS — Z79899 Other long term (current) drug therapy: Secondary | ICD-10-CM | POA: Diagnosis not present

## 2023-03-06 DIAGNOSIS — K5903 Drug induced constipation: Secondary | ICD-10-CM | POA: Diagnosis not present

## 2023-03-06 DIAGNOSIS — R634 Abnormal weight loss: Secondary | ICD-10-CM | POA: Diagnosis not present

## 2023-03-06 MED ORDER — MUSCLE RUB 10-15 % EX CREA: 1.0000 | TOPICAL_CREAM | CUTANEOUS | 0 refills | Status: DC | PRN

## 2023-03-06 MED ORDER — TAMSULOSIN HCL 0.4 MG PO CAPS: 0.4000 mg | ORAL_CAPSULE | Freq: Every day | ORAL | 2 refills | Status: DC

## 2023-03-06 MED ORDER — OXYCODONE HCL ER 10 MG PO T12A: 10.0000 mg | EXTENDED_RELEASE_TABLET | Freq: Two times a day (BID) | ORAL | 0 refills | Status: DC

## 2023-03-06 MED ORDER — OXYCODONE HCL ER 40 MG PO T12A: 40.0000 mg | EXTENDED_RELEASE_TABLET | Freq: Two times a day (BID) | ORAL | 0 refills | Status: DC

## 2023-03-06 MED ORDER — NYSTATIN 100000 UNIT/GM EX POWD: Freq: Three times a day (TID) | CUTANEOUS | 0 refills | Status: DC

## 2023-03-06 MED ORDER — DEXAMETHASONE 4 MG PO TABS: 4.0000 mg | ORAL_TABLET | Freq: Two times a day (BID) | ORAL | 0 refills | Status: DC

## 2023-03-06 MED ORDER — OXYCODONE HCL 10 MG PO TABS: 10.0000 mg | ORAL_TABLET | Freq: Four times a day (QID) | ORAL | 0 refills | Status: DC | PRN

## 2023-03-06 NOTE — Progress Notes (Signed)
Physical Therapy Treatment Patient Details Name: Joseph Hogan MRN: [id number] DOB: September 28, 1960 Today's Date: 03/06/2023   History of Present Illness Joseph Hogan is a 63 y.o. male with hx of metastatic rectal cancer, diffuse bony mets, on chemotherapy with FOLFIRI/panitumumab, recently completed palliative radiation treatment, diabetes, hypertension, hyperlipidemia, who was brought in by EMS from home due to left leg pain and worsening weakness.  He reports ongoing cancer related pain which is not controlled with his home medications.  He has been taking his home long-acting oxycodone ER however not using his IR medications due to concern this is resulting in side effects including more somnolence and intermittent hallucinations.  He has been having worsening left leg pain ongoing for the past few weeks.  Reports the pain radiates down posterior thigh on the left.  There is swelling in both lower extremities which waxes and wanes.  Currently asymmetric on the left.  No history of VTE in the past.  Typically ambulates with the use of the walker but has not been able to get up as much due to pain.  Denies any numbness or tingling.  Does feel weak in both legs.  Otherwise denies any recent cough/cold, shortness of breath, chest pain.  Does report sometimes having issues with swallowing but takes in a regular diet.    PT Comments  Following multiple attempts (nausea, tired, visitors, PA in room, eating...) therapist able to work with pt.  Pt supine in bed and willing to participate.  Pt limited by weakness required increased time and cueing for mechanics with bed mobility, hand and LE placements with transfers and gait training.  Pt with need for restroom break, sidestep to [facility] then sidestep front of bed to chair.  Pt limited by fatigue and weakness, required increased time to complete all tasks wit slow labored movements.  EOS pt left in chair with call bell within reach and family present in room.      If  plan is discharge home, recommend the following:     Can travel by private vehicle        Equipment Recommendations       Recommendations for Other Services       Precautions / Restrictions Precautions Precautions: Fall Restrictions Weight Bearing Restrictions Per Provider Order: No     Mobility  Bed Mobility Overal bed mobility: Needs Assistance Bed Mobility: Supine to Sit     Supine to sit: Min assist     General bed mobility comments: increased time, labored movement    Transfers Overall transfer level: Needs assistance   Transfers: Sit to/from Stand, Bed to chair/wheelchair/BSC Sit to Stand: Mod assist           General transfer comment: slow labored movement with cueing for hand placement, RW for stability, limited knee extension Bil knees    Ambulation/Gait Ambulation/Gait assistance: Mod assist, Max assist Gait Distance (Feet): 6 Feet   Gait Pattern/deviations: Decreased step length - right, Decreased step length - left, Decreased stance time - left, Knees buckling, Antalgic Gait velocity: slow     General Gait Details: limited to a few side steps at bedside before having to sit due to knees buckling   Stairs             Wheelchair Mobility     Tilt Bed    Modified Rankin (Stroke Patients Only)       Balance  Cognition Arousal: Alert Behavior During Therapy: WFL for tasks assessed/performed Overall Cognitive Status: Within Functional Limits for tasks assessed                                          Exercises      General Comments        Pertinent Vitals/Pain Pain Assessment Pain Assessment: 0-10 Pain Score: 8  Pain Location: Lt shoulder Pain Descriptors / Indicators: Dull, Grimacing Pain Intervention(s): Limited activity within patient's tolerance, Monitored during session, Repositioned    Home Living                           Prior Function            PT Goals (current goals can now be found in the care plan section)      Frequency           PT Plan      Co-evaluation              AM-PAC PT "6 Clicks" Mobility   Outcome Measure  Help needed turning from your back to your side while in a flat bed without using bedrails?: A Lot Help needed moving from lying on your back to sitting on the side of a flat bed without using bedrails?: A Lot Help needed moving to and from a bed to a chair (including a wheelchair)?: A Lot Help needed standing up from a chair using your arms (e.g., wheelchair or bedside chair)?: A Lot Help needed to walk in hospital room?: A Lot Help needed climbing 3-5 steps with a railing? : Total 6 Click Score: 11    End of Session Equipment Utilized During Treatment: Gait belt Activity Tolerance: Patient tolerated treatment well;Patient limited by fatigue Patient left: in chair;with call bell/phone within reach;with family/visitor present Nurse Communication: Mobility status       Time: 1330-1355 PT Time Calculation (min) (ACUTE ONLY): 25 min  Charges:    $Therapeutic Activity: 23-37 mins PT General Charges $$ ACUTE PT VISIT: 1 Visit                     Becky Sax, LPTA/CLT; CBIS [phone]  Juel Burrow 03/06/2023, 2:03 PM

## 2023-03-06 NOTE — Progress Notes (Signed)
Patient rested well this shift.  Patient was able to sleep.  Vitals were stable. No acute events overnight.

## 2023-03-06 NOTE — TOC Transition Note (Signed)
Transition of Care [facility]) - Discharge Note   Patient Details  Name: Joseph Hogan MRN: [id number] Date of Birth: 05/15/1960  Transition of Care [facility]) CM/SW Contact:  Elliot Gault, LCSW Phone Number: 03/06/2023, 2:09 PM   Clinical Narrative:     Per MD and Palliative APNP, pt has elected to transfer to [facility] without the radiation treatment. He plans to follow up with oncology as an outpatient to learn if he is a candidate for any other treatments.   Updated Jill Side at [facility] and they can admit pt today.   DC clinical sent electronically. RN to call report. [facility] transport arranged.  Updated pt's daughter by phone. No other TOC needs for dc.  Final next level of care: Skilled Nursing Facility Barriers to Discharge: Barriers Resolved   Patient Goals and CMS Choice Patient states their goals for this hospitalization and ongoing recovery are:: return home CMS Medicare.gov Compare Post Acute Care list provided to:: Patient Choice offered to / list presented to : Patient [place] ownership interest in [facility].provided to:: Patient    Discharge Placement              Patient chooses bed at: [facility] Patient to be transferred to facility by: [facility] Name of family member notified: Sherron Flemings Patient and family notified of of transfer: 03/06/23  Discharge Plan and Services Additional resources added to the After Visit Summary for   In-house Referral: Clinical Social Work   Post Acute Care Choice: Home Health                    HH Arranged: RN, PT [facility] Agency: [facility] Date [facility] Agency Contacted: 02/27/23 Time HH Agency Contacted: [id number] Representative spoke with at [facility] Agency: Kandee Keen  Social Drivers of Health (SDOH) Interventions SDOH Screenings   Food Insecurity: No Food Insecurity (02/27/2023)  Housing: Low Risk  (02/27/2023)  Transportation Needs: No Transportation Needs (02/27/2023)  Utilities: Not At Risk (02/27/2023)   Tobacco Use: Low Risk  (02/27/2023)     Readmission Risk Interventions    10/17/2022   12:12 PM  Readmission Risk Prevention Plan  Transportation Screening Complete  Medication Review (RN Care Manager) Complete  PCP or Specialist appointment within 3-5 days of discharge Complete  HRI or Home Care Consult Complete  SW Recovery Care/Counseling Consult Complete  Palliative Care Screening Not Applicable  Skilled Nursing Facility Not Applicable

## 2023-03-06 NOTE — Progress Notes (Signed)
Attempted to call [facility] to give report. Left voicemail to receive call back. Will attempt to call again.

## 2023-03-06 NOTE — Progress Notes (Signed)
Palliative:   Mr. Gust, Salz, is lying quietly in bed.  He appears acutely/chronically ill and very frail.  He greets me, making and mostly keeping eye contact.  He is alert and oriented, able to make his needs known.  There is no family at bedside at this time.  Bram asks if I can return at a later time because he is trying to rest.  Conference with attending, physical therapy assistant, bedside nursing staff, transition of care team related to patient condition, needs, goals of care, disposition.  I returned later in that day to speak with Theron Arista.  He again appears acutely/chronically ill and very frail.  He will greet me, making and keeping eye contact.  He continues to be alert and oriented, able to make his needs known.  Two long-term family friends have come to visit.   Theron Arista and I have a private conference about his goals of care.  Gean attempts to call his daughter, Claris Che, but there is no answer and he is unable to leave voicemail message.  We talk about short-term rehab versus home.  Abenezer's goal is to continue to take chemotherapy/radiation therapy as offered.  At this point, he elects short-term rehab at [facility] in order to keep up/regain strength for cancer treatments.  [place] asks appropriate questions.  I share that he will be transported via ambulance.  We talk about his ability and desire for rehab, if things change for him, the social worker at [facility] will care for him at home.  Call to daughter, Claris Che.  No answer, unable to leave voicemail message.  Conference with attending, bedside nursing staff, physical therapy assistant transition of care team related to patient condition, needs, goals of care, disposition.  Plan: At this point continue full scope/full code.  Short-term rehab at [facility] rehab with a goal of regaining strength for chemo/radiation treatments.  Plan to follow-up with trusted oncologist, Dr. Leonides Schanz, Monday, January 27.  65 minutes, extended time Lillia Carmel,  NP Palliative Medicine Team  Team Phone [phone]

## 2023-03-06 NOTE — Discharge Summary (Signed)
Physician Discharge Summary  Joseph Hogan XBM:[id number] DOB: 1960/07/02 DOA: 02/26/2023  PCP: [name], MD  Admit date: 02/26/2023  Discharge date: 03/06/2023  Admitted From:Home  Disposition:  SNF  Recommendations for Outpatient Follow-up:  Follow up with Dr. Leonides Schanz on Monday 1/27 regarding further treatment plans Continue pain management along with dexamethasone as prescribed to help with pain control Continue other medications as noted below Continue Foley catheter and follow-up with urology outpatient for voiding trial  Home Health: None  Equipment/Devices: None  Discharge Condition:Stable  CODE STATUS: Full  Diet recommendation: Heart Healthy/carb modified  Brief/Interim Summary: Joseph Hogan is a 63 y.o. male with hx of metastatic rectal cancer, diffuse bony mets, on chemotherapy with FOLFIRI/panitumumab, recently completed palliative radiation treatment, diabetes, hypertension, hyperlipidemia, who was brought in by EMS from home due to left leg pain and worsening weakness.  He reports ongoing cancer related pain which is not controlled with his home medications.  Patient has been admitted for pain management in the setting of metastatic rectal cancer with diffuse bony metastasis.  He was seen by oncology and discussion was had by Dr. Ellin Saba with family members.  It was discussed that there are not any other treatment options left at this time and that his condition was incurable.   He was then seen by palliative care over the course of several days and now appears to have adequate pain control.  He is in stable condition for discharge to SNF as recommended by PT.  He will have further discussion with his oncologist on 1/27 regarding future treatment plans and will decide on whether or not he wants to continue to pursue radiation treatments.  He was also noted to have some AKI and urinary retention and therefore a Foley catheter was placed.  No other acute events or  concerns noted.  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  Discharge Instructions  Discharge Instructions     Ambulatory referral to Urology   Complete by: As directed    Diet - low sodium heart healthy   Complete by: As directed    Discharge wound care:   Complete by: As directed    Daily      Comments: Cleanse sacral wound with VASHE wound cleanser  ([facility]WSON # [id number])  Apply VASHE moist gauze and top with foam   Change daily.   Increase activity slowly   Complete by: As directed       Allergies as of 03/06/2023       Reactions   Codeine Nausea And Vomiting   Dilaudid [hydromorphone Hcl] Nausea And Vomiting   Reaction only present for IM administrations. IV and PO admins are effective.    Lisinopril Cough        Medication List     STOP taking these medications    Xtampza ER 36 MG C12a Generic drug: oxyCODONE ER Replaced by: oxyCODONE 40 mg 12 hr tablet       TAKE these medications    cyanocobalamin 1000 MCG tablet Commonly known as: VITAMIN B12 Take 1 tablet (1,000 mcg total) by mouth daily.   dexamethasone 4 MG tablet Commonly known as: DECADRON Take 1 tablet (4 mg total) by mouth every 12 (twelve) hours.   Muscle Rub 10-15 % Crea Apply 1 Application  topically as needed for muscle pain.   nystatin powder Commonly known as: MYCOSTATIN/NYSTOP Apply topically 3 (three) times daily.   Oxycodone HCl 10 MG Tabs Take 1 tablet (10 mg total) by mouth every 6 (six) hours as needed. What changed: reasons to take this   oxyCODONE 40 mg 12 hr tablet Commonly known as: OXYCONTIN Take 1 tablet (40 mg total) by mouth every 12 (twelve) hours. What changed: You were already taking a medication with the same name, and this prescription  was added. Make sure you understand how and when to take each. Replaces: Xtampza ER 36 MG C12a   oxyCODONE 10 mg 12 hr tablet Commonly known as: OXYCONTIN Take 1 tablet (10 mg total) by mouth every 12 (twelve) hours. What changed: You were already taking a medication with the same name, and this prescription was added. Make sure you understand how and when to take each.   potassium chloride 10 MEQ tablet Commonly known as: KLOR-CON M Take 1 tablet (10 mEq total) by mouth daily.   senna-docusate 8.6-50 MG tablet Commonly known as: Senokot-S Take 1 tablet by mouth 2 (two) times daily.   silver sulfADIAZINE 1 % cream Commonly known as: Silvadene Apply 1 Application topically daily.   tamsulosin 0.4 MG Caps capsule Commonly known as: FLOMAX Take 1 capsule (0.4 mg total) by mouth daily after supper.               Discharge Care Instructions  (From admission, onward)           Start     Ordered   03/06/23 0000  Discharge wound care:       Comments: Daily      Comments: Cleanse sacral wound with VASHE wound cleanser  ([facility] # [id number])  Apply VASHE moist gauze and top with foam   Change daily.   03/06/23 1322            Follow-up Information     [name], MD. Go to.   Specialty: Hematology and Oncology Contact information: [address] [place] [address] [phone]                Allergies  Allergen Reactions   Codeine Nausea And Vomiting   Dilaudid [Hydromorphone Hcl] Nausea And Vomiting    Reaction only present for IM administrations. IV and PO admins are effective.    Lisinopril Cough    Consultations: Oncology Palliative care   Procedures/Studies: DG Tibia/Fibula Left Result Date: 02/27/2023 CLINICAL DATA:  63 year old male with history of osseous metastatic disease. EXAM: LEFT TIBIA AND FIBULA - 2 VIEW COMPARISON:  Left knee radiographs dated June 01, 2015. FINDINGS: There is a permeative ill-defined expansile  destructive lesion in the proximal tibial diaphysis with aggressive appearing periosteal reaction and cortical breakthrough medially measuring approximately 11.4 x 4.2 cm, concerning for osseous metastatic disease. Moderate-to-severe degenerative changes of the left knee. Tibiotalar joint space narrowing. Calcaneal enthesopathy at the insertion of the Achilles tendon and the origin of the central cord of the plantar fascia. IMPRESSION: 1. Expansile permeative ill-defined lesion in the left proximal tibial diaphysis is concerning for osseous metastatic disease. 2. 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 Electronically Signed   By: Hart Robinsons M.D.   On: 02/27/2023 11:37   US Venous Img Lower Unilateral Left (DVT) Result Date: 02/27/2023 CLINICAL DATA:  62 year old male with a history of edema EXAM: LEFT LOWER EXTREMITY VENOUS DOPPLER ULTRASOUND TECHNIQUE: Gray-scale sonography with graded compression,  as well as color Doppler and duplex ultrasound were performed to evaluate the lower extremity deep venous systems from the level of the common femoral vein and including the common femoral, femoral, profunda femoral, popliteal and calf veins including the posterior tibial, peroneal and gastrocnemius veins when visible. The superficial great saphenous vein was also interrogated. Spectral Doppler was utilized to evaluate flow at rest and with distal augmentation maneuvers in the common femoral, femoral and popliteal veins. COMPARISON:  None Available. FINDINGS: Contralateral Common Femoral Vein: Respiratory phasicity is normal and symmetric with the symptomatic side. No evidence of thrombus. Normal compressibility. Common Femoral Vein: No evidence of thrombus. Normal compressibility, and response to augmentation. Blunted respiratory phasicity. Saphenofemoral Junction: No evidence of thrombus. Normal compressibility and flow on color Doppler imaging. Profunda Femoral Vein: No evidence of thrombus.  Normal compressibility and flow on color Doppler imaging. Blunted respiratory phasicity. Femoral Vein: No evidence of thrombus. Normal compressibility, response to augmentation. Blunted respiratory phasicity. Popliteal Vein: No evidence of thrombus. Normal compressibility, response to augmentation. Blunted respiratory phasicity. Calf Veins: No evidence of thrombus. Normal compressibility and flow on color Doppler imaging. Superficial Great Saphenous Vein: No evidence of thrombus. Normal compressibility and flow on color Doppler imaging. Other Findings:  Edema of the lower extremity. Irregular appearance of the cortex of the underlying bone of the lower left leg. Given findings on recent CT, this may represent additional metastatic disease. IMPRESSION: Directed duplex of the left lower extremity negative for DVT. Ultrasound demonstrates bony changes of the left lower leg, potentially additional sites of metastatic disease and further evaluation with plain film imaging may be useful. Blunted respiratory phasicity of the left-sided venous system likely secondary to venous compression that is observed on recent CT scan. These results were discussed by telephone at the time of interpretation on 02/27/2023 at 10:14 am with Dr. Gwenlyn Perking. Signed, Yvone Neu. Miachel Roux, RPVI [facility] Electronically Signed   By: Gilmer Mor D.O.   On: 02/27/2023 10:15   CT L-SPINE NO CHARGE Result Date: 02/26/2023 CLINICAL DATA:  Rectal cancer.  Back pain. EXAM: CT LUMBAR SPINE WITHOUT CONTRAST TECHNIQUE: Multidetector CT imaging of the lumbar spine was performed without intravenous contrast administration. Multiplanar CT image reconstructions were also generated. RADIATION DOSE REDUCTION: This exam was performed according to the departmental dose-optimization program which includes automated exposure control, adjustment of the mA and/or kV according to patient size and/or  use of iterative reconstruction technique. COMPARISON:  CT lumbar spine 10/14/2022 FINDINGS: Segmentation: 5 lumbar type vertebrae. Alignment: Normal. Vertebrae: Multilevel mild degenerative changes of the spine. Posterior disc osteophyte complex formation at the L5-S1 level. Scattered axial and appendicular mix lytic and sclerotic metastasis static lesions. No acute fracture. Paraspinal and other soft tissues: Negative. Disc levels: Maintained. IMPRESSION: 1. Scattered axial and appendicular mix lytic and sclerotic metastasis lesions. 2. No acute displaced fracture or traumatic listhesis of the lumbar spine. 3. Please see separately dictated CT chest, abdomen, pelvis 10/14/2022. Electronically Signed   By: Tish Frederickson M.D.   On: 02/26/2023 22:49   CT Angio Chest PE W and/or Wo Contrast Addendum Date: 02/26/2023 ADDENDUM REPORT: 02/26/2023 22:46 ADDENDUM: The proctitis and cystitis may be due to radiation changes; however, infection is not excluded. Electronically Signed   By: Tish Frederickson M.D.   On: 02/26/2023 22:46   Result Date: 02/26/2023 CLINICAL DATA:  Abdominal pain, acute, nonlocalized; Pulmonary embolism (PE) suspected, high prob sick call. Pt c/o dehydration, leg, back and  neck pain. EXAM: CT ANGIOGRAPHY CHEST CT ABDOMEN AND PELVIS WITH CONTRAST TECHNIQUE: Multidetector CT imaging of the chest was performed using the standard protocol during bolus administration of intravenous contrast. Multiplanar CT image reconstructions and MIPs were obtained to evaluate the vascular anatomy. Multidetector CT imaging of the abdomen and pelvis was performed using the standard protocol during bolus administration of intravenous contrast. RADIATION DOSE REDUCTION: This exam was performed according to the departmental dose-optimization program which includes automated exposure control, adjustment of the mA and/or kV according to patient size and/or use of iterative reconstruction technique. CONTRAST:   OMNIPAQUE IOHEXOL 350 MG/ML SOLN COMPARISON:  CT chest, abdomen, pelvis 10/14/2022, PET CT 07/11/2022, CT chest abdomen pelvis 02/05/2022 FINDINGS: CTA CHEST FINDINGS Cardiovascular: Right chest wall Port-A-Cath with tip in the right atrium. Satisfactory opacification of the pulmonary arteries to the segmental level. No evidence of pulmonary embolism. Normal heart size. No significant pericardial effusion. The thoracic aorta is normal in caliber. No atherosclerotic plaque of the thoracic aorta. No coronary artery calcifications. Mediastinum/Nodes: Interval development of bilateral hilar and mediastinal lymphadenopathy. No enlarged axillary lymph nodes. Thyroid gland, trachea, and esophagus demonstrate no significant findings. Lungs/Pleura: Emphysematous changes. Question development of tree-in-bud nodularity is within the inferior peripheral aspect of the right upper lobe. Stable right lower lobe subpleural calcified nodule. No pulmonary mass. No pleural effusion. No pneumothorax. Musculoskeletal: No chest wall abnormality. Scattered axial and appendicular mix lytic and sclerotic metastasis static lesions. Interval increase in size of multiple expansile lytic rib lesions with as an example a 4.9 x 3.5 cm (from 3.9 x 2.1 cm). Interval development of a T4 sclerotic vertebral body lesion. Partially visualized right humeral surgical hardware. No definite acute displaced a pathologic fracture. Review of the MIP images confirms the above findings. CT ABDOMEN and PELVIS FINDINGS Hepatobiliary: No focal liver abnormality. No gallstones, gallbladder wall thickening, or pericholecystic fluid. No biliary dilatation. Pancreas: Diffusely atrophic. No focal lesion. Otherwise normal pancreatic contour. No surrounding inflammatory changes. No main pancreatic ductal dilatation. Spleen: Normal in size without focal abnormality. Adrenals/Urinary Tract: No adrenal nodule bilaterally. Bilateral kidneys enhance symmetrically. Fluid  density lesions likely represent simple renal cysts. Simple renal cysts, in the absence of clinically indicated signs/symptoms, require no independent follow-up. No hydronephrosis. No hydroureter. The urinary bladder is distended with urine. Circumferential urinary bladder wall thickening. Stomach/Bowel: Stomach is within normal limits. No evidence of small bowel wall thickening or dilatation. Interval development of right tool wall thickening and mucosal hyperemia with perirectal fat stranding. Appendix appears normal. Vascular/Lymphatic: No abdominal aorta or iliac aneurysm. Mild atherosclerotic plaque of the aorta and its branches. No abdominal, pelvic, or inguinal lymphadenopathy. Reproductive: Prostate is unremarkable. Other: No intraperitoneal free fluid. No intraperitoneal free gas. No organized fluid collection. Musculoskeletal: No abdominal wall hernia or abnormality. Stable to slightly increased in size expansile pedunculated lesion arising from the left inferior pubic rami. Scattered axial and appendicular mix lytic and sclerotic metastasis static lesions. Total left hip arthroplasty. No definite pathologic fracture. Please see separately dictated CT lumbar spine 02/26/2023. Review of the MIP images confirms the above findings. IMPRESSION: 1. No pulmonary embolus. 2. Question development of tree-in-bud nodularity is within the inferior peripheral aspect of the right upper lobe. Finding could represent developing infection. 3. Interval development of bilateral hilar and mediastinal lymphadenopathy. 4. Interval development of proctitis. Recommend colonoscopy status post treatment and status post complete resolution of inflammatory changes to exclude an underlying lesion. 5. Interval development of circumferential urinary bladder wall thickening. Correlate  with urinalysis for infection. 6. Scattered axial and appendicular mix lytic and sclerotic metastasis static lesions. table to slightly increased in size  expansile pedunculated lesion arising from the left inferior pubic rami. Interval increase in size of multiple expansile lytic rib lesions with as an example a 4.9 x 3.5 cm (from 3.9 x 2.1 cm). Interval development of a T4 sclerotic vertebral body lesion. 7. No definite pathologic fracture. Electronically Signed: By: Tish Frederickson M.D. On: 02/26/2023 22:42   CT ABDOMEN PELVIS W CONTRAST Addendum Date: 02/26/2023 ADDENDUM REPORT: 02/26/2023 22:46 ADDENDUM: The proctitis and cystitis may be due to radiation changes; however, infection is not excluded. Electronically Signed   By: Tish Frederickson M.D.   On: 02/26/2023 22:46   Result Date: 02/26/2023 CLINICAL DATA:  Abdominal pain, acute, nonlocalized; Pulmonary embolism (PE) suspected, high prob sick call. Pt c/o dehydration, leg, back and neck pain. EXAM: CT ANGIOGRAPHY CHEST CT ABDOMEN AND PELVIS WITH CONTRAST TECHNIQUE: Multidetector CT imaging of the chest was performed using the standard protocol during bolus administration of intravenous contrast. Multiplanar CT image reconstructions and MIPs were obtained to evaluate the vascular anatomy. Multidetector CT imaging of the abdomen and pelvis was performed using the standard protocol during bolus administration of intravenous contrast. RADIATION DOSE REDUCTION: This exam was performed according to the departmental dose-optimization program which includes automated exposure control, adjustment of the mA and/or kV according to patient size and/or use of iterative reconstruction technique. CONTRAST:  OMNIPAQUE IOHEXOL 350 MG/ML SOLN COMPARISON:  CT chest, abdomen, pelvis 10/14/2022, PET CT 07/11/2022, CT chest abdomen pelvis 02/05/2022 FINDINGS: CTA CHEST FINDINGS Cardiovascular: Right chest wall Port-A-Cath with tip in the right atrium. Satisfactory opacification of the pulmonary arteries to the segmental level. No evidence of pulmonary embolism. Normal heart size. No significant pericardial effusion. The  thoracic aorta is normal in caliber. No atherosclerotic plaque of the thoracic aorta. No coronary artery calcifications. Mediastinum/Nodes: Interval development of bilateral hilar and mediastinal lymphadenopathy. No enlarged axillary lymph nodes. Thyroid gland, trachea, and esophagus demonstrate no significant findings. Lungs/Pleura: Emphysematous changes. Question development of tree-in-bud nodularity is within the inferior peripheral aspect of the right upper lobe. Stable right lower lobe subpleural calcified nodule. No pulmonary mass. No pleural effusion. No pneumothorax. Musculoskeletal: No chest wall abnormality. Scattered axial and appendicular mix lytic and sclerotic metastasis static lesions. Interval increase in size of multiple expansile lytic rib lesions with as an example a 4.9 x 3.5 cm (from 3.9 x 2.1 cm). Interval development of a T4 sclerotic vertebral body lesion. Partially visualized right humeral surgical hardware. No definite acute displaced a pathologic fracture. Review of the MIP images confirms the above findings. CT ABDOMEN and PELVIS FINDINGS Hepatobiliary: No focal liver abnormality. No gallstones, gallbladder wall thickening, or pericholecystic fluid. No biliary dilatation. Pancreas: Diffusely atrophic. No focal lesion. Otherwise normal pancreatic contour. No surrounding inflammatory changes. No main pancreatic ductal dilatation. Spleen: Normal in size without focal abnormality. Adrenals/Urinary Tract: No adrenal nodule bilaterally. Bilateral kidneys enhance symmetrically. Fluid density lesions likely represent simple renal cysts. Simple renal cysts, in the absence of clinically indicated signs/symptoms, require no independent follow-up. No hydronephrosis. No hydroureter. The urinary bladder is distended with urine. Circumferential urinary bladder wall thickening. Stomach/Bowel: Stomach is within normal limits. No evidence of small bowel wall thickening or dilatation. Interval development  of right tool wall thickening and mucosal hyperemia with perirectal fat stranding. Appendix appears normal. Vascular/Lymphatic: No abdominal aorta or iliac aneurysm. Mild atherosclerotic plaque of the aorta and its branches.  No abdominal, pelvic, or inguinal lymphadenopathy. Reproductive: Prostate is unremarkable. Other: No intraperitoneal free fluid. No intraperitoneal free gas. No organized fluid collection. Musculoskeletal: No abdominal wall hernia or abnormality. Stable to slightly increased in size expansile pedunculated lesion arising from the left inferior pubic rami. Scattered axial and appendicular mix lytic and sclerotic metastasis static lesions. Total left hip arthroplasty. No definite pathologic fracture. Please see separately dictated CT lumbar spine 02/26/2023. Review of the MIP images confirms the above findings. IMPRESSION: 1. No pulmonary embolus. 2. Question development of tree-in-bud nodularity is within the inferior peripheral aspect of the right upper lobe. Finding could represent developing infection. 3. Interval development of bilateral hilar and mediastinal lymphadenopathy. 4. Interval development of proctitis. Recommend colonoscopy status post treatment and status post complete resolution of inflammatory changes to exclude an underlying lesion. 5. Interval development of circumferential urinary bladder wall thickening. Correlate with urinalysis for infection. 6. Scattered axial and appendicular mix lytic and sclerotic metastasis static lesions. table to slightly increased in size expansile pedunculated lesion arising from the left inferior pubic rami. Interval increase in size of multiple expansile lytic rib lesions with as an example a 4.9 x 3.5 cm (from 3.9 x 2.1 cm). Interval development of a T4 sclerotic vertebral body lesion. 7. No definite pathologic fracture. Electronically Signed: By: Tish Frederickson M.D. On: 02/26/2023 22:42     Discharge Exam: Vitals:   03/05/23 2000  03/06/23 0600  BP: 137/81 136/75  Pulse: 82 80  Resp: 18 16  Temp: 98.2 F (36.8 C) 98.2 F (36.8 C)  SpO2: 100% 100%   Vitals:   03/05/23 0400 03/05/23 1449 03/05/23 2000 03/06/23 0600  BP: (!) 144/79 (!) 141/88 137/81 136/75  Pulse: 64 80 82 80  Resp: 18 18 18 16   Temp: 97.9 F (36.6 C) 98 F (36.7 C) 98.2 F (36.8 C) 98.2 F (36.8 C)  TempSrc:  Oral    SpO2: 98% 100% 100% 100%  Weight:      Height:        General: Pt is alert, awake, not in acute distress Cardiovascular: RRR, S1/S2 +, no rubs, no gallops Respiratory: CTA bilaterally, no wheezing, no rhonchi Abdominal: Soft, NT, ND, bowel sounds + Extremities: no edema, no cyanosis    The results of significant diagnostics from this hospitalization (including imaging, microbiology, ancillary and laboratory) are listed below for reference.     Microbiology: Recent Results (from the past 240 hours)  Blood culture (routine x 2)     Status: None   Collection Time: 02/26/23  7:59 PM   Specimen: Porta Cath; Blood  Result Value Ref Range Status   Specimen Description PORTA CATH BOTTLES DRAWN AEROBIC AND ANAEROBIC  Final   Special Requests Blood Culture adequate volume  Final   Culture   Final    NO GROWTH 5 DAYS Performed at [facility], [address] [place], [place] [address]    Report Status 03/03/2023 FINAL  Final  Blood culture (routine x 2)     Status: None   Collection Time: 02/26/23  9:18 PM   Specimen: BLOOD LEFT ARM  Result Value Ref Range Status   Specimen Description BLOOD LEFT ARM BOTTLES DRAWN AEROBIC AND ANAEROBIC  Final   Special Requests Blood Culture adequate volume  Final   Culture   Final    NO GROWTH 5 DAYS Performed at [facility], [address], [place], [place] [address]    Report Status 03/03/2023 FINAL  Final  Labs: BNP (last 3 results) No results for input(s): "BNP" in the last 8760 hours. Basic Metabolic Panel: Recent Labs  Lab 03/01/23 0421  NA 138  K 4.0  CL 102   CO2 26  GLUCOSE 119*  BUN 14  CREATININE 0.64  CALCIUM 9.1   Liver Function Tests: No results for input(s): "AST", "ALT", "ALKPHOS", "BILITOT", "PROT", "ALBUMIN" in the last 168 hours. No results for input(s): "LIPASE", "AMYLASE" in the last 168 hours. No results for input(s): "AMMONIA" in the last 168 hours. CBC: Recent Labs  Lab 03/01/23 0421  WBC 9.4  HGB 7.6*  HCT 25.0*  MCV 86.5  PLT 358   Cardiac Enzymes: No results for input(s): "CKTOTAL", "CKMB", "CKMBINDEX", "TROPONINI" in the last 168 hours. BNP: Invalid input(s): "POCBNP" CBG: No results for input(s): "GLUCAP" in the last 168 hours. D-Dimer No results for input(s): "DDIMER" in the last 72 hours. Hgb A1c No results for input(s): "HGBA1C" in the last 72 hours. Lipid Profile No results for input(s): "CHOL", "HDL", "LDLCALC", "TRIG", "CHOLHDL", "LDLDIRECT" in the last 72 hours. Thyroid function studies No results for input(s): "TSH", "T4TOTAL", "T3FREE", "THYROIDAB" in the last 72 hours.  Invalid input(s): "FREET3" Anemia work up No results for input(s): "VITAMINB12", "FOLATE", "FERRITIN", "TIBC", "IRON", "RETICCTPCT" in the last 72 hours. Urinalysis    Component Value Date/Time   COLORURINE YELLOW 02/26/2023 1959   APPEARANCEUR HAZY (A) 02/26/2023 1959   LABSPEC 1.016 02/26/2023 1959   PHURINE 5.0 02/26/2023 1959   GLUCOSEU NEGATIVE 02/26/2023 1959   HGBUR NEGATIVE 02/26/2023 1959   BILIRUBINUR NEGATIVE 02/26/2023 1959   BILIRUBINUR SMALL 02/13/2016 1437   KETONESUR NEGATIVE 02/26/2023 1959   PROTEINUR NEGATIVE 02/26/2023 1959   UROBILINOGEN 0.2 02/13/2016 1437   NITRITE NEGATIVE 02/26/2023 1959   LEUKOCYTESUR NEGATIVE 02/26/2023 1959   Sepsis Labs Recent Labs  Lab 03/01/23 0421  WBC 9.4   Microbiology Recent Results (from the past 240 hours)  Blood culture (routine x 2)     Status: None   Collection Time: 02/26/23  7:59 PM   Specimen: Porta Cath; Blood  Result Value Ref Range Status    Specimen Description PORTA CATH BOTTLES DRAWN AEROBIC AND ANAEROBIC  Final   Special Requests Blood Culture adequate volume  Final   Culture   Final    NO GROWTH 5 DAYS Performed at [facility], [address], [place], [place] [address]    Report Status 03/03/2023 FINAL  Final  Blood culture (routine x 2)     Status: None   Collection Time: 02/26/23  9:18 PM   Specimen: BLOOD LEFT ARM  Result Value Ref Range Status   Specimen Description BLOOD LEFT ARM BOTTLES DRAWN AEROBIC AND ANAEROBIC  Final   Special Requests Blood Culture adequate volume  Final   Culture   Final    NO GROWTH 5 DAYS Performed at [facility], [address] [place], [place] [address]    Report Status 03/03/2023 FINAL  Final     Time coordinating discharge: 35 minutes  SIGNED:   Erick Blinks, DO [facility] 03/06/2023, 1:37 PM  If 7PM-7AM, please contact night-coverage [email or web address]

## 2023-03-07 ENCOUNTER — Other Ambulatory Visit (HOSPITAL_COMMUNITY): Payer: Self-pay

## 2023-03-07 ENCOUNTER — Telehealth (HOSPITAL_COMMUNITY): Payer: Self-pay | Admitting: Pharmacy Technician

## 2023-03-07 DIAGNOSIS — C799 Secondary malignant neoplasm of unspecified site: Secondary | ICD-10-CM | POA: Diagnosis not present

## 2023-03-07 DIAGNOSIS — G893 Neoplasm related pain (acute) (chronic): Secondary | ICD-10-CM | POA: Diagnosis not present

## 2023-03-07 DIAGNOSIS — M899 Disorder of bone, unspecified: Secondary | ICD-10-CM | POA: Diagnosis not present

## 2023-03-07 DIAGNOSIS — M25562 Pain in left knee: Secondary | ICD-10-CM | POA: Diagnosis not present

## 2023-03-07 DIAGNOSIS — C20 Malignant neoplasm of rectum: Secondary | ICD-10-CM | POA: Diagnosis not present

## 2023-03-07 DIAGNOSIS — C7951 Secondary malignant neoplasm of bone: Secondary | ICD-10-CM | POA: Diagnosis not present

## 2023-03-07 DIAGNOSIS — M25561 Pain in right knee: Secondary | ICD-10-CM | POA: Diagnosis not present

## 2023-03-07 NOTE — Telephone Encounter (Signed)
Pharmacy Patient Advocate Encounter  Received notification from [facility] that Prior Authorization for Nystatin 100000 UNIT/GM powder has been APPROVED from 03/07/2023 to 02/10/2024   PA #/Case ID/Reference #: [id number] Key: Letta Kocher

## 2023-03-10 DIAGNOSIS — R531 Weakness: Secondary | ICD-10-CM | POA: Diagnosis not present

## 2023-03-10 DIAGNOSIS — I10 Essential (primary) hypertension: Secondary | ICD-10-CM | POA: Diagnosis not present

## 2023-03-10 DIAGNOSIS — E785 Hyperlipidemia, unspecified: Secondary | ICD-10-CM | POA: Diagnosis not present

## 2023-03-10 DIAGNOSIS — Z960 Presence of urogenital implants: Secondary | ICD-10-CM | POA: Diagnosis not present

## 2023-03-10 DIAGNOSIS — Z466 Encounter for fitting and adjustment of urinary device: Secondary | ICD-10-CM | POA: Diagnosis not present

## 2023-03-10 DIAGNOSIS — C20 Malignant neoplasm of rectum: Secondary | ICD-10-CM | POA: Diagnosis not present

## 2023-03-10 DIAGNOSIS — R52 Pain, unspecified: Secondary | ICD-10-CM | POA: Diagnosis not present

## 2023-03-10 DIAGNOSIS — T839XXA Unspecified complication of genitourinary prosthetic device, implant and graft, initial encounter: Secondary | ICD-10-CM | POA: Diagnosis not present

## 2023-03-10 DIAGNOSIS — Z79899 Other long term (current) drug therapy: Secondary | ICD-10-CM | POA: Diagnosis not present

## 2023-03-10 DIAGNOSIS — N3001 Acute cystitis with hematuria: Secondary | ICD-10-CM | POA: Diagnosis not present

## 2023-03-10 DIAGNOSIS — Z7401 Bed confinement status: Secondary | ICD-10-CM | POA: Diagnosis not present

## 2023-03-10 DIAGNOSIS — R5381 Other malaise: Secondary | ICD-10-CM | POA: Diagnosis not present

## 2023-03-10 DIAGNOSIS — E119 Type 2 diabetes mellitus without complications: Secondary | ICD-10-CM | POA: Diagnosis not present

## 2023-03-10 DIAGNOSIS — C7951 Secondary malignant neoplasm of bone: Secondary | ICD-10-CM | POA: Diagnosis not present

## 2023-03-11 NOTE — Progress Notes (Deleted)
 Palliative Medicine [facility]  [phone] [phone]   Name: [name] Date: 03/11/2023 MRN: [id number]  DOB: February 04, 1961  Patient Care Team: [name], MD as PCP - General (Hematology and Oncology) Doreatha Massed, MD as Medical Oncologist (Medical Oncology) Pickenpack-Cousar, Arty Baumgartner, NP as Nurse Practitioner (Hospice and Palliative Medicine)   INTERVAL HISTORY: TEDDRICK MALLARI is a 63 y.o. 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 Palliative ask to see for symptom management and goals of care.   SOCIAL HISTORY:     reports that he has never smoked. He has never used smokeless tobacco. He reports that he does not drink alcohol and does not use drugs.  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  MEDICATIONS:  Current Outpatient Medications  Medication Sig Dispense Refill   cyanocobalamin (VITAMIN B12) 1000 MCG tablet Take 1 tablet (1,000 mcg total) by mouth daily. 30 tablet 1   dexamethasone (DECADRON) 4 MG tablet Take 1 tablet (4 mg total) by mouth every 12 (twelve) hours. 60 tablet 0   Menthol-Methyl Salicylate (MUSCLE RUB) 10-15 % CREA Apply 1 Application topically as needed for muscle pain. 35 g 0   nystatin (MYCOSTATIN/NYSTOP) powder Apply topically 3 (three) times daily. 15 g 0   oxyCODONE (OXYCONTIN) 10 mg 12 hr tablet Take 1 tablet (10 mg total) by mouth every 12  (twelve) hours. 14 tablet 0   oxyCODONE (OXYCONTIN) 40 mg 12 hr tablet Take 1 tablet (40 mg total) by mouth every 12 (twelve) hours. 10 tablet 0   Oxycodone HCl 10 MG TABS Take 1 tablet (10 mg total) by mouth every 6 (six) hours as needed. 10 tablet 0   potassium chloride (KLOR-CON M) 10 MEQ tablet Take 1 tablet (10 mEq total) by mouth daily. 30 tablet 0   senna-docusate (SENOKOT-S) 8.6-50 MG tablet Take 1 tablet by mouth 2 (two) times daily. 30 tablet 0   silver sulfADIAZINE (SILVADENE) 1 % cream Apply 1 Application topically daily. 50 g 3   tamsulosin (FLOMAX) 0.4 MG CAPS capsule Take 1 capsule (0.4 mg total) by mouth daily after supper. 30 capsule 2   No current facility-administered medications for this visit.    VITAL SIGNS: There were no vitals taken for this visit. There were no vitals filed for this visit.  Estimated body mass index is 24.6 kg/m as calculated from the following:   Height as of 02/26/23: 5\' 11"  (1.803 m).   Weight as of 02/26/23: 176 lb 5.9 oz (80 kg).   PERFORMANCE STATUS (ECOG) : 2 - Symptomatic, <50% confined to bed  Discussed the use of AI scribe software for clinical note transcription with the patient, who gave verbal consent to proceed.   IMPRESSION:  Joseph Hogan presents to clinic for symptom management follow-up. No acute distress. His son is present. Patient is in a wheelchair. Skin continues to be dry. He reports no pain and  describes the sensation in his right hip as an occasional 'tingle.' He has been managing his medications well, with a noted decrease in his supply of a 12-hour medication, which will be refilled. He is much appreciative in his improvement in pain.  [name] has his pain medication bottles with him today. All medications reviewed. He is taking as prescribed. He has his oxycodone 10mg  breakthrough medication which he has used sparingly when needed.  Reports sleeping well, typically from 10pm to around 9 or 10am. He experienced a bout of  diarrhea lasting four to five days, which has since resolved. During this period, he took two painkillers, which seemed to help stop the diarrhea. His appetite has been fluctuating, typically decreasing around the time of his treatments, but it has been improving in the last two days. We discussed the need to increase his appetite. Current weight is 180lbs down from 191lbs on 10/24, 209lbs on 10/16. We will connect him with a dietician for additional support.   We reviewed patient's scheduled for regular treatments and radiation, with a long treatment day including a doctor's appointment coming up. He has a pump that is typically removed on Fridays. Son verbalized understanding of calendar of events.  Goals of Care  11/07/22- We discussed his current illness and what it means in the larger context of his on-going co-morbidities. Natural disease trajectory and expectations were discussed.   Patient's son verbalized understanding of his father's current illness. Patient also able to acknowledge some form of understanding but with some obvious signs of confusion when it comes to memory recall. He mentions his children assist with most of his medical needs.    I empathetically approached discussions regarding healthcare limitations including code status and advanced directives. Son, Toua states they are in the process of completing documents. Joseph Hogan (patient) confirms. They verbalize patient's [name] is primary medical decision maker. They request patient to remain full code with full scope care. [name] and son are clear in expressed wishes to continue to treat the treatable allowing patient every opportunity to continue to do as good as he can for as long as he can while managing his symptoms.     We discussed Her current illness and what it means in the larger context of Her on-going co-morbidities. Natural disease trajectory and expectations were discussed.  I discussed the importance  of continued conversation with family and their medical providers regarding overall plan of care and treatment options, ensuring decisions are within the context of the patients values and GOCs.  PLAN:  Pain Management Pain well controlled with current regimen. Patient has been taking Oxycodone 10mg  as needed for breakthrough pain.  -Continue Oxycodone 10mg  as needed for pain. Marlowe Kays 36mg  every 12 hours.  -His daughter manages weekly pill container   Medication Refill Patient's medication is running low. -Send refill to local pharmacy.  Gastrointestinal Issues Patient experienced diarrhea for several days, which has now resolved. -Monitor symptoms.  Appetite Decreased appetite post-treatment, improving as next treatment approaches. -Encourage patient to continue working on improving appetite. -Weight significantly decreased over the past month.  -Dietician referral   Cancer Treatment Patient is undergoing radiation therapy and has chemotherapy scheduled. -Continue with current treatment plan. -Next chemotherapy session scheduled for 12/18/2022.  Follow-up Patient to return for treatment and doctor's appointment on 12/18/2022. -Will follow-up in clinic on 12/26/2022. Patient expressed understanding and was in agreement with this plan. He also understands that He can call the clinic at any time with  any questions, concerns, or complaints.   Any controlled substances utilized were prescribed in the context of palliative care. PDMP has been reviewed.   Visit consisted of counseling and education dealing with the complex and emotionally intense issues of symptom management and palliative care in the setting of serious and potentially life-threatening illness.  Willette Alma, AGPCNP-BC  Palliative Medicine Team/[facility]  *Please note that this is a verbal dictation therefore any spelling or grammatical errors are due to the "[employer] One" system  interpretation.

## 2023-03-12 DIAGNOSIS — M25561 Pain in right knee: Secondary | ICD-10-CM | POA: Diagnosis not present

## 2023-03-12 DIAGNOSIS — R278 Other lack of coordination: Secondary | ICD-10-CM | POA: Diagnosis not present

## 2023-03-12 DIAGNOSIS — I10 Essential (primary) hypertension: Secondary | ICD-10-CM | POA: Diagnosis not present

## 2023-03-12 DIAGNOSIS — E119 Type 2 diabetes mellitus without complications: Secondary | ICD-10-CM | POA: Diagnosis not present

## 2023-03-12 DIAGNOSIS — M25562 Pain in left knee: Secondary | ICD-10-CM | POA: Diagnosis not present

## 2023-03-12 DIAGNOSIS — M1990 Unspecified osteoarthritis, unspecified site: Secondary | ICD-10-CM | POA: Diagnosis not present

## 2023-03-12 DIAGNOSIS — C799 Secondary malignant neoplasm of unspecified site: Secondary | ICD-10-CM | POA: Diagnosis not present

## 2023-03-12 DIAGNOSIS — C7951 Secondary malignant neoplasm of bone: Secondary | ICD-10-CM | POA: Diagnosis not present

## 2023-03-12 DIAGNOSIS — M899 Disorder of bone, unspecified: Secondary | ICD-10-CM | POA: Diagnosis not present

## 2023-03-12 DIAGNOSIS — C20 Malignant neoplasm of rectum: Secondary | ICD-10-CM | POA: Diagnosis not present

## 2023-03-12 DIAGNOSIS — L89152 Pressure ulcer of sacral region, stage 2: Secondary | ICD-10-CM | POA: Diagnosis not present

## 2023-03-13 ENCOUNTER — Inpatient Hospital Stay: Payer: Medicare HMO

## 2023-03-13 ENCOUNTER — Inpatient Hospital Stay: Payer: Medicare HMO | Admitting: Hematology and Oncology

## 2023-03-13 ENCOUNTER — Other Ambulatory Visit: Payer: Self-pay | Admitting: *Deleted

## 2023-03-13 ENCOUNTER — Encounter: Payer: Self-pay | Admitting: Hematology and Oncology

## 2023-03-13 ENCOUNTER — Encounter: Payer: Self-pay | Admitting: Nurse Practitioner

## 2023-03-13 ENCOUNTER — Inpatient Hospital Stay (HOSPITAL_BASED_OUTPATIENT_CLINIC_OR_DEPARTMENT_OTHER): Payer: Medicare HMO | Admitting: Nurse Practitioner

## 2023-03-13 VITALS — BP 119/78 | HR 109 | Temp 98.7°F | Resp 17

## 2023-03-13 DIAGNOSIS — C20 Malignant neoplasm of rectum: Secondary | ICD-10-CM

## 2023-03-13 DIAGNOSIS — Z515 Encounter for palliative care: Secondary | ICD-10-CM | POA: Diagnosis not present

## 2023-03-13 DIAGNOSIS — C7951 Secondary malignant neoplasm of bone: Secondary | ICD-10-CM

## 2023-03-13 DIAGNOSIS — K5903 Drug induced constipation: Secondary | ICD-10-CM | POA: Diagnosis not present

## 2023-03-13 DIAGNOSIS — D509 Iron deficiency anemia, unspecified: Secondary | ICD-10-CM | POA: Diagnosis not present

## 2023-03-13 DIAGNOSIS — R630 Anorexia: Secondary | ICD-10-CM

## 2023-03-13 DIAGNOSIS — G893 Neoplasm related pain (acute) (chronic): Secondary | ICD-10-CM

## 2023-03-13 DIAGNOSIS — R530 Neoplastic (malignant) related fatigue: Secondary | ICD-10-CM | POA: Diagnosis not present

## 2023-03-13 DIAGNOSIS — D75839 Thrombocytosis, unspecified: Secondary | ICD-10-CM | POA: Diagnosis not present

## 2023-03-13 DIAGNOSIS — Z7189 Other specified counseling: Secondary | ICD-10-CM

## 2023-03-13 DIAGNOSIS — R634 Abnormal weight loss: Secondary | ICD-10-CM

## 2023-03-13 DIAGNOSIS — C779 Secondary and unspecified malignant neoplasm of lymph node, unspecified: Secondary | ICD-10-CM | POA: Diagnosis not present

## 2023-03-13 LAB — CBC WITH DIFFERENTIAL (CANCER CENTER ONLY)
Abs Immature Granulocytes: 0.11 10*3/uL — ABNORMAL HIGH (ref 0.00–0.07)
Basophils Absolute: 0 10*3/uL (ref 0.0–0.1)
Basophils Relative: 0 %
Eosinophils Absolute: 0 10*3/uL (ref 0.0–0.5)
Eosinophils Relative: 0 %
HCT: 29 % — ABNORMAL LOW (ref 39.0–52.0)
Hemoglobin: 9.1 g/dL — ABNORMAL LOW (ref 13.0–17.0)
Immature Granulocytes: 1 %
Lymphocytes Relative: 2 %
Lymphs Abs: 0.2 10*3/uL — ABNORMAL LOW (ref 0.7–4.0)
MCH: 26.1 pg (ref 26.0–34.0)
MCHC: 31.4 g/dL (ref 30.0–36.0)
MCV: 83.1 fL (ref 80.0–100.0)
Monocytes Absolute: 0.7 10*3/uL (ref 0.1–1.0)
Monocytes Relative: 5 %
Neutro Abs: 13.4 10*3/uL — ABNORMAL HIGH (ref 1.7–7.7)
Neutrophils Relative %: 92 %
Platelet Count: 630 10*3/uL — ABNORMAL HIGH (ref 150–400)
RBC: 3.49 MIL/uL — ABNORMAL LOW (ref 4.22–5.81)
RDW: 15.9 % — ABNORMAL HIGH (ref 11.5–15.5)
WBC Count: 14.4 10*3/uL — ABNORMAL HIGH (ref 4.0–10.5)
nRBC: 0 % (ref 0.0–0.2)

## 2023-03-13 LAB — CMP (CANCER CENTER ONLY)
ALT: 10 U/L (ref 0–44)
AST: 32 U/L (ref 15–41)
Albumin: 2.8 g/dL — ABNORMAL LOW (ref 3.5–5.0)
Alkaline Phosphatase: 349 U/L — ABNORMAL HIGH (ref 38–126)
Anion gap: 9 (ref 5–15)
BUN: 16 mg/dL (ref 8–23)
CO2: 26 mmol/L (ref 22–32)
Calcium: 8.5 mg/dL — ABNORMAL LOW (ref 8.9–10.3)
Chloride: 99 mmol/L (ref 98–111)
Creatinine: 0.56 mg/dL — ABNORMAL LOW (ref 0.61–1.24)
GFR, Estimated: >60 mL/min (ref >60)
Glucose, Bld: 162 mg/dL — ABNORMAL HIGH (ref 70–99)
Potassium: 4.3 mmol/L (ref 3.5–5.1)
Sodium: 134 mmol/L — ABNORMAL LOW (ref 135–145)
Total Bilirubin: 0.4 mg/dL (ref 0.0–1.2)
Total Protein: 6.2 g/dL — ABNORMAL LOW (ref 6.5–8.1)

## 2023-03-13 LAB — MAGNESIUM: Magnesium: 1.8 mg/dL (ref 1.7–2.4)

## 2023-03-13 LAB — SAMPLE TO BLOOD BANK

## 2023-03-13 MED ORDER — OXYCODONE HCL 10 MG PO TABS: 10.0000 mg | ORAL_TABLET | ORAL | 0 refills | Status: DC | PRN

## 2023-03-13 NOTE — Patient Instructions (Addendum)
VISIT SUMMARY:  During today's appointment, we discussed your pain management and the transition to hospice care at home upon completion of rehab days. We also reviewed your current medication regimen and addressed your concerns about side effects and your preference for end-of-life care.  YOUR PLAN:  -END OF LIFE CARE: You have a limited prognosis and are considering hospice care at home. Hospice care focuses on comfort and quality of life rather than aggressive treatments. We will initiate hospice care at home and communicate with your son about this transition and plans to ensure his understanding and agreement.   -PAIN MANAGEMENT: You are experiencing pain in your lower extremities and buttocks, currently managed with Oxycodone ER and Oxycodone 10mg . Pain management aims to reduce your discomfort and improve your quality of life. We will change your Oxycodone 10mg  to every 4 hours as needed for pain and continue Oxycodone ER as prescribed. We will also review your medication regimen to ensure optimal pain control. Facility to contact office as needed for adjustments to medications.   INSTRUCTIONS:  Please follow up with [name] next week by phone to assess your pain control and the transition to hospice care at home once discharged from rehab.   We will send referral to [facility] and get you established with their outpatient palliative team allowing for smooth transition when it is time to return home.   If you have any questions or concerns, please do not hesitate to contact myself Lowella Bandy, NP or [place], RN at [phone].

## 2023-03-13 NOTE — Progress Notes (Signed)
[facility] [phone]   [phone]  PROGRESS NOTE  Patient Care Team: [name], MD as PCP - General (Hematology and Oncology) Doreatha Massed, MD as Medical Oncologist (Medical Oncology) Pickenpack-Cousar, Arty Baumgartner, NP as Nurse Practitioner (Hospice and Palliative Medicine)  DIAGNOSIS: Metastatic rectal carcinoma  ONCOLOGIC HISTORY: # Metastatic Rectal Adenocarcinoma 02/03/2022-02/11/2022: Presented to ED due to left hip pain after a mechanical fall.  02/04/2022: CT left NGE:[id number] pathologic fracture of the left femoral neck with displacement and angulation, with underlying lytic lesions in the femoral head and neck.Additional lytic destructive lesion of the left parasymphyseal pubic bone extending throughout the superior pubic ramus to the puboacetabular junction, with pathologic fracture of the superior pubic ramus. Adjacent hypoattenuation in the pelvic component of the obturator internus muscle, suspicious for tumor involvement. Probable small lytic lesion in the inferior pubic ramus.Prominent left external iliac lymph nodes measuring up to 1.2 cm. 02/05/2022: CT CAP: Pathologic retroperitoneal and bilateral pelvic adenopathy, with lytic destructive lesions of the left superior and inferior pubic ramus and pubic body as well as a pathologic fracture through the left femoral neck. Pathologic fracture anteriorly in the right second rib. Deformity anteriorly in the right third rib probably from early pathologic fracture. Metastatic disease or myeloma strongly favored over multifocal osteomyelitis as a cause. Mildly enlarged left supraclavicular lymph node and lower thoracic periaortic lymph node. Pathologic retroperitoneal and pelvic adenopathy. 10 cm long segment of rectal wall thickening.1.1 cm exophytic lesion from the left mid kidney posterolaterally, nonspecific for complex cyst versus tumor. Bandlike atelectasis in both lungs.Small type 1  hiatal hernia.Lumbar spondylosis and degenerative disc disease causing generally mild multilevel impingement. Sigmoid colon diverticulosis.Wall thickening in the proximal stomach body, probably secondary to nondistention. 02/06/2022: Underwent left total hip arthroplasty. Pathology revealed poorly differentiated carcinoma.  SPEP/IFE did not detect monoclonal protein. PSA normal. 02/25/2022: Underwent colonoscopy that showed malignant tumor in the rectum. Pathology confirmed poorly differentiated carcinoma.  04/05/2022: Underwent port placement 04/08/2022: Cycle 1, Day 1 of FOLFOX (held bevacizumab due to recent port placement) 04/23/2022: Cycle 2, Day 1 of FOLFOX plus bevacizumab 05/07/2022: Cycle 3, Day 1 of FOLFOX plus bevacizumab 05/21/2022: Cycle 4, Day 1 of FOLFOX plus bevacizumab. Transitioned care to Dr. Caren Hazy at [facility] 07/02/2022: Cycle 6, Day 1 of FOLFOX plus bevacizumab at [facility].  07/11/2022: NM PET CT scan showed progression of hypermetabolic osseous metastatic disease, although there is some mixed change as detailed. New hypermetabolic right femoral neck metastasis places the patient at risk for pathologic right femoral neck fracture. 07/30/2022:  Cycle 1 Day 1  FOLFIRI +Panitumumab 08/14/2022: Cycle 2 Day 1 FOLFIRI + Panitumumab 08/27/2022: Cycle 3 Day 1 FOLFIRI + Panitumumab 09/11/2022: Cycle 4 Day 1 FOLFIRI (Hold Panitumumab due to acneiform rash) 10/02/2022: Cycle 5 Day 1 FOLFIRI (Hold Panitumumab due to acneiform rash) 11/07/2022: Cycle 6 Day 1 FOLFIRI plus Panitumumab dose reduced at 4 mg/kg.  11/21/2022: Cycle 7 Day 1 FOLFIRI plus Panitumumab dose reduced at 4 mg/kg.  12/05/2022: Cycle 8 Day 1 FOLFIRI plus Panitumumab dose reduced at 4 mg/kg.  02/26/2023: progression of disease noted on CT scan while hospitalized for intractable pain.   HISTORY OF PRESENTING ILLNESS:  Joseph Hogan 63 y.o. male returns for follow-up for metastatic rectal cancer. He is unaccompanied for this  visit.   On exam today, Joseph Hogan reports his pain is under good control today.  He reports he is taking about 2 of the short acting oxycodone and his  long-acting daily.  He reports he was having some issues with burning in his bladder but feels much better than he did when he was in the hospital.  He reports that he is eager to discuss the next steps of his treatment.  He notes that he is aware of his progression of disease and his overall poor prognosis.  He reports that he does have the pain in his legs which comes and goes.  He notes that if he could receive hospice at home he would be more agreeable to that.  We did discuss treatments including regorafenib, Lonsurf plus or minus bevacizumab, and comfort based care.  He did want some time to consider these options and was willing to discuss his care with our palliative care nurse practitioner today.  Overall he is willing and able to proceed with chemotherapy at this time.  He denies any fevers, chills, sweats, shortness of breath, chest pain or cough.  A full 10 point ROS is otherwise negative.  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   with abnormal R wave progression noted on EKG   MRSA (methicillin resistant Staphylococcus aureus)    Pre-diabetes    Rectal carcinoma (HCC) 03/19/2022    SURGICAL HISTORY: Past Surgical History:  Procedure Laterality Date   COLONOSCOPY     HUMERUS IM NAIL Right 04/12/2022   Procedure: INTRAMEDULLARY (IM) NAIL HUMERAL;  Surgeon: Bjorn Pippin, MD;  Location: [facility];  Service: Orthopedics;  Laterality: Right;   IR IMAGING GUIDED PORT INSERTION  04/05/2022   TOTAL HIP ARTHROPLASTY Left 02/06/2022   Procedure: TOTAL HIP ARTHROPLASTY;  Surgeon: Joen Laura, MD;  Location: [facility];  Service: Orthopedics;  Laterality: Left;    SOCIAL HISTORY: Social History   Socioeconomic History    Marital status: Divorced    Spouse name: Not on file   Number of children: Not on file   Years of education: Not on file   Highest education level: Not on file  Occupational History   Not on file  Tobacco Use   Smoking status: Never   Smokeless tobacco: Never  Vaping Use   Vaping status: Never Used  Substance and Sexual Activity   Alcohol use: No   Drug use: No   Sexual activity: Not Currently  Other Topics Concern   Not on file  Social History Narrative   Not on file   Social Drivers of Health   Financial Resource Strain: Not on file  Food Insecurity: No Food Insecurity (02/27/2023)   Hunger Vital Sign    Worried About Running Out of Food in the Last Year: Never true    Ran Out of Food in the Last Year: Never true  Transportation Needs: No Transportation Needs (02/27/2023)   PRAPARE - Administrator, Civil Service (Medical): No    Lack of Transportation (Non-Medical): No  Physical Activity: Not on file  Stress: Not on file  Social Connections: Not on file  Intimate Partner Violence: Not At Risk (02/27/2023)   Humiliation, Afraid, Rape, and Kick questionnaire    Fear of Current or Ex-Partner: No    Emotionally Abused: No    Physically Abused: No    Sexually Abused: No    FAMILY HISTORY: Family History  Problem Relation Age of Onset   Stroke Mother    Hypertension Mother  Heart disease Father    Alzheimer's disease Father    Hypertension Brother    Alcohol abuse Brother    Cancer Maternal Aunt    Heart disease Maternal Aunt    Cancer Maternal Uncle    Heart disease Maternal Uncle    Cancer Paternal Aunt    Heart disease Paternal Aunt    Cancer Paternal Uncle    Heart disease Paternal Uncle    Heart disease Paternal Grandmother    Alzheimer's disease Paternal Grandfather    Stomach cancer Neg Hx    Rectal cancer Neg Hx    Esophageal cancer Neg Hx    Colon cancer Neg Hx     ALLERGIES:  is allergic to codeine, dilaudid [hydromorphone hcl], and  lisinopril.  MEDICATIONS:  Current Outpatient Medications  Medication Sig Dispense Refill   cyanocobalamin (VITAMIN B12) 1000 MCG tablet Take 1 tablet (1,000 mcg total) by mouth daily. 30 tablet 1   dexamethasone (DECADRON) 4 MG tablet Take 1 tablet (4 mg total) by mouth every 12 (twelve) hours. 60 tablet 0   Menthol-Methyl Salicylate (MUSCLE RUB) 10-15 % CREA Apply 1 Application topically as needed for muscle pain. 35 g 0   nystatin (MYCOSTATIN/NYSTOP) powder Apply topically 3 (three) times daily. 15 g 0   oxyCODONE (OXYCONTIN) 10 mg 12 hr tablet Take 1 tablet (10 mg total) by mouth every 12 (twelve) hours. 14 tablet 0   oxyCODONE (OXYCONTIN) 40 mg 12 hr tablet Take 1 tablet (40 mg total) by mouth every 12 (twelve) hours. 10 tablet 0   Oxycodone HCl 10 MG TABS Take 1 tablet (10 mg total) by mouth every 4 (four) hours as needed. 90 tablet 0   potassium chloride (KLOR-CON M) 10 MEQ tablet Take 1 tablet (10 mEq total) by mouth daily. 30 tablet 0   senna-docusate (SENOKOT-S) 8.6-50 MG tablet Take 1 tablet by mouth 2 (two) times daily. 30 tablet 0   silver sulfADIAZINE (SILVADENE) 1 % cream Apply 1 Application topically daily. 50 g 3   tamsulosin (FLOMAX) 0.4 MG CAPS capsule Take 1 capsule (0.4 mg total) by mouth daily after supper. 30 capsule 2   No current facility-administered medications for this visit.    REVIEW OF SYSTEMS:   Constitutional: ( - ) fevers, ( - )  chills , ( - ) night sweats Eyes: ( - ) blurriness of vision, ( - ) double vision, ( - ) watery eyes Ears, nose, mouth, throat, and face: ( - ) mucositis, ( - ) sore throat Respiratory: ( - ) cough, ( - ) dyspnea, ( - ) wheezes Cardiovascular: ( - ) palpitation, ( - ) chest discomfort, ( - ) lower extremity swelling Gastrointestinal:  ( - ) nausea, ( - ) heartburn, ( - ) change in bowel habits Skin: ( + ) abnormal skin rashes Lymphatics: ( - ) new lymphadenopathy, ( - ) easy bruising Neurological: ( - ) numbness, ( - ) tingling,  ( - ) new weaknesses Behavioral/Psych: ( - ) mood change, ( - ) new changes  All other systems were reviewed with the patient and are negative.  PHYSICAL EXAMINATION: ECOG PERFORMANCE STATUS: 2 - Symptomatic, <50% confined to bed  Vitals:   03/13/23 1139  BP: 119/78  Pulse: (!) 109  Resp: 17  Temp: 98.7 F (37.1 C)  SpO2: 100%       There were no vitals filed for this visit.     GENERAL: well appearing male in NAD. Exam performed in  wheelchair.  SKIN: skin color, texture, turgor are normal,or significant lesions.   EYES: conjunctiva are pink and non-injected, sclera clear LUNGS: clear to auscultation and percussion with normal breathing effort HEART: regular rate & rhythm and no murmurs and no lower extremity edema Musculoskeletal: no cyanosis of digits and no clubbing  PSYCH: alert & oriented x 3, fluent speech NEURO: no focal motor/sensory deficits  LABORATORY DATA:  I have reviewed the data as listed    Latest Ref Rng & Units 03/13/2023   10:30 AM 03/01/2023    4:21 AM 02/27/2023    5:20 AM  CBC  WBC 4.0 - 10.5 K/uL 14.4  9.4    Hemoglobin 13.0 - 17.0 g/dL 9.1  7.6  6.8   Hematocrit 39.0 - 52.0 % 29.0  25.0  22.0   Platelets 150 - 400 K/uL 630  358         Latest Ref Rng & Units 03/13/2023   10:30 AM 03/01/2023    4:21 AM 02/27/2023    4:01 AM  CMP  Glucose 70 - 99 mg/dL 562  130  865   BUN 8 - 23 mg/dL 16  14  25    Creatinine 0.61 - 1.24 mg/dL 7.84  6.96  2.95   Sodium 135 - 145 mmol/L 134  138  136   Potassium 3.5 - 5.1 mmol/L 4.3  4.0  3.8   Chloride 98 - 111 mmol/L 99  102  103   CO2 22 - 32 mmol/L 26  26  24    Calcium 8.9 - 10.3 mg/dL 8.5  9.1  8.6   Total Protein 6.5 - 8.1 g/dL 6.2     Total Bilirubin 0.0 - 1.2 mg/dL 0.4     Alkaline Phos 38 - 126 U/L 349     AST 15 - 41 U/L 32     ALT 0 - 44 U/L 10        PATHOLOGY: Rectum, biopsy POORLY DIFFERENTIATED CARCINOMA. SEE NOTE. Diagnosis Note Addendum: Immunohistochemistry shows the carcinoma  is positive with MOC-31, cytokeratin 20 and CDX2. Tumor is negative with cytokeratin 7, cytokeratin 5/6, p40, TTF-1, Napsin A, prostate-specific antigen and prostein. The immunophenotype is consistent with primary colorectal adenocarcinoma.  RADIOGRAPHIC STUDIES: DG Tibia/Fibula Left Result Date: 02/27/2023 CLINICAL DATA:  63 year old male with history of osseous metastatic disease. EXAM: LEFT TIBIA AND FIBULA - 2 VIEW COMPARISON:  Left knee radiographs dated June 01, 2015. FINDINGS: There is a permeative ill-defined expansile destructive lesion in the proximal tibial diaphysis with aggressive appearing periosteal reaction and cortical breakthrough medially measuring approximately 11.4 x 4.2 cm, concerning for osseous metastatic disease. Moderate-to-severe degenerative changes of the left knee. Tibiotalar joint space narrowing. Calcaneal enthesopathy at the insertion of the Achilles tendon and the origin of the central cord of the plantar fascia. IMPRESSION: 1. Expansile permeative ill-defined lesion in the left proximal tibial diaphysis is concerning for osseous metastatic disease. 2. 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 Electronically Signed   By: Hart Robinsons M.D.   On: 02/27/2023 11:37   US Venous Img Lower Unilateral Left (DVT) Result Date: 02/27/2023 CLINICAL DATA:  63 year old male with a history of edema EXAM: LEFT LOWER EXTREMITY VENOUS DOPPLER ULTRASOUND TECHNIQUE: Gray-scale sonography with graded compression, as well as color Doppler and duplex ultrasound were performed to evaluate the lower extremity deep venous systems from the level of the common femoral vein and including the common femoral, femoral, profunda femoral, popliteal and calf veins including the posterior  tibial, peroneal and gastrocnemius veins when visible. The superficial great saphenous vein was also interrogated. Spectral Doppler was utilized to evaluate flow at rest and with distal augmentation maneuvers  in the common femoral, femoral and popliteal veins. COMPARISON:  None Available. FINDINGS: Contralateral Common Femoral Vein: Respiratory phasicity is normal and symmetric with the symptomatic side. No evidence of thrombus. Normal compressibility. Common Femoral Vein: No evidence of thrombus. Normal compressibility, and response to augmentation. Blunted respiratory phasicity. Saphenofemoral Junction: No evidence of thrombus. Normal compressibility and flow on color Doppler imaging. Profunda Femoral Vein: No evidence of thrombus. Normal compressibility and flow on color Doppler imaging. Blunted respiratory phasicity. Femoral Vein: No evidence of thrombus. Normal compressibility, response to augmentation. Blunted respiratory phasicity. Popliteal Vein: No evidence of thrombus. Normal compressibility, response to augmentation. Blunted respiratory phasicity. Calf Veins: No evidence of thrombus. Normal compressibility and flow on color Doppler imaging. Superficial Great Saphenous Vein: No evidence of thrombus. Normal compressibility and flow on color Doppler imaging. Other Findings:  Edema of the lower extremity. Irregular appearance of the cortex of the underlying bone of the lower left leg. Given findings on recent CT, this may represent additional metastatic disease. IMPRESSION: Directed duplex of the left lower extremity negative for DVT. Ultrasound demonstrates bony changes of the left lower leg, potentially additional sites of metastatic disease and further evaluation with plain film imaging may be useful. Blunted respiratory phasicity of the left-sided venous system likely secondary to venous compression that is observed on recent CT scan. These results were discussed by telephone at the time of interpretation on 02/27/2023 at 10:14 am with Dr. Gwenlyn Perking. Signed, Yvone Neu. Miachel Roux, RPVI [facility] Electronically Signed   By: Gilmer Mor D.O.   On:  02/27/2023 10:15   CT L-SPINE NO CHARGE Result Date: 02/26/2023 CLINICAL DATA:  Rectal cancer.  Back pain. EXAM: CT LUMBAR SPINE WITHOUT CONTRAST TECHNIQUE: Multidetector CT imaging of the lumbar spine was performed without intravenous contrast administration. Multiplanar CT image reconstructions were also generated. RADIATION DOSE REDUCTION: This exam was performed according to the departmental dose-optimization program which includes automated exposure control, adjustment of the mA and/or kV according to patient size and/or use of iterative reconstruction technique. COMPARISON:  CT lumbar spine 10/14/2022 FINDINGS: Segmentation: 5 lumbar type vertebrae. Alignment: Normal. Vertebrae: Multilevel mild degenerative changes of the spine. Posterior disc osteophyte complex formation at the L5-S1 level. Scattered axial and appendicular mix lytic and sclerotic metastasis static lesions. No acute fracture. Paraspinal and other soft tissues: Negative. Disc levels: Maintained. IMPRESSION: 1. Scattered axial and appendicular mix lytic and sclerotic metastasis lesions. 2. No acute displaced fracture or traumatic listhesis of the lumbar spine. 3. Please see separately dictated CT chest, abdomen, pelvis 10/14/2022. Electronically Signed   By: Tish Frederickson M.D.   On: 02/26/2023 22:49   CT Angio Chest PE W and/or Wo Contrast Addendum Date: 02/26/2023 ADDENDUM REPORT: 02/26/2023 22:46 ADDENDUM: The proctitis and cystitis may be due to radiation changes; however, infection is not excluded. Electronically Signed   By: Tish Frederickson M.D.   On: 02/26/2023 22:46   Result Date: 02/26/2023 CLINICAL DATA:  Abdominal pain, acute, nonlocalized; Pulmonary embolism (PE) suspected, high prob sick call. Pt c/o dehydration, leg, back and neck pain. EXAM: CT ANGIOGRAPHY CHEST CT ABDOMEN AND PELVIS WITH CONTRAST TECHNIQUE: Multidetector CT imaging of the chest was performed using the standard protocol during bolus administration of  intravenous contrast. Multiplanar CT image reconstructions and MIPs were obtained to  evaluate the vascular anatomy. Multidetector CT imaging of the abdomen and pelvis was performed using the standard protocol during bolus administration of intravenous contrast. RADIATION DOSE REDUCTION: This exam was performed according to the departmental dose-optimization program which includes automated exposure control, adjustment of the mA and/or kV according to patient size and/or use of iterative reconstruction technique. CONTRAST:  OMNIPAQUE IOHEXOL 350 MG/ML SOLN COMPARISON:  CT chest, abdomen, pelvis 10/14/2022, PET CT 07/11/2022, CT chest abdomen pelvis 02/05/2022 FINDINGS: CTA CHEST FINDINGS Cardiovascular: Right chest wall Port-A-Cath with tip in the right atrium. Satisfactory opacification of the pulmonary arteries to the segmental level. No evidence of pulmonary embolism. Normal heart size. No significant pericardial effusion. The thoracic aorta is normal in caliber. No atherosclerotic plaque of the thoracic aorta. No coronary artery calcifications. Mediastinum/Nodes: Interval development of bilateral hilar and mediastinal lymphadenopathy. No enlarged axillary lymph nodes. Thyroid gland, trachea, and esophagus demonstrate no significant findings. Lungs/Pleura: Emphysematous changes. Question development of tree-in-bud nodularity is within the inferior peripheral aspect of the right upper lobe. Stable right lower lobe subpleural calcified nodule. No pulmonary mass. No pleural effusion. No pneumothorax. Musculoskeletal: No chest wall abnormality. Scattered axial and appendicular mix lytic and sclerotic metastasis static lesions. Interval increase in size of multiple expansile lytic rib lesions with as an example a 4.9 x 3.5 cm (from 3.9 x 2.1 cm). Interval development of a T4 sclerotic vertebral body lesion. Partially visualized right humeral surgical hardware. No definite acute displaced a pathologic fracture.  Review of the MIP images confirms the above findings. CT ABDOMEN and PELVIS FINDINGS Hepatobiliary: No focal liver abnormality. No gallstones, gallbladder wall thickening, or pericholecystic fluid. No biliary dilatation. Pancreas: Diffusely atrophic. No focal lesion. Otherwise normal pancreatic contour. No surrounding inflammatory changes. No main pancreatic ductal dilatation. Spleen: Normal in size without focal abnormality. Adrenals/Urinary Tract: No adrenal nodule bilaterally. Bilateral kidneys enhance symmetrically. Fluid density lesions likely represent simple renal cysts. Simple renal cysts, in the absence of clinically indicated signs/symptoms, require no independent follow-up. No hydronephrosis. No hydroureter. The urinary bladder is distended with urine. Circumferential urinary bladder wall thickening. Stomach/Bowel: Stomach is within normal limits. No evidence of small bowel wall thickening or dilatation. Interval development of right tool wall thickening and mucosal hyperemia with perirectal fat stranding. Appendix appears normal. Vascular/Lymphatic: No abdominal aorta or iliac aneurysm. Mild atherosclerotic plaque of the aorta and its branches. No abdominal, pelvic, or inguinal lymphadenopathy. Reproductive: Prostate is unremarkable. Other: No intraperitoneal free fluid. No intraperitoneal free gas. No organized fluid collection. Musculoskeletal: No abdominal wall hernia or abnormality. Stable to slightly increased in size expansile pedunculated lesion arising from the left inferior pubic rami. Scattered axial and appendicular mix lytic and sclerotic metastasis static lesions. Total left hip arthroplasty. No definite pathologic fracture. Please see separately dictated CT lumbar spine 02/26/2023. Review of the MIP images confirms the above findings. IMPRESSION: 1. No pulmonary embolus. 2. Question development of tree-in-bud nodularity is within the inferior peripheral aspect of the right upper lobe.  Finding could represent developing infection. 3. Interval development of bilateral hilar and mediastinal lymphadenopathy. 4. Interval development of proctitis. Recommend colonoscopy status post treatment and status post complete resolution of inflammatory changes to exclude an underlying lesion. 5. Interval development of circumferential urinary bladder wall thickening. Correlate with urinalysis for infection. 6. Scattered axial and appendicular mix lytic and sclerotic metastasis static lesions. table to slightly increased in size expansile pedunculated lesion arising from the left inferior pubic rami. Interval increase in size of multiple expansile lytic  rib lesions with as an example a 4.9 x 3.5 cm (from 3.9 x 2.1 cm). Interval development of a T4 sclerotic vertebral body lesion. 7. No definite pathologic fracture. Electronically Signed: By: Tish Frederickson M.D. On: 02/26/2023 22:42   CT ABDOMEN PELVIS W CONTRAST Addendum Date: 02/26/2023 ADDENDUM REPORT: 02/26/2023 22:46 ADDENDUM: The proctitis and cystitis may be due to radiation changes; however, infection is not excluded. Electronically Signed   By: Tish Frederickson M.D.   On: 02/26/2023 22:46   Result Date: 02/26/2023 CLINICAL DATA:  Abdominal pain, acute, nonlocalized; Pulmonary embolism (PE) suspected, high prob sick call. Pt c/o dehydration, leg, back and neck pain. EXAM: CT ANGIOGRAPHY CHEST CT ABDOMEN AND PELVIS WITH CONTRAST TECHNIQUE: Multidetector CT imaging of the chest was performed using the standard protocol during bolus administration of intravenous contrast. Multiplanar CT image reconstructions and MIPs were obtained to evaluate the vascular anatomy. Multidetector CT imaging of the abdomen and pelvis was performed using the standard protocol during bolus administration of intravenous contrast. RADIATION DOSE REDUCTION: This exam was performed according to the departmental dose-optimization program which includes automated exposure control,  adjustment of the mA and/or kV according to patient size and/or use of iterative reconstruction technique. CONTRAST:  OMNIPAQUE IOHEXOL 350 MG/ML SOLN COMPARISON:  CT chest, abdomen, pelvis 10/14/2022, PET CT 07/11/2022, CT chest abdomen pelvis 02/05/2022 FINDINGS: CTA CHEST FINDINGS Cardiovascular: Right chest wall Port-A-Cath with tip in the right atrium. Satisfactory opacification of the pulmonary arteries to the segmental level. No evidence of pulmonary embolism. Normal heart size. No significant pericardial effusion. The thoracic aorta is normal in caliber. No atherosclerotic plaque of the thoracic aorta. No coronary artery calcifications. Mediastinum/Nodes: Interval development of bilateral hilar and mediastinal lymphadenopathy. No enlarged axillary lymph nodes. Thyroid gland, trachea, and esophagus demonstrate no significant findings. Lungs/Pleura: Emphysematous changes. Question development of tree-in-bud nodularity is within the inferior peripheral aspect of the right upper lobe. Stable right lower lobe subpleural calcified nodule. No pulmonary mass. No pleural effusion. No pneumothorax. Musculoskeletal: No chest wall abnormality. Scattered axial and appendicular mix lytic and sclerotic metastasis static lesions. Interval increase in size of multiple expansile lytic rib lesions with as an example a 4.9 x 3.5 cm (from 3.9 x 2.1 cm). Interval development of a T4 sclerotic vertebral body lesion. Partially visualized right humeral surgical hardware. No definite acute displaced a pathologic fracture. Review of the MIP images confirms the above findings. CT ABDOMEN and PELVIS FINDINGS Hepatobiliary: No focal liver abnormality. No gallstones, gallbladder wall thickening, or pericholecystic fluid. No biliary dilatation. Pancreas: Diffusely atrophic. No focal lesion. Otherwise normal pancreatic contour. No surrounding inflammatory changes. No main pancreatic ductal dilatation. Spleen: Normal in size without  focal abnormality. Adrenals/Urinary Tract: No adrenal nodule bilaterally. Bilateral kidneys enhance symmetrically. Fluid density lesions likely represent simple renal cysts. Simple renal cysts, in the absence of clinically indicated signs/symptoms, require no independent follow-up. No hydronephrosis. No hydroureter. The urinary bladder is distended with urine. Circumferential urinary bladder wall thickening. Stomach/Bowel: Stomach is within normal limits. No evidence of small bowel wall thickening or dilatation. Interval development of right tool wall thickening and mucosal hyperemia with perirectal fat stranding. Appendix appears normal. Vascular/Lymphatic: No abdominal aorta or iliac aneurysm. Mild atherosclerotic plaque of the aorta and its branches. No abdominal, pelvic, or inguinal lymphadenopathy. Reproductive: Prostate is unremarkable. Other: No intraperitoneal free fluid. No intraperitoneal free gas. No organized fluid collection. Musculoskeletal: No abdominal wall hernia or abnormality. Stable to slightly increased in size expansile pedunculated lesion arising from  the left inferior pubic rami. Scattered axial and appendicular mix lytic and sclerotic metastasis static lesions. Total left hip arthroplasty. No definite pathologic fracture. Please see separately dictated CT lumbar spine 02/26/2023. Review of the MIP images confirms the above findings. IMPRESSION: 1. No pulmonary embolus. 2. Question development of tree-in-bud nodularity is within the inferior peripheral aspect of the right upper lobe. Finding could represent developing infection. 3. Interval development of bilateral hilar and mediastinal lymphadenopathy. 4. Interval development of proctitis. Recommend colonoscopy status post treatment and status post complete resolution of inflammatory changes to exclude an underlying lesion. 5. Interval development of circumferential urinary bladder wall thickening. Correlate with urinalysis for infection. 6.  Scattered axial and appendicular mix lytic and sclerotic metastasis static lesions. table to slightly increased in size expansile pedunculated lesion arising from the left inferior pubic rami. Interval increase in size of multiple expansile lytic rib lesions with as an example a 4.9 x 3.5 cm (from 3.9 x 2.1 cm). Interval development of a T4 sclerotic vertebral body lesion. 7. No definite pathologic fracture. Electronically Signed: By: Tish Frederickson M.D. On: 02/26/2023 22:42    ASSESSMENT & PLAN Joseph Hogan is a 63 y.o. male who presents to the clinic for a follow up for rectal carcinoma.   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  --We reviewed that his cancer is incurable and mainstay treatment is chemotherapy. --Recommend chemotherapy regimen FOLFOX plus Bevacizumab q 2 weeks, started on 04/08/2022.  --progression noted on PET/CT scan from 07/11/2022. D/c FOLFOX + Bev and start FOLFIRI + Panitumumab on 07/31/2022. PLAN: --Labs from today show WBC 14.4 (on steroids), hemoglobin 9.1, MCV 83.1, platelets 630.  -- Discussed treatment options with patient moving forward including regorafenib, Lonsurf plus or minus bevacizumab, and comfort based care. --Patient would like to think about these options and discuss his options with our palliative care NP today --RTC pending results of his decision  #Acneiform rash-- resolved --Secondary to panitumumab  #Microcytic anemia: #Thrombocytosis: --patient received IV venofer 200 mg once a week x 3  #Right shoulder pain # Leg Pain  # Bone Pain from Metastatic Cancer  --Right shoulder xray form 04/08/2022 showed destructive lesion within the proximal to mid right humerus shaft with acute to subacture pathologic fracture. --Patient underwent pinning of right  humerus on 04/12/2022 --Received palliative radiation from 05/14/2022-06/03/2022 to right humerus, L3 spine and pelvis --Current pain regimen includes Xtampza 27 mg q 12 hours, oxycodone 5-10 mg q 6 hours for breakthrough pain.  He has not been requiring as needed oxy. --following with palliative care team.   #Supportive Care -- chemotherapy education complete -- port placement complete.  -- zofran 8mg  q8H PRN and compazine 10mg  PO q6H for nausea -- EMLA cream for port  No orders of the defined types were placed in this encounter.   All questions were answered. The patient knows to call the clinic with any problems, questions or concerns.  I have spent a total of 30 minutes minutes of face-to-face and non-face-to-face time, preparing to see the patient, performing a medically appropriate examination, counseling and educating the patient, ordering medications/tests/procedures,  documenting clinical information in the electronic health record,  and care coordination.    Ulysees Barns, MD Department of Hematology/Oncology [facility] at [facility] Phone: [phone] Pager: [phone] Email: [email or web address]

## 2023-03-13 NOTE — Progress Notes (Signed)
Palliative Medicine [facility]  [phone] [phone]   Name: Joseph Hogan Date: 03/13/2023 MRN: [id number]  DOB: 22-Jun-1960  Patient Care Team: [name], MD as PCP - General (Hematology and Oncology) Doreatha Massed, MD as Medical Oncologist (Medical Oncology) Pickenpack-Cousar, Arty Baumgartner, NP as Nurse Practitioner (Hospice and Palliative Medicine)   INTERVAL HISTORY: Joseph Hogan is a 63 y.o. 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 Palliative ask to see for symptom management and goals of care.   SOCIAL HISTORY:     reports that he has never smoked. He has never used smokeless tobacco. He reports that he does not drink alcohol and does not use drugs.  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  MEDICATIONS:  Current Outpatient Medications  Medication Sig Dispense Refill   cyanocobalamin (VITAMIN B12) 1000 MCG tablet Take 1 tablet (1,000 mcg total) by mouth daily. 30 tablet 1   dexamethasone (DECADRON) 4 MG tablet Take 1 tablet (4 mg total) by mouth every 12 (twelve) hours. 60 tablet 0   Menthol-Methyl Salicylate (MUSCLE RUB) 10-15 % CREA Apply 1 Application topically as needed for muscle pain. 35 g 0   nystatin (MYCOSTATIN/NYSTOP) powder Apply topically 3 (three) times daily. 15 g 0   oxyCODONE (OXYCONTIN) 10 mg 12 hr tablet Take 1 tablet (10 mg total) by mouth every 12  (twelve) hours. 14 tablet 0   oxyCODONE (OXYCONTIN) 40 mg 12 hr tablet Take 1 tablet (40 mg total) by mouth every 12 (twelve) hours. 10 tablet 0   Oxycodone HCl 10 MG TABS Take 1 tablet (10 mg total) by mouth every 6 (six) hours as needed. 10 tablet 0   potassium chloride (KLOR-CON M) 10 MEQ tablet Take 1 tablet (10 mEq total) by mouth daily. 30 tablet 0   senna-docusate (SENOKOT-S) 8.6-50 MG tablet Take 1 tablet by mouth 2 (two) times daily. 30 tablet 0   silver sulfADIAZINE (SILVADENE) 1 % cream Apply 1 Application topically daily. 50 g 3   tamsulosin (FLOMAX) 0.4 MG CAPS capsule Take 1 capsule (0.4 mg total) by mouth daily after supper. 30 capsule 2   No current facility-administered medications for this visit.    VITAL SIGNS: There were no vitals taken for this visit. There were no vitals filed for this visit.  Estimated body mass index is 24.6 kg/m as calculated from the following:   Height as of 02/26/23: 5\' 11"  (1.803 m).   Weight as of 02/26/23: 176 lb 5.9 oz (80 kg).   PERFORMANCE STATUS (ECOG) : 2 - Symptomatic, <50% confined to bed  Assessment Weak, cachectic Tachycardic Normal breathing pattern Skin dry/flaky Muscle wasting AAO x3  Discussed the use of AI scribe software for clinical note transcription with the patient, who gave verbal consent to proceed.   IMPRESSION:  Joseph Hogan presents to clinic today for symptom management follow-up and goals of care discussions. He was  seen by Dr. Leonides Schanz today who discussed discontinuation of treatment due to progression. Joseph Hogan is emotional expressing understanding that he is "dying". He tells me he only has weeks to live. Emotional support provided. He denies vomiting or diarrhea. He has occasional nausea which is controlled with anti-emetics and stool softeners for constipation.   The patient was recently hospitalized and discharged to [facility] and is currently still at the facility receiving care. He expresses  his hopes of returning home soon and spending what time he has left there.   Joseph Hogan appears uncomfortable in wheelchair. He reports persistent pain in the rear end and legs, currently managed with oxycodone ER and Oxycodone IR. He feels overloaded with the current medication regimen and desires effective pain management. States he doesn't feel as though his pain is well controlled however some days are better than others.   I reviewed at length current medication list brought in with Joseph Hogan from facility. We discussed his current regimen which consist of Xtampza 13.5 mg every 12 hours and Oxycodone every 6 hours as needed for pain. Tylenol as needed. I recommended increasing his oxycodone to every 4 hours as needed for breakthrough pain. He would like to maintain current dose of Xtampza for now however requesting to call and check on him over the next week.   Goals of Care I empathetically approached discussions with Joseph Hogan regarding his current cancer state, co morbidities, and overall health decline. He acknowledges that his quality of life is poor. He does not wish to suffer. Emotional support provided.   I created space and opportunity for patient to openly express his thoughts and feelings. Joseph Hogan is remaining hopeful however is prepared to face end-of-life. We discussed a more comfort focused patch allowing him to be comfortable in his home amongst family and friends. He states this is his desire as he knows time is limited.   Education provided on the differences in palliative and hospice in addition to hospice's goals and philosophy of care. Joseph Hogan initially declined hospice during the conversation expressing "I want to be home and not in a facility". After exploring his statement more and providing education that he can receive hospice support in his home environment with support from their staff and his family. I spent time explaining what this would look like once he discharged from  the rehabilitation.   I had open and direct discussion with patient that if his goal is to focus on his comfort in the home and at facility while finishing out his rehab days this would mean no additional oncology treatments such as oral chemotherapies, labs, imaging, or office visits. He is clear in his understanding expressing this is what he would like to referencing wishes for comfort focused and home with family. I again explained acceptance of hospice care which he confirms requesting to please proceed with referral for outpatient palliative to begin establishing care with the team with hopes of being able to transition to hospice care and return home.   I empathetically approached discussions regarding health care limitations specific to code status in the event of cardiopulmonary event. Education provided on DNR/DNI. Camron is clear expressing his desires for natural death allowing him to go when his time comes peacefully. He prefers a natural passing if his heart stops or if he passes away in a couple of weeks, indicating a preference for not being put on a machine. The patient is aware I will send an out of facility form back  to the rehab with him identifying his wishes.   Patient request updating his [name]. with discussions and plans. I was able to speak with son as requested. Extensive discussion and review of my visit with his father. I provided updates to son including Mr. Earwood's expressed wishes. Joseph Drone. verbalized understanding expressing his support and mutual agreement with DNR/DNI, initiating palliative support with goal of transitioning to hospice in the home in the upcoming weeks. He states his father is supposed to be at the facility for 21 days and has been there almost a week thus far.   We discussed plans for ongoing pain and symptom management. Joseph Drone. expresses hopes for patient's pain to be managed however would not want around the clock medications for minor aches and  pains to allow patient the ability to be alert as possible. He expresses appreciation of his father's ability to competently communicate with him and other family members over the past week as he seemed more sedated and somnolent during recent hospitalization.   Extensive education provided on hospice's goals and philosophy of care, what care would look like in the home with a goal of comfort focused (understanding family and friends would be needed to assist in care 24/7 with hospice support), and requirements for inpatient hospice if patient's symptoms or condition could not be managed in the home. He understands we will continue to support through this journey as patient begin transitioning. Son verbalized understanding again confirming agreement with mentioned plan.   All questions answered and support provided.  11/07/22- We discussed his current illness and what it means in the larger context of his on-going co-morbidities. Natural disease trajectory and expectations were discussed.   Patient's son verbalized understanding of his father's current illness. Patient also able to acknowledge some form of understanding but with some obvious signs of confusion when it comes to memory recall. He mentions his children assist with most of his medical needs.    I empathetically approached discussions regarding healthcare limitations including code status and advanced directives. Son, Joseph Hogan states they are in the process of completing documents. Joseph Hogan (patient) confirms. They verbalize patient's [name] is primary medical decision maker. They request patient to remain full code with full scope care. Joseph Hogan and son are clear in expressed wishes to continue to treat the treatable allowing patient every opportunity to continue to do as good as he can for as long as he can while managing his symptoms.    We discussed Her current illness and what it means in the larger context of Her on-going  co-morbidities. Natural disease trajectory and expectations were discussed.  I discussed the importance of continued conversation with family and their medical providers regarding overall plan of care and treatment options, ensuring decisions are within the context of the patients values and GOCs.  Assessment and Plan  End of Life Care/Goals of Care  Patient with limited prognosis, considering hospice care at home. No interest in aggressive resuscitation measures. Wishes to focus on his comfort and symptom management alleviating any suffering.  -Initiate hospice care at home once patient finishes up rehab days at [facility] per patient and family.  -Referral placed with [facility] for outpatient palliative with understanding of goals over the next days-weeks.  -Extensive discussion with patient and son. Mutual decisions on comfort focused care. No further chemotherapy understandings this is not an option at end-of-life.  -DNR/DNI out of facility form sent with paperwork to facility -Updates provided to  Dr. Leonides Schanz and Joseph Sandy, RN.   Pain Management Pain in the lower extremities and buttocks, currently managed with Oxycodone ER and Oxycodone IR 10mg  every 6 hours. -Change Oxycodone 10mg  to every 4 hours as needed for pain. -Continue Oxycodone ER as prescribed. -Review medication regimen to ensure optimal pain control. -Will check in with patient over the next week to ensure symptoms are being effectively managed.   General Health Maintenance / Follo-wup Plans -Follow up with patient next week to assess pain control and ongoing plans to transition to hospice care in the home. -Contact information provided for son to call with any questions or needs.  Any controlled substances utilized were prescribed in the context of palliative care. PDMP has been reviewed.   Visit consisted of counseling and education dealing with the complex and emotionally intense issues of symptom management and  palliative care in the setting of serious and potentially life-threatening illness.  Willette Alma, AGPCNP-BC  Palliative Medicine Team/[facility]

## 2023-03-14 DIAGNOSIS — Z515 Encounter for palliative care: Secondary | ICD-10-CM | POA: Diagnosis not present

## 2023-03-14 DIAGNOSIS — E43 Unspecified severe protein-calorie malnutrition: Secondary | ICD-10-CM | POA: Diagnosis not present

## 2023-03-14 DIAGNOSIS — N390 Urinary tract infection, site not specified: Secondary | ICD-10-CM | POA: Diagnosis not present

## 2023-03-14 DIAGNOSIS — G893 Neoplasm related pain (acute) (chronic): Secondary | ICD-10-CM | POA: Diagnosis not present

## 2023-03-15 ENCOUNTER — Inpatient Hospital Stay: Payer: Medicare HMO

## 2023-03-18 DIAGNOSIS — N3289 Other specified disorders of bladder: Secondary | ICD-10-CM | POA: Diagnosis not present

## 2023-03-18 DIAGNOSIS — R339 Retention of urine, unspecified: Secondary | ICD-10-CM | POA: Diagnosis not present

## 2023-03-18 DIAGNOSIS — Z978 Presence of other specified devices: Secondary | ICD-10-CM | POA: Diagnosis not present

## 2023-03-18 DIAGNOSIS — R5381 Other malaise: Secondary | ICD-10-CM | POA: Diagnosis not present

## 2023-03-19 DIAGNOSIS — E119 Type 2 diabetes mellitus without complications: Secondary | ICD-10-CM | POA: Diagnosis not present

## 2023-03-19 DIAGNOSIS — L89152 Pressure ulcer of sacral region, stage 2: Secondary | ICD-10-CM | POA: Diagnosis not present

## 2023-03-23 IMAGING — CT CT HEAD W/O CM
3 series · 15 of 47 positions shown, 18 images · non-contrast
Comparison: 11/20/2014

CLINICAL DATA: Dizziness

EXAM:
CT HEAD WITHOUT CONTRAST
TECHNIQUE: Contiguous axial images were obtained from the base of the skull
through the vertex without intravenous contrast.

[Series 3: head w o · axial · 0.44mm/px · z∈[+50,+180]mm · 9 of 32 slices shown, 12 images]
[im 3/32  brain]
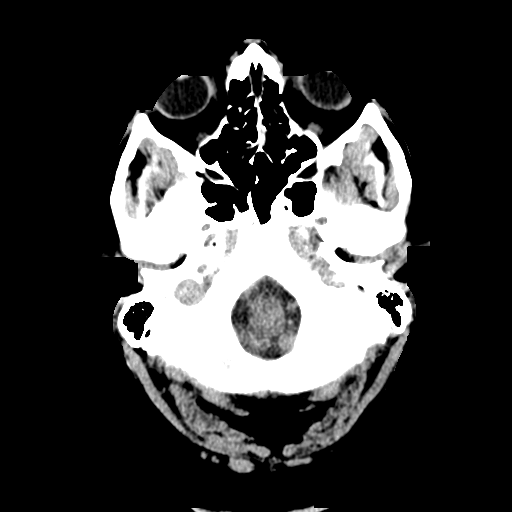
[im 3/32  bone]
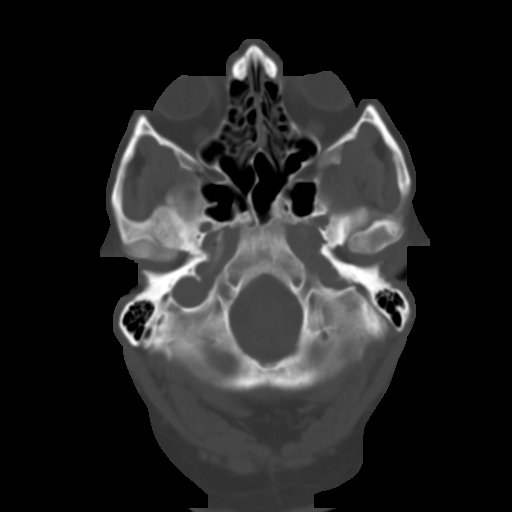
[im 6/32  brain]
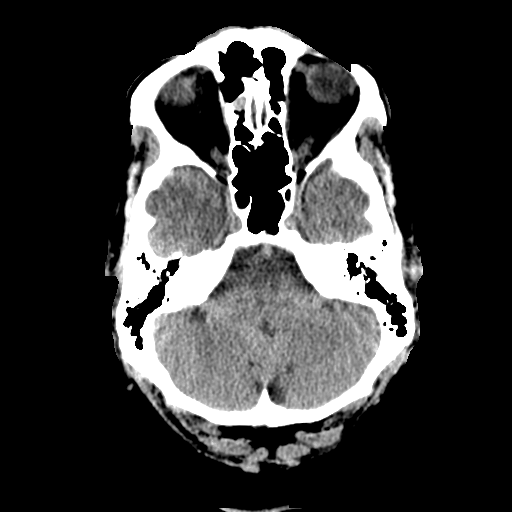
[im 9/32  brain]
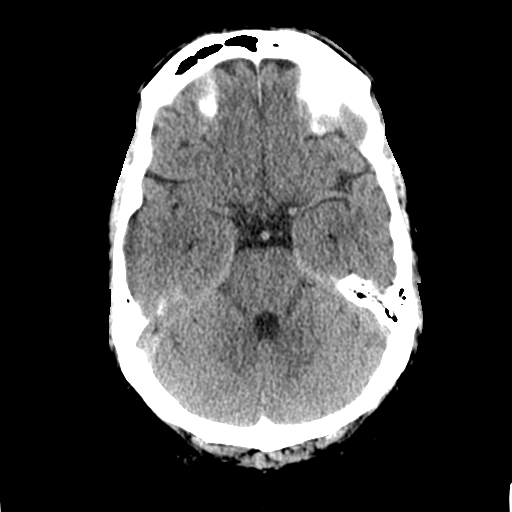
[im 12/32  brain]
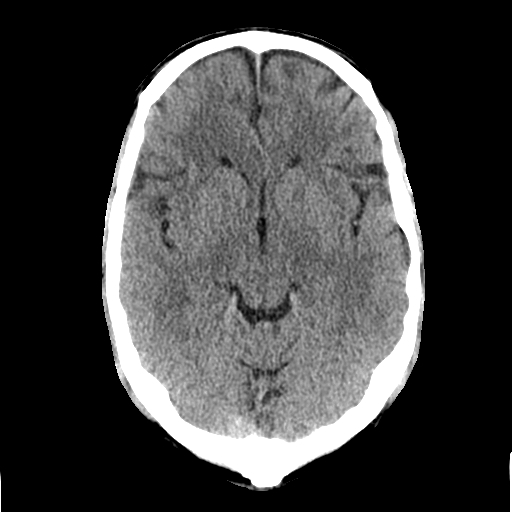
[im 17/32  brain]
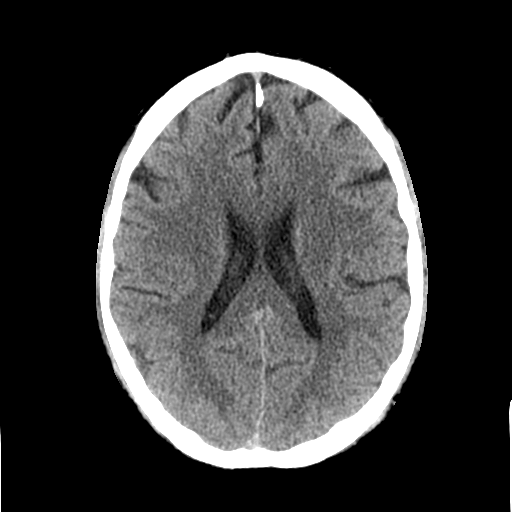
[im 17/32  bone]
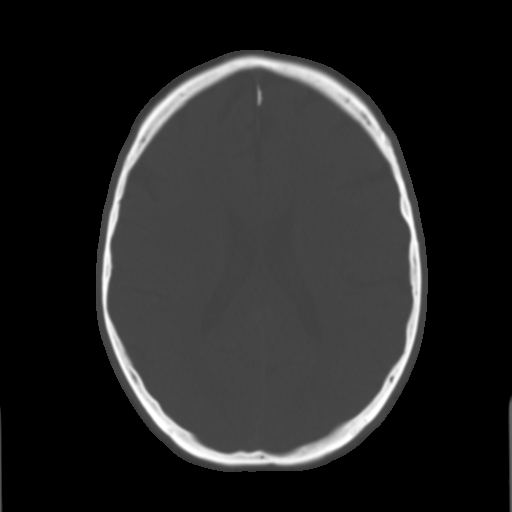
[im 20/32  brain]
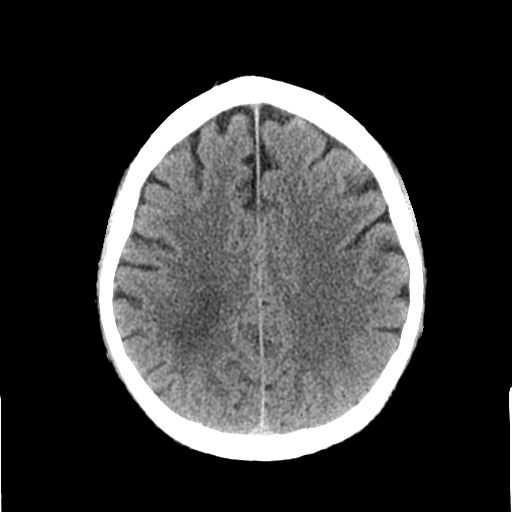
[im 23/32  brain]
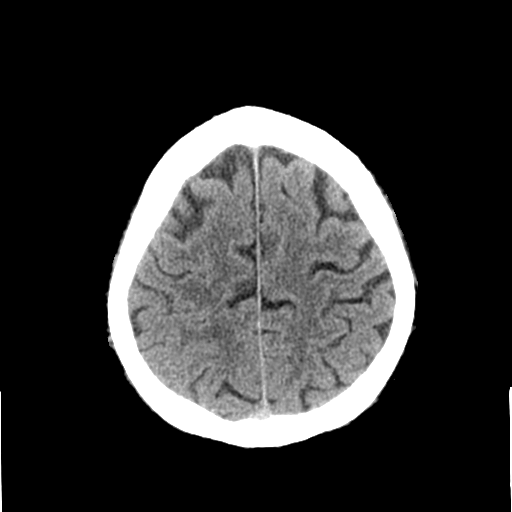
[im 26/32  brain]
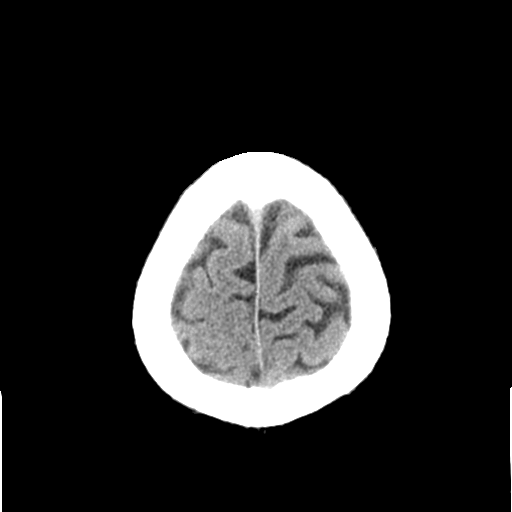
[im 29/32  brain]
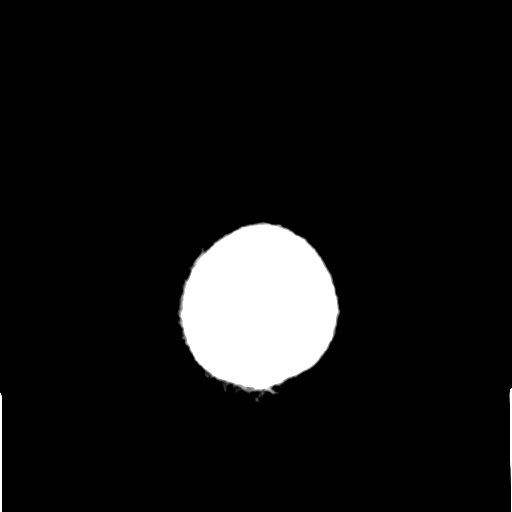
[im 29/32  bone]
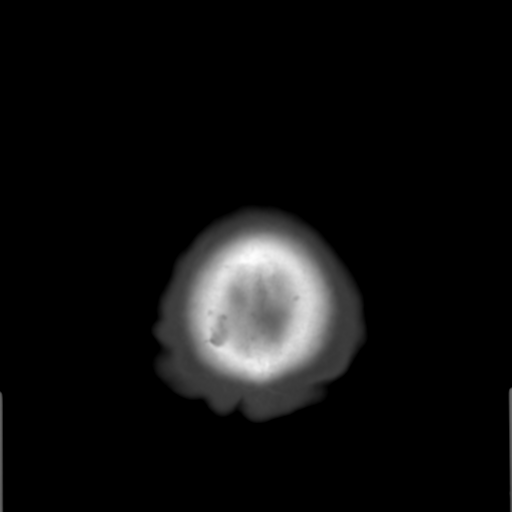

[Series 5: coronal soft · coronal · 0.33mm/px · 3 of 75 slices shown]
[im 25/75  brain]
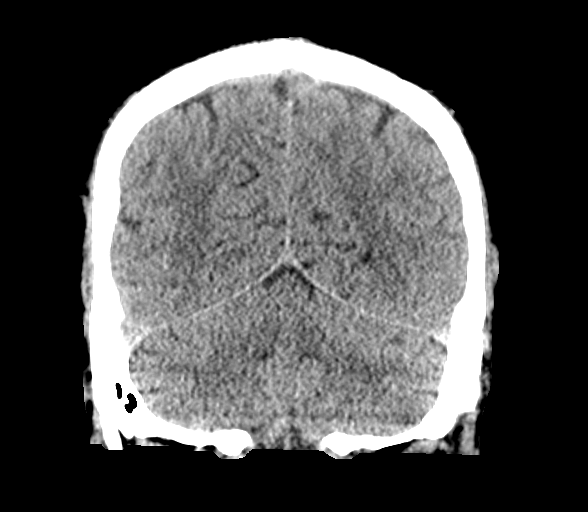
[im 33/75  brain]
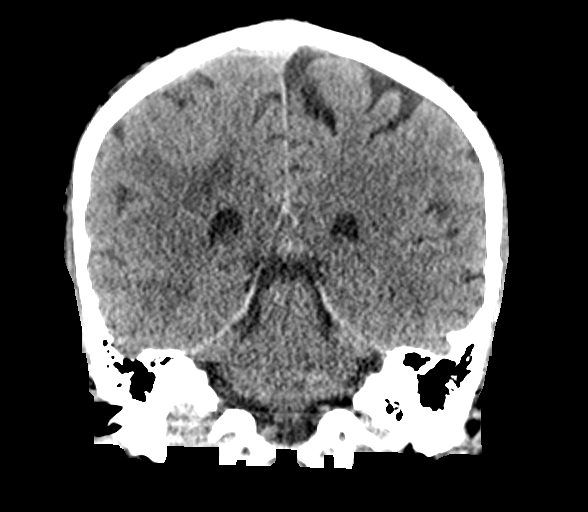
[im 42/75  brain]
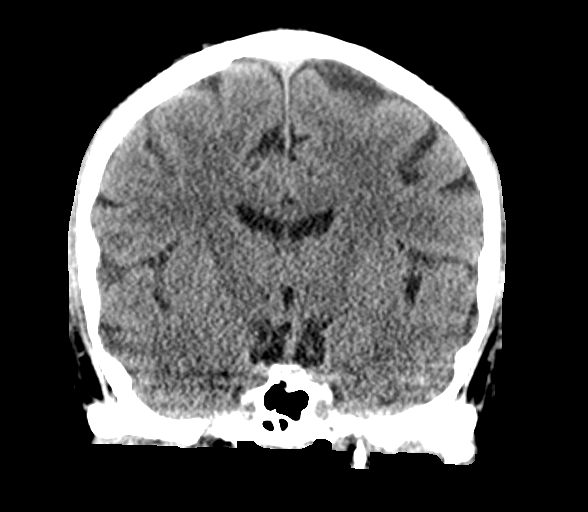

[Series 6: sagittal soft · sagittal · 0.33mm/px · 3 of 61 slices shown]
[im 21/61  brain]
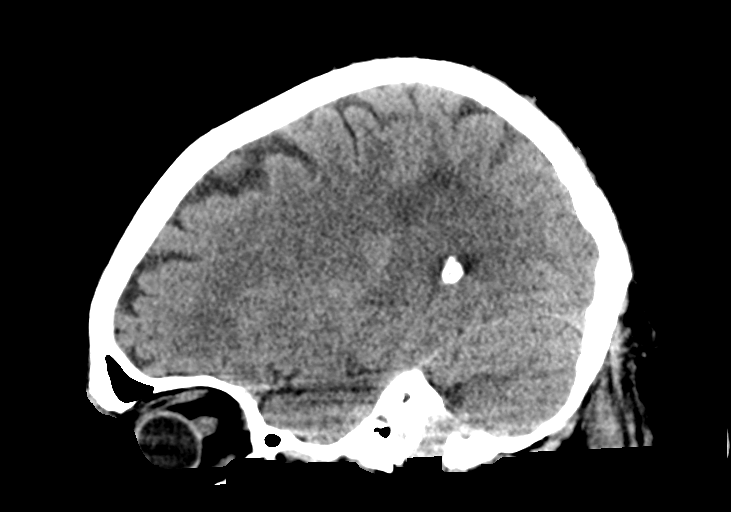
[im 31/61  brain]
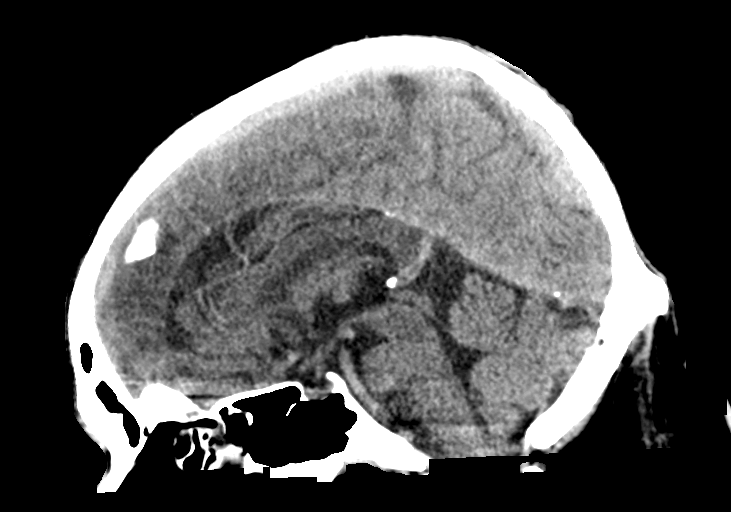
[im 41/61  brain]
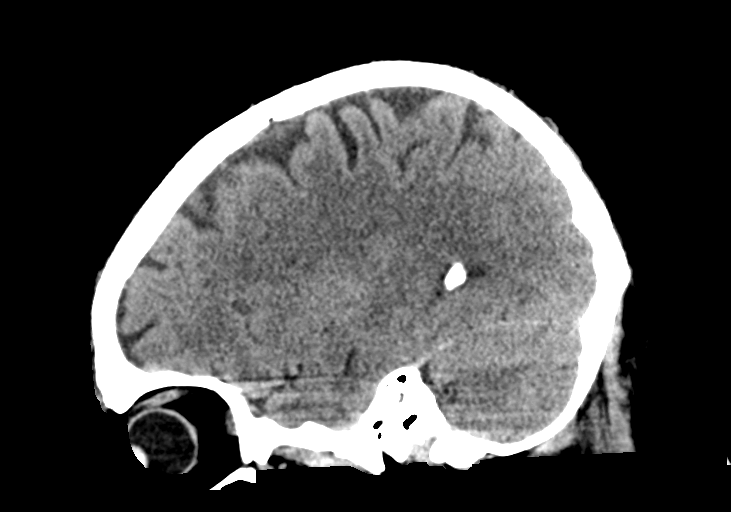

[15 of 47 positions shown; findings below may reference images not displayed]

FINDINGS: Brain: Stable hypoattenuation in right parietal white matter. No
acute intracranial hemorrhage, midline shift, acute infarct,
hydrocephalus, or mass.

Vascular: No hyperdense vessel or unexpected calcification.

Skull: Normal. Negative for fracture or focal lesion.

Sinuses/Orbits: No acute finding.

Other: None
IMPRESSION: No acute findings. Stable right parietal white matter
hypoattenuation.

## 2023-03-24 ENCOUNTER — Telehealth: Payer: Self-pay

## 2023-03-24 NOTE — Telephone Encounter (Signed)
 Notified patient's son that his FMLA documents had been completed and faxed back to company. Fax confirmation received. Copy of documents emailed to son's email on file. No further concerns at this time.

## 2023-03-26 DIAGNOSIS — R5381 Other malaise: Secondary | ICD-10-CM | POA: Diagnosis not present

## 2023-03-26 DIAGNOSIS — R339 Retention of urine, unspecified: Secondary | ICD-10-CM | POA: Diagnosis not present

## 2023-03-26 DIAGNOSIS — G893 Neoplasm related pain (acute) (chronic): Secondary | ICD-10-CM | POA: Diagnosis not present

## 2023-03-26 DIAGNOSIS — L89152 Pressure ulcer of sacral region, stage 2: Secondary | ICD-10-CM | POA: Diagnosis not present

## 2023-03-26 DIAGNOSIS — C20 Malignant neoplasm of rectum: Secondary | ICD-10-CM | POA: Diagnosis not present

## 2023-03-26 DIAGNOSIS — E119 Type 2 diabetes mellitus without complications: Secondary | ICD-10-CM | POA: Diagnosis not present

## 2023-04-08 ENCOUNTER — Ambulatory Visit: Payer: Medicare HMO | Admitting: Urology

## 2023-04-08 DIAGNOSIS — Z515 Encounter for palliative care: Secondary | ICD-10-CM | POA: Diagnosis not present

## 2023-04-08 DIAGNOSIS — N390 Urinary tract infection, site not specified: Secondary | ICD-10-CM | POA: Diagnosis not present

## 2023-04-08 DIAGNOSIS — R279 Unspecified lack of coordination: Secondary | ICD-10-CM | POA: Diagnosis not present

## 2023-04-08 DIAGNOSIS — Z7401 Bed confinement status: Secondary | ICD-10-CM | POA: Diagnosis not present

## 2023-04-08 DIAGNOSIS — E43 Unspecified severe protein-calorie malnutrition: Secondary | ICD-10-CM | POA: Diagnosis not present

## 2023-04-08 DIAGNOSIS — R1310 Dysphagia, unspecified: Secondary | ICD-10-CM | POA: Diagnosis not present

## 2023-04-08 DIAGNOSIS — G893 Neoplasm related pain (acute) (chronic): Secondary | ICD-10-CM | POA: Diagnosis not present

## 2023-04-08 NOTE — Progress Notes (Deleted)
 Name: Joseph Hogan DOB: 05/03/1960 MRN: [id number]  History of Present Illness: [name] is a 63 y.o. male who presents today as a new patient at [facility] for hospital follow up. All available relevant medical records have been reviewed. ***He is accompanied by ***.  Recent history:  > 02/26/2023 - 03/06/2023: - Admitted for intractable pain in the setting of metastatic rectal cancer with diffuse bony metastases. Previous treatments have included chemotherapy with FOLFIRI/panitumumab and palliative radiation treatment. "He was seen by oncology and discussion was had by Dr. Ellin Saba with family members. It was discussed that there are not any other treatment options left at this time and that his condition was incurable."  - During admission he "was also noted to have some AKI and urinary retention and therefore a Foley catheter was placed." - Per chart review his creatinine and GFR were both within normal limits on 02/26/23, 02/27/23, and 03/01/23. - UA on 02/26/2023 was unremarkable with no evidence of UTI.  - CT abdomen/pelvis w/ contrast on 02/26/2023: 1. Simple renal cysts. 2. No GU stones / masses / hydroureteronephrosis.  3. Bladder distended with urine. Circumferential urinary bladder wall thickening. 4. Prostate unremarkable.   > 03/10/2023:  - Seen in ER for catheter concerns.  - "Catheter does not appear to be draining on exam, was replaced with daily and is now draining."  - Afebrile.  - "Urine specimen does show trace leukocyte esterase as well as hematuria although possibly contributing from catheter. Will treat for possible early UTI given onset of dysuria today."  - Keflex prescribed.  > Of note, prior PSA values were normal (1.43 on 04/08/2015 and 0.8 on 06/04/2016).  Today: He {Actions; denies-reports:120008} history of urinary retention.  He reports the catheter is draining***.  He {Actions; denies-reports:120008} gross hematuria.  He {Actions;  denies-reports:120008} flank pain or abdominal pain. He {Actions; denies-reports:120008} fevers, nausea, or vomiting.  He {Actions; denies-reports:120008} constipation. He *** taking laxatives, stool softeners, or fiber supplements.  ***per chart review, not sure why they felt he had evidence of AKI when his renal labs were WNL and no evidence of hydroureteronephrosis on CT. Unknown if the bladder distention seen on CT was post-void or not. 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  ***ok to do voiding trial if desired. Patient is on palliative care (or hospice??) however so if he prefers to keep catheter for comfort / ease / due to decreased mobility that's okay too  ***is he residing at a SNF? If so, they would be able to replace the catheter if needed should he start having voiding difficulty   Fall Screening: Do you usually have a device to assist in your mobility? {yes/no:20286} ***cane / ***walker / ***wheelchair   Medications: Current Outpatient Medications  Medication Sig Dispense Refill   cyanocobalamin (VITAMIN B12) 1000 MCG tablet Take 1 tablet (1,000 mcg total) by mouth daily. 30 tablet 1   dexamethasone (DECADRON) 4 MG tablet Take 1 tablet (4 mg total) by mouth every 12 (twelve) hours. 60 tablet 0   Menthol-Methyl Salicylate (MUSCLE RUB) 10-15 % CREA Apply 1 Application topically as needed for muscle pain. 35 g 0   nystatin (MYCOSTATIN/NYSTOP) powder Apply topically 3 (three) times daily. 15 g 0   oxyCODONE (OXYCONTIN) 10 mg 12 hr tablet Take 1 tablet (10 mg total) by mouth every 12 (twelve) hours. 14 tablet 0  oxyCODONE (OXYCONTIN) 40 mg 12 hr tablet Take 1 tablet (40 mg total) by mouth every 12 (twelve) hours. 10 tablet 0   Oxycodone HCl 10 MG TABS Take 1 tablet (10 mg total) by mouth every 4 (four) hours as needed. 90 tablet 0   potassium  chloride (KLOR-CON M) 10 MEQ tablet Take 1 tablet (10 mEq total) by mouth daily. 30 tablet 0   senna-docusate (SENOKOT-S) 8.6-50 MG tablet Take 1 tablet by mouth 2 (two) times daily. 30 tablet 0   silver sulfADIAZINE (SILVADENE) 1 % cream Apply 1 Application topically daily. 50 g 3   tamsulosin (FLOMAX) 0.4 MG CAPS capsule Take 1 capsule (0.4 mg total) by mouth daily after supper. 30 capsule 2   No current facility-administered medications for this visit.    Allergies: Allergies  Allergen Reactions   Codeine Nausea And Vomiting   Dilaudid [Hydromorphone Hcl] Nausea And Vomiting    Reaction only present for IM administrations. IV and PO admins are effective.    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   with abnormal R wave progression noted on EKG   MRSA (methicillin resistant Staphylococcus aureus)    Pre-diabetes    Rectal carcinoma (HCC) 03/19/2022   Past Surgical History:  Procedure Laterality Date   COLONOSCOPY     HUMERUS IM NAIL Right 04/12/2022   Procedure: INTRAMEDULLARY (IM) NAIL HUMERAL;  Surgeon: Bjorn Pippin, MD;  Location: [facility];  Service: Orthopedics;  Laterality: Right;   IR IMAGING GUIDED PORT INSERTION  04/05/2022   TOTAL HIP ARTHROPLASTY Left 02/06/2022   Procedure: TOTAL HIP ARTHROPLASTY;  Surgeon: Joen Laura, MD;  Location: [facility];  Service: Orthopedics;  Laterality: Left;   Family History  Problem Relation Age of Onset   Stroke Mother    Hypertension Mother    Heart disease Father    Alzheimer's disease Father    Hypertension Brother    Alcohol abuse Brother    Cancer Maternal Aunt    Heart disease Maternal Aunt    Cancer Maternal Uncle    Heart disease Maternal Uncle    Cancer Paternal Aunt    Heart disease Paternal Aunt    Cancer Paternal Uncle    Heart disease Paternal Uncle    Heart disease Paternal Grandmother     Alzheimer's disease Paternal Grandfather    Stomach cancer Neg Hx    Rectal cancer Neg Hx    Esophageal cancer Neg Hx    Colon cancer Neg Hx    Social History   Socioeconomic History   Marital status: Divorced    Spouse name: Not on file   Number of children: Not on file   Years of education: Not on file   Highest education level: Not on file  Occupational History   Not on file  Tobacco Use   Smoking status: Never   Smokeless tobacco: Never  Vaping Use   Vaping status: Never Used  Substance and Sexual Activity   Alcohol use: No   Drug use: No   Sexual activity: Not Currently  Other Topics Concern   Not on file  Social History Narrative   Not on file   Social Drivers of Health   Financial Resource Strain: Not on file  Food Insecurity: No Food Insecurity (02/27/2023)   Hunger Vital Sign    Worried About Running  Out of Food in the Last Year: Never true    Ran Out of Food in the Last Year: Never true  Transportation Needs: No Transportation Needs (02/27/2023)   PRAPARE - Administrator, Civil Service (Medical): No    Lack of Transportation (Non-Medical): No  Physical Activity: Not on file  Stress: Not on file  Social Connections: Not on file  Intimate Partner Violence: Not At Risk (02/27/2023)   Humiliation, Afraid, Rape, and Kick questionnaire    Fear of Current or Ex-Partner: No    Emotionally Abused: No    Physically Abused: No    Sexually Abused: No    SUBJECTIVE  Review of Systems Constitutional: Patient denies any unintentional weight loss or change in strength lntegumentary: Patient denies any rashes or pruritus Cardiovascular: Patient denies chest pain or syncope Respiratory: Patient denies shortness of breath Gastrointestinal: Patient ***denies nausea, vomiting, constipation, or diarrhea Musculoskeletal: Patient denies muscle cramps or weakness Neurologic: Patient denies convulsions or seizures Allergic/Immunologic: Patient denies recent  allergic reaction(s) Hematologic/Lymphatic: Patient denies bleeding tendencies Endocrine: Patient denies heat/cold intolerance  GU: As per HPI.  OBJECTIVE There were no vitals filed for this visit. There is no height or weight on file to calculate BMI.  Physical Examination Constitutional: No obvious distress; patient is non-toxic appearing  Cardiovascular: No visible lower extremity edema.  Respiratory: The patient does not have audible wheezing/stridor; respirations do not appear labored  Gastrointestinal: Abdomen non-distended Musculoskeletal: Normal ROM of UEs  Skin: No obvious rashes/open sores  Neurologic: CN 2-12 grossly intact Psychiatric: Answered questions appropriately with normal affect  Hematologic/Lymphatic/Immunologic: No obvious bruises or sites of spontaneous bleeding  ***GU: Catheter draining ***clear yellow urine.   ASSESSMENT No diagnosis found. *** We discussed pt's urinary retention and possible etiologies including temporary detrusor areflexia, neurogenic bladder, ***BPH, constipation, anticholinergic medication use. Voiding trial was offered.  *** Voiding trial was done and pt was able to successfully empty bladder of the contents instilled. Foley catheter to remain out. Discussion was had about increasing water intake for the next few day to insure good voiding. Pt advised to return if unable to void, or go to ER if after clinic hours.  *** Voiding trial was performed and pt was unable to successfully empty bladder of the contents instilled. Will attempt voiding trial again in 2 weeks. If unable to pass voiding trial at that time, we will pursue further evaluation with urodynamic testing.  ***Foley catheter to remain in place. ***CIC teaching was performed by nursing staff and supplies were given/ordered from [employer]. Pt is aware they will need to perform CIC ***x/day. *** Advised pt to CIC as needed and pt is aware to call if needing to CIC regularly so  we can order supplies for them. *** Topical lidocaine jelly ordered for use as lubricant for comfort; may also be applied to urethra prior to CIC.  We agreed to plan for follow up in *** months or sooner if needed.  Patient verbalized understanding of and agreement with current plan. All questions were answered.  PLAN Advised the following: Foley catheter ***discontinued. ***No follow-ups on file.  No orders of the defined types were placed in this encounter.   It has been explained that the patient is to follow regularly with their PCP in addition to all other providers involved in their care and to follow instructions provided by these respective offices. Patient advised to contact urology clinic if any urologic-pertaining questions, concerns, new symptoms or problems arise in  the interim period.  There are no Patient Instructions on file for this visit.  Electronically signed by:  Donnita Falls, MSN, FNP-C, CUNP 04/08/2023 9:10 AM

## 2023-04-12 DEATH — deceased
